# Patient Record
Sex: Female | Born: 1960
Health system: Southern US, Community
[De-identification: ages and names within clinical notes are randomized; demographics above are authoritative.]

## PROBLEM LIST (undated history)

## (undated) DIAGNOSIS — Z95810 Presence of automatic (implantable) cardiac defibrillator: Secondary | ICD-10-CM

## (undated) DIAGNOSIS — I219 Acute myocardial infarction, unspecified: Secondary | ICD-10-CM

## (undated) DIAGNOSIS — E119 Type 2 diabetes mellitus without complications: Secondary | ICD-10-CM

## (undated) DIAGNOSIS — I10 Essential (primary) hypertension: Secondary | ICD-10-CM

## (undated) DIAGNOSIS — M1990 Unspecified osteoarthritis, unspecified site: Secondary | ICD-10-CM

## (undated) DIAGNOSIS — I2510 Atherosclerotic heart disease of native coronary artery without angina pectoris: Secondary | ICD-10-CM

## (undated) DIAGNOSIS — E7800 Pure hypercholesterolemia, unspecified: Secondary | ICD-10-CM

## (undated) DIAGNOSIS — K589 Irritable bowel syndrome without diarrhea: Secondary | ICD-10-CM

## (undated) DIAGNOSIS — G4730 Sleep apnea, unspecified: Secondary | ICD-10-CM

## (undated) DIAGNOSIS — I38 Endocarditis, valve unspecified: Secondary | ICD-10-CM

## (undated) DIAGNOSIS — I999 Unspecified disorder of circulatory system: Secondary | ICD-10-CM

## (undated) DIAGNOSIS — N183 Chronic kidney disease, stage 3 (moderate): Secondary | ICD-10-CM

## (undated) DIAGNOSIS — K219 Gastro-esophageal reflux disease without esophagitis: Secondary | ICD-10-CM

## (undated) DIAGNOSIS — F329 Major depressive disorder, single episode, unspecified: Secondary | ICD-10-CM

## (undated) DIAGNOSIS — I509 Heart failure, unspecified: Secondary | ICD-10-CM

## (undated) DIAGNOSIS — M109 Gout, unspecified: Secondary | ICD-10-CM

## (undated) HISTORY — PX: CARPAL TUNNEL RELEASE: SHX101

## (undated) HISTORY — DX: Unspecified disorder of circulatory system: I99.9

## (undated) HISTORY — PX: SHOULDER SURGERY: SHX246

## (undated) HISTORY — DX: Major depressive disorder, single episode, unspecified: F32.9

## (undated) HISTORY — PX: ABDOMINAL HYSTERECTOMY: SHX81

## (undated) HISTORY — DX: Atherosclerotic heart disease of native coronary artery without angina pectoris: I25.10

## (undated) HISTORY — DX: Endocarditis, valve unspecified: I38

## (undated) HISTORY — PX: HEEL SPUR EXCISION: SHX1733

## (undated) HISTORY — PX: CARDIAC DEFIBRILLATOR PLACEMENT: SHX171

## (undated) HISTORY — DX: Chronic kidney disease, stage 3 (moderate): N18.3

## (undated) HISTORY — DX: Gastro-esophageal reflux disease without esophagitis: K21.9

## (undated) HISTORY — PX: PACEMAKER GENERATOR CHANGE: SHX5998

## (undated) HISTORY — PX: KNEE SURGERY: SHX244

## (undated) HISTORY — DX: Irritable bowel syndrome without diarrhea: K58.9

---

## 2013-02-07 DIAGNOSIS — J309 Allergic rhinitis, unspecified: Secondary | ICD-10-CM | POA: Insufficient documentation

## 2013-02-07 DIAGNOSIS — E739 Lactose intolerance, unspecified: Secondary | ICD-10-CM | POA: Insufficient documentation

## 2013-02-07 DIAGNOSIS — M2550 Pain in unspecified joint: Secondary | ICD-10-CM | POA: Insufficient documentation

## 2013-03-04 DIAGNOSIS — H33109 Unspecified retinoschisis, unspecified eye: Secondary | ICD-10-CM | POA: Insufficient documentation

## 2013-06-03 DIAGNOSIS — I079 Rheumatic tricuspid valve disease, unspecified: Secondary | ICD-10-CM | POA: Insufficient documentation

## 2013-06-03 DIAGNOSIS — M545 Low back pain, unspecified: Secondary | ICD-10-CM | POA: Insufficient documentation

## 2013-06-03 DIAGNOSIS — I38 Endocarditis, valve unspecified: Secondary | ICD-10-CM

## 2013-06-03 DIAGNOSIS — I379 Nonrheumatic pulmonary valve disorder, unspecified: Secondary | ICD-10-CM | POA: Insufficient documentation

## 2013-06-03 DIAGNOSIS — I059 Rheumatic mitral valve disease, unspecified: Secondary | ICD-10-CM | POA: Insufficient documentation

## 2013-06-03 HISTORY — DX: Endocarditis, valve unspecified: I38

## 2013-07-27 DIAGNOSIS — F411 Generalized anxiety disorder: Secondary | ICD-10-CM | POA: Insufficient documentation

## 2013-07-27 DIAGNOSIS — F329 Major depressive disorder, single episode, unspecified: Secondary | ICD-10-CM | POA: Insufficient documentation

## 2013-07-27 DIAGNOSIS — K219 Gastro-esophageal reflux disease without esophagitis: Secondary | ICD-10-CM | POA: Insufficient documentation

## 2013-07-27 DIAGNOSIS — F32A Depression, unspecified: Secondary | ICD-10-CM

## 2013-07-27 HISTORY — DX: Gastro-esophageal reflux disease without esophagitis: K21.9

## 2013-07-27 HISTORY — DX: Depression, unspecified: F32.A

## 2013-09-01 DIAGNOSIS — I2510 Atherosclerotic heart disease of native coronary artery without angina pectoris: Secondary | ICD-10-CM

## 2013-09-01 HISTORY — DX: Atherosclerotic heart disease of native coronary artery without angina pectoris: I25.10

## 2013-10-13 DIAGNOSIS — G4730 Sleep apnea, unspecified: Secondary | ICD-10-CM

## 2013-10-13 HISTORY — DX: Sleep apnea, unspecified: G47.30

## 2013-11-10 DIAGNOSIS — E1169 Type 2 diabetes mellitus with other specified complication: Secondary | ICD-10-CM | POA: Insufficient documentation

## 2013-11-10 DIAGNOSIS — I129 Hypertensive chronic kidney disease with stage 1 through stage 4 chronic kidney disease, or unspecified chronic kidney disease: Secondary | ICD-10-CM | POA: Insufficient documentation

## 2013-11-10 DIAGNOSIS — E7800 Pure hypercholesterolemia, unspecified: Secondary | ICD-10-CM

## 2013-11-10 DIAGNOSIS — E785 Hyperlipidemia, unspecified: Secondary | ICD-10-CM

## 2013-11-10 DIAGNOSIS — N184 Chronic kidney disease, stage 4 (severe): Secondary | ICD-10-CM

## 2013-11-10 DIAGNOSIS — I10 Essential (primary) hypertension: Secondary | ICD-10-CM

## 2013-11-10 HISTORY — DX: Pure hypercholesterolemia, unspecified: E78.00

## 2013-11-10 HISTORY — DX: Essential (primary) hypertension: I10

## 2013-12-09 DIAGNOSIS — N183 Chronic kidney disease, stage 3 unspecified: Secondary | ICD-10-CM

## 2013-12-09 HISTORY — DX: Chronic kidney disease, stage 3 unspecified: N18.30

## 2013-12-26 DIAGNOSIS — L280 Lichen simplex chronicus: Secondary | ICD-10-CM | POA: Insufficient documentation

## 2013-12-29 DIAGNOSIS — M109 Gout, unspecified: Secondary | ICD-10-CM | POA: Insufficient documentation

## 2013-12-29 DIAGNOSIS — D509 Iron deficiency anemia, unspecified: Secondary | ICD-10-CM | POA: Insufficient documentation

## 2013-12-29 HISTORY — DX: Gout, unspecified: M10.9

## 2014-01-12 DIAGNOSIS — Z86718 Personal history of other venous thrombosis and embolism: Secondary | ICD-10-CM | POA: Insufficient documentation

## 2014-01-12 DIAGNOSIS — I739 Peripheral vascular disease, unspecified: Secondary | ICD-10-CM | POA: Insufficient documentation

## 2014-01-12 DIAGNOSIS — I999 Unspecified disorder of circulatory system: Secondary | ICD-10-CM

## 2014-01-12 HISTORY — DX: Unspecified disorder of circulatory system: I99.9

## 2014-09-27 DIAGNOSIS — E119 Type 2 diabetes mellitus without complications: Secondary | ICD-10-CM

## 2014-09-27 DIAGNOSIS — K589 Irritable bowel syndrome without diarrhea: Secondary | ICD-10-CM

## 2014-09-27 HISTORY — DX: Type 2 diabetes mellitus without complications: E11.9

## 2014-09-27 HISTORY — DX: Irritable bowel syndrome, unspecified: K58.9

## 2015-06-07 DIAGNOSIS — I509 Heart failure, unspecified: Secondary | ICD-10-CM

## 2015-06-07 DIAGNOSIS — I429 Cardiomyopathy, unspecified: Secondary | ICD-10-CM | POA: Insufficient documentation

## 2015-06-07 HISTORY — DX: Heart failure, unspecified: I50.9

## 2015-06-08 DIAGNOSIS — B351 Tinea unguium: Secondary | ICD-10-CM | POA: Insufficient documentation

## 2016-01-25 DIAGNOSIS — Z95810 Presence of automatic (implantable) cardiac defibrillator: Secondary | ICD-10-CM | POA: Insufficient documentation

## 2016-01-25 HISTORY — DX: Presence of automatic (implantable) cardiac defibrillator: Z95.810

## 2016-03-07 DIAGNOSIS — F449 Dissociative and conversion disorder, unspecified: Secondary | ICD-10-CM | POA: Insufficient documentation

## 2016-05-31 DIAGNOSIS — I471 Supraventricular tachycardia: Secondary | ICD-10-CM | POA: Insufficient documentation

## 2016-07-03 DIAGNOSIS — L309 Dermatitis, unspecified: Secondary | ICD-10-CM | POA: Insufficient documentation

## 2016-10-17 DIAGNOSIS — F603 Borderline personality disorder: Secondary | ICD-10-CM | POA: Insufficient documentation

## 2016-10-17 DIAGNOSIS — Z96619 Presence of unspecified artificial shoulder joint: Secondary | ICD-10-CM | POA: Insufficient documentation

## 2016-10-17 DIAGNOSIS — G5600 Carpal tunnel syndrome, unspecified upper limb: Secondary | ICD-10-CM | POA: Insufficient documentation

## 2016-11-15 ENCOUNTER — Emergency Department (HOSPITAL_BASED_OUTPATIENT_CLINIC_OR_DEPARTMENT_OTHER): Payer: Medicare Other

## 2016-11-15 ENCOUNTER — Encounter (HOSPITAL_BASED_OUTPATIENT_CLINIC_OR_DEPARTMENT_OTHER): Payer: Self-pay

## 2016-11-15 ENCOUNTER — Emergency Department (HOSPITAL_BASED_OUTPATIENT_CLINIC_OR_DEPARTMENT_OTHER)
Admission: EM | Admit: 2016-11-15 | Discharge: 2016-11-15 | Payer: Medicare Other | Attending: Emergency Medicine | Admitting: Emergency Medicine

## 2016-11-15 DIAGNOSIS — R0602 Shortness of breath: Secondary | ICD-10-CM | POA: Diagnosis present

## 2016-11-15 DIAGNOSIS — I110 Hypertensive heart disease with heart failure: Secondary | ICD-10-CM | POA: Insufficient documentation

## 2016-11-15 DIAGNOSIS — J181 Lobar pneumonia, unspecified organism: Secondary | ICD-10-CM | POA: Insufficient documentation

## 2016-11-15 DIAGNOSIS — J441 Chronic obstructive pulmonary disease with (acute) exacerbation: Secondary | ICD-10-CM | POA: Insufficient documentation

## 2016-11-15 DIAGNOSIS — I214 Non-ST elevation (NSTEMI) myocardial infarction: Secondary | ICD-10-CM | POA: Diagnosis not present

## 2016-11-15 DIAGNOSIS — I5021 Acute systolic (congestive) heart failure: Secondary | ICD-10-CM | POA: Diagnosis not present

## 2016-11-15 DIAGNOSIS — Z87891 Personal history of nicotine dependence: Secondary | ICD-10-CM | POA: Insufficient documentation

## 2016-11-15 DIAGNOSIS — J189 Pneumonia, unspecified organism: Secondary | ICD-10-CM

## 2016-11-15 DIAGNOSIS — E119 Type 2 diabetes mellitus without complications: Secondary | ICD-10-CM | POA: Diagnosis not present

## 2016-11-15 HISTORY — DX: Unspecified osteoarthritis, unspecified site: M19.90

## 2016-11-15 HISTORY — DX: Sleep apnea, unspecified: G47.30

## 2016-11-15 HISTORY — DX: Essential (primary) hypertension: I10

## 2016-11-15 HISTORY — DX: Heart failure, unspecified: I50.9

## 2016-11-15 HISTORY — DX: Pure hypercholesterolemia, unspecified: E78.00

## 2016-11-15 HISTORY — DX: Type 2 diabetes mellitus without complications: E11.9

## 2016-11-15 HISTORY — DX: Acute myocardial infarction, unspecified: I21.9

## 2016-11-15 LAB — CBC WITH DIFFERENTIAL/PLATELET
BASOS ABS: 0 10*3/uL (ref 0.0–0.1)
Basophils Relative: 0 %
EOS ABS: 0.1 10*3/uL (ref 0.0–0.7)
Eosinophils Relative: 2 %
HCT: 35.8 % — ABNORMAL LOW (ref 36.0–46.0)
Hemoglobin: 11.8 g/dL — ABNORMAL LOW (ref 12.0–15.0)
Lymphocytes Relative: 25 %
Lymphs Abs: 1.8 10*3/uL (ref 0.7–4.0)
MCH: 30.6 pg (ref 26.0–34.0)
MCHC: 33 g/dL (ref 30.0–36.0)
MCV: 92.7 fL (ref 78.0–100.0)
Monocytes Absolute: 0.6 10*3/uL (ref 0.1–1.0)
Monocytes Relative: 8 %
Neutro Abs: 4.5 10*3/uL (ref 1.7–7.7)
Neutrophils Relative %: 65 %
PLATELETS: 267 10*3/uL (ref 150–400)
RBC: 3.86 MIL/uL — AB (ref 3.87–5.11)
RDW: 15.2 % (ref 11.5–15.5)
WBC: 7 10*3/uL (ref 4.0–10.5)

## 2016-11-15 LAB — COMPREHENSIVE METABOLIC PANEL
ALT: 15 U/L (ref 14–54)
AST: 22 U/L (ref 15–41)
Albumin: 3.8 g/dL (ref 3.5–5.0)
Alkaline Phosphatase: 85 U/L (ref 38–126)
Anion gap: 10 (ref 5–15)
BILIRUBIN TOTAL: 0.5 mg/dL (ref 0.3–1.2)
BUN: 18 mg/dL (ref 6–20)
CO2: 23 mmol/L (ref 22–32)
CREATININE: 1.4 mg/dL — AB (ref 0.44–1.00)
Calcium: 9.2 mg/dL (ref 8.9–10.3)
Chloride: 105 mmol/L (ref 101–111)
GFR calc Af Amer: 47 mL/min — ABNORMAL LOW (ref >60)
GFR, EST NON AFRICAN AMERICAN: 41 mL/min — AB (ref >60)
Glucose, Bld: 126 mg/dL — ABNORMAL HIGH (ref 65–99)
POTASSIUM: 3.6 mmol/L (ref 3.5–5.1)
Sodium: 138 mmol/L (ref 135–145)
TOTAL PROTEIN: 7.6 g/dL (ref 6.5–8.1)

## 2016-11-15 LAB — TROPONIN I: TROPONIN I: 0.04 ng/mL — AB (ref <0.03)

## 2016-11-15 LAB — BRAIN NATRIURETIC PEPTIDE: B NATRIURETIC PEPTIDE 5: 451 pg/mL — AB (ref 0.0–100.0)

## 2016-11-15 LAB — D-DIMER, QUANTITATIVE: D-Dimer, Quant: 1.42 ug/mL-FEU — ABNORMAL HIGH (ref 0.00–0.50)

## 2016-11-15 MED ORDER — AZITHROMYCIN 500 MG IV SOLR: 500.0000 mg | Freq: Once | INTRAVENOUS | Status: DC

## 2016-11-15 MED ORDER — AZITHROMYCIN 500 MG IV SOLR
INTRAVENOUS | Status: AC
  Administered 2016-11-15: 500 mg
  Filled 2016-11-15: qty 500

## 2016-11-15 MED ORDER — ALBUTEROL SULFATE (2.5 MG/3ML) 0.083% IN NEBU
5.0000 mg | INHALATION_SOLUTION | Freq: Once | RESPIRATORY_TRACT | Status: AC
  Administered 2016-11-15: 5 mg via RESPIRATORY_TRACT
  Filled 2016-11-15: qty 6

## 2016-11-15 MED ORDER — IOPAMIDOL (ISOVUE-370) INJECTION 76%
100.0000 mL | Freq: Once | INTRAVENOUS | Status: AC | PRN
  Administered 2016-11-15: 80 mL via INTRAVENOUS

## 2016-11-15 MED ORDER — CEFTRIAXONE SODIUM 1 G IJ SOLR
1.0000 g | Freq: Once | INTRAMUSCULAR | Status: AC
  Administered 2016-11-15: 1 g via INTRAVENOUS
  Filled 2016-11-15: qty 10

## 2016-11-15 NOTE — ED Notes (Signed)
O2 applied at 2L [place].

## 2016-11-15 NOTE — ED Triage Notes (Addendum)
Pt presents to triage with fast paced speech, anxious-c/o SOB x 2 months-worse x 2 days-RT in triage to assess pt-pt was encouraged multiple times to give only the information to answer direct ?s to save her breath-pt appears in no resp distress at this as evidenced by her ability for nonstop speech

## 2016-11-15 NOTE — ED Notes (Signed)
Dr. Tamera Punt and pt nurse Vincente Liberty notified of troponin 0.04.

## 2016-11-15 NOTE — ED Notes (Signed)
[facility] is calling back with a bed number for transfer, there.

## 2016-11-15 NOTE — ED Notes (Signed)
Called hospitalist direct for [facility] ---call back to [phone]

## 2016-11-15 NOTE — ED Provider Notes (Signed)
[place] DEPT [facility] Provider Note   CSN: [id number] Arrival date & time: 11/15/16  1319     History   Chief Complaint Chief Complaint  Patient presents with  . Shortness of Breath    HPI Armonie Mettler is a 56 y.o. female.  Patient is a 56 year old female with a history of diabetes, CHF, hyperlipidemia and hypertension who presents with shortness of breath. She reports that she's had worsening shortness breath over the last 2 months. She's had increasing fatigue. She gets short of breath walking short distances. She feels like her abdomen is swollen but denies abdominal pain. Over the last 2 days she's had some nasal congestion and a cough which is mildly productive of white sputum. She denies any known fevers. No nausea vomiting or diarrhea. No increase in her leg swelling. She states when she weighed herself today she was about 3 pounds heavier than she had been. Her cardiologist is in [facility]. She states she previously using nebulizer machine at home but hasn't used it in a couple of years. She also has a prior history of DVT but is not currently on Coumadin.      Past Medical History:  Diagnosis Date  . Arthritis   . CHF (congestive heart failure) ([place])   . Diabetes mellitus without complication ([place])   . High cholesterol   . Hypertension   . Myocardial infarct   . Sleep apnea     There are no active problems to display for this patient.   Past Surgical History:  Procedure Laterality Date  . ABDOMINAL HYSTERECTOMY    . CARDIAC DEFIBRILLATOR PLACEMENT    . CARPAL TUNNEL RELEASE    . HEEL SPUR EXCISION    . KNEE SURGERY    . PACEMAKER GENERATOR CHANGE    . SHOULDER SURGERY      OB History    No data available       Home Medications    Prior to Admission medications   Not on File    Family History No family history on file.  Social History Social History  Substance Use Topics  . Smoking status: Former Research scientist (life sciences)  . Smokeless tobacco: Never Used   . Alcohol use No     Allergies   Patient has no allergy information on record.   Review of Systems Review of Systems  Constitutional: Positive for fatigue. Negative for chills, diaphoresis and fever.  HENT: Positive for congestion and rhinorrhea. Negative for sneezing.   Eyes: Negative.   Respiratory: Positive for cough and shortness of breath. Negative for chest tightness.   Cardiovascular: Negative for chest pain and leg swelling.  Gastrointestinal: Negative for abdominal pain, blood in stool, diarrhea, nausea and vomiting.  Genitourinary: Negative for difficulty urinating, flank pain, frequency and hematuria.  Musculoskeletal: Negative for arthralgias and back pain.  Skin: Negative for rash.  Neurological: Negative for dizziness, speech difficulty, weakness, numbness and headaches.     Physical Exam Updated Vital Signs BP (!) 143/88   Pulse 75   Temp 98.3 F (36.8 C) (Oral)   Resp 17   SpO2 96%   Physical Exam  Constitutional: She is oriented to person, place, and time. She appears well-developed and well-nourished.  HENT:  Head: Normocephalic and atraumatic.  Eyes: Pupils are equal, round, and reactive to light.  Neck: Normal range of motion. Neck supple.  Cardiovascular: Regular rhythm and normal heart sounds.  Tachycardia present.   Pulmonary/Chest: Effort normal. Tachypnea noted. No respiratory distress. She has wheezes.  She has rales. She exhibits no tenderness.  Abdominal: Soft. Bowel sounds are normal. There is no tenderness. There is no rebound and no guarding.  Musculoskeletal: Normal range of motion. She exhibits no edema.  Lymphadenopathy:    She has no cervical adenopathy.  Neurological: She is alert and oriented to person, place, and time.  Skin: Skin is warm and dry. No rash noted.  Psychiatric: She has a normal mood and affect.     ED Treatments / Results  Labs (all labs ordered are listed, but only abnormal results are displayed) Labs Reviewed   COMPREHENSIVE METABOLIC PANEL - Abnormal; Notable for the following:       Result Value   Glucose, Bld 126 (*)    Creatinine, Ser 1.40 (*)    GFR calc non Af Amer 41 (*)    GFR calc Af Amer 47 (*)    All other components within normal limits  CBC WITH DIFFERENTIAL/PLATELET - Abnormal; Notable for the following:    RBC 3.86 (*)    Hemoglobin 11.8 (*)    HCT 35.8 (*)    All other components within normal limits  TROPONIN I - Abnormal; Notable for the following:    Troponin I 0.04 (*)    All other components within normal limits  BRAIN NATRIURETIC PEPTIDE - Abnormal; Notable for the following:    B Natriuretic Peptide 451.0 (*)    All other components within normal limits  D-DIMER, QUANTITATIVE (NOT AT [facility]) - Abnormal; Notable for the following:    D-Dimer, Quant 1.42 (*)    All other components within normal limits    EKG  EKG Interpretation  Date/Time:  Thursday November 15 2016 13:42:27 EDT Ventricular Rate:  101 PR Interval:    QRS Duration: 137 QT Interval:  402 QTC Calculation: 522 R Axis:   -34 Text Interpretation:  Atrial-sensed ventricular-paced rhythm No further analysis attempted due to paced rhythm No old tracing to compare Confirmed by Chyanne Kohut  MD, Raynah Gomes ([id number]) on 11/15/2016 1:47:31 PM       Radiology Dg Chest 2 View  Result Date: 11/15/2016 CLINICAL DATA:  Shortness of breath for the past several days. History of CHF. Former smoker. EXAM: CHEST  2 VIEW COMPARISON:  None in PACs FINDINGS: The lungs are adequately inflated. There are patchy alveolar opacities in the right mid and lower lung. The left lung is grossly clear but a portion is obscured by the ICD generator. There is no pleural effusion. The cardiac silhouette is enlarged. The central pulmonary vascularity is prominent and there is mild cephalization of the vascular pattern. The ICD is in reasonable position. The bony thorax exhibits no acute abnormality. There are prosthetic shoulder joints bilaterally.  IMPRESSION: Patchy alveolar opacities in the right mid and lower lung worrisome for pneumonia. Probable low-grade CHF. Followup PA and lateral chest X-ray is recommended in 3-4 weeks following trial of antibiotic therapy to ensure resolution and exclude underlying malignancy. Electronically Signed   By: David  Martinique M.D.   On: 11/15/2016 14:43    Procedures Procedures (including critical care time)  Medications Ordered in ED Medications  cefTRIAXone (ROCEPHIN) 1 g in dextrose 5 % 50 mL IVPB (not administered)  azithromycin (ZITHROMAX) 500 mg in dextrose 5 % 250 mL IVPB (not administered)  azithromycin (ZITHROMAX) 500 MG injection (not administered)  albuterol (PROVENTIL) (2.5 MG/3ML) 0.083% nebulizer solution 5 mg (5 mg Nebulization Given 11/15/16 1409)     Initial Impression / Assessment and Plan / ED Course  I have reviewed the triage vital signs and the nursing notes.  Pertinent labs & imaging results that were available during my care of the patient were reviewed by me and considered in my medical decision making (see chart for details).     PT presents with SOB Over last 2 months but worsening over last few days. She's also had some worsening cough and URI symptoms over the last few days. Chest x-ray shows bilateral patchy infiltrates. She has no recent hospitalizations. She was treated with Rocephin and Zithromax in the ED. She also is given nebulizer treatment and her breathing seems to be much improved after the nebulizer treatment. She has a mild oxygen requirement. She does not report any chest pain. She doesn't have any visible ischemic changes on EKG although she does have a paced rhythm. Her troponin is mildly bumps. Her d-dimer is elevated and she is currently waiting CT scan.  She will need admission for further tx.  PT requesting [place].  Dr. Rex Kras to f/u on CT and likely admit.  Final Clinical Impressions(s) / ED Diagnoses   Final diagnoses:  Community acquired pneumonia of  left lung, unspecified part of lung  Community acquired pneumonia of right lower lobe of lung (HCC)  COPD exacerbation ([place])  Acute systolic congestive heart failure (HCC)  NSTEMI (non-ST elevated myocardial infarction) [facility])    New Prescriptions New Prescriptions   No medications on file     Malvin Johns, MD 11/15/16 1614

## 2016-11-15 NOTE — ED Notes (Signed)
Discussed allergy to PCN w/ EDP; EDP wants to proceed w/ Rocephin d/t no hx of anaphylaxis

## 2016-11-15 NOTE — ED Notes (Signed)
Updated pt on status of transfer; still no truck available. Soup provided to eat. Pt requests to try no O2 to see how she does. O2 removed at this time.

## 2016-11-15 NOTE — ED Notes (Signed)
[facility] can transport sometime after 7PM to [facility]

## 2016-11-15 NOTE — ED Notes (Signed)
[facility] transport not available until after 7p.

## 2016-11-16 DIAGNOSIS — E119 Type 2 diabetes mellitus without complications: Secondary | ICD-10-CM | POA: Insufficient documentation

## 2017-02-08 DIAGNOSIS — M11261 Other chondrocalcinosis, right knee: Secondary | ICD-10-CM | POA: Insufficient documentation

## 2017-02-08 DIAGNOSIS — M170 Bilateral primary osteoarthritis of knee: Secondary | ICD-10-CM | POA: Insufficient documentation

## 2017-02-08 DIAGNOSIS — M1990 Unspecified osteoarthritis, unspecified site: Secondary | ICD-10-CM

## 2017-02-08 DIAGNOSIS — M11262 Other chondrocalcinosis, left knee: Secondary | ICD-10-CM

## 2017-02-08 HISTORY — DX: Unspecified osteoarthritis, unspecified site: M19.90

## 2017-02-09 ENCOUNTER — Observation Stay (HOSPITAL_COMMUNITY): Payer: Medicare Other

## 2017-02-09 ENCOUNTER — Encounter (HOSPITAL_BASED_OUTPATIENT_CLINIC_OR_DEPARTMENT_OTHER): Payer: Self-pay | Admitting: Emergency Medicine

## 2017-02-09 ENCOUNTER — Emergency Department (HOSPITAL_BASED_OUTPATIENT_CLINIC_OR_DEPARTMENT_OTHER): Payer: Medicare Other

## 2017-02-09 ENCOUNTER — Inpatient Hospital Stay (HOSPITAL_BASED_OUTPATIENT_CLINIC_OR_DEPARTMENT_OTHER)
Admission: EM | Admit: 2017-02-09 | Discharge: 2017-02-15 | DRG: 286 | Disposition: A | Discharge status: Home / Self Care | Payer: Medicare Other | Attending: Family Medicine | Admitting: Family Medicine

## 2017-02-09 DIAGNOSIS — M109 Gout, unspecified: Secondary | ICD-10-CM | POA: Diagnosis present

## 2017-02-09 DIAGNOSIS — I130 Hypertensive heart and chronic kidney disease with heart failure and stage 1 through stage 4 chronic kidney disease, or unspecified chronic kidney disease: Secondary | ICD-10-CM | POA: Diagnosis not present

## 2017-02-09 DIAGNOSIS — K219 Gastro-esophageal reflux disease without esophagitis: Secondary | ICD-10-CM | POA: Diagnosis present

## 2017-02-09 DIAGNOSIS — I10 Essential (primary) hypertension: Secondary | ICD-10-CM | POA: Diagnosis not present

## 2017-02-09 DIAGNOSIS — R0989 Other specified symptoms and signs involving the circulatory and respiratory systems: Secondary | ICD-10-CM | POA: Diagnosis not present

## 2017-02-09 DIAGNOSIS — Z95810 Presence of automatic (implantable) cardiac defibrillator: Secondary | ICD-10-CM

## 2017-02-09 DIAGNOSIS — I252 Old myocardial infarction: Secondary | ICD-10-CM

## 2017-02-09 DIAGNOSIS — E785 Hyperlipidemia, unspecified: Secondary | ICD-10-CM | POA: Diagnosis present

## 2017-02-09 DIAGNOSIS — Z79899 Other long term (current) drug therapy: Secondary | ICD-10-CM

## 2017-02-09 DIAGNOSIS — G4730 Sleep apnea, unspecified: Secondary | ICD-10-CM | POA: Diagnosis present

## 2017-02-09 DIAGNOSIS — G894 Chronic pain syndrome: Secondary | ICD-10-CM | POA: Diagnosis present

## 2017-02-09 DIAGNOSIS — D509 Iron deficiency anemia, unspecified: Secondary | ICD-10-CM | POA: Diagnosis present

## 2017-02-09 DIAGNOSIS — E1165 Type 2 diabetes mellitus with hyperglycemia: Secondary | ICD-10-CM | POA: Diagnosis present

## 2017-02-09 DIAGNOSIS — Z87891 Personal history of nicotine dependence: Secondary | ICD-10-CM

## 2017-02-09 DIAGNOSIS — I509 Heart failure, unspecified: Secondary | ICD-10-CM

## 2017-02-09 DIAGNOSIS — I429 Cardiomyopathy, unspecified: Secondary | ICD-10-CM | POA: Diagnosis present

## 2017-02-09 DIAGNOSIS — F39 Unspecified mood [affective] disorder: Secondary | ICD-10-CM | POA: Diagnosis present

## 2017-02-09 DIAGNOSIS — R778 Other specified abnormalities of plasma proteins: Secondary | ICD-10-CM

## 2017-02-09 DIAGNOSIS — N179 Acute kidney failure, unspecified: Secondary | ICD-10-CM | POA: Diagnosis present

## 2017-02-09 DIAGNOSIS — E1122 Type 2 diabetes mellitus with diabetic chronic kidney disease: Secondary | ICD-10-CM | POA: Diagnosis present

## 2017-02-09 DIAGNOSIS — R748 Abnormal levels of other serum enzymes: Secondary | ICD-10-CM | POA: Diagnosis not present

## 2017-02-09 DIAGNOSIS — K5900 Constipation, unspecified: Secondary | ICD-10-CM

## 2017-02-09 DIAGNOSIS — E876 Hypokalemia: Secondary | ICD-10-CM | POA: Diagnosis present

## 2017-02-09 DIAGNOSIS — N183 Chronic kidney disease, stage 3 (moderate): Secondary | ICD-10-CM | POA: Diagnosis present

## 2017-02-09 DIAGNOSIS — R0600 Dyspnea, unspecified: Secondary | ICD-10-CM

## 2017-02-09 DIAGNOSIS — Z880 Allergy status to penicillin: Secondary | ICD-10-CM

## 2017-02-09 DIAGNOSIS — G4733 Obstructive sleep apnea (adult) (pediatric): Secondary | ICD-10-CM | POA: Diagnosis present

## 2017-02-09 DIAGNOSIS — I5023 Acute on chronic systolic (congestive) heart failure: Secondary | ICD-10-CM

## 2017-02-09 DIAGNOSIS — R7989 Other specified abnormal findings of blood chemistry: Secondary | ICD-10-CM

## 2017-02-09 DIAGNOSIS — Z888 Allergy status to other drugs, medicaments and biological substances status: Secondary | ICD-10-CM

## 2017-02-09 DIAGNOSIS — G2581 Restless legs syndrome: Secondary | ICD-10-CM | POA: Diagnosis present

## 2017-02-09 HISTORY — DX: Presence of automatic (implantable) cardiac defibrillator: Z95.810

## 2017-02-09 HISTORY — DX: Gout, unspecified: M10.9

## 2017-02-09 LAB — HEPATIC FUNCTION PANEL
ALK PHOS: 67 U/L (ref 38–126)
ALT: 16 U/L (ref 14–54)
AST: 24 U/L (ref 15–41)
Albumin: 3.7 g/dL (ref 3.5–5.0)
Bilirubin, Direct: <0.1 mg/dL — ABNORMAL LOW (ref 0.1–0.5)
TOTAL PROTEIN: 7.3 g/dL (ref 6.5–8.1)
Total Bilirubin: 0.7 mg/dL (ref 0.3–1.2)

## 2017-02-09 LAB — LIPASE, BLOOD: LIPASE: 18 U/L (ref 11–51)

## 2017-02-09 LAB — BASIC METABOLIC PANEL
ANION GAP: 8 (ref 5–15)
BUN: 16 mg/dL (ref 6–20)
CALCIUM: 9 mg/dL (ref 8.9–10.3)
CO2: 25 mmol/L (ref 22–32)
Chloride: 106 mmol/L (ref 101–111)
Creatinine, Ser: 1.44 mg/dL — ABNORMAL HIGH (ref 0.44–1.00)
GFR, EST AFRICAN AMERICAN: 45 mL/min — AB (ref >60)
GFR, EST NON AFRICAN AMERICAN: 39 mL/min — AB (ref >60)
Glucose, Bld: 136 mg/dL — ABNORMAL HIGH (ref 65–99)
Potassium: 3.9 mmol/L (ref 3.5–5.1)
SODIUM: 139 mmol/L (ref 135–145)

## 2017-02-09 LAB — TROPONIN I
TROPONIN I: 0.04 ng/mL — AB (ref <0.03)
TROPONIN I: 0.04 ng/mL — AB (ref <0.03)
TROPONIN I: 0.04 ng/mL — AB (ref <0.03)

## 2017-02-09 LAB — CBC WITH DIFFERENTIAL/PLATELET
BASOS PCT: 0 %
Basophils Absolute: 0 10*3/uL (ref 0.0–0.1)
EOS PCT: 3 %
Eosinophils Absolute: 0.1 10*3/uL (ref 0.0–0.7)
HCT: 31.8 % — ABNORMAL LOW (ref 36.0–46.0)
Hemoglobin: 10.6 g/dL — ABNORMAL LOW (ref 12.0–15.0)
Lymphocytes Relative: 20 %
Lymphs Abs: 1 10*3/uL (ref 0.7–4.0)
MCH: 31.1 pg (ref 26.0–34.0)
MCHC: 33.3 g/dL (ref 30.0–36.0)
MCV: 93.3 fL (ref 78.0–100.0)
MONO ABS: 0.4 10*3/uL (ref 0.1–1.0)
Monocytes Relative: 7 %
NEUTROS ABS: 3.6 10*3/uL (ref 1.7–7.7)
Neutrophils Relative %: 70 %
PLATELETS: 289 10*3/uL (ref 150–400)
RBC: 3.41 MIL/uL — AB (ref 3.87–5.11)
RDW: 15.7 % — AB (ref 11.5–15.5)
WBC: 5.1 10*3/uL (ref 4.0–10.5)

## 2017-02-09 LAB — CBC
HEMATOCRIT: 33.6 % — AB (ref 36.0–46.0)
HEMOGLOBIN: 10.8 g/dL — AB (ref 12.0–15.0)
MCH: 29.9 pg (ref 26.0–34.0)
MCHC: 32.1 g/dL (ref 30.0–36.0)
MCV: 93.1 fL (ref 78.0–100.0)
Platelets: 315 10*3/uL (ref 150–400)
RBC: 3.61 MIL/uL — AB (ref 3.87–5.11)
RDW: 16.1 % — ABNORMAL HIGH (ref 11.5–15.5)
WBC: 6.6 10*3/uL (ref 4.0–10.5)

## 2017-02-09 LAB — GLUCOSE, CAPILLARY
GLUCOSE-CAPILLARY: 111 mg/dL — AB (ref 65–99)
Glucose-Capillary: 132 mg/dL — ABNORMAL HIGH (ref 65–99)

## 2017-02-09 LAB — CREATININE, SERUM
CREATININE: 1.38 mg/dL — AB (ref 0.44–1.00)
GFR calc Af Amer: 48 mL/min — ABNORMAL LOW (ref >60)
GFR, EST NON AFRICAN AMERICAN: 41 mL/min — AB (ref >60)

## 2017-02-09 LAB — BRAIN NATRIURETIC PEPTIDE: B NATRIURETIC PEPTIDE 5: 634.3 pg/mL — AB (ref 0.0–100.0)

## 2017-02-09 LAB — MAGNESIUM: MAGNESIUM: 1.8 mg/dL (ref 1.7–2.4)

## 2017-02-09 MED ORDER — ROPINIROLE HCL 0.5 MG PO TABS
0.5000 mg | ORAL_TABLET | Freq: Every day | ORAL | Status: DC
  Administered 2017-02-09 – 2017-02-14 (×6): 0.5 mg via ORAL
  Filled 2017-02-09 (×7): qty 1

## 2017-02-09 MED ORDER — SIMVASTATIN 20 MG PO TABS
20.0000 mg | ORAL_TABLET | Freq: Every day | ORAL | Status: DC
  Administered 2017-02-09 – 2017-02-14 (×5): 20 mg via ORAL
  Filled 2017-02-09 (×7): qty 1

## 2017-02-09 MED ORDER — INSULIN ASPART 100 UNIT/ML ~~LOC~~ SOLN
0.0000 [IU] | Freq: Three times a day (TID) | SUBCUTANEOUS | Status: DC
  Administered 2017-02-10: 2 [IU] via SUBCUTANEOUS
  Administered 2017-02-10: 3 [IU] via SUBCUTANEOUS
  Administered 2017-02-11 – 2017-02-12 (×4): 2 [IU] via SUBCUTANEOUS
  Administered 2017-02-12: 3 [IU] via SUBCUTANEOUS
  Administered 2017-02-13: 2 [IU] via SUBCUTANEOUS
  Administered 2017-02-14: 8 [IU] via SUBCUTANEOUS
  Administered 2017-02-15: 5 [IU] via SUBCUTANEOUS

## 2017-02-09 MED ORDER — SERTRALINE HCL 100 MG PO TABS
150.0000 mg | ORAL_TABLET | Freq: Every day | ORAL | Status: DC
  Administered 2017-02-09 – 2017-02-15 (×7): 150 mg via ORAL
  Filled 2017-02-09 (×7): qty 1

## 2017-02-09 MED ORDER — SODIUM CHLORIDE 0.9% FLUSH
3.0000 mL | INTRAVENOUS | Status: DC | PRN
  Administered 2017-02-13: 3 mL via INTRAVENOUS
  Filled 2017-02-09: qty 3

## 2017-02-09 MED ORDER — ASPIRIN 81 MG PO CHEW
324.0000 mg | CHEWABLE_TABLET | Freq: Once | ORAL | Status: AC
  Administered 2017-02-09: 324 mg via ORAL
  Filled 2017-02-09: qty 4

## 2017-02-09 MED ORDER — SODIUM CHLORIDE 0.9% FLUSH
3.0000 mL | Freq: Two times a day (BID) | INTRAVENOUS | Status: DC
  Administered 2017-02-09 – 2017-02-13 (×8): 3 mL via INTRAVENOUS

## 2017-02-09 MED ORDER — SPIRONOLACTONE 25 MG PO TABS
25.0000 mg | ORAL_TABLET | Freq: Every day | ORAL | Status: DC
  Administered 2017-02-09 – 2017-02-14 (×6): 25 mg via ORAL
  Filled 2017-02-09 (×6): qty 1

## 2017-02-09 MED ORDER — HYDRALAZINE HCL 20 MG/ML IJ SOLN
10.0000 mg | Freq: Three times a day (TID) | INTRAMUSCULAR | Status: DC | PRN
  Filled 2017-02-09: qty 1

## 2017-02-09 MED ORDER — CARVEDILOL 25 MG PO TABS
25.0000 mg | ORAL_TABLET | Freq: Two times a day (BID) | ORAL | Status: DC
  Administered 2017-02-10 – 2017-02-14 (×8): 25 mg via ORAL
  Filled 2017-02-09 (×10): qty 1

## 2017-02-09 MED ORDER — SODIUM CHLORIDE 0.9 % IV SOLN: 250.0000 mL | INTRAVENOUS | Status: DC | PRN

## 2017-02-09 MED ORDER — AMIODARONE HCL 200 MG PO TABS
200.0000 mg | ORAL_TABLET | Freq: Every day | ORAL | Status: DC
Start: 2017-02-09 — End: 2017-02-15
  Administered 2017-02-09 – 2017-02-15 (×7): 200 mg via ORAL
  Filled 2017-02-09 (×7): qty 1

## 2017-02-09 MED ORDER — ONDANSETRON HCL 4 MG/2ML IJ SOLN: 4.0000 mg | Freq: Four times a day (QID) | INTRAMUSCULAR | Status: DC | PRN

## 2017-02-09 MED ORDER — FUROSEMIDE 10 MG/ML IJ SOLN
60.0000 mg | Freq: Two times a day (BID) | INTRAMUSCULAR | Status: AC
  Administered 2017-02-09 – 2017-02-10 (×2): 60 mg via INTRAVENOUS
  Filled 2017-02-09 (×2): qty 6

## 2017-02-09 MED ORDER — FUROSEMIDE 10 MG/ML IJ SOLN
40.0000 mg | INTRAMUSCULAR | Status: AC
  Administered 2017-02-09: 40 mg via INTRAVENOUS
  Filled 2017-02-09: qty 4

## 2017-02-09 MED ORDER — PANTOPRAZOLE SODIUM 40 MG PO TBEC
40.0000 mg | DELAYED_RELEASE_TABLET | Freq: Every day | ORAL | Status: DC
  Administered 2017-02-09 – 2017-02-15 (×7): 40 mg via ORAL
  Filled 2017-02-09 (×7): qty 1

## 2017-02-09 MED ORDER — HYDRALAZINE HCL 25 MG PO TABS
12.5000 mg | ORAL_TABLET | Freq: Every day | ORAL | Status: DC
  Administered 2017-02-09 – 2017-02-10 (×2): 12.5 mg via ORAL
  Filled 2017-02-09 (×2): qty 1

## 2017-02-09 MED ORDER — ENOXAPARIN SODIUM 40 MG/0.4ML ~~LOC~~ SOLN
40.0000 mg | SUBCUTANEOUS | Status: DC
  Administered 2017-02-09 – 2017-02-12 (×4): 40 mg via SUBCUTANEOUS
  Filled 2017-02-09 (×7): qty 0.4

## 2017-02-09 MED ORDER — ACETAMINOPHEN 325 MG PO TABS: 650.0000 mg | ORAL_TABLET | ORAL | Status: DC | PRN

## 2017-02-09 MED ORDER — POTASSIUM CHLORIDE CRYS ER 10 MEQ PO TBCR
10.0000 meq | EXTENDED_RELEASE_TABLET | ORAL | Status: DC
  Administered 2017-02-09: 10 meq via ORAL
  Filled 2017-02-09: qty 1

## 2017-02-09 NOTE — ED Provider Notes (Signed)
[place] DEPT [facility] Provider Note   CSN: [id number] Arrival date & time: 02/09/17  1115     History   Chief Complaint Chief Complaint  Patient presents with  . Abdominal Pain  . Shortness of Breath    HPI Michelle Andrade is a 56 y.o. female.  The history is provided by the patient and medical records.    56 year old F with hx of CHF, arthritis, HTN, HLP, gout, sleep apnea, DM, presenting to the ED for SOB And epigastric abdominal pain.  Patient reports she has had a hiatal hernia for several years now. States feels it is getting larger. States she has met with her physician at the [place] who has arranged for her to see a Psychologist, sport and exercise, however states sheis not in physical shape to undergo operative repair at this time. Reports over the past 2 days she has become increasingly short of breath. States since her diagnosis of CHF she sleeps in a recliner nightly. States she is able to recline back somewhat, but has not been able to lay flat since diagnosis. States she does have history of sleep apnea, but has had trouble using her CPAP recently. She has been having some nasal congestion, productive cough with white, frothy sputum. She denies any chest pain. She's not had any fever to her knowledge, but does have hot flashes so states it is hard to distinguish between the 2. States she's been taking her diuretics as prescribed. States she's been checking her weight, was 177 earlier in the week, 178 this morning. States she just recently got over bronchitis.  She's not had any trouble eating or drinking. She is not had any vomiting or diarrhea.  Past Medical History:  Diagnosis Date  . Arthritis   . CHF (congestive heart failure) ([place])   . Diabetes mellitus without complication ([place])   . Gout   . High cholesterol   . Hypertension   . Myocardial infarct ([place])   . Sleep apnea     There are no active problems to display for this patient.   Past Surgical History:  Procedure Laterality Date  .  ABDOMINAL HYSTERECTOMY    . CARDIAC DEFIBRILLATOR PLACEMENT    . CARPAL TUNNEL RELEASE    . HEEL SPUR EXCISION    . KNEE SURGERY    . PACEMAKER GENERATOR CHANGE    . SHOULDER SURGERY      OB History    No data available       Home Medications    Prior to Admission medications   Medication Sig Start Date End Date Taking? Authorizing Provider  amiodarone (PACERONE) 200 MG tablet Take 200 mg by mouth daily.   Yes [provider]  carvedilol (COREG) 25 MG tablet Take 25 mg by mouth 2 (two) times daily with a meal.   Yes [provider]  ferrous sulfate 325 (65 FE) MG tablet Take 325 mg by mouth daily with breakfast.   Yes [provider]  hydrALAZINE (APRESOLINE) 25 MG tablet Take 25 mg by mouth 3 (three) times daily.   Yes [provider]  loratadine (CLARITIN) 10 MG tablet Take 10 mg by mouth daily.   Yes [provider]  metFORMIN (GLUCOPHAGE) 500 MG tablet Take by mouth 2 (two) times daily with a meal.   Yes [provider]  omeprazole (PRILOSEC) 20 MG capsule Take 20 mg by mouth daily.   Yes [provider]  potassium chloride (K-DUR,KLOR-CON) 10 MEQ tablet Take 10 mEq by mouth 2 (  two) times daily.   Yes [provider]  rOPINIRole (REQUIP) 0.5 MG tablet Take 0.5 mg by mouth 3 (three) times daily.   Yes [provider]  sertraline (ZOLOFT) 50 MG tablet Take 50 mg by mouth daily.   Yes [provider]  simvastatin (ZOCOR) 20 MG tablet Take 20 mg by mouth daily.   Yes [provider]  spironolactone (ALDACTONE) 25 MG tablet Take 25 mg by mouth daily.   Yes [provider]  torsemide (DEMADEX) 20 MG tablet Take 20 mg by mouth daily.   Yes [provider]    Family History No family history on file.  Social History Social History  Substance Use Topics  . Smoking status: Former Research scientist (life sciences)  . Smokeless tobacco: Never Used  . Alcohol use No     Allergies   Codeine;  Gabapentin; Haldol [haloperidol lactate]; Lisinopril; Losartan; Morphine and related; and Penicillins   Review of Systems Review of Systems  Respiratory: Positive for cough and shortness of breath.   Gastrointestinal: Positive for abdominal pain.  All other systems reviewed and are negative.    Physical Exam Updated Vital Signs BP (!) 161/113 (BP Location: Left Arm)   Pulse (!) 110   Temp 98.8 F (37.1 C) (Oral)   Resp (!) 28   Ht 5' 3"  (1.6 m)   Wt 80.7 kg (178 lb)   SpO2 96%   BMI 31.53 kg/m   Physical Exam  Constitutional: She is oriented to person, place, and time. She appears well-developed and well-nourished.  HENT:  Head: Normocephalic and atraumatic.  Mouth/Throat: Oropharynx is clear and moist.  Voice does sounds somewhat hoarse but HEENT WNL  Eyes: Conjunctivae and EOM are normal. Pupils are equal, round, and reactive to light.  Neck: Normal range of motion.  Cardiovascular: Normal rate, regular rhythm and normal heart sounds.   Pulmonary/Chest: Effort normal and breath sounds normal. No respiratory distress. She has no wheezes.  No wheezes, rhonchi, or rales noted; patient is speaking in paragraphs without issue  Abdominal: Soft. Bowel sounds are normal. There is no tenderness. There is no rebound.  Musculoskeletal: Normal range of motion.  No significant pitting edema of the lower extremities  Neurological: She is alert and oriented to person, place, and time.  Skin: Skin is warm and dry.  Psychiatric: She has a normal mood and affect.  Nursing note and vitals reviewed.    ED Treatments / Results  Labs (all labs ordered are listed, but only abnormal results are displayed) Labs Reviewed  CBC WITH DIFFERENTIAL/PLATELET - Abnormal; Notable for the following:       Result Value   RBC 3.41 (*)    Hemoglobin 10.6 (*)    HCT 31.8 (*)    RDW 15.7 (*)    All other components within normal limits  BASIC METABOLIC PANEL - Abnormal; Notable for the following:      Glucose, Bld 136 (*)    Creatinine, Ser 1.44 (*)    GFR calc non Af Amer 39 (*)    GFR calc Af Amer 45 (*)    All other components within normal limits  TROPONIN I - Abnormal; Notable for the following:    Troponin I 0.04 (*)    All other components within normal limits  BRAIN NATRIURETIC PEPTIDE - Abnormal; Notable for the following:    B Natriuretic Peptide 634.3 (*)    All other components within normal limits  HEPATIC FUNCTION PANEL - Abnormal; Notable for the  following:    Bilirubin, Direct <0.1 (*)    All other components within normal limits  LIPASE, BLOOD    EKG  EKG Interpretation  Date/Time:  Saturday February 09 2017 11:43:33 EDT Ventricular Rate:  81 PR Interval:    QRS Duration: 106 QT Interval:  425 QTC Calculation: 494 R Axis:   63 Text Interpretation:  Atrial-sensed ventricular-paced rhythm No further analysis attempted due to paced rhythm No significant change since last tracing Confirmed by Dorie Rank [phone]) on 02/09/2017 11:51:57 AM       Radiology Dg Chest 2 View  Result Date: 02/09/2017 CLINICAL DATA:  Shortness of Breath EXAM: CHEST  2 VIEW COMPARISON:  Chest radiograph November 15, 2016 and chest CT November 15, 2016 FINDINGS: The areas of patchy airspace opacity noted previously have resolve. There is currently no airspace consolidation or appreciable edema. There is cardiomegaly with pulmonary venous hypertension. Pacemaker leads are attached to the right atrium, right ventricle, and coronary sinus. There are total shoulder replacements bilaterally. No bone lesions. No adenopathy. IMPRESSION: Pulmonary vascular congestion without frank edema or consolidation. Electronically Signed   By: Lowella Grip III M.D.   On: 02/09/2017 12:06    Procedures Procedures (including critical care time)  Medications Ordered in ED Medications - No data to display   Initial Impression / Assessment and Plan / ED Course  I have reviewed the triage vital signs and the  nursing notes.  Pertinent labs & imaging results that were available during my care of the patient were reviewed by me and considered in my medical decision making (see chart for details).  56 year old female here with worsening shortness of breath and abdominal pain. Has a known hiatal hernia is been bothering her. Shortness of breath has been worsening recently. Reports recent bout of bronchitis. Here she is afebrile and nontoxic. Her lungs are overall clear. She is in no acute distress. Vitals are stable. Lab work with elevated troponin at 0.04 and elevated BNP at 634.4. There is vascular congestion noted on her chest x-ray but no overt edema or infiltrate. Suspect troponin elevation secondary to demand ischemia. She denies any chest pain here. EKG is unchanged from prior. Patient given aspirin and additional Lasix. She will be admitted to hospitalist service.  Discussed with hospitalist-- Dr. Aggie Moats, will admit for tele/obs.    Final Clinical Impressions(s) / ED Diagnoses   Final diagnoses:  Congestive heart failure, unspecified HF chronicity, unspecified heart failure type ([place])  Elevated troponin    New Prescriptions New Prescriptions   No medications on file     Larene Pickett, PA-C 02/09/17 1413    Dorie Rank, MD 02/10/17 [phone]

## 2017-02-09 NOTE — ED Triage Notes (Signed)
Upper abd pain and bloating with SOB. Symptoms have been going on for awhile. Hx of hiatal hernia and CHF. Pt states she was recently hospitalized for same pt symptoms are worsening.

## 2017-02-09 NOTE — Progress Notes (Signed)
B/P 141/79 Hydralazine PRN not given orders parameters not met.

## 2017-02-09 NOTE — ED Notes (Signed)
Pt refused wheelchair. Ambulated unassisted a short distance to BR. Steady gait.

## 2017-02-09 NOTE — H&P (Signed)
[facility] History and Physical  Michelle Andrade SWF:[id number] DOB: 04/23/1961 DOA: 02/09/2017  Referring physician:  PCP: Henderson Baltimore, [name]   Chief Complaint:sob  HPI: Michelle Andrade is a 56 y.o. female  history significant for CHF, diabetes, gout, hypertension, heart attack, sleep apnea who presents with shortness breath. Patient states that she is slowly begun more short of breath over the last week. No medication changes. Has adhered to taking her medications. Is unsure what her dry weight is. Patient states she is very anxious and she thinks is making her shortness of breath worse. Also thinks that her daily bloatedness related to her shortness breath. Denies any chest pain, nausea, vomiting and diaphoresis.  ED Course: trop 0.04, elevated bnp, no CP, given lasix and asa, CXR show vascular congestion Patient has been diuresing but is still very dyspneic on exam per the EDP  Review of Systems:  As per HPI otherwise 10 point review of systems negative.    Past Medical History:  Diagnosis Date  . Arthritis   . CHF (congestive heart failure) ([place])   . Diabetes mellitus without complication ([place])   . Gout   . High cholesterol   . Hypertension   . Myocardial infarct ([place])   . Sleep apnea    Past Surgical History:  Procedure Laterality Date  . ABDOMINAL HYSTERECTOMY    . CARDIAC DEFIBRILLATOR PLACEMENT    . CARPAL TUNNEL RELEASE    . HEEL SPUR EXCISION    . KNEE SURGERY    . PACEMAKER GENERATOR CHANGE    . SHOULDER SURGERY     Social History:  reports that she has quit smoking. She has never used smokeless tobacco. She reports that she does not drink alcohol or use drugs.  Allergies  Allergen Reactions  . Codeine Itching  . Gabapentin Itching  . Haldol [Haloperidol Lactate] Other (See Comments)    Dizziness, hallucinations  . Lisinopril Itching and Swelling  . Losartan Itching  . Morphine And Related Nausea And Vomiting  . Penicillins Nausea And Vomiting      "Vomiting, upset stomach, Headache" Has patient had a PCN reaction causing immediate rash, facial/tongue/throat swelling, SOB or lightheadedness with hypotension: No Has patient had a PCN reaction causing severe rash involving mucus membranes or skin necrosis: No Has patient had a PCN reaction that required hospitalization: No Has patient had a PCN reaction occurring within the last 10 years: No If all of the above answers are "NO", then may proceed with Cephalosporin use.     No family history on file.   Prior to Admission medications   Medication Sig Start Date End Date Taking? Authorizing Provider  amiodarone (PACERONE) 200 MG tablet Take 200 mg by mouth daily.   Yes [provider]  aspirin 81 MG chewable tablet Chew 81 mg by mouth daily.   Yes [provider]  Calcium Carbonate-Vitamin D3 (CALCIUM 600/VITAMIN D) 600-400 MG-UNIT TABS Take 1 tablet by mouth 2 (two) times daily.   Yes [provider]  carvedilol (COREG) 25 MG tablet Take 25 mg by mouth 2 (two) times daily with a meal.   Yes [provider]  clindamycin (CLEOCIN) 150 MG capsule Take 150 mg by mouth daily as needed (dental appointments).   Yes [provider]  cyclobenzaprine (FLEXERIL) 5 MG tablet Take 5 mg by mouth 3 (three) times daily as needed for muscle spasms.   Yes [provider]  ferrous sulfate 325 (65 FE) MG tablet Take 325 mg by  mouth 3 (three) times daily.    Yes [provider]  hydrALAZINE (APRESOLINE) 25 MG tablet Take 12.5 mg by mouth daily.    Yes [provider]  loratadine (CLARITIN) 10 MG tablet Take 10 mg by mouth daily.   Yes [provider]  magnesium oxide (MAG-OX) 400 MG tablet Take 400 mg by mouth 2 (two) times daily.   Yes [provider]  meloxicam (MOBIC) 7.5 MG tablet Take 7.5 mg by mouth daily.   Yes [provider]  metFORMIN (GLUCOPHAGE) 500 MG tablet Take by mouth 2 (two) times daily with  a meal.   Yes [provider]  Multiple Vitamin (MULTIVITAMIN) tablet Take 1 tablet by mouth daily.   Yes [provider]  omeprazole (PRILOSEC) 20 MG capsule Take 20 mg by mouth 2 (two) times daily.    Yes [provider]  omeprazole (PRILOSEC) 20 MG capsule Take 20 mg by mouth 2 (two) times daily.   Yes [provider]  potassium chloride (K-DUR,KLOR-CON) 10 MEQ tablet Take 10 mEq by mouth every other day.    Yes [provider]  rOPINIRole (REQUIP) 0.5 MG tablet Take 0.5 mg by mouth at bedtime.    Yes [provider]  sertraline (ZOLOFT) 50 MG tablet Take 150 mg by mouth daily.    Yes [provider]  simvastatin (ZOCOR) 20 MG tablet Take 20 mg by mouth daily.   Yes [provider]  spironolactone (ALDACTONE) 25 MG tablet Take 25 mg by mouth daily.   Yes [provider]  torsemide (DEMADEX) 20 MG tablet Take 20 mg by mouth daily.   Yes [provider]   Physical Exam: Vitals:   02/09/17 1444 02/09/17 1445 02/09/17 1642 02/09/17 1755  BP: (!) 142/86  (!) 142/103 (!) 141/79  Pulse: 78 80  89  Resp: 12  20   Temp:   98.5 F (36.9 C)   TempSrc:   Oral   SpO2: 94% 96% 95%   Weight:      Height:        Wt Readings from Last 3 Encounters:  02/09/17 80.7 kg (178 lb)    General:  Appears calm and comfortable; A&Ox3 Eyes:  PERRL, EOMI, normal lids, iris ENT:  grossly normal hearing, lips & tongue Neck:  no LAD, masses or thyromegaly Cardiovascular:  RRR, no m/r/g. No LE edema.  Respiratory:  CTA bilaterally, no w/r/r. tachypnea Abdomen:  soft, ntnd Skin:  no rash or induration seen on limited exam Musculoskeletal:  grossly normal tone BUE/BLE Psychiatric:  anxious, speech fluent and appropriate Neurologic:  CN 2-12 grossly intact, moves all extremities in coordinated fashion.          Labs on Admission:  Basic Metabolic Panel:  Recent Labs Lab 02/09/17 1222 02/09/17 1652  NA 139  --    K 3.9  --   CL 106  --   CO2 25  --   GLUCOSE 136*  --   BUN 16  --   CREATININE 1.44* 1.38*  CALCIUM 9.0  --   MG  --  1.8   Liver Function Tests:  Recent Labs Lab 02/09/17 1222  AST 24  ALT 16  ALKPHOS 67  BILITOT 0.7  PROT 7.3  ALBUMIN 3.7    Recent Labs Lab 02/09/17 1222  LIPASE 18   No results for input(s): AMMONIA in the last 168 hours. CBC:  Recent Labs Lab 02/09/17 1222 02/09/17 1652  WBC 5.1 6.6  NEUTROABS 3.6  --   HGB 10.6* 10.8*  HCT 31.8* 33.6*  MCV 93.3 93.1  PLT 289 315   Cardiac Enzymes:  Recent Labs Lab 02/09/17 1222 02/09/17 1652  TROPONINI 0.04* 0.04*    BNP (last 3 results)  Recent Labs  11/15/16 1348 02/09/17 1223  BNP 451.0* 634.3*    ProBNP (last 3 results) No results for input(s): PROBNP in the last 8760 hours.   Serum creatinine: 1.38 mg/dL (H) 02/09/17 1652 Estimated creatinine clearance: 44.7 mL/min (A)  CBG:  Recent Labs Lab 02/09/17 1647  GLUCAP 111*    Radiological Exams on Admission: Dg Chest 2 View  Result Date: 02/09/2017 CLINICAL DATA:  Shortness of Breath EXAM: CHEST  2 VIEW COMPARISON:  Chest radiograph November 15, 2016 and chest CT November 15, 2016 FINDINGS: The areas of patchy airspace opacity noted previously have resolve. There is currently no airspace consolidation or appreciable edema. There is cardiomegaly with pulmonary venous hypertension. Pacemaker leads are attached to the right atrium, right ventricle, and coronary sinus. There are total shoulder replacements bilaterally. No bone lesions. No adenopathy. IMPRESSION: Pulmonary vascular congestion without frank edema or consolidation. Electronically Signed   By: Lowella Grip III M.D.   On: 02/09/2017 12:06    EKG: Independently reviewed. No stemi.  Assessment/Plan Principal Problem:   Pulmonary vascular congestion Active Problems:   CHF (congestive heart failure) (H[facility])   Hypertension   Sleep apnea   Acute heart failure Serial  troponin, 0.04 on admit Diuresis Fluid seen on cxr Echo tomorrow, yes Ins and outs Continuous pulse ox Cont amiodarone, coreg, aldactone CXR in AM  DM SSI ACHS Hold metformin  Pain Hold flexeril and mobic  Hypertension When necessary hydralazine 10 mg IV as needed for severe blood pressure Cont hydrlazine  OSA CPAP  GERD Cont PPI  RLS Cont requip  Mood disorder Con zoloft Pt anxious but no SI HI  Hyperlipidemia Continue statin   Code Status: FULL  DVT Prophylaxis: lovenox Family Communication: family on phone, pt updating Disposition Plan: Pending Improvement  Status: tele obs  Elwin Mocha, MD Family Medicine [facility] [email or web address] Password [email or web address]

## 2017-02-09 NOTE — Plan of Care (Signed)
Past medical history: Sleep apnea, history of myocardial infarction, hypertension, hyperlipidemia, gout, diabetes, CHF  CC: incr sob for 1 wk  ED Course: trop 0.04, elevated bnp, no CP, given lasix and asa, CXR show vascular congestion Patient has been diuresing but is still very dyspneic on exam per the EDP  Status: tele, obs  Elwin Mocha MD

## 2017-02-09 NOTE — Progress Notes (Signed)
CRITICAL VALUE ALERT  Critical Value:  Troponin 0.04  Date & Time Notied:  02/09/17/ 06:00  Provider Notified: Aggie Moats, MD  Orders Received/Actions taken: No new orders noted

## 2017-02-09 NOTE — Progress Notes (Signed)
Patient B/P 142/103. MD notified. Rechecked B/P 141/71. Patient aysmptomatic. Denies any pain. Dyspnea at rest. MD at bedside.

## 2017-02-09 NOTE — Progress Notes (Signed)
Patient with Diabetes Mellitus without complication, no sliding scales. MD notified. Patient need CPAP machine at night, RT notified.

## 2017-02-09 NOTE — Progress Notes (Signed)
Patient arrived in the unit accompanied by three [facility] personnel via stretcher. Orientation to the unit given. Patient verbalizes understanding. Patient SOB 2l  O2 in place.

## 2017-02-09 NOTE — Progress Notes (Signed)
Attempted CPAP but patient was unable to tolerate. Patient on 2L with 02 saturations at 97%. CPAP removed from room. RT will continue to monitor.

## 2017-02-10 ENCOUNTER — Observation Stay (HOSPITAL_COMMUNITY): Payer: Medicare Other

## 2017-02-10 DIAGNOSIS — E785 Hyperlipidemia, unspecified: Secondary | ICD-10-CM | POA: Diagnosis present

## 2017-02-10 DIAGNOSIS — I509 Heart failure, unspecified: Secondary | ICD-10-CM | POA: Diagnosis not present

## 2017-02-10 DIAGNOSIS — E1165 Type 2 diabetes mellitus with hyperglycemia: Secondary | ICD-10-CM | POA: Diagnosis present

## 2017-02-10 DIAGNOSIS — N183 Chronic kidney disease, stage 3 (moderate): Secondary | ICD-10-CM | POA: Diagnosis present

## 2017-02-10 DIAGNOSIS — Z79899 Other long term (current) drug therapy: Secondary | ICD-10-CM | POA: Diagnosis not present

## 2017-02-10 DIAGNOSIS — Z95810 Presence of automatic (implantable) cardiac defibrillator: Secondary | ICD-10-CM | POA: Diagnosis not present

## 2017-02-10 DIAGNOSIS — R748 Abnormal levels of other serum enzymes: Secondary | ICD-10-CM | POA: Diagnosis present

## 2017-02-10 DIAGNOSIS — E1122 Type 2 diabetes mellitus with diabetic chronic kidney disease: Secondary | ICD-10-CM | POA: Diagnosis present

## 2017-02-10 DIAGNOSIS — I130 Hypertensive heart and chronic kidney disease with heart failure and stage 1 through stage 4 chronic kidney disease, or unspecified chronic kidney disease: Secondary | ICD-10-CM | POA: Diagnosis present

## 2017-02-10 DIAGNOSIS — R0989 Other specified symptoms and signs involving the circulatory and respiratory systems: Secondary | ICD-10-CM

## 2017-02-10 DIAGNOSIS — G2581 Restless legs syndrome: Secondary | ICD-10-CM | POA: Diagnosis present

## 2017-02-10 DIAGNOSIS — I5023 Acute on chronic systolic (congestive) heart failure: Secondary | ICD-10-CM

## 2017-02-10 DIAGNOSIS — K5901 Slow transit constipation: Secondary | ICD-10-CM | POA: Diagnosis not present

## 2017-02-10 DIAGNOSIS — Z880 Allergy status to penicillin: Secondary | ICD-10-CM | POA: Diagnosis not present

## 2017-02-10 DIAGNOSIS — D509 Iron deficiency anemia, unspecified: Secondary | ICD-10-CM | POA: Diagnosis present

## 2017-02-10 DIAGNOSIS — Z87891 Personal history of nicotine dependence: Secondary | ICD-10-CM | POA: Diagnosis not present

## 2017-02-10 DIAGNOSIS — G4733 Obstructive sleep apnea (adult) (pediatric): Secondary | ICD-10-CM | POA: Diagnosis present

## 2017-02-10 DIAGNOSIS — E876 Hypokalemia: Secondary | ICD-10-CM | POA: Diagnosis present

## 2017-02-10 DIAGNOSIS — I5021 Acute systolic (congestive) heart failure: Secondary | ICD-10-CM | POA: Diagnosis not present

## 2017-02-10 DIAGNOSIS — I429 Cardiomyopathy, unspecified: Secondary | ICD-10-CM | POA: Diagnosis present

## 2017-02-10 DIAGNOSIS — N179 Acute kidney failure, unspecified: Secondary | ICD-10-CM | POA: Diagnosis present

## 2017-02-10 DIAGNOSIS — G894 Chronic pain syndrome: Secondary | ICD-10-CM | POA: Diagnosis present

## 2017-02-10 DIAGNOSIS — I252 Old myocardial infarction: Secondary | ICD-10-CM | POA: Diagnosis not present

## 2017-02-10 DIAGNOSIS — Z888 Allergy status to other drugs, medicaments and biological substances status: Secondary | ICD-10-CM | POA: Diagnosis not present

## 2017-02-10 DIAGNOSIS — M109 Gout, unspecified: Secondary | ICD-10-CM | POA: Diagnosis present

## 2017-02-10 DIAGNOSIS — K5900 Constipation, unspecified: Secondary | ICD-10-CM | POA: Diagnosis present

## 2017-02-10 DIAGNOSIS — I10 Essential (primary) hypertension: Secondary | ICD-10-CM | POA: Diagnosis not present

## 2017-02-10 DIAGNOSIS — F39 Unspecified mood [affective] disorder: Secondary | ICD-10-CM | POA: Diagnosis present

## 2017-02-10 DIAGNOSIS — K219 Gastro-esophageal reflux disease without esophagitis: Secondary | ICD-10-CM | POA: Diagnosis present

## 2017-02-10 LAB — ECHOCARDIOGRAM COMPLETE
CHL CUP MV DEC (S): 144
CHL CUP STROKE VOLUME: 30 mL
E decel time: 144 msec
EERAT: 19.75
FS: 14 % — AB (ref 28–44)
HEIGHTINCHES: 63 in
IV/PV OW: 0.83
LA vol A4C: 83.2 ml
LA vol: 76.9 mL
LADIAMINDEX: 2.75 cm/m2
LASIZE: 50 mm
LAVOLIN: 42.3 mL/m2
LDCA: 2.84 cm2
LEFT ATRIUM END SYS DIAM: 50 mm
LV E/e' medial: 19.75
LV E/e'average: 19.75
LV PW d: 12 mm — AB (ref 0.6–1.1)
LV dias vol: 124 mL — AB (ref 46–106)
LV e' LATERAL: 7.29 cm/s
LV sys vol index: 52 mL/m2
LV sys vol: 94 mL — AB (ref 14–42)
LVDIAVOLIN: 68 mL/m2
LVOT diameter: 19 mm
Lateral S' vel: 11.2 cm/s
MV VTI: 175 cm
MV pk A vel: 88.6 m/s
MVPG: 8 mmHg
MVPKEVEL: 144 m/s
PISA EROA: 0.11 cm2
RV sys press: 80 mmHg
Reg peak vel: 440 cm/s
Simpson's disk: 24
TAPSE: 18.9 mm
TDI e' lateral: 7.29
TDI e' medial: 4.46
TR max vel: 440 cm/s
Weight: 2772.8 oz

## 2017-02-10 LAB — CBC WITH DIFFERENTIAL/PLATELET
BASOS PCT: 1 %
Basophils Absolute: 0 10*3/uL (ref 0.0–0.1)
EOS ABS: 0.2 10*3/uL (ref 0.0–0.7)
EOS PCT: 3 %
HCT: 32.9 % — ABNORMAL LOW (ref 36.0–46.0)
HEMOGLOBIN: 10.5 g/dL — AB (ref 12.0–15.0)
Lymphocytes Relative: 31 %
Lymphs Abs: 2.1 10*3/uL (ref 0.7–4.0)
MCH: 29.7 pg (ref 26.0–34.0)
MCHC: 31.9 g/dL (ref 30.0–36.0)
MCV: 92.9 fL (ref 78.0–100.0)
Monocytes Absolute: 0.3 10*3/uL (ref 0.1–1.0)
Monocytes Relative: 5 %
Neutro Abs: 4.1 10*3/uL (ref 1.7–7.7)
Neutrophils Relative %: 60 %
PLATELETS: 287 10*3/uL (ref 150–400)
RBC: 3.54 MIL/uL — AB (ref 3.87–5.11)
RDW: 16 % — ABNORMAL HIGH (ref 11.5–15.5)
WBC: 6.8 10*3/uL (ref 4.0–10.5)

## 2017-02-10 LAB — BASIC METABOLIC PANEL
Anion gap: 10 (ref 5–15)
BUN: 16 mg/dL (ref 6–20)
CALCIUM: 9 mg/dL (ref 8.9–10.3)
CHLORIDE: 103 mmol/L (ref 101–111)
CO2: 25 mmol/L (ref 22–32)
CREATININE: 1.4 mg/dL — AB (ref 0.44–1.00)
GFR calc Af Amer: 47 mL/min — ABNORMAL LOW (ref >60)
GFR, EST NON AFRICAN AMERICAN: 41 mL/min — AB (ref >60)
Glucose, Bld: 127 mg/dL — ABNORMAL HIGH (ref 65–99)
Potassium: 3.4 mmol/L — ABNORMAL LOW (ref 3.5–5.1)
SODIUM: 138 mmol/L (ref 135–145)

## 2017-02-10 LAB — GLUCOSE, CAPILLARY
GLUCOSE-CAPILLARY: 148 mg/dL — AB (ref 65–99)
Glucose-Capillary: 151 mg/dL — ABNORMAL HIGH (ref 65–99)
Glucose-Capillary: 123 mg/dL — ABNORMAL HIGH (ref 65–99)
Glucose-Capillary: 96 mg/dL (ref 65–99)
Glucose-Capillary: 202 mg/dL — ABNORMAL HIGH (ref 65–99)

## 2017-02-10 LAB — HIV ANTIBODY (ROUTINE TESTING W REFLEX): HIV SCREEN 4TH GENERATION: NONREACTIVE

## 2017-02-10 LAB — TROPONIN I: TROPONIN I: 0.05 ng/mL — AB (ref <0.03)

## 2017-02-10 MED ORDER — ISOSORBIDE MONONITRATE ER 60 MG PO TB24
60.0000 mg | ORAL_TABLET | Freq: Every day | ORAL | Status: DC
  Administered 2017-02-10 – 2017-02-15 (×6): 60 mg via ORAL
  Filled 2017-02-10 (×6): qty 1

## 2017-02-10 MED ORDER — HYDRALAZINE HCL 25 MG PO TABS
25.0000 mg | ORAL_TABLET | Freq: Three times a day (TID) | ORAL | Status: DC
  Administered 2017-02-10 – 2017-02-15 (×12): 25 mg via ORAL
  Filled 2017-02-10 (×14): qty 1

## 2017-02-10 MED ORDER — POTASSIUM CHLORIDE CRYS ER 20 MEQ PO TBCR
40.0000 meq | EXTENDED_RELEASE_TABLET | Freq: Two times a day (BID) | ORAL | Status: DC
  Administered 2017-02-10 – 2017-02-11 (×4): 40 meq via ORAL
  Filled 2017-02-10 (×4): qty 2

## 2017-02-10 MED ORDER — LORAZEPAM 2 MG/ML IJ SOLN: 0.5000 mg | Freq: Three times a day (TID) | INTRAMUSCULAR | Status: DC | PRN

## 2017-02-10 NOTE — Care Management Obs Status (Signed)
[place] NOTIFICATION   Patient Details  Name: Michelle Andrade MRN: [id number] Date of Birth: May 14, 1961   Medicare Observation Status Notification Given:  Yes    Carles Collet, RN 02/10/2017, 3:26 PM

## 2017-02-10 NOTE — Consult Note (Signed)
Primary Physician: Primary Cardiologist:  New   Previous [facility]  Dr Otho Perl    Asked to see by Dr Allyson Sabal for CHF    HPI: 56 yo who was previously followed at [facility]   Hx of HTN, systolic CHF, CKD Stage III, HL , DM and HTN.  Also OSA  (on CPAP) , GERD, anemia,  She was recently admitted there  (12/26/16) with CHF exacerbation  Last echo 12/27/16 LVEF 30%  Md to severe MR moderate TR PAP 70   Pt upset with care there  Got mixed messages  Looking for care elsewhere   Pt says she os  Recovering from bronchitis  About 6 days ago had SOB and fullness in chest  Wheezy  Stomach distended  LE heavy  Sleeps in chair anyway NOw upright    Yesterday she went to get mail  Legs heavy  Felt bad   Came to ED Has been pushing abdomen down to ease pressure ,  Taking laxatives  She says that with CHF exacerbations she has bloating not lower extremity swelling    Pt had cath in 2014  NOnobstructive CAD  Also has BiV ICD Done in 2015  Pt says that since BiV symptoms improved some  Could walk more   legs    Hx DVT in R leg 1 1/2 year ago  On coumadin for 6 months      Past Medical History:  Diagnosis Date  . Arthritis   . CHF (congestive heart failure) ([place])   . Diabetes mellitus without complication ([place])   . Gout   . High cholesterol   . Hypertension   . Myocardial infarct ([place])   . Sleep apnea     Medications Prior to Admission  Medication Sig Dispense Refill  . amiodarone (PACERONE) 200 MG tablet Take 200 mg by mouth daily.    [name] aspirin 81 MG chewable tablet Chew 81 mg by mouth daily.    . Calcium Carbonate-Vitamin D3 (CALCIUM 600/VITAMIN D) 600-400 MG-UNIT TABS Take 1 tablet by mouth 2 (two) times daily.    . carvedilol (COREG) 25 MG tablet Take 25 mg by mouth 2 (two) times daily with a meal.    . clindamycin (CLEOCIN) 150 MG capsule Take 150 mg by mouth daily as needed (dental appointments).    . cyclobenzaprine (FLEXERIL) 5 MG tablet Take 5 mg by mouth 3 (three) times daily as  needed for muscle spasms.    . ferrous sulfate 325 (65 FE) MG tablet Take 325 mg by mouth 3 (three) times daily.     . hydrALAZINE (APRESOLINE) 25 MG tablet Take 12.5 mg by mouth daily.     [name] loratadine (CLARITIN) 10 MG tablet Take 10 mg by mouth daily.    . magnesium oxide (MAG-OX) 400 MG tablet Take 400 mg by mouth 2 (two) times daily.    . meloxicam (MOBIC) 7.5 MG tablet Take 7.5 mg by mouth daily.    . metFORMIN (GLUCOPHAGE) 500 MG tablet Take by mouth 2 (two) times daily with a meal.    . Multiple Vitamin (MULTIVITAMIN) tablet Take 1 tablet by mouth daily.    [name] omeprazole (PRILOSEC) 20 MG capsule Take 20 mg by mouth 2 (two) times daily.     [name] omeprazole (PRILOSEC) 20 MG capsule Take 20 mg by mouth 2 (two) times daily.    . potassium chloride (K-DUR,KLOR-CON) 10 MEQ tablet Take 10 mEq by mouth every other day.     [name]  rOPINIRole (REQUIP) 0.5 MG tablet Take 0.5 mg by mouth at bedtime.     . sertraline (ZOLOFT) 50 MG tablet Take 150 mg by mouth daily.     . simvastatin (ZOCOR) 20 MG tablet Take 20 mg by mouth daily.    [name] spironolactone (ALDACTONE) 25 MG tablet Take 25 mg by mouth daily.    [name] torsemide (DEMADEX) 20 MG tablet Take 20 mg by mouth daily.       [name] amiodarone  200 mg Oral Daily  . carvedilol  25 mg Oral BID WC  . enoxaparin (LOVENOX) injection  40 mg Subcutaneous Q24H  . hydrALAZINE  12.5 mg Oral Daily  . insulin aspart  0-15 Units Subcutaneous TID WC  . pantoprazole  40 mg Oral Daily  . potassium chloride  40 mEq Oral BID  . rOPINIRole  0.5 mg Oral QHS  . sertraline  150 mg Oral Daily  . simvastatin  20 mg Oral Daily  . sodium chloride flush  3 mL Intravenous Q12H  . spironolactone  25 mg Oral Daily    Infusions: . sodium chloride      Allergies  Allergen Reactions  . Codeine Itching  . Gabapentin Itching  . Haldol [Haloperidol Lactate] Other (See Comments)    Dizziness, hallucinations  . Lisinopril Itching and Swelling  . Losartan Itching  . Morphine And  Related Nausea And Vomiting  . Penicillins Nausea And Vomiting    "Vomiting, upset stomach, Headache" Has patient had a PCN reaction causing immediate rash, facial/tongue/throat swelling, SOB or lightheadedness with hypotension: No Has patient had a PCN reaction causing severe rash involving mucus membranes or skin necrosis: No Has patient had a PCN reaction that required hospitalization: No Has patient had a PCN reaction occurring within the last 10 years: No If all of the above answers are "NO", then may proceed with Cephalosporin use.     Social History   Social History  . Marital status: Single    Spouse name: N/A  . Number of children: N/A  . Years of education: N/A   Occupational History  . Not on file.   Social History Main Topics  . Smoking status: Former Research scientist (life sciences)  . Smokeless tobacco: Never Used  . Alcohol use No  . Drug use: No  . Sexual activity: Not on file   Other Topics Concern  . Not on file   Social History Narrative  . No narrative on file    No family history on file.  REVIEW OF SYSTEMS:  All systems reviewed  Negative to the above problem except as noted above.    PHYSICAL EXAM: Vitals:   02/09/17 1948 02/10/17 0528  BP: 128/73 (!) 153/90  Pulse: 79 78  Resp: 18 18  Temp: 98.4 F (36.9 C) 97.6 F (36.4 C)     Intake/Output Summary (Last 24 hours) at 02/10/17 1049 Last data filed at 02/10/17 1034  Gross per 24 hour  Intake             1083 ml  Output             2901 ml  Net            -1818 ml    General:  Talkative  Some coughting  SOB at times  On 2L [place]    HEENT: normal Neck: supple. JVP increased   Carotids 2+ bilat; no bruits. No lymphadenopathy or thryomegaly appreciated. Cor: PMI nondisplaced. Regular rate & rhythm. No rubs, gallops or  murmurs. Lungs: Upper airway wheeze otherwise rel clear   Abdomen: soft,  Slightly distended.  Tender RUQ  Normal BS  no masses Extremities: no cyanosis, clubbing, rash, edema Neuro: alert &  oriented x 3, cranial nerves grossly intact. moves all 4 extremities w/o difficulty. Affect pleasant.  ECG:  On 6/9 SR 80  BiV pacer    Results for orders placed or performed during the hospital encounter of 02/09/17 (from the past 24 hour(s))  CBC with Differential     Status: Abnormal   Collection Time: 02/09/17 12:22 PM  Result Value Ref Range   WBC 5.1 4.0 - 10.5 K/uL   RBC 3.41 (L) 3.87 - 5.11 MIL/uL   Hemoglobin 10.6 (L) 12.0 - 15.0 g/dL   HCT 31.8 (L) 36.0 - 46.0 %   MCV 93.3 78.0 - 100.0 fL   MCH 31.1 26.0 - 34.0 pg   MCHC 33.3 30.0 - 36.0 g/dL   RDW 15.7 (H) 11.5 - 15.5 %   Platelets 289 150 - 400 K/uL   Neutrophils Relative % 70 %   Neutro Abs 3.6 1.7 - 7.7 K/uL   Lymphocytes Relative 20 %   Lymphs Abs 1.0 0.7 - 4.0 K/uL   Monocytes Relative 7 %   Monocytes Absolute 0.4 0.1 - 1.0 K/uL   Eosinophils Relative 3 %   Eosinophils Absolute 0.1 0.0 - 0.7 K/uL   Basophils Relative 0 %   Basophils Absolute 0.0 0.0 - 0.1 K/uL  Basic metabolic panel     Status: Abnormal   Collection Time: 02/09/17 12:22 PM  Result Value Ref Range   Sodium 139 135 - 145 mmol/L   Potassium 3.9 3.5 - 5.1 mmol/L   Chloride 106 101 - 111 mmol/L   CO2 25 22 - 32 mmol/L   Glucose, Bld 136 (H) 65 - 99 mg/dL   BUN 16 6 - 20 mg/dL   Creatinine, Ser 1.44 (H) 0.44 - 1.00 mg/dL   Calcium 9.0 8.9 - 10.3 mg/dL   GFR calc non Af Amer 39 (L) >60 mL/min   GFR calc Af Amer 45 (L) >60 mL/min   Anion gap 8 5 - 15  Troponin I     Status: Abnormal   Collection Time: 02/09/17 12:22 PM  Result Value Ref Range   Troponin I 0.04 (HH) <0.03 ng/mL  Hepatic function panel     Status: Abnormal   Collection Time: 02/09/17 12:22 PM  Result Value Ref Range   Total Protein 7.3 6.5 - 8.1 g/dL   Albumin 3.7 3.5 - 5.0 g/dL   AST 24 15 - 41 U/L   ALT 16 14 - 54 U/L   Alkaline Phosphatase 67 38 - 126 U/L   Total Bilirubin 0.7 0.3 - 1.2 mg/dL   Bilirubin, Direct <0.1 (L) 0.1 - 0.5 mg/dL   Indirect Bilirubin NOT  CALCULATED 0.3 - 0.9 mg/dL  Lipase, blood     Status: None   Collection Time: 02/09/17 12:22 PM  Result Value Ref Range   Lipase 18 11 - 51 U/L  Brain natriuretic peptide     Status: Abnormal   Collection Time: 02/09/17 12:23 PM  Result Value Ref Range   B Natriuretic Peptide 634.3 (H) 0.0 - 100.0 pg/mL  Glucose, capillary     Status: Abnormal   Collection Time: 02/09/17  4:47 PM  Result Value Ref Range   Glucose-Capillary 111 (H) 65 - 99 mg/dL  Troponin I (q 6hr x 3)     Status:  Abnormal   Collection Time: 02/09/17  4:52 PM  Result Value Ref Range   Troponin I 0.04 (HH) <0.03 ng/mL  Magnesium     Status: None   Collection Time: 02/09/17  4:52 PM  Result Value Ref Range   Magnesium 1.8 1.7 - 2.4 mg/dL  HIV antibody (Routine Testing)     Status: None   Collection Time: 02/09/17  4:52 PM  Result Value Ref Range   HIV Screen 4th Generation wRfx Non Reactive Non Reactive  CBC     Status: Abnormal   Collection Time: 02/09/17  4:52 PM  Result Value Ref Range   WBC 6.6 4.0 - 10.5 K/uL   RBC 3.61 (L) 3.87 - 5.11 MIL/uL   Hemoglobin 10.8 (L) 12.0 - 15.0 g/dL   HCT 33.6 (L) 36.0 - 46.0 %   MCV 93.1 78.0 - 100.0 fL   MCH 29.9 26.0 - 34.0 pg   MCHC 32.1 30.0 - 36.0 g/dL   RDW 16.1 (H) 11.5 - 15.5 %   Platelets 315 150 - 400 K/uL  Creatinine, serum     Status: Abnormal   Collection Time: 02/09/17  4:52 PM  Result Value Ref Range   Creatinine, Ser 1.38 (H) 0.44 - 1.00 mg/dL   GFR calc non Af Amer 41 (L) >60 mL/min   GFR calc Af Amer 48 (L) >60 mL/min  Glucose, capillary     Status: Abnormal   Collection Time: 02/09/17  8:53 PM  Result Value Ref Range   Glucose-Capillary 132 (H) 65 - 99 mg/dL  Troponin I (q 6hr x 3)     Status: Abnormal   Collection Time: 02/09/17  9:29 PM  Result Value Ref Range   Troponin I 0.04 (HH) <0.03 ng/mL  Troponin I (q 6hr x 3)     Status: Abnormal   Collection Time: 02/10/17  5:22 AM  Result Value Ref Range   Troponin I 0.05 (HH) <0.03 ng/mL  Basic  metabolic panel     Status: Abnormal   Collection Time: 02/10/17  5:22 AM  Result Value Ref Range   Sodium 138 135 - 145 mmol/L   Potassium 3.4 (L) 3.5 - 5.1 mmol/L   Chloride 103 101 - 111 mmol/L   CO2 25 22 - 32 mmol/L   Glucose, Bld 127 (H) 65 - 99 mg/dL   BUN 16 6 - 20 mg/dL   Creatinine, Ser 1.40 (H) 0.44 - 1.00 mg/dL   Calcium 9.0 8.9 - 10.3 mg/dL   GFR calc non Af Amer 41 (L) >60 mL/min   GFR calc Af Amer 47 (L) >60 mL/min   Anion gap 10 5 - 15  CBC with Differential/Platelet     Status: Abnormal   Collection Time: 02/10/17  5:22 AM  Result Value Ref Range   WBC 6.8 4.0 - 10.5 K/uL   RBC 3.54 (L) 3.87 - 5.11 MIL/uL   Hemoglobin 10.5 (L) 12.0 - 15.0 g/dL   HCT 32.9 (L) 36.0 - 46.0 %   MCV 92.9 78.0 - 100.0 fL   MCH 29.7 26.0 - 34.0 pg   MCHC 31.9 30.0 - 36.0 g/dL   RDW 16.0 (H) 11.5 - 15.5 %   Platelets 287 150 - 400 K/uL   Neutrophils Relative % 60 %   Neutro Abs 4.1 1.7 - 7.7 K/uL   Lymphocytes Relative 31 %   Lymphs Abs 2.1 0.7 - 4.0 K/uL   Monocytes Relative 5 %   Monocytes Absolute 0.3 0.1 -  1.0 K/uL   Eosinophils Relative 3 %   Eosinophils Absolute 0.2 0.0 - 0.7 K/uL   Basophils Relative 1 %   Basophils Absolute 0.0 0.0 - 0.1 K/uL  Glucose, capillary     Status: Abnormal   Collection Time: 02/10/17  7:32 AM  Result Value Ref Range   Glucose-Capillary 151 (H) 65 - 99 mg/dL   Comment 1 Notify RN    Comment 2 Document in Chart   Glucose, capillary     Status: Abnormal   Collection Time: 02/10/17 10:43 AM  Result Value Ref Range   Glucose-Capillary 148 (H) 65 - 99 mg/dL   Dg Chest 2 View  Result Date: 02/10/2017 CLINICAL DATA:  Shortness of breath. EXAM: CHEST  2 VIEW COMPARISON:  02/09/2017 and prior exams FINDINGS: Cardiomegaly and left-sided pacemaker/ ICD again noted. Pulmonary vascular congestion and minimal interstitial edema again noted. There is no evidence of pleural effusion or pneumothorax. No acute bony abnormalities are present. There has been  little interval change since prior study. IMPRESSION: Unchanged appearance of the chest with cardiomegaly and minimal interstitial pulmonary edema. Electronically Signed   By: Margarette Canada M.D.   On: 02/10/2017 07:19   Dg Chest 2 View  Result Date: 02/09/2017 CLINICAL DATA:  Shortness of Breath EXAM: CHEST  2 VIEW COMPARISON:  Chest radiograph November 15, 2016 and chest CT November 15, 2016 FINDINGS: The areas of patchy airspace opacity noted previously have resolve. There is currently no airspace consolidation or appreciable edema. There is cardiomegaly with pulmonary venous hypertension. Pacemaker leads are attached to the right atrium, right ventricle, and coronary sinus. There are total shoulder replacements bilaterally. No bone lesions. No adenopathy. IMPRESSION: Pulmonary vascular congestion without frank edema or consolidation. Electronically Signed   By: Lowella Grip III M.D.   On: 02/09/2017 12:06   Dg Abd 1 View  Result Date: 02/09/2017 CLINICAL DATA:  Abdominal pain/distension, constipation EXAM: ABDOMEN - 1 VIEW COMPARISON:  None. FINDINGS: Nonobstructive bowel gas pattern. Mild degenerative changes of the lumbar spine. IMPRESSION: Unremarkable abdominal radiograph. Electronically Signed   By: Julian Hy M.D.   On: 02/09/2017 21:24   ECHO at [facility] 4/26 Summary Left ventricle cavity size is normal. Severe global LV hypokinesis. Ejection fraction is visually estimated at 30% Moderate to Severe mitral regurgitation. Normal right ventricle structure and function. Pacer and/or defibrillator wires visualized in right ventricle. Mild to moderate tricuspid regurgitation. RVSP 70 mm Hg. Signature ------------------------------------------------------------------------------  Electronically signed by Milana Huntsman, MD(Interpreting physician) on  12/27/2016 12:48 PM ------------------------------------------------------------------------------ Findings Mitral Valve Structurally  normal mitral valve. Moderate to Severe mitral regurgitation. Aortic Valve The aortic valve appears to be trileaflet. No aortic stenosis. No aortic regurgitation. Tricuspid Valve Tricuspid valve is structurally normal. Mild to moderate tricuspid regurgitation. RVSP 70 mm Hg. Pulmonic Valve The pulmonic valve was not well visualized No Doppler evidence of pulmonic stenosis or insufficiency. Left Atrium Moderate Left Atrial enlargement Left Ventricle Left ventricle cavity size is normal. Severe global LV hypokinesis. Ejection fraction is visually estimated at 30% Right Atrium Normal right atrium. Right Ventricle Normal right ventricle structure and function. Pacer and/or defibrillator wires visualized in right ventricle. Pericardial Effusion No evidence of pericardial effusion. Miscellaneous The aorta is within normal limits. The IVC is normal M-Mode/2D Measurements & Calculations  LV Diastolic Dimension:LV Systolic Dimension: LA Dimension: 4.8 cmAO  5.15 cm5.02 cmRoot Dimension: 1.9 cmLA  LV FS:2.52 % LV Volume Diastolic: 742 Area: 59.5 cm2  LV PW Diastolic: 6.38 cm ml  Septum Diastolic: 2.01 LV Volume Systolic: 007  cm ml LV EDV/LV EDV Index: 137 ml/74 m2LV ESV/LV ESVLA/Aorta: 2.53 Index: 113 ml/61 m2 EF Calculated: 17.52 % LA volume/Index: 92.2 ml  /86m2 LVOT: 1.8 cm Doppler Measurements & Calculations  MV Peak E-Wave: 163 cm/s  MV Peak A-Wave: 124 cm/s  MV E/A Ratio: 1.31  MV Peak Gradient: 10.63 mmHg  TR Velocity:219 cm/s  MV Deceleration Time: 144 msecTR Gradient:19.1844 mmHg  TDI E' Velocity: 6.4 cm/s  MR Velocity: 532 cm/s  MR VTI: 186 cm  E/E' prime 25  MV ROA  Volumetric: 0.13 cm2 [address] [place], [place] [address] [phone]  ASSESSMENT:  Pt is a 56 yo wih hsitory of [employer].  Previously followed in [facility]  Unhappy   Has had several exacerbations  Presented to [facility] with abdominal bloating and SOB She is currently diuresing and feels a little better  Still bloated On exam, some volume increase  Review of records shows pt has signif MR (mod to severe)  LV function reported 30% (normal sz and thickness)    Would recomm Repeating echo to be able to see images, have in database here if transfers care Would continue IV diuresis Follow renal function  Pt with multiple reported allergies (ACEI, ARB)  Need to review WOuld increase hydralazine and add imdur  Pt on amiodarone  Need to review  Was started by EP for ? 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

## 2017-02-10 NOTE — Progress Notes (Signed)
  Echocardiogram 2D Echocardiogram has been performed.  Michelle Andrade 02/10/2017, 4:02 PM

## 2017-02-10 NOTE — Progress Notes (Addendum)
[facility] PROGRESS NOTE  Michelle Andrade BJY:[id number] DOB: 1961/04/11 DOA: 02/09/2017   PCP: Henderson Baltimore, [name]     Assessment/Plan: Principal Problem:   Pulmonary vascular congestion Active Problems:   CHF (congestive heart failure) ([place])   Hypertension   Sleep apnea   56 y.o. female presents for hospital follow up. Was hospitalized at [facility] 12/26/16-12/27/16. for exacerbation of CHF. She was admitted to the hospital with shortness of breath increased fatigue and abdominal distention for month.  elevated proBNP with chest x-ray findings of cardiomegaly with mild central pulmonary vascular congestion. Patient was therefore admitted  for acute on chronic systolic congestive heart failure. Patient was fluid restricted her started on IV Lasix patient improved rapidly. Repeat echocardiogram today showed moderate to severe mitral regurgitation with an EF of 25-30% . Patient is followed by Dr. Otho Perl. Most recent 2-D echo 12/27/16 shows EF of 30%, severe global hypokinesis, moderate to severe mitral regurgitation, moderate tricuspid regurgitation.  RVSP  70   Assessment and plan Acute on chronic systolic heart failure,MR  recent 2-D echo with results as above, no indication for repeat echo Serial troponin, 0.04 >0.05 Continue Diuresis On IV Lasix, Coreg, hydralazine, Aldactone Body weight 178>173 lbs since admission  Patient allergic to ACE inhibitor/ARB We have increased dose of hydralazine/Imdur  CK D stage III-baseline creatinine 1.4-1.6 Creatinine at baseline continue diuresis  History of atrial tachycardia Biventricular ICD (implantable cardioverter-defibrillator) Without any history of atrial fibrillation Currently on amiodarone    Hypokalemia-replete  Dyslipidemia-continue statin  DM SSI ACHS, check hemoglobin A1c Hold metformin   Chronic pain syndrome Hold flexeril and mobic  Hypertension, continue medications as above for heart failure When  necessary hydralazine 10 mg IV as needed for severe blood pressure Cont hydrlazine  OSA CPAP  GERD Cont PPI  RLS Cont requip  Mood disorder Con zoloft Pt anxious , start low-dose Ativan as needed     Iron deficiency anemia Hemoglobin stable around 10.5   DVT prophylaxsis  Lovenox  Code Status:  Full code     Family Communication: Discussed in detail with the patient, all imaging results, lab results explained to the patient   Disposition Plan-cardiology consult, not stable for discharge      Consultants:Cardiology  procedures:  None   Antibiotics: Anti-infectives    None         HPI/Subjective: Extremely short of breath, unable to speak in full sentences   02/09/17 1644 02/09/17 1755 02/09/17 1948 02/10/17 0528  BP:  (!) 141/79 128/73 (!) 153/90  Pulse:  89 79 78  Resp:   18 18  Temp:   98.4 F (36.9 C) 97.6 F (36.4 C)  TempSrc:   Oral Oral  SpO2:   97% 100%  Weight: 79.5 kg (175 lb 4.8 oz)   78.6 kg (173 lb 4.8 oz)  Height:        Intake/Output Summary (Last 24 hours) at 02/10/17 0819 Last data filed at 02/10/17 0600  Gross per 24 hour  Intake              960 ml  Output             2901 ml  Net            -1941 ml    Exam:  Examination:  General exam: Appears calm and comfortable  Respiratory system:  . Respiratory effort labored l. Cardiovascular system: S1 & S2 heard, RRR. No JVD, murmurs, rubs, gallops  or clicks. No pedal edema. Gastrointestinal system: Abdomen is nondistended, soft and nontender. No organomegaly or masses felt. Normal bowel sounds heard. Central nervous system: Alert and oriented. No focal neurological deficits. Extremities: Symmetric 5 x 5 power. Skin: No rashes, lesions or ulcers Psychiatry: Judgement and insight appear normal. Mood & affect appropriate.     Data Reviewed: I have personally reviewed following labs and imaging studies  Micro Results No results found for this or any previous visit  (from the past 240 hour(s)).  Radiology Reports Dg Chest 2 View  Result Date: 02/10/2017 CLINICAL DATA:  Shortness of breath. EXAM: CHEST  2 VIEW COMPARISON:  02/09/2017 and prior exams FINDINGS: Cardiomegaly and left-sided pacemaker/ ICD again noted. Pulmonary vascular congestion and minimal interstitial edema again noted. There is no evidence of pleural effusion or pneumothorax. No acute bony abnormalities are present. There has been little interval change since prior study. IMPRESSION: Unchanged appearance of the chest with cardiomegaly and minimal interstitial pulmonary edema. Electronically Signed   By: Margarette Canada M.D.   On: 02/10/2017 07:19   Dg Chest 2 View  Result Date: 02/09/2017 CLINICAL DATA:  Shortness of Breath EXAM: CHEST  2 VIEW COMPARISON:  Chest radiograph November 15, 2016 and chest CT November 15, 2016 FINDINGS: The areas of patchy airspace opacity noted previously have resolve. There is currently no airspace consolidation or appreciable edema. There is cardiomegaly with pulmonary venous hypertension. Pacemaker leads are attached to the right atrium, right ventricle, and coronary sinus. There are total shoulder replacements bilaterally. No bone lesions. No adenopathy. IMPRESSION: Pulmonary vascular congestion without frank edema or consolidation. Electronically Signed   By: Lowella Grip III M.D.   On: 02/09/2017 12:06   Dg Abd 1 View  Result Date: 02/09/2017 CLINICAL DATA:  Abdominal pain/distension, constipation EXAM: ABDOMEN - 1 VIEW COMPARISON:  None. FINDINGS: Nonobstructive bowel gas pattern. Mild degenerative changes of the lumbar spine. IMPRESSION: Unremarkable abdominal radiograph. Electronically Signed   By: Julian Hy M.D.   On: 02/09/2017 21:24     CBC  Recent Labs Lab 02/09/17 1222 02/09/17 1652 02/10/17 0522  WBC 5.1 6.6 6.8  HGB 10.6* 10.8* 10.5*  HCT 31.8* 33.6* 32.9*  PLT 289 315 287  MCV 93.3 93.1 92.9  MCH 31.1 29.9 29.7  MCHC 33.3 32.1 31.9   RDW 15.7* 16.1* 16.0*  LYMPHSABS 1.0  --  2.1  MONOABS 0.4  --  0.3  EOSABS 0.1  --  0.2  BASOSABS 0.0  --  0.0    Chemistries   Recent Labs Lab 02/09/17 1222 02/09/17 1652 02/10/17 0522  NA 139  --  138  K 3.9  --  3.4*  CL 106  --  103  CO2 25  --  25  GLUCOSE 136*  --  127*  BUN 16  --  16  CREATININE 1.44* 1.38* 1.40*  CALCIUM 9.0  --  9.0  MG  --  1.8  --   AST 24  --   --   ALT 16  --   --   ALKPHOS 67  --   --   BILITOT 0.7  --   --    ------------------------------------------------------------------------------------------------------------------ estimated creatinine clearance is 43.5 mL/min (A) (by C-G formula based on SCr of 1.4 mg/dL (H)). ------------------------------------------------------------------------------------------------------------------ No results for input(s): HGBA1C in the last 72 hours. ------------------------------------------------------------------------------------------------------------------ No results for input(s): CHOL, HDL, LDLCALC, TRIG, CHOLHDL, LDLDIRECT in the last 72 hours. ------------------------------------------------------------------------------------------------------------------ No results for input(s): TSH, T4TOTAL, T3FREE, THYROIDAB  in the last 72 hours.  Invalid input(s): FREET3 ------------------------------------------------------------------------------------------------------------------ No results for input(s): VITAMINB12, FOLATE, FERRITIN, TIBC, IRON, RETICCTPCT in the last 72 hours.  Coagulation profile No results for input(s): INR, PROTIME in the last 168 hours.  No results for input(s): DDIMER in the last 72 hours.  Cardiac Enzymes  Recent Labs Lab 02/09/17 1652 02/09/17 2129 02/10/17 0522  TROPONINI 0.04* 0.04* 0.05*   ------------------------------------------------------------------------------------------------------------------ Invalid input(s): POCBNP   CBG:  Recent Labs Lab  02/09/17 1647 02/09/17 2053 02/10/17 0732  GLUCAP 111* 132* 151*       Studies: Dg Chest 2 View  Result Date: 02/10/2017 CLINICAL DATA:  Shortness of breath. EXAM: CHEST  2 VIEW COMPARISON:  02/09/2017 and prior exams FINDINGS: Cardiomegaly and left-sided pacemaker/ ICD again noted. Pulmonary vascular congestion and minimal interstitial edema again noted. There is no evidence of pleural effusion or pneumothorax. No acute bony abnormalities are present. There has been little interval change since prior study. IMPRESSION: Unchanged appearance of the chest with cardiomegaly and minimal interstitial pulmonary edema. Electronically Signed   By: Margarette Canada M.D.   On: 02/10/2017 07:19   Dg Chest 2 View  Result Date: 02/09/2017 CLINICAL DATA:  Shortness of Breath EXAM: CHEST  2 VIEW COMPARISON:  Chest radiograph November 15, 2016 and chest CT November 15, 2016 FINDINGS: The areas of patchy airspace opacity noted previously have resolve. There is currently no airspace consolidation or appreciable edema. There is cardiomegaly with pulmonary venous hypertension. Pacemaker leads are attached to the right atrium, right ventricle, and coronary sinus. There are total shoulder replacements bilaterally. No bone lesions. No adenopathy. IMPRESSION: Pulmonary vascular congestion without frank edema or consolidation. Electronically Signed   By: Lowella Grip III M.D.   On: 02/09/2017 12:06   Dg Abd 1 View  Result Date: 02/09/2017 CLINICAL DATA:  Abdominal pain/distension, constipation EXAM: ABDOMEN - 1 VIEW COMPARISON:  None. FINDINGS: Nonobstructive bowel gas pattern. Mild degenerative changes of the lumbar spine. IMPRESSION: Unremarkable abdominal radiograph. Electronically Signed   By: Julian Hy M.D.   On: 02/09/2017 21:24      No results found for: HGBA1C Lab Results  Component Value Date   CREATININE 1.40 (H) 02/10/2017       Scheduled Meds: . amiodarone  200 mg Oral Daily  . carvedilol  25  mg Oral BID WC  . enoxaparin (LOVENOX) injection  40 mg Subcutaneous Q24H  . furosemide  60 mg Intravenous BID  . hydrALAZINE  12.5 mg Oral Daily  . insulin aspart  0-15 Units Subcutaneous TID WC  . pantoprazole  40 mg Oral Daily  . potassium chloride  10 mEq Oral QODAY  . rOPINIRole  0.5 mg Oral QHS  . sertraline  150 mg Oral Daily  . simvastatin  20 mg Oral Daily  . sodium chloride flush  3 mL Intravenous Q12H  . spironolactone  25 mg Oral Daily   Continuous Infusions: . sodium chloride       LOS: 0 days    Time spent: >30 MINS    Reyne Dumas  [facility] Pager [phone]. If 7PM-7AM, please contact night-coverage at [email or web address], password [facility] 02/10/2017, 8:19 AM  LOS: 0 days

## 2017-02-11 DIAGNOSIS — G4733 Obstructive sleep apnea (adult) (pediatric): Secondary | ICD-10-CM

## 2017-02-11 DIAGNOSIS — K5901 Slow transit constipation: Secondary | ICD-10-CM

## 2017-02-11 DIAGNOSIS — K5900 Constipation, unspecified: Secondary | ICD-10-CM

## 2017-02-11 DIAGNOSIS — I5023 Acute on chronic systolic (congestive) heart failure: Secondary | ICD-10-CM

## 2017-02-11 LAB — COMPREHENSIVE METABOLIC PANEL
ALT: 16 U/L (ref 14–54)
ANION GAP: 10 (ref 5–15)
AST: 21 U/L (ref 15–41)
Albumin: 3.4 g/dL — ABNORMAL LOW (ref 3.5–5.0)
Alkaline Phosphatase: 68 U/L (ref 38–126)
BUN: 35 mg/dL — ABNORMAL HIGH (ref 6–20)
CHLORIDE: 103 mmol/L (ref 101–111)
CO2: 24 mmol/L (ref 22–32)
Calcium: 9 mg/dL (ref 8.9–10.3)
Creatinine, Ser: 1.99 mg/dL — ABNORMAL HIGH (ref 0.44–1.00)
GFR calc non Af Amer: 26 mL/min — ABNORMAL LOW (ref >60)
GFR, EST AFRICAN AMERICAN: 31 mL/min — AB (ref >60)
Glucose, Bld: 159 mg/dL — ABNORMAL HIGH (ref 65–99)
Potassium: 4.1 mmol/L (ref 3.5–5.1)
SODIUM: 137 mmol/L (ref 135–145)
Total Bilirubin: 0.5 mg/dL (ref 0.3–1.2)
Total Protein: 6.6 g/dL (ref 6.5–8.1)

## 2017-02-11 LAB — GLUCOSE, CAPILLARY
GLUCOSE-CAPILLARY: 126 mg/dL — AB (ref 65–99)
Glucose-Capillary: 137 mg/dL — ABNORMAL HIGH (ref 65–99)
Glucose-Capillary: 124 mg/dL — ABNORMAL HIGH (ref 65–99)
Glucose-Capillary: 131 mg/dL — ABNORMAL HIGH (ref 65–99)

## 2017-02-11 LAB — HEMOGLOBIN A1C
Hgb A1c MFr Bld: 6.2 % — ABNORMAL HIGH (ref 4.8–5.6)
MEAN PLASMA GLUCOSE: 131 mg/dL

## 2017-02-11 MED ORDER — FUROSEMIDE 10 MG/ML IJ SOLN
80.0000 mg | Freq: Two times a day (BID) | INTRAMUSCULAR | Status: DC
  Administered 2017-02-11 – 2017-02-13 (×4): 80 mg via INTRAVENOUS
  Filled 2017-02-11 (×5): qty 8

## 2017-02-11 NOTE — Care Management Note (Signed)
Case Management Note  Patient Details  Name: Michelle Andrade MRN: [id number] Date of Birth: 10-Dec-1960  Subjective/Objective:        Pulmonary Vascular Congestion           Action/Plan: Patient goes to the [facility] in [place]; she was in the [employer] for 18 yrs and is seeing a Psychiatrist;Pharmacy of choice is the [facility]; She lives at home with her dog; she eats out daily but is now willing to make lifestyle changes; Patient is independent of all of her ADL's; continue to drive a car and take short walks with her dog; lots of emotional support given.  Expected Discharge Date:    possibly 02/14/2017              Expected Discharge Plan:  Home/Self Care  Choice offered to:  Patient  Status of Service:  In process, will continue to follow  Sherrilyn Rist [phone] 02/11/2017, 10:53 AM

## 2017-02-11 NOTE — Consult Note (Addendum)
Advanced Heart Failure Team Consult Note   Primary Physician:  Primary Cardiologist:  Dr. Otho Perl [facility]   Reason for Consultation: Acute on chronic systolic CHF  HPI:    Michelle Andrade is seen today for evaluation of acute on chronic systolic CHF at the request of Dr. Harrington Challenger.   Michelle Andrade is a 56 year old female with a past medical history of HTN, chronic systolic CHF s/p BI V ICD ([employer]),atrial tachycardia (2017), NICM (normal cors in 2014), CKD stage III, HLD, DM, OSA w/CPAP, and anemia.  She was admitted 12/26/16-12/27/16 with volume overload at [facility]. Echo that admission with EF 25-30%. She was discharged on Spiro 25mg  daily, Coreg 25mg  BID,and hydralazine 12.5mg  daily. Discharge weight was 177 pounds.   Her baseline creatinine appears to be 1.4-1.6 when reviewing her records from [facility] regional. Her last office visit with her Cardiologist Dr. Otho Perl was in January. He noted angioedema with ACE-I and cough with ARB.   She presented to the ED on 02/09/17 with SOB and abdominal bloating. Pertinent admission labs - creatinine 1.44, BNP 634, chest x ray with cardiomegaly, no frank edema. She was started on IV lasix, weight down ~ 2 pounds.   Michelle Andrade tells me that she has been feeling bad for about a week. On the Friday night prior to admission, she ate an entire bag of popcorn, drinks more than 2L a day. When we talked about her diet, she does not cook and eats out for every meal. She does report adherence to her medication regimen. She just graduated from [employer] with a degree in [employer]. She does not weight herself daily at home, but is aware when she is volume overloaded when she gets bloating in her abdomen. Gets SOB with walking throughout her house, SOB with stairs.     Review of Systems: [y] = yes, [ ]  = no   General: Weight gain [ y]; Weight loss [ ] ; Anorexia [ ] ; Fatigue [ ] ; Fever [ ] ; Chills [ ] ; Weakness [ ]   Cardiac: Chest pain/pressure [ ] ;  Resting SOB Blue.Reese ]; Exertional SOB Blue.Reese ]; Orthopnea Blue.Reese ]; Pedal Edema [ ] ; Palpitations [ ] ; Syncope [ ] ; Presyncope [ ] ; Paroxysmal nocturnal dyspnea[ ]   Pulmonary: Cough [ ] ; Wheezing[ y]; Hemoptysis[ ] ; Sputum [ ] ; Snoring [ ]   GI: Vomiting[ ] ; Dysphagia[ ] ; Melena[ ] ; Hematochezia [ ] ; Heartburn[ ] ; Abdominal pain [ ] ; Constipation [ ] ; Diarrhea [ ] ; BRBPR [ ]   GU: Hematuria[ ] ; Dysuria [ ] ; Nocturia[ ]   Vascular: Pain in legs with walking [ ] ; Pain in feet with lying flat [ ] ; Non-healing sores [ ] ; Stroke [ ] ; TIA [ ] ; Slurred speech [ ] ;  Neuro: Headaches[ ] ; Vertigo[ ] ; Seizures[ ] ; Paresthesias[ ] ;Blurred vision [ ] ; Diplopia [ ] ; Vision changes [ ]   Ortho/Skin: Arthritis Blue.Reese ]; Joint pain Blue.Reese ]; Muscle pain [ ] ; Joint swelling [ ] ; Back Pain [ ] ; Rash [ ]   Psych: Depression[ ] ; Anxiety[ ]   Heme: Bleeding problems [ ] ; Clotting disorders [ ] ; Anemia [ ]   Endocrine: Diabetes [y]; Thyroid dysfunction[ ]   Home Medications Prior to Admission medications   Medication Sig Start Date End Date Taking? Authorizing Provider  amiodarone (PACERONE) 200 MG tablet Take 200 mg by mouth daily.   Yes [provider]  aspirin 81 MG chewable tablet Chew 81 mg by mouth daily.   Yes [provider]  Calcium Carbonate-Vitamin D3 (  CALCIUM 600/VITAMIN D) 600-400 MG-UNIT TABS Take 1 tablet by mouth 2 (two) times daily.   Yes [provider]  carvedilol (COREG) 25 MG tablet Take 25 mg by mouth 2 (two) times daily with a meal.   Yes [provider]  clindamycin (CLEOCIN) 150 MG capsule Take 150 mg by mouth daily as needed (dental appointments).   Yes [provider]  cyclobenzaprine (FLEXERIL) 5 MG tablet Take 5 mg by mouth 3 (three) times daily as needed for muscle spasms.   Yes [provider]  ferrous sulfate 325 (65 FE) MG tablet Take 325 mg by mouth 3 (three) times daily.    Yes [provider]  hydrALAZINE (APRESOLINE) 25 MG tablet Take 12.5 mg by  mouth daily.    Yes [provider]  loratadine (CLARITIN) 10 MG tablet Take 10 mg by mouth daily.   Yes [provider]  magnesium oxide (MAG-OX) 400 MG tablet Take 400 mg by mouth 2 (two) times daily.   Yes [provider]  meloxicam (MOBIC) 7.5 MG tablet Take 7.5 mg by mouth daily.   Yes [provider]  metFORMIN (GLUCOPHAGE) 500 MG tablet Take by mouth 2 (two) times daily with a meal.   Yes [provider]  Multiple Vitamin (MULTIVITAMIN) tablet Take 1 tablet by mouth daily.   Yes [provider]  omeprazole (PRILOSEC) 20 MG capsule Take 20 mg by mouth 2 (two) times daily.    Yes [provider]  omeprazole (PRILOSEC) 20 MG capsule Take 20 mg by mouth 2 (two) times daily.   Yes [provider]  potassium chloride (K-DUR,KLOR-CON) 10 MEQ tablet Take 10 mEq by mouth every other day.    Yes [provider]  rOPINIRole (REQUIP) 0.5 MG tablet Take 0.5 mg by mouth at bedtime.    Yes [provider]  sertraline (ZOLOFT) 50 MG tablet Take 150 mg by mouth daily.    Yes [provider]  simvastatin (ZOCOR) 20 MG tablet Take 20 mg by mouth daily.   Yes [provider]  spironolactone (ALDACTONE) 25 MG tablet Take 25 mg by mouth daily.   Yes [provider]  torsemide (DEMADEX) 20 MG tablet Take 20 mg by mouth daily.   Yes [provider]    Past Medical History: Past Medical History:  Diagnosis Date  . Arthritis   . CHF (congestive heart failure) ([place])   . Diabetes mellitus without complication ([place])   . Gout   . High cholesterol   . Hypertension   . Myocardial infarct ([place])   . Sleep apnea     Past Surgical History: Past Surgical History:  Procedure Laterality Date  . ABDOMINAL HYSTERECTOMY    . CARDIAC DEFIBRILLATOR PLACEMENT    . CARPAL TUNNEL RELEASE    . HEEL SPUR EXCISION    . KNEE SURGERY    . PACEMAKER GENERATOR CHANGE    . SHOULDER SURGERY       Family History:  Denies FHx of familial cardiomyopathy or sudden cardiac death.   Social History: Social History   Social History  . Marital status: Single    Spouse name: N/A  . Number of children: N/A  . Years of education: N/A   Social History Main Topics  . Smoking status: Former Research scientist (life sciences)  . Smokeless tobacco: Never Used  . Alcohol use No  . Drug use: No  . Sexual activity: Not Asked   Other Topics Concern  . None  Social History Narrative  . None    Allergies:  Allergies  Allergen Reactions  . Codeine Itching  . Gabapentin Itching  . Haldol [Haloperidol Lactate] Other (See Comments)    Dizziness, hallucinations  . Lisinopril Itching and Swelling  . Losartan Itching  . Morphine And Related Nausea And Vomiting  . Penicillins Nausea And Vomiting    "Vomiting, upset stomach, Headache" Has patient had a PCN reaction causing immediate rash, facial/tongue/throat swelling, SOB or lightheadedness with hypotension: No Has patient had a PCN reaction causing severe rash involving mucus membranes or skin necrosis: No Has patient had a PCN reaction that required hospitalization: No Has patient had a PCN reaction occurring within the last 10 years: No If all of the above answers are "NO", then may proceed with Cephalosporin use.     Objective:    Vital Signs:   Temp:  [97.8 F (36.6 C)-98.2 F (36.8 C)] 97.8 F (36.6 C) (06/11 0514) Pulse Rate:  [66-75] 72 (06/11 0514) Resp:  [18-20] 18 (06/11 0514) BP: (112-125)/(61-86) 122/86 (06/11 0514) SpO2:  [100 %] 100 % (06/11 0514) Weight:  [176 lb 6.4 oz (80 kg)] 176 lb 6.4 oz (80 kg) (06/11 0514) Last BM Date: 02/09/17  Weight change: Filed Weights   02/09/17 1644 02/10/17 0528 02/11/17 0514  Weight: 175 lb 4.8 oz (79.5 kg) 173 lb 4.8 oz (78.6 kg) 176 lb 6.4 oz (80 kg)    Intake/Output:   Intake/Output Summary (Last 24 hours) at 02/11/17 0858 Last data filed at 02/10/17 2201  Gross per 24 hour  Intake               843 ml  Output              750 ml  Net               93 ml     Physical Exam: General:  Well appearing. No resp difficulty HEENT: normal Neck: supple. JVP 8-9 cm . Carotids 2+ bilat; no bruits. No lymphadenopathy or thyromegaly appreciated. Cor: PMI nondisplaced. Regular rate & rhythm. No rubs, gallops or murmurs. Lungs: Clear in upper lobes bilaterally. Scattered wheezes. Fine crackles in bilateral bases.  Abdomen: soft, nontender, + distended. No hepatosplenomegaly. No bruits or masses. Good bowel sounds. Extremities: no cyanosis, clubbing, rash. No peripheral edema, warm.  Neuro: alert & orientedx3, cranial nerves grossly intact. moves all 4 extremities w/o difficulty. Affect pleasant  Telemetry: V paced.   Labs: Basic Metabolic Panel:  Recent Labs Lab 02/09/17 1222 02/09/17 1652 02/10/17 0522 02/11/17 0236  NA 139  --  138 137  K 3.9  --  3.4* 4.1  CL 106  --  103 103  CO2 25  --  25 24  GLUCOSE 136*  --  127* 159*  BUN 16  --  16 35*  CREATININE 1.44* 1.38* 1.40* 1.99*  CALCIUM 9.0  --  9.0 9.0  MG  --  1.8  --   --     Liver Function Tests:  Recent Labs Lab 02/09/17 1222 02/11/17 0236  AST 24 21  ALT 16 16  ALKPHOS 67 68  BILITOT 0.7 0.5  PROT 7.3 6.6  ALBUMIN 3.7 3.4*    Recent Labs Lab 02/09/17 1222  LIPASE 18   No results for input(s): AMMONIA in the last 168 hours.  CBC:  Recent Labs Lab 02/09/17 1222 02/09/17 1652 02/10/17 0522  WBC 5.1 6.6 6.8  NEUTROABS 3.6  --  4.1  HGB 10.6* 10.8* 10.5*  HCT 31.8* 33.6* 32.9*  MCV 93.3 93.1 92.9  PLT 289 315 287    Cardiac Enzymes:  Recent Labs Lab 02/09/17 1222 02/09/17 1652 02/09/17 2129 02/10/17 0522  TROPONINI 0.04* 0.04* 0.04* 0.05*    BNP: BNP (last 3 results)  Recent Labs  11/15/16 1348 02/09/17 1223  BNP 451.0* 634.3*    ProBNP (last 3 results) No results for input(s): PROBNP in the last 8760 hours.   CBG:  Recent Labs Lab 02/10/17 0732 02/10/17 1043  02/10/17 1245 02/10/17 1602 02/10/17 2120  GLUCAP 151* 148* 123* 96 202*    Coagulation Studies: No results for input(s): LABPROT, INR in the last 72 hours.  Other results: EKG: V paced.   Transthoracic Echocardiography 02/10/17 Study Conclusions  - Left ventricle: The cavity size was moderately dilated. Wall   thickness was normal. The estimated ejection fraction was 20%.   Diffuse hypokinesis. Doppler parameters are consistent with both   elevated ventricular end-diastolic filling pressure and elevated   left atrial filling pressure. - Aortic valve: There was trivial regurgitation. - Mitral valve: There was moderate to severe regurgitation. - Left atrium: The atrium was moderately dilated. - Atrial septum: A patent foramen ovale cannot be excluded. - Tricuspid valve: There was moderate regurgitation. - Pulmonary arteries: PA peak pressure: 80 mm Hg (S). - Pericardium, extracardiac: Small pericardial effusion no   tamponade.    Medications:     Current Medications: . amiodarone  200 mg Oral Daily  . carvedilol  25 mg Oral BID WC  . enoxaparin (LOVENOX) injection  40 mg Subcutaneous Q24H  . hydrALAZINE  25 mg Oral Q8H  . insulin aspart  0-15 Units Subcutaneous TID WC  . isosorbide mononitrate  60 mg Oral Daily  . pantoprazole  40 mg Oral Daily  . potassium chloride  40 mEq Oral BID  . rOPINIRole  0.5 mg Oral QHS  . sertraline  150 mg Oral Daily  . simvastatin  20 mg Oral Daily  . sodium chloride flush  3 mL Intravenous Q12H  . spironolactone  25 mg Oral Daily     Infusions: . sodium chloride        Assessment/Plan   1. Acute on chronic systolic CHF: NICM, normal cors in 2014, likely related to HTN vs. Noncompliance. EF 20%, PA pressure 80.  - NYHA III - Remains volume overloaded on exam, orthopneic.  Give 80mg  IV lasix BID today.  - Will likely need more than 20mg  torsemide at discharge given noncompliance and frequent readmissions. - No Entresto with  history of angioedema. - Continue Spiro 25mg  daily.  - Continue isosorbide 60mg  daily.  - Continue hydralazine 25mg  TID.  - Continue Coreg 25mg  BID.  - I talked to her about paramedicine. Will send referral.  - We talked about dietary restrictions. She needs more education. Will refer to Heart Failure Navigator.  - consider RHC with elevated PA pressures.  2. CKD stage III: Baseline creatinine 1.3-1.6.  - Follow with BMET.  3. Bi V ICD  4. HTN - Well controlled on current regimen. 5. History of atrial tachycardia - On Amiodarone 200 mg daily   Length of Stay: [place], NP  02/11/2017, 8:58 AM  Advanced Heart Failure Team  Pager [phone] (M-F; 7a - 4p)  Please contact [facility] for night-coverage after hours (4p -7a ) and weekends on [email or web address]  Patient seen and examined with Jettie Booze, NP. We discussed all aspects of the  encounter. I agree with the assessment and plan as stated above.   56 y/o woman as above with OSA, HTN and systolic HF due to NICM. EF 20%. Admitted with recurrent volume overload and NYHA III-IIIB baseline symptoms. Agree with IV diuresis. Adjust meds as above. May need RHC prior to d/c to assess pulmonary pressures and outputs.   Needs extensive HF education and Paramedicine.  Glori Bickers, MD  6:24 PM

## 2017-02-11 NOTE — Progress Notes (Signed)
[facility] PROGRESS NOTE  Michelle Andrade VQQ:[id number] DOB: [birth year] DOA: 02/09/2017   PCP: Henderson Baltimore, [name]     Assessment/Plan: Principal Problem:   Pulmonary vascular congestion Active Problems:   CHF (congestive heart failure) ([place])   Hypertension   Sleep apnea   56 y.o. female presents for hospital follow up. Was hospitalized at [facility] 12/26/16-12/27/16. for exacerbation of CHF. She was admitted to the hospital with shortness of breath increased fatigue and abdominal distention for month.  elevated proBNP with chest x-ray findings of cardiomegaly with mild central pulmonary vascular congestion. Patient was therefore admitted  for acute on chronic systolic congestive heart failure. Patient was fluid restricted her started on IV Lasix patient improved rapidly. Repeat echocardiogram today showed moderate to severe mitral regurgitation with an EF of 25-30% . Patient is followed by Dr. Otho Perl. Most recent 2-D echo 12/27/16 shows EF of 30%, severe global hypokinesis, moderate to severe mitral regurgitation, moderate tricuspid regurgitation.  RVSP  70   Assessment and plan Acute on chronic systolic heart failure,MR Repeat 2-D echo shows EF of 20%, moderate to severe mitral regurgitation pulmonary hypertension Serial troponin, 0.04 >0.05 Continue Diuresis per cardiology, Lasix held today because of increasing creatinine Continue Coreg, hydralazine, Aldactone Body weight 178>173 lbs since admission  Patient allergic to ACE inhibitor/ARB HF team has been consulted    CK D stage III-baseline creatinine 1.4-1.6, creatinine has increased to 1.99    History of atrial tachycardia Biventricular ICD (implantable cardioverter-defibrillator) Without any history of atrial fibrillation Currently on amiodarone    Hypokalemia-repleted  Dyslipidemia-continue statin  DM SSI ACHS,   hemoglobin A1c pending Hold metformin   Chronic pain syndrome Hold flexeril and  mobic  Hypertension, continue medications as above for heart failure When necessary hydralazine 10 mg IV as needed for severe blood pressure Cont hydrlazine  OSA CPAP  GERD Cont PPI  RLS Cont requip  Mood disorder Con zoloft Continue low-dose Ativan for anxiety    Iron deficiency anemia Hemoglobin stable around 10.5   DVT prophylaxsis  Lovenox  Code Status:  Full code     Family Communication: Discussed in detail with the patient, all imaging results, lab results explained to the patient   Disposition Plan- plan per cardiology      Consultants:Cardiology  procedures:  None   Antibiotics: Anti-infectives    None         HPI/Subjective: Sob much improved , no cp ,     02/09/17 1644 02/09/17 1755 02/09/17 1948 02/10/17 0528  BP:  (!) 141/79 128/73 (!) 153/90  Pulse:  89 79 78  Resp:   18 18  Temp:   98.4 F (36.9 C) 97.6 F (36.4 C)  TempSrc:   Oral Oral  SpO2:   97% 100%  Weight: 79.5 kg (175 lb 4.8 oz)   78.6 kg (173 lb 4.8 oz)  Height:        Intake/Output Summary (Last 24 hours) at 02/10/17 0819 Last data filed at 02/10/17 0600  Gross per 24 hour  Intake              960 ml  Output             2901 ml  Net            -1941 ml    Exam:  Examination:  General exam: Appears calm and comfortable  Respiratory system:  . Respiratory effort improved, no wheezing  Cardiovascular system: S1 &  S2 heard, RRR. No JVD, murmurs, rubs, gallops or clicks. No pedal edema. Gastrointestinal system: Abdomen is nondistended, soft and nontender. No organomegaly or masses felt. Normal bowel sounds heard. Central nervous system: Alert and oriented. No focal neurological deficits. Extremities: Symmetric 5 x 5 power. Skin: No rashes, lesions or ulcers Psychiatry: Judgement and insight appear normal. Mood & affect appropriate.     Data Reviewed: I have personally reviewed following labs and imaging studies  Micro Results No results found for  this or any previous visit (from the past 240 hour(s)).  Radiology Reports Dg Chest 2 View  Result Date: 02/10/2017 CLINICAL DATA:  Shortness of breath. EXAM: CHEST  2 VIEW COMPARISON:  02/09/2017 and prior exams FINDINGS: Cardiomegaly and left-sided pacemaker/ ICD again noted. Pulmonary vascular congestion and minimal interstitial edema again noted. There is no evidence of pleural effusion or pneumothorax. No acute bony abnormalities are present. There has been little interval change since prior study. IMPRESSION: Unchanged appearance of the chest with cardiomegaly and minimal interstitial pulmonary edema. Electronically Signed   By: Margarette Canada M.D.   On: 02/10/2017 07:19   Dg Chest 2 View  Result Date: 02/09/2017 CLINICAL DATA:  Shortness of Breath EXAM: CHEST  2 VIEW COMPARISON:  Chest radiograph November 15, 2016 and chest CT November 15, 2016 FINDINGS: The areas of patchy airspace opacity noted previously have resolve. There is currently no airspace consolidation or appreciable edema. There is cardiomegaly with pulmonary venous hypertension. Pacemaker leads are attached to the right atrium, right ventricle, and coronary sinus. There are total shoulder replacements bilaterally. No bone lesions. No adenopathy. IMPRESSION: Pulmonary vascular congestion without frank edema or consolidation. Electronically Signed   By: Lowella Grip III M.D.   On: 02/09/2017 12:06   Dg Abd 1 View  Result Date: 02/09/2017 CLINICAL DATA:  Abdominal pain/distension, constipation EXAM: ABDOMEN - 1 VIEW COMPARISON:  None. FINDINGS: Nonobstructive bowel gas pattern. Mild degenerative changes of the lumbar spine. IMPRESSION: Unremarkable abdominal radiograph. Electronically Signed   By: Julian Hy M.D.   On: 02/09/2017 21:24     CBC  Recent Labs Lab 02/09/17 1222 02/09/17 1652 02/10/17 0522  WBC 5.1 6.6 6.8  HGB 10.6* 10.8* 10.5*  HCT 31.8* 33.6* 32.9*  PLT 289 315 287  MCV 93.3 93.1 92.9  MCH 31.1 29.9 29.7   MCHC 33.3 32.1 31.9  RDW 15.7* 16.1* 16.0*  LYMPHSABS 1.0  --  2.1  MONOABS 0.4  --  0.3  EOSABS 0.1  --  0.2  BASOSABS 0.0  --  0.0    Chemistries   Recent Labs Lab 02/09/17 1222 02/09/17 1652 02/10/17 0522  NA 139  --  138  K 3.9  --  3.4*  CL 106  --  103  CO2 25  --  25  GLUCOSE 136*  --  127*  BUN 16  --  16  CREATININE 1.44* 1.38* 1.40*  CALCIUM 9.0  --  9.0  MG  --  1.8  --   AST 24  --   --   ALT 16  --   --   ALKPHOS 67  --   --   BILITOT 0.7  --   --    ------------------------------------------------------------------------------------------------------------------ estimated creatinine clearance is 43.5 mL/min (A) (by C-G formula based on SCr of 1.4 mg/dL (H)). ------------------------------------------------------------------------------------------------------------------ No results for input(s): HGBA1C in the last 72 hours. ------------------------------------------------------------------------------------------------------------------ No results for input(s): CHOL, HDL, LDLCALC, TRIG, CHOLHDL, LDLDIRECT in the last 72 hours. ------------------------------------------------------------------------------------------------------------------  No results for input(s): TSH, T4TOTAL, T3FREE, THYROIDAB in the last 72 hours.  Invalid input(s): FREET3 ------------------------------------------------------------------------------------------------------------------ No results for input(s): VITAMINB12, FOLATE, FERRITIN, TIBC, IRON, RETICCTPCT in the last 72 hours.  Coagulation profile No results for input(s): INR, PROTIME in the last 168 hours.  No results for input(s): DDIMER in the last 72 hours.  Cardiac Enzymes  Recent Labs Lab 02/09/17 1652 02/09/17 2129 02/10/17 0522  TROPONINI 0.04* 0.04* 0.05*   ------------------------------------------------------------------------------------------------------------------ Invalid input(s):  POCBNP   CBG:  Recent Labs Lab 02/09/17 1647 02/09/17 2053 02/10/17 0732  GLUCAP 111* 132* 151*       Studies: Dg Chest 2 View  Result Date: 02/10/2017 CLINICAL DATA:  Shortness of breath. EXAM: CHEST  2 VIEW COMPARISON:  02/09/2017 and prior exams FINDINGS: Cardiomegaly and left-sided pacemaker/ ICD again noted. Pulmonary vascular congestion and minimal interstitial edema again noted. There is no evidence of pleural effusion or pneumothorax. No acute bony abnormalities are present. There has been little interval change since prior study. IMPRESSION: Unchanged appearance of the chest with cardiomegaly and minimal interstitial pulmonary edema. Electronically Signed   By: Margarette Canada M.D.   On: 02/10/2017 07:19   Dg Chest 2 View  Result Date: 02/09/2017 CLINICAL DATA:  Shortness of Breath EXAM: CHEST  2 VIEW COMPARISON:  Chest radiograph November 15, 2016 and chest CT November 15, 2016 FINDINGS: The areas of patchy airspace opacity noted previously have resolve. There is currently no airspace consolidation or appreciable edema. There is cardiomegaly with pulmonary venous hypertension. Pacemaker leads are attached to the right atrium, right ventricle, and coronary sinus. There are total shoulder replacements bilaterally. No bone lesions. No adenopathy. IMPRESSION: Pulmonary vascular congestion without frank edema or consolidation. Electronically Signed   By: Lowella Grip III M.D.   On: 02/09/2017 12:06   Dg Abd 1 View  Result Date: 02/09/2017 CLINICAL DATA:  Abdominal pain/distension, constipation EXAM: ABDOMEN - 1 VIEW COMPARISON:  None. FINDINGS: Nonobstructive bowel gas pattern. Mild degenerative changes of the lumbar spine. IMPRESSION: Unremarkable abdominal radiograph. Electronically Signed   By: Julian Hy M.D.   On: 02/09/2017 21:24      No results found for: HGBA1C Lab Results  Component Value Date   CREATININE 1.40 (H) 02/10/2017       Scheduled Meds: . amiodarone   200 mg Oral Daily  . carvedilol  25 mg Oral BID WC  . enoxaparin (LOVENOX) injection  40 mg Subcutaneous Q24H  . furosemide  60 mg Intravenous BID  . hydrALAZINE  12.5 mg Oral Daily  . insulin aspart  0-15 Units Subcutaneous TID WC  . pantoprazole  40 mg Oral Daily  . potassium chloride  10 mEq Oral QODAY  . rOPINIRole  0.5 mg Oral QHS  . sertraline  150 mg Oral Daily  . simvastatin  20 mg Oral Daily  . sodium chloride flush  3 mL Intravenous Q12H  . spironolactone  25 mg Oral Daily   Continuous Infusions: . sodium chloride       LOS: 0 days    Time spent: >30 MINS    Reyne Dumas  [facility] Pager [phone]. If 7PM-7AM, please contact night-coverage at [email or web address], password [facility] 02/10/2017, 8:19 AM  LOS: 0 days

## 2017-02-12 DIAGNOSIS — N183 Chronic kidney disease, stage 3 (moderate): Secondary | ICD-10-CM

## 2017-02-12 DIAGNOSIS — I5023 Acute on chronic systolic (congestive) heart failure: Secondary | ICD-10-CM

## 2017-02-12 LAB — GLUCOSE, CAPILLARY
GLUCOSE-CAPILLARY: 110 mg/dL — AB (ref 65–99)
GLUCOSE-CAPILLARY: 133 mg/dL — AB (ref 65–99)
Glucose-Capillary: 155 mg/dL — ABNORMAL HIGH (ref 65–99)
Glucose-Capillary: 130 mg/dL — ABNORMAL HIGH (ref 65–99)

## 2017-02-12 LAB — COMPREHENSIVE METABOLIC PANEL
ALBUMIN: 3.6 g/dL (ref 3.5–5.0)
ALT: 17 U/L (ref 14–54)
AST: 24 U/L (ref 15–41)
Alkaline Phosphatase: 63 U/L (ref 38–126)
Anion gap: 11 (ref 5–15)
BILIRUBIN TOTAL: 0.3 mg/dL (ref 0.3–1.2)
BUN: 35 mg/dL — AB (ref 6–20)
CO2: 20 mmol/L — ABNORMAL LOW (ref 22–32)
CREATININE: 1.68 mg/dL — AB (ref 0.44–1.00)
Calcium: 8.9 mg/dL (ref 8.9–10.3)
Chloride: 110 mmol/L (ref 101–111)
GFR calc Af Amer: 38 mL/min — ABNORMAL LOW (ref >60)
GFR, EST NON AFRICAN AMERICAN: 33 mL/min — AB (ref >60)
GLUCOSE: 139 mg/dL — AB (ref 65–99)
Potassium: 4.9 mmol/L (ref 3.5–5.1)
Sodium: 141 mmol/L (ref 135–145)
TOTAL PROTEIN: 6.9 g/dL (ref 6.5–8.1)

## 2017-02-12 LAB — CBC
HEMATOCRIT: 30.6 % — AB (ref 36.0–46.0)
Hemoglobin: 9.6 g/dL — ABNORMAL LOW (ref 12.0–15.0)
MCH: 29.4 pg (ref 26.0–34.0)
MCHC: 31.4 g/dL (ref 30.0–36.0)
MCV: 93.6 fL (ref 78.0–100.0)
PLATELETS: 276 10*3/uL (ref 150–400)
RBC: 3.27 MIL/uL — ABNORMAL LOW (ref 3.87–5.11)
RDW: 16.5 % — AB (ref 11.5–15.5)
WBC: 4.4 10*3/uL (ref 4.0–10.5)

## 2017-02-12 MED ORDER — METOLAZONE 2.5 MG PO TABS
2.5000 mg | ORAL_TABLET | Freq: Once | ORAL | Status: AC
  Administered 2017-02-12: 2.5 mg via ORAL
  Filled 2017-02-12: qty 1

## 2017-02-12 NOTE — Progress Notes (Signed)
ReDs Vest reading completed.  The reading value is 40%.

## 2017-02-12 NOTE — Progress Notes (Signed)
Heart Failure Navigator Consult Note  Presentation: Per Dr. Haroldine Laws Michelle Andrade is a 56 year old female with a past medical history of HTN, chronic systolic CHF s/p BI V ICD ([employer]),atrial tachycardia (2017), NICM (normal cors in 2014), CKD stage III, HLD, DM, OSA w/CPAP, and anemia.  She was admitted 12/26/16-12/27/16 with volume overload at [facility]. Echo that admission with EF 25-30%. She was discharged on Spiro 25mg  daily, Coreg 25mg  BID,and hydralazine 12.5mg  daily. Discharge weight was 177 pounds.   Her baseline creatinine appears to be 1.4-1.6 when reviewing her records from [facility]. Her last office visit with her Cardiologist Dr. Otho Perl was in January. He noted angioedema with ACE-I and cough with ARB.   She presented to the ED on 02/09/17 with SOB and abdominal bloating. Pertinent admission labs - creatinine 1.44, BNP 634, chest x ray with cardiomegaly, no frank edema. She was started on IV lasix, weight down ~ 2 pounds.   Michelle Andrade tells me that she has been feeling bad for about a week. On the Friday night prior to admission, she ate an entire bag of popcorn, drinks more than 2L a day. When we talked about her diet, she does not cook and eats out for every meal. She does report adherence to her medication regimen. She just graduated from [employer] with a degree in [employer]. She does not weigh herself daily at home, but is aware when she is volume overloaded when she gets bloating in her abdomen. Gets SOB with walking throughout her house, SOB with stairs.   Past Medical History:  Diagnosis Date  . Arthritis   . CHF (congestive heart failure) ([place])   . Diabetes mellitus without complication ([place])   . Gout   . High cholesterol   . Hypertension   . Myocardial infarct ([place])   . Sleep apnea     Social History   Social History  . Marital status: Single    Spouse name: N/A  . Number of children: N/A  . Years of education: N/A   Social History  Main Topics  . Smoking status: Former Research scientist (life sciences)  . Smokeless tobacco: Never Used  . Alcohol use No  . Drug use: No  . Sexual activity: Not Asked   Other Topics Concern  . None   Social History Narrative  . None    ECHO:Study Conclusions--02/10/17  - Left ventricle: The cavity size was moderately dilated. Wall   thickness was normal. The estimated ejection fraction was 20%.   Diffuse hypokinesis. Doppler parameters are consistent with both   elevated ventricular end-diastolic filling pressure and elevated   left atrial filling pressure. - Aortic valve: There was trivial regurgitation. - Mitral valve: There was moderate to severe regurgitation. - Left atrium: The atrium was moderately dilated. - Atrial septum: A patent foramen ovale cannot be excluded. - Tricuspid valve: There was moderate regurgitation. - Pulmonary arteries: PA peak pressure: 80 mm Hg (S). - Pericardium, extracardiac: Small pericardial effusion no   tamponade.  ------------------------------------------------------------------- Study data:  No prior study was available for comparison.  Study status:  Routine.  Procedure:  The patient reported no pain pre or Andrade test. Transthoracic echocardiography. Image quality was adequate.  Study completion:  There were no complications. Transthoracic echocardiography.  M-mode, complete 2D, spectral Doppler, and color Doppler.  Birthdate:  Patient birthdate: 1960/09/09.  Age:  Patient is 56 yr old.  Sex:  Gender: female. BMI: 30.7 kg/m^2.  Blood pressure:  125/80  Patient status: Inpatient.  Study date:  Study date: 02/10/2017. Study time: 03:00 PM.  Location:  Echo laboratory.   BNP    Component Value Date/Time   BNP 634.3 (H) 02/09/2017 1223    ProBNP No results found for: PROBNP   Education Assessment and Provision:  Detailed education and instructions provided on heart failure disease management including the following:  Signs and symptoms of Heart  Failure When to call the physician Importance of daily weights Low sodium diet Fluid restriction Medication management Anticipated future follow-up appointments  Patient education given on each of the above topics.  Patient acknowledges understanding and acceptance of all instructions.  I spoke with Michelle Andrade regarding her HF.  She tells me that she has known that she has had a weak heart and "low EF".  She admits that she does not weigh everyday and has been eating poorly (high sodium foods).  I reinforced the need for daily weights and when to contact the physician.  We discussed a low sodium diet and high sodium foods to avoid.  She tells me that she eats out for most meals.  She does say that her sister cooks and she can make changes to avoid high sodium foods.  She denies any issues getting or taking prescribed medications.  She also tells me that she does not use her CPAP and I encouraged compliance with that at home.  She will follow with the [facility] after discharge.  Education Materials:  "Living Better With Heart Failure" Booklet, Daily Weight Tracker Tool   High Risk Criteria for Readmission and/or Poor Patient Outcomes:   EF <30%- yes 20%  2 or more admissions in 6 months-No  Difficult social situation-Denies  Demonstrates medication noncompliance-Denies   Barriers of Care:  Knowledge and compliance  Discharge Planning:  Plans to return to home with sister.  She is appropriate and will benefit from the HF [employer].

## 2017-02-12 NOTE — Progress Notes (Signed)
Advanced Heart Failure Rounding Note    Primary Cardiologist: Dr. Otho Perl  Subjective:    Admitted 02/09/17 with acute on chronic CHF. Diuresed with IV lasix, weight unchanged, diuresis sluggish.   Feels well today. Denies SOB, abdominal bloating improved but still present. Wants more education about heart failure.   Objective:   Weight Range: 178 lb (80.7 kg) Body mass index is 31.53 kg/m.   Vital Signs:   Temp:  [97.5 F (36.4 C)-98.2 F (36.8 C)] 98.2 F (36.8 C) (06/12 0544) Pulse Rate:  [64-77] 77 (06/12 0544) Resp:  [16] 16 (06/11 2125) BP: (105-132)/(54-83) 132/83 (06/12 0544) SpO2:  [99 %-100 %] 99 % (06/12 0544) Weight:  [178 lb (80.7 kg)] 178 lb (80.7 kg) (06/12 0544) Last BM Date: 02/09/17  Weight change: Filed Weights   02/10/17 0528 02/11/17 0514 02/12/17 0544  Weight: 173 lb 4.8 oz (78.6 kg) 176 lb 6.4 oz (80 kg) 178 lb (80.7 kg)    Intake/Output:   Intake/Output Summary (Last 24 hours) at 02/12/17 0645 Last data filed at 02/12/17 0617  Gross per 24 hour  Intake             1215 ml  Output             1400 ml  Net             -185 ml     Physical Exam: General:  Well appearing female. No resp difficulty HEENT: normal Neck: supple. JVP 7-8 cm . Carotids 2+ bilat; no bruits. No lymphadenopathy or thyromegaly appreciated. Cor: PMI nondisplaced. Regular rate & rhythm. No rubs, gallops or murmurs. Lungs: clear in upper lobes, scattered expiratory wheezes.  Abdomen: soft, nontender, + distended. No hepatosplenomegaly. No bruits or masses. Good bowel sounds. Extremities: no cyanosis, clubbing, rash. Warm. No peripheral edema.  Neuro: alert & orientedx3, cranial nerves grossly intact. moves all 4 extremities w/o difficulty. Affect pleasant   Telemetry: V paced.   Labs: CBC  Recent Labs  02/09/17 1222 02/09/17 1652 02/10/17 0522  WBC 5.1 6.6 6.8  NEUTROABS 3.6  --  4.1  HGB 10.6* 10.8* 10.5*  HCT 31.8* 33.6* 32.9*  MCV 93.3 93.1 92.9  PLT  289 315 992   Basic Metabolic Panel  Recent Labs  02/09/17 1652 02/10/17 0522 02/11/17 0236  NA  --  138 137  K  --  3.4* 4.1  CL  --  103 103  CO2  --  25 24  GLUCOSE  --  127* 159*  BUN  --  16 35*  CREATININE 1.38* 1.40* 1.99*  CALCIUM  --  9.0 9.0  MG 1.8  --   --    Liver Function Tests  Recent Labs  02/09/17 1222 02/11/17 0236  AST 24 21  ALT 16 16  ALKPHOS 67 68  BILITOT 0.7 0.5  PROT 7.3 6.6  ALBUMIN 3.7 3.4*    Recent Labs  02/09/17 1222  LIPASE 18   Cardiac Enzymes  Recent Labs  02/09/17 1652 02/09/17 2129 02/10/17 0522  TROPONINI 0.04* 0.04* 0.05*    BNP: BNP (last 3 results)  Recent Labs  11/15/16 1348 02/09/17 1223  BNP 451.0* 634.3*    ProBNP (last 3 results) No results for input(s): PROBNP in the last 8760 hours.   D-Dimer No results for input(s): DDIMER in the last 72 hours. Hemoglobin A1C  Recent Labs  02/10/17 1448  HGBA1C 6.2*   Fasting Lipid Panel No results for input(s): CHOL, HDL,  LDLCALC, TRIG, CHOLHDL, LDLDIRECT in the last 72 hours. Thyroid Function Tests No results for input(s): TSH, T4TOTAL, T3FREE, THYROIDAB in the last 72 hours.  Invalid input(s): FREET3  Transthoracic Echocardiography 02/10/17 Study Conclusions  - Left ventricle: The cavity size was moderately dilated. Wall   thickness was normal. The estimated ejection fraction was 20%.   Diffuse hypokinesis. Doppler parameters are consistent with both   elevated ventricular end-diastolic filling pressure and elevated   left atrial filling pressure. - Aortic valve: There was trivial regurgitation. - Mitral valve: There was moderate to severe regurgitation. - Left atrium: The atrium was moderately dilated. - Atrial septum: A patent foramen ovale cannot be excluded. - Tricuspid valve: There was moderate regurgitation. - Pulmonary arteries: PA peak pressure: 80 mm Hg (S). - Pericardium, extracardiac: Small pericardial effusion no    tamponade.    Medications:     Scheduled Medications: . amiodarone  200 mg Oral Daily  . carvedilol  25 mg Oral BID WC  . enoxaparin (LOVENOX) injection  40 mg Subcutaneous Q24H  . furosemide  80 mg Intravenous BID  . hydrALAZINE  25 mg Oral Q8H  . insulin aspart  0-15 Units Subcutaneous TID WC  . isosorbide mononitrate  60 mg Oral Daily  . pantoprazole  40 mg Oral Daily  . potassium chloride  40 mEq Oral BID  . rOPINIRole  0.5 mg Oral QHS  . sertraline  150 mg Oral Daily  . simvastatin  20 mg Oral Daily  . sodium chloride flush  3 mL Intravenous Q12H  . spironolactone  25 mg Oral Daily     Infusions: . sodium chloride       PRN Medications:  sodium chloride, acetaminophen, hydrALAZINE, LORazepam, ondansetron (ZOFRAN) IV, sodium chloride flush   Assessment/Plan  1. Acute on chronic systolic CHF: NICM, normal cors in 2014, likely related to HTN vs. Noncompliance. EF 20%, PA pressure 80.  - NYHA II-III - Remains volume overloaded, continue IV lasix 80mg  BID today, give 2.5mg  metolazone.   - No Entresto with history of angioedema.  - Continue Spiro 25mg  daily.   - Continue isosorbide 60mg  daily.  - Continue hydralazine 25mg  TID.  - Continue Coreg 25mg  BID.  - Nurse navigator will meet with her today.   - Consider RHC +/- inpatient once volume improves.  - Will stop KCl, K is 4.9 today. 2. CKD stage III: Baseline creatinine 1.3-1.6.  - BMET pending today.  3. Bi V ICD  4. HTN - Well controlled on current regiment - no change to current plan.  5. History of atrial tachycardia - On Amiodarone 200 mg daily - Continue current medical management.   Length of Stay: [place], NP  02/12/2017, 6:45 AM  Advanced Heart Failure Team Pager [phone] (M-F; [place])  Please contact [facility] for night-coverage after hours (4p -7a ) and weekends on [email or web address]  Patient seen and examined with Jettie Booze, NP. We discussed all aspects of the encounter. I agree  with the assessment and plan as stated above.   Feels better today but weight not changed much. Volume status hard to assess on exam. ReDS vest placed and lung water still elevated at 40% (normal 20-35%). Will continue IV diuresis and add metolazone. If not making progress overnight will plan RHC tomorrow. BP stable. Creatinine stable - watch closely for AKI,. K is 4.9 will stop supplemental K.   Extensive education provided by HF team and HF Nurse Navigator.  Glori Bickers, MD  9:44 PM

## 2017-02-12 NOTE — Progress Notes (Signed)
[facility] PROGRESS NOTE  Michelle Andrade OVF:[id number] DOB: Jul 21, 1961 DOA: 02/09/2017   PCP: Henderson Baltimore, [name]     Assessment/Plan: Principal Problem:   Pulmonary vascular congestion Active Problems:   CHF (congestive heart failure) ([place])   Hypertension   Sleep apnea   56 y.o. female presents for hospital follow up. Was hospitalized at [facility] 12/26/16-12/27/16. for exacerbation of CHF. She was admitted to the hospital with shortness of breath increased fatigue and abdominal distention for month.  elevated proBNP with chest x-ray findings of cardiomegaly with mild central pulmonary vascular congestion. Patient was therefore admitted  for acute on chronic systolic congestive heart failure. Patient was fluid restricted her started on IV Lasix patient improved rapidly. Repeat echocardiogram today showed moderate to severe mitral regurgitation with an EF of 25-30% . HF team following   Assessment and plan Acute on chronic systolic heart failure,MR Repeat 2-D echo shows EF of 20%, moderate to severe mitral regurgitation pulmonary hypertension Serial troponin, 0.04 >0.05 Continue Diuresis per cardiology, Lasix held 6/11 due to bump in cr but then restarted    Continue Arlyce Harman 25mg  daily.   - Continue isosorbide 60mg  daily.  - Continue hydralazine 25mg  TID.  - Continue Coreg 25mg  BID.  Patient allergic to ACE inhibitor/ARB HF team  Following  May need RHC     CK D stage III-baseline creatinine 1.4-1.6, creatinine increased to 1.99>1.68 now     History of atrial tachycardia Biventricular ICD (implantable cardioverter-defibrillator) Without any history of atrial fibrillation Currently on amiodarone    Hypokalemia-repleted  Dyslipidemia-continue statin  DM SSI ACHS,   hemoglobin A1c  6.2 Hold metformin   Chronic pain syndrome Hold flexeril and mobic  Hypertension, continue medications as above for heart failure When necessary hydralazine 10 mg IV as needed  for severe blood pressure Cont hydrlazine  OSA CPAP  GERD Cont PPI  RLS Cont requip  Mood disorder Con zoloft Continue low-dose Ativan for anxiety    Iron deficiency anemia Hemoglobin stable around 10.5>9.6   DVT prophylaxsis  Lovenox  Code Status:  Full code     Family Communication: Discussed in detail with the patient, all imaging results, lab results explained to the patient   Disposition Plan- plan per cardiology      Consultants:Cardiology  procedures:  None   Antibiotics: Anti-infectives    None         HPI/Subjective: Still has abdominal bloating,denies nausea     02/09/17 1644 02/09/17 1755 02/09/17 1948 02/10/17 0528  BP:  (!) 141/79 128/73 (!) 153/90  Pulse:  89 79 78  Resp:   18 18  Temp:   98.4 F (36.9 C) 97.6 F (36.4 C)  TempSrc:   Oral Oral  SpO2:   97% 100%  Weight: 79.5 kg (175 lb 4.8 oz)   78.6 kg (173 lb 4.8 oz)  Height:        Intake/Output Summary (Last 24 hours) at 02/10/17 0819 Last data filed at 02/10/17 0600  Gross per 24 hour  Intake              960 ml  Output             2901 ml  Net            -1941 ml    Exam:  Examination:  General exam: Appears calm and comfortable  Respiratory system:  . Respiratory effort improved, some  wheezing  Cardiovascular system: S1 & S2 heard,  RRR. No JVD, murmurs, rubs, gallops or clicks. No pedal edema. Gastrointestinal system: Abdomen is nondistended, soft and nontender. No organomegaly or masses felt. Normal bowel sounds heard. Central nervous system: Alert and oriented. No focal neurological deficits. Extremities: Symmetric 5 x 5 power. Skin: No rashes, lesions or ulcers Psychiatry: Judgement and insight appear normal. Mood & affect appropriate.     Data Reviewed: I have personally reviewed following labs and imaging studies  Micro Results No results found for this or any previous visit (from the past 240 hour(s)).  Radiology Reports Dg Chest 2  View  Result Date: 02/10/2017 CLINICAL DATA:  Shortness of breath. EXAM: CHEST  2 VIEW COMPARISON:  02/09/2017 and prior exams FINDINGS: Cardiomegaly and left-sided pacemaker/ ICD again noted. Pulmonary vascular congestion and minimal interstitial edema again noted. There is no evidence of pleural effusion or pneumothorax. No acute bony abnormalities are present. There has been little interval change since prior study. IMPRESSION: Unchanged appearance of the chest with cardiomegaly and minimal interstitial pulmonary edema. Electronically Signed   By: Margarette Canada M.D.   On: 02/10/2017 07:19   Dg Chest 2 View  Result Date: 02/09/2017 CLINICAL DATA:  Shortness of Breath EXAM: CHEST  2 VIEW COMPARISON:  Chest radiograph November 15, 2016 and chest CT November 15, 2016 FINDINGS: The areas of patchy airspace opacity noted previously have resolve. There is currently no airspace consolidation or appreciable edema. There is cardiomegaly with pulmonary venous hypertension. Pacemaker leads are attached to the right atrium, right ventricle, and coronary sinus. There are total shoulder replacements bilaterally. No bone lesions. No adenopathy. IMPRESSION: Pulmonary vascular congestion without frank edema or consolidation. Electronically Signed   By: Lowella Grip III M.D.   On: 02/09/2017 12:06   Dg Abd 1 View  Result Date: 02/09/2017 CLINICAL DATA:  Abdominal pain/distension, constipation EXAM: ABDOMEN - 1 VIEW COMPARISON:  None. FINDINGS: Nonobstructive bowel gas pattern. Mild degenerative changes of the lumbar spine. IMPRESSION: Unremarkable abdominal radiograph. Electronically Signed   By: Julian Hy M.D.   On: 02/09/2017 21:24     CBC  Recent Labs Lab 02/09/17 1222 02/09/17 1652 02/10/17 0522  WBC 5.1 6.6 6.8  HGB 10.6* 10.8* 10.5*  HCT 31.8* 33.6* 32.9*  PLT 289 315 287  MCV 93.3 93.1 92.9  MCH 31.1 29.9 29.7  MCHC 33.3 32.1 31.9  RDW 15.7* 16.1* 16.0*  LYMPHSABS 1.0  --  2.1  MONOABS 0.4  --   0.3  EOSABS 0.1  --  0.2  BASOSABS 0.0  --  0.0    Chemistries   Recent Labs Lab 02/09/17 1222 02/09/17 1652 02/10/17 0522  NA 139  --  138  K 3.9  --  3.4*  CL 106  --  103  CO2 25  --  25  GLUCOSE 136*  --  127*  BUN 16  --  16  CREATININE 1.44* 1.38* 1.40*  CALCIUM 9.0  --  9.0  MG  --  1.8  --   AST 24  --   --   ALT 16  --   --   ALKPHOS 67  --   --   BILITOT 0.7  --   --    ------------------------------------------------------------------------------------------------------------------ estimated creatinine clearance is 43.5 mL/min (A) (by C-G formula based on SCr of 1.4 mg/dL (H)). ------------------------------------------------------------------------------------------------------------------ No results for input(s): HGBA1C in the last 72 hours. ------------------------------------------------------------------------------------------------------------------ No results for input(s): CHOL, HDL, LDLCALC, TRIG, CHOLHDL, LDLDIRECT in the last 72 hours. ------------------------------------------------------------------------------------------------------------------ No results  for input(s): TSH, T4TOTAL, T3FREE, THYROIDAB in the last 72 hours.  Invalid input(s): FREET3 ------------------------------------------------------------------------------------------------------------------ No results for input(s): VITAMINB12, FOLATE, FERRITIN, TIBC, IRON, RETICCTPCT in the last 72 hours.  Coagulation profile No results for input(s): INR, PROTIME in the last 168 hours.  No results for input(s): DDIMER in the last 72 hours.  Cardiac Enzymes  Recent Labs Lab 02/09/17 1652 02/09/17 2129 02/10/17 0522  TROPONINI 0.04* 0.04* 0.05*   ------------------------------------------------------------------------------------------------------------------ Invalid input(s): POCBNP   CBG:  Recent Labs Lab 02/09/17 1647 02/09/17 2053 02/10/17 0732  GLUCAP 111* 132* 151*        Studies: Dg Chest 2 View  Result Date: 02/10/2017 CLINICAL DATA:  Shortness of breath. EXAM: CHEST  2 VIEW COMPARISON:  02/09/2017 and prior exams FINDINGS: Cardiomegaly and left-sided pacemaker/ ICD again noted. Pulmonary vascular congestion and minimal interstitial edema again noted. There is no evidence of pleural effusion or pneumothorax. No acute bony abnormalities are present. There has been little interval change since prior study. IMPRESSION: Unchanged appearance of the chest with cardiomegaly and minimal interstitial pulmonary edema. Electronically Signed   By: Margarette Canada M.D.   On: 02/10/2017 07:19   Dg Chest 2 View  Result Date: 02/09/2017 CLINICAL DATA:  Shortness of Breath EXAM: CHEST  2 VIEW COMPARISON:  Chest radiograph November 15, 2016 and chest CT November 15, 2016 FINDINGS: The areas of patchy airspace opacity noted previously have resolve. There is currently no airspace consolidation or appreciable edema. There is cardiomegaly with pulmonary venous hypertension. Pacemaker leads are attached to the right atrium, right ventricle, and coronary sinus. There are total shoulder replacements bilaterally. No bone lesions. No adenopathy. IMPRESSION: Pulmonary vascular congestion without frank edema or consolidation. Electronically Signed   By: Lowella Grip III M.D.   On: 02/09/2017 12:06   Dg Abd 1 View  Result Date: 02/09/2017 CLINICAL DATA:  Abdominal pain/distension, constipation EXAM: ABDOMEN - 1 VIEW COMPARISON:  None. FINDINGS: Nonobstructive bowel gas pattern. Mild degenerative changes of the lumbar spine. IMPRESSION: Unremarkable abdominal radiograph. Electronically Signed   By: Julian Hy M.D.   On: 02/09/2017 21:24      No results found for: HGBA1C Lab Results  Component Value Date   CREATININE 1.40 (H) 02/10/2017       Scheduled Meds: . amiodarone  200 mg Oral Daily  . carvedilol  25 mg Oral BID WC  . enoxaparin (LOVENOX) injection  40 mg Subcutaneous  Q24H  . furosemide  60 mg Intravenous BID  . hydrALAZINE  12.5 mg Oral Daily  . insulin aspart  0-15 Units Subcutaneous TID WC  . pantoprazole  40 mg Oral Daily  . potassium chloride  10 mEq Oral QODAY  . rOPINIRole  0.5 mg Oral QHS  . sertraline  150 mg Oral Daily  . simvastatin  20 mg Oral Daily  . sodium chloride flush  3 mL Intravenous Q12H  . spironolactone  25 mg Oral Daily   Continuous Infusions: . sodium chloride       LOS: 0 days    Time spent: >30 MINS    Reyne Dumas  [facility] Pager [phone]. If 7PM-7AM, please contact night-coverage at [email or web address], password [facility] 02/10/2017, 8:19 AM  LOS: 0 days

## 2017-02-12 NOTE — Progress Notes (Signed)
Per Pt bath has been done. Tech gave bath supplies to Pt and Pt completed bath.

## 2017-02-13 ENCOUNTER — Encounter (HOSPITAL_COMMUNITY)
Admission: EM | Disposition: A | Discharge status: Home / Self Care | Payer: Self-pay | Source: Home / Self Care | Attending: Internal Medicine

## 2017-02-13 ENCOUNTER — Inpatient Hospital Stay (HOSPITAL_COMMUNITY): Payer: Medicare Other

## 2017-02-13 DIAGNOSIS — I10 Essential (primary) hypertension: Secondary | ICD-10-CM

## 2017-02-13 DIAGNOSIS — N179 Acute kidney failure, unspecified: Secondary | ICD-10-CM

## 2017-02-13 DIAGNOSIS — E1122 Type 2 diabetes mellitus with diabetic chronic kidney disease: Secondary | ICD-10-CM

## 2017-02-13 DIAGNOSIS — R748 Abnormal levels of other serum enzymes: Secondary | ICD-10-CM

## 2017-02-13 HISTORY — PX: RIGHT HEART CATH: CATH118263

## 2017-02-13 LAB — CBC
HCT: 35.2 % — ABNORMAL LOW (ref 36.0–46.0)
Hemoglobin: 11.1 g/dL — ABNORMAL LOW (ref 12.0–15.0)
MCH: 29.2 pg (ref 26.0–34.0)
MCHC: 31.5 g/dL (ref 30.0–36.0)
MCV: 92.6 fL (ref 78.0–100.0)
PLATELETS: 289 10*3/uL (ref 150–400)
RBC: 3.8 MIL/uL — ABNORMAL LOW (ref 3.87–5.11)
RDW: 16.3 % — AB (ref 11.5–15.5)
WBC: 4.6 10*3/uL (ref 4.0–10.5)

## 2017-02-13 LAB — POCT I-STAT 3, VENOUS BLOOD GAS (G3P V)
ACID-BASE EXCESS: 1 mmol/L (ref 0.0–2.0)
Bicarbonate: 26.1 mmol/L (ref 20.0–28.0)
O2 SAT: 54 %
PH VEN: 7.387 (ref 7.250–7.430)
TCO2: 27 mmol/L (ref 0–100)
pCO2, Ven: 43.4 mmHg — ABNORMAL LOW (ref 44.0–60.0)
pO2, Ven: 29 mmHg — CL (ref 32.0–45.0)

## 2017-02-13 LAB — BASIC METABOLIC PANEL
Anion gap: 12 (ref 5–15)
BUN: 39 mg/dL — AB (ref 6–20)
CHLORIDE: 104 mmol/L (ref 101–111)
CO2: 22 mmol/L (ref 22–32)
CREATININE: 1.72 mg/dL — AB (ref 0.44–1.00)
Calcium: 9.3 mg/dL (ref 8.9–10.3)
GFR calc Af Amer: 37 mL/min — ABNORMAL LOW (ref >60)
GFR calc non Af Amer: 32 mL/min — ABNORMAL LOW (ref >60)
Glucose, Bld: 163 mg/dL — ABNORMAL HIGH (ref 65–99)
Potassium: 4.1 mmol/L (ref 3.5–5.1)
SODIUM: 138 mmol/L (ref 135–145)

## 2017-02-13 LAB — RETICULOCYTES
RBC.: 3.48 MIL/uL — ABNORMAL LOW (ref 3.87–5.11)
RETIC CT PCT: 2.5 % (ref 0.4–3.1)
Retic Count, Absolute: 87 10*3/uL (ref 19.0–186.0)

## 2017-02-13 LAB — IRON AND TIBC
Iron: 68 ug/dL (ref 28–170)
SATURATION RATIOS: 19 % (ref 10.4–31.8)
TIBC: 364 ug/dL (ref 250–450)
UIBC: 296 ug/dL

## 2017-02-13 LAB — POCT I-STAT 3, ART BLOOD GAS (G3+)
Acid-Base Excess: 1 mmol/L (ref 0.0–2.0)
Bicarbonate: 25.1 mmol/L (ref 20.0–28.0)
O2 SAT: 95 %
PH ART: 7.424 (ref 7.350–7.450)
TCO2: 26 mmol/L (ref 0–100)
pCO2 arterial: 38.3 mmHg (ref 32.0–48.0)
pO2, Arterial: 74 mmHg — ABNORMAL LOW (ref 83.0–108.0)

## 2017-02-13 LAB — FERRITIN: FERRITIN: 126 ng/mL (ref 11–307)

## 2017-02-13 LAB — GLUCOSE, CAPILLARY
GLUCOSE-CAPILLARY: 117 mg/dL — AB (ref 65–99)
Glucose-Capillary: 131 mg/dL — ABNORMAL HIGH (ref 65–99)
Glucose-Capillary: 114 mg/dL — ABNORMAL HIGH (ref 65–99)
Glucose-Capillary: 181 mg/dL — ABNORMAL HIGH (ref 65–99)

## 2017-02-13 LAB — PROTIME-INR
INR: 0.93
Prothrombin Time: 12.5 seconds (ref 11.4–15.2)

## 2017-02-13 LAB — VITAMIN B12: Vitamin B-12: 455 pg/mL (ref 180–914)

## 2017-02-13 LAB — FOLATE: Folate: 32.5 ng/mL (ref >5.9)

## 2017-02-13 LAB — CREATININE, SERUM
CREATININE: 1.69 mg/dL — AB (ref 0.44–1.00)
GFR calc Af Amer: 37 mL/min — ABNORMAL LOW (ref >60)
GFR, EST NON AFRICAN AMERICAN: 32 mL/min — AB (ref >60)

## 2017-02-13 SURGERY — RIGHT HEART CATH
Anesthesia: LOCAL

## 2017-02-13 MED ORDER — LIDOCAINE HCL (PF) 1 % IJ SOLN
INTRAMUSCULAR | Status: AC
  Filled 2017-02-13: qty 30

## 2017-02-13 MED ORDER — MIDAZOLAM HCL 2 MG/2ML IJ SOLN
INTRAMUSCULAR | Status: AC
  Filled 2017-02-13: qty 2

## 2017-02-13 MED ORDER — FENTANYL CITRATE (PF) 100 MCG/2ML IJ SOLN
INTRAMUSCULAR | Status: AC
  Filled 2017-02-13: qty 2

## 2017-02-13 MED ORDER — SODIUM CHLORIDE 0.9 % IV SOLN: 250.0000 mL | INTRAVENOUS | Status: DC | PRN

## 2017-02-13 MED ORDER — SODIUM CHLORIDE 0.9 % IV SOLN
INTRAVENOUS | Status: AC | PRN
  Administered 2017-02-13: 10 mL/h via INTRAVENOUS

## 2017-02-13 MED ORDER — MIDAZOLAM HCL 2 MG/2ML IJ SOLN
INTRAMUSCULAR | Status: DC | PRN
  Administered 2017-02-13 (×2): 1 mg via INTRAVENOUS

## 2017-02-13 MED ORDER — SODIUM CHLORIDE 0.9 % IV SOLN: INTRAVENOUS | Status: DC

## 2017-02-13 MED ORDER — ONDANSETRON HCL 4 MG/2ML IJ SOLN: 4.0000 mg | Freq: Four times a day (QID) | INTRAMUSCULAR | Status: DC | PRN

## 2017-02-13 MED ORDER — HEPARIN (PORCINE) IN NACL 2-0.9 UNIT/ML-% IJ SOLN
INTRAMUSCULAR | Status: AC
  Filled 2017-02-13: qty 500

## 2017-02-13 MED ORDER — FUROSEMIDE 10 MG/ML IJ SOLN
80.0000 mg | Freq: Once | INTRAMUSCULAR | Status: AC
  Administered 2017-02-13: 80 mg via INTRAVENOUS

## 2017-02-13 MED ORDER — LIDOCAINE HCL (PF) 1 % IJ SOLN
INTRAMUSCULAR | Status: DC | PRN
  Administered 2017-02-13 (×2): 15 mL

## 2017-02-13 MED ORDER — FUROSEMIDE 10 MG/ML IJ SOLN
INTRAMUSCULAR | Status: AC
Start: 2017-02-13 — End: 2017-02-13
  Filled 2017-02-13: qty 4

## 2017-02-13 MED ORDER — ENOXAPARIN SODIUM 40 MG/0.4ML ~~LOC~~ SOLN
40.0000 mg | SUBCUTANEOUS | Status: DC
  Filled 2017-02-13: qty 0.4

## 2017-02-13 MED ORDER — SODIUM CHLORIDE 0.9% FLUSH
3.0000 mL | Freq: Two times a day (BID) | INTRAVENOUS | Status: DC
  Administered 2017-02-14: 3 mL via INTRAVENOUS

## 2017-02-13 MED ORDER — FENTANYL CITRATE (PF) 100 MCG/2ML IJ SOLN
INTRAMUSCULAR | Status: DC | PRN
  Administered 2017-02-13: 25 ug via INTRAVENOUS

## 2017-02-13 MED ORDER — SODIUM CHLORIDE 0.9% FLUSH: 3.0000 mL | INTRAVENOUS | Status: DC | PRN

## 2017-02-13 MED ORDER — ACETAMINOPHEN 325 MG PO TABS: 650.0000 mg | ORAL_TABLET | ORAL | Status: DC | PRN

## 2017-02-13 MED ORDER — FUROSEMIDE 10 MG/ML IJ SOLN
INTRAMUSCULAR | Status: AC
  Filled 2017-02-13: qty 4

## 2017-02-13 MED ORDER — HEPARIN (PORCINE) IN NACL 2-0.9 UNIT/ML-% IJ SOLN
INTRAMUSCULAR | Status: AC | PRN
  Administered 2017-02-13: 500 mL

## 2017-02-13 SURGICAL SUPPLY — 8 items
CATH SWAN GANZ 7F STRAIGHT (CATHETERS) ×2 IMPLANT
COVER PRB 48X5XTLSCP FOLD TPE (BAG) ×1 IMPLANT
COVER PROBE 5X48 (BAG) ×1
PACK CARDIAC CATHETERIZATION (CUSTOM PROCEDURE TRAY) ×2 IMPLANT
PROTECTION STATION PRESSURIZED (MISCELLANEOUS) ×2
SHEATH PINNACLE 7F 10CM (SHEATH) ×2 IMPLANT
STATION PROTECTION PRESSURIZED (MISCELLANEOUS) ×1 IMPLANT
TRANSDUCER W/STOPCOCK (MISCELLANEOUS) ×2 IMPLANT

## 2017-02-13 NOTE — H&P (View-Only) (Signed)
Advanced Heart Failure Rounding Note    Primary Cardiologist: Dr. Otho Perl  Subjective:    Admitted 02/09/17 with acute on chronic CHF.  Weight down ~3 pounds total. Creatinine 1.38->1.40->1.99->1.68->1.72  Feels well, denies chest pain and SOB. Abdomen feels less tight.   Objective:   Weight Range: 175 lb 3.2 oz (79.5 kg) Body mass index is 31.04 kg/m.   Vital Signs:   Temp:  [97.4 F (36.3 C)-98.6 F (37 C)] 97.4 F (36.3 C) (06/13 0628) Pulse Rate:  [63-65] 63 (06/12 2012) Resp:  [16-20] 16 (06/13 0628) BP: (113-128)/(58-72) 128/72 (06/13 0628) SpO2:  [97 %-100 %] 100 % (06/13 0628) Weight:  [175 lb 3.2 oz (79.5 kg)] 175 lb 3.2 oz (79.5 kg) (06/13 0628) Last BM Date: 02/12/17  Weight change: Filed Weights   02/11/17 0514 02/12/17 0544 02/13/17 0628  Weight: 176 lb 6.4 oz (80 kg) 178 lb (80.7 kg) 175 lb 3.2 oz (79.5 kg)    Intake/Output:   Intake/Output Summary (Last 24 hours) at 02/13/17 0742 Last data filed at 02/13/17 7622  Gross per 24 hour  Intake             1060 ml  Output             3400 ml  Net            -2340 ml     Physical Exam: General: Well appearing female. NAD. Sitting on edge of the bed.  HEENT: normal Neck: supple. JVP 5-6 cm. Carotids 2+ bilat; no bruits. No thyromegaly or nodule noted. Cor: PMI nondisplaced. RRR, No M/G/R noted Lungs: CTAB, normal effort. Abdomen: soft, non-tender, distention improved, no HSM. No bruits or masses. +BS  Extremities: no cyanosis, clubbing, rash, R and LLE no edema.  Neuro: alert & orientedx3, cranial nerves grossly intact. moves all 4 extremities w/o difficulty. Affect pleasant   Telemetry: V paced.   Labs: CBC  Recent Labs  02/12/17 0547  WBC 4.4  HGB 9.6*  HCT 30.6*  MCV 93.6  PLT 633   Basic Metabolic Panel  Recent Labs  02/11/17 0236 02/12/17 0547  NA 137 141  K 4.1 4.9  CL 103 110  CO2 24 20*  GLUCOSE 159* 139*  BUN 35* 35*  CREATININE 1.99* 1.68*  CALCIUM 9.0 8.9    Liver Function Tests  Recent Labs  02/11/17 0236 02/12/17 0547  AST 21 24  ALT 16 17  ALKPHOS 68 63  BILITOT 0.5 0.3  PROT 6.6 6.9  ALBUMIN 3.4* 3.6   No results for input(s): LIPASE, AMYLASE in the last 72 hours. Cardiac Enzymes No results for input(s): CKTOTAL, CKMB, CKMBINDEX, TROPONINI in the last 72 hours.  BNP: BNP (last 3 results)  Recent Labs  11/15/16 1348 02/09/17 1223  BNP 451.0* 634.3*    ProBNP (last 3 results) No results for input(s): PROBNP in the last 8760 hours.   D-Dimer No results for input(s): DDIMER in the last 72 hours. Hemoglobin A1C  Recent Labs  02/10/17 1448  HGBA1C 6.2*   Fasting Lipid Panel No results for input(s): CHOL, HDL, LDLCALC, TRIG, CHOLHDL, LDLDIRECT in the last 72 hours. Thyroid Function Tests No results for input(s): TSH, T4TOTAL, T3FREE, THYROIDAB in the last 72 hours.  Invalid input(s): FREET3  Transthoracic Echocardiography 02/10/17 Study Conclusions  - Left ventricle: The cavity size was moderately dilated. Wall   thickness was normal. The estimated ejection fraction was 20%.   Diffuse hypokinesis. Doppler parameters are consistent with both  elevated ventricular end-diastolic filling pressure and elevated   left atrial filling pressure. - Aortic valve: There was trivial regurgitation. - Mitral valve: There was moderate to severe regurgitation. - Left atrium: The atrium was moderately dilated. - Atrial septum: A patent foramen ovale cannot be excluded. - Tricuspid valve: There was moderate regurgitation. - Pulmonary arteries: PA peak pressure: 80 mm Hg (S). - Pericardium, extracardiac: Small pericardial effusion no   tamponade.    Medications:     Scheduled Medications: . amiodarone  200 mg Oral Daily  . carvedilol  25 mg Oral BID WC  . enoxaparin (LOVENOX) injection  40 mg Subcutaneous Q24H  . furosemide  80 mg Intravenous BID  . hydrALAZINE  25 mg Oral Q8H  . insulin aspart  0-15 Units  Subcutaneous TID WC  . isosorbide mononitrate  60 mg Oral Daily  . pantoprazole  40 mg Oral Daily  . rOPINIRole  0.5 mg Oral QHS  . sertraline  150 mg Oral Daily  . simvastatin  20 mg Oral Daily  . sodium chloride flush  3 mL Intravenous Q12H  . spironolactone  25 mg Oral Daily    Infusions: . sodium chloride      PRN Medications: sodium chloride, acetaminophen, hydrALAZINE, LORazepam, ondansetron (ZOFRAN) IV, sodium chloride flush   Assessment/Plan  1. Acute on chronic systolic CHF: NICM, normal cors in 2014, likely related to HTN vs. Noncompliance. EF 20%, PA pressure 80.  - NYHA II-III - Volume improving, but exam is difficult. Continue IV lasix 80mg  BID + metolazone today.  - No Entresto with history of angioedema.  - Continue Spiro 25mg  daily.  - Continue isosorbide 60mg  daily.  - Continue hydralazine 25mg  TID.  - Continue Coreg 25mg  BID.  - May need RHC to assess hemodynamics further, she is -4L for admission, and weight is really only down 3 pounds.  2. CKD stage III: Baseline creatinine 1.3-1.6.  -creatinine slightly bumped today.  3. Bi V ICD  4. HTN - Well controlled on current regimen.  5. History of atrial tachycardia - Continue Amio 200mg  daily.    Length of Stay: [place], NP  02/13/2017, 7:42 AM  Advanced Heart Failure Team Pager [phone] (M-F; 7a - 4p)  Please contact [facility] for night-coverage after hours (4p -7a ) and weekends on [email or web address]  Patient seen and examined with Jettie Booze, NP. We discussed all aspects of the encounter. I agree with the assessment and plan as stated above.   Volume status remains elevated. Renal function slightly worse. Will need RHC today to assess hemodynamics and output prior to d/c.   Glori Bickers, MD  10:43 AM

## 2017-02-13 NOTE — Progress Notes (Signed)
Pt is alert and oriented  with teach Back success. Plan to have Cath done today or tomorrow.

## 2017-02-13 NOTE — Progress Notes (Signed)
   Acute dyspnea in the post cath. SOB at rest. Coughing.  O2 sats 100 % on 2 liters.   Give 80 mg IV lasix now. Stat CXR.   Amon Costilla NP-C  4:57 PM

## 2017-02-13 NOTE — Progress Notes (Signed)
Advanced Heart Failure Rounding Note    Primary Cardiologist: Dr. Otho Perl  Subjective:    Admitted 02/09/17 with acute on chronic CHF.  Weight down ~3 pounds total. Creatinine 1.38->1.40->1.99->1.68->1.72  Feels well, denies chest pain and SOB. Abdomen feels less tight.   Objective:   Weight Range: 175 lb 3.2 oz (79.5 kg) Body mass index is 31.04 kg/m.   Vital Signs:   Temp:  [97.4 F (36.3 C)-98.6 F (37 C)] 97.4 F (36.3 C) (06/13 0628) Pulse Rate:  [63-65] 63 (06/12 2012) Resp:  [16-20] 16 (06/13 0628) BP: (113-128)/(58-72) 128/72 (06/13 0628) SpO2:  [97 %-100 %] 100 % (06/13 0628) Weight:  [175 lb 3.2 oz (79.5 kg)] 175 lb 3.2 oz (79.5 kg) (06/13 0628) Last BM Date: 02/12/17  Weight change: Filed Weights   02/11/17 0514 02/12/17 0544 02/13/17 0628  Weight: 176 lb 6.4 oz (80 kg) 178 lb (80.7 kg) 175 lb 3.2 oz (79.5 kg)    Intake/Output:   Intake/Output Summary (Last 24 hours) at 02/13/17 0742 Last data filed at 02/13/17 3329  Gross per 24 hour  Intake             1060 ml  Output             3400 ml  Net            -2340 ml     Physical Exam: General: Well appearing female. NAD. Sitting on edge of the bed.  HEENT: normal Neck: supple. JVP 5-6 cm. Carotids 2+ bilat; no bruits. No thyromegaly or nodule noted. Cor: PMI nondisplaced. RRR, No M/G/R noted Lungs: CTAB, normal effort. Abdomen: soft, non-tender, distention improved, no HSM. No bruits or masses. +BS  Extremities: no cyanosis, clubbing, rash, R and LLE no edema.  Neuro: alert & orientedx3, cranial nerves grossly intact. moves all 4 extremities w/o difficulty. Affect pleasant   Telemetry: V paced.   Labs: CBC  Recent Labs  02/12/17 0547  WBC 4.4  HGB 9.6*  HCT 30.6*  MCV 93.6  PLT 518   Basic Metabolic Panel  Recent Labs  02/11/17 0236 02/12/17 0547  NA 137 141  K 4.1 4.9  CL 103 110  CO2 24 20*  GLUCOSE 159* 139*  BUN 35* 35*  CREATININE 1.99* 1.68*  CALCIUM 9.0 8.9    Liver Function Tests  Recent Labs  02/11/17 0236 02/12/17 0547  AST 21 24  ALT 16 17  ALKPHOS 68 63  BILITOT 0.5 0.3  PROT 6.6 6.9  ALBUMIN 3.4* 3.6   No results for input(s): LIPASE, AMYLASE in the last 72 hours. Cardiac Enzymes No results for input(s): CKTOTAL, CKMB, CKMBINDEX, TROPONINI in the last 72 hours.  BNP: BNP (last 3 results)  Recent Labs  11/15/16 1348 02/09/17 1223  BNP 451.0* 634.3*    ProBNP (last 3 results) No results for input(s): PROBNP in the last 8760 hours.   D-Dimer No results for input(s): DDIMER in the last 72 hours. Hemoglobin A1C  Recent Labs  02/10/17 1448  HGBA1C 6.2*   Fasting Lipid Panel No results for input(s): CHOL, HDL, LDLCALC, TRIG, CHOLHDL, LDLDIRECT in the last 72 hours. Thyroid Function Tests No results for input(s): TSH, T4TOTAL, T3FREE, THYROIDAB in the last 72 hours.  Invalid input(s): FREET3  Transthoracic Echocardiography 02/10/17 Study Conclusions  - Left ventricle: The cavity size was moderately dilated. Wall   thickness was normal. The estimated ejection fraction was 20%.   Diffuse hypokinesis. Doppler parameters are consistent with both  elevated ventricular end-diastolic filling pressure and elevated   left atrial filling pressure. - Aortic valve: There was trivial regurgitation. - Mitral valve: There was moderate to severe regurgitation. - Left atrium: The atrium was moderately dilated. - Atrial septum: A patent foramen ovale cannot be excluded. - Tricuspid valve: There was moderate regurgitation. - Pulmonary arteries: PA peak pressure: 80 mm Hg (S). - Pericardium, extracardiac: Small pericardial effusion no   tamponade.    Medications:     Scheduled Medications: . amiodarone  200 mg Oral Daily  . carvedilol  25 mg Oral BID WC  . enoxaparin (LOVENOX) injection  40 mg Subcutaneous Q24H  . furosemide  80 mg Intravenous BID  . hydrALAZINE  25 mg Oral Q8H  . insulin aspart  0-15 Units  Subcutaneous TID WC  . isosorbide mononitrate  60 mg Oral Daily  . pantoprazole  40 mg Oral Daily  . rOPINIRole  0.5 mg Oral QHS  . sertraline  150 mg Oral Daily  . simvastatin  20 mg Oral Daily  . sodium chloride flush  3 mL Intravenous Q12H  . spironolactone  25 mg Oral Daily    Infusions: . sodium chloride      PRN Medications: sodium chloride, acetaminophen, hydrALAZINE, LORazepam, ondansetron (ZOFRAN) IV, sodium chloride flush   Assessment/Plan  1. Acute on chronic systolic CHF: NICM, normal cors in 2014, likely related to HTN vs. Noncompliance. EF 20%, PA pressure 80.  - NYHA II-III - Volume improving, but exam is difficult. Continue IV lasix 80mg  BID + metolazone today.  - No Entresto with history of angioedema.  - Continue Spiro 25mg  daily.  - Continue isosorbide 60mg  daily.  - Continue hydralazine 25mg  TID.  - Continue Coreg 25mg  BID.  - May need RHC to assess hemodynamics further, she is -4L for admission, and weight is really only down 3 pounds.  2. CKD stage III: Baseline creatinine 1.3-1.6.  -creatinine slightly bumped today.  3. Bi V ICD  4. HTN - Well controlled on current regimen.  5. History of atrial tachycardia - Continue Amio 200mg  daily.    Length of Stay: [place], NP  02/13/2017, 7:42 AM  Advanced Heart Failure Team Pager [phone] (M-F; 7a - 4p)  Please contact [facility] for night-coverage after hours (4p -7a ) and weekends on [email or web address]  Patient seen and examined with Jettie Booze, NP. We discussed all aspects of the encounter. I agree with the assessment and plan as stated above.   Volume status remains elevated. Renal function slightly worse. Will need RHC today to assess hemodynamics and output prior to d/c.   Glori Bickers, MD  10:43 AM

## 2017-02-13 NOTE — Progress Notes (Signed)
Pt is alert and oriented with gas and bowel sounds tolerated a clear liquid.

## 2017-02-13 NOTE — Progress Notes (Signed)
PROGRESS NOTE    Michelle Andrade  LFY:[id number] DOB: 09/20/1960 DOA: 02/09/2017 PCP: Henderson Baltimore, [name]   Brief Narrative: Michelle Andrade is a 56 y.o. female with a history of CHF, diabetes, gout, hypertension, MI, sleep apnea. She presented with shortness of breath and was found to have an acute CHF exacerbation. Exacerbation likely secondary to dietary indiscretion. She has been diuresed by heart failure team. Plan is for right heart cath.   Assessment & Plan:   Principal Problem:   Pulmonary vascular congestion Active Problems:   CHF (congestive heart failure) (HCC)   Hypertension   Sleep apnea   Constipated   Acute on chronic systolic (congestive) heart failure (HCC)   CKD (chronic kidney disease), stage III   Acute systolic heart failure EF of 25-30%. Symptoms improved with Lasix. Weight is down 3 pounds from yesterday. -Heart failure team recommendations: Right heart cath, continue IV diuresis -Continue spironolactone 25 mg daily -Continue isosorbide nitrate 60 mg daily -Continue hydralazine 25 mg 3 times a day -Continue Coreg 25 mg twice a day  CKD stage III Baseline creatinine of 1.4-1.6. Has improved diuresis. -Follow BMP while diuresing  History of atrial tachycardia She is status post ICD -Continue amiodarone  Hypokalemia Stable.  Hyperlipidemia Stable. -Continue simvastatin  Diabetes mellitus with hyperglycemia On metformin as an outpatient. Fasting blood sugar slightly above goal. -Continue SSI  Chronic pain syndrome -Continue   Essential hypertension Controlled and stable. -medications as above  Obstructive sleep apnea -CPAP qhs  GERD -Continue PPI  Restless leg syndrome -Continue Requip  Mood disorder -Continue Zoloft and Ativan  Anemia Unknown etiology. Normocytic. No evidence of bleeding. -anemia panel   DVT prophylaxis: Lovenox Code Status: Full code Family Communication: None at bedside Disposition Plan: Discharge  pending clinical improvement per cardiology recommendatinos   Consultants:   Heart failure  Procedures:   None  Antimicrobials:  None    Subjective: Heart failure symptoms appear improved overall. Patient reports no dyspnea at rest. She has baseline dyspnea on exertion. No chest pain. Abdominal swelling improved. No leg swelling. Diuretic has been helping symptoms greatly.  Objective: Vitals:   02/12/17 0544 02/12/17 1204 02/12/17 2012 02/13/17 0628  BP: 132/83 113/71 (!) 116/58 128/72  Pulse: 77 65 63   Resp:  20 19 16   Temp: 98.2 F (36.8 C) 98.4 F (36.9 C) 98.6 F (37 C) 97.4 F (36.3 C)  TempSrc: Oral Oral Oral Oral  SpO2: 99% 100% 97% 100%  Weight: 80.7 kg (178 lb)   79.5 kg (175 lb 3.2 oz)  Height:        Intake/Output Summary (Last 24 hours) at 02/13/17 1237 Last data filed at 02/13/17 0915  Gross per 24 hour  Intake             1560 ml  Output             3400 ml  Net            -1840 ml   Filed Weights   02/11/17 0514 02/12/17 0544 02/13/17 0628  Weight: 80 kg (176 lb 6.4 oz) 80.7 kg (178 lb) 79.5 kg (175 lb 3.2 oz)    Examination:  General exam: Appears calm and comfortable Respiratory system: Clear to auscultation. Respiratory effort normal. Cardiovascular system: S1 & S2 heard, RRR. No murmurs Gastrointestinal system: Abdomen is nondistended, soft and nontender. Normal bowel sounds heard. Central nervous system: Alert and oriented. No focal neurological deficits. Extremities: No edema. No calf tenderness Skin: No  cyanosis. No rashes Psychiatry: Judgement and insight appear normal. Mood & affect appropriate.     Data Reviewed: I have personally reviewed following labs and imaging studies  CBC:  Recent Labs Lab 02/09/17 1222 02/09/17 1652 02/10/17 0522 02/12/17 0547  WBC 5.1 6.6 6.8 4.4  NEUTROABS 3.6  --  4.1  --   HGB 10.6* 10.8* 10.5* 9.6*  HCT 31.8* 33.6* 32.9* 30.6*  MCV 93.3 93.1 92.9 93.6  PLT 289 315 287 299   Basic  Metabolic Panel:  Recent Labs Lab 02/09/17 1222 02/09/17 1652 02/10/17 0522 02/11/17 0236 02/12/17 0547 02/13/17 0801  NA 139  --  138 137 141 138  K 3.9  --  3.4* 4.1 4.9 4.1  CL 106  --  103 103 110 104  CO2 25  --  25 24 20* 22  GLUCOSE 136*  --  127* 159* 139* 163*  BUN 16  --  16 35* 35* 39*  CREATININE 1.44* 1.38* 1.40* 1.99* 1.68* 1.72*  CALCIUM 9.0  --  9.0 9.0 8.9 9.3  MG  --  1.8  --   --   --   --    GFR: Estimated Creatinine Clearance: 35.6 mL/min (A) (by C-G formula based on SCr of 1.72 mg/dL (H)). Liver Function Tests:  Recent Labs Lab 02/09/17 1222 02/11/17 0236 02/12/17 0547  AST 24 21 24   ALT 16 16 17   ALKPHOS 67 68 63  BILITOT 0.7 0.5 0.3  PROT 7.3 6.6 6.9  ALBUMIN 3.7 3.4* 3.6    Recent Labs Lab 02/09/17 1222  LIPASE 18   No results for input(s): AMMONIA in the last 168 hours. Coagulation Profile: No results for input(s): INR, PROTIME in the last 168 hours. Cardiac Enzymes:  Recent Labs Lab 02/09/17 1222 02/09/17 1652 02/09/17 2129 02/10/17 0522  TROPONINI 0.04* 0.04* 0.04* 0.05*   BNP (last 3 results) No results for input(s): PROBNP in the last 8760 hours. HbA1C:  Recent Labs  02/10/17 1448  HGBA1C 6.2*   CBG:  Recent Labs Lab 02/12/17 1202 02/12/17 1558 02/12/17 2227 02/13/17 0748 02/13/17 1121  GLUCAP 110* 133* 130* 131* 114*   Lipid Profile: No results for input(s): CHOL, HDL, LDLCALC, TRIG, CHOLHDL, LDLDIRECT in the last 72 hours. Thyroid Function Tests: No results for input(s): TSH, T4TOTAL, FREET4, T3FREE, THYROIDAB in the last 72 hours. Anemia Panel: No results for input(s): VITAMINB12, FOLATE, FERRITIN, TIBC, IRON, RETICCTPCT in the last 72 hours. Sepsis Labs: No results for input(s): PROCALCITON, LATICACIDVEN in the last 168 hours.  No results found for this or any previous visit (from the past 240 hour(s)).       Radiology Studies: No results found.      Scheduled Meds: . amiodarone  200 mg  Oral Daily  . carvedilol  25 mg Oral BID WC  . enoxaparin (LOVENOX) injection  40 mg Subcutaneous Q24H  . furosemide  80 mg Intravenous BID  . hydrALAZINE  25 mg Oral Q8H  . insulin aspart  0-15 Units Subcutaneous TID WC  . isosorbide mononitrate  60 mg Oral Daily  . pantoprazole  40 mg Oral Daily  . rOPINIRole  0.5 mg Oral QHS  . sertraline  150 mg Oral Daily  . simvastatin  20 mg Oral Daily  . sodium chloride flush  3 mL Intravenous Q12H  . spironolactone  25 mg Oral Daily   Continuous Infusions: . sodium chloride       LOS: 3 days     Michelle Ala  Lonny Prude, MD [facility] 02/13/2017, 12:37 PM Pager: [phone]  If 7PM-7AM, please contact night-coverage [email or web address] Password [facility] 02/13/2017, 12:37 PM

## 2017-02-13 NOTE — Interval H&P Note (Signed)
History and Physical Interval Note:  02/13/2017 3:54 PM  Michelle Andrade  has presented today for surgery, with the diagnosis of hf  The various methods of treatment have been discussed with the patient and family. After consideration of risks, benefits and other options for treatment, the patient has consented to  Procedure(s): Right Heart Cath (N/A) as a surgical intervention .  The patient's history has been reviewed, patient examined, no change in status, stable for surgery.  I have reviewed the patient's chart and labs.  Questions were answered to the patient's satisfaction.     Assad Harbeson, Quillian Quince

## 2017-02-13 NOTE — Progress Notes (Signed)
Site area: Right groin a 7 french venous sheath was removed   Site Prior to Removal:  Level 0  Pressure Applied For 15 MINUTES    Bedrest Beginning at 1730p  Manual:   Yes.    Patient Status During Pull:  stable  Post Pull Groin Site:  Level 0  Post Pull Instructions Given:  Yes.    Post Pull Pulses Present:  Yes.    Dressing Applied:  Yes.    Comments:  VS remain stable during sheath pull

## 2017-02-13 NOTE — Progress Notes (Signed)
Pt off to cath Lab Consent signed no aspirin ordered or given. Called Cath Lab to make aware.

## 2017-02-14 ENCOUNTER — Encounter (HOSPITAL_COMMUNITY): Payer: Self-pay | Admitting: Internal Medicine

## 2017-02-14 DIAGNOSIS — I5021 Acute systolic (congestive) heart failure: Secondary | ICD-10-CM

## 2017-02-14 DIAGNOSIS — I509 Heart failure, unspecified: Secondary | ICD-10-CM

## 2017-02-14 LAB — BASIC METABOLIC PANEL
Anion gap: 11 (ref 5–15)
BUN: 45 mg/dL — ABNORMAL HIGH (ref 6–20)
CALCIUM: 9 mg/dL (ref 8.9–10.3)
CO2: 26 mmol/L (ref 22–32)
CREATININE: 2.35 mg/dL — AB (ref 0.44–1.00)
Chloride: 101 mmol/L (ref 101–111)
GFR, EST AFRICAN AMERICAN: 25 mL/min — AB (ref >60)
GFR, EST NON AFRICAN AMERICAN: 22 mL/min — AB (ref >60)
Glucose, Bld: 152 mg/dL — ABNORMAL HIGH (ref 65–99)
Potassium: 4.4 mmol/L (ref 3.5–5.1)
SODIUM: 138 mmol/L (ref 135–145)

## 2017-02-14 LAB — POCT I-STAT 3, VENOUS BLOOD GAS (G3P V)
ACID-BASE EXCESS: 1 mmol/L (ref 0.0–2.0)
BICARBONATE: 26.5 mmol/L (ref 20.0–28.0)
O2 Saturation: 54 %
PCO2 VEN: 43.4 mmHg — AB (ref 44.0–60.0)
PO2 VEN: 29 mmHg — AB (ref 32.0–45.0)
TCO2: 28 mmol/L (ref 0–100)
pH, Ven: 7.394 (ref 7.250–7.430)

## 2017-02-14 LAB — GLUCOSE, CAPILLARY
GLUCOSE-CAPILLARY: 273 mg/dL — AB (ref 65–99)
GLUCOSE-CAPILLARY: 137 mg/dL — AB (ref 65–99)
Glucose-Capillary: 110 mg/dL — ABNORMAL HIGH (ref 65–99)
Glucose-Capillary: 127 mg/dL — ABNORMAL HIGH (ref 65–99)

## 2017-02-14 MED ORDER — GUAIFENESIN-DM 100-10 MG/5ML PO SYRP: 5.0000 mL | ORAL_SOLUTION | ORAL | Status: DC | PRN

## 2017-02-14 MED ORDER — ENOXAPARIN SODIUM 30 MG/0.3ML ~~LOC~~ SOLN
30.0000 mg | SUBCUTANEOUS | Status: DC
  Filled 2017-02-14: qty 0.3

## 2017-02-14 MED ORDER — CARVEDILOL 12.5 MG PO TABS
12.5000 mg | ORAL_TABLET | Freq: Two times a day (BID) | ORAL | Status: DC
  Administered 2017-02-14 – 2017-02-15 (×3): 12.5 mg via ORAL
  Filled 2017-02-14 (×3): qty 1

## 2017-02-14 NOTE — Research (Signed)
Michelle Andrade is being evaluated for the [facility] Failure Research Study.  Dr. Aundra Dubin is aware of potential enrollment and agrees with the patient being part of the study.  The Study information and informed consent were explained to the participant by [name] Paden, Rollen Sox, RN.  The Informed Consent Form was left at the bedside in order to provide adequate time for the patient to review the form and consider participation in this study.  The patient was given the opportunity to ask questions and all questions were answered to their satisfiaction.  No study related tests or procedures completed at this time.  Will follow up with the patient tomorrow.

## 2017-02-14 NOTE — Progress Notes (Signed)
PROGRESS NOTE    Michelle Andrade  ZOX:[id number] DOB: 04-01-1961 DOA: 02/09/2017 PCP: Henderson Baltimore, [name]   Brief Narrative: Michelle Andrade is a 56 y.o. female with a history of CHF, diabetes, gout, hypertension, MI, sleep apnea. She presented with shortness of breath and was found to have an acute CHF exacerbation. Exacerbation likely secondary to dietary indiscretion. She has been diuresed by heart failure team. Plan is for right heart cath.   Assessment & Plan:   Principal Problem:   Acute on chronic systolic (congestive) heart failure (HCC) Active Problems:   CHF (congestive heart failure) (HCC)   Pulmonary vascular congestion   Hypertension   Sleep apnea   Constipated   CKD (chronic kidney disease), stage III   Type 2 diabetes mellitus with stage 3 chronic kidney disease (HCC)   Acute systolic heart failure EF of 25-30%. Symptoms improved with Lasix. Weight is down 3 pounds from yesterday. RHC good. -Heart failure team recommendations: pending for today but anticipate cutting back on diuresis secondary to worsening kidney function -Continue spironolactone 25 mg daily -Continue isosorbide nitrate 60 mg daily -Continue hydralazine 25 mg 3 times a day -Continue Coreg 25 mg twice a day  CKD stage III Baseline creatinine of 1.4-1.6. Worsening likely secondary to overdiuresis. -diuresis per heart failure team  History of atrial tachycardia She is status post ICD -Continue amiodarone  Hypokalemia Resolved and stable.  Hyperlipidemia Stable. -Continue simvastatin  Diabetes mellitus with hyperglycemia On metformin as an outpatient. Fasting blood sugar above goal. -Continue SSI -may need to use insulin in the setting of CKD, honestly. Will consider glipizide instead.  Chronic pain syndrome -Continue mobic  Essential hypertension Controlled and stable. -medications as above  Obstructive sleep apnea -CPAP qhs  GERD -Continue PPI  Restless leg  syndrome -Continue Requip  Mood disorder -Continue Zoloft and Ativan  Anemia Unknown etiology. Normocytic. No evidence of bleeding. -anemia panel   DVT prophylaxis: Lovenox Code Status: Full code Family Communication: None at bedside Disposition Plan: Discharge pending clinical improvement per cardiology recommendatinos   Consultants:   Heart failure  Procedures:   None  Antimicrobials:  None    Subjective: No chest pain or dyspnea. Patient feels ready to go. RHC went well.  Objective: Vitals:   02/13/17 1730 02/13/17 2139 02/14/17 0138 02/14/17 0528  BP: 124/67 113/63 (!) 106/59 118/68  Pulse: 60 75 70 78  Resp: 10   14  Temp:  98.4 F (36.9 C)    TempSrc:  Oral    SpO2: 99% 98% 93% 95%  Weight:    78.3 kg (172 lb 9.6 oz)  Height:        Intake/Output Summary (Last 24 hours) at 02/14/17 1257 Last data filed at 02/14/17 0900  Gross per 24 hour  Intake              600 ml  Output             2650 ml  Net            -2050 ml   Filed Weights   02/12/17 0544 02/13/17 0628 02/14/17 0528  Weight: 80.7 kg (178 lb) 79.5 kg (175 lb 3.2 oz) 78.3 kg (172 lb 9.6 oz)    Examination:  General exam: Appears calm and comfortable Respiratory system: Clear to auscultation. Respiratory effort normal. Cardiovascular system: S1 & S2 heard, RRR. No murmurs Gastrointestinal system: Abdomen is nondistended, soft and nontender. Normal bowel sounds heard. Central nervous system: Alert and oriented. No  focal neurological deficits. Extremities: No edema. No calf tenderness Skin: No cyanosis. No rashes Psychiatry: Judgement and insight appear normal. Mood & affect appropriate.  Exam unchanged from 02/13/17    Data Reviewed: I have personally reviewed following labs and imaging studies  CBC:  Recent Labs Lab 02/09/17 1222 02/09/17 1652 02/10/17 0522 02/12/17 0547 02/13/17 1939  WBC 5.1 6.6 6.8 4.4 4.6  NEUTROABS 3.6  --  4.1  --   --   HGB 10.6* 10.8* 10.5*  9.6* 11.1*  HCT 31.8* 33.6* 32.9* 30.6* 35.2*  MCV 93.3 93.1 92.9 93.6 92.6  PLT 289 315 287 276 409   Basic Metabolic Panel:  Recent Labs Lab 02/09/17 1652 02/10/17 0522 02/11/17 0236 02/12/17 0547 02/13/17 0801 02/13/17 1939 02/14/17 0155  NA  --  138 137 141 138  --  138  K  --  3.4* 4.1 4.9 4.1  --  4.4  CL  --  103 103 110 104  --  101  CO2  --  25 24 20* 22  --  26  GLUCOSE  --  127* 159* 139* 163*  --  152*  BUN  --  16 35* 35* 39*  --  45*  CREATININE 1.38* 1.40* 1.99* 1.68* 1.72* 1.69* 2.35*  CALCIUM  --  9.0 9.0 8.9 9.3  --  9.0  MG 1.8  --   --   --   --   --   --    GFR: Estimated Creatinine Clearance: 25.9 mL/min (A) (by C-G formula based on SCr of 2.35 mg/dL (H)). Liver Function Tests:  Recent Labs Lab 02/09/17 1222 02/11/17 0236 02/12/17 0547  AST 24 21 24   ALT 16 16 17   ALKPHOS 67 68 63  BILITOT 0.7 0.5 0.3  PROT 7.3 6.6 6.9  ALBUMIN 3.7 3.4* 3.6    Recent Labs Lab 02/09/17 1222  LIPASE 18   No results for input(s): AMMONIA in the last 168 hours. Coagulation Profile:  Recent Labs Lab 02/13/17 1319  INR 0.93   Cardiac Enzymes:  Recent Labs Lab 02/09/17 1222 02/09/17 1652 02/09/17 2129 02/10/17 0522  TROPONINI 0.04* 0.04* 0.04* 0.05*   BNP (last 3 results) No results for input(s): PROBNP in the last 8760 hours. HbA1C: No results for input(s): HGBA1C in the last 72 hours. CBG:  Recent Labs Lab 02/13/17 1121 02/13/17 1720 02/13/17 2135 02/14/17 0810 02/14/17 1200  GLUCAP 114* 117* 181* 273* 110*   Lipid Profile: No results for input(s): CHOL, HDL, LDLCALC, TRIG, CHOLHDL, LDLDIRECT in the last 72 hours. Thyroid Function Tests: No results for input(s): TSH, T4TOTAL, FREET4, T3FREE, THYROIDAB in the last 72 hours. Anemia Panel:  Recent Labs  02/13/17 1319  VITAMINB12 455  FOLATE 32.5  FERRITIN 126  TIBC 364  IRON 68  RETICCTPCT 2.5   Sepsis Labs: No results for input(s): PROCALCITON, LATICACIDVEN in the last  168 hours.  No results found for this or any previous visit (from the past 240 hour(s)).       Radiology Studies: Dg Chest Port 1 View  Result Date: 02/13/2017 CLINICAL DATA:  Acute shortness of breath EXAM: PORTABLE CHEST 1 VIEW COMPARISON:  02/10/2017 and prior radiographs FINDINGS: Cardiomegaly and left AICD/pacemaker again noted. Mild pulmonary vascular congestion is present. There is no evidence of focal airspace disease, pulmonary edema, suspicious pulmonary nodule/mass, pleural effusion, or pneumothorax. No acute bony abnormalities are identified. Bilateral shoulder arthroplasty changes again noted. IMPRESSION: Cardiomegaly and mild pulmonary vascular congestion. Electronically Signed  By: Margarette Canada M.D.   On: 02/13/2017 17:14        Scheduled Meds: . amiodarone  200 mg Oral Daily  . carvedilol  12.5 mg Oral BID WC  . enoxaparin (LOVENOX) injection  40 mg Subcutaneous Q24H  . hydrALAZINE  25 mg Oral Q8H  . insulin aspart  0-15 Units Subcutaneous TID WC  . isosorbide mononitrate  60 mg Oral Daily  . pantoprazole  40 mg Oral Daily  . rOPINIRole  0.5 mg Oral QHS  . sertraline  150 mg Oral Daily  . simvastatin  20 mg Oral Daily  . sodium chloride flush  3 mL Intravenous Q12H  . sodium chloride flush  3 mL Intravenous Q12H  . spironolactone  25 mg Oral Daily   Continuous Infusions: . sodium chloride    . sodium chloride       LOS: 4 days     Cordelia Poche, MD [facility] 02/14/2017, 12:57 PM Pager: [phone]  If 7PM-7AM, please contact night-coverage [email or web address] Password [facility] 02/14/2017, 12:57 PM

## 2017-02-14 NOTE — Progress Notes (Signed)
Advanced Heart Failure Rounding Note    Primary Cardiologist: Dr. Otho Perl  Subjective:    Admitted 02/09/17 with acute on chronic CHF.  Weight down 6 pounds, RHC yesterday with well compensated filling pressures.     Feels great today. Denies chest pain and SOB.   Objective:   Weight Range: 172 lb 9.6 oz (78.3 kg) Body mass index is 30.57 kg/m.   Vital Signs:   Temp:  [97.1 F (36.2 C)-98.4 F (36.9 C)] 98.4 F (36.9 C) (06/13 2139) Pulse Rate:  [0-88] 78 (06/14 0528) Resp:  [0-24] 14 (06/14 0528) BP: (106-138)/(59-84) 118/68 (06/14 0528) SpO2:  [0 %-100 %] 95 % (06/14 0528) Weight:  [172 lb 9.6 oz (78.3 kg)] 172 lb 9.6 oz (78.3 kg) (06/14 0528) Last BM Date: 02/13/17  Weight change: Filed Weights   02/12/17 0544 02/13/17 0628 02/14/17 0528  Weight: 178 lb (80.7 kg) 175 lb 3.2 oz (79.5 kg) 172 lb 9.6 oz (78.3 kg)    Intake/Output:   Intake/Output Summary (Last 24 hours) at 02/14/17 1257 Last data filed at 02/14/17 0900  Gross per 24 hour  Intake              600 ml  Output             2650 ml  Net            -2050 ml     Physical Exam: General: Well appearing. No resp difficulty. HEENT: normal Neck: supple. JVP 5-6. Carotids 2+ bilat; no bruits. No thyromegaly or nodule noted. Cor: PMI nondisplaced. RRR, No M/G/R noted Lungs: CTAB, normal effort. Abdomen: soft, non-tender, less distended, no HSM. No bruits or masses. +BS  Extremities: no cyanosis, clubbing, rash, R and LLE no edema.  Neuro: alert & orientedx3, cranial nerves grossly intact. moves all 4 extremities w/o difficulty. Affect pleasant    Telemetry: NSR 70-80s V paced. Personally reviewed   Labs: CBC  Recent Labs  02/12/17 0547 02/13/17 1939  WBC 4.4 4.6  HGB 9.6* 11.1*  HCT 30.6* 35.2*  MCV 93.6 92.6  PLT 276 086   Basic Metabolic Panel  Recent Labs  02/13/17 0801 02/13/17 1939 02/14/17 0155  NA 138  --  138  K 4.1  --  4.4  CL 104  --  101  CO2 22  --  26  GLUCOSE  163*  --  152*  BUN 39*  --  45*  CREATININE 1.72* 1.69* 2.35*  CALCIUM 9.3  --  9.0   Liver Function Tests  Recent Labs  02/12/17 0547  AST 24  ALT 17  ALKPHOS 63  BILITOT 0.3  PROT 6.9  ALBUMIN 3.6   No results for input(s): LIPASE, AMYLASE in the last 72 hours. Cardiac Enzymes No results for input(s): CKTOTAL, CKMB, CKMBINDEX, TROPONINI in the last 72 hours.  BNP: BNP (last 3 results)  Recent Labs  11/15/16 1348 02/09/17 1223  BNP 451.0* 634.3*    ProBNP (last 3 results) No results for input(s): PROBNP in the last 8760 hours.    Transthoracic Echocardiography 02/10/17 Study Conclusions  - Left ventricle: The cavity size was moderately dilated. Wall   thickness was normal. The estimated ejection fraction was 20%.   Diffuse hypokinesis. Doppler parameters are consistent with both   elevated ventricular end-diastolic filling pressure and elevated   left atrial filling pressure. - Aortic valve: There was trivial regurgitation. - Mitral valve: There was moderate to severe regurgitation. - Left atrium: The atrium  was moderately dilated. - Atrial septum: A patent foramen ovale cannot be excluded. - Tricuspid valve: There was moderate regurgitation. - Pulmonary arteries: PA peak pressure: 80 mm Hg (S). - Pericardium, extracardiac: Small pericardial effusion no   tamponade.   Right Heart Cath 02/13/17 Findings:  RA = 5 RV = 49/4 PA = 55/18 (35) PCW = 18 Fick cardiac output/index = 4.5/2.5 Thermo CO/CI = 4.5/2.5 PVR = 3.8 WU FA sat = 95% PA sat = 54%, 54%  Assessment: 1. Well-compensated filling pressures 2. Mildly reduced cardiac output     Medications:     Scheduled Medications: . amiodarone  200 mg Oral Daily  . carvedilol  12.5 mg Oral BID WC  . enoxaparin (LOVENOX) injection  40 mg Subcutaneous Q24H  . hydrALAZINE  25 mg Oral Q8H  . insulin aspart  0-15 Units Subcutaneous TID WC  . isosorbide mononitrate  60 mg Oral Daily  .  pantoprazole  40 mg Oral Daily  . rOPINIRole  0.5 mg Oral QHS  . sertraline  150 mg Oral Daily  . simvastatin  20 mg Oral Daily  . sodium chloride flush  3 mL Intravenous Q12H  . sodium chloride flush  3 mL Intravenous Q12H  . spironolactone  25 mg Oral Daily    Infusions: . sodium chloride    . sodium chloride      PRN Medications: sodium chloride, sodium chloride, acetaminophen, hydrALAZINE, LORazepam, ondansetron (ZOFRAN) IV, sodium chloride flush, sodium chloride flush   Assessment/Plan  1. Acute on chronic systolic CHF: NICM, normal cors in 2014, likely related to HTN vs. Noncompliance. EF 20%, PA pressure 80.  - NYHA II - Volume stable to dry. Creatinine bumped today. Will hold diuretics and send home tomorrow if creatinine is better.  - No Entresto with history of angioedema.  - Continue Spiro 25mg  daily.  - Continue isosorbide 60mg  daily.  - Continue hydralazine 25mg  TID.  - Cut Coreg 12.5 mg BID.  - Will need torsemide at home, she was previously on 20mg  daily.  2. Acute on CKD stage III: Baseline creatinine 1.3-1.6.  -Creatinine 2.35 today. Hold diuretics 3. Bi V ICD  4. HTN - Well controlled on current regimen.  5. History of atrial tachycardia - Continue Amio 200mg  daily.   - No atrial tach noted on tele  Set up with paramedicine. She will follow up in our clinic next week. We discussed in detail her dietary restrictions. Likely home tomorrow.   Length of Stay: [place], NP  02/14/2017, 12:57 PM  Advanced Heart Failure Team Pager [phone] (M-F; 7a - 4p)  Please contact [facility] for night-coverage after hours (4p -7a ) and weekends on [email or web address]   Patient seen and examined with Jettie Booze, NP. We discussed all aspects of the encounter. I agree with the assessment and plan as stated above.   Cath results reviewed with her. Volume status looks good.  Creatinine up today so likely dry. Will hold diuretics. Cut b-blocker back.   Glori Bickers, MD  6:17 PM

## 2017-02-15 ENCOUNTER — Encounter (HOSPITAL_COMMUNITY): Payer: Self-pay | Admitting: *Deleted

## 2017-02-15 DIAGNOSIS — K5900 Constipation, unspecified: Secondary | ICD-10-CM

## 2017-02-15 LAB — BASIC METABOLIC PANEL
Anion gap: 11 (ref 5–15)
BUN: 52 mg/dL — AB (ref 6–20)
CALCIUM: 8.9 mg/dL (ref 8.9–10.3)
CHLORIDE: 100 mmol/L — AB (ref 101–111)
CO2: 24 mmol/L (ref 22–32)
CREATININE: 2.03 mg/dL — AB (ref 0.44–1.00)
GFR calc non Af Amer: 26 mL/min — ABNORMAL LOW (ref >60)
GFR, EST AFRICAN AMERICAN: 30 mL/min — AB (ref >60)
Glucose, Bld: 136 mg/dL — ABNORMAL HIGH (ref 65–99)
Potassium: 3.4 mmol/L — ABNORMAL LOW (ref 3.5–5.1)
SODIUM: 135 mmol/L (ref 135–145)

## 2017-02-15 LAB — GLUCOSE, CAPILLARY
GLUCOSE-CAPILLARY: 246 mg/dL — AB (ref 65–99)
Glucose-Capillary: 88 mg/dL (ref 65–99)
Glucose-Capillary: 119 mg/dL — ABNORMAL HIGH (ref 65–99)

## 2017-02-15 MED ORDER — HYDRALAZINE HCL 25 MG PO TABS: 25.0000 mg | ORAL_TABLET | Freq: Three times a day (TID) | ORAL | 0 refills | Status: DC

## 2017-02-15 MED ORDER — ISOSORBIDE MONONITRATE ER 60 MG PO TB24: 60.0000 mg | ORAL_TABLET | Freq: Every day | ORAL | 0 refills | Status: DC

## 2017-02-15 MED ORDER — POTASSIUM CHLORIDE CRYS ER 20 MEQ PO TBCR
40.0000 meq | EXTENDED_RELEASE_TABLET | Freq: Once | ORAL | Status: AC
  Administered 2017-02-15: 40 meq via ORAL
  Filled 2017-02-15: qty 2

## 2017-02-15 MED ORDER — TORSEMIDE 20 MG PO TABS: 40.0000 mg | ORAL_TABLET | Freq: Every day | ORAL | 0 refills | Status: DC

## 2017-02-15 MED ORDER — STUDY - GALACTIC STUDY - PLACEBO / OMECAMTIV MECARBIL (PI-MCLEAN)
1.0000 | Freq: Once | Status: AC
  Administered 2017-02-15: 1 via ORAL
  Filled 2017-02-15: qty 1

## 2017-02-15 MED ORDER — GLIPIZIDE 5 MG PO TABS: 5.0000 mg | ORAL_TABLET | Freq: Every day | ORAL | 0 refills | Status: DC

## 2017-02-15 MED ORDER — CARVEDILOL 12.5 MG PO TABS: 12.5000 mg | ORAL_TABLET | Freq: Two times a day (BID) | ORAL | 0 refills | Status: DC

## 2017-02-15 MED ORDER — POTASSIUM CHLORIDE CRYS ER 20 MEQ PO TBCR: 20.0000 meq | EXTENDED_RELEASE_TABLET | Freq: Every day | ORAL | 0 refills | Status: DC

## 2017-02-15 MED ORDER — STUDY - GALACTIC STUDY - PLACEBO / OMECAMTIV MECARBIL (PI-MCLEAN)
1.0000 | Freq: Two times a day (BID) | Status: DC
  Administered 2017-02-15: 1 via ORAL
  Filled 2017-02-15 (×2): qty 1

## 2017-02-15 NOTE — Progress Notes (Signed)
Advanced Heart Failure Rounding Note    Primary Cardiologist: Dr. Otho Perl  Subjective:    Admitted 02/09/17 with acute on chronic CHF.  Weight up 4 pounds today, she feels ok. Denies SOB, chest pain and orthopnea.   Feels well today, in good spirits. Ready to go home.    Objective:   Weight Range: 176 lb 8 oz (80.1 kg) Body mass index is 31.27 kg/m.   Vital Signs:   Temp:  [97.5 F (36.4 C)-98.1 F (36.7 C)] 98.1 F (36.7 C) (06/15 0555) Pulse Rate:  [63-69] 69 (06/15 0555) Resp:  [18-20] 20 (06/15 0555) BP: (99-118)/(47-66) 118/66 (06/15 0555) SpO2:  [96 %-100 %] 97 % (06/15 0555) Weight:  [176 lb 8 oz (80.1 kg)] 176 lb 8 oz (80.1 kg) (06/15 0325) Last BM Date: 02/13/17  Weight change: Filed Weights   02/13/17 0628 02/14/17 0528 02/15/17 0325  Weight: 175 lb 3.2 oz (79.5 kg) 172 lb 9.6 oz (78.3 kg) 176 lb 8 oz (80.1 kg)    Intake/Output:   Intake/Output Summary (Last 24 hours) at 02/15/17 0813 Last data filed at 02/15/17 0600  Gross per 24 hour  Intake             1080 ml  Output              850 ml  Net              230 ml     Physical Exam: General: Well appearing female. No resp difficulty. HEENT: normal Neck: supple. JVP 5-6. Carotids 2+ bilat; no bruits. No thyromegaly or nodule noted. Cor: PMI nondisplaced. RRR, No M/G/R noted Lungs: CTAB, normal effort. Abdomen: soft, non-tender, mildly distended, no HSM. No bruits or masses. +BS  Extremities: no cyanosis, clubbing, rash, R and LLE no edema.  Neuro: alert & orientedx3, cranial nerves grossly intact. moves all 4 extremities w/o difficulty. Affect pleasant     Telemetry: NSR 70-80's V paced.    Labs: CBC  Recent Labs  02/13/17 1939  WBC 4.6  HGB 11.1*  HCT 35.2*  MCV 92.6  PLT 169   Basic Metabolic Panel  Recent Labs  02/14/17 0155 02/15/17 0311  NA 138 135  K 4.4 3.4*  CL 101 100*  CO2 26 24  GLUCOSE 152* 136*  BUN 45* 52*  CREATININE 2.35* 2.03*  CALCIUM 9.0 8.9     BNP: BNP (last 3 results)  Recent Labs  11/15/16 1348 02/09/17 1223  BNP 451.0* 634.3*    ProBNP (last 3 results) No results for input(s): PROBNP in the last 8760 hours.    Transthoracic Echocardiography 02/10/17 Study Conclusions  - Left ventricle: The cavity size was moderately dilated. Wall   thickness was normal. The estimated ejection fraction was 20%.   Diffuse hypokinesis. Doppler parameters are consistent with both   elevated ventricular end-diastolic filling pressure and elevated   left atrial filling pressure. - Aortic valve: There was trivial regurgitation. - Mitral valve: There was moderate to severe regurgitation. - Left atrium: The atrium was moderately dilated. - Atrial septum: A patent foramen ovale cannot be excluded. - Tricuspid valve: There was moderate regurgitation. - Pulmonary arteries: PA peak pressure: 80 mm Hg (S). - Pericardium, extracardiac: Small pericardial effusion no   tamponade.   Right Heart Cath 02/13/17 Findings:  RA = 5 RV = 49/4 PA = 55/18 (35) PCW = 18 Fick cardiac output/index = 4.5/2.5 Thermo CO/CI = 4.5/2.5 PVR = 3.8 WU FA sat =  95% PA sat = 54%, 54%  Assessment: 1. Well-compensated filling pressures 2. Mildly reduced cardiac output     Medications:     Scheduled Medications: . amiodarone  200 mg Oral Daily  . carvedilol  12.5 mg Oral BID WC  . enoxaparin (LOVENOX) injection  30 mg Subcutaneous Q24H  . hydrALAZINE  25 mg Oral Q8H  . insulin aspart  0-15 Units Subcutaneous TID WC  . isosorbide mononitrate  60 mg Oral Daily  . pantoprazole  40 mg Oral Daily  . potassium chloride  40 mEq Oral Once  . rOPINIRole  0.5 mg Oral QHS  . sertraline  150 mg Oral Daily  . simvastatin  20 mg Oral Daily  . sodium chloride flush  3 mL Intravenous Q12H  . sodium chloride flush  3 mL Intravenous Q12H  . spironolactone  25 mg Oral Daily    Infusions: . sodium chloride    . sodium chloride      PRN  Medications: sodium chloride, sodium chloride, acetaminophen, guaiFENesin-dextromethorphan, hydrALAZINE, LORazepam, ondansetron (ZOFRAN) IV, sodium chloride flush, sodium chloride flush   Assessment/Plan  1. Acute on chronic systolic CHF: NICM, normal cors in 2014, likely related to HTN vs. Noncompliance. EF 20%, PA pressure 80.  - NYHA II - Volume status stable.  - No Entresto with history of angioedema.  - Continue Spiro 25mg  daily (will hold today and restart tomorrow as creatinine is 2).  - Continue isosorbide 60mg  daily.  - Continue hydralazine 25mg  TID.  - Continue Coreg 12.5mg  BID.  - Start torsemide at home tomorrow 40mg  daily, with 75mEq of Kcl.  2. Acute on CKD stage III: Baseline creatinine 1.3-1.6.  -Creatinine improving. 2.03 today.  3. Bi V ICD  4. HTN - Well controlled on current regimen.  5. History of atrial tachycardia - Continue Amio 200mg  daily.   - No atrial tach noted on tele - No changes to current plan.   Home today. HF meds for home to start tomorrow: Arlyce Harman 25mg  daily, isosorbide 60mg  daily, hydralazine 25mg  TID, Coreg 12.5 mg BID, torsemide 40mg  daily, Kcl 66mEq daily, Amio 200mg  daily.   She is enrolled in the GALACTIC HF study.   Length of Stay: Sneads, NP  02/15/2017, 8:13 AM  Advanced Heart Failure Team Pager [phone] (M-F; 7a - 4p)  Please contact [facility] for night-coverage after hours (4p -7a ) and weekends on [email or web address]   Patient seen and examined with Jettie Booze, NP. We discussed all aspects of the encounter. I agree with the assessment and plan as stated above.   Volume status look good. Renal function improving. RHC results reviewed with her. Ok to d/c today. Will f/u in HF clinic next week.  Glori Bickers, MD  3:45 PM

## 2017-02-15 NOTE — Progress Notes (Signed)
EKG done and complete.

## 2017-02-15 NOTE — Research (Signed)
RESEARCH ENCOUNTER  Patient ID: Michelle Andrade  DOB: April 08, 1961  [name] enrolled in the the [facility] Heart Failure Research Study.  No signs/symptoms of ACS. IP dispensed.  Vitals, labs and study EKG obtained.  Copy of consent given to patient.    Blood pressure 125/64, pulse 72, temperature 98.1 F (36.7 C), resp. rate 16, height 5\' 3"  (1.6 m), weight 176 lb 8 oz (80.1 kg), SpO2 99 %.  Patient will follow up with Research Clinic in 2 weeks.

## 2017-02-15 NOTE — Research (Signed)
Galactic Heart FailureInformed Consent  Subject Name: Michelle Andrade  Subject met inclusion and exclusion criteria. The informed consent form, study requirements and expectations were reviewed with the subject and questions and concerns were addressed prior to the signing of the consent form. The subject verbalized understanding of the trail requirements. The subject agreed to participate in the [place] and signed the informed consent. The informed consent was obtained prior to performance of any protocol-specific procedures for the subject. A copy of the signed informed consent was given to the subject and a copy was placed in the subject's medical record.  Duncan Dull, RN 02/15/2017

## 2017-02-15 NOTE — Discharge Summary (Signed)
Physician Discharge Summary  Michelle Andrade VCB:[id number] DOB: 01-26-1961 DOA: 02/09/2017  PCP: Henderson Baltimore, [name]  Admit date: 02/09/2017 Discharge date: 02/15/2017  Admitted From: Home Disposition: Home  Recommendations for Outpatient Follow-up:  1. Follow up with PCP in 1 week 2. Follow up with cardiology on 02/21/17 3. Started on glipizide for diabetes mellitus   Discharge Condition: Stable CODE STATUS: Full code Diet recommendation: Heart healthy   Brief/Interim Summary:  Admission HPI written by Elwin Mocha, MD   Chief Complaint:sob  HPI: Michelle Andrade is a 56 y.o. female  history significant for CHF, diabetes, gout, hypertension, heart attack, sleep apnea who presents with shortness breath. Patient states that she is slowly begun more short of breath over the last week. No medication changes. Has adhered to taking her medications. Is unsure what her dry weight is. Patient states she is very anxious and she thinks is making her shortness of breath worse. Also thinks that her daily bloatedness related to her shortness breath. Denies any chest pain, nausea, vomiting and diaphoresis.  ED Course: trop 0.04, elevated bnp, no CP, given lasix and asa, CXR show vascular congestion Patient has been diuresing but is still very dyspneic on exam per the [facility] course:  Acute systolic heart failure New EF of 20% on echo obtained this admission. Heart failure team managed with aggressive IV diuresis. Right heart cath was performed on 6/13 and significant for well compensated filling pressures and mildly reduced cardiac output. Patient with good weight loss but became over-diuresed as evident by worsened creatinine. Diretics adjusted with subsequent improvement of creatinine and patient discharge for close outpatient follow-up. Patient discharged on spironolactone, isosorbide mononitrate, hydralazine and coreg -Heart failure team recommendations: pending for today but  anticipate cutting back on diuresis secondary to worsening kidney function -Continue spironolactone 25 mg daily -Continue isosorbide nitrate 60 mg daily -Continue hydralazine 25 mg 3 times a Andrade -Continue Coreg 25 mg twice a Andrade  CKD stage III Baseline creatinine of 1.4-1.6. Worsening likely secondary to overdiuresis. -diuresis per heart failure team  History of atrial tachycardia She is status post ICD placement prior to admission. Continued amiodarone.  Hypokalemia Resolved and stable.  Hyperlipidemia Stable. -Continue simvastatin  Diabetes mellitus with hyperglycemia On metformin as an outpatient. Fasting blood sugar above goal. Managed with sliding scale insulin. Metformin discontinued on discharge secondary to CKD. Started on glipizde 5mg  daily. Will need continued augmentation as an outpatient.  Chronic pain syndrome Continued mobic  Essential hypertension Controlled and stable.  Obstructive sleep apnea CPAP qhs.  GERD Continued PPI  Restless leg syndrome Continued Requip  Mood disorder Continued Zoloft and Ativan  Anemia Unknown etiology. Normocytic. No evidence of bleeding. Iron panel unremarkable.  Discharge Diagnoses:  Principal Problem:   Acute on chronic systolic (congestive) heart failure (HCC) Active Problems:   CHF (congestive heart failure) (HCC)   Pulmonary vascular congestion   Hypertension   Sleep apnea   Constipated   CKD (chronic kidney disease), stage III   Type 2 diabetes mellitus with stage 3 chronic kidney disease (HCC)   AKI (acute kidney injury) [facility])    Discharge Instructions  Discharge Instructions    Call MD for:  difficulty breathing, headache or visual disturbances    Complete by:  As directed    Call MD for:  persistant dizziness or light-headedness    Complete by:  As directed    Call MD for:  persistant nausea and vomiting    Complete by:  As directed    Diet - low sodium heart healthy    Complete by:   As directed    Increase activity slowly    Complete by:  As directed      Allergies as of 02/15/2017      Reactions   Codeine Itching   Gabapentin Itching   Haldol [haloperidol Lactate] Other (See Comments)   Dizziness, hallucinations   Lisinopril Itching, Swelling   Losartan Itching   Morphine And Related Nausea And Vomiting   Penicillins Nausea And Vomiting   "Vomiting, upset stomach, Headache" Has patient had a PCN reaction causing immediate rash, facial/tongue/throat swelling, SOB or lightheadedness with hypotension: No Has patient had a PCN reaction causing severe rash involving mucus membranes or skin necrosis: No Has patient had a PCN reaction that required hospitalization: No Has patient had a PCN reaction occurring within the last 10 years: No If all of the above answers are "NO", then may proceed with Cephalosporin use.      Medication List    STOP taking these medications   clindamycin 150 MG capsule Commonly known as:  CLEOCIN   metFORMIN 500 MG tablet Commonly known as:  GLUCOPHAGE     TAKE these medications   amiodarone 200 MG tablet Commonly known as:  PACERONE Take 200 mg by mouth daily.   aspirin 81 MG chewable tablet Chew 81 mg by mouth daily.   CALCIUM 600/VITAMIN D 600-400 MG-UNIT Tabs Generic drug:  Calcium Carbonate-Vitamin D3 Take 1 tablet by mouth 2 (two) times daily.   carvedilol 12.5 MG tablet Commonly known as:  COREG Take 1 tablet (12.5 mg total) by mouth 2 (two) times daily with a meal. What changed:  medication strength  how much to take   cyclobenzaprine 5 MG tablet Commonly known as:  FLEXERIL Take 5 mg by mouth 3 (three) times daily as needed for muscle spasms.   ferrous sulfate 325 (65 FE) MG tablet Take 325 mg by mouth 3 (three) times daily.   glipiZIDE 5 MG tablet Commonly known as:  GLUCOTROL Take 1 tablet (5 mg total) by mouth daily before breakfast.   hydrALAZINE 25 MG tablet Commonly known as:  APRESOLINE Take  1 tablet (25 mg total) by mouth every 8 (eight) hours. What changed:  how much to take  when to take this   isosorbide mononitrate 60 MG 24 hr tablet Commonly known as:  IMDUR Take 1 tablet (60 mg total) by mouth daily. Start taking on:  02/16/2017   loratadine 10 MG tablet Commonly known as:  CLARITIN Take 10 mg by mouth daily.   magnesium oxide 400 MG tablet Commonly known as:  MAG-OX Take 400 mg by mouth 2 (two) times daily.   meloxicam 7.5 MG tablet Commonly known as:  MOBIC Take 7.5 mg by mouth daily.   multivitamin tablet Take 1 tablet by mouth daily.   omeprazole 20 MG capsule Commonly known as:  PRILOSEC Take 20 mg by mouth 2 (two) times daily. What changed:  Another medication with the same name was removed. Continue taking this medication, and follow the directions you see here.   potassium chloride SA 20 MEQ tablet Commonly known as:  K-DUR,KLOR-CON Take 1 tablet (20 mEq total) by mouth daily. Start taking on:  02/16/2017 What changed:  medication strength  how much to take  when to take this   rOPINIRole 0.5 MG tablet Commonly known as:  REQUIP Take 0.5 mg by mouth at bedtime.   sertraline 50  MG tablet Commonly known as:  ZOLOFT Take 150 mg by mouth daily.   simvastatin 20 MG tablet Commonly known as:  ZOCOR Take 20 mg by mouth daily.   spironolactone 25 MG tablet Commonly known as:  ALDACTONE Take 25 mg by mouth daily.   torsemide 20 MG tablet Commonly known as:  DEMADEX Take 2 tablets (40 mg total) by mouth daily. What changed:  how much to take      Follow-up Information    [facility] Follow up on 02/21/2017.   Specialty:  Cardiology Why:  at 2:30pm for follow up. Garage code [id number].  Contact information: [address] [id number] [place] [phone]       Henderson Baltimore, [name]. Schedule an appointment as soon as possible for a visit in 1 week(s).    Specialty:  Nurse Practitioner Contact information: [place] [address] [phone]          Allergies  Allergen Reactions  . Codeine Itching  . Gabapentin Itching  . Haldol [Haloperidol Lactate] Other (See Comments)    Dizziness, hallucinations  . Lisinopril Itching and Swelling  . Losartan Itching  . Morphine And Related Nausea And Vomiting  . Penicillins Nausea And Vomiting    "Vomiting, upset stomach, Headache" Has patient had a PCN reaction causing immediate rash, facial/tongue/throat swelling, SOB or lightheadedness with hypotension: No Has patient had a PCN reaction causing severe rash involving mucus membranes or skin necrosis: No Has patient had a PCN reaction that required hospitalization: No Has patient had a PCN reaction occurring within the last 10 years: No If all of the above answers are "NO", then may proceed with Cephalosporin use.     Consultations:  Cardiology - Heart failure   Procedures/Studies: Dg Chest 2 View  Result Date: 02/10/2017 CLINICAL DATA:  Shortness of breath. EXAM: CHEST  2 VIEW COMPARISON:  02/09/2017 and prior exams FINDINGS: Cardiomegaly and left-sided pacemaker/ ICD again noted. Pulmonary vascular congestion and minimal interstitial edema again noted. There is no evidence of pleural effusion or pneumothorax. No acute bony abnormalities are present. There has been little interval change since prior study. IMPRESSION: Unchanged appearance of the chest with cardiomegaly and minimal interstitial pulmonary edema. Electronically Signed   By: Margarette Canada M.D.   On: 02/10/2017 07:19   Dg Chest 2 View  Result Date: 02/09/2017 CLINICAL DATA:  Shortness of Breath EXAM: CHEST  2 VIEW COMPARISON:  Chest radiograph November 15, 2016 and chest CT November 15, 2016 FINDINGS: The areas of patchy airspace opacity noted previously have resolve. There is currently no airspace consolidation or appreciable edema. There  is cardiomegaly with pulmonary venous hypertension. Pacemaker leads are attached to the right atrium, right ventricle, and coronary sinus. There are total shoulder replacements bilaterally. No bone lesions. No adenopathy. IMPRESSION: Pulmonary vascular congestion without frank edema or consolidation. Electronically Signed   By: Lowella Grip III M.D.   On: 02/09/2017 12:06   Dg Abd 1 View  Result Date: 02/09/2017 CLINICAL DATA:  Abdominal pain/distension, constipation EXAM: ABDOMEN - 1 VIEW COMPARISON:  None. FINDINGS: Nonobstructive bowel gas pattern. Mild degenerative changes of the lumbar spine. IMPRESSION: Unremarkable abdominal radiograph. Electronically Signed   By: Julian Hy M.D.   On: 02/09/2017 21:24   Dg Chest Port 1 View  Result Date: 02/13/2017 CLINICAL DATA:  Acute shortness of breath EXAM: PORTABLE CHEST 1 VIEW COMPARISON:  02/10/2017 and prior radiographs FINDINGS: Cardiomegaly and left AICD/pacemaker again noted. Mild pulmonary vascular congestion is present. There is no evidence of focal airspace disease, pulmonary edema, suspicious pulmonary nodule/mass, pleural effusion, or pneumothorax. No acute bony abnormalities are identified. Bilateral shoulder arthroplasty changes again noted. IMPRESSION: Cardiomegaly and mild pulmonary vascular congestion. Electronically Signed   By: Margarette Canada M.D.   On: 02/13/2017 17:14     Echocardiogram (02/10/17)  Study Conclusions  - Left ventricle: The cavity size was moderately dilated. Wall   thickness was normal. The estimated ejection fraction was 20%.   Diffuse hypokinesis. Doppler parameters are consistent with both   elevated ventricular end-diastolic filling pressure and elevated   left atrial filling pressure. - Aortic valve: There was trivial regurgitation. - Mitral valve: There was moderate to severe regurgitation. - Left atrium: The atrium was moderately dilated. - Atrial septum: A patent foramen ovale cannot be  excluded. - Tricuspid valve: There was moderate regurgitation. - Pulmonary arteries: PA peak pressure: 80 mm Hg (S). - Pericardium, extracardiac: Small pericardial effusion no   tamponade.  Right heart catheterization (02/13/17)  Findings:  RA = 5 RV = 49/4 PA = 55/18 (35) PCW = 18 Fick cardiac output/index = 4.5/2.5 Thermo CO/CI = 4.5/2.5 PVR = 3.8 WU FA sat = 95% PA sat = 54%, 54%  Assessment: 1. Well-compensated filling pressures 2. Mildly reduced cardiac output   Subjective: Patient reports no dyspnea or chest pain. No leg swelling.  Discharge Exam: Vitals:   02/15/17 0835 02/15/17 0840  BP: (!) 116/57 125/64  Pulse:  72  Resp:  16  Temp:     Vitals:   02/15/17 0325 02/15/17 0555 02/15/17 0835 02/15/17 0840  BP:  118/66 (!) 116/57 125/64  Pulse:  69  72  Resp:  20  16  Temp:  98.1 F (36.7 C)    TempSrc:  Oral    SpO2:  97%  99%  Weight: 80.1 kg (176 lb 8 oz)     Height:        General: Pt is alert, awake, not in acute distress Cardiovascular: RRR, S1/S2 +, no rubs, no gallops Respiratory: CTA bilaterally, no wheezing, no rhonchi Abdominal: Soft, NT, ND, bowel sounds + Extremities: no edema, no cyanosis    The results of significant diagnostics from this hospitalization (including imaging, microbiology, ancillary and laboratory) are listed below for reference.     Labs: BNP (last 3 results)  Recent Labs  11/15/16 1348 02/09/17 1223  BNP 451.0* 932.3*   Basic Metabolic Panel:  Recent Labs Lab 02/09/17 1652  02/11/17 0236 02/12/17 0547 02/13/17 0801 02/13/17 1939 02/14/17 0155 02/15/17 0311  NA  --   < > 137 141 138  --  138 135  K  --   < > 4.1 4.9 4.1  --  4.4 3.4*  CL  --   < > 103 110 104  --  101 100*  CO2  --   < > 24 20* 22  --  26 24  GLUCOSE  --   < > 159* 139* 163*  --  152* 136*  BUN  --   < > 35* 35* 39*  --  45* 52*  CREATININE 1.38*  < > 1.99* 1.68* 1.72* 1.69* 2.35* 2.03*  CALCIUM  --   < > 9.0 8.9 9.3  --  9.0  8.9  MG 1.8  --   --   --   --   --   --   --   < > =  values in this interval not displayed. Liver Function Tests:  Recent Labs Lab 02/09/17 1222 02/11/17 0236 02/12/17 0547  AST 24 21 24   ALT 16 16 17   ALKPHOS 67 68 63  BILITOT 0.7 0.5 0.3  PROT 7.3 6.6 6.9  ALBUMIN 3.7 3.4* 3.6    Recent Labs Lab 02/09/17 1222  LIPASE 18   CBC:  Recent Labs Lab 02/09/17 1222 02/09/17 1652 02/10/17 0522 02/12/17 0547 02/13/17 1939  WBC 5.1 6.6 6.8 4.4 4.6  NEUTROABS 3.6  --  4.1  --   --   HGB 10.6* 10.8* 10.5* 9.6* 11.1*  HCT 31.8* 33.6* 32.9* 30.6* 35.2*  MCV 93.3 93.1 92.9 93.6 92.6  PLT 289 315 287 276 289   Cardiac Enzymes:  Recent Labs Lab 02/09/17 1222 02/09/17 1652 02/09/17 2129 02/10/17 0522  TROPONINI 0.04* 0.04* 0.04* 0.05*   CBG:  Recent Labs Lab 02/14/17 1614 02/14/17 2111 02/15/17 0750 02/15/17 1206 02/15/17 1610  GLUCAP 127* 137* 246* 88 119*   Anemia work up  Recent Labs  02/13/17 1319  VITAMINB12 455  FOLATE 32.5  FERRITIN 126  TIBC 364  IRON 68  RETICCTPCT 2.5    Time coordinating discharge: Over 30 minutes  SIGNED:   Cordelia Poche, MD [facility] 02/15/2017, 4:11 PM Pager [phone]  If 7PM-7AM, please contact night-coverage [email or web address] Password [email or web address]

## 2017-02-18 ENCOUNTER — Other Ambulatory Visit: Payer: Self-pay | Admitting: *Deleted

## 2017-02-18 MED ORDER — STUDY - INVESTIGATIONAL MEDICATION: 1.0000 | Freq: Two times a day (BID) | 99 refills | Status: DC

## 2017-02-19 ENCOUNTER — Encounter: Payer: Self-pay | Admitting: *Deleted

## 2017-02-21 ENCOUNTER — Ambulatory Visit (HOSPITAL_COMMUNITY)
Admit: 2017-02-21 | Discharge: 2017-02-21 | Disposition: A | Discharge status: Home / Self Care | Payer: Medicare Other | Source: Ambulatory Visit | Attending: Internal Medicine | Admitting: Internal Medicine

## 2017-02-21 ENCOUNTER — Telehealth (HOSPITAL_COMMUNITY): Payer: Self-pay

## 2017-02-21 VITALS — BP 168/88 | HR 84 | Wt 182.4 lb

## 2017-02-21 DIAGNOSIS — Z8679 Personal history of other diseases of the circulatory system: Secondary | ICD-10-CM

## 2017-02-21 DIAGNOSIS — N183 Chronic kidney disease, stage 3 unspecified: Secondary | ICD-10-CM

## 2017-02-21 DIAGNOSIS — E1122 Type 2 diabetes mellitus with diabetic chronic kidney disease: Secondary | ICD-10-CM | POA: Insufficient documentation

## 2017-02-21 DIAGNOSIS — Z7982 Long term (current) use of aspirin: Secondary | ICD-10-CM | POA: Diagnosis not present

## 2017-02-21 DIAGNOSIS — I5023 Acute on chronic systolic (congestive) heart failure: Secondary | ICD-10-CM | POA: Diagnosis not present

## 2017-02-21 DIAGNOSIS — E785 Hyperlipidemia, unspecified: Secondary | ICD-10-CM | POA: Diagnosis not present

## 2017-02-21 DIAGNOSIS — Z79899 Other long term (current) drug therapy: Secondary | ICD-10-CM | POA: Insufficient documentation

## 2017-02-21 DIAGNOSIS — Z8249 Family history of ischemic heart disease and other diseases of the circulatory system: Secondary | ICD-10-CM | POA: Diagnosis not present

## 2017-02-21 DIAGNOSIS — F329 Major depressive disorder, single episode, unspecified: Secondary | ICD-10-CM | POA: Diagnosis not present

## 2017-02-21 DIAGNOSIS — K589 Irritable bowel syndrome without diarrhea: Secondary | ICD-10-CM | POA: Diagnosis not present

## 2017-02-21 DIAGNOSIS — M109 Gout, unspecified: Secondary | ICD-10-CM | POA: Diagnosis not present

## 2017-02-21 DIAGNOSIS — Z888 Allergy status to other drugs, medicaments and biological substances status: Secondary | ICD-10-CM | POA: Diagnosis not present

## 2017-02-21 DIAGNOSIS — G4733 Obstructive sleep apnea (adult) (pediatric): Secondary | ICD-10-CM | POA: Insufficient documentation

## 2017-02-21 DIAGNOSIS — I252 Old myocardial infarction: Secondary | ICD-10-CM | POA: Diagnosis not present

## 2017-02-21 DIAGNOSIS — Z880 Allergy status to penicillin: Secondary | ICD-10-CM | POA: Insufficient documentation

## 2017-02-21 DIAGNOSIS — Z7984 Long term (current) use of oral hypoglycemic drugs: Secondary | ICD-10-CM | POA: Insufficient documentation

## 2017-02-21 DIAGNOSIS — Z95810 Presence of automatic (implantable) cardiac defibrillator: Secondary | ICD-10-CM | POA: Diagnosis not present

## 2017-02-21 DIAGNOSIS — I130 Hypertensive heart and chronic kidney disease with heart failure and stage 1 through stage 4 chronic kidney disease, or unspecified chronic kidney disease: Secondary | ICD-10-CM | POA: Insufficient documentation

## 2017-02-21 DIAGNOSIS — I5022 Chronic systolic (congestive) heart failure: Secondary | ICD-10-CM

## 2017-02-21 DIAGNOSIS — Z885 Allergy status to narcotic agent status: Secondary | ICD-10-CM | POA: Diagnosis not present

## 2017-02-21 DIAGNOSIS — I428 Other cardiomyopathies: Secondary | ICD-10-CM | POA: Diagnosis not present

## 2017-02-21 DIAGNOSIS — M25569 Pain in unspecified knee: Secondary | ICD-10-CM | POA: Diagnosis not present

## 2017-02-21 DIAGNOSIS — I2510 Atherosclerotic heart disease of native coronary artery without angina pectoris: Secondary | ICD-10-CM | POA: Insufficient documentation

## 2017-02-21 DIAGNOSIS — K219 Gastro-esophageal reflux disease without esophagitis: Secondary | ICD-10-CM | POA: Diagnosis not present

## 2017-02-21 DIAGNOSIS — I471 Supraventricular tachycardia: Secondary | ICD-10-CM | POA: Diagnosis not present

## 2017-02-21 DIAGNOSIS — I10 Essential (primary) hypertension: Secondary | ICD-10-CM | POA: Diagnosis not present

## 2017-02-21 LAB — BASIC METABOLIC PANEL
Anion gap: 8 (ref 5–15)
BUN: 22 mg/dL — AB (ref 6–20)
CALCIUM: 9.2 mg/dL (ref 8.9–10.3)
CO2: 26 mmol/L (ref 22–32)
CREATININE: 1.51 mg/dL — AB (ref 0.44–1.00)
Chloride: 107 mmol/L (ref 101–111)
GFR calc Af Amer: 43 mL/min — ABNORMAL LOW (ref >60)
GFR calc non Af Amer: 37 mL/min — ABNORMAL LOW (ref >60)
GLUCOSE: 95 mg/dL (ref 65–99)
Potassium: 4.1 mmol/L (ref 3.5–5.1)
Sodium: 141 mmol/L (ref 135–145)

## 2017-02-21 MED ORDER — TORSEMIDE 20 MG PO TABS: 40.0000 mg | ORAL_TABLET | Freq: Two times a day (BID) | ORAL | 3 refills | Status: DC

## 2017-02-21 MED ORDER — HYDRALAZINE HCL 50 MG PO TABS: 50.0000 mg | ORAL_TABLET | Freq: Three times a day (TID) | ORAL | 3 refills | Status: DC

## 2017-02-21 NOTE — Telephone Encounter (Signed)
CHF Clinic appointment reminder call placed to patient for upcoming post-hospital follow up.  Does understand purpose of this appointment and where CHF Clinic is located? Patient confirms apt date/time.location and is aware of reason for apt.  How is patient feeling? Feeling well, however states she has had a 5 lb weight gain this past week. 6/16  175.5 lb 6/17  178 lb 6/18  177 lb 6/19  177 lb 6/20  179 lb Patient confirms taking 40 mg torsemide once daily, has not taken any extra to try to get weight down.  Does patient have all of their medications since their recent discharge? Yes  Patient also reminded to take all medications as prescribed on the day of his/her appointment and to bring all medications to this appointment.  Advised to call our office for tardiness or cancellations/rescheduling needs.  Leory Plowman, Guinevere Ferrari

## 2017-02-21 NOTE — Progress Notes (Signed)
Advanced Heart Failure Medication Review by a Pharmacist  Does the patient  feel that his/her medications are working for him/her?  yes  Has the patient been experiencing any side effects to the medications prescribed?  no  Does the patient measure his/her own blood pressure or blood glucose at home?  yes   Does the patient have any problems obtaining medications due to transportation or finances?   no  Understanding of regimen: good Understanding of indications: good Potential of compliance: good Patient understands to avoid NSAIDs. Patient understands to avoid decongestants.  Issues to address at subsequent visits: none   Pharmacist comments: Michelle Andrade is a pleasant 56 yo female presenting with medication bottles. Patient was recently taken off metformin. Patient endorses good adherence to medications and has not questions at this time. She is taking meloxicam for knee pain but is aware not to take additional NSAIDs.    Carlean Jews, Pharm.D. PGY1 Pharmacy Resident 6/21/20183:04 PM Pager [phone]  Time with patient: 10 mins Preparation and documentation time: 3 mins Total time: 13 mins

## 2017-02-21 NOTE — Progress Notes (Signed)
Advanced Heart Failure Clinic Note   Referring Physician: Primary Care: Primary Cardiologist:  HPI:  [name] is a 56 year old female with a past medical history of HTN, chronic systolic CHF s/p BI V ICD ([employer]),atrial tachycardia (2017), NICM (normal cors in 2014), CKD stage III, HLD, DM, OSA w/CPAP, and anemia.  She was admitted 12/26/16-12/27/16 with volume overload at [facility]. Echo that admission with EF 25-30%. She was discharged on Spiro 25mg  daily, Coreg 25mg  BID,and hydralazine 12.5mg  daily. Discharge weight was 177 pounds.   Her baseline creatinine appears to be 1.4-1.6 when reviewing her records from [facility]. Her last office visit with her Cardiologist Dr. Otho Perl was in January 2018 . He noted angioedema with ACE-I and cough with ARB.   She presented to the ED on 02/09/17 with SOB and abdominal bloating. Started on IV Lasix. RHC done and showed Fick output/index 4.5/2.5. PVR 3.8 WU. Discharge weight was 176 pounds.  She presents today for post hospital follow up. Feels well, denies chest pain and SOB. She does feel like her abdomen is bloated. Has a good amount of energy, not SOB with walking around the house or with walking around stores. Denies orthopnea and PND. She is taking all medications as prescribed, weighing herself at home. Weights at home 180-181 pounds. She has started eating better, prior to recent admit she was eating fast food for every meal and drinking well over 2L a day. She has started cooking at home and watching her fluid intake.    Past Medical History:  Diagnosis Date  . Arthritis 02/08/2017  . 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 CAD (coronary artery disease) 09/01/2013  . CHF (congestive heart failure) ([place]) 06/07/2015  . CKD (chronic kidney disease), stage III 12/09/2013  . Depression 07/27/2013  . Diabetes mellitus without complication ([place]) 16/60/6301  . GERD  (gastroesophageal reflux disease) 07/27/2013  . Gout 12/29/2013  . Heart valve disorder 06/03/2013  . High cholesterol 11/10/2013  . Hypertension 11/10/2013  . IBS (irritable bowel syndrome) 09/27/2014  . Myocardial infarct ([place])   . Sleep apnea 10/13/2013  . Vascular disorder 01/12/2014    Current Outpatient Prescriptions  Medication Sig Dispense Refill  . amiodarone (PACERONE) 200 MG tablet Take 200 mg by mouth daily.    [name] aspirin 81 MG chewable tablet Chew 81 mg by mouth daily.    . Calcium Carbonate-Vitamin D3 (CALCIUM 600/VITAMIN D) 600-400 MG-UNIT TABS Take 1 tablet by mouth 2 (two) times daily.    . carvedilol (COREG) 12.5 MG tablet Take 1 tablet (12.5 mg total) by mouth 2 (two) times daily with a meal. 60 tablet 0  . cyclobenzaprine (FLEXERIL) 5 MG tablet Take 5 mg by mouth 3 (three) times daily as needed for muscle spasms.    . ferrous sulfate 325 (65 FE) MG tablet Take 325 mg by mouth 3 (three) times daily.     [name] glipiZIDE (GLUCOTROL) 5 MG tablet Take 1 tablet (5 mg total) by mouth daily before breakfast. 30 tablet 0  . hydrALAZINE (APRESOLINE) 25 MG tablet Take 1 tablet (25 mg total) by mouth every 8 (eight) hours. 90 tablet 0  . Investigational - Study Medication Take 1 tablet by mouth 2 (two) times daily. Study name: Galactic Heart Failure Study Additional study details: Omecamtiv Mecarbil or Placebo 1 each PRN  . isosorbide mononitrate (IMDUR) 60 MG 24 hr tablet Take 1 tablet (60 mg total) by mouth daily. 30 tablet 0  .  loratadine (CLARITIN) 10 MG tablet Take 10 mg by mouth daily.    . magnesium oxide (MAG-OX) 400 MG tablet Take 400 mg by mouth 2 (two) times daily.    . meloxicam (MOBIC) 7.5 MG tablet Take 7.5 mg by mouth daily.    . Multiple Vitamin (MULTIVITAMIN) tablet Take 1 tablet by mouth daily.    [name] omeprazole (PRILOSEC) 20 MG capsule Take 20 mg by mouth 2 (two) times daily.     . potassium chloride (K-DUR,KLOR-CON) 20 MEQ tablet Take 1 tablet (20 mEq total) by mouth  daily. 30 tablet 0  . rOPINIRole (REQUIP) 0.5 MG tablet Take 0.5 mg by mouth at bedtime.     . sertraline (ZOLOFT) 50 MG tablet Take 150 mg by mouth daily.     . simvastatin (ZOCOR) 20 MG tablet Take 20 mg by mouth daily.    [name] spironolactone (ALDACTONE) 25 MG tablet Take 25 mg by mouth daily.    [name] torsemide (DEMADEX) 20 MG tablet Take 2 tablets (40 mg total) by mouth daily. 60 tablet 0   No current facility-administered medications for this encounter.     Allergies  Allergen Reactions  . Codeine Itching  . Gabapentin Itching  . Haldol [Haloperidol Lactate] Other (See Comments)    Dizziness, hallucinations  . Lisinopril Itching and Swelling  . Losartan Itching  . Morphine And Related Nausea And Vomiting  . Penicillins Nausea And Vomiting    "Vomiting, upset stomach, Headache" Has patient had a PCN reaction causing immediate rash, facial/tongue/throat swelling, SOB or lightheadedness with hypotension: No Has patient had a PCN reaction causing severe rash involving mucus membranes or skin necrosis: No Has patient had a PCN reaction that required hospitalization: No Has patient had a PCN reaction occurring within the last 10 years: No If all of the above answers are "NO", then may proceed with Cephalosporin use.       Social History   Social History  . Marital status: Single    Spouse name: N/A  . Number of children: N/A  . Years of education: N/A   Occupational History  . Not on file.   Social History Main Topics  . Smoking status: Never Smoker  . Smokeless tobacco: Never Used  . Alcohol use No  . Drug use: No  . Sexual activity: Not on file   Other Topics Concern  . Not on file   Social History Narrative  . No narrative on file      Family History  Problem Relation Age of Onset  . Hypertension Mother     Vitals:   02/21/17 1453  BP: (!) 168/88  Pulse: 84  SpO2: 99%  Weight: 182 lb 6.4 oz (82.7 kg)     PHYSICAL EXAM: General:  Well appearing female.  NAD. Walked into clinic without difficulty.  HEENT: normal Neck: supple. 8-9 cm JVD. Carotids 2+ bilat; no bruits. No lymphadenopathy or thyromegaly appreciated. Cor: PMI nondisplaced. Regular rate & rhythm. No rubs, gallops or murmurs. Lungs: clear bilaterally. Normal effort.  Abdomen: soft, nontender, + distended. No hepatosplenomegaly. No bruits or masses. Good bowel sounds. Extremities: no cyanosis, clubbing, rash, No peripheral edema.  Neuro: alert & oriented x 3, cranial nerves grossly intact. moves all 4 extremities w/o difficulty. Affect pleasant.    ASSESSMENT & PLAN: 1. Acute on chronic systolic CHF: NICM, normal cors in 2014, likely related to HTN vs. Noncompliance vs. Tachy - medicated with history of atrial tach. : Echo 02/2017 EF 20%, moderate MR,  moderate TR. PA pressure 80 mm Hg.   - NYHA II - Volume status elevated today.  - Increase torsemide to 40mg  BID. BMET in 2 weeks.  - No Entresto with history of angioedema. - Continue Spiro 25mg  daily.  - Continue isosorbide 60mg  daily.  - Increase hydralazine to 50mg  TID.  - Continue Coreg 12.5mg  BID - paramedicine will contact her this week.  - Encouraged her to continue daily weights.  2. CKD stage III: Baseline creatinine 1.3-1.6.  - BMET in 2 weeks after increased torsemide.  3. Bi V ICD  4. HTN - Hypertensive today, increase meds as above.  5. History of atrial tachycardia - On Amiodarone 200 mg daily - Will need to check LFT's next visit.   Continue paramedicine. BMET in 2 weeks. Follow up in one month.         Increase hydralazine increse torsesmide EGD ok BMET today.   Arbutus Leas, NP 02/21/17

## 2017-02-21 NOTE — Patient Instructions (Signed)
INCREASE Torsemide to 40 mg (2 Tablets) Twice Daily  INCREASE Hydralazine to 50 mg (1 Tablet) Three Times Daily  Follow up in 1 Month

## 2017-02-22 ENCOUNTER — Other Ambulatory Visit (HOSPITAL_COMMUNITY): Payer: Self-pay

## 2017-02-22 DIAGNOSIS — I5022 Chronic systolic (congestive) heart failure: Secondary | ICD-10-CM

## 2017-03-01 ENCOUNTER — Ambulatory Visit (HOSPITAL_COMMUNITY)
Admission: RE | Admit: 2017-03-01 | Discharge: 2017-03-01 | Disposition: A | Discharge status: Home / Self Care | Payer: Medicare Other | Source: Ambulatory Visit | Attending: Cardiology | Admitting: Cardiology

## 2017-03-01 ENCOUNTER — Other Ambulatory Visit (HOSPITAL_COMMUNITY): Payer: Self-pay

## 2017-03-01 ENCOUNTER — Encounter: Payer: Self-pay | Admitting: *Deleted

## 2017-03-01 DIAGNOSIS — I5022 Chronic systolic (congestive) heart failure: Secondary | ICD-10-CM | POA: Insufficient documentation

## 2017-03-01 DIAGNOSIS — Z006 Encounter for examination for normal comparison and control in clinical research program: Secondary | ICD-10-CM

## 2017-03-01 LAB — BASIC METABOLIC PANEL
Anion gap: 9 (ref 5–15)
BUN: 29 mg/dL — AB (ref 6–20)
CALCIUM: 9 mg/dL (ref 8.9–10.3)
CHLORIDE: 104 mmol/L (ref 101–111)
CO2: 25 mmol/L (ref 22–32)
CREATININE: 2.06 mg/dL — AB (ref 0.44–1.00)
GFR calc Af Amer: 29 mL/min — ABNORMAL LOW (ref >60)
GFR calc non Af Amer: 25 mL/min — ABNORMAL LOW (ref >60)
Glucose, Bld: 161 mg/dL — ABNORMAL HIGH (ref 65–99)
Potassium: 4 mmol/L (ref 3.5–5.1)
SODIUM: 138 mmol/L (ref 135–145)

## 2017-03-01 NOTE — Progress Notes (Signed)
RESEARCH ENCOUNTER  Patient ID: Michelle Andrade  DOB: 1961/07/18  [name] presented to the [facility] for Week 2 visit of the [facility].  No signs/symptoms of ACS since the last visit. Participant compliant with IP.  Patient provided scale from Heart Failure Clinic.    Blood pressure 140/79, pulse 62, resp. rate 14, weight 178 lb 9.6 oz (81 kg), SpO2 100 %.   [facility].

## 2017-03-01 NOTE — Progress Notes (Signed)
Paramedicine Encounter    Patient ID: Michelle Andrade, female    DOB: 05/26/1961, 56 y.o.   MRN: [id number]   Patient Care Team: Henderson Baltimore, [name] as PCP - General (Nurse Practitioner)  Patient Active Problem List   Diagnosis Date Noted  . AKI (acute kidney injury) ([place])   . Type 2 diabetes mellitus with stage 3 chronic kidney disease ([place]) 02/13/2017  . Acute on chronic systolic (congestive) heart failure ([place])   . CKD (chronic kidney disease), stage III   . Constipated   . CHF (congestive heart failure) ([place]) 02/09/2017  . Pulmonary vascular congestion 02/09/2017  . Hypertension   . Sleep apnea   . PAD (peripheral artery disease) ([place]) 01/12/2014    Current Outpatient Prescriptions:  .  amiodarone (PACERONE) 200 MG tablet, Take 200 mg by mouth daily., Disp: , Rfl:  .  aspirin 81 MG chewable tablet, Chew 81 mg by mouth daily., Disp: , Rfl:  .  Calcium Carbonate-Vitamin D3 (CALCIUM 600/VITAMIN D) 600-400 MG-UNIT TABS, Take 1 tablet by mouth 2 (two) times daily., Disp: , Rfl:  .  carvedilol (COREG) 12.5 MG tablet, Take 1 tablet (12.5 mg total) by mouth 2 (two) times daily with a meal., Disp: 60 tablet, Rfl: 0 .  cyclobenzaprine (FLEXERIL) 5 MG tablet, Take 5 mg by mouth 3 (three) times daily as needed for muscle spasms., Disp: , Rfl:  .  ferrous sulfate 325 (65 FE) MG tablet, Take 325 mg by mouth 3 (three) times daily. , Disp: , Rfl:  .  glipiZIDE (GLUCOTROL) 5 MG tablet, Take 1 tablet (5 mg total) by mouth daily before breakfast., Disp: 30 tablet, Rfl: 0 .  hydrALAZINE (APRESOLINE) 50 MG tablet, Take 1 tablet (50 mg total) by mouth 3 (three) times daily., Disp: 90 tablet, Rfl: 3 .  Investigational - Study Medication, Take 1 tablet by mouth 2 (two) times daily. Study name: Galactic Heart Failure Study Additional study details: Omecamtiv Mecarbil or Placebo, Disp: 1 each, Rfl: PRN .  isosorbide mononitrate (IMDUR) 60 MG 24 hr tablet, Take 1 tablet (60 mg total) by mouth daily.,  Disp: 30 tablet, Rfl: 0 .  loratadine (CLARITIN) 10 MG tablet, Take 10 mg by mouth daily., Disp: , Rfl:  .  magnesium oxide (MAG-OX) 400 MG tablet, Take 400 mg by mouth 2 (two) times daily., Disp: , Rfl:  .  meloxicam (MOBIC) 7.5 MG tablet, Take 7.5 mg by mouth daily., Disp: , Rfl:  .  Multiple Vitamin (MULTIVITAMIN) tablet, Take 1 tablet by mouth daily., Disp: , Rfl:  .  omeprazole (PRILOSEC) 20 MG capsule, Take 20 mg by mouth 2 (two) times daily. , Disp: , Rfl:  .  potassium chloride (K-DUR,KLOR-CON) 20 MEQ tablet, Take 1 tablet (20 mEq total) by mouth daily., Disp: 30 tablet, Rfl: 0 .  rOPINIRole (REQUIP) 0.5 MG tablet, Take 0.5 mg by mouth at bedtime. , Disp: , Rfl:  .  sertraline (ZOLOFT) 50 MG tablet, Take 150 mg by mouth daily. , Disp: , Rfl:  .  simvastatin (ZOCOR) 20 MG tablet, Take 20 mg by mouth daily., Disp: , Rfl:  .  spironolactone (ALDACTONE) 25 MG tablet, Take 25 mg by mouth daily., Disp: , Rfl:  .  torsemide (DEMADEX) 20 MG tablet, Take 2 tablets (40 mg total) by mouth 2 (two) times daily., Disp: 120 tablet, Rfl: 3 Allergies  Allergen Reactions  . Codeine Itching  . Gabapentin Itching  . Haldol [Haloperidol Lactate] Other (See Comments)  Dizziness, hallucinations  . Lisinopril Itching and Swelling  . Losartan Itching  . Morphine And Related Nausea And Vomiting  . Penicillins Nausea And Vomiting    "Vomiting, upset stomach, Headache" Has patient had a PCN reaction causing immediate rash, facial/tongue/throat swelling, SOB or lightheadedness with hypotension: No Has patient had a PCN reaction causing severe rash involving mucus membranes or skin necrosis: No Has patient had a PCN reaction that required hospitalization: No Has patient had a PCN reaction occurring within the last 10 years: No If all of the above answers are "NO", then may proceed with Cephalosporin use.      Social History   Social History  . Marital status: Single    Spouse name: N/A  . Number of  children: N/A  . Years of education: N/A   Occupational History  . Not on file.   Social History Main Topics  . Smoking status: Never Smoker  . Smokeless tobacco: Never Used  . Alcohol use No  . Drug use: No  . Sexual activity: Not on file   Other Topics Concern  . Not on file   Social History Narrative  . No narrative on file    Physical Exam  Constitutional: She is oriented to person, place, and time. She appears well-developed.  Neck: Normal range of motion.  Cardiovascular: Normal rate and regular rhythm.   Pulmonary/Chest: Effort normal and breath sounds normal. No respiratory distress. She has no wheezes. She has no rales.  Abdominal: Soft. She exhibits no distension. There is no tenderness.  Musculoskeletal: Normal range of motion. She exhibits no edema.  Neurological: She is alert and oriented to person, place, and time.  Skin: Skin is warm and dry.  Psychiatric: She has a normal mood and affect.        SAFE - 03/01/17 1700      Situation   Admitting diagnosis CHF   Heart failure history Exisiting   Comorbidities CKD/renal insufficiency;DM;HTN;Sleep Apnea   Readmitted within 30 days Yes   Hospital admission within past 12 months Yes   number of hospital admissions 3   number of ED visits 3     Assessment   Lives alone No   Primary support person Sister   Mode of transportation personal car   Other services involved None   Home equipement Cane;Home O2;Scale     Weight   Weighs self daily Yes   Scale provided Yes   Records on weight chart Yes     Resources   Has "Living better w/heart failure" book Yes   Has HF Zone tool Yes   Able to identify yellow zone signs/when to call MD Yes   Records zone daily Yes     Medications   Uses a pill box No   Who stocks the pill box Paramedicine   Pill box checked this visit No   Pill box refilled this visit No   Difficulty obtaining medications No   Mail order medications Yes   New medications at home  today No   Missed one or more doses of medications per week No     Nutrition   Patient receives meals on wheels No   Patient follows low sodium diet Yes   Has foods at home that meet the current recommended diet Yes   Patient follows low sugar/card diet Yes   Nutritional concerns/issues No     Activity Level   ADL's/Mobility Independent   How many feet can patient ambulate >100'  Urine   Difficulty urinating No   Changes in urine None         [employer] Living Environment - 03/01/17 1700      Outside of House   Sidewalk and pathway to house is level and free from any hazards Yes   Driveway is free from debris/snow/ice Yes   Outside stairs are stable and have sturdy handrail Yes   Porch lights are working and provide adequate lighting Yes     Living Room   Furniture is of adequate height and offers arm rests that assist in getting up and down Yes   Floor is free from any clutter that would create tripping hazards Yes   All cords are either behind furniture or secured in a manner that does not cause trip hazards Yes   All rugs are secured to floor with double-sided tape Yes   Lighting is adequate to light room Yes   All lighting has an easily accessible on/off switch Yes   Phone is readily accessible near favorite seating areas Yes   Emergency numbers are printed near all phones in house Yes     Kitchen   Items used most often are within easy reach on low shelves Yes   Step stool is present, is sturdy and has a handrail Yes   Floor mats are non-slip tread and secured to floor No   Oven controls are within easy reach Yes   Kitchen lighting is adequate and easy to reach switches Yes   ABC fire extinguisher is located in kitchen Yes     [place] is properly secured to stairs and/or all wood is properly secured N/A   Handrail is present and sturdy N/A   Stairs are free from any clutter N/A   Stairway is adequately lit N/A     Bathroom   Tub and  shower have a non-slip surface Yes   Tub and/or shower have a grab bar for stability Yes   Toilet has a raised seat No   Grab bar is attached near toilet for assistance No   Pathway from bedroom to bathroom is free from clutter and well lit for ease of movement in the middle of the night Yes     Bedroom   Floor is free from clutter Yes   Light is near bed and is easy to turn on Yes   Phone is next to bed and within easy reach Yes   Flashlight is near bed in case of emergency Yes     General   Smoke detectors in all areas of the house (each floor) and tested Yes   CO detectors on each floor of house and tested Yes   Flashlights are handy throughout the home Yes   Resident has all medical information readily available and in an area emergency providers will easily find Yes   All heaters are away from any type of flammable material Yes     Overall Tips   Homeowner ha good non-skid shoes to move around house Yes   All assisted walking devices are readily accessible and in good condition Yes   There is a phone near the floor for ease of reach in case of a fall YES   All O2 tubing is less than 50 ft. and is not a trip hazard N/A   Resident has had an annual hearing and vision check by a physician Yes   Resident has the proper hearing and visual aids  prescribed and are in good working order Yes   All medications are properly stored and labeled to avoid confusion on dosage, time to take, and avoidance of missed doses Yes       Future Appointments Date Time Provider [place]  03/14/2017 10:00 AM [place] [id number] None  04/03/2017 2:00 PM [facility] PA/NP [facility] None   BP 114/68 (BP Location: Right Arm, Patient Position: Sitting, Cuff Size: Normal)   Pulse 68   Resp 18   Wt 177 lb 8 oz (80.5 kg)   SpO2 98%   BMI 31.44 kg/m  Weight yesterday- 177.5 lbs Last visit weight- N/A  Ms Brickell was seen at home today for her initial paramedicine visit. She  reports feeling generally well and denied SOB unless she talks a lot or is bending over for some reason. She did say she was able to walk her dog around the block without SOB as long she "takes it nice and slow." She said she had episodic dizziness when standing up quickly but said it goes away. She currently has a pillbox but it only has 2 rows and she would like a large box and for paramedicine to fill it. She stated she was not having any changes in or difficulty with urination. [facility] sp[facility]actone and she only has 2 pills until then. She stated she would go to the [place] on Monday and ask about the refill.   [name], EMT 03/01/17  ACTION: Home visit completed Next visit planned for 1 week

## 2017-03-11 ENCOUNTER — Other Ambulatory Visit (HOSPITAL_COMMUNITY): Payer: Self-pay

## 2017-03-11 NOTE — Progress Notes (Signed)
Paramedicine Encounter    Patient ID: Michelle Andrade, female    DOB: 14-Aug-1961, 55 y.o.   MRN: [id number]   Patient Care Team: Henderson Baltimore, [name] as PCP - General (Nurse Practitioner)  Patient Active Problem List   Diagnosis Date Noted  . AKI (acute kidney injury) ([place])   . Type 2 diabetes mellitus with stage 3 chronic kidney disease ([place]) 02/13/2017  . Acute on chronic systolic (congestive) heart failure ([place])   . CKD (chronic kidney disease), stage III   . Constipated   . CHF (congestive heart failure) ([place]) 02/09/2017  . Pulmonary vascular congestion 02/09/2017  . Hypertension   . Sleep apnea   . PAD (peripheral artery disease) ([place]) 01/12/2014    Current Outpatient Prescriptions:  .  amiodarone (PACERONE) 200 MG tablet, Take 200 mg by mouth daily., Disp: , Rfl:  .  aspirin 81 MG chewable tablet, Chew 81 mg by mouth daily., Disp: , Rfl:  .  Calcium Carbonate-Vitamin D3 (CALCIUM 600/VITAMIN D) 600-400 MG-UNIT TABS, Take 1 tablet by mouth 2 (two) times daily., Disp: , Rfl:  .  carvedilol (COREG) 12.5 MG tablet, Take 1 tablet (12.5 mg total) by mouth 2 (two) times daily with a meal., Disp: 60 tablet, Rfl: 0 .  ferrous sulfate 325 (65 FE) MG tablet, Take 325 mg by mouth 3 (three) times daily. , Disp: , Rfl:  .  glipiZIDE (GLUCOTROL) 5 MG tablet, Take 1 tablet (5 mg total) by mouth daily before breakfast., Disp: 30 tablet, Rfl: 0 .  hydrALAZINE (APRESOLINE) 50 MG tablet, Take 1 tablet (50 mg total) by mouth 3 (three) times daily., Disp: 90 tablet, Rfl: 3 .  Investigational - Study Medication, Take 1 tablet by mouth 2 (two) times daily. Study name: Galactic Heart Failure Study Additional study details: Omecamtiv Mecarbil or Placebo, Disp: 1 each, Rfl: PRN .  isosorbide mononitrate (IMDUR) 60 MG 24 hr tablet, Take 1 tablet (60 mg total) by mouth daily., Disp: 30 tablet, Rfl: 0 .  loratadine (CLARITIN) 10 MG tablet, Take 10 mg by mouth daily., Disp: , Rfl:  .  magnesium oxide  (MAG-OX) 400 MG tablet, Take 400 mg by mouth 2 (two) times daily., Disp: , Rfl:  .  Multiple Vitamin (MULTIVITAMIN) tablet, Take 1 tablet by mouth daily., Disp: , Rfl:  .  omeprazole (PRILOSEC) 20 MG capsule, Take 20 mg by mouth 2 (two) times daily. , Disp: , Rfl:  .  potassium chloride (K-DUR,KLOR-CON) 20 MEQ tablet, Take 1 tablet (20 mEq total) by mouth daily., Disp: 30 tablet, Rfl: 0 .  rOPINIRole (REQUIP) 0.5 MG tablet, Take 0.5 mg by mouth at bedtime. , Disp: , Rfl:  .  sertraline (ZOLOFT) 50 MG tablet, Take 150 mg by mouth daily. , Disp: , Rfl:  .  simvastatin (ZOCOR) 20 MG tablet, Take 20 mg by mouth daily., Disp: , Rfl:  .  spironolactone (ALDACTONE) 25 MG tablet, Take 25 mg by mouth daily., Disp: , Rfl:  .  torsemide (DEMADEX) 20 MG tablet, Take 2 tablets (40 mg total) by mouth 2 (two) times daily., Disp: 120 tablet, Rfl: 3 .  cyclobenzaprine (FLEXERIL) 5 MG tablet, Take 5 mg by mouth 3 (three) times daily as needed for muscle spasms., Disp: , Rfl:  .  meloxicam (MOBIC) 7.5 MG tablet, Take 7.5 mg by mouth daily., Disp: , Rfl:  Allergies  Allergen Reactions  . Codeine Itching  . Gabapentin Itching  . Haldol [Haloperidol Lactate] Other (See Comments)  Dizziness, hallucinations  . Lisinopril Itching and Swelling  . Losartan Itching  . Morphine And Related Nausea And Vomiting  . Penicillins Nausea And Vomiting    "Vomiting, upset stomach, Headache" Has patient had a PCN reaction causing immediate rash, facial/tongue/throat swelling, SOB or lightheadedness with hypotension: No Has patient had a PCN reaction causing severe rash involving mucus membranes or skin necrosis: No Has patient had a PCN reaction that required hospitalization: No Has patient had a PCN reaction occurring within the last 10 years: No If all of the above answers are "NO", then may proceed with Cephalosporin use.      Social History   Social History  . Marital status: Single    Spouse name: N/A  . Number  of children: N/A  . Years of education: N/A   Occupational History  . Not on file.   Social History Main Topics  . Smoking status: Never Smoker  . Smokeless tobacco: Never Used  . Alcohol use No  . Drug use: No  . Sexual activity: Not on file   Other Topics Concern  . Not on file   Social History Narrative  . No narrative on file    Physical Exam  Constitutional: She is oriented to person, place, and time. She appears well-developed.  Neck: Normal range of motion.  Cardiovascular: Normal rate and regular rhythm.   Pulmonary/Chest: Effort normal and breath sounds normal. No respiratory distress. She has no wheezes. She has no rales.  Abdominal: Soft. She exhibits no distension. There is no tenderness.  Musculoskeletal: Normal range of motion. She exhibits no edema.  Neurological: She is alert and oriented to person, place, and time.  Skin: Skin is warm and dry.  Psychiatric: She has a normal mood and affect.        SAFE - 03/01/17 1700      Situation   Admitting diagnosis CHF   Heart failure history Exisiting   Comorbidities CKD/renal insufficiency;DM;HTN;Sleep Apnea   Readmitted within 30 days Yes   Hospital admission within past 12 months Yes   number of hospital admissions 3   number of ED visits 3     Assessment   Lives alone No   Primary support person Sister   Mode of transportation personal car   Other services involved None   Home equipement Cane;Home O2;Scale     Weight   Weighs self daily Yes   Scale provided Yes   Records on weight chart Yes     Resources   Has "Living better w/heart failure" book Yes   Has HF Zone tool Yes   Able to identify yellow zone signs/when to call MD Yes   Records zone daily Yes     Medications   Uses a pill box No   Who stocks the pill box Paramedicine   Pill box checked this visit No   Pill box refilled this visit No   Difficulty obtaining medications No   Mail order medications Yes   New medications at home  today No   Missed one or more doses of medications per week No     Nutrition   Patient receives meals on wheels No   Patient follows low sodium diet Yes   Has foods at home that meet the current recommended diet Yes   Patient follows low sugar/card diet Yes   Nutritional concerns/issues No     Activity Level   ADL's/Mobility Independent   How many feet can patient ambulate >100'  Urine   Difficulty urinating No   Changes in urine None        Future Appointments Date Time Provider Gap  03/14/2017 10:00 AM [facility] None  04/03/2017 2:00 PM [facility] PA/NP [facility] None   BP 116/62 (BP Location: Left Arm, Patient Position: Sitting, Cuff Size: Normal)   Pulse 72   Resp 16   Wt 181 lb 12.8 oz (82.5 kg)   SpO2 96%   BMI 32.20 kg/m  Weight yesterday- did not weigh Last visit weight- 177 lbs CBG- 155 mg/dl  Michelle Andrade was seen at home today and reports feeling well. She continues to have SOB when going upstairs or when she is talking a lot. She also reports having hot flashes while at rest but denies them correlating with SOB, chest pain, dizziness or nausea. She reports being compliant with all medications and was found to be on meloxicam, which she advised she was prescribed for bilateral knee pain and swelling. I consulted Erika at the clinic about this and she advised that tylenol would be preferred. I provided Michelle Andrade with a new pillbox today which was big enough to hold all of her medications for a week and filled it up.   [name], EMT 03/11/17  ACTION: Home visit completed Next visit planned for 1 week

## 2017-03-14 ENCOUNTER — Other Ambulatory Visit: Payer: Self-pay | Admitting: *Deleted

## 2017-03-14 MED ORDER — METOLAZONE 2.5 MG PO TABS: 2.5000 mg | ORAL_TABLET | ORAL | 0 refills | Status: DC

## 2017-03-14 MED ORDER — POTASSIUM CHLORIDE ER 20 MEQ PO TBCR: 20.0000 meq | EXTENDED_RELEASE_TABLET | ORAL | 1 refills | Status: DC

## 2017-03-14 NOTE — Research (Signed)
Patient is present for Galactic HF Study visit Week 4. She states she is more active since hospital discharge 4 weeks ago. She states she does feel "a little" tight in her upper abdomen, and this is where she carries fluid. She has had a 5 lb weight gain over last 4 weeks; since hospital discharge. She returned IP and demonstrates excellent IP compliance. She denies any ACS signs or symptoms. Verbalized compliance with low sodium diet. Will return in 2 weeks for week 6 study visit. Very pleasant.

## 2017-03-15 ENCOUNTER — Encounter (HOSPITAL_BASED_OUTPATIENT_CLINIC_OR_DEPARTMENT_OTHER): Payer: Self-pay | Admitting: Emergency Medicine

## 2017-03-15 ENCOUNTER — Emergency Department (HOSPITAL_BASED_OUTPATIENT_CLINIC_OR_DEPARTMENT_OTHER)
Admission: EM | Admit: 2017-03-15 | Discharge: 2017-03-15 | Disposition: A | Discharge status: Home / Self Care | Payer: Medicare Other | Attending: Physician Assistant | Admitting: Physician Assistant

## 2017-03-15 ENCOUNTER — Emergency Department (HOSPITAL_BASED_OUTPATIENT_CLINIC_OR_DEPARTMENT_OTHER): Payer: Medicare Other

## 2017-03-15 DIAGNOSIS — R42 Dizziness and giddiness: Secondary | ICD-10-CM | POA: Insufficient documentation

## 2017-03-15 DIAGNOSIS — N183 Chronic kidney disease, stage 3 (moderate): Secondary | ICD-10-CM | POA: Insufficient documentation

## 2017-03-15 DIAGNOSIS — Z79899 Other long term (current) drug therapy: Secondary | ICD-10-CM | POA: Diagnosis not present

## 2017-03-15 DIAGNOSIS — I130 Hypertensive heart and chronic kidney disease with heart failure and stage 1 through stage 4 chronic kidney disease, or unspecified chronic kidney disease: Secondary | ICD-10-CM | POA: Insufficient documentation

## 2017-03-15 DIAGNOSIS — I252 Old myocardial infarction: Secondary | ICD-10-CM | POA: Diagnosis not present

## 2017-03-15 DIAGNOSIS — E1122 Type 2 diabetes mellitus with diabetic chronic kidney disease: Secondary | ICD-10-CM | POA: Diagnosis not present

## 2017-03-15 DIAGNOSIS — Z7982 Long term (current) use of aspirin: Secondary | ICD-10-CM | POA: Diagnosis not present

## 2017-03-15 DIAGNOSIS — Z95810 Presence of automatic (implantable) cardiac defibrillator: Secondary | ICD-10-CM | POA: Insufficient documentation

## 2017-03-15 DIAGNOSIS — Z7984 Long term (current) use of oral hypoglycemic drugs: Secondary | ICD-10-CM | POA: Insufficient documentation

## 2017-03-15 DIAGNOSIS — I5023 Acute on chronic systolic (congestive) heart failure: Secondary | ICD-10-CM | POA: Insufficient documentation

## 2017-03-15 LAB — BASIC METABOLIC PANEL
Anion gap: 10 (ref 5–15)
BUN: 41 mg/dL — AB (ref 6–20)
CO2: 20 mmol/L — ABNORMAL LOW (ref 22–32)
Calcium: 9 mg/dL (ref 8.9–10.3)
Chloride: 105 mmol/L (ref 101–111)
Creatinine, Ser: 1.66 mg/dL — ABNORMAL HIGH (ref 0.44–1.00)
GFR calc Af Amer: 38 mL/min — ABNORMAL LOW (ref >60)
GFR calc non Af Amer: 33 mL/min — ABNORMAL LOW (ref >60)
Glucose, Bld: 110 mg/dL — ABNORMAL HIGH (ref 65–99)
Potassium: 4.3 mmol/L (ref 3.5–5.1)
SODIUM: 135 mmol/L (ref 135–145)

## 2017-03-15 LAB — CBC
HEMATOCRIT: 34.4 % — AB (ref 36.0–46.0)
Hemoglobin: 11.5 g/dL — ABNORMAL LOW (ref 12.0–15.0)
MCH: 31.1 pg (ref 26.0–34.0)
MCHC: 33.4 g/dL (ref 30.0–36.0)
MCV: 93 fL (ref 78.0–100.0)
Platelets: 238 10*3/uL (ref 150–400)
RBC: 3.7 MIL/uL — ABNORMAL LOW (ref 3.87–5.11)
RDW: 16.3 % — AB (ref 11.5–15.5)
WBC: 4.4 10*3/uL (ref 4.0–10.5)

## 2017-03-15 LAB — URINALYSIS, ROUTINE W REFLEX MICROSCOPIC
Bilirubin Urine: NEGATIVE
Glucose, UA: NEGATIVE mg/dL
Hgb urine dipstick: NEGATIVE
KETONES UR: NEGATIVE mg/dL
Nitrite: NEGATIVE
PH: 5.5 (ref 5.0–8.0)
Protein, ur: NEGATIVE mg/dL
Specific Gravity, Urine: 1.016 (ref 1.005–1.030)

## 2017-03-15 LAB — CBG MONITORING, ED
Glucose-Capillary: 90 mg/dL (ref 65–99)
Glucose-Capillary: 89 mg/dL (ref 65–99)

## 2017-03-15 LAB — BRAIN NATRIURETIC PEPTIDE: B Natriuretic Peptide: 150.3 pg/mL — ABNORMAL HIGH (ref 0.0–100.0)

## 2017-03-15 LAB — URINALYSIS, MICROSCOPIC (REFLEX): RBC / HPF: NONE SEEN RBC/hpf (ref 0–5)

## 2017-03-15 LAB — TROPONIN I: Troponin I: 0.03 ng/mL (ref <0.03)

## 2017-03-15 NOTE — ED Triage Notes (Signed)
patient states that she is having "swimmy" headed since about 4 pm today - reports that she is a CHF clinic patient and called them and they told her to come here. The patient is writhing around and intermittently grabbing her epigastric region - denies any pain, reports that where "the fluid really starters"

## 2017-03-15 NOTE — ED Provider Notes (Signed)
[facility] Provider Note   CSN: [id number] Arrival date & time: 03/15/17  1850     History   Chief Complaint Chief Complaint  Patient presents with  . Dizziness    HPI Michelle Andrade is a 56 y.o. female.  HPI  Patient is a chronically ill 56 year old female with history of CAD CHF ICD, diabetes, GERD presenting today with feeling dizziness. Patient has severe CHF followed by Dr. Ronna Polio. Patient contact his office today became. Yesterday she took her extra torsemide. She reports today she feels orthostatically dizzy. She reports is worse when she stands up. She reports that this happens all the time to her baseline but is worse today. She wanted to come and make sure that her labs all are reassuring. Patient has had no chest pain or shortness of breath just a feeling of swimming headedness. She does report that her glucose was 90. She reports that she recently got switched from metformin to glipizide during last hospitalization. She's noted that the glipizide has made her sugars very low.  Past Medical History:  Diagnosis Date  . Arthritis 02/08/2017  . 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 CAD (coronary artery disease) 09/01/2013  . CHF (congestive heart failure) ([place]) 06/07/2015  . CKD (chronic kidney disease), stage III 12/09/2013  . Depression 07/27/2013  . Diabetes mellitus without complication ([place]) 16/38/4536  . GERD (gastroesophageal reflux disease) 07/27/2013  . Gout 12/29/2013  . Heart valve disorder 06/03/2013  . High cholesterol 11/10/2013  . Hypertension 11/10/2013  . IBS (irritable bowel syndrome) 09/27/2014  . Myocardial infarct ([place])   . Sleep apnea 10/13/2013  . Vascular disorder 01/12/2014    Patient Active Problem List   Diagnosis Date Noted  . AKI (acute kidney injury) ([place])   . Type 2 diabetes mellitus with stage 3 chronic kidney disease ([place]) 02/13/2017  . Acute on chronic systolic  (congestive) heart failure ([place])   . CKD (chronic kidney disease), stage III   . Constipated   . CHF (congestive heart failure) ([place]) 02/09/2017  . Pulmonary vascular congestion 02/09/2017  . Hypertension   . Sleep apnea   . PAD (peripheral artery disease) ([place]) 01/12/2014    Past Surgical History:  Procedure Laterality Date  . ABDOMINAL HYSTERECTOMY    . CARDIAC DEFIBRILLATOR PLACEMENT    . CARPAL TUNNEL RELEASE    . HEEL SPUR EXCISION    . KNEE SURGERY    . PACEMAKER GENERATOR CHANGE    . RIGHT HEART CATH N/A 02/13/2017   Procedure: Right Heart Cath;  Surgeon: Jolaine Artist, MD;  Location: [facility];  Service: Cardiovascular;  Laterality: N/A;  . SHOULDER SURGERY      OB History    No data available       Home Medications    Prior to Admission medications   Medication Sig Start Date End Date Taking? Authorizing Provider  amiodarone (PACERONE) 200 MG tablet Take 200 mg by mouth daily.    [provider]  aspirin 81 MG chewable tablet Chew 81 mg by mouth daily.    [provider]  Calcium Carbonate-Vitamin D3 (CALCIUM 600/VITAMIN D) 600-400 MG-UNIT TABS Take 1 tablet by mouth 2 (two) times daily.    [provider]  carvedilol (COREG) 12.5 MG tablet Take 1 tablet (12.5 mg total) by mouth 2 (two) times daily with a meal. 02/15/17   Mariel Aloe, MD  cyclobenzaprine (FLEXERIL) 5 MG tablet Take 5 mg by mouth  3 (three) times daily as needed for muscle spasms.    [provider]  ferrous sulfate 325 (65 FE) MG tablet Take 325 mg by mouth 3 (three) times daily.     [provider]  glipiZIDE (GLUCOTROL) 5 MG tablet Take 1 tablet (5 mg total) by mouth daily before breakfast. 02/15/17   Mariel Aloe, MD  hydrALAZINE (APRESOLINE) 50 MG tablet Take 1 tablet (50 mg total) by mouth 3 (three) times daily. 02/21/17   Arbutus Leas, NP  Investigational - Study Medication Take 1 tablet by mouth 2 (two) times daily. Study name:  Galactic Heart Failure Study Additional study details: Omecamtiv Mecarbil or Placebo 02/15/17   Larey Dresser, MD  isosorbide mononitrate (IMDUR) 60 MG 24 hr tablet Take 1 tablet (60 mg total) by mouth daily. 02/16/17   Mariel Aloe, MD  loratadine (CLARITIN) 10 MG tablet Take 10 mg by mouth daily.    [provider]  magnesium oxide (MAG-OX) 400 MG tablet Take 400 mg by mouth 2 (two) times daily.    [provider]  meloxicam (MOBIC) 7.5 MG tablet Take 7.5 mg by mouth daily.    [provider]  metolazone (ZAROXOLYN) 2.5 MG tablet Take 1 tablet (2.5 mg total) by mouth once a week. 03/14/17 06/12/17  Bensimhon, Shaune Pascal, MD  Multiple Vitamin (MULTIVITAMIN) tablet Take 1 tablet by mouth daily.    [provider]  omeprazole (PRILOSEC) 20 MG capsule Take 20 mg by mouth 2 (two) times daily.     [provider]  potassium chloride (K-DUR,KLOR-CON) 20 MEQ tablet Take 1 tablet (20 mEq total) by mouth daily. 02/16/17   Mariel Aloe, MD  potassium chloride 20 MEQ TBCR Take 20 mEq by mouth once a week. Take with Metolazone 03/14/17 06/12/17  Bensimhon, Shaune Pascal, MD  rOPINIRole (REQUIP) 0.5 MG tablet Take 0.5 mg by mouth at bedtime.     [provider]  sertraline (ZOLOFT) 50 MG tablet Take 150 mg by mouth daily.     [provider]  simvastatin (ZOCOR) 20 MG tablet Take 20 mg by mouth daily.    [provider]  spironolactone (ALDACTONE) 25 MG tablet Take 25 mg by mouth daily.    [provider]  torsemide (DEMADEX) 20 MG tablet Take 2 tablets (40 mg total) by mouth 2 (two) times daily. 02/21/17   Arbutus Leas, NP    Family History Family History  Problem Relation Age of Onset  . Hypertension Mother     Social History Social History  Substance Use Topics  . Smoking status: Never Smoker  . Smokeless tobacco: Never Used  . Alcohol use No     Allergies   Codeine; Gabapentin; Haldol [haloperidol lactate];  Lisinopril; Losartan; Morphine and related; and Penicillins   Review of Systems Review of Systems  Constitutional: Negative for activity change and fever.  Respiratory: Negative for cough and shortness of breath.   Cardiovascular: Negative for chest pain.  Gastrointestinal: Negative for abdominal pain.  Neurological: Negative for dizziness and headaches.     Physical Exam Updated Vital Signs BP 128/82   Pulse 60   Temp 99.1 F (37.3 C) (Oral)   Resp 20   Ht 5\' 3"  (1.6 m)   Wt 82.9 kg (182 lb 12.8 oz)   SpO2 98%   BMI 32.38 kg/m   Physical Exam  Constitutional: She is oriented to person, place, and time. She appears well-developed and well-nourished.  HENT:  Head: Normocephalic and atraumatic.  No nystagmus  Eyes: Right eye exhibits no discharge.  Cardiovascular: Normal rate, regular rhythm and normal heart sounds.   No murmur heard. Pulmonary/Chest: Effort normal and breath sounds normal. She has no wheezes. She has no rales.  Abdominal: Soft. She exhibits no distension. There is no tenderness.  Neurological: She is oriented to person, place, and time.  Steady ambulation  Skin: Skin is warm and dry. She is not diaphoretic.  Psychiatric: She has a normal mood and affect.  Nursing note and vitals reviewed.    ED Treatments / Results  Labs (all labs ordered are listed, but only abnormal results are displayed) Labs Reviewed  BASIC METABOLIC PANEL - Abnormal; Notable for the following:       Result Value   CO2 20 (*)    Glucose, Bld 110 (*)    BUN 41 (*)    Creatinine, Ser 1.66 (*)    GFR calc non Af Amer 33 (*)    GFR calc Af Amer 38 (*)    All other components within normal limits  CBC - Abnormal; Notable for the following:    RBC 3.70 (*)    Hemoglobin 11.5 (*)    HCT 34.4 (*)    RDW 16.3 (*)    All other components within normal limits  URINALYSIS, ROUTINE W REFLEX MICROSCOPIC - Abnormal; Notable for the following:    Leukocytes, UA MODERATE (*)    All  other components within normal limits  BRAIN NATRIURETIC PEPTIDE - Abnormal; Notable for the following:    B Natriuretic Peptide 150.3 (*)    All other components within normal limits  TROPONIN I - Abnormal; Notable for the following:    Troponin I 0.03 (*)    All other components within normal limits  URINALYSIS, MICROSCOPIC (REFLEX) - Abnormal; Notable for the following:    Bacteria, UA RARE (*)    Squamous Epithelial / LPF 0-5 (*)    All other components within normal limits  CBG MONITORING, ED  CBG MONITORING, ED    EKG  EKG Interpretation  Date/Time:  Friday March 15 2017 19:21:25 EDT Ventricular Rate:  61 PR Interval:  166 QRS Duration: 134 QT Interval:  492 QTC Calculation: 495 R Axis:   18 Text Interpretation:  Atrial-sensed ventricular-paced rhythm Abnormal ECG No significant change since last tracing Confirmed by Parc, [place] [id number]) on 03/15/2017 7:58:20 PM       Radiology Dg Chest 2 View  Result Date: 03/15/2017 CLINICAL DATA:  Shortness of breath EXAM: CHEST  2 VIEW COMPARISON:  02/13/2017 FINDINGS: Left-sided multi lead pacing device as before. Bilateral shoulder replacements. No acute infiltrate or effusion. Mild cardiomegaly. Mild central congestion. No edema or pleural effusion. IMPRESSION: Mild cardiomegaly with slight central congestion. No overt edema or pleural effusion. Electronically Signed   By: Donavan Foil M.D.   On: 03/15/2017 19:23    Procedures Procedures (including critical care time)  Medications Ordered in ED Medications - No data to display   Initial Impression / Assessment and Plan / ED Course  I have reviewed the triage vital signs and the nursing notes.  Pertinent labs & imaging results that were available during my care of the patient were reviewed by me and considered in my medical decision making (see chart for details).     Patient's well-appearing43 year old female with history of CAD CHF ICD, diabetes, GERD presenting  today with feeling dizziness. Patient was feeling lightheaded in the setting of increasing her  furosemide yesterday. No chest pain or other concerning symptoms. In addition patient has been taking glipizide which is new for her. She reports that her blood sugar was only 90 after taking 2 things of penut butter and some sugar. We recommended that she halve the glipizide. Follow-up with her primary care and Bens[facility]one on Monday. Her labs x-ray and physical exam are all reassuring. I think it likely orthostatic in response to the furosemide or was a  low blood sugar in response to her glipizide.   Final Clinical Impressions(s) / ED Diagnoses   Final diagnoses:  Lightheadedness    New Prescriptions New Prescriptions   No medications on file     Macarthur Critchley, MD 03/15/17 2205

## 2017-03-15 NOTE — Discharge Instructions (Signed)
Please continue to monitor symptoms. Please take half of the glipizide pill you're originally taking. And monitor glucose at home. Please make a follow-up appointment with Dr. Zoila Shutter early next week.

## 2017-03-15 NOTE — ED Notes (Signed)
C/o dizziness x 4 hours that wont go away  States has dizziness a lot but doesn't last long at a time

## 2017-03-18 ENCOUNTER — Other Ambulatory Visit (HOSPITAL_COMMUNITY): Payer: Self-pay

## 2017-03-18 ENCOUNTER — Telehealth: Payer: Self-pay | Admitting: *Deleted

## 2017-03-18 NOTE — Telephone Encounter (Signed)
Contacted pt via phone to discuss recent ED visit.  Pt states she has a history of being dizzy.  Pt was being proactive with going to the ED as the episode of dizziness "lasted longer than normal."  Pt unsure if dizzy episodes are related to low blood glucose or other medications as "all the medications have a side effect of dizziness."  Pt stated compliance with halving Glipizide medication, as prescribed by ED MD.  Pt stated she has not had any similar dizzy spells since she has been home.

## 2017-03-18 NOTE — Progress Notes (Signed)
Paramedicine Encounter    Patient ID: Michelle Andrade, female    DOB: [birth year], 56 y.o.   MRN: [id number]   Patient Care Team: Henderson Baltimore, [name] as PCP - General (Nurse Practitioner)  Patient Active Problem List   Diagnosis Date Noted  . AKI (acute kidney injury) ([place])   . Type 2 diabetes mellitus with stage 3 chronic kidney disease ([place]) 02/13/2017  . Acute on chronic systolic (congestive) heart failure ([place])   . CKD (chronic kidney disease), stage III   . Constipated   . CHF (congestive heart failure) ([place]) 02/09/2017  . Pulmonary vascular congestion 02/09/2017  . Hypertension   . Sleep apnea   . PAD (peripheral artery disease) ([place]) 01/12/2014    Current Outpatient Prescriptions:  .  amiodarone (PACERONE) 200 MG tablet, Take 200 mg by mouth daily., Disp: , Rfl:  .  aspirin 81 MG chewable tablet, Chew 81 mg by mouth daily., Disp: , Rfl:  .  Calcium Carbonate-Vitamin D3 (CALCIUM 600/VITAMIN D) 600-400 MG-UNIT TABS, Take 1 tablet by mouth 2 (two) times daily., Disp: , Rfl:  .  carvedilol (COREG) 12.5 MG tablet, Take 1 tablet (12.5 mg total) by mouth 2 (two) times daily with a meal., Disp: 60 tablet, Rfl: 0 .  cyclobenzaprine (FLEXERIL) 5 MG tablet, Take 5 mg by mouth 3 (three) times daily as needed for muscle spasms., Disp: , Rfl:  .  ferrous sulfate 325 (65 FE) MG tablet, Take 325 mg by mouth 3 (three) times daily. , Disp: , Rfl:  .  glipiZIDE (GLUCOTROL) 5 MG tablet, Take 1 tablet (5 mg total) by mouth daily before breakfast., Disp: 30 tablet, Rfl: 0 .  hydrALAZINE (APRESOLINE) 50 MG tablet, Take 1 tablet (50 mg total) by mouth 3 (three) times daily., Disp: 90 tablet, Rfl: 3 .  Investigational - Study Medication, Take 1 tablet by mouth 2 (two) times daily. Study name: Galactic Heart Failure Study Additional study details: Omecamtiv Mecarbil or Placebo, Disp: 1 each, Rfl: PRN .  isosorbide mononitrate (IMDUR) 60 MG 24 hr tablet, Take 1 tablet (60 mg total) by mouth daily.,  Disp: 30 tablet, Rfl: 0 .  loratadine (CLARITIN) 10 MG tablet, Take 10 mg by mouth daily., Disp: , Rfl:  .  magnesium oxide (MAG-OX) 400 MG tablet, Take 400 mg by mouth 2 (two) times daily., Disp: , Rfl:  .  meloxicam (MOBIC) 7.5 MG tablet, Take 7.5 mg by mouth daily., Disp: , Rfl:  .  metolazone (ZAROXOLYN) 2.5 MG tablet, Take 1 tablet (2.5 mg total) by mouth once a week., Disp: 12 tablet, Rfl: 0 .  Multiple Vitamin (MULTIVITAMIN) tablet, Take 1 tablet by mouth daily., Disp: , Rfl:  .  omeprazole (PRILOSEC) 20 MG capsule, Take 20 mg by mouth 2 (two) times daily. , Disp: , Rfl:  .  potassium chloride (K-DUR,KLOR-CON) 20 MEQ tablet, Take 1 tablet (20 mEq total) by mouth daily., Disp: 30 tablet, Rfl: 0 .  potassium chloride 20 MEQ TBCR, Take 20 mEq by mouth once a week. Take with Metolazone, Disp: 90 tablet, Rfl: 1 .  rOPINIRole (REQUIP) 0.5 MG tablet, Take 0.5 mg by mouth at bedtime. , Disp: , Rfl:  .  sertraline (ZOLOFT) 50 MG tablet, Take 150 mg by mouth daily. , Disp: , Rfl:  .  simvastatin (ZOCOR) 20 MG tablet, Take 20 mg by mouth daily., Disp: , Rfl:  .  spironolactone (ALDACTONE) 25 MG tablet, Take 25 mg by mouth daily., Disp: , Rfl:  .  torsemide (DEMADEX) 20 MG tablet, Take 2 tablets (40 mg total) by mouth 2 (two) times daily., Disp: 120 tablet, Rfl: 3 Allergies  Allergen Reactions  . Codeine Itching  . Gabapentin Itching  . Haldol [Haloperidol Lactate] Other (See Comments)    Dizziness, hallucinations  . Lisinopril Itching and Swelling  . Losartan Itching  . Morphine And Related Nausea And Vomiting  . Penicillins Nausea And Vomiting    "Vomiting, upset stomach, Headache" Has patient had a PCN reaction causing immediate rash, facial/tongue/throat swelling, SOB or lightheadedness with hypotension: No Has patient had a PCN reaction causing severe rash involving mucus membranes or skin necrosis: No Has patient had a PCN reaction that required hospitalization: No Has patient had a PCN  reaction occurring within the last 10 years: No If all of the above answers are "NO", then may proceed with Cephalosporin use.      Social History   Social History  . Marital status: Single    Spouse name: N/A  . Number of children: N/A  . Years of education: N/A   Occupational History  . Not on file.   Social History Main Topics  . Smoking status: Never Smoker  . Smokeless tobacco: Never Used  . Alcohol use No  . Drug use: No  . Sexual activity: Not on file   Other Topics Concern  . Not on file   Social History Narrative  . No narrative on file    Physical Exam  Constitutional: She is oriented to person, place, and time. She appears well-developed.  Neck: Normal range of motion.  Cardiovascular: Normal rate and regular rhythm.   Pulmonary/Chest: Effort normal and breath sounds normal. No respiratory distress. She has no wheezes. She has no rales.  Abdominal: Soft. She exhibits no distension.  Musculoskeletal: Normal range of motion.  Neurological: She is alert and oriented to person, place, and time.  Skin: Skin is warm and dry.        SAFE - 03/01/17 1700      Situation   Admitting diagnosis CHF   Heart failure history Exisiting   Comorbidities CKD/renal insufficiency;DM;HTN;Sleep Apnea   Readmitted within 30 days Yes   Hospital admission within past 12 months Yes   number of hospital admissions 3   number of ED visits 3     Assessment   Lives alone No   Primary support person Sister   Mode of transportation personal car   Other services involved None   Home equipement Cane;Home O2;Scale     Weight   Weighs self daily Yes   Scale provided Yes   Records on weight chart Yes     Resources   Has "Living better w/heart failure" book Yes   Has HF Zone tool Yes   Able to identify yellow zone signs/when to call MD Yes   Records zone daily Yes     Medications   Uses a pill box No   Who stocks the pill box Paramedicine   Pill box checked this visit  No   Pill box refilled this visit No   Difficulty obtaining medications No   Mail order medications Yes   New medications at home today No   Missed one or more doses of medications per week No     Nutrition   Patient receives meals on wheels No   Patient follows low sodium diet Yes   Has foods at home that meet the current recommended diet Yes   Patient follows low  sugar/card diet Yes   Nutritional concerns/issues No     Activity Level   ADL's/Mobility Independent   How many feet can patient ambulate >100'     Urine   Difficulty urinating No   Changes in urine None        Future Appointments Date Time Provider [place]  03/27/2017 10:00 AM [place] [facility] None  04/03/2017 2:00 PM [facility] PA/NP [facility] None   BP 128/70 (BP Location: Left Arm, Patient Position: Sitting, Cuff Size: Normal)   Pulse 84   Resp 18   Wt 183 lb 9.6 oz (83.3 kg)   SpO2 96%   BMI 32.52 kg/m  Weight yesterday- 182 lbs Last visit weight- 181 lbs  Ms Trinidad was seen at home today and reports feeling well. Slhe denied and SOB, H/A or dizziness today, though the dizziness has been a problem recently. She was seen in the ED for this on Friday evening and released after being told to reduce her glipizide to a half a pill. She continues to get very SOB and diaphoretic when she talks a lot but says it gets better when she stops. Medications were verified and her pillbox was filled.   [name], EMT 03/18/17  ACTION: Home visit completed Next visit planned for 1 week

## 2017-03-25 ENCOUNTER — Other Ambulatory Visit (HOSPITAL_COMMUNITY): Payer: Self-pay

## 2017-03-25 NOTE — Progress Notes (Signed)
Paramedicine Encounter    Patient ID: Michelle Andrade, female    DOB: 21-Feb-1961, 56 y.o.   MRN: [id number]   Patient Care Team: Henderson Baltimore, [name] as PCP - General (Nurse Practitioner)  Patient Active Problem List   Diagnosis Date Noted  . AKI (acute kidney injury) ([place])   . Type 2 diabetes mellitus with stage 3 chronic kidney disease ([place]) 02/13/2017  . Acute on chronic systolic (congestive) heart failure ([place])   . CKD (chronic kidney disease), stage III   . Constipated   . CHF (congestive heart failure) ([place]) 02/09/2017  . Pulmonary vascular congestion 02/09/2017  . Hypertension   . Sleep apnea   . PAD (peripheral artery disease) ([place]) 01/12/2014    Current Outpatient Prescriptions:  .  amiodarone (PACERONE) 200 MG tablet, Take 200 mg by mouth daily., Disp: , Rfl:  .  aspirin 81 MG chewable tablet, Chew 81 mg by mouth daily., Disp: , Rfl:  .  Calcium Carbonate-Vitamin D3 (CALCIUM 600/VITAMIN D) 600-400 MG-UNIT TABS, Take 1 tablet by mouth 2 (two) times daily., Disp: , Rfl:  .  carvedilol (COREG) 12.5 MG tablet, Take 1 tablet (12.5 mg total) by mouth 2 (two) times daily with a meal., Disp: 60 tablet, Rfl: 0 .  cyclobenzaprine (FLEXERIL) 5 MG tablet, Take 5 mg by mouth 3 (three) times daily as needed for muscle spasms., Disp: , Rfl:  .  ferrous sulfate 325 (65 FE) MG tablet, Take 325 mg by mouth 3 (three) times daily. , Disp: , Rfl:  .  glipiZIDE (GLUCOTROL) 5 MG tablet, Take 1 tablet (5 mg total) by mouth daily before breakfast., Disp: 30 tablet, Rfl: 0 .  hydrALAZINE (APRESOLINE) 50 MG tablet, Take 1 tablet (50 mg total) by mouth 3 (three) times daily., Disp: 90 tablet, Rfl: 3 .  Investigational - Study Medication, Take 1 tablet by mouth 2 (two) times daily. Study name: Galactic Heart Failure Study Additional study details: Omecamtiv Mecarbil or Placebo, Disp: 1 each, Rfl: PRN .  isosorbide mononitrate (IMDUR) 60 MG 24 hr tablet, Take 1 tablet (60 mg total) by mouth daily.,  Disp: 30 tablet, Rfl: 0 .  loratadine (CLARITIN) 10 MG tablet, Take 10 mg by mouth daily., Disp: , Rfl:  .  magnesium oxide (MAG-OX) 400 MG tablet, Take 400 mg by mouth 2 (two) times daily., Disp: , Rfl:  .  meloxicam (MOBIC) 7.5 MG tablet, Take 7.5 mg by mouth daily., Disp: , Rfl:  .  metolazone (ZAROXOLYN) 2.5 MG tablet, Take 1 tablet (2.5 mg total) by mouth once a week., Disp: 12 tablet, Rfl: 0 .  Multiple Vitamin (MULTIVITAMIN) tablet, Take 1 tablet by mouth daily., Disp: , Rfl:  .  omeprazole (PRILOSEC) 20 MG capsule, Take 20 mg by mouth 2 (two) times daily. , Disp: , Rfl:  .  potassium chloride (K-DUR,KLOR-CON) 20 MEQ tablet, Take 1 tablet (20 mEq total) by mouth daily., Disp: 30 tablet, Rfl: 0 .  potassium chloride 20 MEQ TBCR, Take 20 mEq by mouth once a week. Take with Metolazone, Disp: 90 tablet, Rfl: 1 .  rOPINIRole (REQUIP) 0.5 MG tablet, Take 0.5 mg by mouth at bedtime. , Disp: , Rfl:  .  sertraline (ZOLOFT) 50 MG tablet, Take 150 mg by mouth daily. , Disp: , Rfl:  .  simvastatin (ZOCOR) 20 MG tablet, Take 20 mg by mouth daily., Disp: , Rfl:  .  spironolactone (ALDACTONE) 25 MG tablet, Take 25 mg by mouth daily., Disp: , Rfl:  .  torsemide (DEMADEX) 20 MG tablet, Take 2 tablets (40 mg total) by mouth 2 (two) times daily., Disp: 120 tablet, Rfl: 3 Allergies  Allergen Reactions  . Codeine Itching  . Gabapentin Itching  . Haldol [Haloperidol Lactate] Other (See Comments)    Dizziness, hallucinations  . Lisinopril Itching and Swelling  . Losartan Itching  . Morphine And Related Nausea And Vomiting  . Penicillins Nausea And Vomiting    "Vomiting, upset stomach, Headache" Has patient had a PCN reaction causing immediate rash, facial/tongue/throat swelling, SOB or lightheadedness with hypotension: No Has patient had a PCN reaction causing severe rash involving mucus membranes or skin necrosis: No Has patient had a PCN reaction that required hospitalization: No Has patient had a PCN  reaction occurring within the last 10 years: No If all of the above answers are "NO", then may proceed with Cephalosporin use.      Social History   Social History  . Marital status: Single    Spouse name: N/A  . Number of children: N/A  . Years of education: N/A   Occupational History  . Not on file.   Social History Main Topics  . Smoking status: Never Smoker  . Smokeless tobacco: Never Used  . Alcohol use No  . Drug use: No  . Sexual activity: Not on file   Other Topics Concern  . Not on file   Social History Narrative  . No narrative on file    Physical Exam  Constitutional: She is oriented to person, place, and time. She appears well-developed.  Neck: Normal range of motion.  Cardiovascular: Normal rate and regular rhythm.   Pulmonary/Chest: Effort normal and breath sounds normal. No respiratory distress. She has no wheezes. She has no rales.  Abdominal: Soft. She exhibits no distension. There is no tenderness. There is no rebound.  Musculoskeletal: Normal range of motion. She exhibits no edema.  Neurological: She is alert and oriented to person, place, and time.  Skin: Skin is warm and dry.  Psychiatric: She has a normal mood and affect.        SAFE - 03/01/17 1700      Situation   Admitting diagnosis CHF   Heart failure history Exisiting   Comorbidities CKD/renal insufficiency;DM;HTN;Sleep Apnea   Readmitted within 30 days Yes   Hospital admission within past 12 months Yes   number of hospital admissions 3   number of ED visits 3     Assessment   Lives alone No   Primary support person Sister   Mode of transportation personal car   Other services involved None   Home equipement Cane;Home O2;Scale     Weight   Weighs self daily Yes   Scale provided Yes   Records on weight chart Yes     Resources   Has "Living better w/heart failure" book Yes   Has HF Zone tool Yes   Able to identify yellow zone signs/when to call MD Yes   Records zone  daily Yes     Medications   Uses a pill box No   Who stocks the pill box Paramedicine   Pill box checked this visit No   Pill box refilled this visit No   Difficulty obtaining medications No   Mail order medications Yes   New medications at home today No   Missed one or more doses of medications per week No     Nutrition   Patient receives meals on wheels No   Patient follows low  sodium diet Yes   Has foods at home that meet the current recommended diet Yes   Patient follows low sugar/card diet Yes   Nutritional concerns/issues No     Activity Level   ADL's/Mobility Independent   How many feet can patient ambulate >100'     Urine   Difficulty urinating No   Changes in urine None        Future Appointments Date Time Provider [place]  03/26/2017 10:00 AM [place] 2 [facility] None  04/03/2017 2:00 PM [facility] PA/NP [facility] None   BP 114/72 (BP Location: Left Arm, Patient Position: Sitting, Cuff Size: Normal)   Pulse 70   Resp 18   Wt 178 lb (80.7 kg)   SpO2 96%   BMI 31.53 kg/m  Weight yesterday- 178.2 lbs Last visit weight- 183 lbs CBG- 140 mg/dl  Michelle Andrade was seen at home today and reported feeling well. She stated that she still gets SOB when she talks a lot or laughes but is able to walk her dog to the end of the street and back without too much difficulty. She also advised she has dizzy spells but if she sits for a moment, she feels better. She was in good spirits and did not seem like the aforementioned symptoms were bothering her. Her medications were verified and her pillbox was refilled. No changes necessary at this point.  Michelle Andrade, EMT 03/25/17  ACTION: Home visit completed Next visit planned for 1 week

## 2017-03-26 ENCOUNTER — Encounter: Payer: Self-pay | Admitting: *Deleted

## 2017-03-26 DIAGNOSIS — Z006 Encounter for examination for normal comparison and control in clinical research program: Secondary | ICD-10-CM

## 2017-03-26 NOTE — Progress Notes (Signed)
RESEARCH ENCOUNTER  Patient ID: Michelle Andrade  DOB: [birth year]  [name] presented to the [facility] for Week 6 visit of the [facility].  No signs/symptoms of ACS since the last visit. Participant compliant with IP.  Patient will follow up with [facility] in 2 weeks.

## 2017-04-03 ENCOUNTER — Encounter (HOSPITAL_COMMUNITY): Payer: Medicare Other

## 2017-04-05 ENCOUNTER — Ambulatory Visit (HOSPITAL_COMMUNITY)
Admission: RE | Admit: 2017-04-05 | Discharge: 2017-04-05 | Disposition: A | Discharge status: Home / Self Care | Payer: Medicare Other | Source: Ambulatory Visit | Attending: Internal Medicine | Admitting: Internal Medicine

## 2017-04-05 ENCOUNTER — Other Ambulatory Visit (HOSPITAL_COMMUNITY): Payer: Self-pay

## 2017-04-05 ENCOUNTER — Telehealth (HOSPITAL_COMMUNITY): Payer: Self-pay | Admitting: Cardiology

## 2017-04-05 VITALS — BP 118/68 | HR 78 | Wt 182.6 lb

## 2017-04-05 DIAGNOSIS — F329 Major depressive disorder, single episode, unspecified: Secondary | ICD-10-CM | POA: Insufficient documentation

## 2017-04-05 DIAGNOSIS — Z8249 Family history of ischemic heart disease and other diseases of the circulatory system: Secondary | ICD-10-CM | POA: Insufficient documentation

## 2017-04-05 DIAGNOSIS — I471 Supraventricular tachycardia: Secondary | ICD-10-CM | POA: Diagnosis not present

## 2017-04-05 DIAGNOSIS — Z95810 Presence of automatic (implantable) cardiac defibrillator: Secondary | ICD-10-CM | POA: Diagnosis not present

## 2017-04-05 DIAGNOSIS — I5022 Chronic systolic (congestive) heart failure: Secondary | ICD-10-CM | POA: Diagnosis present

## 2017-04-05 DIAGNOSIS — Z79899 Other long term (current) drug therapy: Secondary | ICD-10-CM | POA: Diagnosis not present

## 2017-04-05 DIAGNOSIS — N183 Chronic kidney disease, stage 3 unspecified: Secondary | ICD-10-CM

## 2017-04-05 DIAGNOSIS — I2510 Atherosclerotic heart disease of native coronary artery without angina pectoris: Secondary | ICD-10-CM | POA: Diagnosis not present

## 2017-04-05 DIAGNOSIS — K317 Polyp of stomach and duodenum: Secondary | ICD-10-CM | POA: Insufficient documentation

## 2017-04-05 DIAGNOSIS — Z885 Allergy status to narcotic agent status: Secondary | ICD-10-CM | POA: Insufficient documentation

## 2017-04-05 DIAGNOSIS — E1122 Type 2 diabetes mellitus with diabetic chronic kidney disease: Secondary | ICD-10-CM | POA: Insufficient documentation

## 2017-04-05 DIAGNOSIS — M1990 Unspecified osteoarthritis, unspecified site: Secondary | ICD-10-CM | POA: Insufficient documentation

## 2017-04-05 DIAGNOSIS — I10 Essential (primary) hypertension: Secondary | ICD-10-CM

## 2017-04-05 DIAGNOSIS — Z881 Allergy status to other antibiotic agents status: Secondary | ICD-10-CM | POA: Insufficient documentation

## 2017-04-05 DIAGNOSIS — G4730 Sleep apnea, unspecified: Secondary | ICD-10-CM | POA: Diagnosis not present

## 2017-04-05 DIAGNOSIS — Z7982 Long term (current) use of aspirin: Secondary | ICD-10-CM | POA: Diagnosis not present

## 2017-04-05 DIAGNOSIS — Z888 Allergy status to other drugs, medicaments and biological substances status: Secondary | ICD-10-CM | POA: Insufficient documentation

## 2017-04-05 DIAGNOSIS — E7800 Pure hypercholesterolemia, unspecified: Secondary | ICD-10-CM | POA: Diagnosis not present

## 2017-04-05 DIAGNOSIS — Z7984 Long term (current) use of oral hypoglycemic drugs: Secondary | ICD-10-CM | POA: Insufficient documentation

## 2017-04-05 DIAGNOSIS — I130 Hypertensive heart and chronic kidney disease with heart failure and stage 1 through stage 4 chronic kidney disease, or unspecified chronic kidney disease: Secondary | ICD-10-CM | POA: Diagnosis not present

## 2017-04-05 DIAGNOSIS — K219 Gastro-esophageal reflux disease without esophagitis: Secondary | ICD-10-CM | POA: Insufficient documentation

## 2017-04-05 DIAGNOSIS — I428 Other cardiomyopathies: Secondary | ICD-10-CM | POA: Diagnosis not present

## 2017-04-05 DIAGNOSIS — M109 Gout, unspecified: Secondary | ICD-10-CM | POA: Insufficient documentation

## 2017-04-05 DIAGNOSIS — Z880 Allergy status to penicillin: Secondary | ICD-10-CM | POA: Insufficient documentation

## 2017-04-05 DIAGNOSIS — Z8679 Personal history of other diseases of the circulatory system: Secondary | ICD-10-CM

## 2017-04-05 DIAGNOSIS — I509 Heart failure, unspecified: Secondary | ICD-10-CM

## 2017-04-05 DIAGNOSIS — K589 Irritable bowel syndrome without diarrhea: Secondary | ICD-10-CM | POA: Insufficient documentation

## 2017-04-05 DIAGNOSIS — I252 Old myocardial infarction: Secondary | ICD-10-CM | POA: Insufficient documentation

## 2017-04-05 LAB — BASIC METABOLIC PANEL
ANION GAP: 11 (ref 5–15)
BUN: 49 mg/dL — ABNORMAL HIGH (ref 6–20)
CO2: 31 mmol/L (ref 22–32)
Calcium: 9.7 mg/dL (ref 8.9–10.3)
Chloride: 96 mmol/L — ABNORMAL LOW (ref 101–111)
Creatinine, Ser: 2.52 mg/dL — ABNORMAL HIGH (ref 0.44–1.00)
GFR calc Af Amer: 23 mL/min — ABNORMAL LOW (ref >60)
GFR, EST NON AFRICAN AMERICAN: 20 mL/min — AB (ref >60)
Glucose, Bld: 174 mg/dL — ABNORMAL HIGH (ref 65–99)
POTASSIUM: 3.7 mmol/L (ref 3.5–5.1)
SODIUM: 138 mmol/L (ref 135–145)

## 2017-04-05 LAB — HEPATIC FUNCTION PANEL
ALBUMIN: 4 g/dL (ref 3.5–5.0)
ALT: 71 U/L — ABNORMAL HIGH (ref 14–54)
AST: 62 U/L — AB (ref 15–41)
Alkaline Phosphatase: 89 U/L (ref 38–126)
Bilirubin, Direct: <0.1 mg/dL — ABNORMAL LOW (ref 0.1–0.5)
TOTAL PROTEIN: 7.8 g/dL (ref 6.5–8.1)
Total Bilirubin: 0.5 mg/dL (ref 0.3–1.2)

## 2017-04-05 MED ORDER — HYDRALAZINE HCL 25 MG PO TABS: 25.0000 mg | ORAL_TABLET | Freq: Three times a day (TID) | ORAL | 3 refills | Status: DC

## 2017-04-05 MED ORDER — TORSEMIDE 20 MG PO TABS: 40.0000 mg | ORAL_TABLET | Freq: Two times a day (BID) | ORAL | 3 refills | Status: DC

## 2017-04-05 MED ORDER — METOLAZONE 2.5 MG PO TABS: 2.5000 mg | ORAL_TABLET | ORAL | 0 refills | Status: DC

## 2017-04-05 MED ORDER — SPIRONOLACTONE 25 MG PO TABS: 25.0000 mg | ORAL_TABLET | Freq: Every day | ORAL | 3 refills | Status: DC

## 2017-04-05 NOTE — Patient Instructions (Addendum)
DECREASE Hydralazine to 25 mg, one tab three times per day CHANGE Spironolactone to 25 mg one tab daily at bedtime   Your physician recommends that you schedule a follow-up appointment in: 2 months   Labs today We will only contact you if something comes back abnormal or we need to make some changes. Otherwise no news is good news!   Do the following things EVERYDAY: 1) Weigh yourself in the morning before breakfast. Write it down and keep it in a log. 2) Take your medicines as prescribed 3) Eat low salt foods-Limit salt (sodium) to 2000 mg per day.  4) Stay as active as you can everyday 5) Limit all fluids for the day to less than 2 liters

## 2017-04-05 NOTE — Progress Notes (Signed)
Paramedicine Encounter   Patient ID: Michelle Andrade , female,   DOB: Jan 16, 1961,56 y.o.,  MRN: [id number]   Met patient in clinic today with provider Erin.   Pt c/o dizziness in the afternoons, therefore Erin reduced hydralzine to 87m and I placed spirolactone and simvastatin in the night time slots of her pill box.  Pt stated that the dizziness usually goes away after she steadies herself.  Pt has taken all of her medications without difficulty and she has pre-filled the slots with metalozone and the rx from the study.  She denies sob, headache and chest pain. Pt appears to be in very good spirits.. rx bottles verified and pill box refilled.   Time spent with patient 50 mins ** decrease hydralozine 222mTake spiro in pm  **next refill omeprazole  Dontavius Keim, EMT-Paramedic 04/05/2017   ACTION: Home visit completed

## 2017-04-05 NOTE — Progress Notes (Signed)
Advanced Heart Failure Medication Review by a Pharmacist  Does the patient  feel that his/her medications are working for him/her?  yes  Has the patient been experiencing any side effects to the medications prescribed?  no  Does the patient measure his/her own blood pressure or blood glucose at home?  yes   Does the patient have any problems obtaining medications due to transportation or finances?   no  Understanding of regimen: good Understanding of indications: good Potential of compliance: good Patient understands to avoid NSAIDs. Patient understands to avoid decongestants.  Issues to address at subsequent visits: None   Pharmacist comments: Michelle Andrade is a pleasant 56 yo F presenting with her medication bottles and [name] (paramedicine). She reports great compliance with her regimen and did not have any specific medication-related questions or concerns for me at this time.   [name]. [name], PharmD, BCPS, CPP Clinical Pharmacist Pager: [phone] Phone: [phone] 04/05/2017 10:15 AM      Time with patient: 14 minutes Preparation and documentation time: 2 minutes Total time: 16 minutes

## 2017-04-05 NOTE — Telephone Encounter (Signed)
PATIENT AWARE OF LABS RESULTS RECHECK LABS 8/8

## 2017-04-05 NOTE — Progress Notes (Signed)
Advanced Heart Failure Clinic Note   Referring Physician: Primary Care: Primary Cardiologist:  HPI:  Ms. Hairston is a 56 year old female with a past medical history of HTN, chronic systolic CHF s/p BI V ICD ([employer]),atrial tachycardia (2017), NICM (normal cors in 2014), CKD stage III, HLD, DM, OSA w/CPAP, and anemia.  She was admitted 12/26/16-12/27/16 with volume overload at [facility]. Echo that admission with EF 25-30%. She was discharged on Spiro 25mg  daily, Coreg 25mg  BID,and hydralazine 12.5mg  daily. Discharge weight was 177 pounds.   Her baseline creatinine appears to be 1.4-1.6 when reviewing her records from [facility]. Her last office visit with her Cardiologist Dr. Otho Perl was in January 2018 . He noted angioedema with ACE-I and cough with ARB.   She presented to the ED on 02/09/17 with SOB and abdominal bloating. Started on IV Lasix. RHC done and showed Fick output/index 4.5/2.5. PVR 3.8 WU. Discharge weight was 176 pounds.  Went to the ED on 03/15/17 with dizziness, orthostasis. She had taken an extra Lasix that day.   She presents today for HF follow up. Has been followed by paramedicine. Weight up 4 pounds from last visit, but she presents her weight chart and weights are stable at 178-181 pounds overall.  She denies SOB, can walk the dog without SOB. Gets SOB with stairs at time. She is dizzy at times when going from sitting to standing. Denies chest pain, no orthopnea, no PND. She has really improved her diet, no longer eating fast food and frozen foods like she was. Drinking less than 2L a day most days.     Past Medical History:  Diagnosis Date  . Arthritis 02/08/2017  . 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 CAD (coronary artery disease) 09/01/2013  . CHF (congestive heart failure) ([place]) 06/07/2015  . CKD (chronic kidney disease), stage III 12/09/2013  . Depression 07/27/2013  . Diabetes mellitus  without complication ([place]) 46/80/3212  . GERD (gastroesophageal reflux disease) 07/27/2013  . Gout 12/29/2013  . Heart valve disorder 06/03/2013  . High cholesterol 11/10/2013  . Hypertension 11/10/2013  . IBS (irritable bowel syndrome) 09/27/2014  . Myocardial infarct ([place])   . Sleep apnea 10/13/2013  . Vascular disorder 01/12/2014    Current Outpatient Prescriptions  Medication Sig Dispense Refill  . amiodarone (PACERONE) 200 MG tablet Take 200 mg by mouth daily.    [name] aspirin 81 MG chewable tablet Chew 81 mg by mouth daily.    . Calcium Carbonate-Vitamin D3 (CALCIUM 600/VITAMIN D) 600-400 MG-UNIT TABS Take 1 tablet by mouth 2 (two) times daily.    . carvedilol (COREG) 12.5 MG tablet Take 1 tablet (12.5 mg total) by mouth 2 (two) times daily with a meal. 60 tablet 0  . cyclobenzaprine (FLEXERIL) 5 MG tablet Take 5 mg by mouth 3 (three) times daily as needed for muscle spasms.    . ferrous sulfate 325 (65 FE) MG tablet Take 325 mg by mouth 3 (three) times daily.     [name] glipiZIDE (GLUCOTROL) 5 MG tablet Take 1 tablet (5 mg total) by mouth daily before breakfast. 30 tablet 0  . hydrALAZINE (APRESOLINE) 50 MG tablet Take 1 tablet (50 mg total) by mouth 3 (three) times daily. 90 tablet 3  . Investigational - Study Medication Take 1 tablet by mouth 2 (two) times daily. Study name: Galactic Heart Failure Study Additional study details: Omecamtiv Mecarbil or Placebo 1 each PRN  . isosorbide mononitrate (IMDUR) 60 MG  24 hr tablet Take 1 tablet (60 mg total) by mouth daily. 30 tablet 0  . loratadine (CLARITIN) 10 MG tablet Take 10 mg by mouth daily.    . magnesium oxide (MAG-OX) 400 MG tablet Take 400 mg by mouth 2 (two) times daily.    . meloxicam (MOBIC) 7.5 MG tablet Take 7.5 mg by mouth daily.    . metolazone (ZAROXOLYN) 2.5 MG tablet Take 1 tablet (2.5 mg total) by mouth once a week. 12 tablet 0  . Multiple Vitamin (MULTIVITAMIN) tablet Take 1 tablet by mouth daily.    [name] omeprazole  (PRILOSEC) 20 MG capsule Take 20 mg by mouth 2 (two) times daily.     . potassium chloride (K-DUR,KLOR-CON) 20 MEQ tablet Take 1 tablet (20 mEq total) by mouth daily. 30 tablet 0  . potassium chloride 20 MEQ TBCR Take 20 mEq by mouth once a week. Take with Metolazone 90 tablet 1  . rOPINIRole (REQUIP) 0.5 MG tablet Take 0.5 mg by mouth at bedtime.     . simvastatin (ZOCOR) 20 MG tablet Take 20 mg by mouth daily.    [name] spironolactone (ALDACTONE) 25 MG tablet Take 25 mg by mouth daily.    [name] torsemide (DEMADEX) 20 MG tablet Take 2 tablets (40 mg total) by mouth 2 (two) times daily. 120 tablet 3   No current facility-administered medications for this encounter.     Allergies  Allergen Reactions  . Codeine Itching  . Gabapentin Itching  . Haldol [Haloperidol Lactate] Other (See Comments)    Dizziness, hallucinations  . Levofloxacin     Other reaction(s): Other (See Comments) Projectile vomiting   . Lisinopril Itching and Swelling  . Losartan Itching  . Morphine And Related Nausea And Vomiting  . Penicillins Nausea And Vomiting    "Vomiting, upset stomach, Headache" Has patient had a PCN reaction causing immediate rash, facial/tongue/throat swelling, SOB or lightheadedness with hypotension: No Has patient had a PCN reaction causing severe rash involving mucus membranes or skin necrosis: No Has patient had a PCN reaction that required hospitalization: No Has patient had a PCN reaction occurring within the last 10 years: No If all of the above answers are "NO", then may proceed with Cephalosporin use.       Social History   Social History  . Marital status: Single    Spouse name: N/A  . Number of children: N/A  . Years of education: N/A   Occupational History  . Not on file.   Social History Main Topics  . Smoking status: Never Smoker  . Smokeless tobacco: Never Used  . Alcohol use No  . Drug use: No  . Sexual activity: Not on file   Other Topics Concern  . Not on file    Social History Narrative  . No narrative on file      Family History  Problem Relation Age of Onset  . Hypertension Mother     Vitals:   04/05/17 1003  BP: 118/68  Pulse: 78  SpO2: 96%  Weight: 182 lb 9.6 oz (82.8 kg)   Filed Weights   04/05/17 1003  Weight: 182 lb 9.6 oz (82.8 kg)     PHYSICAL EXAM: General: Well appearing. No resp difficulty. HEENT: Normal Neck: Supple. JVP 5-6. Carotids 2+ bilat; no bruits. No thyromegaly or nodule noted. Cor: PMI nondisplaced. RRR, No M/G/R noted Lungs: CTAB, normal effort. Abdomen: Soft, non-tender, non-distended, no HSM. No bruits or masses. +BS  Extremities: No cyanosis, clubbing, rash, R  and LLE no edema.  Neuro: Alert & orientedx3, cranial nerves grossly intact. moves all 4 extremities w/o difficulty. Affect pleasant    ASSESSMENT & PLAN: 1. Chronic systolic CHF: NICM, normal cors in 2014, likely related to HTN vs. Noncompliance vs. Tachy - medicated with history of atrial tach. : Echo 02/2017 EF 20%, moderate MR, moderate TR. PA pressure 80 mm Hg.   - NYHA II - Volume status stale on exam. Continue torsemide 40 mg BID.  - Continue metolazone 2.5 mg weekly.  - Continue Spiro 25 mg daily, but change to hs dosing as she has been dizzy.  - Continue isosorbide 60 mg daily - Decrease hydralazine to 25 mg TID - Continue Coreg 12.5 mg BID  2. CKD stage III: Baseline creatinine 1.3-1.6.  - BMET today.   3. Bi V ICD   4. HTN - Well controlled. Reducing hydralazine as above.   5. History of atrial tachycardia - On Amiodarone 200 mg daily - Check LFT's today.   6. Gastric polyp  - had EGD on 04/02/17 found to have gastric polyp that was biopsied.  - She will follow with the [facility]. Follow up in 2 months.     Arbutus Leas, NP 04/05/17

## 2017-04-10 ENCOUNTER — Encounter: Payer: Self-pay | Admitting: *Deleted

## 2017-04-10 ENCOUNTER — Ambulatory Visit (HOSPITAL_COMMUNITY)
Admission: RE | Admit: 2017-04-10 | Discharge: 2017-04-10 | Disposition: A | Discharge status: Home / Self Care | Payer: Medicare Other | Source: Ambulatory Visit | Attending: Internal Medicine | Admitting: Internal Medicine

## 2017-04-10 DIAGNOSIS — I509 Heart failure, unspecified: Secondary | ICD-10-CM | POA: Insufficient documentation

## 2017-04-10 DIAGNOSIS — Z006 Encounter for examination for normal comparison and control in clinical research program: Secondary | ICD-10-CM

## 2017-04-10 LAB — BASIC METABOLIC PANEL
ANION GAP: 10 (ref 5–15)
BUN: 48 mg/dL — ABNORMAL HIGH (ref 6–20)
CALCIUM: 9.5 mg/dL (ref 8.9–10.3)
CO2: 28 mmol/L (ref 22–32)
Chloride: 101 mmol/L (ref 101–111)
Creatinine, Ser: 2.35 mg/dL — ABNORMAL HIGH (ref 0.44–1.00)
GFR calc non Af Amer: 22 mL/min — ABNORMAL LOW (ref >60)
GFR, EST AFRICAN AMERICAN: 25 mL/min — AB (ref >60)
GLUCOSE: 146 mg/dL — AB (ref 65–99)
POTASSIUM: 3.7 mmol/L (ref 3.5–5.1)
Sodium: 139 mmol/L (ref 135–145)

## 2017-04-10 LAB — HEPATIC FUNCTION PANEL
ALK PHOS: 84 U/L (ref 38–126)
ALT: 58 U/L — ABNORMAL HIGH (ref 14–54)
AST: 51 U/L — ABNORMAL HIGH (ref 15–41)
Albumin: 3.8 g/dL (ref 3.5–5.0)
BILIRUBIN TOTAL: 0.3 mg/dL (ref 0.3–1.2)
Bilirubin, Direct: <0.1 mg/dL — ABNORMAL LOW (ref 0.1–0.5)
Total Protein: 7.3 g/dL (ref 6.5–8.1)

## 2017-04-10 NOTE — Progress Notes (Signed)
RESEARCH ENCOUNTER  Patient ID: Michelle Andrade  DOB: [birth year]  [name] presented to the [facility] for Week 8 visit of the [facility].  No signs/symptoms of ACS since the last visit. Subject states compliance with IP, IP returned, and additional IP dispensed.  Subject stated that she had and EGD on July 31st and a gastric polyp was biopsied but she does not know the results.  She will continue to follow with the [facility] and will update [facility] if needed.    Blood pressure 122/64, pulse 61, resp. rate 14, weight 183 lb 3.2 oz (83.1 kg), SpO2 100 %.  Patient will follow up with [facility] in 4 weeks.

## 2017-04-11 ENCOUNTER — Telehealth (HOSPITAL_COMMUNITY): Payer: Self-pay | Admitting: Cardiology

## 2017-04-11 MED ORDER — TORSEMIDE 20 MG PO TABS: 20.0000 mg | ORAL_TABLET | Freq: Two times a day (BID) | ORAL | 3 refills | Status: DC

## 2017-04-11 NOTE — Telephone Encounter (Signed)
Patient aware and voiced understanding

## 2017-04-11 NOTE — Telephone Encounter (Signed)
-----   Message from Arbutus Leas, NP sent at 04/11/2017 12:12 PM EDT ----- Please have her reduce her torsemide to 40 mg daily. If her weight increases by > 3 pounds overnight or 5 pounds in one week can take extra torsemide if needed. Will recheck labs at next visit.

## 2017-04-12 ENCOUNTER — Other Ambulatory Visit (HOSPITAL_COMMUNITY): Payer: Self-pay

## 2017-04-12 ENCOUNTER — Telehealth (HOSPITAL_COMMUNITY): Payer: Self-pay

## 2017-04-12 ENCOUNTER — Telehealth (HOSPITAL_COMMUNITY): Payer: Self-pay | Admitting: *Deleted

## 2017-04-12 NOTE — Telephone Encounter (Signed)
Zack came by clinic to let us know pt gained 3.8 lb overnight, advised per chart pt can take extra Torsemide for 3 lb wt gain overnight, she should take that today.  He will advise pt and continue to follow

## 2017-04-12 NOTE — Telephone Encounter (Signed)
Michelle Andrade was notified via phone that she needed to take one additional torsemide tonight only, per clinic staff. She understood and was agreeable.

## 2017-04-12 NOTE — Progress Notes (Signed)
chf Paramedicine Encounter    Patient ID: Michelle Andrade, female    DOB: 10-22-1960, 56 y.o.   MRN: [id number]   Patient Care Team: Henderson Baltimore, [name] as PCP - General (Nurse Practitioner)  Patient Active Problem List   Diagnosis Date Noted  . AKI (acute kidney injury) ([place])   . Type 2 diabetes mellitus with stage 3 chronic kidney disease ([place]) 02/13/2017  . Acute on chronic systolic (congestive) heart failure ([place])   . CKD (chronic kidney disease), stage III   . Constipated   . CHF (congestive heart failure) ([place]) 02/09/2017  . Pulmonary vascular congestion 02/09/2017  . Hypertension   . Sleep apnea   . PAD (peripheral artery disease) ([place]) 01/12/2014    Current Outpatient Prescriptions:  .  amiodarone (PACERONE) 200 MG tablet, Take 200 mg by mouth daily., Disp: , Rfl:  .  aspirin 81 MG chewable tablet, Chew 81 mg by mouth daily., Disp: , Rfl:  .  Calcium Carbonate-Vitamin D3 (CALCIUM 600/VITAMIN D) 600-400 MG-UNIT TABS, Take 1 tablet by mouth 2 (two) times daily., Disp: , Rfl:  .  carvedilol (COREG) 12.5 MG tablet, Take 1 tablet (12.5 mg total) by mouth 2 (two) times daily with a meal., Disp: 60 tablet, Rfl: 0 .  ferrous sulfate 325 (65 FE) MG tablet, Take 325 mg by mouth 3 (three) times daily. , Disp: , Rfl:  .  glipiZIDE (GLUCOTROL) 5 MG tablet, Take 2.5 mg by mouth daily before breakfast., Disp: , Rfl:  .  hydrALAZINE (APRESOLINE) 25 MG tablet, Take 1 tablet (25 mg total) by mouth 3 (three) times daily., Disp: 90 tablet, Rfl: 3 .  Investigational - Study Medication, Take 1 tablet by mouth 2 (two) times daily. Study name: Galactic Heart Failure Study Additional study details: Omecamtiv Mecarbil or Placebo, Disp: 1 each, Rfl: PRN .  isosorbide mononitrate (IMDUR) 60 MG 24 hr tablet, Take 1 tablet (60 mg total) by mouth daily., Disp: 30 tablet, Rfl: 0 .  loratadine (CLARITIN) 10 MG tablet, Take 10 mg by mouth daily., Disp: , Rfl:  .  magnesium oxide (MAG-OX) 400 MG tablet,  Take 400 mg by mouth 2 (two) times daily., Disp: , Rfl:  .  metolazone (ZAROXOLYN) 2.5 MG tablet, Take 1 tablet (2.5 mg total) by mouth once a week. Monday, Disp: 12 tablet, Rfl: 0 .  Multiple Vitamin (MULTIVITAMIN) tablet, Take 1 tablet by mouth daily., Disp: , Rfl:  .  omeprazole (PRILOSEC) 20 MG capsule, Take 20 mg by mouth 2 (two) times daily. , Disp: , Rfl:  .  potassium chloride (K-DUR,KLOR-CON) 20 MEQ tablet, Take 1 tablet (20 mEq total) by mouth daily., Disp: 30 tablet, Rfl: 0 .  potassium chloride 20 MEQ TBCR, Take 20 mEq by mouth once a week. Take with Metolazone, Disp: 90 tablet, Rfl: 1 .  rOPINIRole (REQUIP) 0.5 MG tablet, Take 0.5 mg by mouth at bedtime. , Disp: , Rfl:  .  sertraline (ZOLOFT) 100 MG tablet, Take 150 mg by mouth daily., Disp: , Rfl:  .  simvastatin (ZOCOR) 20 MG tablet, Take 20 mg by mouth daily., Disp: , Rfl:  .  spironolactone (ALDACTONE) 25 MG tablet, Take 1 tablet (25 mg total) by mouth at bedtime., Disp: 30 tablet, Rfl: 3 .  torsemide (DEMADEX) 20 MG tablet, Take 1 tablet (20 mg total) by mouth 2 (two) times daily., Disp: 120 tablet, Rfl: 3 Allergies  Allergen Reactions  . Codeine Itching  . Gabapentin Itching  . Haldol [Haloperidol Lactate]  Other (See Comments)    Dizziness, hallucinations  . Levofloxacin     Other reaction(s): Other (See Comments) Projectile vomiting   . Lisinopril Itching and Swelling  . Losartan Itching  . Morphine And Related Nausea And Vomiting  . Penicillins Nausea And Vomiting    "Vomiting, upset stomach, Headache" Has patient had a PCN reaction causing immediate rash, facial/tongue/throat swelling, SOB or lightheadedness with hypotension: No Has patient had a PCN reaction causing severe rash involving mucus membranes or skin necrosis: No Has patient had a PCN reaction that required hospitalization: No Has patient had a PCN reaction occurring within the last 10 years: No If all of the above answers are "NO", then may proceed with  Cephalosporin use.      Social History   Social History  . Marital status: Single    Spouse name: N/A  . Number of children: N/A  . Years of education: N/A   Occupational History  . Not on file.   Social History Main Topics  . Smoking status: Never Smoker  . Smokeless tobacco: Never Used  . Alcohol use No  . Drug use: No  . Sexual activity: Not on file   Other Topics Concern  . Not on file   Social History Narrative  . No narrative on file    Physical Exam  Constitutional: She is oriented to person, place, and time.  Cardiovascular: Normal rate and regular rhythm.   Pulmonary/Chest: Effort normal and breath sounds normal. No respiratory distress. She has no wheezes. She has no rales.  Abdominal: Soft. She exhibits no distension.  Musculoskeletal: Normal range of motion. She exhibits no edema.  Neurological: She is alert and oriented to person, place, and time.  Skin: Skin is warm and dry.        SAFE - 03/01/17 1700      Situation   Admitting diagnosis CHF   Heart failure history Exisiting   Comorbidities CKD/renal insufficiency;DM;HTN;Sleep Apnea   Readmitted within 30 days Yes   Hospital admission within past 12 months Yes   number of hospital admissions 3   number of ED visits 3     Assessment   Lives alone No   Primary support person Sister   Mode of transportation personal car   Other services involved None   Home equipement Cane;Home O2;Scale     Weight   Weighs self daily Yes   Scale provided Yes   Records on weight chart Yes     Resources   Has "Living better w/heart failure" book Yes   Has HF Zone tool Yes   Able to identify yellow zone signs/when to call MD Yes   Records zone daily Yes     Medications   Uses a pill box No   Who stocks the pill box Paramedicine   Pill box checked this visit No   Pill box refilled this visit No   Difficulty obtaining medications No   Mail order medications Yes   New medications at home today No    Missed one or more doses of medications per week No     Nutrition   Patient receives meals on wheels No   Patient follows low sodium diet Yes   Has foods at home that meet the current recommended diet Yes   Patient follows low sugar/card diet Yes   Nutritional concerns/issues No     Activity Level   ADL's/Mobility Independent   How many feet can patient ambulate >100'  Urine   Difficulty urinating No   Changes in urine None        Future Appointments Date Time Provider [place]  06/05/2017 11:30 AM [facility] PA/NP [facility] None   BP 120/80 (BP Location: Left Arm, Patient Position: Sitting, Cuff Size: Normal)   Pulse 70   Resp 18   Wt 183 lb 12.8 oz (83.4 kg)   SpO2 99%   BMI 32.56 kg/m  Weight yesterday- 180 lbs Last visit weight- 182.8 lbs  Ms Blackham was seen at home today and reports feeling generally well. Se denied SOB but stated dizziness is persistent. This is a constant complaint for her and physicians are aware. She had an increase in weight of 3.8 lbs overnight. The clinic staff was notified and advised she needs take an additional torsemide tonight only. Medications were verified and her pillbox was was refilled.   Time spent with patient: 45 minutes  [name], EMT 04/12/17  ACTION: Home visit completed Next visit planned for 1 week

## 2017-04-15 ENCOUNTER — Telehealth: Payer: Self-pay | Admitting: *Deleted

## 2017-04-16 NOTE — Telephone Encounter (Signed)
Spoke with [name] on the phone confirming [employer] time.  Michelle Andrade wanted to update me on her gastric polyp biopsy stating that the results came back precancerous.  The plan is to have the remaining portion of the polyp removed in mid September.  She wanted me to updated the HF Clinic.  NP Jettie Booze made aware of results.

## 2017-04-19 ENCOUNTER — Other Ambulatory Visit (HOSPITAL_COMMUNITY): Payer: Self-pay

## 2017-04-19 NOTE — Progress Notes (Signed)
Paramedicine Encounter    Patient ID: Michelle Andrade, female    DOB: 1961-05-20, 56 y.o.   MRN: [id number]   Patient Care Team: Henderson Baltimore, [name] as PCP - General (Nurse Practitioner)  Patient Active Problem List   Diagnosis Date Noted  . AKI (acute kidney injury) ([place])   . Type 2 diabetes mellitus with stage 3 chronic kidney disease ([place]) 02/13/2017  . Acute on chronic systolic (congestive) heart failure ([place])   . CKD (chronic kidney disease), stage III   . Constipated   . CHF (congestive heart failure) ([place]) 02/09/2017  . Pulmonary vascular congestion 02/09/2017  . Hypertension   . Sleep apnea   . PAD (peripheral artery disease) ([place]) 01/12/2014    Current Outpatient Prescriptions:  .  amiodarone (PACERONE) 200 MG tablet, Take 200 mg by mouth daily., Disp: , Rfl:  .  aspirin 81 MG chewable tablet, Chew 81 mg by mouth daily., Disp: , Rfl:  .  Calcium Carbonate-Vitamin D3 (CALCIUM 600/VITAMIN D) 600-400 MG-UNIT TABS, Take 1 tablet by mouth 2 (two) times daily., Disp: , Rfl:  .  carvedilol (COREG) 12.5 MG tablet, Take 1 tablet (12.5 mg total) by mouth 2 (two) times daily with a meal., Disp: 60 tablet, Rfl: 0 .  ferrous sulfate 325 (65 FE) MG tablet, Take 325 mg by mouth 3 (three) times daily. , Disp: , Rfl:  .  glipiZIDE (GLUCOTROL) 5 MG tablet, Take 2.5 mg by mouth daily before breakfast., Disp: , Rfl:  .  hydrALAZINE (APRESOLINE) 25 MG tablet, Take 1 tablet (25 mg total) by mouth 3 (three) times daily., Disp: 90 tablet, Rfl: 3 .  Investigational - Study Medication, Take 1 tablet by mouth 2 (two) times daily. Study name: Galactic Heart Failure Study Additional study details: Omecamtiv Mecarbil or Placebo, Disp: 1 each, Rfl: PRN .  isosorbide mononitrate (IMDUR) 60 MG 24 hr tablet, Take 1 tablet (60 mg total) by mouth daily., Disp: 30 tablet, Rfl: 0 .  loratadine (CLARITIN) 10 MG tablet, Take 10 mg by mouth daily., Disp: , Rfl:  .  magnesium oxide (MAG-OX) 400 MG tablet, Take 400  mg by mouth 2 (two) times daily., Disp: , Rfl:  .  metolazone (ZAROXOLYN) 2.5 MG tablet, Take 1 tablet (2.5 mg total) by mouth once a week. Monday, Disp: 12 tablet, Rfl: 0 .  Multiple Vitamin (MULTIVITAMIN) tablet, Take 1 tablet by mouth daily., Disp: , Rfl:  .  omeprazole (PRILOSEC) 20 MG capsule, Take 20 mg by mouth 2 (two) times daily. , Disp: , Rfl:  .  potassium chloride (K-DUR,KLOR-CON) 20 MEQ tablet, Take 1 tablet (20 mEq total) by mouth daily., Disp: 30 tablet, Rfl: 0 .  potassium chloride 20 MEQ TBCR, Take 20 mEq by mouth once a week. Take with Metolazone, Disp: 90 tablet, Rfl: 1 .  rOPINIRole (REQUIP) 0.5 MG tablet, Take 0.5 mg by mouth at bedtime. , Disp: , Rfl:  .  sertraline (ZOLOFT) 100 MG tablet, Take 150 mg by mouth daily., Disp: , Rfl:  .  simvastatin (ZOCOR) 20 MG tablet, Take 20 mg by mouth daily., Disp: , Rfl:  .  spironolactone (ALDACTONE) 25 MG tablet, Take 1 tablet (25 mg total) by mouth at bedtime., Disp: 30 tablet, Rfl: 3 .  torsemide (DEMADEX) 20 MG tablet, Take 1 tablet (20 mg total) by mouth 2 (two) times daily., Disp: 120 tablet, Rfl: 3 Allergies  Allergen Reactions  . Codeine Itching  . Gabapentin Itching  . Haldol [Haloperidol Lactate] Other (  See Comments)    Dizziness, hallucinations  . Levofloxacin     Other reaction(s): Other (See Comments) Projectile vomiting   . Lisinopril Itching and Swelling  . Losartan Itching  . Morphine And Related Nausea And Vomiting  . Penicillins Nausea And Vomiting    "Vomiting, upset stomach, Headache" Has patient had a PCN reaction causing immediate rash, facial/tongue/throat swelling, SOB or lightheadedness with hypotension: No Has patient had a PCN reaction causing severe rash involving mucus membranes or skin necrosis: No Has patient had a PCN reaction that required hospitalization: No Has patient had a PCN reaction occurring within the last 10 years: No If all of the above answers are "NO", then may proceed with  Cephalosporin use.      Social History   Social History  . Marital status: Single    Spouse name: N/A  . Number of children: N/A  . Years of education: N/A   Occupational History  . Not on file.   Social History Main Topics  . Smoking status: Never Smoker  . Smokeless tobacco: Never Used  . Alcohol use No  . Drug use: No  . Sexual activity: Not on file   Other Topics Concern  . Not on file   Social History Narrative  . No narrative on file    Physical Exam  Constitutional: She is oriented to person, place, and time.  Cardiovascular: Normal rate and regular rhythm.   Pulmonary/Chest: Effort normal. No respiratory distress. She has no wheezes. She has no rales.  Abdominal: Soft. She exhibits no distension.  Musculoskeletal: Normal range of motion. She exhibits no edema.  Neurological: She is alert and oriented to person, place, and time.  Skin: Skin is warm and dry.        SAFE - 03/01/17 1700      Situation   Admitting diagnosis CHF   Heart failure history Exisiting   Comorbidities CKD/renal insufficiency;DM;HTN;Sleep Apnea   Readmitted within 30 days Yes   Hospital admission within past 12 months Yes   number of hospital admissions 3   number of ED visits 3     Assessment   Lives alone No   Primary support person Sister   Mode of transportation personal car   Other services involved None   Home equipement Cane;Home O2;Scale     Weight   Weighs self daily Yes   Scale provided Yes   Records on weight chart Yes     Resources   Has "Living better w/heart failure" book Yes   Has HF Zone tool Yes   Able to identify yellow zone signs/when to call MD Yes   Records zone daily Yes     Medications   Uses a pill box No   Who stocks the pill box Paramedicine   Pill box checked this visit No   Pill box refilled this visit No   Difficulty obtaining medications No   Mail order medications Yes   New medications at home today No   Missed one or more doses  of medications per week No     Nutrition   Patient receives meals on wheels No   Patient follows low sodium diet Yes   Has foods at home that meet the current recommended diet Yes   Patient follows low sugar/card diet Yes   Nutritional concerns/issues No     Activity Level   ADL's/Mobility Independent   How many feet can patient ambulate >100'     Urine  Difficulty urinating No   Changes in urine None        Future Appointments Date Time Provider [place]  05/07/2017 10:30 AM [place] [id number] None  06/05/2017 11:30 AM [facility] PA/NP [facility] None   BP 116/72 (BP Location: Left Arm, Patient Position: Sitting, Cuff Size: Normal)   Pulse 66   Resp 18   Wt 183 lb 12.8 oz (83.4 kg)   SpO2 98%   BMI 32.56 kg/m  Weight yesterday- 182 lbs Last visit weight-183 lbs CBG- 153 mg/dl  Ms. Willaims was seen at home today and reports feeling generally well. She denied SOB and headache. She reports being compliant with all medications. She says she is not using any added salt with her food but she does eat deli meats and packaged dinners sometimes. Her medications were verified and her pillbox was refilled.  Time spent with patient: 41 minutes  [name], EMT 04/19/17  ACTION: Home visit completed Next visit planned for 1 week

## 2017-04-22 ENCOUNTER — Telehealth (HOSPITAL_COMMUNITY): Payer: Self-pay | Admitting: *Deleted

## 2017-04-22 NOTE — Telephone Encounter (Signed)
Patient called triage line today stating her stomach felt really bloated after overeating yesterday afternoon and today her weight is up to 187 lbs from 183 lbs yesterday.  Patient is prescribed metolazone every Monday.  I advised her to go ahead and take her metolazone, watch her diet today and fluid intake, and call us back tomorrow with her weight.  Patient understands and agrees with plan with no further questions.

## 2017-04-26 ENCOUNTER — Other Ambulatory Visit (HOSPITAL_COMMUNITY): Payer: Self-pay

## 2017-04-26 NOTE — Progress Notes (Signed)
Paramedicine Encounter    Patient ID: Michelle Andrade, female    DOB: [birth year], 58 y.o.   MRN: [id number]   Patient Care Team: Henderson Baltimore, [name] as PCP - General (Nurse Practitioner)  Patient Active Problem List   Diagnosis Date Noted  . AKI (acute kidney injury) ([place])   . Type 2 diabetes mellitus with stage 3 chronic kidney disease ([place]) 02/13/2017  . Acute on chronic systolic (congestive) heart failure ([place])   . CKD (chronic kidney disease), stage III   . Constipated   . CHF (congestive heart failure) ([place]) 02/09/2017  . Pulmonary vascular congestion 02/09/2017  . Hypertension   . Sleep apnea   . PAD (peripheral artery disease) ([place]) 01/12/2014    Current Outpatient Prescriptions:  .  amiodarone (PACERONE) 200 MG tablet, Take 200 mg by mouth daily., Disp: , Rfl:  .  aspirin 81 MG chewable tablet, Chew 81 mg by mouth daily., Disp: , Rfl:  .  Calcium Carbonate-Vitamin D3 (CALCIUM 600/VITAMIN D) 600-400 MG-UNIT TABS, Take 1 tablet by mouth 2 (two) times daily., Disp: , Rfl:  .  carvedilol (COREG) 12.5 MG tablet, Take 1 tablet (12.5 mg total) by mouth 2 (two) times daily with a meal., Disp: 60 tablet, Rfl: 0 .  ferrous sulfate 325 (65 FE) MG tablet, Take 325 mg by mouth 3 (three) times daily. , Disp: , Rfl:  .  glipiZIDE (GLUCOTROL) 5 MG tablet, Take 2.5 mg by mouth daily before breakfast., Disp: , Rfl:  .  hydrALAZINE (APRESOLINE) 25 MG tablet, Take 1 tablet (25 mg total) by mouth 3 (three) times daily., Disp: 90 tablet, Rfl: 3 .  Investigational - Study Medication, Take 1 tablet by mouth 2 (two) times daily. Study name: Galactic Heart Failure Study Additional study details: Omecamtiv Mecarbil or Placebo, Disp: 1 each, Rfl: PRN .  isosorbide mononitrate (IMDUR) 60 MG 24 hr tablet, Take 1 tablet (60 mg total) by mouth daily., Disp: 30 tablet, Rfl: 0 .  loratadine (CLARITIN) 10 MG tablet, Take 10 mg by mouth daily., Disp: , Rfl:  .  magnesium oxide (MAG-OX) 400 MG tablet, Take 400  mg by mouth 2 (two) times daily., Disp: , Rfl:  .  metolazone (ZAROXOLYN) 2.5 MG tablet, Take 1 tablet (2.5 mg total) by mouth once a week. Monday, Disp: 12 tablet, Rfl: 0 .  Multiple Vitamin (MULTIVITAMIN) tablet, Take 1 tablet by mouth daily., Disp: , Rfl:  .  omeprazole (PRILOSEC) 20 MG capsule, Take 20 mg by mouth 2 (two) times daily. , Disp: , Rfl:  .  potassium chloride (K-DUR,KLOR-CON) 20 MEQ tablet, Take 1 tablet (20 mEq total) by mouth daily., Disp: 30 tablet, Rfl: 0 .  potassium chloride 20 MEQ TBCR, Take 20 mEq by mouth once a week. Take with Metolazone, Disp: 90 tablet, Rfl: 1 .  rOPINIRole (REQUIP) 0.5 MG tablet, Take 0.5 mg by mouth at bedtime. , Disp: , Rfl:  .  sertraline (ZOLOFT) 100 MG tablet, Take 150 mg by mouth daily., Disp: , Rfl:  .  simvastatin (ZOCOR) 20 MG tablet, Take 20 mg by mouth daily., Disp: , Rfl:  .  spironolactone (ALDACTONE) 25 MG tablet, Take 1 tablet (25 mg total) by mouth at bedtime., Disp: 30 tablet, Rfl: 3 .  torsemide (DEMADEX) 20 MG tablet, Take 1 tablet (20 mg total) by mouth 2 (two) times daily., Disp: 120 tablet, Rfl: 3 Allergies  Allergen Reactions  . Codeine Itching  . Gabapentin Itching  . Haldol [Haloperidol Lactate] Other (  See Comments)    Dizziness, hallucinations  . Levofloxacin     Other reaction(s): Other (See Comments) Projectile vomiting   . Lisinopril Itching and Swelling  . Losartan Itching  . Morphine And Related Nausea And Vomiting  . Penicillins Nausea And Vomiting    "Vomiting, upset stomach, Headache" Has patient had a PCN reaction causing immediate rash, facial/tongue/throat swelling, SOB or lightheadedness with hypotension: No Has patient had a PCN reaction causing severe rash involving mucus membranes or skin necrosis: No Has patient had a PCN reaction that required hospitalization: No Has patient had a PCN reaction occurring within the last 10 years: No If all of the above answers are "NO", then may proceed with  Cephalosporin use.      Social History   Social History  . Marital status: Single    Spouse name: N/A  . Number of children: N/A  . Years of education: N/A   Occupational History  . Not on file.   Social History Main Topics  . Smoking status: Never Smoker  . Smokeless tobacco: Never Used  . Alcohol use No  . Drug use: No  . Sexual activity: Not on file   Other Topics Concern  . Not on file   Social History Narrative  . No narrative on file    Physical Exam  Constitutional: She is oriented to person, place, and time.  Cardiovascular: Normal rate and regular rhythm.   Pulmonary/Chest: Effort normal and breath sounds normal. No respiratory distress. She has no wheezes. She has no rales.  Abdominal: Soft. She exhibits no distension.  Musculoskeletal: Normal range of motion. She exhibits no edema.  Neurological: She is alert and oriented to person, place, and time.  Skin: Skin is warm and dry.  Psychiatric: She has a normal mood and affect.        SAFE - 03/01/17 1700      Situation   Admitting diagnosis CHF   Heart failure history Exisiting   Comorbidities CKD/renal insufficiency;DM;HTN;Sleep Apnea   Readmitted within 30 days Yes   Hospital admission within past 12 months Yes   number of hospital admissions 3   number of ED visits 3     Assessment   Lives alone No   Primary support person Sister   Mode of transportation personal car   Other services involved None   Home equipement Cane;Home O2;Scale     Weight   Weighs self daily Yes   Scale provided Yes   Records on weight chart Yes     Resources   Has "Living better w/heart failure" book Yes   Has HF Zone tool Yes   Able to identify yellow zone signs/when to call MD Yes   Records zone daily Yes     Medications   Uses a pill box No   Who stocks the pill box Paramedicine   Pill box checked this visit No   Pill box refilled this visit No   Difficulty obtaining medications No   Mail order  medications Yes   New medications at home today No   Missed one or more doses of medications per week No     Nutrition   Patient receives meals on wheels No   Patient follows low sodium diet Yes   Has foods at home that meet the current recommended diet Yes   Patient follows low sugar/card diet Yes   Nutritional concerns/issues No     Activity Level   ADL's/Mobility Independent   How  many feet can patient ambulate >100'     Urine   Difficulty urinating No   Changes in urine None        Future Appointments Date Time Provider [place]  05/07/2017 10:30 AM [place] [facility] None  06/05/2017 11:30 AM [facility] PA/NP [facility] None   BP 120/76 (BP Location: Left Arm, Patient Position: Sitting, Cuff Size: Normal)   Pulse 68   Wt 184 lb 9.6 oz (83.7 kg)   SpO2 97%   BMI 32.70 kg/m  Weight yesterday- 184.4 lbs Last visit weight- 183.6 lbs CBG- 220 mg/dl  Ms Pollman was seen at home today and reports feeling well. She denied SOB, H/A or dizziness. She stated she had an episode of weight gain earlier this week but stated she realized she has been reading food labels wrong. Instead of looking at nutrition labels at "per serving" she thought the label represented the entire container. She now understands how to read the labels and believes she can do better with her sodium intake. Her medications were verified and her pillbox was refilled.    Time spent with patient: 44 minutes  [name], EMT 04/26/17  ACTION: Home visit completed Next visit planned for 1 week

## 2017-05-02 ENCOUNTER — Other Ambulatory Visit (HOSPITAL_COMMUNITY): Payer: Self-pay

## 2017-05-02 NOTE — Progress Notes (Signed)
Paramedicine Encounter    Patient ID: Michelle Andrade, female    DOB: 10/11/1960, 56 y.o.   MRN: [id number]   Patient Care Team: Henderson Baltimore, [name] as PCP - General (Nurse Practitioner)  Patient Active Problem List   Diagnosis Date Noted  . AKI (acute kidney injury) ([place])   . Type 2 diabetes mellitus with stage 3 chronic kidney disease ([place]) 02/13/2017  . Acute on chronic systolic (congestive) heart failure ([place])   . CKD (chronic kidney disease), stage III   . Constipated   . CHF (congestive heart failure) ([place]) 02/09/2017  . Pulmonary vascular congestion 02/09/2017  . Hypertension   . Sleep apnea   . PAD (peripheral artery disease) ([place]) 01/12/2014    Current Outpatient Prescriptions:  .  amiodarone (PACERONE) 200 MG tablet, Take 200 mg by mouth daily., Disp: , Rfl:  .  aspirin 81 MG chewable tablet, Chew 81 mg by mouth daily., Disp: , Rfl:  .  Calcium Carbonate-Vitamin D3 (CALCIUM 600/VITAMIN D) 600-400 MG-UNIT TABS, Take 1 tablet by mouth 2 (two) times daily., Disp: , Rfl:  .  carvedilol (COREG) 12.5 MG tablet, Take 1 tablet (12.5 mg total) by mouth 2 (two) times daily with a meal., Disp: 60 tablet, Rfl: 0 .  ferrous sulfate 325 (65 FE) MG tablet, Take 325 mg by mouth 3 (three) times daily. , Disp: , Rfl:  .  glipiZIDE (GLUCOTROL) 5 MG tablet, Take 2.5 mg by mouth daily before breakfast., Disp: , Rfl:  .  hydrALAZINE (APRESOLINE) 25 MG tablet, Take 1 tablet (25 mg total) by mouth 3 (three) times daily., Disp: 90 tablet, Rfl: 3 .  Investigational - Study Medication, Take 1 tablet by mouth 2 (two) times daily. Study name: Galactic Heart Failure Study Additional study details: Omecamtiv Mecarbil or Placebo, Disp: 1 each, Rfl: PRN .  isosorbide mononitrate (IMDUR) 60 MG 24 hr tablet, Take 1 tablet (60 mg total) by mouth daily., Disp: 30 tablet, Rfl: 0 .  loratadine (CLARITIN) 10 MG tablet, Take 10 mg by mouth daily., Disp: , Rfl:  .  metolazone (ZAROXOLYN) 2.5 MG tablet, Take 1  tablet (2.5 mg total) by mouth once a week. Monday, Disp: 12 tablet, Rfl: 0 .  Multiple Vitamin (MULTIVITAMIN) tablet, Take 1 tablet by mouth daily., Disp: , Rfl:  .  omeprazole (PRILOSEC) 20 MG capsule, Take 20 mg by mouth 2 (two) times daily. , Disp: , Rfl:  .  potassium chloride (K-DUR,KLOR-CON) 20 MEQ tablet, Take 1 tablet (20 mEq total) by mouth daily., Disp: 30 tablet, Rfl: 0 .  potassium chloride 20 MEQ TBCR, Take 20 mEq by mouth once a week. Take with Metolazone, Disp: 90 tablet, Rfl: 1 .  rOPINIRole (REQUIP) 0.5 MG tablet, Take 0.5 mg by mouth at bedtime. , Disp: , Rfl:  .  sertraline (ZOLOFT) 100 MG tablet, Take 150 mg by mouth daily., Disp: , Rfl:  .  simvastatin (ZOCOR) 20 MG tablet, Take 20 mg by mouth daily., Disp: , Rfl:  .  spironolactone (ALDACTONE) 25 MG tablet, Take 1 tablet (25 mg total) by mouth at bedtime., Disp: 30 tablet, Rfl: 3 .  torsemide (DEMADEX) 20 MG tablet, Take 1 tablet (20 mg total) by mouth 2 (two) times daily., Disp: 120 tablet, Rfl: 3 .  magnesium oxide (MAG-OX) 400 MG tablet, Take 400 mg by mouth 2 (two) times daily., Disp: , Rfl:  Allergies  Allergen Reactions  . Codeine Itching  . Gabapentin Itching  . Haldol [Haloperidol Lactate] Other (  See Comments)    Dizziness, hallucinations  . Levofloxacin     Other reaction(s): Other (See Comments) Projectile vomiting   . Lisinopril Itching and Swelling  . Losartan Itching  . Morphine And Related Nausea And Vomiting  . Penicillins Nausea And Vomiting    "Vomiting, upset stomach, Headache" Has patient had a PCN reaction causing immediate rash, facial/tongue/throat swelling, SOB or lightheadedness with hypotension: No Has patient had a PCN reaction causing severe rash involving mucus membranes or skin necrosis: No Has patient had a PCN reaction that required hospitalization: No Has patient had a PCN reaction occurring within the last 10 years: No If all of the above answers are "NO", then may proceed with  Cephalosporin use.      Social History   Social History  . Marital status: Single    Spouse name: N/A  . Number of children: N/A  . Years of education: N/A   Occupational History  . Not on file.   Social History Main Topics  . Smoking status: Never Smoker  . Smokeless tobacco: Never Used  . Alcohol use No  . Drug use: No  . Sexual activity: Not on file   Other Topics Concern  . Not on file   Social History Narrative  . No narrative on file    Physical Exam  Constitutional: She is oriented to person, place, and time.  Cardiovascular: Normal rate and regular rhythm.   Pulmonary/Chest: Effort normal. No respiratory distress. She has no wheezes. She has no rales.  Abdominal: Soft.  Musculoskeletal: Normal range of motion. She exhibits no edema.  Neurological: She is alert and oriented to person, place, and time.  Skin: Skin is warm.  Psychiatric: She has a normal mood and affect.        Future Appointments Date Time Provider [place]  05/07/2017 10:30 AM [place] 2 [facility] None  06/05/2017 11:30 AM [facility] PA/NP [facility] None   BP 116/78 (BP Location: Left Arm, Patient Position: Sitting, Cuff Size: Normal)   Pulse 72   Resp 16   Wt 184 lb 12.8 oz (83.8 kg)   SpO2 96%   BMI 32.74 kg/m  Weight yesterday- 184.2 lbs Last visit weight- 184.6 lbs  Michelle Andrade was seen at home today and reports feeling well. She denied SOB, dizziness or headache. She continues to sleep in a recliner but that is not new. She has been compliant with her medications which were verified today and her pillbox was refilled. She was placed on the ReDS clip for training purposes and had an acceptable reading.   ReDS clip reading: 32%  Time spent with patient: 44 minutes  Michelle Andrade, EMT 05/02/17  ACTION: Home visit completed Next visit planned for 1 week

## 2017-05-07 ENCOUNTER — Encounter: Payer: Self-pay | Admitting: *Deleted

## 2017-05-07 DIAGNOSIS — Z006 Encounter for examination for normal comparison and control in clinical research program: Secondary | ICD-10-CM

## 2017-05-07 NOTE — Progress Notes (Signed)
RESEARCH ENCOUNTER  Patient ID: Michelle Andrade  DOB: [birth year]  [name] presented to the [facility] for Week 12 visit of the [facility].  No signs/symptoms of ACS since the last visit. Subject states compliance with IP, IP returned, and additional IP dispensed.  Subject states that she is having her gastric polyps removed Friday and she will call the [facility] with an update after the procedure.     Patient will follow up with [facility] in 12 weeks.

## 2017-05-08 ENCOUNTER — Other Ambulatory Visit (HOSPITAL_COMMUNITY): Payer: Self-pay

## 2017-05-08 NOTE — Progress Notes (Signed)
Paramedicine Encounter    Patient ID: Michelle Andrade, female    DOB: Jan 17, 1961, 56 y.o.   MRN: [id number]   Patient Care Team: Henderson Baltimore, [name] as PCP - General (Nurse Practitioner)  Patient Active Problem List   Diagnosis Date Noted  . AKI (acute kidney injury) ([place])   . Type 2 diabetes mellitus with stage 3 chronic kidney disease ([place]) 02/13/2017  . Acute on chronic systolic (congestive) heart failure ([place])   . CKD (chronic kidney disease), stage III   . Constipated   . CHF (congestive heart failure) ([place]) 02/09/2017  . Pulmonary vascular congestion 02/09/2017  . Hypertension   . Sleep apnea   . PAD (peripheral artery disease) ([place]) 01/12/2014    Current Outpatient Prescriptions:  .  amiodarone (PACERONE) 200 MG tablet, Take 200 mg by mouth daily., Disp: , Rfl:  .  aspirin 81 MG chewable tablet, Chew 81 mg by mouth daily., Disp: , Rfl:  .  Calcium Carbonate-Vitamin D3 (CALCIUM 600/VITAMIN D) 600-400 MG-UNIT TABS, Take 1 tablet by mouth 2 (two) times daily., Disp: , Rfl:  .  carvedilol (COREG) 12.5 MG tablet, Take 1 tablet (12.5 mg total) by mouth 2 (two) times daily with a meal., Disp: 60 tablet, Rfl: 0 .  ferrous sulfate 325 (65 FE) MG tablet, Take 325 mg by mouth 3 (three) times daily. , Disp: , Rfl:  .  glipiZIDE (GLUCOTROL) 5 MG tablet, Take 2.5 mg by mouth daily before breakfast., Disp: , Rfl:  .  hydrALAZINE (APRESOLINE) 25 MG tablet, Take 1 tablet (25 mg total) by mouth 3 (three) times daily., Disp: 90 tablet, Rfl: 3 .  Investigational - Study Medication, Take 1 tablet by mouth 2 (two) times daily. Study name: Galactic Heart Failure Study Additional study details: Omecamtiv Mecarbil or Placebo, Disp: 1 each, Rfl: PRN .  isosorbide mononitrate (IMDUR) 60 MG 24 hr tablet, Take 1 tablet (60 mg total) by mouth daily., Disp: 30 tablet, Rfl: 0 .  loratadine (CLARITIN) 10 MG tablet, Take 10 mg by mouth daily., Disp: , Rfl:  .  magnesium oxide (MAG-OX) 400 MG tablet, Take 400  mg by mouth 2 (two) times daily., Disp: , Rfl:  .  metolazone (ZAROXOLYN) 2.5 MG tablet, Take 1 tablet (2.5 mg total) by mouth once a week. Monday, Disp: 12 tablet, Rfl: 0 .  omeprazole (PRILOSEC) 20 MG capsule, Take 20 mg by mouth 2 (two) times daily. , Disp: , Rfl:  .  potassium chloride (K-DUR,KLOR-CON) 20 MEQ tablet, Take 1 tablet (20 mEq total) by mouth daily., Disp: 30 tablet, Rfl: 0 .  potassium chloride 20 MEQ TBCR, Take 20 mEq by mouth once a week. Take with Metolazone, Disp: 90 tablet, Rfl: 1 .  rOPINIRole (REQUIP) 0.5 MG tablet, Take 0.5 mg by mouth at bedtime. , Disp: , Rfl:  .  sertraline (ZOLOFT) 100 MG tablet, Take 150 mg by mouth daily., Disp: , Rfl:  .  simvastatin (ZOCOR) 20 MG tablet, Take 20 mg by mouth daily., Disp: , Rfl:  .  spironolactone (ALDACTONE) 25 MG tablet, Take 1 tablet (25 mg total) by mouth at bedtime., Disp: 30 tablet, Rfl: 3 .  torsemide (DEMADEX) 20 MG tablet, Take 1 tablet (20 mg total) by mouth 2 (two) times daily., Disp: 120 tablet, Rfl: 3 .  Multiple Vitamin (MULTIVITAMIN) tablet, Take 1 tablet by mouth daily., Disp: , Rfl:  Allergies  Allergen Reactions  . Codeine Itching  . Gabapentin Itching  . Haldol [Haloperidol Lactate] Other (  See Comments)    Dizziness, hallucinations  . Levofloxacin     Other reaction(s): Other (See Comments) Projectile vomiting   . Lisinopril Itching and Swelling  . Losartan Itching  . Morphine And Related Nausea And Vomiting  . Penicillins Nausea And Vomiting    "Vomiting, upset stomach, Headache" Has patient had a PCN reaction causing immediate rash, facial/tongue/throat swelling, SOB or lightheadedness with hypotension: No Has patient had a PCN reaction causing severe rash involving mucus membranes or skin necrosis: No Has patient had a PCN reaction that required hospitalization: No Has patient had a PCN reaction occurring within the last 10 years: No If all of the above answers are "NO", then may proceed with  Cephalosporin use.      Social History   Social History  . Marital status: Single    Spouse name: N/A  . Number of children: N/A  . Years of education: N/A   Occupational History  . Not on file.   Social History Main Topics  . Smoking status: Never Smoker  . Smokeless tobacco: Never Used  . Alcohol use No  . Drug use: No  . Sexual activity: Not on file   Other Topics Concern  . Not on file   Social History Narrative  . No narrative on file    Physical Exam  Constitutional: She is oriented to person, place, and time.  Cardiovascular: Normal rate and regular rhythm.   Pulmonary/Chest: Effort normal and breath sounds normal. No respiratory distress.  Abdominal: Soft. She exhibits no distension.  Musculoskeletal: Normal range of motion. She exhibits no edema.  Neurological: She is alert and oriented to person, place, and time.  Skin: Skin is warm and dry.  Psychiatric: She has a normal mood and affect.        Future Appointments Date Time Provider [place]  06/05/2017 11:30 AM [facility] PA/NP [facility] None  08/06/2017 10:30 AM [name] 1 [facility] None   BP 128/72 (BP Location: Left Arm, Patient Position: Sitting, Cuff Size: Normal)   Pulse 70   Resp 18   Wt 183 lb 12.8 oz (83.4 kg)   SpO2 96%   BMI 32.56 kg/m  Weight yesterday- 184 lb Last visit weight- 184 lb CBG- 87 mg/dl  Ms Mihalko was seen at home today and reported feeling well. She denied SOB, headache or dizziness. She has been taking all medications as prescribed. All medications were verified and her pillbox was refilled. 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 The only medication changes required by the physician for the procedure were to withhold any diabetic medications, ergo no glipizide was put in her Friday morning pillbox.   Time spent with patient: 49 minutes  [name],  EMT 05/08/17  ACTION: Home visit completed Next visit planned for 1 week

## 2017-05-16 ENCOUNTER — Other Ambulatory Visit (HOSPITAL_COMMUNITY): Payer: Self-pay

## 2017-05-16 NOTE — Progress Notes (Signed)
Paramedicine Encounter    Patient ID: Michelle Andrade, female    DOB: 1961-04-26, 56 y.o.   MRN: [id number]   Patient Care Team: Henderson Baltimore, [name] as PCP - General (Nurse Practitioner)  Patient Active Problem List   Diagnosis Date Noted  . AKI (acute kidney injury) ([place])   . Type 2 diabetes mellitus with stage 3 chronic kidney disease ([place]) 02/13/2017  . Acute on chronic systolic (congestive) heart failure ([place])   . CKD (chronic kidney disease), stage III   . Constipated   . CHF (congestive heart failure) ([place]) 02/09/2017  . Pulmonary vascular congestion 02/09/2017  . Hypertension   . Sleep apnea   . PAD (peripheral artery disease) ([place]) 01/12/2014    Current Outpatient Prescriptions:  .  amiodarone (PACERONE) 200 MG tablet, Take 200 mg by mouth daily., Disp: , Rfl:  .  aspirin 81 MG chewable tablet, Chew 81 mg by mouth daily., Disp: , Rfl:  .  Calcium Carbonate-Vitamin D3 (CALCIUM 600/VITAMIN D) 600-400 MG-UNIT TABS, Take 1 tablet by mouth 2 (two) times daily., Disp: , Rfl:  .  carvedilol (COREG) 12.5 MG tablet, Take 1 tablet (12.5 mg total) by mouth 2 (two) times daily with a meal., Disp: 60 tablet, Rfl: 0 .  ferrous sulfate 325 (65 FE) MG tablet, Take 325 mg by mouth 3 (three) times daily. , Disp: , Rfl:  .  glipiZIDE (GLUCOTROL) 5 MG tablet, Take 2.5 mg by mouth daily before breakfast., Disp: , Rfl:  .  hydrALAZINE (APRESOLINE) 25 MG tablet, Take 1 tablet (25 mg total) by mouth 3 (three) times daily., Disp: 90 tablet, Rfl: 3 .  Investigational - Study Medication, Take 1 tablet by mouth 2 (two) times daily. Study name: Galactic Heart Failure Study Additional study details: Omecamtiv Mecarbil or Placebo, Disp: 1 each, Rfl: PRN .  isosorbide mononitrate (IMDUR) 60 MG 24 hr tablet, Take 1 tablet (60 mg total) by mouth daily., Disp: 30 tablet, Rfl: 0 .  loratadine (CLARITIN) 10 MG tablet, Take 10 mg by mouth daily., Disp: , Rfl:  .  magnesium oxide (MAG-OX) 400 MG tablet, Take 400  mg by mouth 2 (two) times daily., Disp: , Rfl:  .  metolazone (ZAROXOLYN) 2.5 MG tablet, Take 1 tablet (2.5 mg total) by mouth once a week. Monday, Disp: 12 tablet, Rfl: 0 .  Multiple Vitamin (MULTIVITAMIN) tablet, Take 1 tablet by mouth daily., Disp: , Rfl:  .  omeprazole (PRILOSEC) 20 MG capsule, Take 20 mg by mouth 2 (two) times daily. , Disp: , Rfl:  .  potassium chloride (K-DUR,KLOR-CON) 20 MEQ tablet, Take 1 tablet (20 mEq total) by mouth daily., Disp: 30 tablet, Rfl: 0 .  potassium chloride 20 MEQ TBCR, Take 20 mEq by mouth once a week. Take with Metolazone, Disp: 90 tablet, Rfl: 1 .  rOPINIRole (REQUIP) 0.5 MG tablet, Take 0.5 mg by mouth at bedtime. , Disp: , Rfl:  .  sertraline (ZOLOFT) 100 MG tablet, Take 150 mg by mouth daily., Disp: , Rfl:  .  simvastatin (ZOCOR) 20 MG tablet, Take 20 mg by mouth daily., Disp: , Rfl:  .  spironolactone (ALDACTONE) 25 MG tablet, Take 1 tablet (25 mg total) by mouth at bedtime., Disp: 30 tablet, Rfl: 3 .  torsemide (DEMADEX) 20 MG tablet, Take 1 tablet (20 mg total) by mouth 2 (two) times daily., Disp: 120 tablet, Rfl: 3 Allergies  Allergen Reactions  . Codeine Itching  . Gabapentin Itching  . Haldol [Haloperidol Lactate] Other (  See Comments)    Dizziness, hallucinations  . Levofloxacin     Other reaction(s): Other (See Comments) Projectile vomiting   . Lisinopril Itching and Swelling  . Losartan Itching  . Morphine And Related Nausea And Vomiting  . Penicillins Nausea And Vomiting    "Vomiting, upset stomach, Headache" Has patient had a PCN reaction causing immediate rash, facial/tongue/throat swelling, SOB or lightheadedness with hypotension: No Has patient had a PCN reaction causing severe rash involving mucus membranes or skin necrosis: No Has patient had a PCN reaction that required hospitalization: No Has patient had a PCN reaction occurring within the last 10 years: No If all of the above answers are "NO", then may proceed with  Cephalosporin use.      Social History   Social History  . Marital status: Single    Spouse name: N/A  . Number of children: N/A  . Years of education: N/A   Occupational History  . Not on file.   Social History Main Topics  . Smoking status: Never Smoker  . Smokeless tobacco: Never Used  . Alcohol use No  . Drug use: No  . Sexual activity: Not on file   Other Topics Concern  . Not on file   Social History Narrative  . No narrative on file    Physical Exam  Constitutional: She is oriented to person, place, and time.  Cardiovascular: Normal rate and regular rhythm.   Pulmonary/Chest: Effort normal and breath sounds normal. No respiratory distress. She has no wheezes. She has no rales.  Abdominal: Soft.  Musculoskeletal: Normal range of motion. She exhibits no edema.  Neurological: She is alert and oriented to person, place, and time.  Skin: Skin is warm and dry.  Psychiatric: She has a normal mood and affect.        Future Appointments Date Time Provider [place]  06/05/2017 11:30 AM [facility] PA/NP [facility] None  08/06/2017 10:30 AM [place] 1 [facility] None   BP 110/70 (BP Location: Left Arm, Patient Position: Sitting, Cuff Size: Normal)   Pulse 60   Resp 18   Wt 184 lb (83.5 kg)   SpO2 96%   BMI 32.59 kg/m  Weight yesterday- 185 lbs Last visit weight- 183.6 lbs  Michelle Andrade was seen at home today and reports feeling well. She denied SOB, dizziness or headaches. She has been compliant with her medications with the exception of last Friday when she had surgery. She misunderstood the directions and did not take her cardiac medicine prior to going in for surgery. She has no concerns right now and says she is prepared for the impending storm. Medications were verified and her pillbox was refilled.  Time spent with patient: 39 minutes  [name], EMT 05/16/17  ACTION: Home visit completed

## 2017-05-23 ENCOUNTER — Other Ambulatory Visit (HOSPITAL_COMMUNITY): Payer: Self-pay

## 2017-05-23 ENCOUNTER — Other Ambulatory Visit (HOSPITAL_COMMUNITY): Payer: Self-pay | Admitting: Pharmacist

## 2017-05-23 MED ORDER — METOLAZONE 2.5 MG PO TABS: 2.5000 mg | ORAL_TABLET | ORAL | 0 refills | Status: DC

## 2017-05-23 NOTE — Progress Notes (Signed)
Paramedicine Encounter    Patient ID: Michelle Andrade, female    DOB: 07/18/1961, 56 y.o.   MRN: [id number]   Patient Care Team: Michelle Baltimore, [name] as PCP - General (Nurse Practitioner)  Patient Active Problem List   Diagnosis Date Noted  . AKI (acute kidney injury) ([place])   . Type 2 diabetes mellitus with stage 3 chronic kidney disease ([place]) 02/13/2017  . Acute on chronic systolic (congestive) heart failure ([place])   . CKD (chronic kidney disease), stage III   . Constipated   . CHF (congestive heart failure) ([place]) 02/09/2017  . Pulmonary vascular congestion 02/09/2017  . Hypertension   . Sleep apnea   . PAD (peripheral artery disease) ([place]) 01/12/2014    Current Outpatient Prescriptions:  .  amiodarone (PACERONE) 200 MG tablet, Take 200 mg by mouth daily., Disp: , Rfl:  .  aspirin 81 MG chewable tablet, Chew 81 mg by mouth daily., Disp: , Rfl:  .  Calcium Carbonate-Vitamin D3 (CALCIUM 600/VITAMIN D) 600-400 MG-UNIT TABS, Take 1 tablet by mouth 2 (two) times daily., Disp: , Rfl:  .  carvedilol (COREG) 12.5 MG tablet, Take 1 tablet (12.5 mg total) by mouth 2 (two) times daily with a meal., Disp: 60 tablet, Rfl: 0 .  ferrous sulfate 325 (65 FE) MG tablet, Take 325 mg by mouth 3 (three) times daily. , Disp: , Rfl:  .  glipiZIDE (GLUCOTROL) 5 MG tablet, Take 2.5 mg by mouth daily before breakfast., Disp: , Rfl:  .  hydrALAZINE (APRESOLINE) 25 MG tablet, Take 1 tablet (25 mg total) by mouth 3 (three) times daily., Disp: 90 tablet, Rfl: 3 .  Investigational - Study Medication, Take 1 tablet by mouth 2 (two) times daily. Study name: Galactic Heart Failure Study Additional study details: Omecamtiv Mecarbil or Placebo, Disp: 1 each, Rfl: PRN .  isosorbide mononitrate (IMDUR) 60 MG 24 hr tablet, Take 1 tablet (60 mg total) by mouth daily., Disp: 30 tablet, Rfl: 0 .  loratadine (CLARITIN) 10 MG tablet, Take 10 mg by mouth daily., Disp: , Rfl:  .  magnesium oxide (MAG-OX) 400 MG tablet, Take 400  mg by mouth 2 (two) times daily., Disp: , Rfl:  .  metolazone (ZAROXOLYN) 2.5 MG tablet, Take 1 tablet (2.5 mg total) by mouth once a week. Monday, Disp: 12 tablet, Rfl: 0 .  Multiple Vitamin (MULTIVITAMIN) tablet, Take 1 tablet by mouth daily., Disp: , Rfl:  .  omeprazole (PRILOSEC) 20 MG capsule, Take 20 mg by mouth 2 (two) times daily. , Disp: , Rfl:  .  potassium chloride (K-DUR,KLOR-CON) 20 MEQ tablet, Take 1 tablet (20 mEq total) by mouth daily., Disp: 30 tablet, Rfl: 0 .  potassium chloride 20 MEQ TBCR, Take 20 mEq by mouth once a week. Take with Metolazone, Disp: 90 tablet, Rfl: 1 .  rOPINIRole (REQUIP) 0.5 MG tablet, Take 0.5 mg by mouth at bedtime. , Disp: , Rfl:  .  sertraline (ZOLOFT) 100 MG tablet, Take 150 mg by mouth daily., Disp: , Rfl:  .  simvastatin (ZOCOR) 20 MG tablet, Take 20 mg by mouth daily., Disp: , Rfl:  .  spironolactone (ALDACTONE) 25 MG tablet, Take 1 tablet (25 mg total) by mouth at bedtime., Disp: 30 tablet, Rfl: 3 .  torsemide (DEMADEX) 20 MG tablet, Take 1 tablet (20 mg total) by mouth 2 (two) times daily., Disp: 120 tablet, Rfl: 3 Allergies  Allergen Reactions  . Codeine Itching  . Gabapentin Itching  . Haldol [Haloperidol Lactate] Other (  See Comments)    Dizziness, hallucinations  . Levofloxacin     Other reaction(s): Other (See Comments) Projectile vomiting   . Lisinopril Itching and Swelling  . Losartan Itching  . Morphine And Related Nausea And Vomiting  . Penicillins Nausea And Vomiting    "Vomiting, upset stomach, Headache" Has patient had a PCN reaction causing immediate rash, facial/tongue/throat swelling, SOB or lightheadedness with hypotension: No Has patient had a PCN reaction causing severe rash involving mucus membranes or skin necrosis: No Has patient had a PCN reaction that required hospitalization: No Has patient had a PCN reaction occurring within the last 10 years: No If all of the above answers are "NO", then may proceed with  Cephalosporin use.      Social History   Social History  . Marital status: Single    Spouse name: N/A  . Number of children: N/A  . Years of education: N/A   Occupational History  . Not on file.   Social History Main Topics  . Smoking status: Never Smoker  . Smokeless tobacco: Never Used  . Alcohol use No  . Drug use: No  . Sexual activity: Not on file   Other Topics Concern  . Not on file   Social History Narrative  . No narrative on file    Physical Exam  Constitutional: She is oriented to person, place, and time.  Cardiovascular: Normal rate and regular rhythm.   Pulmonary/Chest: Effort normal and breath sounds normal. No respiratory distress. She has no wheezes. She has no rales.  Abdominal: Soft.  Musculoskeletal: She exhibits no edema.  Neurological: She is alert and oriented to person, place, and time.  Skin: Skin is warm and dry.  Psychiatric: She has a normal mood and affect.        Future Appointments Date Time Provider [place]  06/05/2017 11:30 AM [facility] PA/NP [facility] None  08/06/2017 10:30 AM [place] 1 [facility] None   BP 118/72 (BP Location: Left Arm, Patient Position: Sitting, Cuff Size: Normal)   Pulse 79   Resp 16   Wt 183 lb 3.2 oz (83.1 kg)   SpO2 96%   BMI 32.45 kg/m  Weight yesterday- 184.2 lbs Last visit weight-  184 lbs CBG- 223 mg/dl  [name] was seen at home today and reports feeling well. She denied SOB and headaches but continues to have dizziness and she sleeps in her recliner. She is doing better with losing her breath while talking with me during visits and she has not been diaphoretic like she was when we first began meeting. Today she stated she needed the clinic to fax a prescription for metolazone to the [facility] in [place]. I contacted Michelle Andrade at the clinic who requested I find a working fax number for her to send the Rx. I contacted the [facility] call Andrade who forwarded me to the  [facility] and ultimately got the correct fax number. This information was then passed on to Michelle Andrade who advised she would send the Rx. Her medications were verified and her pillbox was refilled.   Time spent with patient: 63 minutes  [name], EMT 05/23/17  ACTION: Home visit completed Next visit planned for 1 week

## 2017-05-24 ENCOUNTER — Other Ambulatory Visit (HOSPITAL_COMMUNITY): Payer: Self-pay | Admitting: Pharmacist

## 2017-05-24 MED ORDER — METOLAZONE 2.5 MG PO TABS: 2.5000 mg | ORAL_TABLET | ORAL | 0 refills | Status: DC

## 2017-05-27 ENCOUNTER — Other Ambulatory Visit (HOSPITAL_COMMUNITY): Payer: Self-pay | Admitting: Surgery

## 2017-05-27 MED ORDER — METOLAZONE 2.5 MG PO TABS: 2.5000 mg | ORAL_TABLET | ORAL | 0 refills | Status: DC

## 2017-05-27 NOTE — Telephone Encounter (Signed)
Ms. Ghazarian left a message for me regarding her medications and faxing the new medication prescriptions to the [facility] so that she can have them provided from there.  According to notes, this has already been handled by the [facility].  She does request a refill of Metolazone be sent to [facility] in the meantime however- as she is unsure when she will receive these new medications through the [facility].  I have sent the [facility] [place] for Met[facility]azone.

## 2017-05-30 ENCOUNTER — Other Ambulatory Visit (HOSPITAL_COMMUNITY): Payer: Self-pay

## 2017-05-30 NOTE — Progress Notes (Signed)
Paramedicine Encounter    Patient ID: Michelle Andrade, female    DOB: 1960/10/06, 56 y.o.   MRN: [id number]   Patient Care Team: Henderson Baltimore, [name] as PCP - General (Nurse Practitioner)  Patient Active Problem List   Diagnosis Date Noted  . AKI (acute kidney injury) ([place])   . Type 2 diabetes mellitus with stage 3 chronic kidney disease ([place]) 02/13/2017  . Acute on chronic systolic (congestive) heart failure ([place])   . CKD (chronic kidney disease), stage III   . Constipated   . CHF (congestive heart failure) ([place]) 02/09/2017  . Pulmonary vascular congestion 02/09/2017  . Hypertension   . Sleep apnea   . PAD (peripheral artery disease) ([place]) 01/12/2014    Current Outpatient Prescriptions:  .  amiodarone (PACERONE) 200 MG tablet, Take 200 mg by mouth daily., Disp: , Rfl:  .  aspirin 81 MG chewable tablet, Chew 81 mg by mouth daily., Disp: , Rfl:  .  Calcium Carbonate-Vitamin D3 (CALCIUM 600/VITAMIN D) 600-400 MG-UNIT TABS, Take 1 tablet by mouth 2 (two) times daily., Disp: , Rfl:  .  carvedilol (COREG) 12.5 MG tablet, Take 1 tablet (12.5 mg total) by mouth 2 (two) times daily with a meal., Disp: 60 tablet, Rfl: 0 .  ferrous sulfate 325 (65 FE) MG tablet, Take 325 mg by mouth 3 (three) times daily. , Disp: , Rfl:  .  glipiZIDE (GLUCOTROL) 5 MG tablet, Take 2.5 mg by mouth daily before breakfast., Disp: , Rfl:  .  hydrALAZINE (APRESOLINE) 25 MG tablet, Take 1 tablet (25 mg total) by mouth 3 (three) times daily., Disp: 90 tablet, Rfl: 3 .  Investigational - Study Medication, Take 1 tablet by mouth 2 (two) times daily. Study name: Galactic Heart Failure Study Additional study details: Omecamtiv Mecarbil or Placebo, Disp: 1 each, Rfl: PRN .  isosorbide mononitrate (IMDUR) 60 MG 24 hr tablet, Take 1 tablet (60 mg total) by mouth daily., Disp: 30 tablet, Rfl: 0 .  loratadine (CLARITIN) 10 MG tablet, Take 10 mg by mouth daily., Disp: , Rfl:  .  magnesium oxide (MAG-OX) 400 MG tablet, Take 400  mg by mouth 2 (two) times daily., Disp: , Rfl:  .  metolazone (ZAROXOLYN) 2.5 MG tablet, Take 1 tablet (2.5 mg total) by mouth once a week. Monday, Disp: 12 tablet, Rfl: 0 .  Multiple Vitamin (MULTIVITAMIN) tablet, Take 1 tablet by mouth daily., Disp: , Rfl:  .  omeprazole (PRILOSEC) 20 MG capsule, Take 20 mg by mouth 2 (two) times daily. , Disp: , Rfl:  .  potassium chloride (K-DUR,KLOR-CON) 20 MEQ tablet, Take 1 tablet (20 mEq total) by mouth daily., Disp: 30 tablet, Rfl: 0 .  potassium chloride 20 MEQ TBCR, Take 20 mEq by mouth once a week. Take with Metolazone, Disp: 90 tablet, Rfl: 1 .  rOPINIRole (REQUIP) 0.5 MG tablet, Take 0.5 mg by mouth at bedtime. , Disp: , Rfl:  .  sertraline (ZOLOFT) 100 MG tablet, Take 150 mg by mouth daily., Disp: , Rfl:  .  simvastatin (ZOCOR) 20 MG tablet, Take 20 mg by mouth daily., Disp: , Rfl:  .  spironolactone (ALDACTONE) 25 MG tablet, Take 1 tablet (25 mg total) by mouth at bedtime., Disp: 30 tablet, Rfl: 3 .  torsemide (DEMADEX) 20 MG tablet, Take 1 tablet (20 mg total) by mouth 2 (two) times daily., Disp: 120 tablet, Rfl: 3 Allergies  Allergen Reactions  . Codeine Itching  . Gabapentin Itching  . Haldol [Haloperidol Lactate] Other (  See Comments)    Dizziness, hallucinations  . Levofloxacin     Other reaction(s): Other (See Comments) Projectile vomiting   . Lisinopril Itching and Swelling  . Losartan Itching  . Morphine And Related Nausea And Vomiting  . Penicillins Nausea And Vomiting    "Vomiting, upset stomach, Headache" Has patient had a PCN reaction causing immediate rash, facial/tongue/throat swelling, SOB or lightheadedness with hypotension: No Has patient had a PCN reaction causing severe rash involving mucus membranes or skin necrosis: No Has patient had a PCN reaction that required hospitalization: No Has patient had a PCN reaction occurring within the last 10 years: No If all of the above answers are "NO", then may proceed with  Cephalosporin use.      Social History   Social History  . Marital status: Single    Spouse name: N/A  . Number of children: N/A  . Years of education: N/A   Occupational History  . Not on file.   Social History Main Topics  . Smoking status: Never Smoker  . Smokeless tobacco: Never Used  . Alcohol use No  . Drug use: No  . Sexual activity: Not on file   Other Topics Concern  . Not on file   Social History Narrative  . No narrative on file    Physical Exam  Constitutional: She is oriented to person, place, and time.  Cardiovascular: Normal rate and regular rhythm.   Pulmonary/Chest: Effort normal and breath sounds normal. No respiratory distress. She has no wheezes. She has no rales.  Abdominal: Soft. She exhibits no distension.  Musculoskeletal: Normal range of motion. She exhibits no edema.  Neurological: She is alert and oriented to person, place, and time.  Skin: Skin is warm and dry.  Psychiatric: She has a normal mood and affect.        Future Appointments Date Time Provider [place]  06/05/2017 11:30 AM [facility] PA/NP [facility] None  08/06/2017 10:30 AM [place] 1 [facility] None   BP 108/72 (BP Location: Left Arm, Patient Position: Sitting, Cuff Size: Normal)   Pulse 76   Resp 16   Wt 184 lb (83.5 kg)   SpO2 97%   BMI 32.59 kg/m  Weight yesterday- 183.8 lbs Last visit weight- 183 lbs CBG 183 mg/dl  [name] was seen at home today and reports feeling generally well. She denied SOB, dizziness and headache. She reports being compliant with all her medications. He medications were verified and her pillbox was refilled. She ran out of simvastatin but it was scheduled for delivery today. I left written instructions on how she should add it to her pillbox when it arrived. All other medications were placed in the box and directed.   Time spent with patient: 34 minutes   [name], EMT 05/30/17  ACTION: Home  visit completed Next visit planned for 1 week

## 2017-06-05 ENCOUNTER — Telehealth (HOSPITAL_COMMUNITY): Payer: Self-pay

## 2017-06-05 ENCOUNTER — Other Ambulatory Visit (HOSPITAL_COMMUNITY): Payer: Self-pay

## 2017-06-05 ENCOUNTER — Ambulatory Visit (HOSPITAL_COMMUNITY)
Admission: RE | Admit: 2017-06-05 | Discharge: 2017-06-05 | Disposition: A | Discharge status: Home / Self Care | Payer: Medicare Other | Source: Ambulatory Visit | Attending: Internal Medicine | Admitting: Internal Medicine

## 2017-06-05 VITALS — BP 124/76 | HR 84 | Wt 189.0 lb

## 2017-06-05 DIAGNOSIS — G4733 Obstructive sleep apnea (adult) (pediatric): Secondary | ICD-10-CM | POA: Insufficient documentation

## 2017-06-05 DIAGNOSIS — I252 Old myocardial infarction: Secondary | ICD-10-CM | POA: Diagnosis not present

## 2017-06-05 DIAGNOSIS — E7800 Pure hypercholesterolemia, unspecified: Secondary | ICD-10-CM | POA: Diagnosis not present

## 2017-06-05 DIAGNOSIS — E1122 Type 2 diabetes mellitus with diabetic chronic kidney disease: Secondary | ICD-10-CM | POA: Diagnosis not present

## 2017-06-05 DIAGNOSIS — I5022 Chronic systolic (congestive) heart failure: Secondary | ICD-10-CM | POA: Diagnosis present

## 2017-06-05 DIAGNOSIS — Z880 Allergy status to penicillin: Secondary | ICD-10-CM | POA: Insufficient documentation

## 2017-06-05 DIAGNOSIS — M109 Gout, unspecified: Secondary | ICD-10-CM | POA: Diagnosis not present

## 2017-06-05 DIAGNOSIS — Z7982 Long term (current) use of aspirin: Secondary | ICD-10-CM | POA: Insufficient documentation

## 2017-06-05 DIAGNOSIS — I428 Other cardiomyopathies: Secondary | ICD-10-CM | POA: Diagnosis not present

## 2017-06-05 DIAGNOSIS — Z7984 Long term (current) use of oral hypoglycemic drugs: Secondary | ICD-10-CM | POA: Insufficient documentation

## 2017-06-05 DIAGNOSIS — N183 Chronic kidney disease, stage 3 unspecified: Secondary | ICD-10-CM

## 2017-06-05 DIAGNOSIS — Z95810 Presence of automatic (implantable) cardiac defibrillator: Secondary | ICD-10-CM | POA: Diagnosis not present

## 2017-06-05 DIAGNOSIS — I471 Supraventricular tachycardia: Secondary | ICD-10-CM | POA: Insufficient documentation

## 2017-06-05 DIAGNOSIS — K219 Gastro-esophageal reflux disease without esophagitis: Secondary | ICD-10-CM | POA: Diagnosis not present

## 2017-06-05 DIAGNOSIS — Z79899 Other long term (current) drug therapy: Secondary | ICD-10-CM | POA: Insufficient documentation

## 2017-06-05 DIAGNOSIS — K317 Polyp of stomach and duodenum: Secondary | ICD-10-CM | POA: Diagnosis not present

## 2017-06-05 DIAGNOSIS — I2510 Atherosclerotic heart disease of native coronary artery without angina pectoris: Secondary | ICD-10-CM | POA: Diagnosis not present

## 2017-06-05 DIAGNOSIS — F329 Major depressive disorder, single episode, unspecified: Secondary | ICD-10-CM | POA: Insufficient documentation

## 2017-06-05 DIAGNOSIS — I130 Hypertensive heart and chronic kidney disease with heart failure and stage 1 through stage 4 chronic kidney disease, or unspecified chronic kidney disease: Secondary | ICD-10-CM | POA: Insufficient documentation

## 2017-06-05 DIAGNOSIS — I10 Essential (primary) hypertension: Secondary | ICD-10-CM

## 2017-06-05 DIAGNOSIS — Z006 Encounter for examination for normal comparison and control in clinical research program: Secondary | ICD-10-CM

## 2017-06-05 DIAGNOSIS — K589 Irritable bowel syndrome without diarrhea: Secondary | ICD-10-CM | POA: Insufficient documentation

## 2017-06-05 DIAGNOSIS — D649 Anemia, unspecified: Secondary | ICD-10-CM | POA: Diagnosis not present

## 2017-06-05 LAB — BASIC METABOLIC PANEL
Anion gap: 11 (ref 5–15)
BUN: 51 mg/dL — AB (ref 6–20)
CALCIUM: 9.3 mg/dL (ref 8.9–10.3)
CO2: 27 mmol/L (ref 22–32)
CREATININE: 2.45 mg/dL — AB (ref 0.44–1.00)
Chloride: 98 mmol/L — ABNORMAL LOW (ref 101–111)
GFR calc Af Amer: 24 mL/min — ABNORMAL LOW (ref >60)
GFR, EST NON AFRICAN AMERICAN: 21 mL/min — AB (ref >60)
GLUCOSE: 131 mg/dL — AB (ref 65–99)
Potassium: 3.2 mmol/L — ABNORMAL LOW (ref 3.5–5.1)
Sodium: 136 mmol/L (ref 135–145)

## 2017-06-05 NOTE — Progress Notes (Signed)
Advanced Heart Failure Clinic Note   Primary Cardiologist: Dr. Haroldine Laws   HPI:  Michelle Andrade is a 56 year old female with a past medical history of HTN, chronic systolic CHF s/p BI V ICD ([employer]),atrial tachycardia (2017), NICM (normal cors in 2014), CKD stage III, HLD, DM, OSA w/CPAP, and anemia.  She was admitted 12/26/16-12/27/16 with volume overload at [facility]. Echo that admission with EF 25-30%. She was discharged on Spiro 25mg  daily, Coreg 25mg  BID,and hydralazine 12.5mg  daily. Discharge weight was 177 pounds.   Her baseline creatinine appears to be 1.4-1.6 when reviewing her records from [facility]. Her last office visit with her Cardiologist Dr. Otho Perl was in January 2018 . He noted angioedema with ACE-I and cough with ARB.   She presented to the ED on 02/09/17 with SOB and abdominal bloating. Started on IV Lasix. RHC done and showed Fick output/index 4.5/2.5. PVR 3.8 WU. Discharge weight was 176 pounds.  Went to the ED on 03/15/17 with dizziness, orthostasis. She had taken an extra Lasix that day.   She presents today for regular follow up.  Followed by paramedicine. Overall feeling great. Weight is up 6 lbs from last visit. She states she is not watching her diet as well as she has in the past. She continues to have lightheadedness with rapid standing, which is not related to her medication. She denies chest pain or PND. She sleeps sitting up in a recliner chronically. Trying to watch her fluid intake. Taking all medication as directed.   Review of systems complete and found to be negative unless listed in HPI.    Past Medical History:  Diagnosis Date  . Arthritis 02/08/2017  . 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 CAD (coronary artery disease) 09/01/2013  . CHF (congestive heart failure) ([place]) 06/07/2015  . CKD (chronic kidney disease), stage III 12/09/2013  . Depression 07/27/2013  . Diabetes mellitus  without complication ([place]) 41/96/2229  . GERD (gastroesophageal reflux disease) 07/27/2013  . Gout 12/29/2013  . Heart valve disorder 06/03/2013  . High cholesterol 11/10/2013  . Hypertension 11/10/2013  . IBS (irritable bowel syndrome) 09/27/2014  . Myocardial infarct ([place])   . Sleep apnea 10/13/2013  . Vascular disorder 01/12/2014    Current Outpatient Prescriptions  Medication Sig Dispense Refill  . amiodarone (PACERONE) 200 MG tablet Take 200 mg by mouth daily.    [name] aspirin 81 MG chewable tablet Chew 81 mg by mouth daily.    . Calcium Carbonate-Vitamin D3 (CALCIUM 600/VITAMIN D) 600-400 MG-UNIT TABS Take 1 tablet by mouth 2 (two) times daily.    . carvedilol (COREG) 25 MG tablet Take 12.5 mg by mouth 2 (two) times daily with a meal.    . ferrous sulfate 325 (65 FE) MG tablet Take 325 mg by mouth 3 (three) times daily.     [name] glipiZIDE (GLUCOTROL) 5 MG tablet Take 2.5 mg by mouth daily before breakfast.    . hydrALAZINE (APRESOLINE) 50 MG tablet Take 50 mg by mouth 3 (three) times daily.    . Investigational - Study Medication Take 1 tablet by mouth 2 (two) times daily. Study name: Galactic Heart Failure Study Additional study details: Omecamtiv Mecarbil or Placebo 1 each PRN  . isosorbide mononitrate (IMDUR) 60 MG 24 hr tablet Take 1 tablet (60 mg total) by mouth daily. 30 tablet 0  . loratadine (CLARITIN) 10 MG tablet Take 10 mg by mouth daily.    . Magnesium Oxide 420 MG  TABS Take 840 mg by mouth daily.    . metolazone (ZAROXOLYN) 2.5 MG tablet Take 1 tablet (2.5 mg total) by mouth once a week. Monday 12 tablet 0  . Multiple Vitamin (MULTIVITAMIN) tablet Take 1 tablet by mouth daily.    [name] omeprazole (PRILOSEC) 40 MG capsule Take 40 mg by mouth daily.    . potassium chloride (K-DUR,KLOR-CON) 20 MEQ tablet Take 1 tablet (20 mEq total) by mouth daily. 30 tablet 0  . potassium chloride 20 MEQ TBCR Take 20 mEq by mouth once a week. Take with Metolazone 90 tablet 1  . rOPINIRole  (REQUIP) 0.5 MG tablet Take 0.5 mg by mouth at bedtime.     . sertraline (ZOLOFT) 100 MG tablet Take 150 mg by mouth daily.    . simvastatin (ZOCOR) 20 MG tablet Take 20 mg by mouth daily.    [name] spironolactone (ALDACTONE) 25 MG tablet Take 1 tablet (25 mg total) by mouth at bedtime. 30 tablet 3  . torsemide (DEMADEX) 20 MG tablet Take 1 tablet (20 mg total) by mouth 2 (two) times daily. 120 tablet 3   No current facility-administered medications for this encounter.    Allergies  Allergen Reactions  . Codeine Itching  . Gabapentin Itching  . Haldol [Haloperidol Lactate] Other (See Comments)    Dizziness, hallucinations  . Levofloxacin     Other reaction(s): Other (See Comments) Projectile vomiting   . Lisinopril Itching and Swelling  . Losartan Itching  . Morphine And Related Nausea And Vomiting  . Penicillins Nausea And Vomiting    "Vomiting, upset stomach, Headache" Has patient had a PCN reaction causing immediate rash, facial/tongue/throat swelling, SOB or lightheadedness with hypotension: No Has patient had a PCN reaction causing severe rash involving mucus membranes or skin necrosis: No Has patient had a PCN reaction that required hospitalization: No Has patient had a PCN reaction occurring within the last 10 years: No If all of the above answers are "NO", then may proceed with Cephalosporin use.    Social History   Social History  . Marital status: Single    Spouse name: N/A  . Number of children: N/A  . Years of education: N/A   Occupational History  . Not on file.   Social History Main Topics  . Smoking status: Never Smoker  . Smokeless tobacco: Never Used  . Alcohol use No  . Drug use: No  . Sexual activity: Not on file   Other Topics Concern  . Not on file   Social History Narrative  . No narrative on file   Family History  Problem Relation Age of Onset  . Hypertension Mother    Vitals:   06/05/17 1146  BP: 124/76  Pulse: 84  SpO2: 98%  Weight:  189 lb (85.7 kg)   Wt Readings from Last 3 Encounters:  06/05/17 189 lb (85.7 kg)  05/30/17 184 lb (83.5 kg)  05/23/17 183 lb 3.2 oz (83.1 kg)    PHYSICAL EXAM: General: Well appearing. No resp difficulty. HEENT: Normal Neck: Supple. JVP 5-6. Carotids 2+ bilat; no bruits. No thyromegaly or nodule noted. Cor: PMI nondisplaced. RRR, No M/G/R noted Lungs: CTAB, normal effort. Abdomen: Soft, non-tender, non-distended, no HSM. No bruits or masses. +BS  Extremities: No cyanosis, clubbing, rash, R and LLE no edema.  Neuro: Alert & orientedx3, cranial nerves grossly intact. moves all 4 extremities w/o difficulty. Affect pleasant   ASSESSMENT & PLAN: 1. Chronic systolic CHF: NICM, normal cors in 2014, likely related  to HTN vs. Noncompliance vs. Tachy - medicated with history of atrial tach. : Echo 02/2017 EF 20%, moderate MR, moderate TR. PA pressure 80 mm Hg.   - NYHA II symptoms.  - Volume status stable on exam. Continue torsemide 40 mg BID.  - Continue metolazone 2.5 mg weekly.  - Continue Spiro 25 mg qhs. - Continue isosorbide 60 mg daily - Continue hydralazine 25 mg TID - Continue Coreg 12.5 mg BID. No room to up-titrate with dizziness.  - Reinforced fluid restriction to < 2 L daily, sodium restriction to less than 2000 mg daily, and the importance of daily weights.    2. CKD stage III: Baseline creatinine 1.3-1.6.  - BMET today.   3. Bi V ICD   4. HTN - Well controlled.  Meds as above.    5. History of atrial tachycardia - On Amiodarone 200 mg daily - Recent LFTs stable.    6. Gastric polyp  - had EGD on 04/02/17 found to have gastric polyp that was biopsied.  - [facility]   Labs today. No room to up-titrate meds with lightheadedness. Follow up in 2 months with Echo to look for EF recovery. Continue paramedicine.   Shirley Friar, PA-C 06/05/17

## 2017-06-05 NOTE — Progress Notes (Signed)
Paramedicine Encounter   Patient ID: Michelle Andrade , female,   DOB: [birth year],56 y.o.,  MRN: [id number]   Ms Cassel was seen  at the clinic today and reported feeling well. She has persistent dizziness but it has not increased since her last visit. She denied SOB or headache. Two pillboxes were filled for her to cover next week when I will be on vacation.  [name], EMT 06/05/2017   ACTION: Home visit completed Next visit planned for 2 weeks

## 2017-06-05 NOTE — Progress Notes (Signed)
Advanced Heart Failure Medication Review by a Pharmacist  Does the patient  feel that his/her medications are working for him/her?  yes  Has the patient been experiencing any side effects to the medications prescribed?  no  Does the patient measure his/her own blood pressure or blood glucose at home?  yes   Does the patient have any problems obtaining medications due to transportation or finances?   no  Understanding of regimen: good Understanding of indications: good Potential of compliance: good Patient understands to avoid NSAIDs. Patient understands to avoid decongestants.  Issues to address at subsequent visits: None   Pharmacist comments: Michelle Andrade is a pleasant 56 yo F presenting with her medication bottles and Michelle Andrade (paramedicine) who fills her pillbox for her weekly. She reports good compliance with her regimen and did not have any specific medication-related questions or concerns for me at this time.   [name]. [name], PharmD, BCPS, CPP Clinical Pharmacist Pager: [phone] Phone: [phone] 06/05/2017 11:57 AM      Time with patient: 16 minutes Preparation and documentation time: 2 minutes Total time: 18 minutes

## 2017-06-05 NOTE — Telephone Encounter (Signed)
CHF Clinic appointment reminder call placed to patient for upcoming post-hospital follow up.  LVMTCB to confirm apt.  Patient also reminded to take all medications as prescribed on the day of his/her appointment and to bring all medications to this appointment.  Advised to call our office for tardiness or cancellations/rescheduling needs.  .Bradley, Megan Genevea  

## 2017-06-05 NOTE — Patient Instructions (Addendum)
Routine lab work today. Will notify you of abnormal results, otherwise no news is good news!  Follow up 2 months with Dr. Haroldine Laws and echocardiogram.  Take all medication as prescribed the day of your appointment. Bring all medications with you to your appointment.  Do the following things EVERYDAY: 1) Weigh yourself in the morning before breakfast. Write it down and keep it in a log. 2) Take your medicines as prescribed 3) Eat low salt foods-Limit salt (sodium) to 2000 mg per day.  4) Stay as active as you can everyday 5) Limit all fluids for the day to less than 2 liters

## 2017-06-07 DIAGNOSIS — N184 Chronic kidney disease, stage 4 (severe): Secondary | ICD-10-CM | POA: Insufficient documentation

## 2017-06-10 ENCOUNTER — Telehealth (HOSPITAL_COMMUNITY): Payer: Self-pay

## 2017-06-10 MED ORDER — POTASSIUM CHLORIDE CRYS ER 20 MEQ PO TBCR: 30.0000 meq | EXTENDED_RELEASE_TABLET | Freq: Two times a day (BID) | ORAL | 0 refills | Status: DC

## 2017-06-10 NOTE — Telephone Encounter (Signed)
Pt aware of results and agreeable to med changes. Pt will come at 10/18 at 11 am or BMET w/lab. Med changes made in [facility]

## 2017-06-11 ENCOUNTER — Telehealth (HOSPITAL_COMMUNITY): Payer: Self-pay | Admitting: Cardiology

## 2017-06-11 DIAGNOSIS — I5022 Chronic systolic (congestive) heart failure: Secondary | ICD-10-CM

## 2017-06-11 NOTE — Telephone Encounter (Signed)
-----   Message from Shirley Friar, PA-C sent at 06/05/2017  3:52 PM EDT ----- Increase K to 30 meq BID and recheck 10 days.   Thanks!   Legrand Como [address] Tripp, PA-C 06/05/2017 3:52 PM

## 2017-06-18 ENCOUNTER — Other Ambulatory Visit (HOSPITAL_COMMUNITY): Payer: Self-pay | Admitting: Pharmacist

## 2017-06-18 ENCOUNTER — Other Ambulatory Visit (HOSPITAL_COMMUNITY): Payer: Self-pay

## 2017-06-18 MED ORDER — POTASSIUM CHLORIDE ER 20 MEQ PO TBCR: 20.0000 meq | EXTENDED_RELEASE_TABLET | ORAL | 1 refills | Status: DC

## 2017-06-18 NOTE — Progress Notes (Signed)
Paramedicine Encounter    Patient ID: Michelle Andrade, female    DOB: [birth year], 56 y.o.   MRN: [id number]   Patient Care Team: Henderson Baltimore, [name] as PCP - General (Nurse Practitioner)  Patient Active Problem List   Diagnosis Date Noted  . AKI (acute kidney injury) ([place])   . Type 2 diabetes mellitus with stage 3 chronic kidney disease ([place]) 02/13/2017  . Acute on chronic systolic (congestive) heart failure ([place])   . CKD (chronic kidney disease), stage III ([place])   . Constipated   . CHF (congestive heart failure) ([place]) 02/09/2017  . Pulmonary vascular congestion 02/09/2017  . Hypertension   . Sleep apnea   . PAD (peripheral artery disease) ([place]) 01/12/2014    Current Outpatient Prescriptions:  .  amiodarone (PACERONE) 200 MG tablet, Take 200 mg by mouth daily., Disp: , Rfl:  .  aspirin 81 MG chewable tablet, Chew 81 mg by mouth daily., Disp: , Rfl:  .  Calcium Carbonate-Vitamin D3 (CALCIUM 600/VITAMIN D) 600-400 MG-UNIT TABS, Take 1 tablet by mouth 2 (two) times daily., Disp: , Rfl:  .  carvedilol (COREG) 25 MG tablet, Take 12.5 mg by mouth 2 (two) times daily with a meal., Disp: , Rfl:  .  ferrous sulfate 325 (65 FE) MG tablet, Take 325 mg by mouth 3 (three) times daily. , Disp: , Rfl:  .  glipiZIDE (GLUCOTROL) 5 MG tablet, Take 2.5 mg by mouth daily before breakfast., Disp: , Rfl:  .  hydrALAZINE (APRESOLINE) 50 MG tablet, Take 50 mg by mouth 3 (three) times daily., Disp: , Rfl:  .  Investigational - Study Medication, Take 1 tablet by mouth 2 (two) times daily. Study name: Galactic Heart Failure Study Additional study details: Omecamtiv Mecarbil or Placebo, Disp: 1 each, Rfl: PRN .  isosorbide mononitrate (IMDUR) 60 MG 24 hr tablet, Take 1 tablet (60 mg total) by mouth daily., Disp: 30 tablet, Rfl: 0 .  loratadine (CLARITIN) 10 MG tablet, Take 10 mg by mouth daily., Disp: , Rfl:  .  Magnesium Oxide 420 MG TABS, Take 840 mg by mouth daily., Disp: , Rfl:  .  metolazone (ZAROXOLYN)  2.5 MG tablet, Take 1 tablet (2.5 mg total) by mouth once a week. Monday, Disp: 12 tablet, Rfl: 0 .  Multiple Vitamin (MULTIVITAMIN) tablet, Take 1 tablet by mouth daily., Disp: , Rfl:  .  omeprazole (PRILOSEC) 40 MG capsule, Take 40 mg by mouth daily., Disp: , Rfl:  .  potassium chloride SA (K-DUR,KLOR-CON) 20 MEQ tablet, Take 1.5 tablets (30 mEq total) by mouth 2 (two) times daily., Disp: 30 tablet, Rfl: 0 .  rOPINIRole (REQUIP) 0.5 MG tablet, Take 0.5 mg by mouth at bedtime. , Disp: , Rfl:  .  sertraline (ZOLOFT) 100 MG tablet, Take 150 mg by mouth daily., Disp: , Rfl:  .  simvastatin (ZOCOR) 20 MG tablet, Take 20 mg by mouth daily., Disp: , Rfl:  .  spironolactone (ALDACTONE) 25 MG tablet, Take 1 tablet (25 mg total) by mouth at bedtime., Disp: 30 tablet, Rfl: 3 .  torsemide (DEMADEX) 20 MG tablet, Take 1 tablet (20 mg total) by mouth 2 (two) times daily., Disp: 120 tablet, Rfl: 3 .  Potassium Chloride ER 20 MEQ TBCR, Take 20 mEq by mouth once a week. Take with Metolazone, Disp: 4 tablet, Rfl: 1 Allergies  Allergen Reactions  . Codeine Itching  . Gabapentin Itching  . Haldol [Haloperidol Lactate] Other (See Comments)    Dizziness, hallucinations  . Levofloxacin  Other reaction(s): Other (See Comments) Projectile vomiting   . Lisinopril Itching and Swelling  . Losartan Itching  . Morphine And Related Nausea And Vomiting  . Penicillins Nausea And Vomiting    "Vomiting, upset stomach, Headache" Has patient had a PCN reaction causing immediate rash, facial/tongue/throat swelling, SOB or lightheadedness with hypotension: No Has patient had a PCN reaction causing severe rash involving mucus membranes or skin necrosis: No Has patient had a PCN reaction that required hospitalization: No Has patient had a PCN reaction occurring within the last 10 years: No If all of the above answers are "NO", then may proceed with Cephalosporin use.      Social History   Social History  . Marital  status: Single    Spouse name: N/A  . Number of children: N/A  . Years of education: N/A   Occupational History  . Not on file.   Social History Main Topics  . Smoking status: Never Smoker  . Smokeless tobacco: Never Used  . Alcohol use No  . Drug use: No  . Sexual activity: Not on file   Other Topics Concern  . Not on file   Social History Narrative  . No narrative on file    Physical Exam  Constitutional: She is oriented to person, place, and time.  Cardiovascular: Normal rate and regular rhythm.   Pulmonary/Chest: Effort normal and breath sounds normal. No respiratory distress. She has no wheezes. She has no rales.  Abdominal: Soft. She exhibits no distension.  Musculoskeletal:       Right knee: Normal.       Left knee: Normal.       Right ankle: Normal.       Left ankle: She exhibits swelling. Tenderness.       Feet:  Neurological: She is alert and oriented to person, place, and time.  Skin: Skin is warm and dry.  Psychiatric: She has a normal mood and affect.        Future Appointments Date Time Provider [place]  06/20/2017 11:00 AM [facility] LAB [facility] None  08/06/2017 10:30 AM [place] 1 LBRE-CVRES None  08/07/2017 1:00 PM [place] ECHO 1-BUZZ [facility]  08/07/2017 2:20 PM Bensimhon, Shaune Pascal, MD [facility] None   BP 118/78 (BP Location: Left Arm, Patient Position: Sitting, Cuff Size: Normal)   Pulse 66   Resp 18   Wt 186 lb 9.6 oz (84.6 kg)   SpO2 97%   BMI 33.05 kg/m  Weight yesterday- 187 lb Last visit weight- 189 lb  Ms Leveille was seen at home today and reports feeling generally well. She denied SOB and headaches. She has persistent dizziness which has not changed since her last clinic appointment. She had one incident over the last week where she stated she accidentally took 2 bins of night medications. She reported this occurred because she fell asleep, woke up and forgot she had taken the medicine already. She is  complaining of left ankle pain with swelling noted just anterior to the medial malleolus. She denied having any obvious trauma which could have caused the pain and swelling. She was advised to contact her PCP if the pain persists. Medications were verified and her pillbox was refilled.   Time spent with patient: 44 minutes  [name], EMT 06/18/17  ACTION: Home visit completed Next visit planned for 1 week

## 2017-06-20 ENCOUNTER — Ambulatory Visit (HOSPITAL_COMMUNITY)
Admission: RE | Admit: 2017-06-20 | Discharge: 2017-06-20 | Disposition: A | Discharge status: Home / Self Care | Payer: Medicare Other | Source: Ambulatory Visit | Attending: Cardiology | Admitting: Cardiology

## 2017-06-20 DIAGNOSIS — I5022 Chronic systolic (congestive) heart failure: Secondary | ICD-10-CM | POA: Diagnosis not present

## 2017-06-20 LAB — BASIC METABOLIC PANEL
Anion gap: 10 (ref 5–15)
BUN: 39 mg/dL — ABNORMAL HIGH (ref 6–20)
CALCIUM: 9.1 mg/dL (ref 8.9–10.3)
CO2: 24 mmol/L (ref 22–32)
CREATININE: 2.23 mg/dL — AB (ref 0.44–1.00)
Chloride: 101 mmol/L (ref 101–111)
GFR calc Af Amer: 27 mL/min — ABNORMAL LOW (ref >60)
GFR calc non Af Amer: 23 mL/min — ABNORMAL LOW (ref >60)
GLUCOSE: 148 mg/dL — AB (ref 65–99)
Potassium: 4.4 mmol/L (ref 3.5–5.1)
Sodium: 135 mmol/L (ref 135–145)

## 2017-06-20 NOTE — Addendum Note (Signed)
Addended by: JEFFRIES, Sharlot Gowda on: 06/20/2017 09:10 AM   Modules accepted: Orders

## 2017-06-27 ENCOUNTER — Other Ambulatory Visit (HOSPITAL_COMMUNITY): Payer: Self-pay

## 2017-06-27 NOTE — Progress Notes (Signed)
Paramedicine Encounter    Patient ID: Michelle Andrade, female    DOB: 1961/05/10, 56 y.o.   MRN: [id number]   Patient Care Team: Michelle Baltimore, [name] as PCP - General (Nurse Practitioner)  Patient Active Problem List   Diagnosis Date Noted  . AKI (acute kidney injury) ([place])   . Type 2 diabetes mellitus with stage 3 chronic kidney disease ([place]) 02/13/2017  . Acute on chronic systolic (congestive) heart failure ([place])   . CKD (chronic kidney disease), stage III ([place])   . Constipated   . CHF (congestive heart failure) ([place]) 02/09/2017  . Pulmonary vascular congestion 02/09/2017  . Hypertension   . Sleep apnea   . PAD (peripheral artery disease) ([place]) 01/12/2014    Current Outpatient Prescriptions:  .  amiodarone (PACERONE) 200 MG tablet, Take 200 mg by mouth daily., Disp: , Rfl:  .  aspirin 81 MG chewable tablet, Chew 81 mg by mouth daily., Disp: , Rfl:  .  Calcium Carbonate-Vitamin D3 (CALCIUM 600/VITAMIN D) 600-400 MG-UNIT TABS, Take 1 tablet by mouth 2 (two) times daily., Disp: , Rfl:  .  carvedilol (COREG) 25 MG tablet, Take 12.5 mg by mouth 2 (two) times daily with a meal., Disp: , Rfl:  .  ferrous sulfate 325 (65 FE) MG tablet, Take 325 mg by mouth 3 (three) times daily. , Disp: , Rfl:  .  glipiZIDE (GLUCOTROL) 5 MG tablet, Take 2.5 mg by mouth daily before breakfast., Disp: , Rfl:  .  hydrALAZINE (APRESOLINE) 50 MG tablet, Take 50 mg by mouth 3 (three) times daily., Disp: , Rfl:  .  Investigational - Study Medication, Take 1 tablet by mouth 2 (two) times daily. Study name: Galactic Heart Failure Study Additional study details: Omecamtiv Mecarbil or Placebo, Disp: 1 each, Rfl: PRN .  isosorbide mononitrate (IMDUR) 60 MG 24 hr tablet, Take 1 tablet (60 mg total) by mouth daily., Disp: 30 tablet, Rfl: 0 .  loratadine (CLARITIN) 10 MG tablet, Take 10 mg by mouth daily., Disp: , Rfl:  .  Magnesium Oxide 420 MG TABS, Take 840 mg by mouth daily., Disp: , Rfl:  .  metolazone (ZAROXOLYN)  2.5 MG tablet, Take 1 tablet (2.5 mg total) by mouth once a week. Monday, Disp: 12 tablet, Rfl: 0 .  Multiple Vitamin (MULTIVITAMIN) tablet, Take 1 tablet by mouth daily., Disp: , Rfl:  .  omeprazole (PRILOSEC) 40 MG capsule, Take 40 mg by mouth daily., Disp: , Rfl:  .  Potassium Chloride ER 20 MEQ TBCR, Take 20 mEq by mouth once a week. Take with Metolazone, Disp: 4 tablet, Rfl: 1 .  potassium chloride SA (K-DUR,KLOR-CON) 20 MEQ tablet, Take 1.5 tablets (30 mEq total) by mouth 2 (two) times daily., Disp: 30 tablet, Rfl: 0 .  rOPINIRole (REQUIP) 0.5 MG tablet, Take 0.5 mg by mouth at bedtime. , Disp: , Rfl:  .  sertraline (ZOLOFT) 100 MG tablet, Take 150 mg by mouth daily., Disp: , Rfl:  .  simvastatin (ZOCOR) 20 MG tablet, Take 20 mg by mouth daily., Disp: , Rfl:  .  spironolactone (ALDACTONE) 25 MG tablet, Take 1 tablet (25 mg total) by mouth at bedtime., Disp: 30 tablet, Rfl: 3 .  torsemide (DEMADEX) 20 MG tablet, Take 1 tablet (20 mg total) by mouth 2 (two) times daily., Disp: 120 tablet, Rfl: 3 Allergies  Allergen Reactions  . Codeine Itching  . Gabapentin Itching  . Haldol [Haloperidol Lactate] Other (See Comments)    Dizziness, hallucinations  . Levofloxacin  Other reaction(s): Other (See Comments) Projectile vomiting   . Lisinopril Itching and Swelling  . Losartan Itching  . Morphine And Related Nausea And Vomiting  . Penicillins Nausea And Vomiting    "Vomiting, upset stomach, Headache" Has patient had a PCN reaction causing immediate rash, facial/tongue/throat swelling, SOB or lightheadedness with hypotension: No Has patient had a PCN reaction causing severe rash involving mucus membranes or skin necrosis: No Has patient had a PCN reaction that required hospitalization: No Has patient had a PCN reaction occurring within the last 10 years: No If all of the above answers are "NO", then may proceed with Cephalosporin use.      Social History   Social History  . Marital  status: Single    Spouse name: N/A  . Number of children: N/A  . Years of education: N/A   Occupational History  . Not on file.   Social History Main Topics  . Smoking status: Never Smoker  . Smokeless tobacco: Never Used  . Alcohol use No  . Drug use: No  . Sexual activity: Not on file   Other Topics Concern  . Not on file   Social History Narrative  . No narrative on file    Physical Exam  Constitutional: She is oriented to person, place, and time.  Cardiovascular: Normal rate and regular rhythm.   Pulmonary/Chest: Effort normal and breath sounds normal. No respiratory distress. She has no wheezes. She has no rales.  Abdominal: Soft. She exhibits no distension.  Musculoskeletal: Normal range of motion. She exhibits no edema.  Neurological: She is alert and oriented to person, place, and time.  Skin: Skin is warm and dry.  Psychiatric: She has a normal mood and affect.        Future Appointments Date Time Provider [place]  08/06/2017 10:30 AM [place] 1 LBRE-CVRES None  08/07/2017 1:00 PM [facility] ECHO 1-BUZZ [facility]  08/07/2017 2:20 PM Andrade, Michelle Pascal, MD [facility] None   BP 132/78 (BP Location: Left Arm, Patient Position: Sitting, Cuff Size: Normal)   Pulse 68   Resp 16   Wt 187 lb 9.6 oz (85.1 kg)   SpO2 97%   BMI 33.23 kg/m  Weight yesterday- 187.5 lb Last visit weight- 186.6 lb CBG- 172 mg/dl  Ms Michelle Andrade was seen at home today and reports feeling well. She denied SOB and headaches but her dizziness persists. She has been taking all he medications and had no complaints to report. Her medications were verified and her pillbox was refilled.   Time spent with patient: 43 minutes  Michelle Andrade, EMT 06/27/17  ACTION: Home visit completed Next visit planned for 2 weeks

## 2017-07-09 ENCOUNTER — Other Ambulatory Visit (HOSPITAL_COMMUNITY): Payer: Self-pay

## 2017-07-09 ENCOUNTER — Other Ambulatory Visit (HOSPITAL_COMMUNITY): Payer: Self-pay | Admitting: Pharmacist

## 2017-07-09 MED ORDER — POTASSIUM CHLORIDE CRYS ER 20 MEQ PO TBCR: 30.0000 meq | EXTENDED_RELEASE_TABLET | Freq: Two times a day (BID) | ORAL | 11 refills | Status: DC

## 2017-07-09 NOTE — Progress Notes (Signed)
Paramedicine Encounter    Patient ID: Michelle Andrade, female    DOB: December 07, 1960, 56 y.o.   MRN: [id number]   Patient Care Team: Henderson Baltimore, [name] as PCP - General (Nurse Practitioner)  Patient Active Problem List   Diagnosis Date Noted  . AKI (acute kidney injury) ([place])   . Type 2 diabetes mellitus with stage 3 chronic kidney disease ([place]) 02/13/2017  . Acute on chronic systolic (congestive) heart failure ([place])   . CKD (chronic kidney disease), stage III ([place])   . Constipated   . CHF (congestive heart failure) ([place]) 02/09/2017  . Pulmonary vascular congestion 02/09/2017  . Hypertension   . Sleep apnea   . PAD (peripheral artery disease) ([place]) 01/12/2014    Current Outpatient Medications:  .  amiodarone (PACERONE) 200 MG tablet, Take 200 mg by mouth daily., Disp: , Rfl:  .  aspirin 81 MG chewable tablet, Chew 81 mg by mouth daily., Disp: , Rfl:  .  Calcium Carbonate-Vitamin D3 (CALCIUM 600/VITAMIN D) 600-400 MG-UNIT TABS, Take 1 tablet by mouth 2 (two) times daily., Disp: , Rfl:  .  carvedilol (COREG) 25 MG tablet, Take 12.5 mg by mouth 2 (two) times daily with a meal., Disp: , Rfl:  .  ferrous sulfate 325 (65 FE) MG tablet, Take 325 mg by mouth 3 (three) times daily. , Disp: , Rfl:  .  glipiZIDE (GLUCOTROL) 5 MG tablet, Take 2.5 mg by mouth daily before breakfast., Disp: , Rfl:  .  hydrALAZINE (APRESOLINE) 50 MG tablet, Take 50 mg by mouth 3 (three) times daily., Disp: , Rfl:  .  Investigational - Study Medication, Take 1 tablet by mouth 2 (two) times daily. Study name: Galactic Heart Failure Study Additional study details: Omecamtiv Mecarbil or Placebo, Disp: 1 each, Rfl: PRN .  isosorbide mononitrate (IMDUR) 60 MG 24 hr tablet, Take 1 tablet (60 mg total) by mouth daily., Disp: 30 tablet, Rfl: 0 .  loratadine (CLARITIN) 10 MG tablet, Take 10 mg by mouth daily., Disp: , Rfl:  .  Magnesium Oxide 420 MG TABS, Take 840 mg by mouth daily., Disp: , Rfl:  .  metolazone (ZAROXOLYN)  2.5 MG tablet, Take 1 tablet (2.5 mg total) by mouth once a week. Monday, Disp: 12 tablet, Rfl: 0 .  Multiple Vitamin (MULTIVITAMIN) tablet, Take 1 tablet by mouth daily., Disp: , Rfl:  .  omeprazole (PRILOSEC) 40 MG capsule, Take 40 mg by mouth daily., Disp: , Rfl:  .  Potassium Chloride ER 20 MEQ TBCR, Take 20 mEq by mouth once a week. Take with Metolazone, Disp: 4 tablet, Rfl: 1 .  potassium chloride SA (K-DUR,KLOR-CON) 20 MEQ tablet, Take 1.5 tablets (30 mEq total) by mouth 2 (two) times daily., Disp: 30 tablet, Rfl: 0 .  rOPINIRole (REQUIP) 0.5 MG tablet, Take 0.5 mg by mouth at bedtime. , Disp: , Rfl:  .  sertraline (ZOLOFT) 100 MG tablet, Take 150 mg by mouth daily., Disp: , Rfl:  .  simvastatin (ZOCOR) 20 MG tablet, Take 20 mg by mouth daily., Disp: , Rfl:  .  spironolactone (ALDACTONE) 25 MG tablet, Take 1 tablet (25 mg total) by mouth at bedtime., Disp: 30 tablet, Rfl: 3 .  torsemide (DEMADEX) 20 MG tablet, Take 1 tablet (20 mg total) by mouth 2 (two) times daily., Disp: 120 tablet, Rfl: 3 Allergies  Allergen Reactions  . Codeine Itching  . Gabapentin Itching  . Haldol [Haloperidol Lactate] Other (See Comments)    Dizziness, hallucinations  . Levofloxacin  Other reaction(s): Other (See Comments) Projectile vomiting   . Lisinopril Itching and Swelling  . Losartan Itching  . Morphine And Related Nausea And Vomiting  . Penicillins Nausea And Vomiting    "Vomiting, upset stomach, Headache" Has patient had a PCN reaction causing immediate rash, facial/tongue/throat swelling, SOB or lightheadedness with hypotension: No Has patient had a PCN reaction causing severe rash involving mucus membranes or skin necrosis: No Has patient had a PCN reaction that required hospitalization: No Has patient had a PCN reaction occurring within the last 10 years: No If all of the above answers are "NO", then may proceed with Cephalosporin use.       Social History   Socioeconomic History  .  Marital status: Single    Spouse name: Not on file  . Number of children: Not on file  . Years of education: Not on file  . Highest education level: Not on file  Social Needs  . Financial resource strain: Not on file  . Food insecurity - worry: Not on file  . Food insecurity - inability: Not on file  . Transportation needs - medical: Not on file  . Transportation needs - non-medical: Not on file  Occupational History  . Not on file  Tobacco Use  . Smoking status: Never Smoker  . Smokeless tobacco: Never Used  Substance and Sexual Activity  . Alcohol use: No  . Drug use: No  . Sexual activity: Not on file  Other Topics Concern  . Not on file  Social History Narrative  . Not on file    Physical Exam  Constitutional: She is oriented to person, place, and time.  Cardiovascular: Normal rate and regular rhythm.  Pulmonary/Chest: Effort normal and breath sounds normal. No respiratory distress. She has no wheezes. She has no rales.  Abdominal: Soft. She exhibits no distension.  Musculoskeletal: Normal range of motion. She exhibits no edema.  Neurological: She is alert and oriented to person, place, and time.  Skin: Skin is warm and dry.  Psychiatric: She has a normal mood and affect.        Future Appointments  Date Time Provider [place]  08/06/2017 10:30 AM [place] 1 LBRE-CVRES None  08/07/2017  1:00 PM [place] ECHO 1-BUZZ [facility]-ECHOLAB [facility]  08/07/2017  2:20 PM Bensimhon, Shaune Pascal, MD MC-HVSC None    BP 110/68 (BP Location: Left Arm, Patient Position: Sitting, Cuff Size: Normal)   Pulse 82   Resp 18   Wt 187 lb 3.2 oz (84.9 kg)   SpO2 98%   BMI 33.16 kg/m   Weight yesterday- 186 lb Last visit weight- 187.6 lb CBG- 200 mg/dl  Michelle Andrade was seen at home today and reported feeling well. She denied SOB, headache or new dizziness. She spilled one of her full medication boxes and was not able to identify the pills. Instead she put them into a  bag and took her medications out of the bottles. I corrected this issue, verified her medications and refilled her pillboxes. I contacted Doroteo Bradford at the clinic to request a new potassium prescription be sent to the [place].   Time spent with patient: 90 minutes   [name], EMT 07/09/17  ACTION: Home visit completed Next visit planned for 2 weeks

## 2017-07-17 ENCOUNTER — Telehealth (HOSPITAL_COMMUNITY): Payer: Self-pay

## 2017-07-17 NOTE — Telephone Encounter (Signed)
I called Mr Michelle Andrade today at her request via text message. She stated she has been feeling ill since Friday with a cold. She stated the congestion had moved from her sinuses to her chest and she had a productive cough with brown sputum. She had not contacted her PCP today so I encouraged her to do so.

## 2017-07-18 ENCOUNTER — Other Ambulatory Visit (HOSPITAL_COMMUNITY): Payer: Self-pay

## 2017-07-18 ENCOUNTER — Other Ambulatory Visit (HOSPITAL_COMMUNITY): Payer: Self-pay | Admitting: Pharmacist

## 2017-07-18 MED ORDER — POTASSIUM CHLORIDE CRYS ER 20 MEQ PO TBCR: 30.0000 meq | EXTENDED_RELEASE_TABLET | Freq: Two times a day (BID) | ORAL | 11 refills | Status: DC

## 2017-07-22 ENCOUNTER — Telehealth: Payer: Self-pay | Admitting: *Deleted

## 2017-07-22 ENCOUNTER — Other Ambulatory Visit (HOSPITAL_COMMUNITY): Payer: Self-pay

## 2017-07-22 NOTE — Progress Notes (Signed)
Paramedicine Encounter    Patient ID: Michelle Andrade, female    DOB: [birth year], 56 y.o.   MRN: [id number]   Patient Care Team: Henderson Baltimore, [name] as PCP - General (Nurse Practitioner)  Patient Active Problem List   Diagnosis Date Noted  . AKI (acute kidney injury) ([place])   . Type 2 diabetes mellitus with stage 3 chronic kidney disease ([place]) 02/13/2017  . Acute on chronic systolic (congestive) heart failure ([place])   . CKD (chronic kidney disease), stage III ([place])   . Constipated   . CHF (congestive heart failure) ([place]) 02/09/2017  . Pulmonary vascular congestion 02/09/2017  . Hypertension   . Sleep apnea   . PAD (peripheral artery disease) ([place]) 01/12/2014    Current Outpatient Medications:  .  amiodarone (PACERONE) 200 MG tablet, Take 200 mg by mouth daily., Disp: , Rfl:  .  aspirin 81 MG chewable tablet, Chew 81 mg by mouth daily., Disp: , Rfl:  .  Calcium Carbonate-Vitamin D3 (CALCIUM 600/VITAMIN D) 600-400 MG-UNIT TABS, Take 1 tablet by mouth 2 (two) times daily., Disp: , Rfl:  .  carvedilol (COREG) 25 MG tablet, Take 12.5 mg by mouth 2 (two) times daily with a meal., Disp: , Rfl:  .  ferrous sulfate 325 (65 FE) MG tablet, Take 325 mg by mouth 3 (three) times daily. , Disp: , Rfl:  .  glipiZIDE (GLUCOTROL) 5 MG tablet, Take 2.5 mg by mouth daily before breakfast., Disp: , Rfl:  .  hydrALAZINE (APRESOLINE) 50 MG tablet, Take 50 mg by mouth 3 (three) times daily., Disp: , Rfl:  .  Investigational - Study Medication, Take 1 tablet by mouth 2 (two) times daily. Study name: Galactic Heart Failure Study Additional study details: Omecamtiv Mecarbil or Placebo, Disp: 1 each, Rfl: PRN .  isosorbide mononitrate (IMDUR) 60 MG 24 hr tablet, Take 1 tablet (60 mg total) by mouth daily., Disp: 30 tablet, Rfl: 0 .  loratadine (CLARITIN) 10 MG tablet, Take 10 mg by mouth daily., Disp: , Rfl:  .  Magnesium Oxide 420 MG TABS, Take 840 mg by mouth daily., Disp: , Rfl:  .  metolazone (ZAROXOLYN)  2.5 MG tablet, Take 1 tablet (2.5 mg total) by mouth once a week. Monday, Disp: 12 tablet, Rfl: 0 .  Multiple Vitamin (MULTIVITAMIN) tablet, Take 1 tablet by mouth daily., Disp: , Rfl:  .  omeprazole (PRILOSEC) 40 MG capsule, Take 40 mg by mouth daily., Disp: , Rfl:  .  potassium chloride SA (K-DUR,KLOR-CON) 20 MEQ tablet, Take 1.5 tablets (30 mEq total) 2 (two) times daily by mouth. Take an additional 1 tablet (20 meq) weekly with metolazone, Disp: 94 tablet, Rfl: 11 .  rOPINIRole (REQUIP) 0.5 MG tablet, Take 0.5 mg by mouth at bedtime. , Disp: , Rfl:  .  sertraline (ZOLOFT) 100 MG tablet, Take 150 mg by mouth daily., Disp: , Rfl:  .  simvastatin (ZOCOR) 20 MG tablet, Take 20 mg by mouth daily., Disp: , Rfl:  .  spironolactone (ALDACTONE) 25 MG tablet, Take 1 tablet (25 mg total) by mouth at bedtime., Disp: 30 tablet, Rfl: 3 .  torsemide (DEMADEX) 20 MG tablet, Take 1 tablet (20 mg total) by mouth 2 (two) times daily., Disp: 120 tablet, Rfl: 3 Allergies  Allergen Reactions  . Codeine Itching  . Gabapentin Itching  . Haldol [Haloperidol Lactate] Other (See Comments)    Dizziness, hallucinations  . Levofloxacin     Other reaction(s): Other (See Comments) Projectile vomiting   .  Lisinopril Itching and Swelling  . Losartan Itching  . Morphine And Related Nausea And Vomiting  . Penicillins Nausea And Vomiting    "Vomiting, upset stomach, Headache" Has patient had a PCN reaction causing immediate rash, facial/tongue/throat swelling, SOB or lightheadedness with hypotension: No Has patient had a PCN reaction causing severe rash involving mucus membranes or skin necrosis: No Has patient had a PCN reaction that required hospitalization: No Has patient had a PCN reaction occurring within the last 10 years: No If all of the above answers are "NO", then may proceed with Cephalosporin use.       Social History   Socioeconomic History  . Marital status: Single    Spouse name: Not on file  .  Number of children: Not on file  . Years of education: Not on file  . Highest education level: Not on file  Social Needs  . Financial resource strain: Not on file  . Food insecurity - worry: Not on file  . Food insecurity - inability: Not on file  . Transportation needs - medical: Not on file  . Transportation needs - non-medical: Not on file  Occupational History  . Not on file  Tobacco Use  . Smoking status: Never Smoker  . Smokeless tobacco: Never Used  Substance and Sexual Activity  . Alcohol use: No  . Drug use: No  . Sexual activity: Not on file  Other Topics Concern  . Not on file  Social History Narrative  . Not on file    Physical Exam  Constitutional: She is oriented to person, place, and time.  Cardiovascular: Normal rate and regular rhythm.  Pulmonary/Chest: Effort normal and breath sounds normal. No respiratory distress. She has no wheezes. She has no rales.  Abdominal: Soft.  Musculoskeletal: Normal range of motion. She exhibits no edema.  Neurological: She is alert and oriented to person, place, and time.  Skin: Skin is warm and dry.  Psychiatric: She has a normal mood and affect.        Future Appointments  Date Time Provider [place]  08/06/2017 10:30 AM [place] 1 LBRE-CVRES None  08/07/2017  1:00 PM [place] ECHO 1-BUZZ [facility]ECHOLAB [facility]  08/07/2017  2:20 PM Bensimhon, Shaune Pascal, MD MC-HVSC None    BP 130/84 (BP Location: Left Arm, Patient Position: Sitting, Cuff Size: Normal)   Pulse 72   Resp 16   Wt 186 lb (84.4 kg)   SpO2 99%   BMI 32.95 kg/m   Weight yesterday- 186 lb Last visit weight- 187 lb  [name] was seen at home today and reported feeling well. She denied SOB and headache. She stated her dizziness was persistent but not worse than before. She was feeling depressed and expressed to me that she did not feel comfortable going to a Thanksgiving dinner because she "doesn't have anything to add" because she  "doesn't have any children or a boyfriend."  She reports taking her medications as prescribed. She said she has been working on this issue with her psychiatrist but it seems the holiday season has increased these feelings. Her medications were verified and her pillbox was refilled.   Time spent with patient: 43 minutes  [name], EMT 07/23/17  ACTION: Home visit completed Next visit planned for 2 weeks

## 2017-07-22 NOTE — Telephone Encounter (Signed)
RESEARCH TELEPHONE ENCOUNTER  Patient ID: Michelle Andrade  DOB: 1961/01/13  [name] was notified by phone that her enrollment in the GALACTIC-HF study is considered a deviation from the protocol eligibility criteria.  [name] is in the [place] study with [employer] and enrolled on Sept. 15th, 2015 with a five year follow up.  The Study Coordinator was notified on Nov 14th, 2018 that the subject can continue in the GALACTIC-HF Study.  Subject advised to always contact the [facility] if questions/concerns about clinical trials.  Subject verbalized understanding.

## 2017-08-06 ENCOUNTER — Encounter: Payer: Medicare Other | Admitting: *Deleted

## 2017-08-06 VITALS — BP 109/63 | HR 67 | Resp 16 | Wt 187.0 lb

## 2017-08-06 DIAGNOSIS — Z006 Encounter for examination for normal comparison and control in clinical research program: Secondary | ICD-10-CM

## 2017-08-06 NOTE — Progress Notes (Signed)
RESEARCH ENCOUNTER  Patient ID: Michelle Andrade  DOB: [birth year]  Michelle Andrade presented to the [facility] for the Week 24 visit of the [facility].  No signs/symptoms of ACS since the last visit. Subject compliant with IP, IP returned, and additional IP dispensed.  In addition, surveys completed and labs drawn.    Patient will follow up with [facility] in 12 weeks.

## 2017-08-07 ENCOUNTER — Other Ambulatory Visit (HOSPITAL_COMMUNITY): Payer: Self-pay

## 2017-08-07 ENCOUNTER — Ambulatory Visit (HOSPITAL_BASED_OUTPATIENT_CLINIC_OR_DEPARTMENT_OTHER)
Admission: RE | Admit: 2017-08-07 | Discharge: 2017-08-07 | Disposition: A | Discharge status: Home / Self Care | Payer: Medicare Other | Source: Ambulatory Visit | Attending: Internal Medicine | Admitting: Internal Medicine

## 2017-08-07 ENCOUNTER — Ambulatory Visit (HOSPITAL_COMMUNITY)
Admission: RE | Admit: 2017-08-07 | Discharge: 2017-08-07 | Disposition: A | Discharge status: Home / Self Care | Payer: Medicare Other | Source: Ambulatory Visit | Attending: Internal Medicine | Admitting: Internal Medicine

## 2017-08-07 ENCOUNTER — Other Ambulatory Visit: Payer: Self-pay

## 2017-08-07 VITALS — BP 130/80 | HR 70 | Wt 188.4 lb

## 2017-08-07 DIAGNOSIS — Z7982 Long term (current) use of aspirin: Secondary | ICD-10-CM | POA: Insufficient documentation

## 2017-08-07 DIAGNOSIS — G4733 Obstructive sleep apnea (adult) (pediatric): Secondary | ICD-10-CM | POA: Insufficient documentation

## 2017-08-07 DIAGNOSIS — Z79899 Other long term (current) drug therapy: Secondary | ICD-10-CM | POA: Insufficient documentation

## 2017-08-07 DIAGNOSIS — Z8249 Family history of ischemic heart disease and other diseases of the circulatory system: Secondary | ICD-10-CM | POA: Insufficient documentation

## 2017-08-07 DIAGNOSIS — Z880 Allergy status to penicillin: Secondary | ICD-10-CM | POA: Insufficient documentation

## 2017-08-07 DIAGNOSIS — Z7984 Long term (current) use of oral hypoglycemic drugs: Secondary | ICD-10-CM | POA: Diagnosis not present

## 2017-08-07 DIAGNOSIS — Z888 Allergy status to other drugs, medicaments and biological substances status: Secondary | ICD-10-CM | POA: Insufficient documentation

## 2017-08-07 DIAGNOSIS — Z95810 Presence of automatic (implantable) cardiac defibrillator: Secondary | ICD-10-CM | POA: Insufficient documentation

## 2017-08-07 DIAGNOSIS — F329 Major depressive disorder, single episode, unspecified: Secondary | ICD-10-CM | POA: Insufficient documentation

## 2017-08-07 DIAGNOSIS — K589 Irritable bowel syndrome without diarrhea: Secondary | ICD-10-CM | POA: Diagnosis not present

## 2017-08-07 DIAGNOSIS — E1122 Type 2 diabetes mellitus with diabetic chronic kidney disease: Secondary | ICD-10-CM | POA: Diagnosis not present

## 2017-08-07 DIAGNOSIS — I252 Old myocardial infarction: Secondary | ICD-10-CM | POA: Diagnosis not present

## 2017-08-07 DIAGNOSIS — D649 Anemia, unspecified: Secondary | ICD-10-CM | POA: Diagnosis not present

## 2017-08-07 DIAGNOSIS — Z881 Allergy status to other antibiotic agents status: Secondary | ICD-10-CM | POA: Insufficient documentation

## 2017-08-07 DIAGNOSIS — I471 Supraventricular tachycardia: Secondary | ICD-10-CM | POA: Insufficient documentation

## 2017-08-07 DIAGNOSIS — I2510 Atherosclerotic heart disease of native coronary artery without angina pectoris: Secondary | ICD-10-CM | POA: Insufficient documentation

## 2017-08-07 DIAGNOSIS — I428 Other cardiomyopathies: Secondary | ICD-10-CM | POA: Insufficient documentation

## 2017-08-07 DIAGNOSIS — I5022 Chronic systolic (congestive) heart failure: Secondary | ICD-10-CM | POA: Diagnosis present

## 2017-08-07 DIAGNOSIS — Z885 Allergy status to narcotic agent status: Secondary | ICD-10-CM | POA: Diagnosis not present

## 2017-08-07 DIAGNOSIS — K317 Polyp of stomach and duodenum: Secondary | ICD-10-CM | POA: Insufficient documentation

## 2017-08-07 DIAGNOSIS — K219 Gastro-esophageal reflux disease without esophagitis: Secondary | ICD-10-CM | POA: Insufficient documentation

## 2017-08-07 DIAGNOSIS — I130 Hypertensive heart and chronic kidney disease with heart failure and stage 1 through stage 4 chronic kidney disease, or unspecified chronic kidney disease: Secondary | ICD-10-CM | POA: Diagnosis not present

## 2017-08-07 DIAGNOSIS — N183 Chronic kidney disease, stage 3 (moderate): Secondary | ICD-10-CM | POA: Insufficient documentation

## 2017-08-07 DIAGNOSIS — E7800 Pure hypercholesterolemia, unspecified: Secondary | ICD-10-CM | POA: Diagnosis not present

## 2017-08-07 NOTE — Patient Instructions (Signed)
Your physician recommends that you schedule a follow-up appointment in: 3 months.  

## 2017-08-07 NOTE — Progress Notes (Signed)
Paramedicine Encounter   Patient ID: Michelle Andrade , female,   DOB: 21-Jan-1961,56 y.o.,  MRN: [id number]   Michelle Andrade was seen in the clinic today with the providers. Following her echocardiogram no medications changes were found to be necessary. Her medications were verified and her pillboxes were filled for the next two weeks.    Time spent with patient: 32 minutes   Michelle Andrade, EMT 08/07/2017   ACTION: Home visit completed Next visit planned for 2 weeks

## 2017-08-07 NOTE — Progress Notes (Signed)
Advanced Heart Failure Clinic Note   Primary Cardiologist: Dr. Haroldine Laws  Nephrology: Dr Valaria [facility].   HPI:  Michelle Andrade is a 56 year old female with a past medical history of HTN, chronic systolic CHF s/p BI V ICD ([employer]),atrial tachycardia (2017), NICM (normal cors in 2014), CKD stage III, HLD, DM, OSA w/CPAP, and anemia.  She was admitted 12/26/16-12/27/16 with volume overload at [facility]. Echo that admission with EF 25-30%. She was discharged on Spiro 25mg  daily, Coreg 25mg  BID,and hydralazine 12.5mg  daily. Discharge weight was 177 pounds.   Her baseline creatinine appears to be 1.4-1.6 when reviewing her records from [facility]. Her last office visit with her Cardiologist Dr. Otho Perl was in January 2018 . He noted angioedema with ACE-I and cough with ARB.   She presented to the ED on 02/09/17 with SOB and abdominal bloating. Started on IV Lasix. RHC done and showed Fick output/index 4.5/2.5. PVR 3.8 WU. Discharge weight was 176 pounds.  Went to the ED on 03/15/17 with dizziness, orthostasis. She had taken an extra Lasix that day.   Today she returns for HF follow up. Overall feeling fine. Denies SOB/PND/Orthopnea. Dizzy every time she stands up.  Appetite ok. No fever or chills. Weight at home 183-184 pounds. Taking all medications. Followed by Paramedicine.   ECHO today ~25-30%   %  Review of systems complete and found to be negative unless listed in HPI.    Past Medical History:  Diagnosis Date  . Arthritis 02/08/2017  . 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 CAD (coronary artery disease) 09/01/2013  . CHF (congestive heart failure) ([place]) 06/07/2015  . CKD (chronic kidney disease), stage III 12/09/2013  . Depression 07/27/2013  . Diabetes mellitus without complication ([place]) 44/11/4740  . GERD (gastroesophageal reflux disease) 07/27/2013  . Gout 12/29/2013  . Heart valve disorder 06/03/2013  . High  cholesterol 11/10/2013  . Hypertension 11/10/2013  . IBS (irritable bowel syndrome) 09/27/2014  . Myocardial infarct ([place])   . Sleep apnea 10/13/2013  . Vascular disorder 01/12/2014    Current Outpatient Medications  Medication Sig Dispense Refill  . amiodarone (PACERONE) 200 MG tablet Take 200 mg by mouth daily.    [name] aspirin 81 MG chewable tablet Chew 81 mg by mouth daily.    . Calcium Carbonate-Vitamin D3 (CALCIUM 600/VITAMIN D) 600-400 MG-UNIT TABS Take 1 tablet by mouth 2 (two) times daily.    . carvedilol (COREG) 25 MG tablet Take 12.5 mg by mouth 2 (two) times daily with a meal.    . ferrous sulfate 325 (65 FE) MG tablet Take 325 mg by mouth 3 (three) times daily.     [name] glipiZIDE (GLUCOTROL) 5 MG tablet Take 2.5 mg by mouth daily before breakfast.    . hydrALAZINE (APRESOLINE) 50 MG tablet Take 50 mg by mouth 3 (three) times daily.    . Investigational - Study Medication Take 1 tablet by mouth 2 (two) times daily. Study name: Galactic Heart Failure Study Additional study details: Omecamtiv Mecarbil or Placebo 1 each PRN  . isosorbide mononitrate (IMDUR) 60 MG 24 hr tablet Take 1 tablet (60 mg total) by mouth daily. 30 tablet 0  . loratadine (CLARITIN) 10 MG tablet Take 10 mg by mouth daily.    . Magnesium Oxide 420 MG TABS Take 840 mg by mouth daily.    . metolazone (ZAROXOLYN) 2.5 MG tablet Take 1 tablet (2.5 mg total) by mouth once a week. Monday 12  tablet 0  . Multiple Vitamin (MULTIVITAMIN) tablet Take 1 tablet by mouth daily.    [name] omeprazole (PRILOSEC) 40 MG capsule Take 40 mg by mouth daily.    . potassium chloride SA (K-DUR,KLOR-CON) 20 MEQ tablet Take 1.5 tablets (30 mEq total) 2 (two) times daily by mouth. Take an additional 1 tablet (20 meq) weekly with metolazone 94 tablet 11  . rOPINIRole (REQUIP) 0.5 MG tablet Take 0.5 mg by mouth at bedtime.     . sertraline (ZOLOFT) 100 MG tablet Take 150 mg by mouth daily.    . simvastatin (ZOCOR) 20 MG tablet Take 20 mg by mouth  daily.    [name] spironolactone (ALDACTONE) 25 MG tablet Take 1 tablet (25 mg total) by mouth at bedtime. 30 tablet 3  . torsemide (DEMADEX) 20 MG tablet Take 1 tablet (20 mg total) by mouth 2 (two) times daily. 120 tablet 3   No current facility-administered medications for this encounter.    Allergies  Allergen Reactions  . Codeine Itching  . Gabapentin Itching  . Haldol [Haloperidol Lactate] Other (See Comments)    Dizziness, hallucinations  . Levofloxacin     Other reaction(s): Other (See Comments) Projectile vomiting   . Lisinopril Itching and Swelling  . Losartan Itching  . Morphine And Related Nausea And Vomiting  . Penicillins Nausea And Vomiting    "Vomiting, upset stomach, Headache" Has patient had a PCN reaction causing immediate rash, facial/tongue/throat swelling, SOB or lightheadedness with hypotension: No Has patient had a PCN reaction causing severe rash involving mucus membranes or skin necrosis: No Has patient had a PCN reaction that required hospitalization: No Has patient had a PCN reaction occurring within the last 10 years: No If all of the above answers are "NO", then may proceed with Cephalosporin use.    Social History   Socioeconomic History  . Marital status: Single    Spouse name: Not on file  . Number of children: Not on file  . Years of education: Not on file  . Highest education level: Not on file  Social Needs  . Financial resource strain: Not on file  . Food insecurity - worry: Not on file  . Food insecurity - inability: Not on file  . Transportation needs - medical: Not on file  . Transportation needs - non-medical: Not on file  Occupational History  . Not on file  Tobacco Use  . Smoking status: Never Smoker  . Smokeless tobacco: Never Used  Substance and Sexual Activity  . Alcohol use: No  . Drug use: No  . Sexual activity: Not on file  Other Topics Concern  . Not on file  Social History Narrative  . Not on file   Family History    Problem Relation Age of Onset  . Hypertension Mother    Vitals:   08/07/17 1444  BP: 130/80  Pulse: 70  SpO2: 98%  Weight: 188 lb 6.4 oz (85.5 kg)   Wt Readings from Last 3 Encounters:  08/07/17 188 lb 6.4 oz (85.5 kg)  08/06/17 187 lb (84.8 kg)  07/22/17 186 lb (84.4 kg)    PHYSICAL EXAM: General:  Well appearing. No resp difficulty HEENT: normal Neck: supple. no JVD. Carotids 2+ bilat; no bruits. No lymphadenopathy or thryomegaly appreciated. Cor: PMI laterally displaced. Regular rate & rhythm. No rubs, gallops or murmurs. Lungs: clear Abdomen: obese soft, nontender, nondistended. No hepatosplenomegaly. No bruits or masses. Good bowel sounds. Extremities: no cyanosis, clubbing, rash, edema Neuro: alert & orientedx3,  cranial nerves grossly intact. moves all 4 extremities w/o difficulty. Affect pleasant  ASSESSMENT & PLAN: 1. Chronic systolic CHF: NICM, normal cors in 2014, likely related to HTN vs. Noncompliance vs. Tachy - medicated with history of atrial tach.  - Echo 02/2017 EF 20%, moderate MR, moderate TR. PA pressure 80 mm Hg.   -ECHO 08/07/2017 EF 20-25% -NYHA II-III symptoms. No room to up titrate due to dizziness.  - Volume status stable. Continue torsemide 40 mg BID.  - Continue metolazone 2.5 mg weekly.  - Continue Spiro 25 mg qhs. - Continue isosorbide 60 mg daily - Continue hydralazine 25 mg TID - Continue Coreg 12.5 mg BID.     2. CKD stage III: Baseline creatinine 1.3-1.6.  -Check BMET today.   3. BiV ICD  St Jude .   4. HTN - Well controlled.  Meds as above.    5. History of atrial tachycardia - On Amiodarone 200 mg daily - Recent LFTs stable.    6. Gastric polyp  - had EGD on 04/02/17 found to have gastric polyp that was biopsied.  - [facility]   Follow up in 3 months   Michelle Grinder, NP 08/07/17   Patient seen and examined with Michelle Grinder, NP. We discussed all aspects of the encounter. I agree with the assessment and plan as stated  above.   Echo reviewed personally. EF 25-30% (stable).  She feels much better. NYHA II. Following with Paramedicine - who accompanied her to visit today. Volume status looks good. Having episodes of lightheadedness so will not increase meds currently. Reviewed HF teaching with her and Paramedicine team. Can f/u in 3 months.   Michelle Bickers, MD  10:20 PM

## 2017-08-07 NOTE — Progress Notes (Signed)
  Echocardiogram 2D Echocardiogram has been performed.  Jennette Dubin 08/07/2017, 2:34 PM

## 2017-08-16 ENCOUNTER — Telehealth (HOSPITAL_COMMUNITY): Payer: Self-pay | Admitting: Pharmacist

## 2017-08-16 NOTE — Telephone Encounter (Signed)
Received a message from [place] (paramedic) that Ms. Manor's spironolactone was d/c'd by her nephrologist 2/2 SCr increasing from 2.32 to 3.17. Will adjust on medication list.   [name]. [name], PharmD, BCPS, CPP Clinical Pharmacist Pager: [phone] Phone: [phone] 08/16/2017 12:13 PM

## 2017-08-21 ENCOUNTER — Other Ambulatory Visit (HOSPITAL_COMMUNITY): Payer: Self-pay

## 2017-08-21 NOTE — Progress Notes (Signed)
Paramedicine Encounter    Patient ID: Michelle Andrade, female    DOB: 28-Mar-1961, 56 y.o.   MRN: [id number]   Patient Care Team: Henderson Baltimore, [name] as PCP - General (Nurse Practitioner)  Patient Active Problem List   Diagnosis Date Noted  . AKI (acute kidney injury) ([place])   . Type 2 diabetes mellitus with stage 3 chronic kidney disease ([place]) 02/13/2017  . Acute on chronic systolic (congestive) heart failure ([place])   . CKD (chronic kidney disease), stage III ([place])   . Constipated   . CHF (congestive heart failure) ([place]) 02/09/2017  . Pulmonary vascular congestion 02/09/2017  . Hypertension   . Sleep apnea   . PAD (peripheral artery disease) ([place]) 01/12/2014    Current Outpatient Medications:  .  amiodarone (PACERONE) 200 MG tablet, Take 200 mg by mouth daily., Disp: , Rfl:  .  aspirin 81 MG chewable tablet, Chew 81 mg by mouth daily., Disp: , Rfl:  .  Calcium Carbonate-Vitamin D3 (CALCIUM 600/VITAMIN D) 600-400 MG-UNIT TABS, Take 1 tablet by mouth 2 (two) times daily., Disp: , Rfl:  .  carvedilol (COREG) 25 MG tablet, Take 12.5 mg by mouth 2 (two) times daily with a meal., Disp: , Rfl:  .  ferrous sulfate 325 (65 FE) MG tablet, Take 325 mg by mouth 3 (three) times daily. , Disp: , Rfl:  .  glipiZIDE (GLUCOTROL) 5 MG tablet, Take 2.5 mg by mouth daily before breakfast., Disp: , Rfl:  .  hydrALAZINE (APRESOLINE) 50 MG tablet, Take 50 mg by mouth 3 (three) times daily., Disp: , Rfl:  .  Investigational - Study Medication, Take 1 tablet by mouth 2 (two) times daily. Study name: Galactic Heart Failure Study Additional study details: Omecamtiv Mecarbil or Placebo, Disp: 1 each, Rfl: PRN .  isosorbide mononitrate (IMDUR) 60 MG 24 hr tablet, Take 1 tablet (60 mg total) by mouth daily., Disp: 30 tablet, Rfl: 0 .  loratadine (CLARITIN) 10 MG tablet, Take 10 mg by mouth daily., Disp: , Rfl:  .  Magnesium Oxide 420 MG TABS, Take 840 mg by mouth daily., Disp: , Rfl:  .  metolazone (ZAROXOLYN)  2.5 MG tablet, Take 1 tablet (2.5 mg total) by mouth once a week. Monday, Disp: 12 tablet, Rfl: 0 .  Multiple Vitamin (MULTIVITAMIN) tablet, Take 1 tablet by mouth daily., Disp: , Rfl:  .  omeprazole (PRILOSEC) 40 MG capsule, Take 40 mg by mouth daily., Disp: , Rfl:  .  potassium chloride SA (K-DUR,KLOR-CON) 20 MEQ tablet, Take 1.5 tablets (30 mEq total) 2 (two) times daily by mouth. Take an additional 1 tablet (20 meq) weekly with metolazone, Disp: 94 tablet, Rfl: 11 .  rOPINIRole (REQUIP) 0.5 MG tablet, Take 0.5 mg by mouth at bedtime. , Disp: , Rfl:  .  sertraline (ZOLOFT) 100 MG tablet, Take 150 mg by mouth daily., Disp: , Rfl:  .  simvastatin (ZOCOR) 20 MG tablet, Take 20 mg by mouth daily., Disp: , Rfl:  .  torsemide (DEMADEX) 20 MG tablet, Take 1 tablet (20 mg total) by mouth 2 (two) times daily., Disp: 120 tablet, Rfl: 3 Allergies  Allergen Reactions  . Codeine Itching  . Gabapentin Itching  . Haldol [Haloperidol Lactate] Other (See Comments)    Dizziness, hallucinations  . Levofloxacin     Other reaction(s): Other (See Comments) Projectile vomiting   . Lisinopril Itching and Swelling  . Losartan Itching  . Morphine And Related Nausea And Vomiting  . Penicillins Nausea And Vomiting    "  Vomiting, upset stomach, Headache" Has patient had a PCN reaction causing immediate rash, facial/tongue/throat swelling, SOB or lightheadedness with hypotension: No Has patient had a PCN reaction causing severe rash involving mucus membranes or skin necrosis: No Has patient had a PCN reaction that required hospitalization: No Has patient had a PCN reaction occurring within the last 10 years: No If all of the above answers are "NO", then may proceed with Cephalosporin use.       Social History   Socioeconomic History  . Marital status: Single    Spouse name: Not on file  . Number of children: Not on file  . Years of education: Not on file  . Highest education level: Not on file  Social  Needs  . Financial resource strain: Not on file  . Food insecurity - worry: Not on file  . Food insecurity - inability: Not on file  . Transportation needs - medical: Not on file  . Transportation needs - non-medical: Not on file  Occupational History  . Not on file  Tobacco Use  . Smoking status: Never Smoker  . Smokeless tobacco: Never Used  Substance and Sexual Activity  . Alcohol use: No  . Drug use: No  . Sexual activity: Not on file  Other Topics Concern  . Not on file  Social History Narrative  . Not on file    Physical Exam  Constitutional: She is oriented to person, place, and time.  Cardiovascular: Normal rate and regular rhythm.  Pulmonary/Chest: Effort normal and breath sounds normal. No respiratory distress. She has no wheezes. She has no rales.  Abdominal: Soft. She exhibits no distension.  Musculoskeletal: Normal range of motion. She exhibits no edema.  Neurological: She is alert and oriented to person, place, and time.  Skin: Skin is warm and dry.  Psychiatric: She has a normal mood and affect.        Future Appointments  Date Time Provider [place]  10/22/2017 10:00 AM [place] 2 [facility] None  11/05/2017  2:00 PM [facility] PA/NP [facility] None    BP 122/76 (BP Location: Left Arm, Patient Position: Sitting, Cuff Size: Normal)   Pulse 62   Resp 16   Wt 188 lb 12.8 oz (85.6 kg)   SpO2 97%   BMI 33.44 kg/m   Weight yesterday- Did not weigh Last visit weight- 188 lb  [name] was seen at home today and reported feeling well. She was dizzy which is her normal but she denied SOB or headache. She did not weigh yesterday but said she is following a low sodium diet. She Also stated she is eating [employer] packaged dinners. I told her that these frozen meals are loaded with sodium and she should avoid them even though they say "healthy." Her medications were verified and her pillbox was refilled.   Time spent with  patient: 72 minutes  Michelle Andrade, EMT 08/21/17  ACTION: Home visit completed Next visit planned for 2 weeks

## 2017-09-02 ENCOUNTER — Other Ambulatory Visit (HOSPITAL_COMMUNITY): Payer: Self-pay

## 2017-09-02 NOTE — Progress Notes (Signed)
Paramedicine Encounter    Patient ID: Michelle Andrade, female    DOB: Apr 30, 1961, 56 y.o.   MRN: [id number]   Patient Care Team: Henderson Baltimore, [name] as PCP - General (Nurse Practitioner)  Patient Active Problem List   Diagnosis Date Noted  . AKI (acute kidney injury) ([place])   . Type 2 diabetes mellitus with stage 3 chronic kidney disease ([place]) 02/13/2017  . Acute on chronic systolic (congestive) heart failure ([place])   . CKD (chronic kidney disease), stage III ([place])   . Constipated   . CHF (congestive heart failure) ([place]) 02/09/2017  . Pulmonary vascular congestion 02/09/2017  . Hypertension   . Sleep apnea   . PAD (peripheral artery disease) ([place]) 01/12/2014    Current Outpatient Medications:  .  amiodarone (PACERONE) 200 MG tablet, Take 200 mg by mouth daily., Disp: , Rfl:  .  aspirin 81 MG chewable tablet, Chew 81 mg by mouth daily., Disp: , Rfl:  .  Calcium Carbonate-Vitamin D3 (CALCIUM 600/VITAMIN D) 600-400 MG-UNIT TABS, Take 1 tablet by mouth 2 (two) times daily., Disp: , Rfl:  .  carvedilol (COREG) 25 MG tablet, Take 12.5 mg by mouth 2 (two) times daily with a meal., Disp: , Rfl:  .  ferrous sulfate 325 (65 FE) MG tablet, Take 325 mg by mouth 3 (three) times daily. , Disp: , Rfl:  .  glipiZIDE (GLUCOTROL) 5 MG tablet, Take 2.5 mg by mouth daily before breakfast., Disp: , Rfl:  .  hydrALAZINE (APRESOLINE) 50 MG tablet, Take 50 mg by mouth 3 (three) times daily., Disp: , Rfl:  .  Investigational - Study Medication, Take 1 tablet by mouth 2 (two) times daily. Study name: Galactic Heart Failure Study Additional study details: Omecamtiv Mecarbil or Placebo, Disp: 1 each, Rfl: PRN .  isosorbide mononitrate (IMDUR) 60 MG 24 hr tablet, Take 1 tablet (60 mg total) by mouth daily., Disp: 30 tablet, Rfl: 0 .  loratadine (CLARITIN) 10 MG tablet, Take 10 mg by mouth daily., Disp: , Rfl:  .  Magnesium Oxide 420 MG TABS, Take 840 mg by mouth daily., Disp: , Rfl:  .  metolazone (ZAROXOLYN)  2.5 MG tablet, Take 1 tablet (2.5 mg total) by mouth once a week. Monday, Disp: 12 tablet, Rfl: 0 .  Multiple Vitamin (MULTIVITAMIN) tablet, Take 1 tablet by mouth daily., Disp: , Rfl:  .  omeprazole (PRILOSEC) 40 MG capsule, Take 40 mg by mouth daily., Disp: , Rfl:  .  potassium chloride SA (K-DUR,KLOR-CON) 20 MEQ tablet, Take 1.5 tablets (30 mEq total) 2 (two) times daily by mouth. Take an additional 1 tablet (20 meq) weekly with metolazone, Disp: 94 tablet, Rfl: 11 .  rOPINIRole (REQUIP) 0.5 MG tablet, Take 0.5 mg by mouth at bedtime. , Disp: , Rfl:  .  sertraline (ZOLOFT) 100 MG tablet, Take 150 mg by mouth daily., Disp: , Rfl:  .  simvastatin (ZOCOR) 20 MG tablet, Take 20 mg by mouth daily., Disp: , Rfl:  .  torsemide (DEMADEX) 20 MG tablet, Take 1 tablet (20 mg total) by mouth 2 (two) times daily., Disp: 120 tablet, Rfl: 3 Allergies  Allergen Reactions  . Codeine Itching  . Gabapentin Itching  . Haldol [Haloperidol Lactate] Other (See Comments)    Dizziness, hallucinations  . Levofloxacin     Other reaction(s): Other (See Comments) Projectile vomiting   . Lisinopril Itching and Swelling  . Losartan Itching  . Morphine And Related Nausea And Vomiting  . Penicillins Nausea And Vomiting    "  Vomiting, upset stomach, Headache" Has patient had a PCN reaction causing immediate rash, facial/tongue/throat swelling, SOB or lightheadedness with hypotension: No Has patient had a PCN reaction causing severe rash involving mucus membranes or skin necrosis: No Has patient had a PCN reaction that required hospitalization: No Has patient had a PCN reaction occurring within the last 10 years: No If all of the above answers are "NO", then may proceed with Cephalosporin use.       Social History   Socioeconomic History  . Marital status: Single    Spouse name: Not on file  . Number of children: Not on file  . Years of education: Not on file  . Highest education level: Not on file  Social  Needs  . Financial resource strain: Not on file  . Food insecurity - worry: Not on file  . Food insecurity - inability: Not on file  . Transportation needs - medical: Not on file  . Transportation needs - non-medical: Not on file  Occupational History  . Not on file  Tobacco Use  . Smoking status: Never Smoker  . Smokeless tobacco: Never Used  Substance and Sexual Activity  . Alcohol use: No  . Drug use: No  . Sexual activity: Not on file  Other Topics Concern  . Not on file  Social History Narrative  . Not on file    Physical Exam  Constitutional: She is oriented to person, place, and time.  Cardiovascular: Normal rate and regular rhythm.  Pulmonary/Chest: Effort normal and breath sounds normal. No respiratory distress. She has no wheezes. She has no rales.  Abdominal: She exhibits distension.  Musculoskeletal: Normal range of motion. She exhibits no edema.  Neurological: She is alert and oriented to person, place, and time.  Skin: Skin is warm and dry.  Psychiatric: She has a normal mood and affect.        Future Appointments  Date Time Provider [place]  10/22/2017 10:00 AM [place] 2 [facility] None  11/05/2017  2:00 PM [facility] PA/NP [facility] None    BP (!) 160/88 (BP Location: Left Arm, Patient Position: Sitting, Cuff Size: Normal)   Pulse 60   Resp 16   Wt 192 lb (87.1 kg)   SpO2 98%   BMI 34.01 kg/m   Weight yesterday- 190 lb Last visit weight- 188 lb   Ms Castro was seen at home today and reported feeling bloated. She did have some abdominal distention but her lung sounds were clear and equal. We spoke at length about her diet because she is still eating frozen meals and she began to cry and said she did not want to make the effort to eat better because she is "ready to go." She said that she missed her mother who died years ago. She denied SI and said she speaks about this to her psychiatrist regularly so I did not see the  need for immediate intervention. I did ask her to come the the HF support group on Thursday and gave her the relevant information. She said she would be there.   Time spent with patient: 58 minutes  [name], EMT 09/02/17  ACTION: Home visit completed Next visit planned for 1 week

## 2017-09-18 ENCOUNTER — Other Ambulatory Visit (HOSPITAL_COMMUNITY): Payer: Self-pay

## 2017-09-18 NOTE — Progress Notes (Signed)
Paramedicine Encounter    Patient ID: Michelle Andrade, female    DOB: Oct 08, 1960, 57 y.o.   MRN: [id number]   Patient Care Team: Henderson Baltimore, [name] as PCP - General (Nurse Practitioner)  Patient Active Problem List   Diagnosis Date Noted  . AKI (acute kidney injury) ([place])   . Type 2 diabetes mellitus with stage 3 chronic kidney disease ([place]) 02/13/2017  . Acute on chronic systolic (congestive) heart failure ([place])   . CKD (chronic kidney disease), stage III ([place])   . Constipated   . CHF (congestive heart failure) ([place]) 02/09/2017  . Pulmonary vascular congestion 02/09/2017  . Hypertension   . Sleep apnea   . PAD (peripheral artery disease) ([place]) 01/12/2014    Current Outpatient Medications:  .  amiodarone (PACERONE) 200 MG tablet, Take 200 mg by mouth daily., Disp: , Rfl:  .  aspirin 81 MG chewable tablet, Chew 81 mg by mouth daily., Disp: , Rfl:  .  Calcium Carbonate-Vitamin D3 (CALCIUM 600/VITAMIN D) 600-400 MG-UNIT TABS, Take 1 tablet by mouth 2 (two) times daily., Disp: , Rfl:  .  carvedilol (COREG) 25 MG tablet, Take 12.5 mg by mouth 2 (two) times daily with a meal., Disp: , Rfl:  .  ferrous sulfate 325 (65 FE) MG tablet, Take 325 mg by mouth 3 (three) times daily. , Disp: , Rfl:  .  glipiZIDE (GLUCOTROL) 5 MG tablet, Take 2.5 mg by mouth daily before breakfast., Disp: , Rfl:  .  hydrALAZINE (APRESOLINE) 50 MG tablet, Take 50 mg by mouth 3 (three) times daily., Disp: , Rfl:  .  Investigational - Study Medication, Take 1 tablet by mouth 2 (two) times daily. Study name: Galactic Heart Failure Study Additional study details: Omecamtiv Mecarbil or Placebo, Disp: 1 each, Rfl: PRN .  isosorbide mononitrate (IMDUR) 60 MG 24 hr tablet, Take 1 tablet (60 mg total) by mouth daily., Disp: 30 tablet, Rfl: 0 .  loratadine (CLARITIN) 10 MG tablet, Take 10 mg by mouth daily., Disp: , Rfl:  .  Magnesium Oxide 420 MG TABS, Take 840 mg by mouth daily., Disp: , Rfl:  .  metolazone (ZAROXOLYN)  2.5 MG tablet, Take 1 tablet (2.5 mg total) by mouth once a week. Monday, Disp: 12 tablet, Rfl: 0 .  Multiple Vitamin (MULTIVITAMIN) tablet, Take 1 tablet by mouth daily., Disp: , Rfl:  .  omeprazole (PRILOSEC) 40 MG capsule, Take 40 mg by mouth daily., Disp: , Rfl:  .  potassium chloride SA (K-DUR,KLOR-CON) 20 MEQ tablet, Take 1.5 tablets (30 mEq total) 2 (two) times daily by mouth. Take an additional 1 tablet (20 meq) weekly with metolazone, Disp: 94 tablet, Rfl: 11 .  rOPINIRole (REQUIP) 0.5 MG tablet, Take 0.5 mg by mouth at bedtime. , Disp: , Rfl:  .  sertraline (ZOLOFT) 100 MG tablet, Take 150 mg by mouth daily., Disp: , Rfl:  .  simvastatin (ZOCOR) 20 MG tablet, Take 20 mg by mouth daily., Disp: , Rfl:  .  torsemide (DEMADEX) 20 MG tablet, Take 1 tablet (20 mg total) by mouth 2 (two) times daily., Disp: 120 tablet, Rfl: 3 Allergies  Allergen Reactions  . Codeine Itching  . Gabapentin Itching  . Haldol [Haloperidol Lactate] Other (See Comments)    Dizziness, hallucinations  . Levofloxacin     Other reaction(s): Other (See Comments) Projectile vomiting   . Lisinopril Itching and Swelling  . Losartan Itching  . Morphine And Related Nausea And Vomiting  . Penicillins Nausea And Vomiting    "  Vomiting, upset stomach, Headache" Has patient had a PCN reaction causing immediate rash, facial/tongue/throat swelling, SOB or lightheadedness with hypotension: No Has patient had a PCN reaction causing severe rash involving mucus membranes or skin necrosis: No Has patient had a PCN reaction that required hospitalization: No Has patient had a PCN reaction occurring within the last 10 years: No If all of the above answers are "NO", then may proceed with Cephalosporin use.       Social History   Socioeconomic History  . Marital status: Single    Spouse name: Not on file  . Number of children: Not on file  . Years of education: Not on file  . Highest education level: Not on file  Social  Needs  . Financial resource strain: Not on file  . Food insecurity - worry: Not on file  . Food insecurity - inability: Not on file  . Transportation needs - medical: Not on file  . Transportation needs - non-medical: Not on file  Occupational History  . Not on file  Tobacco Use  . Smoking status: Never Smoker  . Smokeless tobacco: Never Used  Substance and Sexual Activity  . Alcohol use: No  . Drug use: No  . Sexual activity: Not on file  Other Topics Concern  . Not on file  Social History Narrative  . Not on file    Physical Exam  Constitutional: She is oriented to person, place, and time.  Cardiovascular: Normal rate and regular rhythm.  Pulmonary/Chest: Effort normal and breath sounds normal. No respiratory distress. She has no wheezes. She has no rales.  Abdominal: Soft.  Musculoskeletal: Normal range of motion. She exhibits no edema.  Neurological: She is alert and oriented to person, place, and time.  Skin: Skin is warm and dry.  Psychiatric: She has a normal mood and affect.        Future Appointments  Date Time Provider [place]  10/22/2017 10:00 AM [place] 2 [facility] None  11/05/2017  2:00 PM [facility] PA/NP [facility] None    BP 114/62 (BP Location: Left Arm, Patient Position: Sitting, Cuff Size: Large)   Pulse 80   Resp 20   Wt 190 lb (86.2 kg)   SpO2 98%   BMI 33.66 kg/m   Weight yesterday- 189 lb Last visit weight- 192 lb  Mr Elgersma was seen at home today and reported feeling generally well. She denied SOB, headache, or increased dizziness. She reported being compliant with her medications though she had accidentally dropped her pillbox earlier in the week and was taking them out of a plastic bag. Her medications were verified and her pillbox was refilled. She has ordered amiodarone and metolazone which should be here by next week but she has enough to last until then.   Time spent with patient: 41 minutes  [name], EMT 09/18/17  ACTION: Home visit completed Next visit planned for 1 week

## 2017-09-24 ENCOUNTER — Telehealth (HOSPITAL_COMMUNITY): Payer: Self-pay

## 2017-09-24 NOTE — Telephone Encounter (Signed)
I called Michelle Andrade to schedule an appointment. She stated she has not received her mail order medications yet but would let me know if they come in today. We agreed to meet tomorrow at 10:00.

## 2017-09-26 ENCOUNTER — Other Ambulatory Visit (HOSPITAL_COMMUNITY): Payer: Self-pay

## 2017-09-26 NOTE — Progress Notes (Signed)
Paramedicine Encounter    Patient ID: Michelle Andrade, female    DOB: [birth year], 57 y.o.   MRN: [id number]   Patient Care Team: Henderson Baltimore, [name] as PCP - General (Nurse Practitioner)  Patient Active Problem List   Diagnosis Date Noted  . AKI (acute kidney injury) ([place])   . Type 2 diabetes mellitus with stage 3 chronic kidney disease ([place]) 02/13/2017  . Acute on chronic systolic (congestive) heart failure ([place])   . CKD (chronic kidney disease), stage III ([place])   . Constipated   . CHF (congestive heart failure) ([place]) 02/09/2017  . Pulmonary vascular congestion 02/09/2017  . Hypertension   . Sleep apnea   . PAD (peripheral artery disease) ([place]) 01/12/2014    Current Outpatient Medications:  .  amiodarone (PACERONE) 200 MG tablet, Take 200 mg by mouth daily., Disp: , Rfl:  .  aspirin 81 MG chewable tablet, Chew 81 mg by mouth daily., Disp: , Rfl:  .  Calcium Carbonate-Vitamin D3 (CALCIUM 600/VITAMIN D) 600-400 MG-UNIT TABS, Take 1 tablet by mouth 2 (two) times daily., Disp: , Rfl:  .  carvedilol (COREG) 25 MG tablet, Take 12.5 mg by mouth 2 (two) times daily with a meal., Disp: , Rfl:  .  ferrous sulfate 325 (65 FE) MG tablet, Take 325 mg by mouth 3 (three) times daily. , Disp: , Rfl:  .  glipiZIDE (GLUCOTROL) 5 MG tablet, Take 2.5 mg by mouth daily before breakfast., Disp: , Rfl:  .  hydrALAZINE (APRESOLINE) 50 MG tablet, Take 50 mg by mouth 3 (three) times daily., Disp: , Rfl:  .  Investigational - Study Medication, Take 1 tablet by mouth 2 (two) times daily. Study name: Galactic Heart Failure Study Additional study details: Omecamtiv Mecarbil or Placebo, Disp: 1 each, Rfl: PRN .  isosorbide mononitrate (IMDUR) 60 MG 24 hr tablet, Take 1 tablet (60 mg total) by mouth daily., Disp: 30 tablet, Rfl: 0 .  loratadine (CLARITIN) 10 MG tablet, Take 10 mg by mouth daily., Disp: , Rfl:  .  Magnesium Oxide 420 MG TABS, Take 840 mg by mouth daily., Disp: , Rfl:  .  metolazone (ZAROXOLYN)  2.5 MG tablet, Take 1 tablet (2.5 mg total) by mouth once a week. Monday, Disp: 12 tablet, Rfl: 0 .  Multiple Vitamin (MULTIVITAMIN) tablet, Take 1 tablet by mouth daily., Disp: , Rfl:  .  omeprazole (PRILOSEC) 40 MG capsule, Take 40 mg by mouth daily., Disp: , Rfl:  .  potassium chloride SA (K-DUR,KLOR-CON) 20 MEQ tablet, Take 1.5 tablets (30 mEq total) 2 (two) times daily by mouth. Take an additional 1 tablet (20 meq) weekly with metolazone, Disp: 94 tablet, Rfl: 11 .  rOPINIRole (REQUIP) 0.5 MG tablet, Take 0.5 mg by mouth at bedtime. , Disp: , Rfl:  .  sertraline (ZOLOFT) 100 MG tablet, Take 150 mg by mouth daily., Disp: , Rfl:  .  simvastatin (ZOCOR) 20 MG tablet, Take 20 mg by mouth daily., Disp: , Rfl:  .  torsemide (DEMADEX) 20 MG tablet, Take 1 tablet (20 mg total) by mouth 2 (two) times daily., Disp: 120 tablet, Rfl: 3 Allergies  Allergen Reactions  . Codeine Itching  . Gabapentin Itching  . Haldol [Haloperidol Lactate] Other (See Comments)    Dizziness, hallucinations  . Levofloxacin     Other reaction(s): Other (See Comments) Projectile vomiting   . Lisinopril Itching and Swelling  . Losartan Itching  . Morphine And Related Nausea And Vomiting  . Penicillins Nausea And Vomiting    "  Vomiting, upset stomach, Headache" Has patient had a PCN reaction causing immediate rash, facial/tongue/throat swelling, SOB or lightheadedness with hypotension: No Has patient had a PCN reaction causing severe rash involving mucus membranes or skin necrosis: No Has patient had a PCN reaction that required hospitalization: No Has patient had a PCN reaction occurring within the last 10 years: No If all of the above answers are "NO", then may proceed with Cephalosporin use.       Social History   Socioeconomic History  . Marital status: Single    Spouse name: Not on file  . Number of children: Not on file  . Years of education: Not on file  . Highest education level: Not on file  Social  Needs  . Financial resource strain: Not on file  . Food insecurity - worry: Not on file  . Food insecurity - inability: Not on file  . Transportation needs - medical: Not on file  . Transportation needs - non-medical: Not on file  Occupational History  . Not on file  Tobacco Use  . Smoking status: Never Smoker  . Smokeless tobacco: Never Used  Substance and Sexual Activity  . Alcohol use: No  . Drug use: No  . Sexual activity: Not on file  Other Topics Concern  . Not on file  Social History Narrative  . Not on file    Physical Exam  Constitutional: She is oriented to person, place, and time.  Neck: No JVD present.  Cardiovascular: Normal rate and regular rhythm.  Pulmonary/Chest: Effort normal and breath sounds normal. No respiratory distress. She has no wheezes. She has no rales.  Abdominal: Soft. She exhibits no distension.  Musculoskeletal: Normal range of motion.  Neurological: She is alert and oriented to person, place, and time.  Skin: Skin is warm and dry.  Psychiatric: She has a normal mood and affect.        Future Appointments  Date Time Provider [place]  10/22/2017 10:00 AM [place] 2 [facility] None  11/05/2017  2:00 PM [facility] PA/NP [facility] None    BP 118/62 (BP Location: Left Arm, Patient Position: Sitting, Cuff Size: Large)   Pulse 70   Resp 18   Wt 192 lb (87.1 kg)   SpO2 98%   BMI 34.01 kg/m   Weight yesterday- 192 lb Last visit weight- 190 lb CBG- 181 mg/dL  Michelle Andrade was seen at home today and reported feeling well. She denied SOB, headache, or worsening dizziness. She reported being compliant with her medications and no missed doses were noted. Her medications were verified and her pillbox was refilled.   Time spent with patient: 49 minutes  Michelle Andrade, EMT 09/26/17  ACTION: Home visit completed Next visit planned for 2 weeks

## 2017-10-01 ENCOUNTER — Telehealth (HOSPITAL_COMMUNITY): Payer: Self-pay | Admitting: Pharmacist

## 2017-10-01 NOTE — Telephone Encounter (Signed)
Updated KCl dosing per nephrologist recommendation.   [name]. [name], PharmD, BCPS, CPP Clinical Pharmacist Phone: [phone] 10/01/2017 4:42 PM

## 2017-10-10 ENCOUNTER — Other Ambulatory Visit (HOSPITAL_COMMUNITY): Payer: Self-pay

## 2017-10-10 NOTE — Progress Notes (Signed)
Paramedicine Encounter    Patient ID: Kateryn Marasigan, female    DOB: October 08, 1960, 57 y.o.   MRN: [id number]   Patient Care Team: Henderson Baltimore, [name] as PCP - General (Nurse Practitioner)  Patient Active Problem List   Diagnosis Date Noted  . AKI (acute kidney injury) ([place])   . Type 2 diabetes mellitus with stage 3 chronic kidney disease ([place]) 02/13/2017  . Acute on chronic systolic (congestive) heart failure ([place])   . CKD (chronic kidney disease), stage III ([place])   . Constipated   . CHF (congestive heart failure) ([place]) 02/09/2017  . Pulmonary vascular congestion 02/09/2017  . Hypertension   . Sleep apnea   . PAD (peripheral artery disease) ([place]) 01/12/2014    Current Outpatient Medications:  .  amiodarone (PACERONE) 200 MG tablet, Take 200 mg by mouth daily., Disp: , Rfl:  .  aspirin 81 MG chewable tablet, Chew 81 mg by mouth daily., Disp: , Rfl:  .  Calcium Carbonate-Vitamin D3 (CALCIUM 600/VITAMIN D) 600-400 MG-UNIT TABS, Take 1 tablet by mouth 2 (two) times daily., Disp: , Rfl:  .  carvedilol (COREG) 25 MG tablet, Take 12.5 mg by mouth 2 (two) times daily with a meal., Disp: , Rfl:  .  ferrous sulfate 325 (65 FE) MG tablet, Take 325 mg by mouth 3 (three) times daily. , Disp: , Rfl:  .  glipiZIDE (GLUCOTROL) 5 MG tablet, Take 2.5 mg by mouth daily before breakfast., Disp: , Rfl:  .  hydrALAZINE (APRESOLINE) 50 MG tablet, Take 50 mg by mouth 3 (three) times daily., Disp: , Rfl:  .  Investigational - Study Medication, Take 1 tablet by mouth 2 (two) times daily. Study name: Galactic Heart Failure Study Additional study details: Omecamtiv Mecarbil or Placebo, Disp: 1 each, Rfl: PRN .  isosorbide mononitrate (IMDUR) 60 MG 24 hr tablet, Take 1 tablet (60 mg total) by mouth daily., Disp: 30 tablet, Rfl: 0 .  loratadine (CLARITIN) 10 MG tablet, Take 10 mg by mouth daily., Disp: , Rfl:  .  Magnesium Oxide 420 MG TABS, Take 840 mg by mouth daily., Disp: , Rfl:  .  metolazone (ZAROXOLYN)  2.5 MG tablet, Take 1 tablet (2.5 mg total) by mouth once a week. Monday, Disp: 12 tablet, Rfl: 0 .  Multiple Vitamin (MULTIVITAMIN) tablet, Take 1 tablet by mouth daily., Disp: , Rfl:  .  omeprazole (PRILOSEC) 40 MG capsule, Take 40 mg by mouth daily., Disp: , Rfl:  .  potassium chloride (K-DUR,KLOR-CON) 10 MEQ tablet, Take 40 mEq by mouth 2 (two) times daily., Disp: , Rfl:  .  rOPINIRole (REQUIP) 0.5 MG tablet, Take 0.5 mg by mouth at bedtime. , Disp: , Rfl:  .  simvastatin (ZOCOR) 20 MG tablet, Take 20 mg by mouth daily., Disp: , Rfl:  .  torsemide (DEMADEX) 20 MG tablet, Take 1 tablet (20 mg total) by mouth 2 (two) times daily., Disp: 120 tablet, Rfl: 3 .  sertraline (ZOLOFT) 100 MG tablet, Take 150 mg by mouth daily., Disp: , Rfl:  Allergies  Allergen Reactions  . Codeine Itching  . Gabapentin Itching  . Haldol [Haloperidol Lactate] Other (See Comments)    Dizziness, hallucinations  . Levofloxacin     Other reaction(s): Other (See Comments) Projectile vomiting   . Lisinopril Itching and Swelling  . Losartan Itching  . Morphine And Related Nausea And Vomiting  . Penicillins Nausea And Vomiting    "Vomiting, upset stomach, Headache" Has patient had a PCN reaction causing immediate  rash, facial/tongue/throat swelling, SOB or lightheadedness with hypotension: No Has patient had a PCN reaction causing severe rash involving mucus membranes or skin necrosis: No Has patient had a PCN reaction that required hospitalization: No Has patient had a PCN reaction occurring within the last 10 years: No If all of the above answers are "NO", then may proceed with Cephalosporin use.       Social History   Socioeconomic History  . Marital status: Single    Spouse name: Not on file  . Number of children: Not on file  . Years of education: Not on file  . Highest education level: Not on file  Social Needs  . Financial resource strain: Not on file  . Food insecurity - worry: Not on file  .  Food insecurity - inability: Not on file  . Transportation needs - medical: Not on file  . Transportation needs - non-medical: Not on file  Occupational History  . Not on file  Tobacco Use  . Smoking status: Never Smoker  . Smokeless tobacco: Never Used  Substance and Sexual Activity  . Alcohol use: No  . Drug use: No  . Sexual activity: Not on file  Other Topics Concern  . Not on file  Social History Narrative  . Not on file    Physical Exam  Constitutional: She is oriented to person, place, and time.  Neck: JVD present.  Cardiovascular: Normal rate and regular rhythm.  Pulmonary/Chest: Effort normal and breath sounds normal. No respiratory distress. She has no wheezes. She has no rales.  Abdominal: Soft.  Musculoskeletal: Normal range of motion. She exhibits no edema.  Neurological: She is alert and oriented to person, place, and time.  Skin: Skin is warm and dry.  Psychiatric: She has a normal mood and affect.        Future Appointments  Date Time Provider [place]  10/22/2017 10:00 AM [place] [facility] None  11/05/2017  2:00 PM [facility] PA/NP [facility] None    BP 112/64 (BP Location: Left Arm, Patient Position: Sitting, Cuff Size: Large)   Pulse 74   Resp 16   SpO2 98%   Weight yesterday- 192 lb Last visit weight- 192 lb **She did not weigh today**  Ms Chiem was seen at home today and reported feeling bloated. She stated she feels like her belly is swollen though it did not appear distended or feel tight. She exhibited mild JVD but no lower extremity edema. She also advised she had an episode of sudden severe chest pain at the site of her defibrillator last week. She stated she did not think to call anyone because it "didn't knock her down." I explained that this may have been her defibrillator firing and if it happens again she should call 911 or the clinic immediately. She agreed and then we discussed if she has ben transmitting  her defibrillator weekly. She said she had never done anything to transmit the information besides set up the box. I showed her how to use the transmit feature and told her that if she has another episode, she should also transmit while she is calling for help. She was agreeable. Her medications were verified and her pillbox was refilled.  Time spent with patient: 52 minutes  [name], EMT 10/10/17  ACTION: Home visit completed Next visit planned for 1 week

## 2017-10-22 ENCOUNTER — Encounter: Payer: Medicare Other | Admitting: *Deleted

## 2017-10-22 VITALS — BP 115/66 | HR 67 | Wt 196.2 lb

## 2017-10-22 DIAGNOSIS — Z006 Encounter for examination for normal comparison and control in clinical research program: Secondary | ICD-10-CM

## 2017-10-22 NOTE — Progress Notes (Signed)
RESEARCH ENCOUNTER  Patient ID: Michelle Andrade  DOB: 07/26/1961  Michelle Andrade presented to the [facility] for the Week 36 visit of the [facility].  No signs/symptoms of ACS since the last visit. Subject compliant with IP, IP returned, and additional IP dispensed.  Week 36 research activities completed without event.  Patient will follow up with [facility] in May.

## 2017-10-25 ENCOUNTER — Telehealth (HOSPITAL_COMMUNITY): Payer: Self-pay | Admitting: Pharmacist

## 2017-10-25 ENCOUNTER — Other Ambulatory Visit (HOSPITAL_COMMUNITY): Payer: Self-pay

## 2017-10-25 NOTE — Progress Notes (Signed)
Paramedicine Encounter    Patient ID: Constanza Mincy, female    DOB: June 19, 1961, 57 y.o.   MRN: [id number]   Patient Care Team: Henderson Baltimore, [name] as PCP - General (Nurse Practitioner)  Patient Active Problem List   Diagnosis Date Noted  . AKI (acute kidney injury) ([place])   . Type 2 diabetes mellitus with stage 3 chronic kidney disease ([place]) 02/13/2017  . Acute on chronic systolic (congestive) heart failure ([place])   . CKD (chronic kidney disease), stage III ([place])   . Constipated   . CHF (congestive heart failure) ([place]) 02/09/2017  . Pulmonary vascular congestion 02/09/2017  . Hypertension   . Sleep apnea   . PAD (peripheral artery disease) ([place]) 01/12/2014    Current Outpatient Medications:  .  amiodarone (PACERONE) 200 MG tablet, Take 200 mg by mouth daily., Disp: , Rfl:  .  aspirin 81 MG chewable tablet, Chew 81 mg by mouth daily., Disp: , Rfl:  .  Calcium Carbonate-Vitamin D3 (CALCIUM 600/VITAMIN D) 600-400 MG-UNIT TABS, Take 1 tablet by mouth 2 (two) times daily., Disp: , Rfl:  .  carvedilol (COREG) 25 MG tablet, Take 12.5 mg by mouth 2 (two) times daily with a meal., Disp: , Rfl:  .  ferrous sulfate 325 (65 FE) MG tablet, Take 325 mg by mouth 3 (three) times daily. , Disp: , Rfl:  .  glipiZIDE (GLUCOTROL) 5 MG tablet, Take 2.5 mg by mouth daily before breakfast., Disp: , Rfl:  .  hydrALAZINE (APRESOLINE) 50 MG tablet, Take 50 mg by mouth 3 (three) times daily., Disp: , Rfl:  .  Investigational - Study Medication, Take 1 tablet by mouth 2 (two) times daily. Study name: Galactic Heart Failure Study Additional study details: Omecamtiv Mecarbil or Placebo, Disp: 1 each, Rfl: PRN .  isosorbide mononitrate (IMDUR) 60 MG 24 hr tablet, Take 1 tablet (60 mg total) by mouth daily., Disp: 30 tablet, Rfl: 0 .  loratadine (CLARITIN) 10 MG tablet, Take 10 mg by mouth daily., Disp: , Rfl:  .  Magnesium Oxide 420 MG TABS, Take 840 mg by mouth daily., Disp: , Rfl:  .  metolazone (ZAROXOLYN)  2.5 MG tablet, Take 1 tablet (2.5 mg total) by mouth once a week. Monday, Disp: 12 tablet, Rfl: 0 .  Multiple Vitamin (MULTIVITAMIN) tablet, Take 1 tablet by mouth daily., Disp: , Rfl:  .  omeprazole (PRILOSEC) 40 MG capsule, Take 40 mg by mouth daily., Disp: , Rfl:  .  potassium chloride (K-DUR,KLOR-CON) 10 MEQ tablet, Take 40 mEq by mouth 2 (two) times daily., Disp: , Rfl:  .  rOPINIRole (REQUIP) 0.5 MG tablet, Take 0.5 mg by mouth at bedtime. , Disp: , Rfl:  .  sertraline (ZOLOFT) 100 MG tablet, Take 150 mg by mouth daily., Disp: , Rfl:  .  simvastatin (ZOCOR) 20 MG tablet, Take 20 mg by mouth daily., Disp: , Rfl:  .  torsemide (DEMADEX) 20 MG tablet, Take 1 tablet (20 mg total) by mouth 2 (two) times daily., Disp: 120 tablet, Rfl: 3 Allergies  Allergen Reactions  . Codeine Itching  . Gabapentin Itching  . Haldol [Haloperidol Lactate] Other (See Comments)    Dizziness, hallucinations  . Levofloxacin     Other reaction(s): Other (See Comments) Projectile vomiting   . Lisinopril Itching and Swelling  . Losartan Itching  . Morphine And Related Nausea And Vomiting  . Penicillins Nausea And Vomiting    "Vomiting, upset stomach, Headache" Has patient had a PCN reaction causing immediate  rash, facial/tongue/throat swelling, SOB or lightheadedness with hypotension: No Has patient had a PCN reaction causing severe rash involving mucus membranes or skin necrosis: No Has patient had a PCN reaction that required hospitalization: No Has patient had a PCN reaction occurring within the last 10 years: No If all of the above answers are "NO", then may proceed with Cephalosporin use.       Social History   Socioeconomic History  . Marital status: Single    Spouse name: Not on file  . Number of children: Not on file  . Years of education: Not on file  . Highest education level: Not on file  Social Needs  . Financial resource strain: Not on file  . Food insecurity - worry: Not on file  .  Food insecurity - inability: Not on file  . Transportation needs - medical: Not on file  . Transportation needs - non-medical: Not on file  Occupational History  . Not on file  Tobacco Use  . Smoking status: Never Smoker  . Smokeless tobacco: Never Used  Substance and Sexual Activity  . Alcohol use: No  . Drug use: No  . Sexual activity: Not on file  Other Topics Concern  . Not on file  Social History Narrative  . Not on file    Physical Exam      Future Appointments  Date Time Provider [place]  11/05/2017  2:00 PM [facility] PA/NP [facility] None  01/20/2018 10:00 AM [place] [facility] None    BP 124/78 (BP Location: Left Arm, Patient Position: Sitting, Cuff Size: Large)   Pulse 78   Resp 16   Wt 195 lb 3.2 oz (88.5 kg)   SpO2 97%   BMI 34.58 kg/m   Weight yesterday- 193 lb Last visit weight- 192 lb CBG- 223 mg/dL  Ms Ciocca was seen at home today and reported feeling generally well. She denied SOB and headaches and said her dizziness and orthopnea were no worse than normal. We discussed her recent medication changes. She told me that her nephrologist, Dr. Biagio Quint at [facility], put her back on 12.5 mg of spironolactone and said she has progressed to stage III renal failure. Her medications were verified and her pillbox was refilled. I have sent Doroteo Bradford a message in Epic to make the necessary changes.   Time spent with patient: 54 minutes  [name], EMT 10/25/17  ACTION: Home visit completed Next visit planned for 2 weeks

## 2017-10-25 NOTE — Telephone Encounter (Signed)
Received message from [place] (paramedicine) that per nephrologist, patient has been restarted on spironolactone 12.5 mg daily with hypokalemia. Will add back to med list.   Doroteo Bradford K. [name], PharmD, BCPS, CPP Clinical Pharmacist Phone: [phone] 10/25/2017 5:09 PM

## 2017-11-05 ENCOUNTER — Encounter (HOSPITAL_COMMUNITY): Payer: Medicare Other

## 2017-11-06 ENCOUNTER — Telehealth (HOSPITAL_COMMUNITY): Payer: Self-pay

## 2017-11-06 NOTE — Telephone Encounter (Signed)
I called Michelle Andrade to schedule an appointment. She stated she would be available at 10:30 on Wednesday.

## 2017-11-07 ENCOUNTER — Other Ambulatory Visit (HOSPITAL_COMMUNITY): Payer: Self-pay

## 2017-11-07 NOTE — Progress Notes (Signed)
Paramedicine Encounter    Patient ID: Michelle Andrade, female    DOB: 10-06-1960, 57 y.o.   MRN: [id number]   Patient Care Team: Henderson Baltimore, [name] as PCP - General (Nurse Practitioner)  Patient Active Problem List   Diagnosis Date Noted  . AKI (acute kidney injury) ([place])   . Type 2 diabetes mellitus with stage 3 chronic kidney disease ([place]) 02/13/2017  . Acute on chronic systolic (congestive) heart failure ([place])   . CKD (chronic kidney disease), stage III ([place])   . Constipated   . CHF (congestive heart failure) ([place]) 02/09/2017  . Pulmonary vascular congestion 02/09/2017  . Hypertension   . Sleep apnea   . PAD (peripheral artery disease) ([place]) 01/12/2014    Current Outpatient Medications:  .  amiodarone (PACERONE) 200 MG tablet, Take 200 mg by mouth daily., Disp: , Rfl:  .  aspirin 81 MG chewable tablet, Chew 81 mg by mouth daily., Disp: , Rfl:  .  Calcium Carbonate-Vitamin D3 (CALCIUM 600/VITAMIN D) 600-400 MG-UNIT TABS, Take 1 tablet by mouth 2 (two) times daily., Disp: , Rfl:  .  carvedilol (COREG) 25 MG tablet, Take 12.5 mg by mouth 2 (two) times daily with a meal., Disp: , Rfl:  .  ferrous sulfate 325 (65 FE) MG tablet, Take 325 mg by mouth 3 (three) times daily. , Disp: , Rfl:  .  glipiZIDE (GLUCOTROL) 5 MG tablet, Take 2.5 mg by mouth daily before breakfast., Disp: , Rfl:  .  hydrALAZINE (APRESOLINE) 50 MG tablet, Take 50 mg by mouth 3 (three) times daily., Disp: , Rfl:  .  isosorbide mononitrate (IMDUR) 60 MG 24 hr tablet, Take 1 tablet (60 mg total) by mouth daily., Disp: 30 tablet, Rfl: 0 .  loratadine (CLARITIN) 10 MG tablet, Take 10 mg by mouth daily., Disp: , Rfl:  .  Magnesium Oxide 420 MG TABS, Take 840 mg by mouth daily., Disp: , Rfl:  .  metolazone (ZAROXOLYN) 2.5 MG tablet, Take 1 tablet (2.5 mg total) by mouth once a week. Monday, Disp: 12 tablet, Rfl: 0 .  Multiple Vitamin (MULTIVITAMIN) tablet, Take 1 tablet by mouth daily., Disp: , Rfl:  .  omeprazole  (PRILOSEC) 40 MG capsule, Take 40 mg by mouth daily., Disp: , Rfl:  .  potassium chloride (K-DUR,KLOR-CON) 10 MEQ tablet, Take 40 mEq by mouth 2 (two) times daily., Disp: , Rfl:  .  rOPINIRole (REQUIP) 0.5 MG tablet, Take 0.5 mg by mouth at bedtime. , Disp: , Rfl:  .  sertraline (ZOLOFT) 100 MG tablet, Take 150 mg by mouth daily., Disp: , Rfl:  .  simvastatin (ZOCOR) 20 MG tablet, Take 20 mg by mouth daily., Disp: , Rfl:  .  spironolactone (ALDACTONE) 25 MG tablet, Take 12.5 mg by mouth daily., Disp: , Rfl:  .  torsemide (DEMADEX) 20 MG tablet, Take 1 tablet (20 mg total) by mouth 2 (two) times daily., Disp: 120 tablet, Rfl: 3 .  Investigational - Study Medication, Take 1 tablet by mouth 2 (two) times daily. Study name: Galactic Heart Failure Study Additional study details: Omecamtiv Mecarbil or Placebo, Disp: 1 each, Rfl: PRN Allergies  Allergen Reactions  . Codeine Itching  . Gabapentin Itching  . Haldol [Haloperidol Lactate] Other (See Comments)    Dizziness, hallucinations  . Levofloxacin     Other reaction(s): Other (See Comments) Projectile vomiting   . Lisinopril Itching and Swelling  . Losartan Itching  . Morphine And Related Nausea And Vomiting  . Penicillins Nausea  And Vomiting    "Vomiting, upset stomach, Headache" Has patient had a PCN reaction causing immediate rash, facial/tongue/throat swelling, SOB or lightheadedness with hypotension: No Has patient had a PCN reaction causing severe rash involving mucus membranes or skin necrosis: No Has patient had a PCN reaction that required hospitalization: No Has patient had a PCN reaction occurring within the last 10 years: No If all of the above answers are "NO", then may proceed with Cephalosporin use.       Social History   Socioeconomic History  . Marital status: Single    Spouse name: Not on file  . Number of children: Not on file  . Years of education: Not on file  . Highest education level: Not on file  Social  Needs  . Financial resource strain: Not on file  . Food insecurity - worry: Not on file  . Food insecurity - inability: Not on file  . Transportation needs - medical: Not on file  . Transportation needs - non-medical: Not on file  Occupational History  . Not on file  Tobacco Use  . Smoking status: Never Smoker  . Smokeless tobacco: Never Used  Substance and Sexual Activity  . Alcohol use: No  . Drug use: No  . Sexual activity: Not on file  Other Topics Concern  . Not on file  Social History Narrative  . Not on file    Physical Exam  Constitutional: She is oriented to person, place, and time.  Cardiovascular: Normal rate and regular rhythm.  Pulmonary/Chest: Effort normal and breath sounds normal. No respiratory distress. She has no wheezes. She has no rales.  Musculoskeletal: Normal range of motion. She exhibits no edema.  Neurological: She is alert and oriented to person, place, and time.  Skin: Skin is warm and dry.  Psychiatric: She has a normal mood and affect.        Future Appointments  Date Time Provider [place]  11/12/2017  3:00 PM [facility] PA/NP [facility] None  01/20/2018 10:00 AM [place] [facility] None    BP 118/68 (BP Location: Left Arm, Patient Position: Sitting, Cuff Size: Large)   Pulse 66   Resp 16   Wt 195 lb 12.8 oz (88.8 kg)   SpO2 98%   BMI 34.68 kg/m   Weight yesterday- 195 lb Last visit weight- 195 lb CBG- 137 mg/dL  Michelle Andrade was seen at the clinic today following the HF support group. She reported feeling well and has been compliant with her medications. She did not have the investigational study drug with her, so I was not able to place that in her pillbox. All other medications were verified and her pillbox was refilled.   Time spent with patient: 35 minutes  [name], EMT 11/07/17  ACTION: Next visit planned for 2 weeks

## 2017-11-12 ENCOUNTER — Ambulatory Visit (HOSPITAL_COMMUNITY)
Admission: RE | Admit: 2017-11-12 | Discharge: 2017-11-12 | Disposition: A | Discharge status: Home / Self Care | Payer: Medicare Other | Source: Ambulatory Visit | Attending: Cardiology | Admitting: Cardiology

## 2017-11-12 ENCOUNTER — Encounter (HOSPITAL_COMMUNITY): Payer: Self-pay

## 2017-11-12 VITALS — BP 132/78 | HR 71 | Wt 198.4 lb

## 2017-11-12 DIAGNOSIS — M109 Gout, unspecified: Secondary | ICD-10-CM | POA: Diagnosis not present

## 2017-11-12 DIAGNOSIS — I471 Supraventricular tachycardia: Secondary | ICD-10-CM | POA: Insufficient documentation

## 2017-11-12 DIAGNOSIS — Z888 Allergy status to other drugs, medicaments and biological substances status: Secondary | ICD-10-CM | POA: Insufficient documentation

## 2017-11-12 DIAGNOSIS — I429 Cardiomyopathy, unspecified: Secondary | ICD-10-CM | POA: Insufficient documentation

## 2017-11-12 DIAGNOSIS — Z880 Allergy status to penicillin: Secondary | ICD-10-CM | POA: Diagnosis not present

## 2017-11-12 DIAGNOSIS — Z7982 Long term (current) use of aspirin: Secondary | ICD-10-CM | POA: Diagnosis not present

## 2017-11-12 DIAGNOSIS — I10 Essential (primary) hypertension: Secondary | ICD-10-CM | POA: Diagnosis not present

## 2017-11-12 DIAGNOSIS — I428 Other cardiomyopathies: Secondary | ICD-10-CM | POA: Diagnosis not present

## 2017-11-12 DIAGNOSIS — I2510 Atherosclerotic heart disease of native coronary artery without angina pectoris: Secondary | ICD-10-CM | POA: Diagnosis not present

## 2017-11-12 DIAGNOSIS — Z885 Allergy status to narcotic agent status: Secondary | ICD-10-CM | POA: Insufficient documentation

## 2017-11-12 DIAGNOSIS — K317 Polyp of stomach and duodenum: Secondary | ICD-10-CM | POA: Diagnosis not present

## 2017-11-12 DIAGNOSIS — N183 Chronic kidney disease, stage 3 unspecified: Secondary | ICD-10-CM

## 2017-11-12 DIAGNOSIS — I5022 Chronic systolic (congestive) heart failure: Secondary | ICD-10-CM | POA: Diagnosis not present

## 2017-11-12 DIAGNOSIS — I130 Hypertensive heart and chronic kidney disease with heart failure and stage 1 through stage 4 chronic kidney disease, or unspecified chronic kidney disease: Secondary | ICD-10-CM | POA: Insufficient documentation

## 2017-11-12 DIAGNOSIS — E1122 Type 2 diabetes mellitus with diabetic chronic kidney disease: Secondary | ICD-10-CM | POA: Diagnosis not present

## 2017-11-12 DIAGNOSIS — I252 Old myocardial infarction: Secondary | ICD-10-CM | POA: Diagnosis not present

## 2017-11-12 DIAGNOSIS — Z95810 Presence of automatic (implantable) cardiac defibrillator: Secondary | ICD-10-CM | POA: Insufficient documentation

## 2017-11-12 DIAGNOSIS — Z7984 Long term (current) use of oral hypoglycemic drugs: Secondary | ICD-10-CM | POA: Diagnosis not present

## 2017-11-12 DIAGNOSIS — E7800 Pure hypercholesterolemia, unspecified: Secondary | ICD-10-CM | POA: Diagnosis not present

## 2017-11-12 DIAGNOSIS — Z79899 Other long term (current) drug therapy: Secondary | ICD-10-CM | POA: Diagnosis not present

## 2017-11-12 NOTE — Progress Notes (Signed)
Advanced Heart Failure Clinic Note   Primary Cardiologist: Dr. Haroldine Laws  Nephrology: Dr Valaria Good [facility].   HPI:  [name] is a 57 year old female with a past medical history of HTN, chronic systolic CHF s/p BI V ICD (St. Jude),atrial tachycardia (2017), NICM (normal cors in 2014), CKD stage III, HLD, DM, OSA w/CPAP, and anemia.  She was admitted 12/26/16-12/27/16 with volume overload at [facility]. Echo that admission with EF 25-30%. She was discharged on Spiro 25mg  daily, Coreg 25mg  BID,and hydralazine 12.5mg  daily. Discharge weight was 177 pounds.   Her baseline creatinine appears to be 1.4-1.6 when reviewing her records from [facility]. Her last office visit with her Cardiologist Dr. Otho Perl was in January 2018 . He noted angioedema with ACE-I and cough with ARB.   She presented to the ED on 02/09/17 with SOB and abdominal bloating. Started on IV Lasix. RHC done and showed Fick output/index 4.5/2.5. PVR 3.8 WU. Discharge weight was 176 pounds.  Went to the ED on 03/15/17 with dizziness, orthostasis. She had taken an extra Lasix that day.   Today she returns for HF follow up. Overall feeling fine. Denies SOB/PND/Orthopnea. No chest pain.  Appetite ok. No fever or chills. Weight at home up a little. Taking all medications.    Review of systems complete and found to be negative unless listed in HPI.    Past Medical History:  Diagnosis Date  . Arthritis 02/08/2017  . 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 CAD (coronary artery disease) 09/01/2013  . CHF (congestive heart failure) ([place]) 06/07/2015  . CKD (chronic kidney disease), stage III ([place]) 12/09/2013  . Depression 07/27/2013  . Diabetes mellitus without complication ([place]) 80/99/8338  . GERD (gastroesophageal reflux disease) 07/27/2013  . Gout 12/29/2013  . Heart valve disorder 06/03/2013  . High cholesterol 11/10/2013  . Hypertension 11/10/2013  . IBS  (irritable bowel syndrome) 09/27/2014  . Myocardial infarct ([place])   . Sleep apnea 10/13/2013  . Vascular disorder 01/12/2014    Current Outpatient Medications  Medication Sig Dispense Refill  . amiodarone (PACERONE) 200 MG tablet Take 200 mg by mouth daily.    [name] aspirin 81 MG chewable tablet Chew 81 mg by mouth daily.    . Calcium Carbonate-Vitamin D3 (CALCIUM 600/VITAMIN D) 600-400 MG-UNIT TABS Take 1 tablet by mouth 2 (two) times daily.    . carvedilol (COREG) 25 MG tablet Take 12.5 mg by mouth 2 (two) times daily with a meal.    . ferrous sulfate 325 (65 FE) MG tablet Take 325 mg by mouth 3 (three) times daily.     [name] glipiZIDE (GLUCOTROL) 5 MG tablet Take 2.5 mg by mouth daily before breakfast.    . hydrALAZINE (APRESOLINE) 50 MG tablet Take 50 mg by mouth 3 (three) times daily.    . Investigational - Study Medication Take 1 tablet by mouth 2 (two) times daily. Study name: Galactic Heart Failure Study Additional study details: Omecamtiv Mecarbil or Placebo 1 each PRN  . isosorbide mononitrate (IMDUR) 60 MG 24 hr tablet Take 1 tablet (60 mg total) by mouth daily. 30 tablet 0  . loratadine (CLARITIN) 10 MG tablet Take 10 mg by mouth daily.    . Magnesium Oxide 420 MG TABS Take 840 mg by mouth daily.    . Multiple Vitamin (MULTIVITAMIN) tablet Take 1 tablet by mouth daily.    [name] omeprazole (PRILOSEC) 40 MG capsule Take 40 mg by mouth daily.    [name]  potassium chloride (K-DUR,KLOR-CON) 10 MEQ tablet Take 40 mEq by mouth 2 (two) times daily.    [name] rOPINIRole (REQUIP) 0.5 MG tablet Take 0.5 mg by mouth at bedtime.     . sertraline (ZOLOFT) 100 MG tablet Take 150 mg by mouth daily.    . simvastatin (ZOCOR) 20 MG tablet Take 20 mg by mouth daily.    [name] spironolactone (ALDACTONE) 25 MG tablet Take 12.5 mg by mouth daily.    [name] torsemide (DEMADEX) 20 MG tablet Take 1 tablet (20 mg total) by mouth 2 (two) times daily. 120 tablet 3  . metolazone (ZAROXOLYN) 2.5 MG tablet Take 1 tablet (2.5 mg total) by  mouth once a week. Monday 12 tablet 0   No current facility-administered medications for this encounter.    Allergies  Allergen Reactions  . Codeine Itching  . Gabapentin Itching  . Haldol [Haloperidol Lactate] Other (See Comments)    Dizziness, hallucinations  . Levofloxacin     Other reaction(s): Other (See Comments) Projectile vomiting   . Lisinopril Itching and Swelling  . Losartan Itching  . Morphine And Related Nausea And Vomiting  . Penicillins Nausea And Vomiting    "Vomiting, upset stomach, Headache" Has patient had a PCN reaction causing immediate rash, facial/tongue/throat swelling, SOB or lightheadedness with hypotension: No Has patient had a PCN reaction causing severe rash involving mucus membranes or skin necrosis: No Has patient had a PCN reaction that required hospitalization: No Has patient had a PCN reaction occurring within the last 10 years: No If all of the above answers are "NO", then may proceed with Cephalosporin use.    Social History   Socioeconomic History  . Marital status: Single    Spouse name: Not on file  . Number of children: Not on file  . Years of education: Not on file  . Highest education level: Not on file  Social Needs  . Financial resource strain: Not on file  . Food insecurity - worry: Not on file  . Food insecurity - inability: Not on file  . Transportation needs - medical: Not on file  . Transportation needs - non-medical: Not on file  Occupational History  . Not on file  Tobacco Use  . Smoking status: Never Smoker  . Smokeless tobacco: Never Used  Substance and Sexual Activity  . Alcohol use: No  . Drug use: No  . Sexual activity: Not on file  Other Topics Concern  . Not on file  Social History Narrative  . Not on file   Family History  Problem Relation Age of Onset  . Hypertension Mother    Vitals:   11/12/17 1514  BP: 132/78  Pulse: 71  SpO2: 99%  Weight: 198 lb 6.4 oz (90 kg)   Wt Readings from Last 3  Encounters:  11/12/17 198 lb 6.4 oz (90 kg)  11/07/17 195 lb 12.8 oz (88.8 kg)  10/25/17 195 lb 3.2 oz (88.5 kg)    PHYSICAL EXAM: General:  Well appearing. No resp difficulty HEENT: normal Neck: supple. no JVD. Carotids 2+ bilat; no bruits. No lymphadenopathy or thryomegaly appreciated. Cor: PMI nondisplaced. Regular rate & rhythm. No rubs, gallops or murmurs. Lungs: clear Abdomen: soft, nontender, nondistended. No hepatosplenomegaly. No bruits or masses. Good bowel sounds. Extremities: no cyanosis, clubbing, rash, edema Neuro: alert & orientedx3, cranial nerves grossly intact. moves all 4 extremities w/o difficulty. Affect pleasant   ASSESSMENT & PLAN: 1. Chronic systolic CHF: NICM, normal cors in 2014, likely related to HTN  vs. Noncompliance vs. Tachy - medicated with history of atrial tach.  - Echo 02/2017 EF 20%, moderate MR, moderate TR. PA pressure 80 mm Hg.   -ECHO 08/07/2017 EF 20-25% NYHA II. Volume status stable. Continue torsemide 40 mg BID.  - Continue metolazone 2.5 mg weekly.  - Continue Spiro 25 mg qhs. - Continue isosorbide 60 mg daily - Continue hydralazine 25 mg TID - Continue Coreg 12.5 mg BID.   - Check BMET next visit.   2. CKD stage III: Baseline creatinine 1.3-1.6.  I reviewed Creatinine from her Nephrologist. Creatinine 1.7.    3. BiV ICD  St Jude .   4. HTN - Stable.    5. History of atrial tachycardia - On Amiodarone 200 mg daily - Recent LFTs stable.    6. Gastric polyp  - had EGD on 04/02/17 found to have gastric polyp that was biopsied.  [facility]   Follow up in 3 months.   Darrick Grinder, NP 11/12/17

## 2017-11-12 NOTE — Patient Instructions (Signed)
Follow up 3 months with Amy Clegg NP-C.  _______________________________________________________ [place] Code: 3142  Take all medication as prescribed the day of your appointment. Bring all medications with you to your appointment.  Do the following things EVERYDAY: 1) Weigh yourself in the morning before breakfast. Write it down and keep it in a log. 2) Take your medicines as prescribed 3) Eat low salt foods-Limit salt (sodium) to 2000 mg per day.  4) Stay as active as you can everyday 5) Limit all fluids for the day to less than 2 liters

## 2017-11-21 ENCOUNTER — Other Ambulatory Visit (HOSPITAL_COMMUNITY): Payer: Self-pay | Admitting: Pharmacist

## 2017-11-21 ENCOUNTER — Other Ambulatory Visit (HOSPITAL_COMMUNITY): Payer: Self-pay

## 2017-11-21 MED ORDER — METOLAZONE 2.5 MG PO TABS: 2.5000 mg | ORAL_TABLET | ORAL | 0 refills | Status: DC

## 2017-11-21 MED ORDER — POTASSIUM CHLORIDE CRYS ER 10 MEQ PO TBCR: 40.0000 meq | EXTENDED_RELEASE_TABLET | Freq: Two times a day (BID) | ORAL | 2 refills | Status: DC

## 2017-11-21 NOTE — Progress Notes (Signed)
Paramedicine Encounter    Patient ID: Michelle Andrade, female    DOB: Nov 29, 1960, 57 y.o.   MRN: [id number]   Patient Care Team: Henderson Baltimore, [name] as PCP - General (Nurse Practitioner)  Patient Active Problem List   Diagnosis Date Noted  . AKI (acute kidney injury) ([place])   . Type 2 diabetes mellitus with stage 3 chronic kidney disease ([place]) 02/13/2017  . Acute on chronic systolic (congestive) heart failure ([place])   . CKD (chronic kidney disease), stage III ([place])   . Constipated   . CHF (congestive heart failure) ([place]) 02/09/2017  . Pulmonary vascular congestion 02/09/2017  . Hypertension   . Sleep apnea   . PAD (peripheral artery disease) ([place]) 01/12/2014    Current Outpatient Medications:  .  amiodarone (PACERONE) 200 MG tablet, Take 200 mg by mouth daily., Disp: , Rfl:  .  aspirin 81 MG chewable tablet, Chew 81 mg by mouth daily., Disp: , Rfl:  .  Calcium Carbonate-Vitamin D3 (CALCIUM 600/VITAMIN D) 600-400 MG-UNIT TABS, Take 1 tablet by mouth 2 (two) times daily., Disp: , Rfl:  .  carvedilol (COREG) 25 MG tablet, Take 12.5 mg by mouth 2 (two) times daily with a meal., Disp: , Rfl:  .  ferrous sulfate 325 (65 FE) MG tablet, Take 325 mg by mouth 3 (three) times daily. , Disp: , Rfl:  .  glipiZIDE (GLUCOTROL) 5 MG tablet, Take 2.5 mg by mouth daily before breakfast., Disp: , Rfl:  .  hydrALAZINE (APRESOLINE) 50 MG tablet, Take 50 mg by mouth 3 (three) times daily., Disp: , Rfl:  .  Investigational - Study Medication, Take 1 tablet by mouth 2 (two) times daily. Study name: Galactic Heart Failure Study Additional study details: Omecamtiv Mecarbil or Placebo, Disp: 1 each, Rfl: PRN .  isosorbide mononitrate (IMDUR) 60 MG 24 hr tablet, Take 1 tablet (60 mg total) by mouth daily., Disp: 30 tablet, Rfl: 0 .  loratadine (CLARITIN) 10 MG tablet, Take 10 mg by mouth daily., Disp: , Rfl:  .  Magnesium Oxide 420 MG TABS, Take 840 mg by mouth daily., Disp: , Rfl:  .  metolazone (ZAROXOLYN)  2.5 MG tablet, Take 1 tablet (2.5 mg total) by mouth once a week. Monday, Disp: 12 tablet, Rfl: 0 .  Multiple Vitamin (MULTIVITAMIN) tablet, Take 1 tablet by mouth daily., Disp: , Rfl:  .  omeprazole (PRILOSEC) 40 MG capsule, Take 40 mg by mouth daily., Disp: , Rfl:  .  potassium chloride (K-DUR,KLOR-CON) 10 MEQ tablet, Take 40 mEq by mouth 2 (two) times daily., Disp: , Rfl:  .  rOPINIRole (REQUIP) 0.5 MG tablet, Take 0.5 mg by mouth at bedtime. , Disp: , Rfl:  .  sertraline (ZOLOFT) 100 MG tablet, Take 150 mg by mouth daily., Disp: , Rfl:  .  simvastatin (ZOCOR) 20 MG tablet, Take 20 mg by mouth daily., Disp: , Rfl:  .  spironolactone (ALDACTONE) 25 MG tablet, Take 12.5 mg by mouth daily., Disp: , Rfl:  .  torsemide (DEMADEX) 20 MG tablet, Take 1 tablet (20 mg total) by mouth 2 (two) times daily., Disp: 120 tablet, Rfl: 3 Allergies  Allergen Reactions  . Codeine Itching  . Gabapentin Itching  . Haldol [Haloperidol Lactate] Other (See Comments)    Dizziness, hallucinations  . Levofloxacin     Other reaction(s): Other (See Comments) Projectile vomiting   . Lisinopril Itching and Swelling  . Losartan Itching  . Morphine And Related Nausea And Vomiting  . Penicillins Nausea  And Vomiting    "Vomiting, upset stomach, Headache" Has patient had a PCN reaction causing immediate rash, facial/tongue/throat swelling, SOB or lightheadedness with hypotension: No Has patient had a PCN reaction causing severe rash involving mucus membranes or skin necrosis: No Has patient had a PCN reaction that required hospitalization: No Has patient had a PCN reaction occurring within the last 10 years: No If all of the above answers are "NO", then may proceed with Cephalosporin use.       Social History   Socioeconomic History  . Marital status: Single    Spouse name: Not on file  . Number of children: Not on file  . Years of education: Not on file  . Highest education level: Not on file  Occupational  History  . Not on file  Social Needs  . Financial resource strain: Not on file  . Food insecurity:    Worry: Not on file    Inability: Not on file  . Transportation needs:    Medical: Not on file    Non-medical: Not on file  Tobacco Use  . Smoking status: Never Smoker  . Smokeless tobacco: Never Used  Substance and Sexual Activity  . Alcohol use: No  . Drug use: No  . Sexual activity: Not on file  Lifestyle  . Physical activity:    Days per week: Not on file    Minutes per session: Not on file  . Stress: Not on file  Relationships  . Social connections:    Talks on phone: Not on file    Gets together: Not on file    Attends religious service: Not on file    Active member of club or organization: Not on file    Attends meetings of clubs or organizations: Not on file    Relationship status: Not on file  . Intimate partner violence:    Fear of current or ex partner: Not on file    Emotionally abused: Not on file    Physically abused: Not on file    Forced sexual activity: Not on file  Other Topics Concern  . Not on file  Social History Narrative  . Not on file    Physical Exam  Constitutional: She is oriented to person, place, and time.  Cardiovascular: Normal rate and regular rhythm.  Pulmonary/Chest: Effort normal and breath sounds normal. No respiratory distress. She has no wheezes. She has no rales.  Abdominal: Soft.  Musculoskeletal: Normal range of motion. She exhibits no edema.  Neurological: She is alert and oriented to person, place, and time.  Skin: Skin is warm and dry.  Psychiatric: She has a normal mood and affect.        Future Appointments  Date Time Provider [place]  01/20/2018 10:00 AM [place] [facility] None  02/18/2018  2:30 PM [facility] PA/NP [facility] None    BP (!) 112/58 (BP Location: Right Arm, Patient Position: Sitting, Cuff Size: Large)   Pulse 70   Resp 16   Wt 198 lb (89.8 kg)   SpO2 95%   BMI  35.07 kg/m   Weight yesterday- did not weigh Last visit weight- 195 lb  Michelle Andrade was seen at home today and reported feeling generally well. She reported being compliant with her medications and and denied SOB, headaches, dizziness or orthopnea. She is having moderate left elbow and bicep pain which has been ongoing for over a week and is tender to palpate and makes it difficult to  straighten her arm. I advised her to seek the counsel of her orthopedist. She did not have any other concerns. Her medications were verified and her pillbox was refilled.   Time spent with patient: 61 minutes  [name], EMT 11/21/17  ACTION: Home visit completed Next visit planned for 2 weeks

## 2017-11-22 ENCOUNTER — Encounter (HOSPITAL_BASED_OUTPATIENT_CLINIC_OR_DEPARTMENT_OTHER): Payer: Self-pay | Admitting: Emergency Medicine

## 2017-11-22 ENCOUNTER — Emergency Department (HOSPITAL_BASED_OUTPATIENT_CLINIC_OR_DEPARTMENT_OTHER)
Admission: EM | Admit: 2017-11-22 | Discharge: 2017-11-22 | Disposition: A | Discharge status: Home / Self Care | Payer: Medicare Other | Attending: Emergency Medicine | Admitting: Emergency Medicine

## 2017-11-22 ENCOUNTER — Other Ambulatory Visit: Payer: Self-pay

## 2017-11-22 DIAGNOSIS — I252 Old myocardial infarction: Secondary | ICD-10-CM | POA: Insufficient documentation

## 2017-11-22 DIAGNOSIS — I509 Heart failure, unspecified: Secondary | ICD-10-CM | POA: Diagnosis not present

## 2017-11-22 DIAGNOSIS — I2510 Atherosclerotic heart disease of native coronary artery without angina pectoris: Secondary | ICD-10-CM | POA: Insufficient documentation

## 2017-11-22 DIAGNOSIS — Z95810 Presence of automatic (implantable) cardiac defibrillator: Secondary | ICD-10-CM | POA: Diagnosis not present

## 2017-11-22 DIAGNOSIS — N183 Chronic kidney disease, stage 3 (moderate): Secondary | ICD-10-CM | POA: Insufficient documentation

## 2017-11-22 DIAGNOSIS — I130 Hypertensive heart and chronic kidney disease with heart failure and stage 1 through stage 4 chronic kidney disease, or unspecified chronic kidney disease: Secondary | ICD-10-CM | POA: Diagnosis not present

## 2017-11-22 DIAGNOSIS — Z7984 Long term (current) use of oral hypoglycemic drugs: Secondary | ICD-10-CM | POA: Insufficient documentation

## 2017-11-22 DIAGNOSIS — M7522 Bicipital tendinitis, left shoulder: Secondary | ICD-10-CM | POA: Diagnosis not present

## 2017-11-22 DIAGNOSIS — Z79899 Other long term (current) drug therapy: Secondary | ICD-10-CM | POA: Insufficient documentation

## 2017-11-22 DIAGNOSIS — E1122 Type 2 diabetes mellitus with diabetic chronic kidney disease: Secondary | ICD-10-CM | POA: Diagnosis not present

## 2017-11-22 DIAGNOSIS — Z7982 Long term (current) use of aspirin: Secondary | ICD-10-CM | POA: Insufficient documentation

## 2017-11-22 DIAGNOSIS — M79602 Pain in left arm: Secondary | ICD-10-CM | POA: Diagnosis present

## 2017-11-22 MED ORDER — DICLOFENAC SODIUM 1 % TD GEL: 2.0000 g | Freq: Four times a day (QID) | TRANSDERMAL | 0 refills | Status: DC

## 2017-11-22 MED FILL — DICLOFENAC SODIUM 1% GEL: 1 | 13 days supply | Qty: 100 | Fill #0

## 2017-11-22 NOTE — ED Provider Notes (Signed)
[name] EMERGENCY DEPARTMENT Provider Note   CSN: [id number] Arrival date & time: 11/22/17  1002     History   Chief Complaint Chief Complaint  Patient presents with  . Arm Pain    HPI Michelle Andrade is a 57 y.o. female presenting for evaluation of left arm pain.  Patient states that for the past several weeks, she has had left arm pain.  This began gradually.  She denies fall, trauma, or injury precipitating the pain.  She states pain is present with movement, specifically flexing her elbow.  Pain is worse at the elbow and radiates up towards her shoulder and down towards her wrist.  She denies numbness or tingling.  She has bilateral shoulder replacements (surgery >10 yrs ago).  She has not taken anything for pain including Tylenol, ibuprofen, or her Flexeril.  She has been using heat and ice without improvement of symptoms.  She denies dropping things.  No pain at rest.  Nothing makes it better or worse. She does not have an orthopedic doctor currently.   HPI  Past Medical History:  Diagnosis Date  . Arthritis 02/08/2017  . 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 CAD (coronary artery disease) 09/01/2013  . CHF (congestive heart failure) ([place]) 06/07/2015  . CKD (chronic kidney disease), stage III ([place]) 12/09/2013  . Depression 07/27/2013  . Diabetes mellitus without complication ([place]) 38/06/1750  . GERD (gastroesophageal reflux disease) 07/27/2013  . Gout 12/29/2013  . Heart valve disorder 06/03/2013  . High cholesterol 11/10/2013  . Hypertension 11/10/2013  . IBS (irritable bowel syndrome) 09/27/2014  . Myocardial infarct ([place])   . Sleep apnea 10/13/2013  . Vascular disorder 01/12/2014    Patient Active Problem List   Diagnosis Date Noted  . AKI (acute kidney injury) ([place])   . Type 2 diabetes mellitus with stage 3 chronic kidney disease ([place]) 02/13/2017  . Acute on chronic systolic (congestive) heart failure  ([place])   . CKD (chronic kidney disease), stage III ([place])   . Constipated   . CHF (congestive heart failure) ([place]) 02/09/2017  . Pulmonary vascular congestion 02/09/2017  . Hypertension   . Sleep apnea   . PAD (peripheral artery disease) ([place]) 01/12/2014    Past Surgical History:  Procedure Laterality Date  . ABDOMINAL HYSTERECTOMY    . CARDIAC DEFIBRILLATOR PLACEMENT    . CARPAL TUNNEL RELEASE    . HEEL SPUR EXCISION    . KNEE SURGERY    . PACEMAKER GENERATOR CHANGE    . RIGHT HEART CATH N/A 02/13/2017   Procedure: Right Heart Cath;  Surgeon: Jolaine Artist, MD;  Location: [facility];  Service: Cardiovascular;  Laterality: N/A;  . SHOULDER SURGERY      OB History   None      Home Medications    Prior to Admission medications   Medication Sig Start Date End Date Taking? Authorizing Provider  amiodarone (PACERONE) 200 MG tablet Take 200 mg by mouth daily.    [provider]  aspirin 81 MG chewable tablet Chew 81 mg by mouth daily.    [provider]  Calcium Carbonate-Vitamin D3 (CALCIUM 600/VITAMIN D) 600-400 MG-UNIT TABS Take 1 tablet by mouth 2 (two) times daily.    [provider]  carvedilol (COREG) 25 MG tablet Take 12.5 mg by mouth 2 (two) times daily with a meal.    [provider]  diclofenac sodium (VOLTAREN) 1 % GEL Apply 2 g topically 4 (four) times  daily. 11/22/17   Marji Kuehnel, PA-C  ferrous sulfate 325 (65 FE) MG tablet Take 325 mg by mouth 3 (three) times daily.     [provider]  glipiZIDE (GLUCOTROL) 5 MG tablet Take 2.5 mg by mouth daily before breakfast.    [provider]  hydrALAZINE (APRESOLINE) 50 MG tablet Take 50 mg by mouth 3 (three) times daily.    [provider]  Investigational - Study Medication Take 1 tablet by mouth 2 (two) times daily. Study name: Galactic Heart Failure Study Additional study details: Omecamtiv Mecarbil or Placebo 02/15/17   Larey Dresser, MD    isosorbide mononitrate (IMDUR) 60 MG 24 hr tablet Take 1 tablet (60 mg total) by mouth daily. 02/16/17   Mariel Aloe, MD  loratadine (CLARITIN) 10 MG tablet Take 10 mg by mouth daily.    [provider]  Magnesium Oxide 420 MG TABS Take 840 mg by mouth daily.    [provider]  metolazone (ZAROXOLYN) 2.5 MG tablet Take 1 tablet (2.5 mg total) by mouth once a week. Monday 11/21/17 02/19/18  Bensimhon, Shaune Pascal, MD  Multiple Vitamin (MULTIVITAMIN) tablet Take 1 tablet by mouth daily.    [provider]  omeprazole (PRILOSEC) 40 MG capsule Take 40 mg by mouth daily.    [provider]  potassium chloride (K-DUR,KLOR-CON) 10 MEQ tablet Take 4 tablets (40 mEq total) by mouth 2 (two) times daily. 11/21/17   Bensimhon, Shaune Pascal, MD  rOPINIRole (REQUIP) 0.5 MG tablet Take 0.5 mg by mouth at bedtime.     [provider]  sertraline (ZOLOFT) 100 MG tablet Take 150 mg by mouth daily.    [provider]  simvastatin (ZOCOR) 20 MG tablet Take 20 mg by mouth daily.    [provider]  spironolactone (ALDACTONE) 25 MG tablet Take 12.5 mg by mouth daily.    [provider]  torsemide (DEMADEX) 20 MG tablet Take 1 tablet (20 mg total) by mouth 2 (two) times daily. 04/11/17   Shirley Friar, PA-C    Family History Family History  Problem Relation Age of Onset  . Hypertension Mother     Social History Social History   Tobacco Use  . Smoking status: Never Smoker  . Smokeless tobacco: Never Used  Substance Use Topics  . Alcohol use: No  . Drug use: No     Allergies   Codeine; Gabapentin; Haldol [haloperidol lactate]; Levofloxacin; Lisinopril; Losartan; Morphine and related; and Penicillins   Review of Systems Review of Systems  Musculoskeletal: Positive for arthralgias and myalgias.  Skin: Negative for wound.  Neurological: Negative for numbness.     Physical Exam Updated Vital Signs BP 120/65 (BP Location:  Right Arm)   Pulse 70   Temp 98.6 F (37 C) (Oral)   Resp 18   Ht 5\' 2"  (1.575 m)   Wt 89.8 kg (198 lb)   SpO2 95%   BMI 36.21 kg/m   Physical Exam  Constitutional: She is oriented to person, place, and time. She appears well-developed and well-nourished. No distress.  HENT:  Head: Normocephalic and atraumatic.  Eyes: EOM are normal.  Neck: Normal range of motion.  Pulmonary/Chest: Effort normal.  Abdominal: She exhibits no distension.  Musculoskeletal: Normal range of motion. She exhibits tenderness.  Tenderness palpation of the biceps muscle in the left arm, worse at the insertion of the biceps tendon at the elbow.  Full active range of motion with pain, decreased pain with  passive range of motion.  Radial pulses intact bilaterally.  Grip strength equal bilaterally.  Sensation intact bilaterally.  No obvious deformity, swelling, or injury.  No warmth or erythema.   Neurological: She is alert and oriented to person, place, and time. No sensory deficit.  Skin: Skin is warm. No rash noted.  Psychiatric: She has a normal mood and affect.  Nursing note and vitals reviewed.    ED Treatments / Results  Labs (all labs ordered are listed, but only abnormal results are displayed) Labs Reviewed - No data to display  EKG  EKG Interpretation None       Radiology No results found.  Procedures Procedures (including critical care time)  Medications Ordered in ED Medications - No data to display   Initial Impression / Assessment and Plan / ED Course  I have reviewed the triage vital signs and the nursing notes.  Pertinent labs & imaging results that were available during my care of the patient were reviewed by me and considered in my medical decision making (see chart for details).     Patient presenting for several week history of left arm pain.  Physical exam reassuring, she is neurovascularly intact.  Tenderness to palpation of the biceps muscle, likely muscular pain.   Doubt septic joint, fracture, or nerve injury.  Will treat with rest, sling, and Voltaren gel.  Patient to take her Flexeril as needed.  Given follow-up for orthopedics and/or primary care as needed.  Case discussed with attending, Dr. Gilford Raid evaluated the patient.  At this time, patient appears safe for discharge.  Return precautions given.  Patient states she understands agrees to plan.   Final Clinical Impressions(s) / ED Diagnoses   Final diagnoses:  Biceps tendinitis of left upper extremity    ED Discharge Orders        Ordered    diclofenac sodium (VOLTAREN) 1 % GEL  4 times daily     11/22/17 1057       [place], Jaylee Lantry, PA-C 11/22/17 1128    Isla Pence, MD 11/22/17 1134

## 2017-11-22 NOTE — Discharge Instructions (Signed)
Use the sling when you are awake as needed for comfort.  Take flexeril at night as needed for continued stiffness and soreness.  Continue to apply heat to the area.  Use voltaren gel as needed for pain.  Follow up with your primary care or orthopedics for further evaluation if your symptoms are not improving in 1 week.  Return to the ER if you develop fevers, numbness, inability to move your arm, or any new or concerning symptoms.

## 2017-11-22 NOTE — ED Triage Notes (Signed)
Pain in left elbow radiating into forearm and shoulder x "at least 2 weeks."  Sts she was doing some heavy lifting and it has been getting worse.  Sts she has had bil shoulder replacements.

## 2017-12-03 ENCOUNTER — Other Ambulatory Visit (HOSPITAL_COMMUNITY): Payer: Self-pay

## 2017-12-03 NOTE — Progress Notes (Signed)
Paramedicine Encounter    Patient ID: Michelle Andrade, female    DOB: 02-13-1961, 57 y.o.   MRN: [id number]   Patient Care Team: Henderson Baltimore, [name] as PCP - General (Nurse Practitioner)  Patient Active Problem List   Diagnosis Date Noted  . AKI (acute kidney injury) ([place])   . Type 2 diabetes mellitus with stage 3 chronic kidney disease ([place]) 02/13/2017  . Acute on chronic systolic (congestive) heart failure ([place])   . CKD (chronic kidney disease), stage III ([place])   . Constipated   . CHF (congestive heart failure) ([place]) 02/09/2017  . Pulmonary vascular congestion 02/09/2017  . Hypertension   . Sleep apnea   . PAD (peripheral artery disease) ([place]) 01/12/2014    Current Outpatient Medications:  .  amiodarone (PACERONE) 200 MG tablet, Take 200 mg by mouth daily., Disp: , Rfl:  .  aspirin 81 MG chewable tablet, Chew 81 mg by mouth daily., Disp: , Rfl:  .  Calcium Carbonate-Vitamin D3 (CALCIUM 600/VITAMIN D) 600-400 MG-UNIT TABS, Take 1 tablet by mouth 2 (two) times daily., Disp: , Rfl:  .  carvedilol (COREG) 25 MG tablet, Take 12.5 mg by mouth 2 (two) times daily with a meal., Disp: , Rfl:  .  diclofenac sodium (VOLTAREN) 1 % GEL, Apply 2 g topically 4 (four) times daily., Disp: 100 g, Rfl: 0 .  ferrous sulfate 325 (65 FE) MG tablet, Take 325 mg by mouth 3 (three) times daily. , Disp: , Rfl:  .  glipiZIDE (GLUCOTROL) 5 MG tablet, Take 2.5 mg by mouth daily before breakfast., Disp: , Rfl:  .  hydrALAZINE (APRESOLINE) 50 MG tablet, Take 50 mg by mouth 3 (three) times daily., Disp: , Rfl:  .  Investigational - Study Medication, Take 1 tablet by mouth 2 (two) times daily. Study name: Galactic Heart Failure Study Additional study details: Omecamtiv Mecarbil or Placebo, Disp: 1 each, Rfl: PRN .  isosorbide mononitrate (IMDUR) 60 MG 24 hr tablet, Take 1 tablet (60 mg total) by mouth daily., Disp: 30 tablet, Rfl: 0 .  loratadine (CLARITIN) 10 MG tablet, Take 10 mg by mouth daily., Disp: , Rfl:   .  Magnesium Oxide 420 MG TABS, Take 840 mg by mouth daily., Disp: , Rfl:  .  metolazone (ZAROXOLYN) 2.5 MG tablet, Take 1 tablet (2.5 mg total) by mouth once a week. Monday, Disp: 12 tablet, Rfl: 0 .  Multiple Vitamin (MULTIVITAMIN) tablet, Take 1 tablet by mouth daily., Disp: , Rfl:  .  omeprazole (PRILOSEC) 40 MG capsule, Take 40 mg by mouth daily., Disp: , Rfl:  .  potassium chloride (K-DUR,KLOR-CON) 10 MEQ tablet, Take 4 tablets (40 mEq total) by mouth 2 (two) times daily., Disp: 240 tablet, Rfl: 2 .  rOPINIRole (REQUIP) 0.5 MG tablet, Take 0.5 mg by mouth at bedtime. , Disp: , Rfl:  .  sertraline (ZOLOFT) 100 MG tablet, Take 150 mg by mouth daily., Disp: , Rfl:  .  simvastatin (ZOCOR) 20 MG tablet, Take 20 mg by mouth daily., Disp: , Rfl:  .  spironolactone (ALDACTONE) 25 MG tablet, Take 12.5 mg by mouth daily., Disp: , Rfl:  .  torsemide (DEMADEX) 20 MG tablet, Take 1 tablet (20 mg total) by mouth 2 (two) times daily., Disp: 120 tablet, Rfl: 3 Allergies  Allergen Reactions  . Codeine Itching  . Gabapentin Itching  . Haldol [Haloperidol Lactate] Other (See Comments)    Dizziness, hallucinations  . Levofloxacin     Other reaction(s): Other (See Comments)  Projectile vomiting   . Lisinopril Itching and Swelling  . Losartan Itching  . Morphine And Related Nausea And Vomiting  . Penicillins Nausea And Vomiting    "Vomiting, upset stomach, Headache" Has patient had a PCN reaction causing immediate rash, facial/tongue/throat swelling, SOB or lightheadedness with hypotension: No Has patient had a PCN reaction causing severe rash involving mucus membranes or skin necrosis: No Has patient had a PCN reaction that required hospitalization: No Has patient had a PCN reaction occurring within the last 10 years: No If all of the above answers are "NO", then may proceed with Cephalosporin use.       Social History   Socioeconomic History  . Marital status: Single    Spouse name: Not on  file  . Number of children: Not on file  . Years of education: Not on file  . Highest education level: Not on file  Occupational History  . Not on file  Social Needs  . Financial resource strain: Not on file  . Food insecurity:    Worry: Not on file    Inability: Not on file  . Transportation needs:    Medical: Not on file    Non-medical: Not on file  Tobacco Use  . Smoking status: Never Smoker  . Smokeless tobacco: Never Used  Substance and Sexual Activity  . Alcohol use: No  . Drug use: No  . Sexual activity: Not on file  Lifestyle  . Physical activity:    Days per week: Not on file    Minutes per session: Not on file  . Stress: Not on file  Relationships  . Social connections:    Talks on phone: Not on file    Gets together: Not on file    Attends religious service: Not on file    Active member of club or organization: Not on file    Attends meetings of clubs or organizations: Not on file    Relationship status: Not on file  . Intimate partner violence:    Fear of current or ex partner: Not on file    Emotionally abused: Not on file    Physically abused: Not on file    Forced sexual activity: Not on file  Other Topics Concern  . Not on file  Social History Narrative  . Not on file    Physical Exam  Constitutional: She is oriented to person, place, and time.  Cardiovascular: Normal rate and regular rhythm.  Pulmonary/Chest: Effort normal and breath sounds normal. No respiratory distress. She has no wheezes. She has no rales.  Abdominal: She exhibits distension.  Musculoskeletal: Normal range of motion. She exhibits no edema.  Neurological: She is alert and oriented to person, place, and time.  Skin: Skin is warm and dry.  Psychiatric: She has a normal mood and affect.        Future Appointments  Date Time Provider [place]  01/20/2018 10:00 AM [place] [facility] None  02/18/2018  2:30 PM [facility] PA/NP [facility] None     BP 112/70 (BP Location: Left Arm, Patient Position: Sitting, Cuff Size: Large)   Pulse 66   Resp 16   Wt 198 lb 12.8 oz (90.2 kg)   SpO2 98%   BMI 36.36 kg/m   Weight yesterday- 198.6 lb Last visit weight- 198 lb  MS [name] was seen at home today and reported feeling bloated and her abdomen was distended. She stated she is being compliant with her medications however her  diet is still poor. She requested I ask the clinic of she can start taking more torsemide because of her bloating but I advised that due to her CKD she would be better served by decreasing her sodium and fluid intake. Her medications were verified and her pillbox was refilled.   Time spent with patient: 64 minutes  Michelle Andrade, EMT 12/03/17  ACTION: Home visit completed Next visit planned for 2 weeks

## 2017-12-18 ENCOUNTER — Other Ambulatory Visit (HOSPITAL_COMMUNITY): Payer: Self-pay

## 2017-12-18 NOTE — Progress Notes (Signed)
Paramedicine Encounter    Patient ID: Michelle Andrade, female    DOB: 1961-01-24, 57 y.o.   MRN: [id number]   Patient Care Team: Henderson Baltimore, [name] as PCP - General (Nurse Practitioner)  Patient Active Problem List   Diagnosis Date Noted  . AKI (acute kidney injury) ([place])   . Type 2 diabetes mellitus with stage 3 chronic kidney disease ([place]) 02/13/2017  . Acute on chronic systolic (congestive) heart failure ([place])   . CKD (chronic kidney disease), stage III ([place])   . Constipated   . CHF (congestive heart failure) ([place]) 02/09/2017  . Pulmonary vascular congestion 02/09/2017  . Hypertension   . Sleep apnea   . PAD (peripheral artery disease) ([place]) 01/12/2014    Current Outpatient Medications:  .  amiodarone (PACERONE) 200 MG tablet, Take 200 mg by mouth daily., Disp: , Rfl:  .  aspirin 81 MG chewable tablet, Chew 81 mg by mouth daily., Disp: , Rfl:  .  Calcium Carbonate-Vitamin D3 (CALCIUM 600/VITAMIN D) 600-400 MG-UNIT TABS, Take 1 tablet by mouth 2 (two) times daily., Disp: , Rfl:  .  carvedilol (COREG) 25 MG tablet, Take 12.5 mg by mouth 2 (two) times daily with a meal., Disp: , Rfl:  .  ferrous sulfate 325 (65 FE) MG tablet, Take 325 mg by mouth 3 (three) times daily. , Disp: , Rfl:  .  glipiZIDE (GLUCOTROL) 5 MG tablet, Take 2.5 mg by mouth daily before breakfast., Disp: , Rfl:  .  hydrALAZINE (APRESOLINE) 50 MG tablet, Take 50 mg by mouth 3 (three) times daily., Disp: , Rfl:  .  Investigational - Study Medication, Take 1 tablet by mouth 2 (two) times daily. Study name: Galactic Heart Failure Study Additional study details: Omecamtiv Mecarbil or Placebo, Disp: 1 each, Rfl: PRN .  isosorbide mononitrate (IMDUR) 60 MG 24 hr tablet, Take 1 tablet (60 mg total) by mouth daily., Disp: 30 tablet, Rfl: 0 .  loratadine (CLARITIN) 10 MG tablet, Take 10 mg by mouth daily., Disp: , Rfl:  .  Magnesium Oxide 420 MG TABS, Take 840 mg by mouth daily., Disp: , Rfl:  .  metolazone (ZAROXOLYN)  2.5 MG tablet, Take 1 tablet (2.5 mg total) by mouth once a week. Monday, Disp: 12 tablet, Rfl: 0 .  Multiple Vitamin (MULTIVITAMIN) tablet, Take 1 tablet by mouth daily., Disp: , Rfl:  .  omeprazole (PRILOSEC) 40 MG capsule, Take 40 mg by mouth daily., Disp: , Rfl:  .  potassium chloride (K-DUR,KLOR-CON) 10 MEQ tablet, Take 4 tablets (40 mEq total) by mouth 2 (two) times daily., Disp: 240 tablet, Rfl: 2 .  rOPINIRole (REQUIP) 0.5 MG tablet, Take 0.5 mg by mouth at bedtime. , Disp: , Rfl:  .  sertraline (ZOLOFT) 100 MG tablet, Take 150 mg by mouth daily., Disp: , Rfl:  .  simvastatin (ZOCOR) 20 MG tablet, Take 20 mg by mouth daily., Disp: , Rfl:  .  spironolactone (ALDACTONE) 25 MG tablet, Take 12.5 mg by mouth daily., Disp: , Rfl:  .  torsemide (DEMADEX) 20 MG tablet, Take 1 tablet (20 mg total) by mouth 2 (two) times daily., Disp: 120 tablet, Rfl: 3 .  diclofenac sodium (VOLTAREN) 1 % GEL, Apply 2 g topically 4 (four) times daily. (Patient not taking: Reported on 12/18/2017), Disp: 100 g, Rfl: 0 Allergies  Allergen Reactions  . Codeine Itching  . Gabapentin Itching  . Haldol [Haloperidol Lactate] Other (See Comments)    Dizziness, hallucinations  . Levofloxacin  Other reaction(s): Other (See Comments) Projectile vomiting   . Lisinopril Itching and Swelling  . Losartan Itching  . Morphine And Related Nausea And Vomiting  . Penicillins Nausea And Vomiting    "Vomiting, upset stomach, Headache" Has patient had a PCN reaction causing immediate rash, facial/tongue/throat swelling, SOB or lightheadedness with hypotension: No Has patient had a PCN reaction causing severe rash involving mucus membranes or skin necrosis: No Has patient had a PCN reaction that required hospitalization: No Has patient had a PCN reaction occurring within the last 10 years: No If all of the above answers are "NO", then may proceed with Cephalosporin use.       Social History   Socioeconomic History  .  Marital status: Single    Spouse name: Not on file  . Number of children: Not on file  . Years of education: Not on file  . Highest education level: Not on file  Occupational History  . Not on file  Social Needs  . Financial resource strain: Not on file  . Food insecurity:    Worry: Not on file    Inability: Not on file  . Transportation needs:    Medical: Not on file    Non-medical: Not on file  Tobacco Use  . Smoking status: Never Smoker  . Smokeless tobacco: Never Used  Substance and Sexual Activity  . Alcohol use: No  . Drug use: No  . Sexual activity: Not on file  Lifestyle  . Physical activity:    Days per week: Not on file    Minutes per session: Not on file  . Stress: Not on file  Relationships  . Social connections:    Talks on phone: Not on file    Gets together: Not on file    Attends religious service: Not on file    Active member of club or organization: Not on file    Attends meetings of clubs or organizations: Not on file    Relationship status: Not on file  . Intimate partner violence:    Fear of current or ex partner: Not on file    Emotionally abused: Not on file    Physically abused: Not on file    Forced sexual activity: Not on file  Other Topics Concern  . Not on file  Social History Narrative  . Not on file    Physical Exam  Constitutional: She is oriented to person, place, and time.  Cardiovascular: Normal rate and regular rhythm.  Pulmonary/Chest: Effort normal and breath sounds normal.  Abdominal: Soft. She exhibits no distension.  Musculoskeletal: Normal range of motion. She exhibits no edema.  Neurological: She is alert and oriented to person, place, and time.  Skin: Skin is warm and dry.  Psychiatric: She has a normal mood and affect.        Future Appointments  Date Time Provider [place]  01/20/2018 10:00 AM [place] 2 [facility] None  02/18/2018  2:30 PM [facility] PA/NP [facility] None    BP  132/90 (BP Location: Left Arm, Patient Position: Sitting, Cuff Size: Large)   Pulse 86   Resp 18   Wt 197 lb (89.4 kg)   SpO2 93%   BMI 36.03 kg/m   Weight yesterday- 196 lb Last visit weight- 198 lb  Michelle Andrade was seen at home and reported feeling well. She stated she has been drinking orange juice lately which has made her blood sugar remain elevated. She denied SOB, headache, dizziness or  orthopnea. She was working in the yard and had a large container full of iced water with her. I told her to be mindful of the amount of fluid she is drinking and she said she would. I also encouraged her to get inside before the hottest part of the day came to prevent her from becoming overheated and she agreed. Her medications were verified and her pillbox was refilled.   Time spent with patient: 49 minutes  [name], EMT 12/18/17  ACTION: Home visit completed Next visit planned for 2 weeks

## 2018-01-01 ENCOUNTER — Other Ambulatory Visit (HOSPITAL_COMMUNITY): Payer: Self-pay

## 2018-01-01 NOTE — Progress Notes (Signed)
Paramedicine Encounter    Patient ID: Michelle Andrade, female    DOB: Jun 05, 1961, 57 y.o.   MRN: [id number]   Patient Care Team: Henderson Baltimore, [name] as PCP - General (Nurse Practitioner)  Patient Active Problem List   Diagnosis Date Noted  . AKI (acute kidney injury) ([place])   . Type 2 diabetes mellitus with stage 3 chronic kidney disease ([place]) 02/13/2017  . Acute on chronic systolic (congestive) heart failure ([place])   . CKD (chronic kidney disease), stage III ([place])   . Constipated   . CHF (congestive heart failure) ([place]) 02/09/2017  . Pulmonary vascular congestion 02/09/2017  . Hypertension   . Sleep apnea   . PAD (peripheral artery disease) ([place]) 01/12/2014    Current Outpatient Medications:  .  amiodarone (PACERONE) 200 MG tablet, Take 200 mg by mouth daily., Disp: , Rfl:  .  aspirin 81 MG chewable tablet, Chew 81 mg by mouth daily., Disp: , Rfl:  .  Calcium Carbonate-Vitamin D3 (CALCIUM 600/VITAMIN D) 600-400 MG-UNIT TABS, Take 1 tablet by mouth 2 (two) times daily., Disp: , Rfl:  .  carvedilol (COREG) 25 MG tablet, Take 12.5 mg by mouth 2 (two) times daily with a meal., Disp: , Rfl:  .  ferrous sulfate 325 (65 FE) MG tablet, Take 325 mg by mouth 3 (three) times daily. , Disp: , Rfl:  .  glipiZIDE (GLUCOTROL) 5 MG tablet, Take 2.5 mg by mouth daily before breakfast., Disp: , Rfl:  .  hydrALAZINE (APRESOLINE) 50 MG tablet, Take 50 mg by mouth 3 (three) times daily., Disp: , Rfl:  .  Investigational - Study Medication, Take 1 tablet by mouth 2 (two) times daily. Study name: Galactic Heart Failure Study Additional study details: Omecamtiv Mecarbil or Placebo, Disp: 1 each, Rfl: PRN .  isosorbide mononitrate (IMDUR) 60 MG 24 hr tablet, Take 1 tablet (60 mg total) by mouth daily., Disp: 30 tablet, Rfl: 0 .  loratadine (CLARITIN) 10 MG tablet, Take 10 mg by mouth daily., Disp: , Rfl:  .  Magnesium Oxide 420 MG TABS, Take 840 mg by mouth daily., Disp: , Rfl:  .  metolazone (ZAROXOLYN)  2.5 MG tablet, Take 1 tablet (2.5 mg total) by mouth once a week. Monday, Disp: 12 tablet, Rfl: 0 .  Multiple Vitamin (MULTIVITAMIN) tablet, Take 1 tablet by mouth daily., Disp: , Rfl:  .  omeprazole (PRILOSEC) 40 MG capsule, Take 40 mg by mouth daily., Disp: , Rfl:  .  potassium chloride (K-DUR,KLOR-CON) 10 MEQ tablet, Take 4 tablets (40 mEq total) by mouth 2 (two) times daily., Disp: 240 tablet, Rfl: 2 .  rOPINIRole (REQUIP) 0.5 MG tablet, Take 0.5 mg by mouth at bedtime. , Disp: , Rfl:  .  sertraline (ZOLOFT) 100 MG tablet, Take 150 mg by mouth daily., Disp: , Rfl:  .  simvastatin (ZOCOR) 20 MG tablet, Take 20 mg by mouth daily., Disp: , Rfl:  .  spironolactone (ALDACTONE) 25 MG tablet, Take 12.5 mg by mouth daily., Disp: , Rfl:  .  torsemide (DEMADEX) 20 MG tablet, Take 1 tablet (20 mg total) by mouth 2 (two) times daily., Disp: 120 tablet, Rfl: 3 .  diclofenac sodium (VOLTAREN) 1 % GEL, Apply 2 g topically 4 (four) times daily. (Patient not taking: Reported on 12/18/2017), Disp: 100 g, Rfl: 0 Allergies  Allergen Reactions  . Codeine Itching  . Gabapentin Itching  . Haldol [Haloperidol Lactate] Other (See Comments)    Dizziness, hallucinations  . Levofloxacin  Other reaction(s): Other (See Comments) Projectile vomiting   . Lisinopril Itching and Swelling  . Losartan Itching  . Morphine And Related Nausea And Vomiting  . Penicillins Nausea And Vomiting    "Vomiting, upset stomach, Headache" Has patient had a PCN reaction causing immediate rash, facial/tongue/throat swelling, SOB or lightheadedness with hypotension: No Has patient had a PCN reaction causing severe rash involving mucus membranes or skin necrosis: No Has patient had a PCN reaction that required hospitalization: No Has patient had a PCN reaction occurring within the last 10 years: No If all of the above answers are "NO", then may proceed with Cephalosporin use.       Social History   Socioeconomic History  .  Marital status: Single    Spouse name: Not on file  . Number of children: Not on file  . Years of education: Not on file  . Highest education level: Not on file  Occupational History  . Not on file  Social Needs  . Financial resource strain: Not on file  . Food insecurity:    Worry: Not on file    Inability: Not on file  . Transportation needs:    Medical: Not on file    Non-medical: Not on file  Tobacco Use  . Smoking status: Never Smoker  . Smokeless tobacco: Never Used  Substance and Sexual Activity  . Alcohol use: No  . Drug use: No  . Sexual activity: Not on file  Lifestyle  . Physical activity:    Days per week: Not on file    Minutes per session: Not on file  . Stress: Not on file  Relationships  . Social connections:    Talks on phone: Not on file    Gets together: Not on file    Attends religious service: Not on file    Active member of club or organization: Not on file    Attends meetings of clubs or organizations: Not on file    Relationship status: Not on file  . Intimate partner violence:    Fear of current or ex partner: Not on file    Emotionally abused: Not on file    Physically abused: Not on file    Forced sexual activity: Not on file  Other Topics Concern  . Not on file  Social History Narrative  . Not on file    Physical Exam  Constitutional: She is oriented to person, place, and time.  Cardiovascular: Normal rate and regular rhythm.  Pulmonary/Chest: Effort normal and breath sounds normal.  Abdominal: Soft.  Musculoskeletal: Normal range of motion. She exhibits no edema.  Neurological: She is alert and oriented to person, place, and time.  Skin: Skin is warm and dry.  Psychiatric: She has a normal mood and affect.        Future Appointments  Date Time Provider [place]  01/20/2018 10:00 AM [place] 2 [facility] None  02/18/2018  2:30 PM [facility] PA/NP [facility] None    BP 136/78 (BP Location: Right  Arm, Patient Position: Sitting, Cuff Size: Large)   Pulse 62   Resp 18   Wt 196 lb (88.9 kg)   SpO2 98%   BMI 35.85 kg/m   Weight yesterday- 194 lb Last visit weight- 197 lb  Ms Roeder was seen at home today and reported feeling generally well. She denied SOB while at rest though she becomes winded when she is active or "doing a lot of talking." She reported intermittent dizziness but  no different from her baseline. She denied orthopnea or headaches and says she has been compliant with her medications. She reported an A1c in the 7's at last check and she reports feeling good about this. She said her nephrologist at the [facility] reports her "kidneys are doing bad" but did not make any changes to her medications. Her medications were verified and her pillbox was refilled.   Time spent with patient: 68 minutes  [name], EMT 01/01/18  ACTION: Home visit completed Next visit planned for 2 weeks

## 2018-01-15 ENCOUNTER — Other Ambulatory Visit (HOSPITAL_COMMUNITY): Payer: Self-pay

## 2018-01-15 NOTE — Progress Notes (Signed)
Paramedicine Encounter    Patient ID: Michelle Andrade, female    DOB: [birth year], 57 y.o.   MRN: [id number]   Patient Care Team: Henderson Baltimore, [name] as PCP - General (Nurse Practitioner)  Patient Active Problem List   Diagnosis Date Noted  . AKI (acute kidney injury) ([place])   . Type 2 diabetes mellitus with stage 3 chronic kidney disease ([place]) 02/13/2017  . Acute on chronic systolic (congestive) heart failure ([place])   . CKD (chronic kidney disease), stage III ([place])   . Constipated   . CHF (congestive heart failure) ([place]) 02/09/2017  . Pulmonary vascular congestion 02/09/2017  . Hypertension   . Sleep apnea   . PAD (peripheral artery disease) ([place]) 01/12/2014    Current Outpatient Medications:  .  amiodarone (PACERONE) 200 MG tablet, Take 200 mg by mouth daily., Disp: , Rfl:  .  aspirin 81 MG chewable tablet, Chew 81 mg by mouth daily., Disp: , Rfl:  .  Calcium Carbonate-Vitamin D3 (CALCIUM 600/VITAMIN D) 600-400 MG-UNIT TABS, Take 1 tablet by mouth 2 (two) times daily., Disp: , Rfl:  .  carvedilol (COREG) 25 MG tablet, Take 12.5 mg by mouth 2 (two) times daily with a meal., Disp: , Rfl:  .  ferrous sulfate 325 (65 FE) MG tablet, Take 325 mg by mouth 3 (three) times daily. , Disp: , Rfl:  .  glipiZIDE (GLUCOTROL) 5 MG tablet, Take 2.5 mg by mouth daily before breakfast., Disp: , Rfl:  .  hydrALAZINE (APRESOLINE) 50 MG tablet, Take 50 mg by mouth 3 (three) times daily., Disp: , Rfl:  .  Investigational - Study Medication, Take 1 tablet by mouth 2 (two) times daily. Study name: Galactic Heart Failure Study Additional study details: Omecamtiv Mecarbil or Placebo, Disp: 1 each, Rfl: PRN .  isosorbide mononitrate (IMDUR) 60 MG 24 hr tablet, Take 1 tablet (60 mg total) by mouth daily., Disp: 30 tablet, Rfl: 0 .  loratadine (CLARITIN) 10 MG tablet, Take 10 mg by mouth daily., Disp: , Rfl:  .  Magnesium Oxide 420 MG TABS, Take 840 mg by mouth daily., Disp: , Rfl:  .  metolazone (ZAROXOLYN)  2.5 MG tablet, Take 1 tablet (2.5 mg total) by mouth once a week. Monday, Disp: 12 tablet, Rfl: 0 .  Multiple Vitamin (MULTIVITAMIN) tablet, Take 1 tablet by mouth daily., Disp: , Rfl:  .  omeprazole (PRILOSEC) 40 MG capsule, Take 40 mg by mouth daily., Disp: , Rfl:  .  potassium chloride (K-DUR,KLOR-CON) 10 MEQ tablet, Take 4 tablets (40 mEq total) by mouth 2 (two) times daily., Disp: 240 tablet, Rfl: 2 .  rOPINIRole (REQUIP) 0.5 MG tablet, Take 0.5 mg by mouth at bedtime. , Disp: , Rfl:  .  sertraline (ZOLOFT) 100 MG tablet, Take 150 mg by mouth daily., Disp: , Rfl:  .  simvastatin (ZOCOR) 20 MG tablet, Take 20 mg by mouth daily., Disp: , Rfl:  .  spironolactone (ALDACTONE) 25 MG tablet, Take 12.5 mg by mouth daily., Disp: , Rfl:  .  torsemide (DEMADEX) 20 MG tablet, Take 1 tablet (20 mg total) by mouth 2 (two) times daily., Disp: 120 tablet, Rfl: 3 .  diclofenac sodium (VOLTAREN) 1 % GEL, Apply 2 g topically 4 (four) times daily. (Patient not taking: Reported on 12/18/2017), Disp: 100 g, Rfl: 0 Allergies  Allergen Reactions  . Codeine Itching  . Gabapentin Itching  . Haldol [Haloperidol Lactate] Other (See Comments)    Dizziness, hallucinations  . Levofloxacin  Other reaction(s): Other (See Comments) Projectile vomiting   . Lisinopril Itching and Swelling  . Losartan Itching  . Morphine And Related Nausea And Vomiting  . Penicillins Nausea And Vomiting    "Vomiting, upset stomach, Headache" Has patient had a PCN reaction causing immediate rash, facial/tongue/throat swelling, SOB or lightheadedness with hypotension: No Has patient had a PCN reaction causing severe rash involving mucus membranes or skin necrosis: No Has patient had a PCN reaction that required hospitalization: No Has patient had a PCN reaction occurring within the last 10 years: No If all of the above answers are "NO", then may proceed with Cephalosporin use.       Social History   Socioeconomic History  .  Marital status: Single    Spouse name: Not on file  . Number of children: Not on file  . Years of education: Not on file  . Highest education level: Not on file  Occupational History  . Not on file  Social Needs  . Financial resource strain: Not on file  . Food insecurity:    Worry: Not on file    Inability: Not on file  . Transportation needs:    Medical: Not on file    Non-medical: Not on file  Tobacco Use  . Smoking status: Never Smoker  . Smokeless tobacco: Never Used  Substance and Sexual Activity  . Alcohol use: No  . Drug use: No  . Sexual activity: Not on file  Lifestyle  . Physical activity:    Days per week: Not on file    Minutes per session: Not on file  . Stress: Not on file  Relationships  . Social connections:    Talks on phone: Not on file    Gets together: Not on file    Attends religious service: Not on file    Active member of club or organization: Not on file    Attends meetings of clubs or organizations: Not on file    Relationship status: Not on file  . Intimate partner violence:    Fear of current or ex partner: Not on file    Emotionally abused: Not on file    Physically abused: Not on file    Forced sexual activity: Not on file  Other Topics Concern  . Not on file  Social History Narrative  . Not on file    Physical Exam      Future Appointments  Date Time Provider [place]  01/20/2018 10:00 AM [place] [facility] None  02/18/2018  2:30 PM [facility] PA/NP [facility] None    BP 130/82 (BP Location: Left Arm, Patient Position: Sitting, Cuff Size: Large)   Pulse 64   Resp 16   Wt 196 lb 12.8 oz (89.3 kg)   SpO2 98%   BMI 36.00 kg/m   Weight yesterday- 197 lb Last visit weight- 196 lb  Michelle Andrade was seen at home today and reported feeling generally well. She denied SOB, headache, dizziness or orthopnea. She reported being compliant with her medications which were verified and her pillbox was  refilled. She requested I ask the clinic why she has never been approached about surgical interventions regarding her heart. I will follow up with the clinic and get back with her in the coming days.   Time spent with patient: 43 minutes  [name], EMT 01/15/18  ACTION: Home visit completed Next visit planned for 2 weeks

## 2018-01-16 ENCOUNTER — Telehealth (HOSPITAL_COMMUNITY): Payer: Self-pay | Admitting: Surgery

## 2018-01-16 NOTE — Telephone Encounter (Signed)
Michelle Andrade voiced concerns to Yevonne Aline --HF Community Paramedic that she has not been offered any sort of "surgery" for her heart conditions.  Thedore Mins explained that not all heart issues are treatable with surgery and advised her to speak to provider at next appt. Regarding her treatment options.

## 2018-01-20 ENCOUNTER — Telehealth: Payer: Self-pay | Admitting: *Deleted

## 2018-01-20 VITALS — BP 112/65 | HR 70 | Wt 219.8 lb

## 2018-01-20 DIAGNOSIS — Z006 Encounter for examination for normal comparison and control in clinical research program: Secondary | ICD-10-CM

## 2018-01-20 NOTE — Telephone Encounter (Addendum)
Spoke with subject on the phone about weight gain from previous appointment.  Subject weight at home with no clothes on is 206lbs around 15:00.  Subject states that she is having her "usual" heart failure symptoms but does not feel that she is having an exacerbation.   Instructed subject to weigh herself first thing in the morning tomorrow.  Heart Failure RN to follow up with subject in the morning and will provide further instruction if needed.  In addition, educated patient about importance of weighing self daily.  Subject verbalized understanding.  Paramedicine to follow up with subject this week.  Subject also let me know that she was unable to find her boxes and pill sleeves for the missing 5 boxes.  Subject to return boxes if she is able to locate.  Reminded subject to start new boxes dispensed today.

## 2018-01-20 NOTE — Progress Notes (Signed)
Patient ID: Michelle Andrade  DOB: 02-15-1961  [name] presented to the [facility] for the Week 48 visit of the [facility].  No signs/symptoms of ACS since the last visit. Subject stated she fell today while gardening, mechanical fall only. Subject failed to return 5 boxes of IP.  Subject states compliance and that boxes are at home.  Will return next appointment. Educated subject to start new IP dispensed today.  Pills removed from subjects pill box and replaced with IP dispensed today.  Week 48 research activities completed without event.  [facility].  Weight noted to be increased from previous recording. Will follow up with patient.

## 2018-01-21 ENCOUNTER — Telehealth: Payer: Self-pay | Admitting: *Deleted

## 2018-01-21 NOTE — Telephone Encounter (Signed)
Patient called to let me know her weight today was 196.2 which is close to baseline.

## 2018-01-23 ENCOUNTER — Other Ambulatory Visit (HOSPITAL_COMMUNITY): Payer: Self-pay

## 2018-01-23 NOTE — Progress Notes (Signed)
Paramedicine Encounter    Patient ID: Michelle Andrade, female    DOB: 04/17/1961, 57 y.o.   MRN: [id number]   Patient Care Team: Henderson Baltimore, [name] as PCP - General (Nurse Practitioner)  Patient Active Problem List   Diagnosis Date Noted  . AKI (acute kidney injury) ([place])   . Type 2 diabetes mellitus with stage 3 chronic kidney disease ([place]) 02/13/2017  . Acute on chronic systolic (congestive) heart failure ([place])   . CKD (chronic kidney disease), stage III ([place])   . Constipated   . CHF (congestive heart failure) ([place]) 02/09/2017  . Pulmonary vascular congestion 02/09/2017  . Hypertension   . Sleep apnea   . PAD (peripheral artery disease) ([place]) 01/12/2014    Current Outpatient Medications:  .  amiodarone (PACERONE) 200 MG tablet, Take 200 mg by mouth daily., Disp: , Rfl:  .  aspirin 81 MG chewable tablet, Chew 81 mg by mouth daily., Disp: , Rfl:  .  Calcium Carbonate-Vitamin D3 (CALCIUM 600/VITAMIN D) 600-400 MG-UNIT TABS, Take 1 tablet by mouth 2 (two) times daily., Disp: , Rfl:  .  carvedilol (COREG) 25 MG tablet, Take 12.5 mg by mouth 2 (two) times daily with a meal., Disp: , Rfl:  .  ferrous sulfate 325 (65 FE) MG tablet, Take 325 mg by mouth 3 (three) times daily. , Disp: , Rfl:  .  glipiZIDE (GLUCOTROL) 5 MG tablet, Take 2.5 mg by mouth daily before breakfast., Disp: , Rfl:  .  hydrALAZINE (APRESOLINE) 50 MG tablet, Take 50 mg by mouth 3 (three) times daily., Disp: , Rfl:  .  Investigational - Study Medication, Take 1 tablet by mouth 2 (two) times daily. Study name: Galactic Heart Failure Study Additional study details: Omecamtiv Mecarbil or Placebo, Disp: 1 each, Rfl: PRN .  isosorbide mononitrate (IMDUR) 60 MG 24 hr tablet, Take 1 tablet (60 mg total) by mouth daily., Disp: 30 tablet, Rfl: 0 .  loratadine (CLARITIN) 10 MG tablet, Take 10 mg by mouth daily., Disp: , Rfl:  .  Magnesium Oxide 420 MG TABS, Take 840 mg by mouth daily., Disp: , Rfl:  .  metolazone (ZAROXOLYN)  2.5 MG tablet, Take 1 tablet (2.5 mg total) by mouth once a week. Monday, Disp: 12 tablet, Rfl: 0 .  Multiple Vitamin (MULTIVITAMIN) tablet, Take 1 tablet by mouth daily., Disp: , Rfl:  .  omeprazole (PRILOSEC) 40 MG capsule, Take 40 mg by mouth daily., Disp: , Rfl:  .  potassium chloride (K-DUR,KLOR-CON) 10 MEQ tablet, Take 4 tablets (40 mEq total) by mouth 2 (two) times daily., Disp: 240 tablet, Rfl: 2 .  rOPINIRole (REQUIP) 0.5 MG tablet, Take 0.5 mg by mouth at bedtime. , Disp: , Rfl:  .  sertraline (ZOLOFT) 100 MG tablet, Take 150 mg by mouth daily., Disp: , Rfl:  .  simvastatin (ZOCOR) 20 MG tablet, Take 20 mg by mouth daily., Disp: , Rfl:  .  spironolactone (ALDACTONE) 25 MG tablet, Take 12.5 mg by mouth daily., Disp: , Rfl:  .  torsemide (DEMADEX) 20 MG tablet, Take 1 tablet (20 mg total) by mouth 2 (two) times daily., Disp: 120 tablet, Rfl: 3 .  diclofenac sodium (VOLTAREN) 1 % GEL, Apply 2 g topically 4 (four) times daily. (Patient not taking: Reported on 12/18/2017), Disp: 100 g, Rfl: 0 Allergies  Allergen Reactions  . Codeine Itching  . Gabapentin Itching  . Haldol [Haloperidol Lactate] Other (See Comments)    Dizziness, hallucinations  . Levofloxacin  Other reaction(s): Other (See Comments) Projectile vomiting   . Lisinopril Itching and Swelling  . Losartan Itching  . Morphine And Related Nausea And Vomiting  . Penicillins Nausea And Vomiting    "Vomiting, upset stomach, Headache" Has patient had a PCN reaction causing immediate rash, facial/tongue/throat swelling, SOB or lightheadedness with hypotension: No Has patient had a PCN reaction causing severe rash involving mucus membranes or skin necrosis: No Has patient had a PCN reaction that required hospitalization: No Has patient had a PCN reaction occurring within the last 10 years: No If all of the above answers are "NO", then may proceed with Cephalosporin use.       Social History   Socioeconomic History  .  Marital status: Single    Spouse name: Not on file  . Number of children: Not on file  . Years of education: Not on file  . Highest education level: Not on file  Occupational History  . Not on file  Social Needs  . Financial resource strain: Not on file  . Food insecurity:    Worry: Not on file    Inability: Not on file  . Transportation needs:    Medical: Not on file    Non-medical: Not on file  Tobacco Use  . Smoking status: Never Smoker  . Smokeless tobacco: Never Used  Substance and Sexual Activity  . Alcohol use: No  . Drug use: No  . Sexual activity: Not on file  Lifestyle  . Physical activity:    Days per week: Not on file    Minutes per session: Not on file  . Stress: Not on file  Relationships  . Social connections:    Talks on phone: Not on file    Gets together: Not on file    Attends religious service: Not on file    Active member of club or organization: Not on file    Attends meetings of clubs or organizations: Not on file    Relationship status: Not on file  . Intimate partner violence:    Fear of current or ex partner: Not on file    Emotionally abused: Not on file    Physically abused: Not on file    Forced sexual activity: Not on file  Other Topics Concern  . Not on file  Social History Narrative  . Not on file    Physical Exam  Constitutional: She is oriented to person, place, and time.  Cardiovascular: Normal rate and regular rhythm.  Pulmonary/Chest: Effort normal and breath sounds normal.  Abdominal: Soft. She exhibits no distension.  Musculoskeletal: Normal range of motion. She exhibits no edema.  Neurological: She is alert and oriented to person, place, and time.  Skin: Skin is warm and dry.  Psychiatric: She has a normal mood and affect.        Future Appointments  Date Time Provider [place]  02/18/2018  2:30 PM [facility] PA/NP [facility] None  05/02/2018 10:00 AM [place] [facility] None    BP  116/68 (BP Location: Left Arm, Patient Position: Sitting, Cuff Size: Large)   Pulse 76   Resp 16   Wt 196 lb 6.4 oz (89.1 kg)   SpO2 94%   BMI 35.92 kg/m   Weight yesterday- 196.4 lb Last visit weight- 196 lb  Ms Garr was seen at home today and reported feeling well. She denied SOB, headache, or increased dizziness. She had been compliant with her medications and stated her weight was down.  Her medications were verified and her pillbox was refilled. I advised her that I would be out of town next week so if she needs anything, she should call the clinic and either [place] or Jo[facility] [place] would come see her. She was understanding and agreeable.  Time spent with patient: 43 minutes  [name], EMT 01/23/18  ACTION: Home visit completed Next visit planned for 2 weeks

## 2018-02-05 ENCOUNTER — Telehealth (HOSPITAL_COMMUNITY): Payer: Self-pay

## 2018-02-05 NOTE — Telephone Encounter (Signed)
I called Michelle Andrade to schedule an appointment. She stated that she was busy all day today but was going to be at the support group tomorrow and asked to meet me there to complete an assessment and fill her pillboxes. We agreed on this time and place.

## 2018-02-07 ENCOUNTER — Other Ambulatory Visit (HOSPITAL_COMMUNITY): Payer: Self-pay

## 2018-02-07 NOTE — Progress Notes (Signed)
Paramedicine Encounter    Patient ID: Michelle Andrade, female    DOB: May 06, 1961, 57 y.o.   MRN: [id number]   Patient Care Team: Henderson Baltimore, [name] as PCP - General (Nurse Practitioner)  Patient Active Problem List   Diagnosis Date Noted  . AKI (acute kidney injury) ([place])   . Type 2 diabetes mellitus with stage 3 chronic kidney disease ([place]) 02/13/2017  . Acute on chronic systolic (congestive) heart failure ([place])   . CKD (chronic kidney disease), stage III ([place])   . Constipated   . CHF (congestive heart failure) ([place]) 02/09/2017  . Pulmonary vascular congestion 02/09/2017  . Hypertension   . Sleep apnea   . PAD (peripheral artery disease) ([place]) 01/12/2014    Current Outpatient Medications:  .  amiodarone (PACERONE) 200 MG tablet, Take 200 mg by mouth daily., Disp: , Rfl:  .  aspirin 81 MG chewable tablet, Chew 81 mg by mouth daily., Disp: , Rfl:  .  Calcium Carbonate-Vitamin D3 (CALCIUM 600/VITAMIN D) 600-400 MG-UNIT TABS, Take 1 tablet by mouth 2 (two) times daily., Disp: , Rfl:  .  carvedilol (COREG) 25 MG tablet, Take 12.5 mg by mouth 2 (two) times daily with a meal., Disp: , Rfl:  .  ferrous sulfate 325 (65 FE) MG tablet, Take 325 mg by mouth 3 (three) times daily. , Disp: , Rfl:  .  glipiZIDE (GLUCOTROL) 5 MG tablet, Take 2.5 mg by mouth daily before breakfast., Disp: , Rfl:  .  hydrALAZINE (APRESOLINE) 50 MG tablet, Take 50 mg by mouth 3 (three) times daily., Disp: , Rfl:  .  Investigational - Study Medication, Take 1 tablet by mouth 2 (two) times daily. Study name: Galactic Heart Failure Study Additional study details: Omecamtiv Mecarbil or Placebo, Disp: 1 each, Rfl: PRN .  isosorbide mononitrate (IMDUR) 60 MG 24 hr tablet, Take 1 tablet (60 mg total) by mouth daily., Disp: 30 tablet, Rfl: 0 .  loratadine (CLARITIN) 10 MG tablet, Take 10 mg by mouth daily., Disp: , Rfl:  .  Magnesium Oxide 420 MG TABS, Take 840 mg by mouth daily., Disp: , Rfl:  .  metolazone (ZAROXOLYN)  2.5 MG tablet, Take 1 tablet (2.5 mg total) by mouth once a week. Monday, Disp: 12 tablet, Rfl: 0 .  Multiple Vitamin (MULTIVITAMIN) tablet, Take 1 tablet by mouth daily., Disp: , Rfl:  .  omeprazole (PRILOSEC) 40 MG capsule, Take 40 mg by mouth daily., Disp: , Rfl:  .  potassium chloride (K-DUR,KLOR-CON) 10 MEQ tablet, Take 4 tablets (40 mEq total) by mouth 2 (two) times daily., Disp: 240 tablet, Rfl: 2 .  rOPINIRole (REQUIP) 0.5 MG tablet, Take 0.5 mg by mouth at bedtime. , Disp: , Rfl:  .  sertraline (ZOLOFT) 100 MG tablet, Take 150 mg by mouth daily., Disp: , Rfl:  .  simvastatin (ZOCOR) 20 MG tablet, Take 20 mg by mouth daily., Disp: , Rfl:  .  spironolactone (ALDACTONE) 25 MG tablet, Take 12.5 mg by mouth daily., Disp: , Rfl:  .  torsemide (DEMADEX) 20 MG tablet, Take 1 tablet (20 mg total) by mouth 2 (two) times daily., Disp: 120 tablet, Rfl: 3 .  diclofenac sodium (VOLTAREN) 1 % GEL, Apply 2 g topically 4 (four) times daily. (Patient not taking: Reported on 12/18/2017), Disp: 100 g, Rfl: 0 Allergies  Allergen Reactions  . Codeine Itching  . Gabapentin Itching  . Haldol [Haloperidol Lactate] Other (See Comments)    Dizziness, hallucinations  . Levofloxacin  Other reaction(s): Other (See Comments) Projectile vomiting   . Lisinopril Itching and Swelling  . Losartan Itching  . Morphine And Related Nausea And Vomiting  . Penicillins Nausea And Vomiting    "Vomiting, upset stomach, Headache" Has patient had a PCN reaction causing immediate rash, facial/tongue/throat swelling, SOB or lightheadedness with hypotension: No Has patient had a PCN reaction causing severe rash involving mucus membranes or skin necrosis: No Has patient had a PCN reaction that required hospitalization: No Has patient had a PCN reaction occurring within the last 10 years: No If all of the above answers are "NO", then may proceed with Cephalosporin use.       Social History   Socioeconomic History  .  Marital status: Single    Spouse name: Not on file  . Number of children: Not on file  . Years of education: Not on file  . Highest education level: Not on file  Occupational History  . Not on file  Social Needs  . Financial resource strain: Not on file  . Food insecurity:    Worry: Not on file    Inability: Not on file  . Transportation needs:    Medical: Not on file    Non-medical: Not on file  Tobacco Use  . Smoking status: Never Smoker  . Smokeless tobacco: Never Used  Substance and Sexual Activity  . Alcohol use: No  . Drug use: No  . Sexual activity: Not on file  Lifestyle  . Physical activity:    Days per week: Not on file    Minutes per session: Not on file  . Stress: Not on file  Relationships  . Social connections:    Talks on phone: Not on file    Gets together: Not on file    Attends religious service: Not on file    Active member of club or organization: Not on file    Attends meetings of clubs or organizations: Not on file    Relationship status: Not on file  . Intimate partner violence:    Fear of current or ex partner: Not on file    Emotionally abused: Not on file    Physically abused: Not on file    Forced sexual activity: Not on file  Other Topics Concern  . Not on file  Social History Narrative  . Not on file    Physical Exam  Constitutional: She is oriented to person, place, and time.  Cardiovascular: Normal rate and regular rhythm.  Pulmonary/Chest: Breath sounds normal. She is in respiratory distress.  Abdominal: She exhibits distension.  Musculoskeletal: Normal range of motion. She exhibits no edema.  Neurological: She is alert and oriented to person, place, and time.  Skin: Skin is warm and dry.  Psychiatric: She has a normal mood and affect.        Future Appointments  Date Time Provider [place]  02/18/2018  2:30 PM [facility] PA/NP [facility] None  05/02/2018 10:00 AM [name] [facility] None     BP 120/68 (BP Location: Left Arm, Patient Position: Sitting, Cuff Size: Large)   Pulse 72   Resp 18   Wt 196 lb 3.2 oz (89 kg)   SpO2 97%   BMI 35.89 kg/m   Weight yesterday- did not weigh Last visit weight- 196.4 lb  Michelle Andrade was seen at home today. She reported feeling like she has a cold and has been taking "diabetic tussin DM." She was also having increased conversational dyspnea which  she reported as a result of having a cold. I explained that she should avoid taking OTC medications that are labeled as decongestants because they could increase her blood pressure and result in stress on her heart muscle. She said she understood and would stop taking it. I will bring her a list of HF approved OTC medications for treating a cold. Her medications were verified and her pillboxes were refilled. I encouraged he to call me or the clinic if she did not begin to have resolution of her dyspnea over the weekend. She was understanding and agreeable.   Time spent with patient: 52 minutes  [name], EMT 02/07/18  ACTION: Home visit completed Next visit planned for 2 weeks

## 2018-02-18 ENCOUNTER — Encounter (HOSPITAL_COMMUNITY): Payer: Medicare Other

## 2018-02-19 ENCOUNTER — Other Ambulatory Visit (HOSPITAL_COMMUNITY): Payer: Self-pay

## 2018-02-19 NOTE — Progress Notes (Signed)
Paramedicine Encounter    Patient ID: Michelle Andrade, female    DOB: 1961-08-11, 57 y.o.   MRN: [id number]   Patient Care Team: Henderson Baltimore, [name] as PCP - General (Nurse Practitioner)  Patient Active Problem List   Diagnosis Date Noted  . AKI (acute kidney injury) ([place])   . Type 2 diabetes mellitus with stage 3 chronic kidney disease ([place]) 02/13/2017  . Acute on chronic systolic (congestive) heart failure ([place])   . CKD (chronic kidney disease), stage III ([place])   . Constipated   . CHF (congestive heart failure) ([place]) 02/09/2017  . Pulmonary vascular congestion 02/09/2017  . Hypertension   . Sleep apnea   . PAD (peripheral artery disease) ([place]) 01/12/2014    Current Outpatient Medications:  .  amiodarone (PACERONE) 200 MG tablet, Take 200 mg by mouth daily., Disp: , Rfl:  .  aspirin 81 MG chewable tablet, Chew 81 mg by mouth daily., Disp: , Rfl:  .  Calcium Carbonate-Vitamin D3 (CALCIUM 600/VITAMIN D) 600-400 MG-UNIT TABS, Take 1 tablet by mouth 2 (two) times daily., Disp: , Rfl:  .  carvedilol (COREG) 25 MG tablet, Take 12.5 mg by mouth 2 (two) times daily with a meal., Disp: , Rfl:  .  ferrous sulfate 325 (65 FE) MG tablet, Take 325 mg by mouth 3 (three) times daily. , Disp: , Rfl:  .  glipiZIDE (GLUCOTROL) 5 MG tablet, Take 2.5 mg by mouth daily before breakfast., Disp: , Rfl:  .  hydrALAZINE (APRESOLINE) 50 MG tablet, Take 50 mg by mouth 3 (three) times daily., Disp: , Rfl:  .  Investigational - Study Medication, Take 1 tablet by mouth 2 (two) times daily. Study name: Galactic Heart Failure Study Additional study details: Omecamtiv Mecarbil or Placebo, Disp: 1 each, Rfl: PRN .  isosorbide mononitrate (IMDUR) 60 MG 24 hr tablet, Take 1 tablet (60 mg total) by mouth daily., Disp: 30 tablet, Rfl: 0 .  loratadine (CLARITIN) 10 MG tablet, Take 10 mg by mouth daily., Disp: , Rfl:  .  Magnesium Oxide 420 MG TABS, Take 840 mg by mouth daily., Disp: , Rfl:  .  metolazone (ZAROXOLYN)  2.5 MG tablet, Take 1 tablet (2.5 mg total) by mouth once a week. Monday, Disp: 12 tablet, Rfl: 0 .  Multiple Vitamin (MULTIVITAMIN) tablet, Take 1 tablet by mouth daily., Disp: , Rfl:  .  omeprazole (PRILOSEC) 40 MG capsule, Take 40 mg by mouth daily., Disp: , Rfl:  .  potassium chloride (K-DUR,KLOR-CON) 10 MEQ tablet, Take 4 tablets (40 mEq total) by mouth 2 (two) times daily., Disp: 240 tablet, Rfl: 2 .  rOPINIRole (REQUIP) 0.5 MG tablet, Take 0.5 mg by mouth at bedtime. , Disp: , Rfl:  .  sertraline (ZOLOFT) 100 MG tablet, Take 150 mg by mouth daily., Disp: , Rfl:  .  simvastatin (ZOCOR) 20 MG tablet, Take 20 mg by mouth daily., Disp: , Rfl:  .  spironolactone (ALDACTONE) 25 MG tablet, Take 12.5 mg by mouth daily., Disp: , Rfl:  .  torsemide (DEMADEX) 20 MG tablet, Take 1 tablet (20 mg total) by mouth 2 (two) times daily., Disp: 120 tablet, Rfl: 3 .  diclofenac sodium (VOLTAREN) 1 % GEL, Apply 2 g topically 4 (four) times daily. (Patient not taking: Reported on 12/18/2017), Disp: 100 g, Rfl: 0 Allergies  Allergen Reactions  . Codeine Itching  . Gabapentin Itching  . Haldol [Haloperidol Lactate] Other (See Comments)    Dizziness, hallucinations  . Levofloxacin  Other reaction(s): Other (See Comments) Projectile vomiting   . Lisinopril Itching and Swelling  . Losartan Itching  . Morphine And Related Nausea And Vomiting  . Penicillins Nausea And Vomiting    "Vomiting, upset stomach, Headache" Has patient had a PCN reaction causing immediate rash, facial/tongue/throat swelling, SOB or lightheadedness with hypotension: No Has patient had a PCN reaction causing severe rash involving mucus membranes or skin necrosis: No Has patient had a PCN reaction that required hospitalization: No Has patient had a PCN reaction occurring within the last 10 years: No If all of the above answers are "NO", then may proceed with Cephalosporin use.       Social History   Socioeconomic History  .  Marital status: Single    Spouse name: Not on file  . Number of children: Not on file  . Years of education: Not on file  . Highest education level: Not on file  Occupational History  . Not on file  Social Needs  . Financial resource strain: Not on file  . Food insecurity:    Worry: Not on file    Inability: Not on file  . Transportation needs:    Medical: Not on file    Non-medical: Not on file  Tobacco Use  . Smoking status: Never Smoker  . Smokeless tobacco: Never Used  Substance and Sexual Activity  . Alcohol use: No  . Drug use: No  . Sexual activity: Not on file  Lifestyle  . Physical activity:    Days per week: Not on file    Minutes per session: Not on file  . Stress: Not on file  Relationships  . Social connections:    Talks on phone: Not on file    Gets together: Not on file    Attends religious service: Not on file    Active member of club or organization: Not on file    Attends meetings of clubs or organizations: Not on file    Relationship status: Not on file  . Intimate partner violence:    Fear of current or ex partner: Not on file    Emotionally abused: Not on file    Physically abused: Not on file    Forced sexual activity: Not on file  Other Topics Concern  . Not on file  Social History Narrative  . Not on file    Physical Exam  Constitutional: She is oriented to person, place, and time.  Cardiovascular: Normal rate and regular rhythm.  Pulmonary/Chest: Effort normal and breath sounds normal.  Abdominal: Soft. Bowel sounds are normal.  Musculoskeletal: Normal range of motion. She exhibits no edema.  Neurological: She is alert and oriented to person, place, and time.  Skin: Skin is warm and dry.  Psychiatric: She has a normal mood and affect.        Future Appointments  Date Time Provider [place]  03/05/2018  2:30 PM [facility] PA/NP [facility] None  05/02/2018 10:00 AM [place] [facility] None    BP  116/72 (BP Location: Left Arm, Patient Position: Sitting, Cuff Size: Large)   Pulse 98   Resp 18   Wt 194 lb (88 kg)   SpO2 96%   BMI 35.48 kg/m   Weight yesterday- 196 lb Last visit weight- 196.2 lb  Ms Scogin was seen at home today and reported feeling well. She denied SOB, headache, dizziness or orthopnea. She reported being compliant with her medications which was verified but having no missed doses  in her pillbox. He medications were verified and her pillbox was refilled.   [name], EMT 02/19/18  ACTION: Home visit completed Next visit planned for 2 weeks

## 2018-03-04 ENCOUNTER — Telehealth (HOSPITAL_COMMUNITY): Payer: Self-pay

## 2018-03-04 NOTE — Progress Notes (Signed)
Advanced Heart Failure Clinic Note   Primary Cardiologist: Dr. Haroldine Laws  Nephrology: Dr Valaria Good [facility].   HPI:  Michelle Andrade is a 57 year old female with a past medical history of HTN, chronic systolic CHF s/p BI V ICD ([employer]),atrial tachycardia (2017), NICM (normal cors in 2014), CKD stage III, HLD, DM, OSA w/CPAP, and anemia.  She was admitted 12/26/16-12/27/16 with volume overload at [facility]. Echo that admission with EF 25-30%. She was discharged on Spiro 25mg  daily, Coreg 25mg  BID,and hydralazine 12.5mg  daily. Discharge weight was 177 pounds.   Her baseline creatinine appears to be 1.4-1.6 when reviewing her records from [facility]. Her last office visit with her Cardiologist Dr. Otho Perl was in January 2018 . He noted angioedema with ACE-I and cough with ARB.   She presented to the ED on 02/09/17 with SOB and abdominal bloating. Started on IV Lasix. RHC done and showed Fick output/index 4.5/2.5. PVR 3.8 WU. Discharge weight was 176 pounds.  Went to the ED on 03/15/17 with dizziness, orthostasis. She had taken an extra Lasix that day.   Today she returns for HF follow up. Overall feeling fine. Says she is going eat what ever she wants. Denies PND/Orthopnea. SOB with exertion. No chest pain.  Appetite ok. Eating pizza. No fever or chills. Weight at home up a little. Taking all medications. Followed by Paramedicine.    Review of systems complete and found to be negative unless listed in HPI.    Past Medical History:  Diagnosis Date  . Arthritis 02/08/2017  . 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 CAD (coronary artery disease) 09/01/2013  . CHF (congestive heart failure) ([place]) 06/07/2015  . CKD (chronic kidney disease), stage III ([place]) 12/09/2013  . Depression 07/27/2013  . Diabetes mellitus without complication ([place]) 58/85/0277  . GERD (gastroesophageal reflux disease) 07/27/2013  . Gout 12/29/2013  . Heart  valve disorder 06/03/2013  . High cholesterol 11/10/2013  . Hypertension 11/10/2013  . IBS (irritable bowel syndrome) 09/27/2014  . Myocardial infarct ([place])   . Sleep apnea 10/13/2013  . Vascular disorder 01/12/2014    Current Outpatient Medications  Medication Sig Dispense Refill  . amiodarone (PACERONE) 200 MG tablet Take 200 mg by mouth daily.    [name] aspirin 81 MG chewable tablet Chew 81 mg by mouth daily.    . Calcium Carbonate-Vitamin D3 (CALCIUM 600/VITAMIN D) 600-400 MG-UNIT TABS Take 1 tablet by mouth 2 (two) times daily.    . carvedilol (COREG) 25 MG tablet Take 12.5 mg by mouth 2 (two) times daily with a meal.    . diclofenac sodium (VOLTAREN) 1 % GEL Apply 2 g topically 4 (four) times daily. (Patient not taking: Reported on 12/18/2017) 100 g 0  . ferrous sulfate 325 (65 FE) MG tablet Take 325 mg by mouth 3 (three) times daily.     [name] glipiZIDE (GLUCOTROL) 5 MG tablet Take 2.5 mg by mouth daily before breakfast.    . hydrALAZINE (APRESOLINE) 50 MG tablet Take 50 mg by mouth 3 (three) times daily.    . Investigational - Study Medication Take 1 tablet by mouth 2 (two) times daily. Study name: Galactic Heart Failure Study Additional study details: Omecamtiv Mecarbil or Placebo 1 each PRN  . isosorbide mononitrate (IMDUR) 60 MG 24 hr tablet Take 1 tablet (60 mg total) by mouth daily. 30 tablet 0  . loratadine (CLARITIN) 10 MG tablet Take 10 mg by mouth daily.    . Magnesium  Oxide 420 MG TABS Take 840 mg by mouth daily.    . metolazone (ZAROXOLYN) 2.5 MG tablet Take 1 tablet (2.5 mg total) by mouth once a week. Monday 12 tablet 0  . Multiple Vitamin (MULTIVITAMIN) tablet Take 1 tablet by mouth daily.    [name] omeprazole (PRILOSEC) 40 MG capsule Take 40 mg by mouth daily.    . potassium chloride (K-DUR,KLOR-CON) 10 MEQ tablet Take 4 tablets (40 mEq total) by mouth 2 (two) times daily. 240 tablet 2  . rOPINIRole (REQUIP) 0.5 MG tablet Take 0.5 mg by mouth at bedtime.     . sertraline (ZOLOFT)  100 MG tablet Take 150 mg by mouth daily.    . simvastatin (ZOCOR) 20 MG tablet Take 20 mg by mouth daily.    [name] spironolactone (ALDACTONE) 25 MG tablet Take 12.5 mg by mouth daily.    [name] torsemide (DEMADEX) 20 MG tablet Take 1 tablet (20 mg total) by mouth 2 (two) times daily. 120 tablet 3   No current facility-administered medications for this encounter.    Allergies  Allergen Reactions  . Codeine Itching  . Gabapentin Itching  . Haldol [Haloperidol Lactate] Other (See Comments)    Dizziness, hallucinations  . Levofloxacin     Other reaction(s): Other (See Comments) Projectile vomiting   . Lisinopril Itching and Swelling  . Losartan Itching  . Morphine And Related Nausea And Vomiting  . Penicillins Nausea And Vomiting    "Vomiting, upset stomach, Headache" Has patient had a PCN reaction causing immediate rash, facial/tongue/throat swelling, SOB or lightheadedness with hypotension: No Has patient had a PCN reaction causing severe rash involving mucus membranes or skin necrosis: No Has patient had a PCN reaction that required hospitalization: No Has patient had a PCN reaction occurring within the last 10 years: No If all of the above answers are "NO", then may proceed with Cephalosporin use.    Social History   Socioeconomic History  . Marital status: Single    Spouse name: Not on file  . Number of children: Not on file  . Years of education: Not on file  . Highest education level: Not on file  Occupational History  . Not on file  Social Needs  . Financial resource strain: Not on file  . Food insecurity:    Worry: Not on file    Inability: Not on file  . Transportation needs:    Medical: Not on file    Non-medical: Not on file  Tobacco Use  . Smoking status: Never Smoker  . Smokeless tobacco: Never Used  Substance and Sexual Activity  . Alcohol use: No  . Drug use: No  . Sexual activity: Not on file  Lifestyle  . Physical activity:    Days per week: Not on file     Minutes per session: Not on file  . Stress: Not on file  Relationships  . Social connections:    Talks on phone: Not on file    Gets together: Not on file    Attends religious service: Not on file    Active member of club or organization: Not on file    Attends meetings of clubs or organizations: Not on file    Relationship status: Not on file  . Intimate partner violence:    Fear of current or ex partner: Not on file    Emotionally abused: Not on file    Physically abused: Not on file    Forced sexual activity: Not on file  Other Topics Concern  . Not on file  Social History Narrative  . Not on file   Family History  Problem Relation Age of Onset  . Hypertension Mother    There were no vitals filed for this visit. Wt Readings from Last 3 Encounters:  02/19/18 194 lb (88 kg)  02/07/18 196 lb 3.2 oz (89 kg)  01/23/18 196 lb 6.4 oz (89.1 kg)    PHYSICAL EXAM: General:  Well appearing. No resp difficulty HEENT: normal Neck: supple. JVP 5-6. Carotids 2+ bilat; no bruits. No lymphadenopathy or thryomegaly appreciated. Cor: PMI nondisplaced. Regular rate & rhythm. No rubs, gallops or murmurs. Lungs: clear Abdomen: obese, soft, nontender, nondistended. No hepatosplenomegaly. No bruits or masses. Good bowel sounds. Extremities: no cyanosis, clubbing, rash, edema Neuro: alert & orientedx3, cranial nerves grossly intact. moves all 4 extremities w/o difficulty. Affect pleasant   ASSESSMENT & PLAN: 1. Chronic systolic CHF: NICM, normal cors in 2014, likely related to HTN vs. Noncompliance vs. Tachy - medicated with history of atrial tach. [employer] Jude  BiV - Echo 02/2017 EF 20%, moderate MR, moderate TR. PA pressure 80 mm Hg.   -ECHO 08/07/2017 EF 20-25%. Repeat ECHO -NYHA IIIb.   Volume status stable.  Continue torsemide 40 mg BID.  - Continue metolazone 2.5 mg weekly.  - Continue Spiro 12.5  mg qhs. - Continue isosorbide 60 mg daily - Continue hydralazine 25 mg TID - Continue Coreg  12.5 mg BID.  -No arb/dig with ckd.     2. CKD stage III: Baseline creatinine 1.3-1.6.  I reviewed BMET from 6/12/12019 Creatine 1.17 Followed by Nephrology/    3. BiV ICD  St Jude .   4. HTN - Stable.    5. History of atrial tachycardia - On Amiodarone 200 mg daily - Recent LFTs stable from PCP    6. Gastric polyp  - had EGD on 04/02/17 found to have gastric polyp that was biopsied.  [facility]    Follow up with Dr Haroldine Laws in 3 months with an ECHO. Reinforced low salt food choices.   She will meet with SW with [facility]ose for [place].     Darrick Grinder, NP 03/05/18

## 2018-03-04 NOTE — Telephone Encounter (Signed)
I called Michelle Andrade to schedule an appointment. She advised that she has an appointment at the clinic on Wednesday and asked me to meet her there. While on the phone she stated she had experienced some weight gain, was feeling like her abdomen was distended and she was mildly SOB. She reported eating poorly over the past several days which she says led to this. She reported taking an extra metolazone this morning to help remove the fluid. I contacted the HF clinic who advised that no extra medications were indicated and to have her track her weight gain or loss until her appointment on Wednesday. Michelle Andrade was understanding and agreeable.

## 2018-03-05 ENCOUNTER — Encounter (HOSPITAL_COMMUNITY): Payer: Self-pay

## 2018-03-05 ENCOUNTER — Other Ambulatory Visit (HOSPITAL_COMMUNITY): Payer: Self-pay

## 2018-03-05 ENCOUNTER — Ambulatory Visit (HOSPITAL_COMMUNITY)
Admission: RE | Admit: 2018-03-05 | Discharge: 2018-03-05 | Disposition: A | Discharge status: Home / Self Care | Payer: Medicare Other | Source: Ambulatory Visit | Attending: Internal Medicine | Admitting: Internal Medicine

## 2018-03-05 VITALS — BP 126/82 | HR 102 | Wt 200.4 lb

## 2018-03-05 DIAGNOSIS — N183 Chronic kidney disease, stage 3 unspecified: Secondary | ICD-10-CM

## 2018-03-05 DIAGNOSIS — Z880 Allergy status to penicillin: Secondary | ICD-10-CM | POA: Diagnosis not present

## 2018-03-05 DIAGNOSIS — I5022 Chronic systolic (congestive) heart failure: Secondary | ICD-10-CM

## 2018-03-05 DIAGNOSIS — K219 Gastro-esophageal reflux disease without esophagitis: Secondary | ICD-10-CM | POA: Insufficient documentation

## 2018-03-05 DIAGNOSIS — I471 Supraventricular tachycardia: Secondary | ICD-10-CM | POA: Diagnosis not present

## 2018-03-05 DIAGNOSIS — I10 Essential (primary) hypertension: Secondary | ICD-10-CM | POA: Diagnosis not present

## 2018-03-05 DIAGNOSIS — Z888 Allergy status to other drugs, medicaments and biological substances status: Secondary | ICD-10-CM | POA: Insufficient documentation

## 2018-03-05 DIAGNOSIS — E1122 Type 2 diabetes mellitus with diabetic chronic kidney disease: Secondary | ICD-10-CM | POA: Insufficient documentation

## 2018-03-05 DIAGNOSIS — Z885 Allergy status to narcotic agent status: Secondary | ICD-10-CM | POA: Insufficient documentation

## 2018-03-05 DIAGNOSIS — F329 Major depressive disorder, single episode, unspecified: Secondary | ICD-10-CM | POA: Diagnosis not present

## 2018-03-05 DIAGNOSIS — Z8249 Family history of ischemic heart disease and other diseases of the circulatory system: Secondary | ICD-10-CM | POA: Insufficient documentation

## 2018-03-05 DIAGNOSIS — I428 Other cardiomyopathies: Secondary | ICD-10-CM | POA: Diagnosis not present

## 2018-03-05 DIAGNOSIS — Z7984 Long term (current) use of oral hypoglycemic drugs: Secondary | ICD-10-CM | POA: Insufficient documentation

## 2018-03-05 DIAGNOSIS — Z7982 Long term (current) use of aspirin: Secondary | ICD-10-CM | POA: Diagnosis not present

## 2018-03-05 DIAGNOSIS — Z95810 Presence of automatic (implantable) cardiac defibrillator: Secondary | ICD-10-CM | POA: Insufficient documentation

## 2018-03-05 DIAGNOSIS — Z79899 Other long term (current) drug therapy: Secondary | ICD-10-CM | POA: Insufficient documentation

## 2018-03-05 DIAGNOSIS — I130 Hypertensive heart and chronic kidney disease with heart failure and stage 1 through stage 4 chronic kidney disease, or unspecified chronic kidney disease: Secondary | ICD-10-CM | POA: Diagnosis not present

## 2018-03-05 DIAGNOSIS — I2510 Atherosclerotic heart disease of native coronary artery without angina pectoris: Secondary | ICD-10-CM | POA: Diagnosis not present

## 2018-03-05 DIAGNOSIS — K317 Polyp of stomach and duodenum: Secondary | ICD-10-CM | POA: Insufficient documentation

## 2018-03-05 LAB — BASIC METABOLIC PANEL
Anion gap: 12 (ref 5–15)
BUN: 30 mg/dL — ABNORMAL HIGH (ref 6–20)
CHLORIDE: 97 mmol/L — AB (ref 98–111)
CO2: 27 mmol/L (ref 22–32)
CREATININE: 2.24 mg/dL — AB (ref 0.44–1.00)
Calcium: 8.8 mg/dL — ABNORMAL LOW (ref 8.9–10.3)
GFR calc non Af Amer: 23 mL/min — ABNORMAL LOW (ref >60)
GFR, EST AFRICAN AMERICAN: 26 mL/min — AB (ref >60)
Glucose, Bld: 354 mg/dL — ABNORMAL HIGH (ref 70–99)
POTASSIUM: 3.6 mmol/L (ref 3.5–5.1)
SODIUM: 136 mmol/L (ref 135–145)

## 2018-03-05 NOTE — Patient Instructions (Signed)
Routine lab work today. Will notify you of abnormal results, otherwise no news is good news!  Follow up 3 months with echocardiogram and appointment with Dr. Haroldine Laws. We will call you closer to this time, or you may call our office to schedule 1 month before you are due to be seen. Take all medication as prescribed the day of your appointment. Bring all medications with you to your appointment.  Do the following things EVERYDAY: 1) Weigh yourself in the morning before breakfast. Write it down and keep it in a log. 2) Take your medicines as prescribed 3) Eat low salt foods-Limit salt (sodium) to 2000 mg per day.  4) Stay as active as you can everyday 5) Limit all fluids for the day to less than 2 liters

## 2018-03-05 NOTE — Progress Notes (Signed)
Paramedicine Encounter   Patient ID: Michelle Andrade , female,   DOB: 1961/08/16,57 y.o.,  MRN: [id number]  Michelle Andrade was seen in the HF clinic today with Darrick Grinder, NP-C. She reported feeling well and being compliant with her medications. Amy did not make any changes so I refilled a pillbox. She did not bring any extra medications with her so I gave her written instructions on where to put isosorbide and her investigational medication.   [name], EMT 03/05/2018   ACTION: Next visit planned for 1 week

## 2018-03-05 NOTE — Progress Notes (Deleted)
Paramedicine Encounter    Patient ID: Michelle Andrade, female    DOB: 05/20/1961, 57 y.o.   MRN: [id number]   Patient Care Team: Henderson Baltimore, [name] as PCP - General (Nurse Practitioner)  Patient Active Problem List   Diagnosis Date Noted  . AKI (acute kidney injury) ([place])   . Type 2 diabetes mellitus with stage 3 chronic kidney disease ([place]) 02/13/2017  . Acute on chronic systolic (congestive) heart failure ([place])   . CKD (chronic kidney disease), stage III ([place])   . Constipated   . CHF (congestive heart failure) ([place]) 02/09/2017  . Pulmonary vascular congestion 02/09/2017  . Hypertension   . Sleep apnea   . PAD (peripheral artery disease) ([place]) 01/12/2014    Current Outpatient Medications:  .  amiodarone (PACERONE) 200 MG tablet, Take 200 mg by mouth daily., Disp: , Rfl:  .  aspirin 81 MG chewable tablet, Chew 81 mg by mouth daily., Disp: , Rfl:  .  Calcium Carbonate-Vitamin D3 (CALCIUM 600/VITAMIN D) 600-400 MG-UNIT TABS, Take 1 tablet by mouth 2 (two) times daily., Disp: , Rfl:  .  carvedilol (COREG) 25 MG tablet, Take 12.5 mg by mouth 2 (two) times daily with a meal., Disp: , Rfl:  .  diclofenac sodium (VOLTAREN) 1 % GEL, Apply 2 g topically 4 (four) times daily., Disp: 100 g, Rfl: 0 .  ferrous sulfate 325 (65 FE) MG tablet, Take 325 mg by mouth 3 (three) times daily. , Disp: , Rfl:  .  glipiZIDE (GLUCOTROL) 5 MG tablet, Take 2.5 mg by mouth daily before breakfast., Disp: , Rfl:  .  hydrALAZINE (APRESOLINE) 50 MG tablet, Take 50 mg by mouth 3 (three) times daily., Disp: , Rfl:  .  Investigational - Study Medication, Take 1 tablet by mouth 2 (two) times daily. Study name: Galactic Heart Failure Study Additional study details: Omecamtiv Mecarbil or Placebo, Disp: 1 each, Rfl: PRN .  isosorbide mononitrate (IMDUR) 60 MG 24 hr tablet, Take 1 tablet (60 mg total) by mouth daily., Disp: 30 tablet, Rfl: 0 .  loratadine (CLARITIN) 10 MG tablet, Take 10 mg by mouth daily., Disp: , Rfl:   .  Magnesium Oxide 420 MG TABS, Take 840 mg by mouth daily., Disp: , Rfl:  .  metolazone (ZAROXOLYN) 2.5 MG tablet, Take 1 tablet (2.5 mg total) by mouth once a week. Monday, Disp: 12 tablet, Rfl: 0 .  Multiple Vitamin (MULTIVITAMIN) tablet, Take 1 tablet by mouth daily., Disp: , Rfl:  .  omeprazole (PRILOSEC) 40 MG capsule, Take 40 mg by mouth daily., Disp: , Rfl:  .  potassium chloride (K-DUR,KLOR-CON) 10 MEQ tablet, Take 4 tablets (40 mEq total) by mouth 2 (two) times daily., Disp: 240 tablet, Rfl: 2 .  rOPINIRole (REQUIP) 0.5 MG tablet, Take 0.5 mg by mouth at bedtime. , Disp: , Rfl:  .  sertraline (ZOLOFT) 100 MG tablet, Take 150 mg by mouth daily., Disp: , Rfl:  .  simvastatin (ZOCOR) 20 MG tablet, Take 20 mg by mouth daily., Disp: , Rfl:  .  spironolactone (ALDACTONE) 25 MG tablet, Take 12.5 mg by mouth daily., Disp: , Rfl:  .  torsemide (DEMADEX) 20 MG tablet, Take 1 tablet (20 mg total) by mouth 2 (two) times daily., Disp: 120 tablet, Rfl: 3 Allergies  Allergen Reactions  . Codeine Itching  . Gabapentin Itching  . Haldol [Haloperidol Lactate] Other (See Comments)    Dizziness, hallucinations  . Levofloxacin     Other reaction(s): Other (See Comments)  Projectile vomiting   . Lisinopril Itching and Swelling  . Losartan Itching  . Morphine And Related Nausea And Vomiting  . Penicillins Nausea And Vomiting    "Vomiting, upset stomach, Headache" Has patient had a PCN reaction causing immediate rash, facial/tongue/throat swelling, SOB or lightheadedness with hypotension: No Has patient had a PCN reaction causing severe rash involving mucus membranes or skin necrosis: No Has patient had a PCN reaction that required hospitalization: No Has patient had a PCN reaction occurring within the last 10 years: No If all of the above answers are "NO", then may proceed with Cephalosporin use.       Social History   Socioeconomic History  . Marital status: Single    Spouse name: Not on  file  . Number of children: Not on file  . Years of education: Not on file  . Highest education level: Not on file  Occupational History  . Not on file  Social Needs  . Financial resource strain: Not on file  . Food insecurity:    Worry: Not on file    Inability: Not on file  . Transportation needs:    Medical: Not on file    Non-medical: Not on file  Tobacco Use  . Smoking status: Never Smoker  . Smokeless tobacco: Never Used  Substance and Sexual Activity  . Alcohol use: No  . Drug use: No  . Sexual activity: Not on file  Lifestyle  . Physical activity:    Days per week: Not on file    Minutes per session: Not on file  . Stress: Not on file  Relationships  . Social connections:    Talks on phone: Not on file    Gets together: Not on file    Attends religious service: Not on file    Active member of club or organization: Not on file    Attends meetings of clubs or organizations: Not on file    Relationship status: Not on file  . Intimate partner violence:    Fear of current or ex partner: Not on file    Emotionally abused: Not on file    Physically abused: Not on file    Forced sexual activity: Not on file  Other Topics Concern  . Not on file  Social History Narrative  . Not on file    Physical Exam      Future Appointments  Date Time Provider [place]  05/02/2018 10:00 AM [place] [facility] None    There were no vitals taken for this visit.  Weight yesterday-*** Last visit weight-***    [name], EMT 03/05/18  ACTION: {Paramed [phone]}

## 2018-03-11 ENCOUNTER — Other Ambulatory Visit (HOSPITAL_COMMUNITY): Payer: Self-pay | Admitting: Cardiology

## 2018-03-12 ENCOUNTER — Other Ambulatory Visit (HOSPITAL_COMMUNITY): Payer: Self-pay

## 2018-03-12 MED ORDER — POTASSIUM CHLORIDE CRYS ER 10 MEQ PO TBCR: 40.0000 meq | EXTENDED_RELEASE_TABLET | Freq: Two times a day (BID) | ORAL | 11 refills | Status: DC

## 2018-03-12 NOTE — Progress Notes (Signed)
Paramedicine Encounter    Patient ID: Michelle Andrade, female    DOB: Nov 07, 1960, 57 y.o.   MRN: [id number]   Patient Care Team: Henderson Baltimore, [name] as PCP - General (Nurse Practitioner)  Patient Active Problem List   Diagnosis Date Noted  . AKI (acute kidney injury) ([place])   . Type 2 diabetes mellitus with stage 3 chronic kidney disease ([place]) 02/13/2017  . Acute on chronic systolic (congestive) heart failure ([place])   . CKD (chronic kidney disease), stage III ([place])   . Constipated   . CHF (congestive heart failure) ([place]) 02/09/2017  . Pulmonary vascular congestion 02/09/2017  . Hypertension   . Sleep apnea   . PAD (peripheral artery disease) ([place]) 01/12/2014    Current Outpatient Medications:  .  amiodarone (PACERONE) 200 MG tablet, Take 200 mg by mouth daily., Disp: , Rfl:  .  aspirin 81 MG chewable tablet, Chew 81 mg by mouth daily., Disp: , Rfl:  .  Calcium Carbonate-Vitamin D3 (CALCIUM 600/VITAMIN D) 600-400 MG-UNIT TABS, Take 1 tablet by mouth 2 (two) times daily., Disp: , Rfl:  .  carvedilol (COREG) 25 MG tablet, Take 12.5 mg by mouth 2 (two) times daily with a meal., Disp: , Rfl:  .  diclofenac sodium (VOLTAREN) 1 % GEL, Apply 2 g topically 4 (four) times daily., Disp: 100 g, Rfl: 0 .  ferrous sulfate 325 (65 FE) MG tablet, Take 325 mg by mouth 3 (three) times daily. , Disp: , Rfl:  .  glipiZIDE (GLUCOTROL) 5 MG tablet, Take 2.5 mg by mouth daily before breakfast., Disp: , Rfl:  .  hydrALAZINE (APRESOLINE) 50 MG tablet, Take 50 mg by mouth 3 (three) times daily., Disp: , Rfl:  .  Investigational - Study Medication, Take 1 tablet by mouth 2 (two) times daily. Study name: Galactic Heart Failure Study Additional study details: Omecamtiv Mecarbil or Placebo, Disp: 1 each, Rfl: PRN .  isosorbide mononitrate (IMDUR) 60 MG 24 hr tablet, Take 1 tablet (60 mg total) by mouth daily., Disp: 30 tablet, Rfl: 0 .  loratadine (CLARITIN) 10 MG tablet, Take 10 mg by mouth daily., Disp: , Rfl:   .  Magnesium Oxide 420 MG TABS, Take 840 mg by mouth daily., Disp: , Rfl:  .  metolazone (ZAROXOLYN) 2.5 MG tablet, Take 1 tablet (2.5 mg total) by mouth once a week. Monday, Disp: 12 tablet, Rfl: 0 .  Multiple Vitamin (MULTIVITAMIN) tablet, Take 1 tablet by mouth daily., Disp: , Rfl:  .  omeprazole (PRILOSEC) 40 MG capsule, Take 40 mg by mouth daily., Disp: , Rfl:  .  potassium chloride (K-DUR,KLOR-CON) 10 MEQ tablet, Take 4 tablets (40 mEq total) by mouth 2 (two) times daily., Disp: 240 tablet, Rfl: 2 .  rOPINIRole (REQUIP) 0.5 MG tablet, Take 0.5 mg by mouth at bedtime. , Disp: , Rfl:  .  sertraline (ZOLOFT) 100 MG tablet, Take 150 mg by mouth daily., Disp: , Rfl:  .  simvastatin (ZOCOR) 20 MG tablet, Take 20 mg by mouth daily., Disp: , Rfl:  .  spironolactone (ALDACTONE) 25 MG tablet, Take 12.5 mg by mouth daily., Disp: , Rfl:  .  torsemide (DEMADEX) 20 MG tablet, Take 1 tablet (20 mg total) by mouth 2 (two) times daily., Disp: 120 tablet, Rfl: 3 Allergies  Allergen Reactions  . Codeine Itching  . Gabapentin Itching  . Haldol [Haloperidol Lactate] Other (See Comments)    Dizziness, hallucinations  . Levofloxacin     Other reaction(s): Other (See Comments)  Projectile vomiting   . Lisinopril Itching and Swelling  . Losartan Itching  . Morphine And Related Nausea And Vomiting  . Penicillins Nausea And Vomiting    "Vomiting, upset stomach, Headache" Has patient had a PCN reaction causing immediate rash, facial/tongue/throat swelling, SOB or lightheadedness with hypotension: No Has patient had a PCN reaction causing severe rash involving mucus membranes or skin necrosis: No Has patient had a PCN reaction that required hospitalization: No Has patient had a PCN reaction occurring within the last 10 years: No If all of the above answers are "NO", then may proceed with Cephalosporin use.       Social History   Socioeconomic History  . Marital status: Single    Spouse name: Not on  file  . Number of children: Not on file  . Years of education: Not on file  . Highest education level: Not on file  Occupational History  . Not on file  Social Needs  . Financial resource strain: Not on file  . Food insecurity:    Worry: Not on file    Inability: Not on file  . Transportation needs:    Medical: Not on file    Non-medical: Not on file  Tobacco Use  . Smoking status: Never Smoker  . Smokeless tobacco: Never Used  Substance and Sexual Activity  . Alcohol use: No  . Drug use: No  . Sexual activity: Not on file  Lifestyle  . Physical activity:    Days per week: Not on file    Minutes per session: Not on file  . Stress: Not on file  Relationships  . Social connections:    Talks on phone: Not on file    Gets together: Not on file    Attends religious service: Not on file    Active member of club or organization: Not on file    Attends meetings of clubs or organizations: Not on file    Relationship status: Not on file  . Intimate partner violence:    Fear of current or ex partner: Not on file    Emotionally abused: Not on file    Physically abused: Not on file    Forced sexual activity: Not on file  Other Topics Concern  . Not on file  Social History Narrative  . Not on file    Physical Exam  Constitutional: She is oriented to person, place, and time.  Neck: No JVD present.  Cardiovascular: Normal rate and regular rhythm.  Pulmonary/Chest: Effort normal and breath sounds normal.  Abdominal: Soft. She exhibits no distension.  Musculoskeletal: Normal range of motion. She exhibits no edema.  Neurological: She is alert and oriented to person, place, and time.  Skin: Skin is warm and dry.  Psychiatric: She has a normal mood and affect.        Future Appointments  Date Time Provider [place]  05/02/2018 10:00 AM [place] 2 [facility] None    BP 118/84 (BP Location: Left Arm, Patient Position: Sitting, Cuff Size:  Large)   Pulse 81   Resp 18   Wt 200 lb (90.7 kg)   SpO2 96%   BMI 36.58 kg/m   Weight yesterday- did not weigh Last visit weight- 200 lb  Michelle Andrade was seen at home today and reported feeling well. She denied increased SOB or dizziness and denied headaches. She reported that she has been compliant with her medications but less so with her diet. She also did not weigh  yesterday, which I have noticed is becoming a pattern. I expressed the importance of daily weights and she said she would get back on track. Her medications were verified and her pillbox was refilled but she was out of potassium and ran out of amiodarone. She ordered the amiodarone through the [place] but the potassium was needed sooner so I contacted the clinic to get a prescription sent locally. Michelle Andrade stated she would be able to go pick it up today and was left with written, detailed instructions on where the medications go after she gets them. No other issues noted.   [name], EMT 03/12/18  ACTION: Home visit completed Next visit planned for 2 week

## 2018-03-20 DIAGNOSIS — N2581 Secondary hyperparathyroidism of renal origin: Secondary | ICD-10-CM | POA: Insufficient documentation

## 2018-03-25 ENCOUNTER — Telehealth (HOSPITAL_COMMUNITY): Payer: Self-pay

## 2018-03-25 NOTE — Telephone Encounter (Signed)
I called Michelle Andrade to schedule and appointment. She stated she would be available tomorrow at 11:30. She also stated she was having foot pain that she believed was from gout. She asked if the clinic would be able to call in any medication for this issue but since it is not HF related I explained that they could not. She has an eye exam at the [facility] tomorrow afternoon so I encouraged her to see her PCP or go to the walk in clinic while she is there.

## 2018-03-26 ENCOUNTER — Other Ambulatory Visit (HOSPITAL_COMMUNITY): Payer: Self-pay

## 2018-03-26 NOTE — Progress Notes (Signed)
Paramedicine Encounter    Patient ID: Maguire Killmer, female    DOB: April 02, 1961, 57 y.o.   MRN: [id number]   Patient Care Team: Henderson Baltimore, [name] as PCP - General (Nurse Practitioner)  Patient Active Problem List   Diagnosis Date Noted  . AKI (acute kidney injury) ([place])   . Type 2 diabetes mellitus with stage 3 chronic kidney disease ([place]) 02/13/2017  . Acute on chronic systolic (congestive) heart failure ([place])   . CKD (chronic kidney disease), stage III ([place])   . Constipated   . CHF (congestive heart failure) ([place]) 02/09/2017  . Pulmonary vascular congestion 02/09/2017  . Hypertension   . Sleep apnea   . PAD (peripheral artery disease) ([place]) 01/12/2014    Current Outpatient Medications:  .  amiodarone (PACERONE) 200 MG tablet, Take 200 mg by mouth daily., Disp: , Rfl:  .  aspirin 81 MG chewable tablet, Chew 81 mg by mouth daily., Disp: , Rfl:  .  Calcium Carbonate-Vitamin D3 (CALCIUM 600/VITAMIN D) 600-400 MG-UNIT TABS, Take 1 tablet by mouth 2 (two) times daily., Disp: , Rfl:  .  carvedilol (COREG) 25 MG tablet, Take 12.5 mg by mouth 2 (two) times daily with a meal., Disp: , Rfl:  .  diclofenac sodium (VOLTAREN) 1 % GEL, Apply 2 g topically 4 (four) times daily., Disp: 100 g, Rfl: 0 .  ferrous sulfate 325 (65 FE) MG tablet, Take 325 mg by mouth 3 (three) times daily. , Disp: , Rfl:  .  glipiZIDE (GLUCOTROL) 5 MG tablet, Take 2.5 mg by mouth daily before breakfast., Disp: , Rfl:  .  hydrALAZINE (APRESOLINE) 50 MG tablet, Take 50 mg by mouth 3 (three) times daily., Disp: , Rfl:  .  Investigational - Study Medication, Take 1 tablet by mouth 2 (two) times daily. Study name: Galactic Heart Failure Study Additional study details: Omecamtiv Mecarbil or Placebo, Disp: 1 each, Rfl: PRN .  isosorbide mononitrate (IMDUR) 60 MG 24 hr tablet, Take 1 tablet (60 mg total) by mouth daily., Disp: 30 tablet, Rfl: 0 .  loratadine (CLARITIN) 10 MG tablet, Take 10 mg by mouth daily., Disp: , Rfl:   .  Magnesium Oxide 420 MG TABS, Take 840 mg by mouth daily., Disp: , Rfl:  .  metolazone (ZAROXOLYN) 2.5 MG tablet, Take 1 tablet (2.5 mg total) by mouth once a week. Monday, Disp: 12 tablet, Rfl: 0 .  Multiple Vitamin (MULTIVITAMIN) tablet, Take 1 tablet by mouth daily., Disp: , Rfl:  .  omeprazole (PRILOSEC) 40 MG capsule, Take 40 mg by mouth daily., Disp: , Rfl:  .  potassium chloride (K-DUR,KLOR-CON) 10 MEQ tablet, Take 4 tablets (40 mEq total) by mouth 2 (two) times daily., Disp: 240 tablet, Rfl: 11 .  rOPINIRole (REQUIP) 0.5 MG tablet, Take 0.5 mg by mouth at bedtime. , Disp: , Rfl:  .  sertraline (ZOLOFT) 100 MG tablet, Take 150 mg by mouth daily., Disp: , Rfl:  .  simvastatin (ZOCOR) 20 MG tablet, Take 20 mg by mouth daily., Disp: , Rfl:  .  spironolactone (ALDACTONE) 25 MG tablet, Take 12.5 mg by mouth daily., Disp: , Rfl:  .  torsemide (DEMADEX) 20 MG tablet, Take 1 tablet (20 mg total) by mouth 2 (two) times daily., Disp: 120 tablet, Rfl: 3 Allergies  Allergen Reactions  . Codeine Itching  . Gabapentin Itching  . Haldol [Haloperidol Lactate] Other (See Comments)    Dizziness, hallucinations  . Levofloxacin     Other reaction(s): Other (See Comments)  Projectile vomiting   . Lisinopril Itching and Swelling  . Losartan Itching  . Morphine And Related Nausea And Vomiting  . Penicillins Nausea And Vomiting    "Vomiting, upset stomach, Headache" Has patient had a PCN reaction causing immediate rash, facial/tongue/throat swelling, SOB or lightheadedness with hypotension: No Has patient had a PCN reaction causing severe rash involving mucus membranes or skin necrosis: No Has patient had a PCN reaction that required hospitalization: No Has patient had a PCN reaction occurring within the last 10 years: No If all of the above answers are "NO", then may proceed with Cephalosporin use.       Social History   Socioeconomic History  . Marital status: Single    Spouse name: Not on  file  . Number of children: Not on file  . Years of education: Not on file  . Highest education level: Not on file  Occupational History  . Not on file  Social Needs  . Financial resource strain: Not on file  . Food insecurity:    Worry: Not on file    Inability: Not on file  . Transportation needs:    Medical: Not on file    Non-medical: Not on file  Tobacco Use  . Smoking status: Never Smoker  . Smokeless tobacco: Never Used  Substance and Sexual Activity  . Alcohol use: No  . Drug use: No  . Sexual activity: Not on file  Lifestyle  . Physical activity:    Days per week: Not on file    Minutes per session: Not on file  . Stress: Not on file  Relationships  . Social connections:    Talks on phone: Not on file    Gets together: Not on file    Attends religious service: Not on file    Active member of club or organization: Not on file    Attends meetings of clubs or organizations: Not on file    Relationship status: Not on file  . Intimate partner violence:    Fear of current or ex partner: Not on file    Emotionally abused: Not on file    Physically abused: Not on file    Forced sexual activity: Not on file  Other Topics Concern  . Not on file  Social History Narrative  . Not on file    Physical Exam  Constitutional: She is oriented to person, place, and time.  Cardiovascular: Normal rate and regular rhythm.  Pulmonary/Chest: Effort normal and breath sounds normal.  Abdominal: Soft.  Musculoskeletal: Normal range of motion. She exhibits no edema.  Neurological: She is alert and oriented to person, place, and time.  Skin: Skin is warm and dry.  Psychiatric: She exhibits a depressed mood.        Future Appointments  Date Time Provider [place]  05/02/2018 10:00 AM [name] 2 [facility] None    BP 133/76 (BP Location: Left Arm, Patient Position: Sitting, Cuff Size: Large)   Pulse 94   Resp 16   Wt 197 lb 3.2 oz (89.4  kg)   SpO2 96%   BMI 36.07 kg/m   Weight yesterday- 198.8 lb Last visit weight- 200 lb  Ms Channing was seen at home today and exhibited a depressed mood. She stated that she did not feel like it was fair that she is alive and others that are "better" and "have more going for them" have been getting cancer and dying. This arose from her finding out that  a friend's husband has cancer and she did not think it was fair. She began saying that she did not think she deserves to live if other people are dying and she was further upset by her PCP telling her that she would not be able to donate her organs posthumously. To my knowledge that conversation with her PCP happened several months ago but it seemed she was overwhelmed with grief today. I tied to encourage her to look at how her life impacts so many people around her but this did not seem to change her outlook. She said that she did not want to kill herself but was ready to die and hoped that her nephrologist told her that dialysis was necessary so she could refuse treatment. She also asked if she would die if she stopped taking her medications. I explained that while it is likely that she would suffer further damage to her heart and other systems as a result, that death was not an absolute certainty if she stopped. She began crying with her face buried in her hands and then abruptly stopped and shook her head side to side. She raised her face out of her hands and began speaking in a clearer tone and I noticed a shift in her body language. She began speaking in the third person, referring to herself and "she," "her," and "Val." Ms Sze had previously told me that she has dissociative identity disorder which resulted in her being medically discharge from the [employer]. She had also mentioned that she has several alternate personalities. The identity speaking at this time introduced herself as "[place]" and said "Val has been having a hard time with a lot of stuff  lately and one of the other ones gets in her head and tells her things like 'You're terrible and a bad person' and stuff like that. So we try to comfort her sometimes." "Jerrell Belfast" went on to speak about other identities and the roles they play in Ms Rump's day-to-day life. I asked "Jerrell Belfast when Val's next psychiatry appointment was and she said "Oh, I don't know but I can go look for you." After talking together for about 20 minutes "Jerrell Belfast" said that she "knows Val has an appointment so I need to got get [place] in the shower," and went to the back of the house. I continued to fill her pillbox and upon finishing I called to the back of the house, "Val, I'm ready to take vital signs." Ms Tortorelli then came in and sat for her vital signs and physical assessment and seems to be in much better spirits. She continued to answer to me calling her "Val" throughout the assessment, though she remained in the same clothes and had not taken a shower. I asked if she was feeling better after taking about how she was feeling and she said yes. I implored her to continue taking her medications and to call me if she continued to feel sad or needed to talk. I also asked her to contact her psychiatrist to talk about her depression and she agreed. I will follow up with Ms Eulas Post tomorrow and also speak with Raquel Sarna, LCSW, regarding this interaction.   [name], EMT 03/26/18  ACTION: Home visit completed Next visit planned for 1 week

## 2018-03-27 ENCOUNTER — Emergency Department (HOSPITAL_BASED_OUTPATIENT_CLINIC_OR_DEPARTMENT_OTHER)
Admission: EM | Admit: 2018-03-27 | Discharge: 2018-03-27 | Disposition: A | Discharge status: Home / Self Care | Payer: Medicare Other | Attending: Emergency Medicine | Admitting: Emergency Medicine

## 2018-03-27 ENCOUNTER — Encounter (HOSPITAL_BASED_OUTPATIENT_CLINIC_OR_DEPARTMENT_OTHER): Payer: Self-pay | Admitting: Emergency Medicine

## 2018-03-27 ENCOUNTER — Other Ambulatory Visit: Payer: Self-pay

## 2018-03-27 DIAGNOSIS — R600 Localized edema: Secondary | ICD-10-CM | POA: Diagnosis not present

## 2018-03-27 DIAGNOSIS — I2510 Atherosclerotic heart disease of native coronary artery without angina pectoris: Secondary | ICD-10-CM | POA: Diagnosis not present

## 2018-03-27 DIAGNOSIS — M25562 Pain in left knee: Secondary | ICD-10-CM

## 2018-03-27 DIAGNOSIS — N183 Chronic kidney disease, stage 3 (moderate): Secondary | ICD-10-CM | POA: Diagnosis not present

## 2018-03-27 DIAGNOSIS — Z7984 Long term (current) use of oral hypoglycemic drugs: Secondary | ICD-10-CM | POA: Diagnosis not present

## 2018-03-27 DIAGNOSIS — I5020 Unspecified systolic (congestive) heart failure: Secondary | ICD-10-CM | POA: Diagnosis not present

## 2018-03-27 DIAGNOSIS — M79674 Pain in right toe(s): Secondary | ICD-10-CM | POA: Insufficient documentation

## 2018-03-27 DIAGNOSIS — E1122 Type 2 diabetes mellitus with diabetic chronic kidney disease: Secondary | ICD-10-CM | POA: Diagnosis not present

## 2018-03-27 DIAGNOSIS — Z7982 Long term (current) use of aspirin: Secondary | ICD-10-CM | POA: Diagnosis not present

## 2018-03-27 DIAGNOSIS — Z95810 Presence of automatic (implantable) cardiac defibrillator: Secondary | ICD-10-CM | POA: Insufficient documentation

## 2018-03-27 DIAGNOSIS — Z79899 Other long term (current) drug therapy: Secondary | ICD-10-CM | POA: Diagnosis not present

## 2018-03-27 DIAGNOSIS — I130 Hypertensive heart and chronic kidney disease with heart failure and stage 1 through stage 4 chronic kidney disease, or unspecified chronic kidney disease: Secondary | ICD-10-CM | POA: Diagnosis not present

## 2018-03-27 MED ORDER — PREDNISONE 10 MG PO TABS: 40.0000 mg | ORAL_TABLET | Freq: Every day | ORAL | 0 refills | Status: AC

## 2018-03-27 MED ORDER — DICLOFENAC SODIUM 1 % TD GEL: 2.0000 g | Freq: Four times a day (QID) | TRANSDERMAL | 0 refills | Status: DC

## 2018-03-27 MED FILL — DICLOFENAC SODIUM 1% GEL: 1 | 13 days supply | Qty: 100 | Fill #0

## 2018-03-27 NOTE — ED Triage Notes (Signed)
L knee pain and swelling, R great toe pain. Hx of gout.

## 2018-03-27 NOTE — ED Provider Notes (Signed)
[facility] EMERGENCY DEPARTMENT Provider Note   CSN: [id number] Arrival date & time: 03/27/18  1613     History   Chief Complaint Chief Complaint  Patient presents with  . Knee Pain  . Toe Pain    HPI Michelle Andrade is a 57 y.o. female setting for evaluation of right toe and left knee pain.  Patient states that last week, she started to develop pain of her right toe.  She felt this was consistent with previous gout episodes.  She has been treating with ice, rest, and elevation.  She states that yesterday, she started to develop pain of her left knee.  She has been walking abnormally due to her right toe pain, putting more weight on her left knee.  She has a history of arthritis in her left knee, and has frequent flareups.  She states this is consistent with an arthritis flareup.  She states her knee is swollen and feels tight.  She denies fall, trauma, or injury.  She states she is unable to take anti-inflammatories due to kidney disease and a new heart medication.  She denies numbness or tingling.  She has fevers, chills, or redness of the joint.  She denies symptoms on the right side.  Pain of the knee is worse with movement, nothing makes it better. Toe pain is improving, almost resolved.   HPI  Past Medical History:  Diagnosis Date  . Arthritis 02/08/2017  . 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 CAD (coronary artery disease) 09/01/2013  . CHF (congestive heart failure) ([place]) 06/07/2015  . CKD (chronic kidney disease), stage III ([place]) 12/09/2013  . Depression 07/27/2013  . Diabetes mellitus without complication ([place]) 67/34[birth year]1937  . GERD (gastroesophageal reflux disease) 07/27/2013  . Gout 12/29/2013  . Heart valve disorder 06/03/2013  . High cholesterol 11/10/2013  . Hypertension 11/10/2013  . IBS (irritable bowel syndrome) 09/27/2014  . Myocardial infarct ([place])   . Sleep apnea 10/13/2013  . Vascular disorder 01/12/2014     Patient Active Problem List   Diagnosis Date Noted  . AKI (acute kidney injury) ([place])   . Type 2 diabetes mellitus with stage 3 chronic kidney disease ([place]) 02/13/2017  . Acute on chronic systolic (congestive) heart failure ([place])   . CKD (chronic kidney disease), stage III ([place])   . Constipated   . CHF (congestive heart failure) ([place]) 02/09/2017  . Pulmonary vascular congestion 02/09/2017  . Hypertension   . Sleep apnea   . PAD (peripheral artery disease) ([place]) 01/12/2014    Past Surgical History:  Procedure Laterality Date  . ABDOMINAL HYSTERECTOMY    . CARDIAC DEFIBRILLATOR PLACEMENT    . CARPAL TUNNEL RELEASE    . HEEL SPUR EXCISION    . KNEE SURGERY    . PACEMAKER GENERATOR CHANGE    . RIGHT HEART CATH N/A 02/13/2017   Procedure: Right Heart Cath;  Surgeon: Jolaine Artist, MD;  Location: [facility];  Service: Cardiovascular;  Laterality: N/A;  . SHOULDER SURGERY       OB History   None      Home Medications    Prior to Admission medications   Medication Sig Start Date End Date Taking? Authorizing Provider  amiodarone (PACERONE) 200 MG tablet Take 200 mg by mouth daily.    [provider]  aspirin 81 MG chewable tablet Chew 81 mg by mouth daily.    [provider]  Calcium Carbonate-Vitamin D3 (CALCIUM 600/VITAMIN D) 600-400 MG-UNIT TABS  Take 1 tablet by mouth 2 (two) times daily.    [provider]  carvedilol (COREG) 25 MG tablet Take 12.5 mg by mouth 2 (two) times daily with a meal.    [provider]  diclofenac sodium (VOLTAREN) 1 % GEL Apply 2 g topically 4 (four) times daily. 03/27/18   Tian Davison, PA-C  ferrous sulfate 325 (65 FE) MG tablet Take 325 mg by mouth 3 (three) times daily.     [provider]  glipiZIDE (GLUCOTROL) 5 MG tablet Take 2.5 mg by mouth daily before breakfast.    [provider]  hydrALAZINE (APRESOLINE) 50 MG tablet Take 50 mg by mouth 3 (three) times daily.     [provider]  Investigational - Study Medication Take 1 tablet by mouth 2 (two) times daily. Study name: Galactic Heart Failure Study Additional study details: Omecamtiv Mecarbil or Placebo 02/15/17   Larey Dresser, MD  isosorbide mononitrate (IMDUR) 60 MG 24 hr tablet Take 1 tablet (60 mg total) by mouth daily. 02/16/17   Mariel Aloe, MD  loratadine (CLARITIN) 10 MG tablet Take 10 mg by mouth daily.    [provider]  Magnesium Oxide 420 MG TABS Take 840 mg by mouth daily.    [provider]  metolazone (ZAROXOLYN) 2.5 MG tablet Take 1 tablet (2.5 mg total) by mouth once a week. Monday 11/21/17 03/26/18  Bensimhon, Shaune Pascal, MD  Multiple Vitamin (MULTIVITAMIN) tablet Take 1 tablet by mouth daily.    [provider]  omeprazole (PRILOSEC) 40 MG capsule Take 40 mg by mouth daily.    [provider]  potassium chloride (K-DUR,KLOR-CON) 10 MEQ tablet Take 4 tablets (40 mEq total) by mouth 2 (two) times daily. 03/12/18   Clegg, Amy D, NP  predniSONE (DELTASONE) 10 MG tablet Take 4 tablets (40 mg total) by mouth daily for 5 days. 03/27/18 04/01/18  Graycie Halley, PA-C  rOPINIRole (REQUIP) 0.5 MG tablet Take 0.5 mg by mouth at bedtime.     [provider]  sertraline (ZOLOFT) 100 MG tablet Take 150 mg by mouth daily.    [provider]  simvastatin (ZOCOR) 20 MG tablet Take 20 mg by mouth daily.    [provider]  spironolactone (ALDACTONE) 25 MG tablet Take 12.5 mg by mouth daily.    [provider]  torsemide (DEMADEX) 20 MG tablet Take 1 tablet (20 mg total) by mouth 2 (two) times daily. 04/11/17   Shirley Friar, PA-C    Family History Family History  Problem Relation Age of Onset  . Hypertension Mother     Social History Social History   Tobacco Use  . Smoking status: Never Smoker  . Smokeless tobacco: Never Used  Substance Use Topics  . Alcohol use: No  . Drug use: No     Allergies     Codeine; Gabapentin; Haldol [haloperidol lactate]; Levofloxacin; Lisinopril; Losartan; Morphine and related; and Penicillins   Review of Systems Review of Systems  Musculoskeletal: Positive for arthralgias and joint swelling.  Neurological: Negative for numbness.     Physical Exam Updated Vital Signs BP (!) 134/98   Pulse 100   Temp 98.2 F (36.8 C) (Oral)   Resp 20   Ht 5\' 3"  (1.6 m)   Wt 89.8 kg (198 lb)   SpO2 100%   BMI 35.07 kg/m   Physical Exam  Constitutional: She is oriented to person, place, and time. She appears well-developed and well-nourished. No distress.  HENT:  Head: Normocephalic and atraumatic.  Eyes: EOM are normal.  Neck: Normal range of motion.  Pulmonary/Chest: Effort normal.  Abdominal: She exhibits no distension.  Musculoskeletal: Normal range of motion. She exhibits edema and tenderness. She exhibits no deformity.  Minimal swelling of the right first MTP with mild erythema.  Sensation intact.  Pedal pulses intact bilaterally.  No significant swelling of the foot. Swelling of the left knee, superiorly and bilaterally.  No erythema or warmth.  Pedal and popliteal pulses intact bilaterally.  Sensation intact bilaterally.  No laceration or obvious deformity.  Full passive range of motion with minimal pain.  No tenderness to palpation of the calf or thigh  Neurological: She is alert and oriented to person, place, and time. No sensory deficit.  Skin: Skin is warm. Capillary refill takes less than 2 seconds. No rash noted.  Psychiatric: She has a normal mood and affect.  Nursing note and vitals reviewed.    ED Treatments / Results  Labs (all labs ordered are listed, but only abnormal results are displayed) Labs Reviewed - No data to display  EKG None  Radiology No results found.  Procedures Procedures (including critical care time)  Medications Ordered in ED Medications - No data to display   Initial Impression / Assessment and Plan / ED  Course  I have reviewed the triage vital signs and the nursing notes.  Pertinent labs & imaging results that were available during my care of the patient were reviewed by me and considered in my medical decision making (see chart for details).     Patient presenting for evaluation of left knee pain/swelling and right toe pain.  Physical exam reassuring, patient is neurovascularly intact.  Right toe pain has been improving with ice, rest, and elevation.  Patient to continue treating this way.  Left knee shows mild swelling without erythema warmth, doubt septic joint.  No difficulty with passive range of motion.  No trauma or injury, I do not believe x-rays would be beneficial at this time.  Patient has known history of arthritis.  Likely arthritis flare.  Patient is unable to take NSAIDs.  She is unsure if she has taken prednisone in the past.  Discussed importance of monitoring her blood sugar while taking prednisone, and cessation if she develops hyperglycemia.  Will treat with a knee sleeve and topical Voltaren gel.  Patient to follow-up with PCP and/or orthopedic doctor for further evaluation.  At this time, patient appears safe for discharge.  Return precautions given.  Patient states she understands and agrees to plan.  Final Clinical Impressions(s) / ED Diagnoses   Final diagnoses:  Acute pain of left knee  Pain of right great toe    ED Discharge Orders        Ordered    diclofenac sodium (VOLTAREN) 1 % GEL  4 times daily     03/27/18 1654    predniSONE (DELTASONE) 10 MG tablet  Daily     03/27/18 1654       Sindy Mccune, PA-C 03/27/18 1707    Tegeler, Gwenyth Allegra, MD 03/28/18 [phone]

## 2018-03-27 NOTE — Discharge Instructions (Signed)
Take prednisone as prescribed.  Make sure you are keeping a close eye on her blood sugars, if they start to be elevated, stop taking the prednisone. Use Voltaren gel as needed for pain and swelling. Continue applying ice to the area to help with swelling. Wear the knee sleeve to help with swelling and support. Follow-up with your primary care doctor and/or the orthopedic doctor as needed for further evaluation of your symptoms. Return to the emergency room if you develop any new, worsening, or concerning symptoms.

## 2018-04-01 ENCOUNTER — Ambulatory Visit (INDEPENDENT_AMBULATORY_CARE_PROVIDER_SITE_OTHER): Payer: Medicare Other | Admitting: Family Medicine

## 2018-04-01 ENCOUNTER — Encounter: Payer: Self-pay | Admitting: Family Medicine

## 2018-04-01 VITALS — BP 127/75 | HR 66 | Ht 63.0 in | Wt 200.0 lb

## 2018-04-01 DIAGNOSIS — M25562 Pain in left knee: Secondary | ICD-10-CM

## 2018-04-01 DIAGNOSIS — M25561 Pain in right knee: Secondary | ICD-10-CM

## 2018-04-01 NOTE — Patient Instructions (Signed)
Your pain is due to arthritis. These are the different medications you can take for this: Tylenol 500mg  1-2 tabs three times a day for pain. Capsaicin, aspercreme, or biofreeze topically up to four times a day may also help with pain. Some supplements that may help for arthritis: Boswellia extract, curcumin, pycnogenol Voltaren gel up to 4 times a day as needed for pain and inflammation Cortisone injections are an option - let me know if the knees bother you enough that you want to do this - as you know it would increase your blood sugars for a little over a week. If cortisone injections do not help, there are different types of shots that may help but they take longer to take effect. It's important that you continue to stay active. Straight leg raises, knee extensions 3 sets of 10 once a day (add ankle weight if these become too easy). Consider physical therapy to strengthen muscles around the joint that hurts to take pressure off of the joint itself. Shoe inserts with good arch support may be helpful. Ice 15 minutes at a time 3-4 times a day as needed to help with pain. Water aerobics and cycling with low resistance are the best two types of exercise for arthritis though any exercise is ok as long as it doesn't worsen the pain. Follow up with me in 6 weeks or as needed if you're doing well.

## 2018-04-03 ENCOUNTER — Encounter: Payer: Self-pay | Admitting: Family Medicine

## 2018-04-03 NOTE — Progress Notes (Signed)
PCP: Henderson Baltimore, [name]  Subjective:   HPI: Patient is a 57 y.o. female here for bilateral knee pain.  Patient reports she's had about 3 weeks of pain in both knees. Pain level is 3/10 in both knees, sharp, worse with walking though she states the left knee is slightly worse than the right. Started with pain in her right great toe - rested and iced and this has improved but not completely. She does have history of gout. She's tried aspirin 81mg  which she takes daily and diclofenac gel. Associated swelling. No skin changes, numbness.  Past Medical History:  Diagnosis Date  . Arthritis 02/08/2017  . 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 CAD (coronary artery disease) 09/01/2013  . CHF (congestive heart failure) ([place]) 06/07/2015  . CKD (chronic kidney disease), stage III ([place]) 12/09/2013  . Depression 07/27/2013  . Diabetes mellitus without complication ([place]) 40/98/1191  . GERD (gastroesophageal reflux disease) 07/27/2013  . Gout 12/29/2013  . Heart valve disorder 06/03/2013  . High cholesterol 11/10/2013  . Hypertension 11/10/2013  . IBS (irritable bowel syndrome) 09/27/2014  . Myocardial infarct ([place])   . Sleep apnea 10/13/2013  . Vascular disorder 01/12/2014    Current Outpatient Medications on File Prior to Visit  Medication Sig Dispense Refill  . Calcium Carbonate-Vitamin D 600-400 MG-UNIT tablet Take by mouth.    [name] amiodarone (PACERONE) 200 MG tablet Take 200 mg by mouth daily.    [name] aspirin 81 MG chewable tablet Chew 81 mg by mouth daily.    . Calcium Carbonate-Vitamin D3 (CALCIUM 600/VITAMIN D) 600-400 MG-UNIT TABS Take 1 tablet by mouth 2 (two) times daily.    . carvedilol (COREG) 25 MG tablet Take 12.5 mg by mouth 2 (two) times daily with a meal.    . diclofenac sodium (VOLTAREN) 1 % GEL Apply 2 g topically 4 (four) times daily. 100 g 0  . ferrous sulfate 325 (65 FE) MG tablet Take 325 mg by mouth 3 (three) times daily.      [name] glipiZIDE (GLUCOTROL) 5 MG tablet Take 2.5 mg by mouth daily before breakfast.    . hydrALAZINE (APRESOLINE) 50 MG tablet Take 50 mg by mouth 3 (three) times daily.    . Investigational - Study Medication Take 1 tablet by mouth 2 (two) times daily. Study name: Galactic Heart Failure Study Additional study details: Omecamtiv Mecarbil or Placebo 1 each PRN  . isosorbide mononitrate (IMDUR) 60 MG 24 hr tablet Take 1 tablet (60 mg total) by mouth daily. 30 tablet 0  . loratadine (CLARITIN) 10 MG tablet Take 10 mg by mouth daily.    . Magnesium Oxide 420 MG TABS Take 840 mg by mouth daily.    . metolazone (ZAROXOLYN) 2.5 MG tablet Take 1 tablet (2.5 mg total) by mouth once a week. Monday 12 tablet 0  . Multiple Vitamin (MULTIVITAMIN) tablet Take 1 tablet by mouth daily.    [name] omeprazole (PRILOSEC) 40 MG capsule Take 40 mg by mouth daily.    . potassium chloride (K-DUR,KLOR-CON) 10 MEQ tablet Take 4 tablets (40 mEq total) by mouth 2 (two) times daily. 240 tablet 11  . rOPINIRole (REQUIP) 0.5 MG tablet Take 0.5 mg by mouth at bedtime.     . sertraline (ZOLOFT) 100 MG tablet Take 150 mg by mouth daily.    . simvastatin (ZOCOR) 20 MG tablet Take 20 mg by mouth daily.    [name] spironolactone (ALDACTONE) 25 MG tablet Take 12.5 mg by mouth daily.    [name]  torsemide (DEMADEX) 20 MG tablet Take 1 tablet (20 mg total) by mouth 2 (two) times daily. 120 tablet 3   No current facility-administered medications on file prior to visit.     Past Surgical History:  Procedure Laterality Date  . ABDOMINAL HYSTERECTOMY    . CARDIAC DEFIBRILLATOR PLACEMENT    . CARPAL TUNNEL RELEASE    . HEEL SPUR EXCISION    . KNEE SURGERY    . PACEMAKER GENERATOR CHANGE    . RIGHT HEART CATH N/A 02/13/2017   Procedure: Right Heart Cath;  Surgeon: Jolaine Artist, MD;  Location: [facility];  Service: Cardiovascular;  Laterality: N/A;  . SHOULDER SURGERY      Allergies  Allergen Reactions  . Codeine Itching  .  Gabapentin Itching  . Haldol [Haloperidol Lactate] Other (See Comments)    Dizziness, hallucinations  . Levofloxacin     Other reaction(s): Other (See Comments) Projectile vomiting   . Lisinopril Itching and Swelling  . Losartan Itching  . Morphine And Related Nausea And Vomiting  . Penicillins Nausea And Vomiting    "Vomiting, upset stomach, Headache" Has patient had a PCN reaction causing immediate rash, facial/tongue/throat swelling, SOB or lightheadedness with hypotension: No Has patient had a PCN reaction causing severe rash involving mucus membranes or skin necrosis: No Has patient had a PCN reaction that required hospitalization: No Has patient had a PCN reaction occurring within the last 10 years: No If all of the above answers are "NO", then may proceed with Cephalosporin use.     Social History   Socioeconomic History  . Marital status: Single    Spouse name: Not on file  . Number of children: Not on file  . Years of education: Not on file  . Highest education level: Not on file  Occupational History  . Not on file  Social Needs  . Financial resource strain: Not on file  . Food insecurity:    Worry: Not on file    Inability: Not on file  . Transportation needs:    Medical: Not on file    Non-medical: Not on file  Tobacco Use  . Smoking status: Never Smoker  . Smokeless tobacco: Never Used  Substance and Sexual Activity  . Alcohol use: No  . Drug use: No  . Sexual activity: Not on file  Lifestyle  . Physical activity:    Days per week: Not on file    Minutes per session: Not on file  . Stress: Not on file  Relationships  . Social connections:    Talks on phone: Not on file    Gets together: Not on file    Attends religious service: Not on file    Active member of club or organization: Not on file    Attends meetings of clubs or organizations: Not on file    Relationship status: Not on file  . Intimate partner violence:    Fear of current or ex  partner: Not on file    Emotionally abused: Not on file    Physically abused: Not on file    Forced sexual activity: Not on file  Other Topics Concern  . Not on file  Social History Narrative  . Not on file    Family History  Problem Relation Age of Onset  . Hypertension Mother     BP 127/75   Pulse 66   Ht 5\' 3"  (1.6 m)   Wt 200 lb (90.7 kg)   BMI  35.43 kg/m   Review of Systems: See HPI above.     Objective:  Physical Exam:  Gen: NAD, comfortable in exam room  Right knee: No gross deformity, ecchymoses.  Mild effusion. Mild TTP medial and lateral joint lines. FROM with 5/5 strength flexion and extension. Negative ant/post drawers. Negative valgus/varus testing. Negative lachmanns. Negative mcmurrays, apleys, patellar apprehension. NV intact distally.  Left knee: No gross deformity, ecchymoses.  Mild effusion. Mild TTP medial and lateral joint lines. FROM with 5/5 strength flexion and extension. Negative ant/post drawers. Negative valgus/varus testing. Negative lachmanns. Negative mcmurrays, apleys, patellar apprehension. NV intact distally.   Assessment & Plan:  1. Bilateral knee pain - consistent with flare of arthritis.  No evidence of gout though she likely had flare of this in her great toe recently.  Discussed tylenol, topical medications, supplements that may help.  Voltaren gel.  Consider cortisone injections - she would like to wait on this with her history of diabetes.  Icing, elevation.  F/u in 6 weeks or as needed if doing well.

## 2018-04-09 ENCOUNTER — Other Ambulatory Visit (HOSPITAL_COMMUNITY): Payer: Self-pay

## 2018-04-09 ENCOUNTER — Telehealth (HOSPITAL_COMMUNITY): Payer: Self-pay

## 2018-04-09 NOTE — Progress Notes (Signed)
Paramedicine Encounter    Patient ID: Michelle Andrade, female    DOB: April 23, 1961, 57 y.o.   MRN: [id number]   Patient Care Team: Henderson Baltimore, [name] as PCP - General (Nurse Practitioner)  Patient Active Problem List   Diagnosis Date Noted  . Secondary hyperparathyroidism of renal origin ([place]) 03/20/2018  . Severe obesity (BMI 35.0-39.9) with comorbidity ([place]) 12/19/2017  . CKD (chronic kidney disease) stage 4, GFR 15-29 ml/min (HCC) 06/07/2017  . Acute on chronic systolic (congestive) heart failure ([place])   . CHF (congestive heart failure) ([place]) 02/09/2017  . Pulmonary vascular congestion 02/09/2017  . Hypertension   . Sleep apnea   . Chondrocalcinosis of both knees 02/08/2017  . Primary osteoarthritis of both knees 02/08/2017  . Type 2 diabetes mellitus ([place]) 11/16/2016  . Borderline personality disorder ([place]) 10/17/2016  . Carpal tunnel syndrome 10/17/2016  . 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 Eczema 07/03/2016  . Atrial tachycardia ([place]) 05/31/2016  . 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 Onychomycosis of toenail 06/08/2015  . Cardiomyopathy ([place]) 06/07/2015  . Irritable bowel syndrome 09/27/2014  . PAD (peripheral artery disease) ([employer]) 01/12/2014  . History of DVT (deep vein thrombosis) 01/12/2014  . Gout 12/29/2013  . Iron deficiency anemia 12/29/2013  . Lichenification and lichen simplex chronicus 12/26/2013  . Hyperlipidemia associated with type 2 diabetes mellitus ([place]) 11/10/2013  . Benign hypertension with chronic kidney disease, stage IV ([place]) 11/10/2013  . Disorder of magnesium metabolism 10/13/2013  . CAD in native artery 09/01/2013  . Anxiety state 07/27/2013  . Depressive disorder 07/27/2013  . Gastro-esophageal reflux disease without esophagitis 07/27/2013  . Low back pain 06/03/2013  . Mitral valve disease 06/03/2013  . Pulmonary valve disorder 06/03/2013   . Tricuspid valve disorder 06/03/2013  . 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 Allergic rhinitis 02/07/2013  . 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 Pain in joint 02/07/2013    Current Outpatient Medications:  .  amiodarone (PACERONE) 200 MG tablet, Take 200 mg by mouth daily., Disp: , Rfl:  .  aspirin 81 MG chewable tablet, Chew 81 mg by mouth daily., Disp: , Rfl:  .  Calcium Carbonate-Vitamin D3 (CALCIUM 600/VITAMIN D) 600-400 MG-UNIT TABS, Take 1 tablet by mouth 2 (two) times daily., Disp: , Rfl:  .  carvedilol (COREG) 25 MG tablet, Take 12.5 mg by mouth 2 (two) times daily with a meal., Disp: , Rfl:  .  diclofenac sodium (VOLTAREN) 1 % GEL, Apply 2 g topically 4 (four) times daily., Disp: 100 g, Rfl: 0 .  ferrous sulfate 325 (65 FE) MG tablet, Take 325 mg by mouth 3 (three) times daily. , Disp: , Rfl:  .  glipiZIDE (GLUCOTROL) 5 MG tablet, Take 2.5 mg by mouth daily before breakfast., Disp: , Rfl:  .  hydrALAZINE (APRESOLINE) 50 MG tablet, Take 50 mg by mouth 3 (three) times daily., Disp: , Rfl:  .  Investigational - Study Medication, Take 1 tablet by mouth 2 (two) times daily. Study name: Galactic Heart Failure Study Additional study details: Omecamtiv Mecarbil or Placebo, Disp: 1 each, Rfl: PRN .  isosorbide mononitrate (IMDUR) 60 MG 24 hr tablet, Take 1 tablet (60 mg total) by mouth daily., Disp: 30 tablet, Rfl: 0 .  loratadine (CLARITIN) 10 MG tablet, Take 10 mg by mouth daily., Disp: , Rfl:  .  Magnesium Oxide 420 MG TABS, Take 840 mg by mouth daily., Disp: , Rfl:  .  metolazone (ZAROXOLYN)  2.5 MG tablet, Take 1 tablet (2.5 mg total) by mouth once a week. Monday, Disp: 12 tablet, Rfl: 0 .  Multiple Vitamin (MULTIVITAMIN) tablet, Take 1 tablet by mouth daily., Disp: , Rfl:  .  omeprazole (PRILOSEC) 40 MG capsule, Take 40 mg by mouth daily., Disp: , Rfl:  .  potassium chloride (K-DUR,KLOR-CON) 10 MEQ tablet, Take 4 tablets (40 mEq total) by mouth 2 (two) times daily., Disp: 240  tablet, Rfl: 11 .  rOPINIRole (REQUIP) 0.5 MG tablet, Take 0.5 mg by mouth at bedtime. , Disp: , Rfl:  .  sertraline (ZOLOFT) 100 MG tablet, Take 150 mg by mouth daily., Disp: , Rfl:  .  simvastatin (ZOCOR) 20 MG tablet, Take 20 mg by mouth daily., Disp: , Rfl:  .  spironolactone (ALDACTONE) 25 MG tablet, Take 12.5 mg by mouth daily., Disp: , Rfl:  .  torsemide (DEMADEX) 20 MG tablet, Take 1 tablet (20 mg total) by mouth 2 (two) times daily., Disp: 120 tablet, Rfl: 3 .  Calcium Carbonate-Vitamin D 600-400 MG-UNIT tablet, Take by mouth., Disp: , Rfl:  Allergies  Allergen Reactions  . Codeine Itching  . Gabapentin Itching  . Haldol [Haloperidol Lactate] Other (See Comments)    Dizziness, hallucinations  . Levofloxacin     Other reaction(s): Other (See Comments) Projectile vomiting   . Lisinopril Itching and Swelling  . Losartan Itching  . Morphine And Related Nausea And Vomiting  . Penicillins Nausea And Vomiting    "Vomiting, upset stomach, Headache" Has patient had a PCN reaction causing immediate rash, facial/tongue/throat swelling, SOB or lightheadedness with hypotension: No Has patient had a PCN reaction causing severe rash involving mucus membranes or skin necrosis: No Has patient had a PCN reaction that required hospitalization: No Has patient had a PCN reaction occurring within the last 10 years: No If all of the above answers are "NO", then may proceed with Cephalosporin use.       Social History   Socioeconomic History  . Marital status: Single    Spouse name: Not on file  . Number of children: Not on file  . Years of education: Not on file  . Highest education level: Not on file  Occupational History  . Not on file  Social Needs  . Financial resource strain: Not on file  . Food insecurity:    Worry: Not on file    Inability: Not on file  . Transportation needs:    Medical: Not on file    Non-medical: Not on file  Tobacco Use  . Smoking status: Never Smoker   . Smokeless tobacco: Never Used  Substance and Sexual Activity  . Alcohol use: No  . Drug use: No  . Sexual activity: Not on file  Lifestyle  . Physical activity:    Days per week: Not on file    Minutes per session: Not on file  . Stress: Not on file  Relationships  . Social connections:    Talks on phone: Not on file    Gets together: Not on file    Attends religious service: Not on file    Active member of club or organization: Not on file    Attends meetings of clubs or organizations: Not on file    Relationship status: Not on file  . Intimate partner violence:    Fear of current or ex partner: Not on file    Emotionally abused: Not on file    Physically abused: Not on file  Forced sexual activity: Not on file  Other Topics Concern  . Not on file  Social History Narrative  . Not on file    Physical Exam  Constitutional: She is oriented to person, place, and time.  Cardiovascular: Normal rate and regular rhythm.  Pulmonary/Chest: Effort normal and breath sounds normal.  Abdominal: Soft.  Musculoskeletal: Normal range of motion. She exhibits no edema.  Neurological: She is alert and oriented to person, place, and time.  Skin: Skin is warm and dry.  Psychiatric: She has a normal mood and affect.        Future Appointments  Date Time Provider [place]  05/02/2018 10:00 AM [place] [facility] None  05/12/2018  2:00 PM Hudnall, Sharyn Lull, MD [facility]    BP 100/60 (BP Location: Left Arm, Patient Position: Sitting, Cuff Size: Large)   Pulse 94   Resp 18   Wt 200 lb (90.7 kg)   SpO2 95%   BMI 35.43 kg/m   Weight yesterday- 199.8 lb Last visit weight- 197.2 lb  Michelle Andrade was seen at home today and reported feeling well. She denied SOB, headache, dizziness or orthopnea. She has been compliant with her medications and appeared to be in a much better mood than the last time I visited her. Her medications were verified and her  pillbox was refilled.   [name], EMT 04/09/18  ACTION: Home visit completed Next visit planned for 2 weeks

## 2018-04-09 NOTE — Telephone Encounter (Signed)
I called Michelle Andrade to schedule an appointment. She stated she was not home at present but would be able to meet me later in the afternoon. We agreed to meet between 15:00 and 15:30

## 2018-04-23 ENCOUNTER — Other Ambulatory Visit (HOSPITAL_COMMUNITY): Payer: Self-pay

## 2018-04-23 NOTE — Progress Notes (Signed)
Paramedicine Encounter    Patient ID: Michelle Andrade, female    DOB: 10-Mar-1961, 57 y.o.   MRN: [id number]   Patient Care Team: Henderson Baltimore, [name] as PCP - General (Nurse Practitioner)  Patient Active Problem List   Diagnosis Date Noted  . Secondary hyperparathyroidism of renal origin ([place]) 03/20/2018  . Severe obesity (BMI 35.0-39.9) with comorbidity ([place]) 12/19/2017  . CKD (chronic kidney disease) stage 4, GFR 15-29 ml/min (HCC) 06/07/2017  . Acute on chronic systolic (congestive) heart failure ([place])   . CHF (congestive heart failure) ([place]) 02/09/2017  . Pulmonary vascular congestion 02/09/2017  . Hypertension   . Sleep apnea   . Chondrocalcinosis of both knees 02/08/2017  . Primary osteoarthritis of both knees 02/08/2017  . Type 2 diabetes mellitus ([place]) 11/16/2016  . Borderline personality disorder ([place]) 10/17/2016  . Carpal tunnel syndrome 10/17/2016  . 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 Eczema 07/03/2016  . Atrial tachycardia ([place]) 05/31/2016  . 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 Onychomycosis of toenail 06/08/2015  . Cardiomyopathy ([place]) 06/07/2015  . Irritable bowel syndrome 09/27/2014  . PAD (peripheral artery disease) ([place]) 01/12/2014  . History of DVT (deep vein thrombosis) 01/12/2014  . Gout 12/29/2013  . Iron deficiency anemia 12/29/2013  . Lichenification and lichen simplex chronicus 12/26/2013  . Hyperlipidemia associated with type 2 diabetes mellitus ([place]) 11/10/2013  . Benign hypertension with chronic kidney disease, stage IV ([place]) 11/10/2013  . Disorder of magnesium metabolism 10/13/2013  . CAD in native artery 09/01/2013  . Anxiety state 07/27/2013  . Depressive disorder 07/27/2013  . Gastro-esophageal reflux disease without esophagitis 07/27/2013  . Low back pain 06/03/2013  . Mitral valve disease 06/03/2013  . Pulmonary valve disorder 06/03/2013   . Tricuspid valve disorder 06/03/2013  . 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 Allergic rhinitis 02/07/2013  . 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 Pain in joint 02/07/2013    Current Outpatient Medications:  .  amiodarone (PACERONE) 200 MG tablet, Take 200 mg by mouth daily., Disp: , Rfl:  .  aspirin 81 MG chewable tablet, Chew 81 mg by mouth daily., Disp: , Rfl:  .  Calcium Carbonate-Vitamin D3 (CALCIUM 600/VITAMIN D) 600-400 MG-UNIT TABS, Take 1 tablet by mouth 2 (two) times daily., Disp: , Rfl:  .  carvedilol (COREG) 25 MG tablet, Take 12.5 mg by mouth 2 (two) times daily with a meal., Disp: , Rfl:  .  diclofenac sodium (VOLTAREN) 1 % GEL, Apply 2 g topically 4 (four) times daily., Disp: 100 g, Rfl: 0 .  ferrous sulfate 325 (65 FE) MG tablet, Take 325 mg by mouth 3 (three) times daily. , Disp: , Rfl:  .  glipiZIDE (GLUCOTROL) 5 MG tablet, Take 2.5 mg by mouth daily before breakfast., Disp: , Rfl:  .  hydrALAZINE (APRESOLINE) 50 MG tablet, Take 50 mg by mouth 3 (three) times daily., Disp: , Rfl:  .  Investigational - Study Medication, Take 1 tablet by mouth 2 (two) times daily. Study name: Galactic Heart Failure Study Additional study details: Omecamtiv Mecarbil or Placebo, Disp: 1 each, Rfl: PRN .  isosorbide mononitrate (IMDUR) 60 MG 24 hr tablet, Take 1 tablet (60 mg total) by mouth daily., Disp: 30 tablet, Rfl: 0 .  loratadine (CLARITIN) 10 MG tablet, Take 10 mg by mouth daily., Disp: , Rfl:  .  Magnesium Oxide 420 MG TABS, Take 840 mg by mouth daily., Disp: , Rfl:  .  metolazone (ZAROXOLYN)  2.5 MG tablet, Take 1 tablet (2.5 mg total) by mouth once a week. Monday, Disp: 12 tablet, Rfl: 0 .  Multiple Vitamin (MULTIVITAMIN) tablet, Take 1 tablet by mouth daily., Disp: , Rfl:  .  omeprazole (PRILOSEC) 40 MG capsule, Take 40 mg by mouth daily., Disp: , Rfl:  .  potassium chloride (K-DUR,KLOR-CON) 10 MEQ tablet, Take 4 tablets (40 mEq total) by mouth 2 (two) times daily., Disp: 240  tablet, Rfl: 11 .  rOPINIRole (REQUIP) 0.5 MG tablet, Take 0.5 mg by mouth at bedtime. , Disp: , Rfl:  .  sertraline (ZOLOFT) 100 MG tablet, Take 150 mg by mouth daily., Disp: , Rfl:  .  simvastatin (ZOCOR) 20 MG tablet, Take 20 mg by mouth daily., Disp: , Rfl:  .  spironolactone (ALDACTONE) 25 MG tablet, Take 12.5 mg by mouth daily., Disp: , Rfl:  .  torsemide (DEMADEX) 20 MG tablet, Take 1 tablet (20 mg total) by mouth 2 (two) times daily., Disp: 120 tablet, Rfl: 3 .  Calcium Carbonate-Vitamin D 600-400 MG-UNIT tablet, Take by mouth., Disp: , Rfl:  Allergies  Allergen Reactions  . Codeine Itching  . Gabapentin Itching  . Haldol [Haloperidol Lactate] Other (See Comments)    Dizziness, hallucinations  . Levofloxacin     Other reaction(s): Other (See Comments) Projectile vomiting   . Lisinopril Itching and Swelling  . Losartan Itching  . Morphine And Related Nausea And Vomiting  . Penicillins Nausea And Vomiting    "Vomiting, upset stomach, Headache" Has patient had a PCN reaction causing immediate rash, facial/tongue/throat swelling, SOB or lightheadedness with hypotension: No Has patient had a PCN reaction causing severe rash involving mucus membranes or skin necrosis: No Has patient had a PCN reaction that required hospitalization: No Has patient had a PCN reaction occurring within the last 10 years: No If all of the above answers are "NO", then may proceed with Cephalosporin use.       Social History   Socioeconomic History  . Marital status: Single    Spouse name: Not on file  . Number of children: Not on file  . Years of education: Not on file  . Highest education level: Not on file  Occupational History  . Not on file  Social Needs  . Financial resource strain: Not on file  . Food insecurity:    Worry: Not on file    Inability: Not on file  . Transportation needs:    Medical: Not on file    Non-medical: Not on file  Tobacco Use  . Smoking status: Never Smoker   . Smokeless tobacco: Never Used  Substance and Sexual Activity  . Alcohol use: No  . Drug use: No  . Sexual activity: Not on file  Lifestyle  . Physical activity:    Days per week: Not on file    Minutes per session: Not on file  . Stress: Not on file  Relationships  . Social connections:    Talks on phone: Not on file    Gets together: Not on file    Attends religious service: Not on file    Active member of club or organization: Not on file    Attends meetings of clubs or organizations: Not on file    Relationship status: Not on file  . Intimate partner violence:    Fear of current or ex partner: Not on file    Emotionally abused: Not on file    Physically abused: Not on file  Forced sexual activity: Not on file  Other Topics Concern  . Not on file  Social History Narrative  . Not on file    Physical Exam  Constitutional: She is oriented to person, place, and time.  Cardiovascular: Normal rate and regular rhythm.  Pulmonary/Chest: Effort normal and breath sounds normal.  Abdominal: Soft. Bowel sounds are normal.  Musculoskeletal: Normal range of motion. She exhibits no edema.  Neurological: She is alert and oriented to person, place, and time.  Skin: Skin is warm and dry.  Psychiatric: She has a normal mood and affect.        Future Appointments  Date Time Provider [place]  05/02/2018 10:00 AM [place] [facility] None  05/12/2018  2:00 PM Hudnall, Sharyn Lull, MD [facility]    BP 108/70 (BP Location: Left Arm, Patient Position: Sitting, Cuff Size: Large)   Pulse 88   Resp 18   Wt 199 lb (90.3 kg)   SpO2 96%   BMI 35.25 kg/m   Weight yesterday- Did not weigh Last visit weight- 200 lb  Ms Rosselli was seen at home today and reported feeling well with the exception of a gout flare in her great toe on the right foot. She stated she has been compliant with her medications but continues to eat poorly. She called her internal  medicine proider while I was present to ask it hey could send her an Rx for medication to combat her gout as she is not currently on anything. Her medications were verified and her pillbox was refilled.   [name], EMT 04/23/18  ACTION: Home visit completed Next visit planned for 1 week

## 2018-05-02 ENCOUNTER — Telehealth (HOSPITAL_COMMUNITY): Payer: Self-pay

## 2018-05-02 NOTE — Telephone Encounter (Signed)
I called Ms Brodowski to check in with her after there was a police stand off which kept her out of her house today. She stated she was back home and doing well. While I had her on the phone I schedule a visit for next Wednesday at 09:00.

## 2018-05-06 ENCOUNTER — Telehealth (HOSPITAL_COMMUNITY): Payer: Self-pay

## 2018-05-06 NOTE — Telephone Encounter (Signed)
Michelle Andrade contacted me via text to let me know she was going to be in [place] on Thursday and asked if I would be able to see her at the support group meeting. I advised that the support group was not going to be happening this month but I could see her tomorrow if that still worked out. She advised that any time after 09:00 tomorrow would work.

## 2018-05-07 ENCOUNTER — Other Ambulatory Visit (HOSPITAL_COMMUNITY): Payer: Self-pay

## 2018-05-07 ENCOUNTER — Telehealth (HOSPITAL_COMMUNITY): Payer: Self-pay

## 2018-05-07 NOTE — Telephone Encounter (Signed)
I called Michelle Andrade to schedule an appointment time for today. She stated she was not planning on going anywhere today so we agreed on 15:00 this afternoon.

## 2018-05-07 NOTE — Progress Notes (Signed)
Paramedicine Encounter    Patient ID: Michelle Andrade, female    DOB: 1961-04-30, 57 y.o.   MRN: [id number]   Patient Care Team: Henderson Baltimore, [name] as PCP - General (Nurse Practitioner)  Patient Active Problem List   Diagnosis Date Noted  . Secondary hyperparathyroidism of renal origin ([place]) 03/20/2018  . Severe obesity (BMI 35.0-39.9) with comorbidity ([place]) 12/19/2017  . CKD (chronic kidney disease) stage 4, GFR 15-29 ml/min (HCC) 06/07/2017  . Acute on chronic systolic (congestive) heart failure ([place])   . CHF (congestive heart failure) ([place]) 02/09/2017  . Pulmonary vascular congestion 02/09/2017  . Hypertension   . Sleep apnea   . Chondrocalcinosis of both knees 02/08/2017  . Primary osteoarthritis of both knees 02/08/2017  . Type 2 diabetes mellitus ([place]) 11/16/2016  . Borderline personality disorder ([place]) 10/17/2016  . Carpal tunnel syndrome 10/17/2016  . 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 Eczema 07/03/2016  . Atrial tachycardia ([place]) 05/31/2016  . 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 Onychomycosis of toenail 06/08/2015  . Cardiomyopathy ([place]) 06/07/2015  . Irritable bowel syndrome 09/27/2014  . PAD (peripheral artery disease) ([place]) 01/12/2014  . History of DVT (deep vein thrombosis) 01/12/2014  . Gout 12/29/2013  . Iron deficiency anemia 12/29/2013  . Lichenification and lichen simplex chronicus 12/26/2013  . Hyperlipidemia associated with type 2 diabetes mellitus ([place]) 11/10/2013  . Benign hypertension with chronic kidney disease, stage IV ([place]) 11/10/2013  . Disorder of magnesium metabolism 10/13/2013  . CAD in native artery 09/01/2013  . Anxiety state 07/27/2013  . Depressive disorder 07/27/2013  . Gastro-esophageal reflux disease without esophagitis 07/27/2013  . Low back pain 06/03/2013  . Mitral valve disease 06/03/2013  . Pulmonary valve disorder 06/03/2013   . Tricuspid valve disorder 06/03/2013  . 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 Allergic rhinitis 02/07/2013  . 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 Pain in joint 02/07/2013    Current Outpatient Medications:  .  amiodarone (PACERONE) 200 MG tablet, Take 200 mg by mouth daily., Disp: , Rfl:  .  aspirin 81 MG chewable tablet, Chew 81 mg by mouth daily., Disp: , Rfl:  .  Calcium Carbonate-Vitamin D3 (CALCIUM 600/VITAMIN D) 600-400 MG-UNIT TABS, Take 1 tablet by mouth 2 (two) times daily., Disp: , Rfl:  .  carvedilol (COREG) 25 MG tablet, Take 12.5 mg by mouth 2 (two) times daily with a meal., Disp: , Rfl:  .  diclofenac sodium (VOLTAREN) 1 % GEL, Apply 2 g topically 4 (four) times daily., Disp: 100 g, Rfl: 0 .  ferrous sulfate 325 (65 FE) MG tablet, Take 325 mg by mouth 3 (three) times daily. , Disp: , Rfl:  .  glipiZIDE (GLUCOTROL) 5 MG tablet, Take 2.5 mg by mouth daily before breakfast., Disp: , Rfl:  .  hydrALAZINE (APRESOLINE) 50 MG tablet, Take 50 mg by mouth 3 (three) times daily., Disp: , Rfl:  .  Investigational - Study Medication, Take 1 tablet by mouth 2 (two) times daily. Study name: Galactic Heart Failure Study Additional study details: Omecamtiv Mecarbil or Placebo, Disp: 1 each, Rfl: PRN .  isosorbide mononitrate (IMDUR) 60 MG 24 hr tablet, Take 1 tablet (60 mg total) by mouth daily., Disp: 30 tablet, Rfl: 0 .  loratadine (CLARITIN) 10 MG tablet, Take 10 mg by mouth daily., Disp: , Rfl:  .  Magnesium Oxide 420 MG TABS, Take 840 mg by mouth daily., Disp: , Rfl:  .  metolazone (ZAROXOLYN)  2.5 MG tablet, Take 1 tablet (2.5 mg total) by mouth once a week. Monday, Disp: 12 tablet, Rfl: 0 .  Multiple Vitamin (MULTIVITAMIN) tablet, Take 1 tablet by mouth daily., Disp: , Rfl:  .  omeprazole (PRILOSEC) 40 MG capsule, Take 40 mg by mouth daily., Disp: , Rfl:  .  potassium chloride (K-DUR,KLOR-CON) 10 MEQ tablet, Take 4 tablets (40 mEq total) by mouth 2 (two) times daily., Disp: 240  tablet, Rfl: 11 .  rOPINIRole (REQUIP) 0.5 MG tablet, Take 0.5 mg by mouth at bedtime. , Disp: , Rfl:  .  sertraline (ZOLOFT) 100 MG tablet, Take 150 mg by mouth daily., Disp: , Rfl:  .  simvastatin (ZOCOR) 20 MG tablet, Take 20 mg by mouth daily., Disp: , Rfl:  .  spironolactone (ALDACTONE) 25 MG tablet, Take 12.5 mg by mouth daily., Disp: , Rfl:  .  torsemide (DEMADEX) 20 MG tablet, Take 1 tablet (20 mg total) by mouth 2 (two) times daily., Disp: 120 tablet, Rfl: 3 .  Calcium Carbonate-Vitamin D 600-400 MG-UNIT tablet, Take by mouth., Disp: , Rfl:  Allergies  Allergen Reactions  . Codeine Itching  . Gabapentin Itching  . Haldol [Haloperidol Lactate] Other (See Comments)    Dizziness, hallucinations  . Levofloxacin     Other reaction(s): Other (See Comments) Projectile vomiting   . Lisinopril Itching and Swelling  . Losartan Itching  . Morphine And Related Nausea And Vomiting  . Penicillins Nausea And Vomiting    "Vomiting, upset stomach, Headache" Has patient had a PCN reaction causing immediate rash, facial/tongue/throat swelling, SOB or lightheadedness with hypotension: No Has patient had a PCN reaction causing severe rash involving mucus membranes or skin necrosis: No Has patient had a PCN reaction that required hospitalization: No Has patient had a PCN reaction occurring within the last 10 years: No If all of the above answers are "NO", then may proceed with Cephalosporin use.       Social History   Socioeconomic History  . Marital status: Single    Spouse name: Not on file  . Number of children: Not on file  . Years of education: Not on file  . Highest education level: Not on file  Occupational History  . Not on file  Social Needs  . Financial resource strain: Not on file  . Food insecurity:    Worry: Not on file    Inability: Not on file  . Transportation needs:    Medical: Not on file    Non-medical: Not on file  Tobacco Use  . Smoking status: Never Smoker   . Smokeless tobacco: Never Used  Substance and Sexual Activity  . Alcohol use: No  . Drug use: No  . Sexual activity: Not on file  Lifestyle  . Physical activity:    Days per week: Not on file    Minutes per session: Not on file  . Stress: Not on file  Relationships  . Social connections:    Talks on phone: Not on file    Gets together: Not on file    Attends religious service: Not on file    Active member of club or organization: Not on file    Attends meetings of clubs or organizations: Not on file    Relationship status: Not on file  . Intimate partner violence:    Fear of current or ex partner: Not on file    Emotionally abused: Not on file    Physically abused: Not on file  Forced sexual activity: Not on file  Other Topics Concern  . Not on file  Social History Narrative  . Not on file    Physical Exam  Constitutional: She is oriented to person, place, and time.  Cardiovascular: Normal rate and regular rhythm.  Pulmonary/Chest: Effort normal and breath sounds normal.  Abdominal: Soft.  Musculoskeletal: Normal range of motion. She exhibits no edema.  Neurological: She is alert and oriented to person, place, and time.  Skin: Skin is warm and dry.        Future Appointments  Date Time Provider [place]  05/08/2018 10:00 AM [place] [facility] None  05/12/2018  2:00 PM [place], Sharyn Lull, MD [facility]    BP 126/70 (BP Location: Left Arm, Patient Position: Sitting, Cuff Size: Large)   Pulse 84   Resp 16   Wt 199 lb (90.3 kg)   SpO2 96%   BMI 35.25 kg/m   Weight yesterday- 197 lb Last visit weight- 199 lb  Ms Crichlow was seen at home today and reported feeling well. She denied SOB, headache, dizziness or orthopnea. She has bee compliant with her medications since I last saw her and her weight has been stable. Her medications were verified and her pillbox was refilled. Nothing further was necessary during the visit today.    [name], EMT 05/07/18  ACTION: Home visit completed Next visit planned for 2 week

## 2018-05-07 NOTE — Telephone Encounter (Signed)
I called Michelle Andrade to let her know I would not be able to make it by 15:00 and asked if she would be able to see me later in the afternoon. She advised that any time was good with her so I let her know it would be closer to 16:00. She was agreeable.

## 2018-05-08 ENCOUNTER — Encounter: Payer: Medicare Other | Admitting: *Deleted

## 2018-05-12 ENCOUNTER — Ambulatory Visit (INDEPENDENT_AMBULATORY_CARE_PROVIDER_SITE_OTHER): Payer: Medicare Other | Admitting: Family Medicine

## 2018-05-12 ENCOUNTER — Encounter: Payer: Self-pay | Admitting: Family Medicine

## 2018-05-12 VITALS — BP 126/77 | HR 78 | Ht 63.0 in | Wt 201.0 lb

## 2018-05-12 DIAGNOSIS — M10079 Idiopathic gout, unspecified ankle and foot: Secondary | ICD-10-CM | POA: Diagnosis present

## 2018-05-12 MED ORDER — PREDNISONE 10 MG PO TABS: ORAL_TABLET | ORAL | 0 refills | Status: DC

## 2018-05-12 NOTE — Patient Instructions (Signed)
Start prednisone dose pack x 6 days - take until this is gone. We will check your uric acid level today - we'll call you with the results but also send to Dr. Marjo Bicker. Hopefully once this flare is over you can take the allopurinol and prevent these issues. Follow up with me in 1 month for reevaluation.

## 2018-05-13 LAB — URIC ACID: URIC ACID: 8.6 mg/dL — AB (ref 2.5–7.1)

## 2018-05-16 ENCOUNTER — Encounter: Payer: Self-pay | Admitting: Family Medicine

## 2018-05-16 NOTE — Progress Notes (Signed)
PCP: Henderson Baltimore, [name]  Subjective:   HPI: Patient is a 57 y.o. female here for bilateral knee pain.  7/30: Patient reports she's had about 3 weeks of pain in both knees. Pain level is 3/10 in both knees, sharp, worse with walking though she states the left knee is slightly worse than the right. Started with pain in her right great toe - rested and iced and this has improved but not completely. She does have history of gout. She's tried aspirin 81mg  which she takes daily and diclofenac gel. Associated swelling. No skin changes, numbness.  9/9: Patient reports overall she is doing well. States recently she has developed pain in her bilateral feet while her knees have improved. Pain is about an 8 out of 10 and sharp in the left foot into the base of the great toe. Pain is only a 2 out of 10 on the right side. Denies any pain in either knee. She has been taking colchicine and Voltaren gel. No numbness. Reports having had some redness in both feet with warmth.  Past Medical History:  Diagnosis Date  . Arthritis 02/08/2017  . 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 CAD (coronary artery disease) 09/01/2013  . CHF (congestive heart failure) ([place]) 06/07/2015  . CKD (chronic kidney disease), stage III ([place]) 12/09/2013  . Depression 07/27/2013  . Diabetes mellitus without complication ([place]) 01/75/1025  . GERD (gastroesophageal reflux disease) 07/27/2013  . Gout 12/29/2013  . Heart valve disorder 06/03/2013  . High cholesterol 11/10/2013  . Hypertension 11/10/2013  . IBS (irritable bowel syndrome) 09/27/2014  . Myocardial infarct ([place])   . Sleep apnea 10/13/2013  . Vascular disorder 01/12/2014    Current Outpatient Medications on File Prior to Visit  Medication Sig Dispense Refill  . amiodarone (PACERONE) 200 MG tablet Take 200 mg by mouth daily.    [name] aspirin 81 MG chewable tablet Chew 81 mg by mouth daily.    . Calcium  Carbonate-Vitamin D 600-400 MG-UNIT tablet Take by mouth.    . Calcium Carbonate-Vitamin D3 (CALCIUM 600/VITAMIN D) 600-400 MG-UNIT TABS Take 1 tablet by mouth 2 (two) times daily.    . carvedilol (COREG) 25 MG tablet Take 12.5 mg by mouth 2 (two) times daily with a meal.    . diclofenac sodium (VOLTAREN) 1 % GEL Apply 2 g topically 4 (four) times daily. 100 g 0  . ferrous sulfate 325 (65 FE) MG tablet Take 325 mg by mouth 3 (three) times daily.     [name] glipiZIDE (GLUCOTROL) 5 MG tablet Take 2.5 mg by mouth daily before breakfast.    . hydrALAZINE (APRESOLINE) 50 MG tablet Take 50 mg by mouth 3 (three) times daily.    . Investigational - Study Medication Take 1 tablet by mouth 2 (two) times daily. Study name: Galactic Heart Failure Study Additional study details: Omecamtiv Mecarbil or Placebo 1 each PRN  . isosorbide mononitrate (IMDUR) 60 MG 24 hr tablet Take 1 tablet (60 mg total) by mouth daily. 30 tablet 0  . loratadine (CLARITIN) 10 MG tablet Take 10 mg by mouth daily.    . Magnesium Oxide 420 MG TABS Take 840 mg by mouth daily.    . metolazone (ZAROXOLYN) 2.5 MG tablet Take 1 tablet (2.5 mg total) by mouth once a week. Monday 12 tablet 0  . Multiple Vitamin (MULTIVITAMIN) tablet Take 1 tablet by mouth daily.    [name] omeprazole (PRILOSEC) 40 MG capsule Take 40 mg by mouth daily.    [name]  potassium chloride (K-DUR,KLOR-CON) 10 MEQ tablet Take 4 tablets (40 mEq total) by mouth 2 (two) times daily. 240 tablet 11  . rOPINIRole (REQUIP) 0.5 MG tablet Take 0.5 mg by mouth at bedtime.     . sertraline (ZOLOFT) 100 MG tablet Take 150 mg by mouth daily.    . simvastatin (ZOCOR) 20 MG tablet Take 20 mg by mouth daily.    [name] spironolactone (ALDACTONE) 25 MG tablet Take 12.5 mg by mouth daily.    [name] torsemide (DEMADEX) 20 MG tablet Take 1 tablet (20 mg total) by mouth 2 (two) times daily. 120 tablet 3   No current facility-administered medications on file prior to visit.     Past Surgical History:  Procedure  Laterality Date  . ABDOMINAL HYSTERECTOMY    . CARDIAC DEFIBRILLATOR PLACEMENT    . CARPAL TUNNEL RELEASE    . HEEL SPUR EXCISION    . KNEE SURGERY    . PACEMAKER GENERATOR CHANGE    . RIGHT HEART CATH N/A 02/13/2017   Procedure: Right Heart Cath;  Surgeon: Jolaine Artist, MD;  Location: [place];  Service: Cardiovascular;  Laterality: N/A;  . SHOULDER SURGERY      Allergies  Allergen Reactions  . Codeine Itching  . Gabapentin Itching  . Haldol [Haloperidol Lactate] Other (See Comments)    Dizziness, hallucinations  . Levofloxacin     Other reaction(s): Other (See Comments) Projectile vomiting   . Lisinopril Itching and Swelling  . Losartan Itching  . Morphine And Related Nausea And Vomiting  . Penicillins Nausea And Vomiting    "Vomiting, upset stomach, Headache" Has patient had a PCN reaction causing immediate rash, facial/tongue/throat swelling, SOB or lightheadedness with hypotension: No Has patient had a PCN reaction causing severe rash involving mucus membranes or skin necrosis: No Has patient had a PCN reaction that required hospitalization: No Has patient had a PCN reaction occurring within the last 10 years: No If all of the above answers are "NO", then may proceed with Cephalosporin use.     Social History   Socioeconomic History  . Marital status: Single    Spouse name: Not on file  . Number of children: Not on file  . Years of education: Not on file  . Highest education level: Not on file  Occupational History  . Not on file  Social Needs  . Financial resource strain: Not on file  . Food insecurity:    Worry: Not on file    Inability: Not on file  . Transportation needs:    Medical: Not on file    Non-medical: Not on file  Tobacco Use  . Smoking status: Never Smoker  . Smokeless tobacco: Never Used  Substance and Sexual Activity  . Alcohol use: No  . Drug use: No  . Sexual activity: Not on file  Lifestyle  . Physical activity:     Days per week: Not on file    Minutes per session: Not on file  . Stress: Not on file  Relationships  . Social connections:    Talks on phone: Not on file    Gets together: Not on file    Attends religious service: Not on file    Active member of club or organization: Not on file    Attends meetings of clubs or organizations: Not on file    Relationship status: Not on file  . Intimate partner violence:    Fear of current or ex partner: Not on file  Emotionally abused: Not on file    Physically abused: Not on file    Forced sexual activity: Not on file  Other Topics Concern  . Not on file  Social History Narrative  . Not on file    Family History  Problem Relation Age of Onset  . Hypertension Mother     BP 126/77   Pulse 78   Ht 5\' 3"  (1.6 m)   Wt 201 lb (91.2 kg)   BMI 35.61 kg/m   Review of Systems: See HPI above.     Objective:  Physical Exam:  Gen: NAD, comfortable in exam room  Right foot/ankle: No gross deformity, swelling, ecchymoses.  Minimal warmth about 1st MTP.  No redness. FROM ankle with 5/5 strength.  Pain on passive motion of 1st MTP. TTP Negative ant drawer and talar tilt.   Negative syndesmotic compression. Thompsons test negative. NV intact distally.  Left foot/ankle: No gross deformity, swelling, ecchymoses.  Minimal warmth about 1st MTP.  No redness. FROM ankle with 5/5 strength.  Pain on passive motion of 1st MTP. TTP Negative ant drawer and talar tilt.   Negative syndesmotic compression. Thompsons test negative. NV intact distally.  Assessment & Plan:  1. Bilateral foot pain - history consistent with gout flare, pain is severe on left foot though exam is mostly reassuring with only mild warmth, pain with passive motion of 1st MTPs.  Will start prednisone dose pack.  Checking uric acid level - she would like [place] to send this to her PCP for long-term management so we will do so.  F/u in 1 month.

## 2018-05-21 ENCOUNTER — Emergency Department (HOSPITAL_BASED_OUTPATIENT_CLINIC_OR_DEPARTMENT_OTHER)
Admission: EM | Admit: 2018-05-21 | Discharge: 2018-05-21 | Disposition: A | Discharge status: Home / Self Care | Payer: Medicare Other | Attending: Emergency Medicine | Admitting: Emergency Medicine

## 2018-05-21 ENCOUNTER — Encounter (HOSPITAL_BASED_OUTPATIENT_CLINIC_OR_DEPARTMENT_OTHER): Payer: Self-pay

## 2018-05-21 ENCOUNTER — Emergency Department (HOSPITAL_BASED_OUTPATIENT_CLINIC_OR_DEPARTMENT_OTHER): Payer: Medicare Other

## 2018-05-21 ENCOUNTER — Telehealth (HOSPITAL_COMMUNITY): Payer: Self-pay

## 2018-05-21 DIAGNOSIS — I5023 Acute on chronic systolic (congestive) heart failure: Secondary | ICD-10-CM | POA: Insufficient documentation

## 2018-05-21 DIAGNOSIS — R519 Headache, unspecified: Secondary | ICD-10-CM

## 2018-05-21 DIAGNOSIS — Z95810 Presence of automatic (implantable) cardiac defibrillator: Secondary | ICD-10-CM | POA: Insufficient documentation

## 2018-05-21 DIAGNOSIS — R51 Headache: Secondary | ICD-10-CM | POA: Insufficient documentation

## 2018-05-21 DIAGNOSIS — Z7984 Long term (current) use of oral hypoglycemic drugs: Secondary | ICD-10-CM | POA: Insufficient documentation

## 2018-05-21 DIAGNOSIS — N183 Chronic kidney disease, stage 3 (moderate): Secondary | ICD-10-CM | POA: Diagnosis not present

## 2018-05-21 DIAGNOSIS — I2510 Atherosclerotic heart disease of native coronary artery without angina pectoris: Secondary | ICD-10-CM | POA: Diagnosis not present

## 2018-05-21 DIAGNOSIS — I252 Old myocardial infarction: Secondary | ICD-10-CM | POA: Diagnosis not present

## 2018-05-21 DIAGNOSIS — Z79899 Other long term (current) drug therapy: Secondary | ICD-10-CM | POA: Diagnosis not present

## 2018-05-21 DIAGNOSIS — Z7982 Long term (current) use of aspirin: Secondary | ICD-10-CM | POA: Insufficient documentation

## 2018-05-21 DIAGNOSIS — B029 Zoster without complications: Secondary | ICD-10-CM | POA: Insufficient documentation

## 2018-05-21 DIAGNOSIS — E1122 Type 2 diabetes mellitus with diabetic chronic kidney disease: Secondary | ICD-10-CM | POA: Diagnosis not present

## 2018-05-21 DIAGNOSIS — I130 Hypertensive heart and chronic kidney disease with heart failure and stage 1 through stage 4 chronic kidney disease, or unspecified chronic kidney disease: Secondary | ICD-10-CM | POA: Insufficient documentation

## 2018-05-21 MED ORDER — SODIUM CHLORIDE 0.9 % IV BOLUS
500.0000 mL | Freq: Once | INTRAVENOUS | Status: AC
  Administered 2018-05-21: 500 mL via INTRAVENOUS

## 2018-05-21 MED ORDER — KETOROLAC TROMETHAMINE 15 MG/ML IJ SOLN
30.0000 mg | Freq: Once | INTRAMUSCULAR | Status: AC
  Administered 2018-05-21: 15 mg via INTRAVENOUS
  Filled 2018-05-21: qty 2

## 2018-05-21 MED ORDER — ACYCLOVIR 800 MG PO TABS: 800.0000 mg | ORAL_TABLET | Freq: Every day | ORAL | 0 refills | Status: AC

## 2018-05-21 MED ORDER — PROCHLORPERAZINE EDISYLATE 10 MG/2ML IJ SOLN
10.0000 mg | Freq: Once | INTRAMUSCULAR | Status: AC
  Administered 2018-05-21: 10 mg via INTRAVENOUS
  Filled 2018-05-21: qty 2

## 2018-05-21 MED ORDER — DIPHENHYDRAMINE HCL 50 MG/ML IJ SOLN
25.0000 mg | Freq: Once | INTRAMUSCULAR | Status: AC
  Administered 2018-05-21: 25 mg via INTRAVENOUS
  Filled 2018-05-21: qty 1

## 2018-05-21 MED FILL — ACYCLOVIR 800 MG TABLET: 800 | 7 days supply | Qty: 35 | Fill #0

## 2018-05-21 NOTE — ED Notes (Signed)
ED Provider at bedside. 

## 2018-05-21 NOTE — ED Notes (Signed)
Family at bedside. 

## 2018-05-21 NOTE — ED Provider Notes (Signed)
[facility] EMERGENCY DEPARTMENT Provider Note   CSN: [id number] Arrival date & time: 05/21/18  0913     History   Chief Complaint Chief Complaint  Patient presents with  . Headache    HPI Michelle Andrade is a 57 y.o. female.  HPI   Patient is a 57 year old female with a history of CAD, CHF, CKD, T2DM, GERD, gout, hypertension, who presents emergency department today for evaluation of a headache.  Patient states that for the last 3 days she has had a headache.  States headache began suddenly while walking down a hill.  Denies worst headache of life and states that it is mild right now and seems to have improved since onset.  She took aspirin today which mildly improved her symptoms.  Headache is worse when standing or when trying to have a bowel movement.  She also reports right-sided shoulder and arm pain has been ongoing for the same time.  States she has chronic positional lightheadedness/dizziness at baseline due to her CHF medications. Reports blurred vision to bilateral eyes.  No numbness or weakness to the arms or legs.  No difficulty with word finding.  No confusion or slurred speech.  Also reports a burning, itchy rash to her right arm and right upper back. States she was at a church function that was outdoors prior to the onset of the rash.   Past Medical History:  Diagnosis Date  . Arthritis 02/08/2017  . 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 CAD (coronary artery disease) 09/01/2013  . CHF (congestive heart failure) ([place]) 06/07/2015  . CKD (chronic kidney disease), stage III ([place]) 12/09/2013  . Depression 07/27/2013  . Diabetes mellitus without complication ([place]) 14/43/1540  . GERD (gastroesophageal reflux disease) 07/27/2013  . Gout 12/29/2013  . Heart valve disorder 06/03/2013  . High cholesterol 11/10/2013  . Hypertension 11/10/2013  . IBS (irritable bowel syndrome) 09/27/2014  . Myocardial infarct ([place])   .  Sleep apnea 10/13/2013  . Vascular disorder 01/12/2014    Patient Active Problem List   Diagnosis Date Noted  . Secondary hyperparathyroidism of renal origin ([place]) 03/20/2018  . Severe obesity (BMI 35.0-39.9) with comorbidity ([place]) 12/19/2017  . CKD (chronic kidney disease) stage 4, GFR 15-29 ml/min (HCC) 06/07/2017  . Acute on chronic systolic (congestive) heart failure ([place])   . CHF (congestive heart failure) ([place]) 02/09/2017  . Pulmonary vascular congestion 02/09/2017  . Hypertension   . Sleep apnea   . Chondrocalcinosis of both knees 02/08/2017  . Primary osteoarthritis of both knees 02/08/2017  . Type 2 diabetes mellitus ([place]) 11/16/2016  . Borderline personality disorder ([place]) 10/17/2016  . Carpal tunnel syndrome 10/17/2016  . 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 Eczema 07/03/2016  . Atrial tachycardia ([place]) 05/31/2016  . 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 Onychomycosis of toenail 06/08/2015  . Cardiomyopathy ([place]) 06/07/2015  . Irritable bowel syndrome 09/27/2014  . PAD (peripheral artery disease) ([place]) 01/12/2014  . History of DVT (deep vein thrombosis) 01/12/2014  . Gout 12/29/2013  . Iron deficiency anemia 12/29/2013  . Lichenification and lichen simplex chronicus 12/26/2013  . Hyperlipidemia associated with type 2 diabetes mellitus ([place]) 11/10/2013  . Benign hypertension with chronic kidney disease, stage IV ([place]) 11/10/2013  . Disorder of magnesium metabolism 10/13/2013  . CAD in native artery 09/01/2013  . Anxiety state 07/27/2013  . Depressive disorder 07/27/2013  . Gastro-esophageal reflux disease without esophagitis 07/27/2013  .  Low back pain 06/03/2013  . Mitral valve disease 06/03/2013  . Pulmonary valve disorder 06/03/2013  . Tricuspid valve disorder 06/03/2013  . 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 Allergic rhinitis 02/07/2013  . 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 Pain in joint 02/07/2013    Past Surgical History:  Procedure Laterality Date  . ABDOMINAL HYSTERECTOMY    . CARDIAC DEFIBRILLATOR PLACEMENT    . CARPAL TUNNEL RELEASE    . HEEL SPUR EXCISION    . KNEE SURGERY    . PACEMAKER GENERATOR CHANGE    . RIGHT HEART CATH N/A 02/13/2017   Procedure: Right Heart Cath;  Surgeon: Jolaine Artist, MD;  Location: [facility];  Service: Cardiovascular;  Laterality: N/A;  . SHOULDER SURGERY       OB History   None      Home Medications    Prior to Admission medications   Medication Sig Start Date End Date Taking? Authorizing Provider  acyclovir (ZOVIRAX) 800 MG tablet Take 1 tablet (800 mg total) by mouth 5 (five) times daily for 7 days. 05/21/18 05/28/18  Haylee Mcanany S, PA-C  amiodarone (PACERONE) 200 MG tablet Take 200 mg by mouth daily.    [provider]  aspirin 81 MG chewable tablet Chew 81 mg by mouth daily.    [provider]  Calcium Carbonate-Vitamin D 600-400 MG-UNIT tablet Take by mouth. 07/14/12   [provider]  Calcium Carbonate-Vitamin D3 (CALCIUM 600/VITAMIN D) 600-400 MG-UNIT TABS Take 1 tablet by mouth 2 (two) times daily.    [provider]  carvedilol (COREG) 25 MG tablet Take 12.5 mg by mouth 2 (two) times daily with a meal.    [provider]  diclofenac sodium (VOLTAREN) 1 % GEL Apply 2 g topically 4 (four) times daily. 03/27/18   Caccavale, Sophia, PA-C  ferrous sulfate 325 (65 FE) MG tablet Take 325 mg by mouth 3 (three) times daily.     [provider]  glipiZIDE (GLUCOTROL) 5 MG tablet Take 2.5 mg by mouth daily before breakfast.    [provider]  hydrALAZINE (APRESOLINE) 50 MG tablet Take 50 mg by mouth 3 (three) times daily.    [provider]  Investigational - Study Medication Take 1 tablet by mouth 2 (two) times daily. Study name: Galactic Heart Failure Study Additional study details: Omecamtiv Mecarbil  or Placebo 02/15/17   Larey Dresser, MD  isosorbide mononitrate (IMDUR) 60 MG 24 hr tablet Take 1 tablet (60 mg total) by mouth daily. 02/16/17   Mariel Aloe, MD  loratadine (CLARITIN) 10 MG tablet Take 10 mg by mouth daily.    [provider]  Magnesium Oxide 420 MG TABS Take 840 mg by mouth daily.    [provider]  metolazone (ZAROXOLYN) 2.5 MG tablet Take 1 tablet (2.5 mg total) by mouth once a week. Monday 11/21/17 05/07/18  Bensimhon, Shaune Pascal, MD  Multiple Vitamin (MULTIVITAMIN) tablet Take 1 tablet by mouth daily.    [provider]  omeprazole (PRILOSEC) 40 MG capsule Take 40 mg by mouth daily.    [provider]  potassium chloride (K-DUR,KLOR-CON) 10 MEQ tablet Take 4 tablets (40 mEq total) by mouth 2 (two) times daily. 03/12/18   Clegg, Amy D, NP  predniSONE (DELTASONE) 10 MG tablet 6 tabs po day 1, 5 tabs po day 2, 4 tabs po day 3, 3 tabs po day 4, 2 tabs po day 5, 1 tab po day 6 05/12/18  Dene Gentry, MD  rOPINIRole (REQUIP) 0.5 MG tablet Take 0.5 mg by mouth at bedtime.     [provider]  sertraline (ZOLOFT) 100 MG tablet Take 150 mg by mouth daily.    [provider]  simvastatin (ZOCOR) 20 MG tablet Take 20 mg by mouth daily.    [provider]  spironolactone (ALDACTONE) 25 MG tablet Take 12.5 mg by mouth daily.    [provider]  torsemide (DEMADEX) 20 MG tablet Take 1 tablet (20 mg total) by mouth 2 (two) times daily. 04/11/17   Shirley Friar, PA-C    Family History Family History  Problem Relation Age of Onset  . Hypertension Mother     Social History Social History   Tobacco Use  . Smoking status: Never Smoker  . Smokeless tobacco: Never Used  Substance Use Topics  . Alcohol use: No  . Drug use: No     Allergies   Codeine; Gabapentin; Haldol [haloperidol lactate]; Levofloxacin; Lisinopril; Losartan; Morphine and related; and Penicillins   Review of Systems Review of  Systems  Constitutional: Negative for chills and fever.  HENT: Negative for sore throat.   Eyes: Positive for visual disturbance. Negative for pain.  Respiratory: Negative for cough and shortness of breath.   Cardiovascular: Negative for chest pain and leg swelling.  Gastrointestinal: Positive for nausea. Negative for abdominal pain, constipation, diarrhea and vomiting.  Genitourinary: Negative for flank pain.  Musculoskeletal:       Right shoulder and right arm pain  Skin: Positive for rash.  Neurological: Positive for light-headedness (chronic) and headaches. Negative for weakness and numbness.     Physical Exam Updated Vital Signs BP 127/75 (BP Location: Left Arm)   Pulse 62   Temp 98.1 F (36.7 C) (Oral)   Resp 18   SpO2 94%   Physical Exam  Constitutional: She is oriented to person, place, and time. She appears well-developed and well-nourished. She does not appear ill. No distress.  HENT:  Head: Normocephalic and atraumatic.  Mouth/Throat: Oropharynx is clear and moist.  Eyes: Pupils are equal, round, and reactive to light. Conjunctivae and EOM are normal. Right eye exhibits no nystagmus. Left eye exhibits no nystagmus.  Neck: Neck supple.  Cardiovascular: Normal rate, regular rhythm and normal heart sounds.  No murmur heard. Pulmonary/Chest: Effort normal and breath sounds normal. No stridor. No respiratory distress. She has no wheezes.  Abdominal: Soft.  Musculoskeletal: She exhibits no edema.  TTP and muscle spasm to the right cervical paraspinous muscles and right trapezius muscles  Neurological: She is alert and oriented to person, place, and time.  Mental Status:  Alert, thought content appropriate, able to give a coherent history. Speech fluent without evidence of aphasia. Able to follow 2 step commands without difficulty.  Cranial Nerves:  II:  Peripheral visual fields grossly normal, pupils equal, round, reactive to light III,IV, VI: ptosis not present,  extra-ocular motions intact bilaterally  V,VII: smile symmetric, facial light touch sensation equal VIII: hearing grossly normal to voice  X: uvula elevates symmetrically  XI: bilateral shoulder shrug symmetric and strong XII: midline tongue extension without fassiculations Motor:  Normal tone. 5/5 strength of BUE and BLE major muscle groups including strong and equal grip strength and dorsiflexion/plantar flexion Sensory: light touch normal in all extremities. Cerebellar: normal finger-to-nose with bilateral upper extremities Gait: gait is grossly steady though becomes dizzy when turning + romberg  Skin: Skin is warm and dry. Capillary refill takes less than 2  seconds.  Erythematous raised papular rash to forearm of RUE. Area of rash is about 3cm x 3cm. Consistent with contact dermatitis.  Psychiatric: She has a normal mood and affect.  Nursing note and vitals reviewed.   ED Treatments / Results  Labs (all labs ordered are listed, but only abnormal results are displayed) Labs Reviewed - No data to display  EKG None  Radiology Ct Head Wo Contrast  Result Date: 05/21/2018 CLINICAL DATA:  Headache, blurred vision EXAM: CT HEAD WITHOUT CONTRAST TECHNIQUE: Contiguous axial images were obtained from the base of the skull through the vertex without intravenous contrast. COMPARISON:  None. FINDINGS: Brain: No acute intracranial abnormality. Specifically, no hemorrhage, hydrocephalus, mass lesion, acute infarction, or significant intracranial injury. Vascular: No hyperdense vessel or unexpected calcification. Skull: No acute calvarial abnormality. Sinuses/Orbits: Visualized paranasal sinuses and mastoids clear. Orbital soft tissues unremarkable. Other: None IMPRESSION: Normal head CT. Electronically Signed   By: Rolm Baptise M.D.   On: 05/21/2018 10:54    Procedures Procedures (including critical care time)  Medications Ordered in ED Medications  ketorolac (TORADOL) 15 MG/ML injection 30  mg (15 mg Intravenous Given 05/21/18 1100)  prochlorperazine (COMPAZINE) injection 10 mg (10 mg Intravenous Given 05/21/18 1103)  diphenhydrAMINE (BENADRYL) injection 25 mg (25 mg Intravenous Given 05/21/18 1103)  sodium chloride 0.9 % bolus 500 mL (0 mLs Intravenous Stopped 05/21/18 1140)     Initial Impression / Assessment and Plan / ED Course  I have reviewed the triage vital signs and the nursing notes.  Pertinent labs & imaging results that were available during my care of the patient were reviewed by me and considered in my medical decision making (see chart for details).    Discussed pt presentation and exam findings with Dr. Tyrone Nine, who evaluated the pt and recommends consulting neurology.  1:26 PM CONSULT with Dr. Lorraine Lax who did not feel that further imaging is currently necessary given patients improvement of symptoms after migraine cocktail.   Final Clinical Impressions(s) / ED Diagnoses   Final diagnoses:  Acute nonintractable headache, unspecified headache type  Herpes zoster without complication   Presented to the ED today complaining of a headache that began 3 days ago.  Is associated with blurred vision.  She also has dizziness/positional lightheadedness.  Also with rash to RUE. Vitals stable.  Afebrile.  Patient initially had positive Romberg and some ataxia while turning when she walked.  She was given a migraine cocktail in the ED which improved her headache.    Repeat neuro exam completed after migraine cocktail.  At this time patient had negative Romberg sign and had steady gait. Also states that her blurred vision improved and her right shoulder pain improved.   With regard to rash, rash consistent with shingles. No ophthalmologic involvement and no involvement of the TM.  Consulted with neurology as above who did not recommend further imaging at this time.   Pt appears reasonably screened and does not show evidence of any emergent pathology at this time that would  require further imaging, workup or admission to the hospital.  Will tx patient with antivirals for her shingles and have her f/u with neurology as an outpatient. Have advised her to return to the ER for any new or worsening sxs.  Pt understands plan and reason to return. All questions answered.  ED Discharge Orders         Ordered    acyclovir (ZOVIRAX) 800 MG tablet  5 times daily     05/21/18  1332    Ambulatory referral to Neurology    Comments:  An appointment is requested in approximately: 1 week   05/21/18 1334           Grisela Mesch S, PA-C 05/21/18 [place], DO 05/21/18 1431

## 2018-05-21 NOTE — ED Notes (Signed)
Patient transported to CT 

## 2018-05-21 NOTE — Telephone Encounter (Signed)
I called Michelle Andrade to schedule an appointment but she did not answer. I left a voicemail requesting she call me back when she is able.

## 2018-05-21 NOTE — Discharge Instructions (Signed)
You were given a referral to neurology office.  They will contact you in regards to making an appointment for follow-up.  Please take the medication to treat the rash on your arm.  Please also make an appointment with your primary doctor in 1 week to follow-up.  Please return to the ER for any new or worsening symptoms including fevers, chills, if the rash burns to your eye, or if the rash spreads to your face, persistent headaches, numbness, weakness or worsening vision changes.

## 2018-05-21 NOTE — ED Triage Notes (Signed)
Pt c/o headache since Sunday, worse worse when standing up and trying to have a BM

## 2018-05-22 ENCOUNTER — Other Ambulatory Visit (HOSPITAL_COMMUNITY): Payer: Self-pay

## 2018-05-22 NOTE — Progress Notes (Signed)
Paramedicine Encounter    Patient ID: Michelle Andrade, female    DOB: Dec 02, 1960, 57 y.o.   MRN: [id number]   Patient Care Team: Henderson Baltimore, [name] as PCP - General (Nurse Practitioner)  Patient Active Problem List   Diagnosis Date Noted  . Secondary hyperparathyroidism of renal origin ([place]) 03/20/2018  . Severe obesity (BMI 35.0-39.9) with comorbidity ([place]) 12/19/2017  . CKD (chronic kidney disease) stage 4, GFR 15-29 ml/min (HCC) 06/07/2017  . Acute on chronic systolic (congestive) heart failure ([place])   . CHF (congestive heart failure) ([place]) 02/09/2017  . Pulmonary vascular congestion 02/09/2017  . Hypertension   . Sleep apnea   . Chondrocalcinosis of both knees 02/08/2017  . Primary osteoarthritis of both knees 02/08/2017  . Type 2 diabetes mellitus ([place]) 11/16/2016  . Borderline personality disorder ([place]) 10/17/2016  . Carpal tunnel syndrome 10/17/2016  . 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 Eczema 07/03/2016  . Atrial tachycardia ([place]) 05/31/2016  . 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 Onychomycosis of toenail 06/08/2015  . Cardiomyopathy ([place]) 06/07/2015  . Irritable bowel syndrome 09/27/2014  . PAD (peripheral artery disease) ([place]) 01/12/2014  . History of DVT (deep vein thrombosis) 01/12/2014  . Gout 12/29/2013  . Iron deficiency anemia 12/29/2013  . Lichenification and lichen simplex chronicus 12/26/2013  . Hyperlipidemia associated with type 2 diabetes mellitus ([place]) 11/10/2013  . Benign hypertension with chronic kidney disease, stage IV ([place]) 11/10/2013  . Disorder of magnesium metabolism 10/13/2013  . CAD in native artery 09/01/2013  . Anxiety state 07/27/2013  . Depressive disorder 07/27/2013  . Gastro-esophageal reflux disease without esophagitis 07/27/2013  . Low back pain 06/03/2013  . Mitral valve disease 06/03/2013  . Pulmonary valve disorder 06/03/2013   . Tricuspid valve disorder 06/03/2013  . 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 Allergic rhinitis 02/07/2013  . 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 Pain in joint 02/07/2013    Current Outpatient Medications:  .  acyclovir (ZOVIRAX) 800 MG tablet, Take 1 tablet (800 mg total) by mouth 5 (five) times daily for 7 days., Disp: 35 tablet, Rfl: 0 .  amiodarone (PACERONE) 200 MG tablet, Take 200 mg by mouth daily., Disp: , Rfl:  .  aspirin 81 MG chewable tablet, Chew 81 mg by mouth daily., Disp: , Rfl:  .  Calcium Carbonate-Vitamin D3 (CALCIUM 600/VITAMIN D) 600-400 MG-UNIT TABS, Take 1 tablet by mouth 2 (two) times daily., Disp: , Rfl:  .  carvedilol (COREG) 25 MG tablet, Take 12.5 mg by mouth 2 (two) times daily with a meal., Disp: , Rfl:  .  diclofenac sodium (VOLTAREN) 1 % GEL, Apply 2 g topically 4 (four) times daily., Disp: 100 g, Rfl: 0 .  ferrous sulfate 325 (65 FE) MG tablet, Take 325 mg by mouth 3 (three) times daily. , Disp: , Rfl:  .  glipiZIDE (GLUCOTROL) 5 MG tablet, Take 2.5 mg by mouth daily before breakfast., Disp: , Rfl:  .  hydrALAZINE (APRESOLINE) 50 MG tablet, Take 50 mg by mouth 3 (three) times daily., Disp: , Rfl:  .  Investigational - Study Medication, Take 1 tablet by mouth 2 (two) times daily. Study name: Galactic Heart Failure Study Additional study details: Omecamtiv Mecarbil or Placebo, Disp: 1 each, Rfl: PRN .  isosorbide mononitrate (IMDUR) 60 MG 24 hr tablet, Take 1 tablet (60 mg total) by mouth daily., Disp: 30 tablet, Rfl: 0 .  loratadine (CLARITIN) 10 MG tablet, Take 10 mg by  mouth daily., Disp: , Rfl:  .  Magnesium Oxide 420 MG TABS, Take 840 mg by mouth daily., Disp: , Rfl:  .  metolazone (ZAROXOLYN) 2.5 MG tablet, Take 1 tablet (2.5 mg total) by mouth once a week. Monday, Disp: 12 tablet, Rfl: 0 .  Multiple Vitamin (MULTIVITAMIN) tablet, Take 1 tablet by mouth daily., Disp: , Rfl:  .  omeprazole (PRILOSEC) 40 MG capsule, Take 40 mg by mouth daily., Disp: ,  Rfl:  .  potassium chloride (K-DUR,KLOR-CON) 10 MEQ tablet, Take 4 tablets (40 mEq total) by mouth 2 (two) times daily., Disp: 240 tablet, Rfl: 11 .  rOPINIRole (REQUIP) 0.5 MG tablet, Take 0.5 mg by mouth at bedtime. , Disp: , Rfl:  .  sertraline (ZOLOFT) 100 MG tablet, Take 150 mg by mouth daily., Disp: , Rfl:  .  simvastatin (ZOCOR) 20 MG tablet, Take 20 mg by mouth daily., Disp: , Rfl:  .  spironolactone (ALDACTONE) 25 MG tablet, Take 12.5 mg by mouth daily., Disp: , Rfl:  .  torsemide (DEMADEX) 20 MG tablet, Take 1 tablet (20 mg total) by mouth 2 (two) times daily., Disp: 120 tablet, Rfl: 3 .  Calcium Carbonate-Vitamin D 600-400 MG-UNIT tablet, Take by mouth., Disp: , Rfl:  .  predniSONE (DELTASONE) 10 MG tablet, 6 tabs po day 1, 5 tabs po day 2, 4 tabs po day 3, 3 tabs po day 4, 2 tabs po day 5, 1 tab po day 6 (Patient not taking: Reported on 05/22/2018), Disp: 21 tablet, Rfl: 0 Allergies  Allergen Reactions  . Codeine Itching  . Gabapentin Itching  . Haldol [Haloperidol Lactate] Other (See Comments)    Dizziness, hallucinations  . Levofloxacin     Other reaction(s): Other (See Comments) Projectile vomiting   . Lisinopril Itching and Swelling  . Losartan Itching  . Morphine And Related Nausea And Vomiting  . Penicillins Nausea And Vomiting    "Vomiting, upset stomach, Headache" Has patient had a PCN reaction causing immediate rash, facial/tongue/throat swelling, SOB or lightheadedness with hypotension: No Has patient had a PCN reaction causing severe rash involving mucus membranes or skin necrosis: No Has patient had a PCN reaction that required hospitalization: No Has patient had a PCN reaction occurring within the last 10 years: No If all of the above answers are "NO", then may proceed with Cephalosporin use.       Social History   Socioeconomic History  . Marital status: Single    Spouse name: Not on file  . Number of children: Not on file  . Years of education: Not on  file  . Highest education level: Not on file  Occupational History  . Not on file  Social Needs  . Financial resource strain: Not on file  . Food insecurity:    Worry: Not on file    Inability: Not on file  . Transportation needs:    Medical: Not on file    Non-medical: Not on file  Tobacco Use  . Smoking status: Never Smoker  . Smokeless tobacco: Never Used  Substance and Sexual Activity  . Alcohol use: No  . Drug use: No  . Sexual activity: Not on file  Lifestyle  . Physical activity:    Days per week: Not on file    Minutes per session: Not on file  . Stress: Not on file  Relationships  . Social connections:    Talks on phone: Not on file    Gets together: Not on file  Attends religious service: Not on file    Active member of club or organization: Not on file    Attends meetings of clubs or organizations: Not on file    Relationship status: Not on file  . Intimate partner violence:    Fear of current or ex partner: Not on file    Emotionally abused: Not on file    Physically abused: Not on file    Forced sexual activity: Not on file  Other Topics Concern  . Not on file  Social History Narrative  . Not on file    Physical Exam  Constitutional: She is oriented to person, place, and time.  Cardiovascular: Normal rate and regular rhythm.  Pulmonary/Chest: Effort normal and breath sounds normal.  Abdominal: Soft.  Musculoskeletal: Normal range of motion. She exhibits no edema.  Neurological: She is alert and oriented to person, place, and time.  Skin: Skin is warm and dry.  Psychiatric: She has a normal mood and affect.        Future Appointments  Date Time Provider [facility]  06/12/2018  2:00 PM Dene Gentry, MD [facility]  09/01/2018 10:00 AM [place] [facility] None    BP 132/78 (BP Location: Left Arm, Patient Position: Sitting, Cuff Size: Large)   Pulse 88   Resp 16   Wt 196 lb 12.8 oz (89.3 kg)   SpO2  98%   BMI 34.86 kg/m   Weight yesterday- Did not weigh (she was in the ED) Last visit weight- 199 lb  Michelle Andrade was seen at home today and reported feeling well. She is dealing with a recent diagnosis of shingles but otherwise has no complaints. She has been compliant with her medications since our last visit and has an appointment to see her PCP in the next few days. I asked that she call me if anything changes regarding her medications following that appointment so I could come make adjustments to her pillbox. She was also instructed to order Calcium from the [place], as she will run out in the middle of next week. Her medications were verified and her pillbox was refilled.   [name], EMT 05/22/18  ACTION: Home visit completed Next visit planned for 2 weeks

## 2018-05-26 ENCOUNTER — Ambulatory Visit: Payer: Medicare Other | Admitting: Neurology

## 2018-06-04 ENCOUNTER — Telehealth (HOSPITAL_COMMUNITY): Payer: Self-pay

## 2018-06-04 NOTE — Telephone Encounter (Signed)
I called Michelle Andrade to schedule an appointment. She stated she would be available all day so we agreed to meet at 14:00.

## 2018-06-04 NOTE — Telephone Encounter (Signed)
I received a text message from Michelle Andrade stating that she would not be home at 14:00 because she was picking her brother up in [place]. She advised she would let me know when she is home and ready for me to come by.

## 2018-06-05 ENCOUNTER — Other Ambulatory Visit (HOSPITAL_COMMUNITY): Payer: Self-pay

## 2018-06-05 NOTE — Progress Notes (Signed)
Paramedicine Encounter    Patient ID: Michelle Andrade, female    DOB: Jul 11, 1961, 57 y.o.   MRN: [id number]   Patient Care Team: Henderson Baltimore, [name] as PCP - General (Nurse Practitioner)  Patient Active Problem List   Diagnosis Date Noted  . Secondary hyperparathyroidism of renal origin ([place]) 03/20/2018  . Severe obesity (BMI 35.0-39.9) with comorbidity ([place]) 12/19/2017  . CKD (chronic kidney disease) stage 4, GFR 15-29 ml/min (HCC) 06/07/2017  . Acute on chronic systolic (congestive) heart failure ([place])   . CHF (congestive heart failure) ([place]) 02/09/2017  . Pulmonary vascular congestion 02/09/2017  . Hypertension   . Sleep apnea   . Chondrocalcinosis of both knees 02/08/2017  . Primary osteoarthritis of both knees 02/08/2017  . Type 2 diabetes mellitus ([place]) 11/16/2016  . Borderline personality disorder ([place]) 10/17/2016  . Carpal tunnel syndrome 10/17/2016  . 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 Eczema 07/03/2016  . Atrial tachycardia ([place]) 05/31/2016  . 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 Onychomycosis of toenail 06/08/2015  . Cardiomyopathy ([place]) 06/07/2015  . Irritable bowel syndrome 09/27/2014  . PAD (peripheral artery disease) ([place]) 01/12/2014  . History of DVT (deep vein thrombosis) 01/12/2014  . Gout 12/29/2013  . Iron deficiency anemia 12/29/2013  . Lichenification and lichen simplex chronicus 12/26/2013  . Hyperlipidemia associated with type 2 diabetes mellitus ([place]) 11/10/2013  . Benign hypertension with chronic kidney disease, stage IV ([place]) 11/10/2013  . Disorder of magnesium metabolism 10/13/2013  . CAD in native artery 09/01/2013  . Anxiety state 07/27/2013  . Depressive disorder 07/27/2013  . Gastro-esophageal reflux disease without esophagitis 07/27/2013  . Low back pain 06/03/2013  . Mitral valve disease 06/03/2013  . Pulmonary valve disorder 06/03/2013   . Tricuspid valve disorder 06/03/2013  . 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 Allergic rhinitis 02/07/2013  . 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 Pain in joint 02/07/2013    Current Outpatient Medications:  .  amiodarone (PACERONE) 200 MG tablet, Take 200 mg by mouth daily., Disp: , Rfl:  .  aspirin 81 MG chewable tablet, Chew 81 mg by mouth daily., Disp: , Rfl:  .  Calcium Carbonate-Vitamin D3 (CALCIUM 600/VITAMIN D) 600-400 MG-UNIT TABS, Take 1 tablet by mouth 2 (two) times daily., Disp: , Rfl:  .  carvedilol (COREG) 25 MG tablet, Take 12.5 mg by mouth 2 (two) times daily with a meal., Disp: , Rfl:  .  diclofenac sodium (VOLTAREN) 1 % GEL, Apply 2 g topically 4 (four) times daily., Disp: 100 g, Rfl: 0 .  ferrous sulfate 325 (65 FE) MG tablet, Take 325 mg by mouth 3 (three) times daily. , Disp: , Rfl:  .  glipiZIDE (GLUCOTROL) 5 MG tablet, Take 2.5 mg by mouth daily before breakfast., Disp: , Rfl:  .  hydrALAZINE (APRESOLINE) 50 MG tablet, Take 50 mg by mouth 3 (three) times daily., Disp: , Rfl:  .  Investigational - Study Medication, Take 1 tablet by mouth 2 (two) times daily. Study name: Galactic Heart Failure Study Additional study details: Omecamtiv Mecarbil or Placebo, Disp: 1 each, Rfl: PRN .  isosorbide mononitrate (IMDUR) 60 MG 24 hr tablet, Take 1 tablet (60 mg total) by mouth daily., Disp: 30 tablet, Rfl: 0 .  loratadine (CLARITIN) 10 MG tablet, Take 10 mg by mouth daily., Disp: , Rfl:  .  Magnesium Oxide 420 MG TABS, Take 840 mg by mouth daily., Disp: , Rfl:  .  metolazone (ZAROXOLYN)  2.5 MG tablet, Take 1 tablet (2.5 mg total) by mouth once a week. Monday, Disp: 12 tablet, Rfl: 0 .  Multiple Vitamin (MULTIVITAMIN) tablet, Take 1 tablet by mouth daily., Disp: , Rfl:  .  omeprazole (PRILOSEC) 40 MG capsule, Take 40 mg by mouth daily., Disp: , Rfl:  .  potassium chloride (K-DUR,KLOR-CON) 10 MEQ tablet, Take 4 tablets (40 mEq total) by mouth 2 (two) times daily., Disp: 240  tablet, Rfl: 11 .  rOPINIRole (REQUIP) 0.5 MG tablet, Take 0.5 mg by mouth at bedtime. , Disp: , Rfl:  .  sertraline (ZOLOFT) 100 MG tablet, Take 150 mg by mouth daily., Disp: , Rfl:  .  simvastatin (ZOCOR) 20 MG tablet, Take 20 mg by mouth daily., Disp: , Rfl:  .  spironolactone (ALDACTONE) 25 MG tablet, Take 12.5 mg by mouth daily., Disp: , Rfl:  .  torsemide (DEMADEX) 20 MG tablet, Take 1 tablet (20 mg total) by mouth 2 (two) times daily., Disp: 120 tablet, Rfl: 3 .  Calcium Carbonate-Vitamin D 600-400 MG-UNIT tablet, Take by mouth., Disp: , Rfl:  .  predniSONE (DELTASONE) 10 MG tablet, 6 tabs po day 1, 5 tabs po day 2, 4 tabs po day 3, 3 tabs po day 4, 2 tabs po day 5, 1 tab po day 6 (Patient not taking: Reported on 05/22/2018), Disp: 21 tablet, Rfl: 0 Allergies  Allergen Reactions  . Codeine Itching  . Gabapentin Itching  . Haldol [Haloperidol Lactate] Other (See Comments)    Dizziness, hallucinations  . Levofloxacin     Other reaction(s): Other (See Comments) Projectile vomiting   . Lisinopril Itching and Swelling  . Losartan Itching  . Morphine And Related Nausea And Vomiting  . Penicillins Nausea And Vomiting    "Vomiting, upset stomach, Headache" Has patient had a PCN reaction causing immediate rash, facial/tongue/throat swelling, SOB or lightheadedness with hypotension: No Has patient had a PCN reaction causing severe rash involving mucus membranes or skin necrosis: No Has patient had a PCN reaction that required hospitalization: No Has patient had a PCN reaction occurring within the last 10 years: No If all of the above answers are "NO", then may proceed with Cephalosporin use.       Social History   Socioeconomic History  . Marital status: Single    Spouse name: Not on file  . Number of children: Not on file  . Years of education: Not on file  . Highest education level: Not on file  Occupational History  . Not on file  Social Needs  . Financial resource strain:  Not on file  . Food insecurity:    Worry: Not on file    Inability: Not on file  . Transportation needs:    Medical: Not on file    Non-medical: Not on file  Tobacco Use  . Smoking status: Never Smoker  . Smokeless tobacco: Never Used  Substance and Sexual Activity  . Alcohol use: No  . Drug use: No  . Sexual activity: Not on file  Lifestyle  . Physical activity:    Days per week: Not on file    Minutes per session: Not on file  . Stress: Not on file  Relationships  . Social connections:    Talks on phone: Not on file    Gets together: Not on file    Attends religious service: Not on file    Active member of club or organization: Not on file    Attends meetings of clubs  or organizations: Not on file    Relationship status: Not on file  . Intimate partner violence:    Fear of current or ex partner: Not on file    Emotionally abused: Not on file    Physically abused: Not on file    Forced sexual activity: Not on file  Other Topics Concern  . Not on file  Social History Narrative  . Not on file    Physical Exam  Constitutional: She is oriented to person, place, and time.  Cardiovascular: Normal rate and regular rhythm.  Pulmonary/Chest: Effort normal and breath sounds normal.  Abdominal: Soft. She exhibits no distension.  Musculoskeletal: Normal range of motion. She exhibits no edema.  Neurological: She is alert and oriented to person, place, and time.  Skin: Skin is warm and dry.  Psychiatric: She has a normal mood and affect.        Future Appointments  Date Time Provider [place]  06/12/2018  2:00 PM Dene Gentry, MD [facility]  09/01/2018 10:00 AM [place] [facility] None    BP 130/78 (BP Location: Left Arm, Patient Position: Sitting, Cuff Size: Large)   Pulse 88   Resp 18   Wt 193 lb (87.5 kg)   SpO2 97%   BMI 34.19 kg/m   Weight yesterday- 194 lb Last visit weight- 196 lb  Ms Jaggers was seen at home  today and reported feeling well. She denied SOB, headache, dizziness or orthopnea. She has been compliant with her medications and her weight has been stable over the past two weeks. Her medications were verified and her pillbox was refilled. She ran out of Potassium while I was filling her boxes so I placed a band-aid over the bins which needed it and wrote "2 Potassium pills" on it so she would be able to add them when the are delivered.   [name], EMT 06/05/18  ACTION: Home visit completed Next visit planned for 1 week

## 2018-06-12 ENCOUNTER — Encounter: Payer: Self-pay | Admitting: Family Medicine

## 2018-06-12 ENCOUNTER — Ambulatory Visit (INDEPENDENT_AMBULATORY_CARE_PROVIDER_SITE_OTHER): Payer: Medicare Other | Admitting: Family Medicine

## 2018-06-12 VITALS — BP 121/77 | HR 77 | Ht 62.0 in | Wt 192.0 lb

## 2018-06-12 DIAGNOSIS — B029 Zoster without complications: Secondary | ICD-10-CM | POA: Diagnosis not present

## 2018-06-12 DIAGNOSIS — M10079 Idiopathic gout, unspecified ankle and foot: Secondary | ICD-10-CM | POA: Diagnosis present

## 2018-06-12 NOTE — Progress Notes (Signed)
PCP: Henderson Baltimore, [name]  Subjective:   HPI: Patient is a 57 y.o. female here for bilateral knee pain.  7/30: Patient reports she's had about 3 weeks of pain in both knees. Pain level is 3/10 in both knees, sharp, worse with walking though she states the left knee is slightly worse than the right. Started with pain in her right great toe - rested and iced and this has improved but not completely. She does have history of gout. She's tried aspirin 81mg  which she takes daily and diclofenac gel. Associated swelling. No skin changes, numbness.  9/9: Patient reports overall she is doing well. States recently she has developed pain in her bilateral feet while her knees have improved. Pain is about an 8 out of 10 and sharp in the left foot into the base of the great toe. Pain is only a 2 out of 10 on the right side. Denies any pain in either knee. She has been taking colchicine and Voltaren gel. No numbness. Reports having had some redness in both feet with warmth.  10/10: Pt here for followup for gout in the feet b/l. Foot pain has now improved. No pain at any time. No further swelling or erythema of the 1st MTP. She reports 3 weeks of shingles on her arm. She was initially treated with acyclovir. Pain now resolved and only having mild area of numbness on the ulnar aspect of her forearm. No new vesicles in about a week.  Past Medical History:  Diagnosis Date  . Arthritis 02/08/2017  . 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 CAD (coronary artery disease) 09/01/2013  . CHF (congestive heart failure) ([place]) 06/07/2015  . CKD (chronic kidney disease), stage III ([place]) 12/09/2013  . Depression 07/27/2013  . Diabetes mellitus without complication ([place]) 40/98/1191  . GERD (gastroesophageal reflux disease) 07/27/2013  . Gout 12/29/2013  . Heart valve disorder 06/03/2013  . High cholesterol 11/10/2013  . Hypertension 11/10/2013  . IBS  (irritable bowel syndrome) 09/27/2014  . Myocardial infarct ([place])   . Sleep apnea 10/13/2013  . Vascular disorder 01/12/2014    Current Outpatient Medications on File Prior to Visit  Medication Sig Dispense Refill  . amiodarone (PACERONE) 200 MG tablet Take 200 mg by mouth daily.    [name] aspirin 81 MG chewable tablet Chew 81 mg by mouth daily.    . Calcium Carbonate-Vitamin D 600-400 MG-UNIT tablet Take by mouth.    . Calcium Carbonate-Vitamin D3 (CALCIUM 600/VITAMIN D) 600-400 MG-UNIT TABS Take 1 tablet by mouth 2 (two) times daily.    . carvedilol (COREG) 25 MG tablet Take 12.5 mg by mouth 2 (two) times daily with a meal.    . diclofenac sodium (VOLTAREN) 1 % GEL Apply 2 g topically 4 (four) times daily. 100 g 0  . ferrous sulfate 325 (65 FE) MG tablet Take 325 mg by mouth 3 (three) times daily.     [name] glipiZIDE (GLUCOTROL) 5 MG tablet Take 2.5 mg by mouth daily before breakfast.    . hydrALAZINE (APRESOLINE) 50 MG tablet Take 50 mg by mouth 3 (three) times daily.    . Investigational - Study Medication Take 1 tablet by mouth 2 (two) times daily. Study name: Galactic Heart Failure Study Additional study details: Omecamtiv Mecarbil or Placebo 1 each PRN  . isosorbide mononitrate (IMDUR) 60 MG 24 hr tablet Take 1 tablet (60 mg total) by mouth daily. 30 tablet 0  . loratadine (CLARITIN) 10 MG tablet Take 10 mg  by mouth daily.    . Magnesium Oxide 420 MG TABS Take 840 mg by mouth daily.    . metolazone (ZAROXOLYN) 2.5 MG tablet Take 1 tablet (2.5 mg total) by mouth once a week. Monday 12 tablet 0  . Multiple Vitamin (MULTIVITAMIN) tablet Take 1 tablet by mouth daily.    [name] omeprazole (PRILOSEC) 40 MG capsule Take 40 mg by mouth daily.    . potassium chloride (K-DUR,KLOR-CON) 10 MEQ tablet Take 4 tablets (40 mEq total) by mouth 2 (two) times daily. 240 tablet 11  . predniSONE (DELTASONE) 10 MG tablet 6 tabs po day 1, 5 tabs po day 2, 4 tabs po day 3, 3 tabs po day 4, 2 tabs po day 5, 1 tab po day 6  (Patient not taking: Reported on 05/22/2018) 21 tablet 0  . rOPINIRole (REQUIP) 0.5 MG tablet Take 0.5 mg by mouth at bedtime.     . sertraline (ZOLOFT) 100 MG tablet Take 150 mg by mouth daily.    . simvastatin (ZOCOR) 20 MG tablet Take 20 mg by mouth daily.    [name] spironolactone (ALDACTONE) 25 MG tablet Take 12.5 mg by mouth daily.    [name] torsemide (DEMADEX) 20 MG tablet Take 1 tablet (20 mg total) by mouth 2 (two) times daily. 120 tablet 3   No current facility-administered medications on file prior to visit.     Past Surgical History:  Procedure Laterality Date  . ABDOMINAL HYSTERECTOMY    . CARDIAC DEFIBRILLATOR PLACEMENT    . CARPAL TUNNEL RELEASE    . HEEL SPUR EXCISION    . KNEE SURGERY    . PACEMAKER GENERATOR CHANGE    . RIGHT HEART CATH N/A 02/13/2017   Procedure: Right Heart Cath;  Surgeon: Jolaine Artist, MD;  Location: [facility];  Service: Cardiovascular;  Laterality: N/A;  . SHOULDER SURGERY      Allergies  Allergen Reactions  . Codeine Itching  . Gabapentin Itching  . Haldol [Haloperidol Lactate] Other (See Comments)    Dizziness, hallucinations  . Levofloxacin     Other reaction(s): Other (See Comments) Projectile vomiting   . Lisinopril Itching and Swelling  . Losartan Itching  . Morphine And Related Nausea And Vomiting  . Penicillins Nausea And Vomiting    "Vomiting, upset stomach, Headache" Has patient had a PCN reaction causing immediate rash, facial/tongue/throat swelling, SOB or lightheadedness with hypotension: No Has patient had a PCN reaction causing severe rash involving mucus membranes or skin necrosis: No Has patient had a PCN reaction that required hospitalization: No Has patient had a PCN reaction occurring within the last 10 years: No If all of the above answers are "NO", then may proceed with Cephalosporin use.     Social History   Socioeconomic History  . Marital status: Single    Spouse name: Not on file  . Number of  children: Not on file  . Years of education: Not on file  . Highest education level: Not on file  Occupational History  . Not on file  Social Needs  . Financial resource strain: Not on file  . Food insecurity:    Worry: Not on file    Inability: Not on file  . Transportation needs:    Medical: Not on file    Non-medical: Not on file  Tobacco Use  . Smoking status: Never Smoker  . Smokeless tobacco: Never Used  Substance and Sexual Activity  . Alcohol use: No  . Drug use:  No  . Sexual activity: Not on file  Lifestyle  . Physical activity:    Days per week: Not on file    Minutes per session: Not on file  . Stress: Not on file  Relationships  . Social connections:    Talks on phone: Not on file    Gets together: Not on file    Attends religious service: Not on file    Active member of club or organization: Not on file    Attends meetings of clubs or organizations: Not on file    Relationship status: Not on file  . Intimate partner violence:    Fear of current or ex partner: Not on file    Emotionally abused: Not on file    Physically abused: Not on file    Forced sexual activity: Not on file  Other Topics Concern  . Not on file  Social History Narrative  . Not on file    Family History  Problem Relation Age of Onset  . Hypertension Mother     BP 121/77   Pulse 77   Ht 5\' 2"  (1.575 m)   Wt 192 lb (87.1 kg)   BMI 35.12 kg/m   Review of Systems: See HPI above.     Objective:  Physical Exam:  GEN: NAD, pt comfortable appearing  Bilateral feet:  no deformity. No erythema or swelling No TTP Full ROM of the toes/feet b/l 5/5 strength in ankle/foot NV intact  Right arm:  dried/scabbed lesions in the C8 distribution of the right arm. No vesicles or weeping observed. Full ROM Slight area of decreased sensation over the ulnar aspect of the forearm, otherwise NV intact  Assessment & Plan:  1. Bilateral foot pain secondary to gout - resolved. Pt will  follow up with her PCP for chronic gout management  2. Shingles - right C8 nerve root distribution involved. Pt has completed antiviral treatment. No active lesions. No new lesions for about the past week. 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 Pt provided with note stating she may safely return.

## 2018-06-12 NOTE — Patient Instructions (Signed)
You're doing great! Follow up with me as needed.

## 2018-06-17 ENCOUNTER — Telehealth (HOSPITAL_COMMUNITY): Payer: Self-pay

## 2018-06-17 NOTE — Telephone Encounter (Signed)
I received a text message from [name] stating that she would be helping her sister and would not be available after Wednesday for the rest of the week. I advised that I would come by tomorrow at 13:00 for a visit. She was agreeable.

## 2018-06-18 ENCOUNTER — Other Ambulatory Visit (HOSPITAL_COMMUNITY): Payer: Self-pay

## 2018-06-18 NOTE — Progress Notes (Signed)
Paramedicine Encounter    Patient ID: Michelle Andrade, female    DOB: May 15, 1961, 57 y.o.   MRN: [id number]   Patient Care Team: Henderson [place], [name] as PCP - General (Nurse Practitioner)  Patient Active Problem List   Diagnosis Date Noted  . Secondary hyperparathyroidism of renal origin ([place]) 03/20/2018  . Severe obesity (BMI 35.0-39.9) with comorbidity ([place]) 12/19/2017  . CKD (chronic kidney disease) stage 4, GFR 15-29 ml/min (HCC) 06/07/2017  . Acute on chronic systolic (congestive) heart failure ([place])   . CHF (congestive heart failure) ([place]) 02/09/2017  . Pulmonary vascular congestion 02/09/2017  . Hypertension   . Sleep apnea   . Chondrocalcinosis of both knees 02/08/2017  . Primary osteoarthritis of both knees 02/08/2017  . Type 2 diabetes mellitus ([place]) 11/16/2016  . Borderline personality disorder ([place]) 10/17/2016  . Carpal tunnel syndrome 10/17/2016  . 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 Eczema 07/03/2016  . Atrial tachycardia ([place]) 05/31/2016  . 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 Onychomycosis of toenail 06/08/2015  . Cardiomyopathy ([place]) 06/07/2015  . Irritable bowel syndrome 09/27/2014  . PAD (peripheral artery disease) ([place]) 01/12/2014  . History of DVT (deep vein thrombosis) 01/12/2014  . Gout 12/29/2013  . Iron deficiency anemia 12/29/2013  . Lichenification and lichen simplex chronicus 12/26/2013  . Hyperlipidemia associated with type 2 diabetes mellitus ([place]) 11/10/2013  . Benign hypertension with chronic kidney disease, stage IV ([place]) 11/10/2013  . Disorder of magnesium metabolism 10/13/2013  . CAD in native artery 09/01/2013  . Anxiety state 07/27/2013  . Depressive disorder 07/27/2013  . Gastro-esophageal reflux disease without esophagitis 07/27/2013  . Low back pain 06/03/2013  . Mitral valve disease 06/03/2013  . Pulmonary valve disorder 06/03/2013   . Tricuspid valve disorder 06/03/2013  . 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 Allergic rhinitis 02/07/2013  . 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 Pain in joint 02/07/2013    Current Outpatient Medications:  .  amiodarone (PACERONE) 200 MG tablet, Take 200 mg by mouth daily., Disp: , Rfl:  .  aspirin 81 MG chewable tablet, Chew 81 mg by mouth daily., Disp: , Rfl:  .  Calcium Carbonate-Vitamin D3 (CALCIUM 600/VITAMIN D) 600-400 MG-UNIT TABS, Take 1 tablet by mouth 2 (two) times daily., Disp: , Rfl:  .  carvedilol (COREG) 25 MG tablet, Take 12.5 mg by mouth 2 (two) times daily with a meal., Disp: , Rfl:  .  diclofenac sodium (VOLTAREN) 1 % GEL, Apply 2 g topically 4 (four) times daily., Disp: 100 g, Rfl: 0 .  ferrous sulfate 325 (65 FE) MG tablet, Take 325 mg by mouth 3 (three) times daily. , Disp: , Rfl:  .  glipiZIDE (GLUCOTROL) 5 MG tablet, Take 2.5 mg by mouth daily before breakfast., Disp: , Rfl:  .  hydrALAZINE (APRESOLINE) 50 MG tablet, Take 50 mg by mouth 3 (three) times daily., Disp: , Rfl:  .  Investigational - Study Medication, Take 1 tablet by mouth 2 (two) times daily. Study name: Galactic Heart Failure Study Additional study details: Omecamtiv Mecarbil or Placebo, Disp: 1 each, Rfl: PRN .  isosorbide mononitrate (IMDUR) 60 MG 24 hr tablet, Take 1 tablet (60 mg total) by mouth daily., Disp: 30 tablet, Rfl: 0 .  loratadine (CLARITIN) 10 MG tablet, Take 10 mg by mouth daily., Disp: , Rfl:  .  Magnesium Oxide 420 MG TABS, Take 840 mg by mouth daily., Disp: , Rfl:  .  metolazone (ZAROXOLYN)  2.5 MG tablet, Take 1 tablet (2.5 mg total) by mouth once a week. Monday, Disp: 12 tablet, Rfl: 0 .  Multiple Vitamin (MULTIVITAMIN) tablet, Take 1 tablet by mouth daily., Disp: , Rfl:  .  omeprazole (PRILOSEC) 40 MG capsule, Take 40 mg by mouth daily., Disp: , Rfl:  .  potassium chloride (K-DUR,KLOR-CON) 10 MEQ tablet, Take 4 tablets (40 mEq total) by mouth 2 (two) times daily., Disp: 240  tablet, Rfl: 11 .  rOPINIRole (REQUIP) 0.5 MG tablet, Take 0.5 mg by mouth at bedtime. , Disp: , Rfl:  .  sertraline (ZOLOFT) 100 MG tablet, Take 150 mg by mouth daily., Disp: , Rfl:  .  simvastatin (ZOCOR) 20 MG tablet, Take 20 mg by mouth daily., Disp: , Rfl:  .  spironolactone (ALDACTONE) 25 MG tablet, Take 12.5 mg by mouth daily., Disp: , Rfl:  .  torsemide (DEMADEX) 20 MG tablet, Take 1 tablet (20 mg total) by mouth 2 (two) times daily., Disp: 120 tablet, Rfl: 3 .  Calcium Carbonate-Vitamin D 600-400 MG-UNIT tablet, Take by mouth., Disp: , Rfl:  .  predniSONE (DELTASONE) 10 MG tablet, 6 tabs po day 1, 5 tabs po day 2, 4 tabs po day 3, 3 tabs po day 4, 2 tabs po day 5, 1 tab po day 6 (Patient not taking: Reported on 05/22/2018), Disp: 21 tablet, Rfl: 0 Allergies  Allergen Reactions  . Codeine Itching  . Gabapentin Itching  . Haldol [Haloperidol Lactate] Other (See Comments)    Dizziness, hallucinations  . Levofloxacin     Other reaction(s): Other (See Comments) Projectile vomiting   . Lisinopril Itching and Swelling  . Losartan Itching  . Morphine And Related Nausea And Vomiting  . Penicillins Nausea And Vomiting    "Vomiting, upset stomach, Headache" Has patient had a PCN reaction causing immediate rash, facial/tongue/throat swelling, SOB or lightheadedness with hypotension: No Has patient had a PCN reaction causing severe rash involving mucus membranes or skin necrosis: No Has patient had a PCN reaction that required hospitalization: No Has patient had a PCN reaction occurring within the last 10 years: No If all of the above answers are "NO", then may proceed with Cephalosporin use.       Social History   Socioeconomic History  . Marital status: Single    Spouse name: Not on file  . Number of children: Not on file  . Years of education: Not on file  . Highest education level: Not on file  Occupational History  . Not on file  Social Needs  . Financial resource strain:  Not on file  . Food insecurity:    Worry: Not on file    Inability: Not on file  . Transportation needs:    Medical: Not on file    Non-medical: Not on file  Tobacco Use  . Smoking status: Never Smoker  . Smokeless tobacco: Never Used  Substance and Sexual Activity  . Alcohol use: No  . Drug use: No  . Sexual activity: Not on file  Lifestyle  . Physical activity:    Days per week: Not on file    Minutes per session: Not on file  . Stress: Not on file  Relationships  . Social connections:    Talks on phone: Not on file    Gets together: Not on file    Attends religious service: Not on file    Active member of club or organization: Not on file    Attends meetings of clubs  or organizations: Not on file    Relationship status: Not on file  . Intimate partner violence:    Fear of current or ex partner: Not on file    Emotionally abused: Not on file    Physically abused: Not on file    Forced sexual activity: Not on file  Other Topics Concern  . Not on file  Social History Narrative  . Not on file    Physical Exam  Constitutional: She is oriented to person, place, and time.  Cardiovascular: Normal rate and regular rhythm.  Pulmonary/Chest: Effort normal and breath sounds normal.  Abdominal: Soft. Bowel sounds are normal.  Musculoskeletal: Normal range of motion. She exhibits no edema.  Neurological: She is alert and oriented to person, place, and time.  Skin: Skin is warm and dry.  Psychiatric: She has a normal mood and affect.        Future Appointments  Date Time Provider [place]  09/01/2018 10:00 AM [facility] 1 [facility] None    BP 98/60 (BP Location: Right Arm, Patient Position: Sitting, Cuff Size: Large)   Pulse 88   Resp 18   Wt 194 lb 3.2 oz (88.1 kg)   SpO2 97%   BMI 35.52 kg/m   Weight yesterday- Did not weigh Last visit weight- 193 lb  Arrived to find female patient alert and oriented ambulating without  difficulty or shortness of breath. Patient complained of left knee pain today with some swelling noted. Patient no extremity edema noted. Lung sounds were clear. Vitals obtained and are as noted. Patient's medications reviewed and pill box filled. Visit scheduled for 2 weeks out. Visit completed.     [name], EMT 06/18/18  ACTION: Home visit completed Next visit planned for 2 weeks

## 2018-06-23 ENCOUNTER — Emergency Department (HOSPITAL_BASED_OUTPATIENT_CLINIC_OR_DEPARTMENT_OTHER)
Admission: EM | Admit: 2018-06-23 | Discharge: 2018-06-23 | Disposition: A | Discharge status: Home / Self Care | Payer: Medicare Other | Attending: Emergency Medicine | Admitting: Emergency Medicine

## 2018-06-23 ENCOUNTER — Other Ambulatory Visit: Payer: Self-pay

## 2018-06-23 ENCOUNTER — Encounter (HOSPITAL_BASED_OUTPATIENT_CLINIC_OR_DEPARTMENT_OTHER): Payer: Self-pay | Admitting: *Deleted

## 2018-06-23 DIAGNOSIS — R21 Rash and other nonspecific skin eruption: Secondary | ICD-10-CM | POA: Insufficient documentation

## 2018-06-23 DIAGNOSIS — I509 Heart failure, unspecified: Secondary | ICD-10-CM | POA: Insufficient documentation

## 2018-06-23 DIAGNOSIS — Z79899 Other long term (current) drug therapy: Secondary | ICD-10-CM | POA: Insufficient documentation

## 2018-06-23 DIAGNOSIS — N183 Chronic kidney disease, stage 3 (moderate): Secondary | ICD-10-CM | POA: Insufficient documentation

## 2018-06-23 DIAGNOSIS — I130 Hypertensive heart and chronic kidney disease with heart failure and stage 1 through stage 4 chronic kidney disease, or unspecified chronic kidney disease: Secondary | ICD-10-CM | POA: Insufficient documentation

## 2018-06-23 DIAGNOSIS — Z95810 Presence of automatic (implantable) cardiac defibrillator: Secondary | ICD-10-CM | POA: Diagnosis not present

## 2018-06-23 DIAGNOSIS — Z7982 Long term (current) use of aspirin: Secondary | ICD-10-CM | POA: Insufficient documentation

## 2018-06-23 DIAGNOSIS — Z7984 Long term (current) use of oral hypoglycemic drugs: Secondary | ICD-10-CM | POA: Diagnosis not present

## 2018-06-23 DIAGNOSIS — E1122 Type 2 diabetes mellitus with diabetic chronic kidney disease: Secondary | ICD-10-CM | POA: Insufficient documentation

## 2018-06-23 NOTE — ED Notes (Signed)
Pt. Has poss. Shingles on the lower R leg that she popped the blister on the lower R leg and now has open wound.  Pt. Was diagnosed with shingles earlier at some point per pt.

## 2018-06-23 NOTE — ED Provider Notes (Signed)
[facility] EMERGENCY DEPARTMENT Provider Note   CSN: [id number] Arrival date & time: 06/23/18  1252     History   Chief Complaint Chief Complaint  Patient presents with  . Rash    HPI Michelle Andrade is a 57 y.o. female.  HPI  Patient is a 57 year old female with a history of CAD, biventricular ICD, CHF, CKD stage III, diabetes mellitus presenting for lesions to the right lower extremity.  Patient reports that yesterday, she noticed to blister lesions that were confluent in the right lateral shin.  She reports that she popped them, and clear fluid flowed out.  Patient reports she felt some pruritic "bumps" distal to these lesions, but denies any burning pain, or feeling of heat to the area.  Patient denies any surrounding erythema.  Patient denies any fevers, chills, nausea, vomiting, or other systemic symptoms.  Patient does report that couple weeks ago, she was treated for shingles in her right upper extremity.  Patient reports that she had preceding burning pain before the lesions popped up, and there were several of them.  Patient was treated with a seven-day course of acyclovir.  No recent new medications with the exception of the acyclovir for her previous shingles.  Tdap is up-to-date.  Past Medical History:  Diagnosis Date  . Arthritis 02/08/2017  . 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 CAD (coronary artery disease) 09/01/2013  . CHF (congestive heart failure) ([place]) 06/07/2015  . CKD (chronic kidney disease), stage III ([place]) 12/09/2013  . Depression 07/27/2013  . Diabetes mellitus without complication ([place]) 79/39/0300  . GERD (gastroesophageal reflux disease) 07/27/2013  . Gout 12/29/2013  . Heart valve disorder 06/03/2013  . High cholesterol 11/10/2013  . Hypertension 11/10/2013  . IBS (irritable bowel syndrome) 09/27/2014  . Myocardial infarct ([place])   . Sleep apnea 10/13/2013  . Vascular disorder 01/12/2014     Patient Active Problem List   Diagnosis Date Noted  . Secondary hyperparathyroidism of renal origin ([place]) 03/20/2018  . Severe obesity (BMI 35.0-39.9) with comorbidity ([place]) 12/19/2017  . CKD (chronic kidney disease) stage 4, GFR 15-29 ml/min (HCC) 06/07/2017  . Acute on chronic systolic (congestive) heart failure ([place])   . CHF (congestive heart failure) ([place]) 02/09/2017  . Pulmonary vascular congestion 02/09/2017  . Hypertension   . Sleep apnea   . Chondrocalcinosis of both knees 02/08/2017  . Primary osteoarthritis of both knees 02/08/2017  . Type 2 diabetes mellitus ([place]) 11/16/2016  . Borderline personality disorder ([place]) 10/17/2016  . Carpal tunnel syndrome 10/17/2016  . 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 Eczema 07/03/2016  . Atrial tachycardia ([place]) 05/31/2016  . 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 Onychomycosis of toenail 06/08/2015  . Cardiomyopathy ([place]) 06/07/2015  . Irritable bowel syndrome 09/27/2014  . PAD (peripheral artery disease) ([place]) 01/12/2014  . History of DVT (deep vein thrombosis) 01/12/2014  . Gout 12/29/2013  . Iron deficiency anemia 12/29/2013  . Lichenification and lichen simplex chronicus 12/26/2013  . Hyperlipidemia associated with type 2 diabetes mellitus ([place]) 11/10/2013  . Benign hypertension with chronic kidney disease, stage IV ([place]) 11/10/2013  . Disorder of magnesium metabolism 10/13/2013  . CAD in native artery 09/01/2013  . Anxiety state 07/27/2013  . Depressive disorder 07/27/2013  . Gastro-esophageal reflux disease without esophagitis 07/27/2013  . Low back pain 06/03/2013  . Mitral valve disease 06/03/2013  . Pulmonary valve disorder 06/03/2013  . Tricuspid valve disorder 06/03/2013  .  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 Allergic rhinitis 02/07/2013  . 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 Pain in joint 02/07/2013    Past  Surgical History:  Procedure Laterality Date  . ABDOMINAL HYSTERECTOMY    . CARDIAC DEFIBRILLATOR PLACEMENT    . CARPAL TUNNEL RELEASE    . HEEL SPUR EXCISION    . KNEE SURGERY    . PACEMAKER GENERATOR CHANGE    . RIGHT HEART CATH N/A 02/13/2017   Procedure: Right Heart Cath;  Surgeon: Jolaine Artist, MD;  Location: [facility];  Service: Cardiovascular;  Laterality: N/A;  . SHOULDER SURGERY       OB History   None      Home Medications    Prior to Admission medications   Medication Sig Start Date End Date Taking? Authorizing Provider  amiodarone (PACERONE) 200 MG tablet Take 200 mg by mouth daily.    [provider]  aspirin 81 MG chewable tablet Chew 81 mg by mouth daily.    [provider]  Calcium Carbonate-Vitamin D 600-400 MG-UNIT tablet Take by mouth. 07/14/12   [provider]  Calcium Carbonate-Vitamin D3 (CALCIUM 600/VITAMIN D) 600-400 MG-UNIT TABS Take 1 tablet by mouth 2 (two) times daily.    [provider]  carvedilol (COREG) 25 MG tablet Take 12.5 mg by mouth 2 (two) times daily with a meal.    [provider]  diclofenac sodium (VOLTAREN) 1 % GEL Apply 2 g topically 4 (four) times daily. 03/27/18   Caccavale, Sophia, PA-C  ferrous sulfate 325 (65 FE) MG tablet Take 325 mg by mouth 3 (three) times daily.     [provider]  glipiZIDE (GLUCOTROL) 5 MG tablet Take 2.5 mg by mouth daily before breakfast.    [provider]  hydrALAZINE (APRESOLINE) 50 MG tablet Take 50 mg by mouth 3 (three) times daily.    [provider]  Investigational - Study Medication Take 1 tablet by mouth 2 (two) times daily. Study name: Galactic Heart Failure Study Additional study details: Omecamtiv Mecarbil or Placebo 02/15/17   Larey Dresser, MD  isosorbide mononitrate (IMDUR) 60 MG 24 hr tablet Take 1 tablet (60 mg total) by mouth daily. 02/16/17   Mariel Aloe, MD  loratadine (CLARITIN) 10 MG tablet Take  10 mg by mouth daily.    [provider]  Magnesium Oxide 420 MG TABS Take 840 mg by mouth daily.    [provider]  metolazone (ZAROXOLYN) 2.5 MG tablet Take 1 tablet (2.5 mg total) by mouth once a week. Monday 11/21/17 06/18/18  Bensimhon, Shaune Pascal, MD  Multiple Vitamin (MULTIVITAMIN) tablet Take 1 tablet by mouth daily.    [provider]  omeprazole (PRILOSEC) 40 MG capsule Take 40 mg by mouth daily.    [provider]  potassium chloride (K-DUR,KLOR-CON) 10 MEQ tablet Take 4 tablets (40 mEq total) by mouth 2 (two) times daily. 03/12/18   Clegg, Amy D, NP  predniSONE (DELTASONE) 10 MG tablet 6 tabs po day 1, 5 tabs po day 2, 4 tabs po day 3, 3 tabs po day 4, 2 tabs po day 5, 1 tab po day 6 Patient not taking: Reported on 05/22/2018 05/12/18   Hudnall, Sharyn Lull, MD  rOPINIRole (REQUIP) 0.5 MG tablet Take 0.5 mg by mouth at bedtime.     [provider]  sertraline (ZOLOFT) 100 MG tablet Take 150 mg by mouth daily.    [provider]  simvastatin (ZOCOR)  20 MG tablet Take 20 mg by mouth daily.    [provider]  spironolactone (ALDACTONE) 25 MG tablet Take 12.5 mg by mouth daily.    [provider]  torsemide (DEMADEX) 20 MG tablet Take 1 tablet (20 mg total) by mouth 2 (two) times daily. 04/11/17   Shirley Friar, PA-C    Family History Family History  Problem Relation Age of Onset  . Hypertension Mother     Social History Social History   Tobacco Use  . Smoking status: Never Smoker  . Smokeless tobacco: Never Used  Substance Use Topics  . Alcohol use: No  . Drug use: No     Allergies   Codeine; Gabapentin; Haldol [haloperidol lactate]; Levofloxacin; Lisinopril; Losartan; Morphine and related; and Penicillins   Review of Systems Review of Systems  Constitutional: Negative for chills and fever.  Skin: Positive for color change and rash.  Neurological: Negative for weakness and numbness.      Physical Exam Updated Vital Signs BP 117/68   Pulse 67   Temp 97.6 F (36.4 C) (Oral)   Resp 18   Ht 5\' 2"  (1.575 m)   Wt 88 kg   SpO2 95%   BMI 35.48 kg/m   Physical Exam  Constitutional: She appears well-developed and well-nourished. No distress.  Sitting comfortably in bed.  HENT:  Head: Normocephalic and atraumatic.  Eyes: Conjunctivae are normal. Right eye exhibits no discharge. Left eye exhibits no discharge.  EOMs normal to gross examination.  Neck: Normal range of motion.  Cardiovascular: Normal rate and regular rhythm.  Intact, 2+ right DP pulse.  Pulmonary/Chest:  Normal respiratory effort. Patient converses comfortably. No audible wheeze or stridor.  Abdominal: She exhibits no distension.  Musculoskeletal: Normal range of motion.  Neurological: She is alert.  Cranial nerves intact to gross observation. Patient moves extremities without difficulty.  Skin: Skin is warm and dry. Rash noted. She is not diaphoretic.  Patient has a well-healed over bullae to the right lower extremity.  No surrounding erythema.  No further blisters noted.  No lesions on the sole of the foot.  Psychiatric: She has a normal mood and affect. Her behavior is normal. Judgment and thought content normal.  Nursing note and vitals reviewed.      ED Treatments / Results  Labs (all labs ordered are listed, but only abnormal results are displayed) Labs Reviewed  HSV CULTURE AND TYPING    EKG None  Radiology No results found.  Procedures Procedures (including critical care time)  Medications Ordered in ED Medications - No data to display   Initial Impression / Assessment and Plan / ED Course  I have reviewed the triage vital signs and the nursing notes.  Pertinent labs & imaging results that were available during my care of the patient were reviewed by me and considered in my medical decision making (see chart for details).     Patient is nontoxic-appearing, afebrile,  and in no acute distress.  Patient with well-healed over bullae to the right lower extreme knee.  Does not appear to look like bullous pemphigoid.  Question recurrence of herpes zoster, but patient did have recent treatment for herpes zoster of the right upper extremity.  The lesions would fit within the L4 dermatome, but they are atypical of shingles, and patient has not had burning neuropathic pain.  Patient is a diabetic, but reports good sensation in her lower extremity.  The lesion was cultured for HSV, however given the atypical nature,  and patient's lack of distress from this, I do feel that she is stable for follow-up with primary care without prescribing antivirals at this time.  Discussed risks and benefits with patient.  Patient instructed that she must follow-up with her primary care provider in order to find the results of the HSV culture.  Return precautions were given for any spreading erythema or swelling of the lower cavity.  Patient is in understanding and agrees with plan of care.  This is a supervised visit with Dr. Gareth Morgan. Evaluation, management, and discharge planning discussed with this attending physician.  Final Clinical Impressions(s) / ED Diagnoses   Final diagnoses:  Rash and nonspecific skin eruption    ED Discharge Orders    None       Tamala Julian 06/23/18 1613    Gareth Morgan, MD 06/24/18 1453

## 2018-06-23 NOTE — ED Triage Notes (Signed)
Rash on her right lower leg since yesterday. States it started as 2 blisters that she opened.

## 2018-06-23 NOTE — Discharge Instructions (Signed)
Please see the information and instructions below regarding your visit.  Your diagnoses today include:  1. Rash and nonspecific skin eruption     Tests performed today include: See side panel of your discharge paperwork for testing performed today. Vital signs are listed at the bottom of these instructions.   We swabbed the lesion for herpes simplex.  Your primary care provider will need to follow-up on this.  The results should be available in 2 to 3 days.  Medications prescribed:    Take any prescribed medications only as prescribed, and any over the counter medications only as directed on the packaging.  Home care instructions:  Please follow any educational materials contained in this packet.   Follow-up instructions: Please follow-up with your primary care provider in 2-3 days for further evaluation of your symptoms.   Return instructions:  Please return to the Emergency Department if you experience worsening symptoms.  Please return to the emergency department if you develop any spreading redness around the site, swelling, drainage that is green, yellow, or white, or fever or chills. Please return if you have any other emergent concerns.  Additional Information:   Your vital signs today were: BP 117/68    Pulse 67    Temp 97.6 F (36.4 C) (Oral)    Resp 18    Ht 5\' 2"  (1.575 m)    Wt 88 kg    SpO2 95%    BMI 35.48 kg/m  If your blood pressure (BP) was elevated on multiple readings during this visit above 130 for the top number or above 80 for the bottom number, please have this repeated by your primary care provider within one month. --------------  Thank you for allowing [place] to participate in your care today.

## 2018-06-25 LAB — HSV CULTURE AND TYPING

## 2018-07-02 ENCOUNTER — Other Ambulatory Visit (HOSPITAL_COMMUNITY): Payer: Self-pay

## 2018-07-02 ENCOUNTER — Telehealth (HOSPITAL_COMMUNITY): Payer: Self-pay

## 2018-07-02 NOTE — Telephone Encounter (Signed)
I called Michelle Andrade to see if she was available for a visit today. She advised she would be home all afternon so I could stop by. We agreed to meet at 13:00

## 2018-07-02 NOTE — Progress Notes (Signed)
Paramedicine Encounter    Patient ID: Michelle Andrade, female    DOB: Apr 09, 1961, 57 y.o.   MRN: [id number]   Patient Care Team: Henderson Baltimore, [name] as PCP - General (Nurse Practitioner)  Patient Active Problem List   Diagnosis Date Noted  . Secondary hyperparathyroidism of renal origin ([place]) 03/20/2018  . Severe obesity (BMI 35.0-39.9) with comorbidity ([place]) 12/19/2017  . CKD (chronic kidney disease) stage 4, GFR 15-29 ml/min (HCC) 06/07/2017  . Acute on chronic systolic (congestive) heart failure ([place])   . CHF (congestive heart failure) ([place]) 02/09/2017  . Pulmonary vascular congestion 02/09/2017  . Hypertension   . Sleep apnea   . Chondrocalcinosis of both knees 02/08/2017  . Primary osteoarthritis of both knees 02/08/2017  . Type 2 diabetes mellitus ([place]) 11/16/2016  . Borderline personality disorder ([place]) 10/17/2016  . Carpal tunnel syndrome 10/17/2016  . 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 Eczema 07/03/2016  . Atrial tachycardia ([place]) 05/31/2016  . 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 Onychomycosis of toenail 06/08/2015  . Cardiomyopathy ([place]) 06/07/2015  . Irritable bowel syndrome 09/27/2014  . PAD (peripheral artery disease) ([place]) 01/12/2014  . History of DVT (deep vein thrombosis) 01/12/2014  . Gout 12/29/2013  . Iron deficiency anemia 12/29/2013  . Lichenification and lichen simplex chronicus 12/26/2013  . Hyperlipidemia associated with type 2 diabetes mellitus ([place]) 11/10/2013  . Benign hypertension with chronic kidney disease, stage IV ([place]) 11/10/2013  . Disorder of magnesium metabolism 10/13/2013  . CAD in native artery 09/01/2013  . Anxiety state 07/27/2013  . Depressive disorder 07/27/2013  . Gastro-esophageal reflux disease without esophagitis 07/27/2013  . Low back pain 06/03/2013  . Mitral valve disease 06/03/2013  . Pulmonary valve disorder 06/03/2013   . Tricuspid valve disorder 06/03/2013  . 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 Allergic rhinitis 02/07/2013  . 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 Pain in joint 02/07/2013    Current Outpatient Medications:  .  amiodarone (PACERONE) 200 MG tablet, Take 200 mg by mouth daily., Disp: , Rfl:  .  aspirin 81 MG chewable tablet, Chew 81 mg by mouth daily., Disp: , Rfl:  .  Calcium Carbonate-Vitamin D3 (CALCIUM 600/VITAMIN D) 600-400 MG-UNIT TABS, Take 1 tablet by mouth 2 (two) times daily., Disp: , Rfl:  .  carvedilol (COREG) 25 MG tablet, Take 12.5 mg by mouth 2 (two) times daily with a meal., Disp: , Rfl:  .  diclofenac sodium (VOLTAREN) 1 % GEL, Apply 2 g topically 4 (four) times daily., Disp: 100 g, Rfl: 0 .  ferrous sulfate 325 (65 FE) MG tablet, Take 325 mg by mouth 3 (three) times daily. , Disp: , Rfl:  .  glipiZIDE (GLUCOTROL) 5 MG tablet, Take 2.5 mg by mouth daily before breakfast., Disp: , Rfl:  .  hydrALAZINE (APRESOLINE) 50 MG tablet, Take 50 mg by mouth 3 (three) times daily., Disp: , Rfl:  .  Investigational - Study Medication, Take 1 tablet by mouth 2 (two) times daily. Study name: Galactic Heart Failure Study Additional study details: Omecamtiv Mecarbil or Placebo, Disp: 1 each, Rfl: PRN .  isosorbide mononitrate (IMDUR) 60 MG 24 hr tablet, Take 1 tablet (60 mg total) by mouth daily., Disp: 30 tablet, Rfl: 0 .  loratadine (CLARITIN) 10 MG tablet, Take 10 mg by mouth daily., Disp: , Rfl:  .  Magnesium Oxide 420 MG TABS, Take 840 mg by mouth daily., Disp: , Rfl:  .  metolazone (ZAROXOLYN)  2.5 MG tablet, Take 1 tablet (2.5 mg total) by mouth once a week. Monday, Disp: 12 tablet, Rfl: 0 .  Multiple Vitamin (MULTIVITAMIN) tablet, Take 1 tablet by mouth daily., Disp: , Rfl:  .  omeprazole (PRILOSEC) 40 MG capsule, Take 40 mg by mouth daily., Disp: , Rfl:  .  potassium chloride (K-DUR,KLOR-CON) 10 MEQ tablet, Take 4 tablets (40 mEq total) by mouth 2 (two) times daily., Disp: 240  tablet, Rfl: 11 .  rOPINIRole (REQUIP) 0.5 MG tablet, Take 0.5 mg by mouth at bedtime. , Disp: , Rfl:  .  sertraline (ZOLOFT) 100 MG tablet, Take 150 mg by mouth daily., Disp: , Rfl:  .  simvastatin (ZOCOR) 20 MG tablet, Take 20 mg by mouth daily., Disp: , Rfl:  .  spironolactone (ALDACTONE) 25 MG tablet, Take 12.5 mg by mouth daily., Disp: , Rfl:  .  torsemide (DEMADEX) 20 MG tablet, Take 1 tablet (20 mg total) by mouth 2 (two) times daily., Disp: 120 tablet, Rfl: 3 .  Calcium Carbonate-Vitamin D 600-400 MG-UNIT tablet, Take by mouth., Disp: , Rfl:  .  predniSONE (DELTASONE) 10 MG tablet, 6 tabs po day 1, 5 tabs po day 2, 4 tabs po day 3, 3 tabs po day 4, 2 tabs po day 5, 1 tab po day 6 (Patient not taking: Reported on 05/22/2018), Disp: 21 tablet, Rfl: 0 Allergies  Allergen Reactions  . Codeine Itching  . Gabapentin Itching  . Haldol [Haloperidol Lactate] Other (See Comments)    Dizziness, hallucinations  . Levofloxacin     Other reaction(s): Other (See Comments) Projectile vomiting   . Lisinopril Itching and Swelling  . Losartan Itching  . Morphine And Related Nausea And Vomiting  . Penicillins Nausea And Vomiting    "Vomiting, upset stomach, Headache" Has patient had a PCN reaction causing immediate rash, facial/tongue/throat swelling, SOB or lightheadedness with hypotension: No Has patient had a PCN reaction causing severe rash involving mucus membranes or skin necrosis: No Has patient had a PCN reaction that required hospitalization: No Has patient had a PCN reaction occurring within the last 10 years: No If all of the above answers are "NO", then may proceed with Cephalosporin use.       Social History   Socioeconomic History  . Marital status: Single    Spouse name: Not on file  . Number of children: Not on file  . Years of education: Not on file  . Highest education level: Not on file  Occupational History  . Not on file  Social Needs  . Financial resource strain:  Not on file  . Food insecurity:    Worry: Not on file    Inability: Not on file  . Transportation needs:    Medical: Not on file    Non-medical: Not on file  Tobacco Use  . Smoking status: Never Smoker  . Smokeless tobacco: Never Used  Substance and Sexual Activity  . Alcohol use: No  . Drug use: No  . Sexual activity: Not on file  Lifestyle  . Physical activity:    Days per week: Not on file    Minutes per session: Not on file  . Stress: Not on file  Relationships  . Social connections:    Talks on phone: Not on file    Gets together: Not on file    Attends religious service: Not on file    Active member of club or organization: Not on file    Attends meetings of clubs  or organizations: Not on file    Relationship status: Not on file  . Intimate partner violence:    Fear of current or ex partner: Not on file    Emotionally abused: Not on file    Physically abused: Not on file    Forced sexual activity: Not on file  Other Topics Concern  . Not on file  Social History Narrative  . Not on file    Physical Exam  Constitutional: She is oriented to person, place, and time.  Cardiovascular: Normal rate and regular rhythm.  Pulmonary/Chest: Effort normal and breath sounds normal.  Abdominal: Soft.  Musculoskeletal: Normal range of motion. She exhibits no edema.  Neurological: She is alert and oriented to person, place, and time.  Skin: Skin is warm and dry.  Psychiatric: She has a normal mood and affect.        Future Appointments  Date Time Provider [place]  09/01/2018 10:00 AM [place] [facility] None    BP 126/72 (BP Location: Left Arm, Patient Position: Sitting, Cuff Size: Large)   Pulse 90   Resp 18   Wt 193 lb 3.2 oz (87.6 kg)   SpO2 96%   BMI 35.34 kg/m   Weight yesterday- 197 lb Last visit weight- 194.2 lb  Michelle Andrade was seen at home today and reported feeling well. She denied SOB, headache, increased  dizziness or orthopnea. She reported being compliant with her medications and her weights have been stable. She advised that he nephrologist told her that her renal function is improving which had her in good spirits. Her diabetes is still not well controled as a result of poor dietary choices. She has an open sore on the medial aspect of her right lower leg. It was cultured for HSV at the ED last week and came back negative. The area around the sore has become red but Michelle Andrade stated it is uncomfortable when she is walking. I advised she be seen at her PCP for the sore as soon as possible. She agreed and was going to call when I left to schedule and appointment. Her medications were verified and her pillbox was refilled.   [name], EMT 07/02/18  ACTION: Home visit completed Next visit planned for 1 week

## 2018-07-16 ENCOUNTER — Telehealth (HOSPITAL_COMMUNITY): Payer: Self-pay

## 2018-07-17 NOTE — Telephone Encounter (Signed)
I called Ms Heft to schedule an appointment. She stated she was out of town to stay with her brother while he has surgery but would be home next week and asked that I come then. I agreed and will reach out then.

## 2018-07-23 ENCOUNTER — Telehealth (HOSPITAL_COMMUNITY): Payer: Self-pay

## 2018-07-24 ENCOUNTER — Other Ambulatory Visit (HOSPITAL_COMMUNITY): Payer: Self-pay

## 2018-07-24 NOTE — Telephone Encounter (Signed)
I called Michelle Andrade to let her know that I would be by tomorrow afternoon. She stated that would work out well for her and we could meet at any time. I advised that I would be by to see her at 15:30 tomorrow and she was agreeable.

## 2018-07-24 NOTE — Telephone Encounter (Signed)
I called Michelle Andrade to schedule an appointment. She stated she would be home all day today and tomorrow so I could "come on whenever." I stated I would call her later this afternoon when I had a better idea of my arrival time. She was understanding and agreeable.

## 2018-07-24 NOTE — Progress Notes (Signed)
Paramedicine Encounter    Patient ID: Michelle Andrade, female    DOB: [birth year], 59 y.o.   MRN: [id number]   Patient Care Team: Henderson Baltimore, [name] as PCP - General (Nurse Practitioner)  Patient Active Problem List   Diagnosis Date Noted  . Secondary hyperparathyroidism of renal origin ([place]) 03/20/2018  . Severe obesity (BMI 35.0-39.9) with comorbidity ([place]) 12/19/2017  . CKD (chronic kidney disease) stage 4, GFR 15-29 ml/min (HCC) 06/07/2017  . Acute on chronic systolic (congestive) heart failure ([place])   . CHF (congestive heart failure) ([name]) 02/09/2017  . Pulmonary vascular congestion 02/09/2017  . Hypertension   . Sleep apnea   . Chondrocalcinosis of both knees 02/08/2017  . Primary osteoarthritis of both knees 02/08/2017  . Type 2 diabetes mellitus ([place]) 11/16/2016  . Borderline personality disorder ([place]) 10/17/2016  . Carpal tunnel syndrome 10/17/2016  . 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 Eczema 07/03/2016  . Atrial tachycardia ([place]) 05/31/2016  . 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 Onychomycosis of toenail 06/08/2015  . Cardiomyopathy ([place]) 06/07/2015  . Irritable bowel syndrome 09/27/2014  . PAD (peripheral artery disease) ([place]) 01/12/2014  . History of DVT (deep vein thrombosis) 01/12/2014  . Gout 12/29/2013  . Iron deficiency anemia 12/29/2013  . Lichenification and lichen simplex chronicus 12/26/2013  . Hyperlipidemia associated with type 2 diabetes mellitus ([place]) 11/10/2013  . Benign hypertension with chronic kidney disease, stage IV ([place]) 11/10/2013  . Disorder of magnesium metabolism 10/13/2013  . CAD in native artery 09/01/2013  . Anxiety state 07/27/2013  . Depressive disorder 07/27/2013  . Gastro-esophageal reflux disease without esophagitis 07/27/2013  . Low back pain 06/03/2013  . Mitral valve disease 06/03/2013  . Pulmonary valve disorder 06/03/2013   . Tricuspid valve disorder 06/03/2013  . 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 Allergic rhinitis 02/07/2013  . 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 Pain in joint 02/07/2013    Current Outpatient Medications:  .  amiodarone (PACERONE) 200 MG tablet, Take 200 mg by mouth daily., Disp: , Rfl:  .  aspirin 81 MG chewable tablet, Chew 81 mg by mouth daily., Disp: , Rfl:  .  Calcium Carbonate-Vitamin D3 (CALCIUM 600/VITAMIN D) 600-400 MG-UNIT TABS, Take 1 tablet by mouth 2 (two) times daily., Disp: , Rfl:  .  carvedilol (COREG) 25 MG tablet, Take 12.5 mg by mouth 2 (two) times daily with a meal., Disp: , Rfl:  .  diclofenac sodium (VOLTAREN) 1 % GEL, Apply 2 g topically 4 (four) times daily., Disp: 100 g, Rfl: 0 .  ferrous sulfate 325 (65 FE) MG tablet, Take 325 mg by mouth 3 (three) times daily. , Disp: , Rfl:  .  glipiZIDE (GLUCOTROL) 5 MG tablet, Take 2.5 mg by mouth daily before breakfast., Disp: , Rfl:  .  hydrALAZINE (APRESOLINE) 50 MG tablet, Take 50 mg by mouth 3 (three) times daily., Disp: , Rfl:  .  Investigational - Study Medication, Take 1 tablet by mouth 2 (two) times daily. Study name: Galactic Heart Failure Study Additional study details: Omecamtiv Mecarbil or Placebo, Disp: 1 each, Rfl: PRN .  isosorbide mononitrate (IMDUR) 60 MG 24 hr tablet, Take 1 tablet (60 mg total) by mouth daily., Disp: 30 tablet, Rfl: 0 .  loratadine (CLARITIN) 10 MG tablet, Take 10 mg by mouth daily., Disp: , Rfl:  .  Magnesium Oxide 420 MG TABS, Take 840 mg by mouth daily., Disp: , Rfl:  .  metolazone (ZAROXOLYN)  2.5 MG tablet, Take 1 tablet (2.5 mg total) by mouth once a week. Monday, Disp: 12 tablet, Rfl: 0 .  Multiple Vitamin (MULTIVITAMIN) tablet, Take 1 tablet by mouth daily., Disp: , Rfl:  .  omeprazole (PRILOSEC) 40 MG capsule, Take 40 mg by mouth daily., Disp: , Rfl:  .  potassium chloride (K-DUR,KLOR-CON) 10 MEQ tablet, Take 4 tablets (40 mEq total) by mouth 2 (two) times daily., Disp: 240  tablet, Rfl: 11 .  rOPINIRole (REQUIP) 0.5 MG tablet, Take 0.5 mg by mouth at bedtime. , Disp: , Rfl:  .  sertraline (ZOLOFT) 100 MG tablet, Take 150 mg by mouth daily., Disp: , Rfl:  .  simvastatin (ZOCOR) 20 MG tablet, Take 20 mg by mouth daily., Disp: , Rfl:  .  spironolactone (ALDACTONE) 25 MG tablet, Take 12.5 mg by mouth daily., Disp: , Rfl:  .  torsemide (DEMADEX) 20 MG tablet, Take 1 tablet (20 mg total) by mouth 2 (two) times daily., Disp: 120 tablet, Rfl: 3 .  Calcium Carbonate-Vitamin D 600-400 MG-UNIT tablet, Take by mouth., Disp: , Rfl:  .  predniSONE (DELTASONE) 10 MG tablet, 6 tabs po day 1, 5 tabs po day 2, 4 tabs po day 3, 3 tabs po day 4, 2 tabs po day 5, 1 tab po day 6 (Patient not taking: Reported on 05/22/2018), Disp: 21 tablet, Rfl: 0 Allergies  Allergen Reactions  . Codeine Itching  . Gabapentin Itching  . Haldol [Haloperidol Lactate] Other (See Comments)    Dizziness, hallucinations  . Levofloxacin     Other reaction(s): Other (See Comments) Projectile vomiting   . Lisinopril Itching and Swelling  . Losartan Itching  . Morphine And Related Nausea And Vomiting  . Penicillins Nausea And Vomiting    "Vomiting, upset stomach, Headache" Has patient had a PCN reaction causing immediate rash, facial/tongue/throat swelling, SOB or lightheadedness with hypotension: No Has patient had a PCN reaction causing severe rash involving mucus membranes or skin necrosis: No Has patient had a PCN reaction that required hospitalization: No Has patient had a PCN reaction occurring within the last 10 years: No If all of the above answers are "NO", then may proceed with Cephalosporin use.       Social History   Socioeconomic History  . Marital status: Single    Spouse name: Not on file  . Number of children: Not on file  . Years of education: Not on file  . Highest education level: Not on file  Occupational History  . Not on file  Social Needs  . Financial resource strain:  Not on file  . Food insecurity:    Worry: Not on file    Inability: Not on file  . Transportation needs:    Medical: Not on file    Non-medical: Not on file  Tobacco Use  . Smoking status: Never Smoker  . Smokeless tobacco: Never Used  Substance and Sexual Activity  . Alcohol use: No  . Drug use: No  . Sexual activity: Not on file  Lifestyle  . Physical activity:    Days per week: Not on file    Minutes per session: Not on file  . Stress: Not on file  Relationships  . Social connections:    Talks on phone: Not on file    Gets together: Not on file    Attends religious service: Not on file    Active member of club or organization: Not on file    Attends meetings of clubs  or organizations: Not on file    Relationship status: Not on file  . Intimate partner violence:    Fear of current or ex partner: Not on file    Emotionally abused: Not on file    Physically abused: Not on file    Forced sexual activity: Not on file  Other Topics Concern  . Not on file  Social History Narrative  . Not on file    Physical Exam  Cardiovascular: Normal rate and regular rhythm.  Pulmonary/Chest: Effort normal and breath sounds normal.  Abdominal: Soft. She exhibits no distension.  Musculoskeletal: Normal range of motion. She exhibits no edema.  Neurological: She is alert.  Skin: Skin is warm and dry.  Psychiatric: She has a normal mood and affect.        Future Appointments  Date Time Provider [place]  09/01/2018 10:00 AM [place] [facility] None    BP 108/72 (BP Location: Left Arm, Patient Position: Sitting, Cuff Size: Large)   Pulse 84   Resp 16   Wt 192 lb (87.1 kg)   SpO2 96%   BMI 35.12 kg/m   Weight yesterday-192 lb Last visit weight- 193.2 lb  Ms Steidle was seen at home today and reported feeling well. She denied chest pain, SOB, headache, dizziness or orthopnea. She reported being compliant with her medications and her weight  has been stable over the past several weeks. Her medications were verified and her pillbox was refilled.   [name], EMT 07/24/18  ACTION: Home visit completed Next visit planned for 2 weeks

## 2018-08-08 ENCOUNTER — Other Ambulatory Visit (HOSPITAL_COMMUNITY): Payer: Self-pay

## 2018-08-08 ENCOUNTER — Other Ambulatory Visit (HOSPITAL_COMMUNITY): Payer: Self-pay | Admitting: Pharmacist

## 2018-08-08 MED ORDER — HYDRALAZINE HCL 50 MG PO TABS: 50.0000 mg | ORAL_TABLET | Freq: Three times a day (TID) | ORAL | 1 refills | Status: DC

## 2018-08-08 NOTE — Progress Notes (Signed)
Paramedicine Encounter    Patient ID: Michelle Andrade, female    DOB: 07-25-1961, 57 y.o.   MRN: [id number]   Patient Care Team: Henderson Baltimore, [name] as PCP - General (Nurse Practitioner)  Patient Active Problem List   Diagnosis Date Noted  . Secondary hyperparathyroidism of renal origin ([place]) 03/20/2018  . Severe obesity (BMI 35.0-39.9) with comorbidity ([place]) 12/19/2017  . CKD (chronic kidney disease) stage 4, GFR 15-29 ml/min (HCC) 06/07/2017  . Acute on chronic systolic (congestive) heart failure ([place])   . CHF (congestive heart failure) ([place]) 02/09/2017  . Pulmonary vascular congestion 02/09/2017  . Hypertension   . Sleep apnea   . Chondrocalcinosis of both knees 02/08/2017  . Primary osteoarthritis of both knees 02/08/2017  . Type 2 diabetes mellitus ([employer]) 11/16/2016  . Borderline personality disorder ([place]) 10/17/2016  . Carpal tunnel syndrome 10/17/2016  . 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 Eczema 07/03/2016  . Atrial tachycardia ([place]) 05/31/2016  . 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 Onychomycosis of toenail 06/08/2015  . Cardiomyopathy ([place]) 06/07/2015  . Irritable bowel syndrome 09/27/2014  . PAD (peripheral artery disease) ([place]) 01/12/2014  . History of DVT (deep vein thrombosis) 01/12/2014  . Gout 12/29/2013  . Iron deficiency anemia 12/29/2013  . Lichenification and lichen simplex chronicus 12/26/2013  . Hyperlipidemia associated with type 2 diabetes mellitus ([place]) 11/10/2013  . Benign hypertension with chronic kidney disease, stage IV ([place]) 11/10/2013  . Disorder of magnesium metabolism 10/13/2013  . CAD in native artery 09/01/2013  . Anxiety state 07/27/2013  . Depressive disorder 07/27/2013  . Gastro-esophageal reflux disease without esophagitis 07/27/2013  . Low back pain 06/03/2013  . Mitral valve disease 06/03/2013  . Pulmonary valve disorder 06/03/2013   . Tricuspid valve disorder 06/03/2013  . 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 Allergic rhinitis 02/07/2013  . 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 Pain in joint 02/07/2013    Current Outpatient Medications:  .  amiodarone (PACERONE) 200 MG tablet, Take 200 mg by mouth daily., Disp: , Rfl:  .  aspirin 81 MG chewable tablet, Chew 81 mg by mouth daily., Disp: , Rfl:  .  Calcium Carbonate-Vitamin D3 (CALCIUM 600/VITAMIN D) 600-400 MG-UNIT TABS, Take 1 tablet by mouth 2 (two) times daily., Disp: , Rfl:  .  carvedilol (COREG) 25 MG tablet, Take 12.5 mg by mouth 2 (two) times daily with a meal., Disp: , Rfl:  .  diclofenac sodium (VOLTAREN) 1 % GEL, Apply 2 g topically 4 (four) times daily., Disp: 100 g, Rfl: 0 .  ferrous sulfate 325 (65 FE) MG tablet, Take 325 mg by mouth 3 (three) times daily. , Disp: , Rfl:  .  glipiZIDE (GLUCOTROL) 5 MG tablet, Take 2.5 mg by mouth daily before breakfast., Disp: , Rfl:  .  Investigational - Study Medication, Take 1 tablet by mouth 2 (two) times daily. Study name: Galactic Heart Failure Study Additional study details: Omecamtiv Mecarbil or Placebo, Disp: 1 each, Rfl: PRN .  isosorbide mononitrate (IMDUR) 60 MG 24 hr tablet, Take 1 tablet (60 mg total) by mouth daily., Disp: 30 tablet, Rfl: 0 .  loratadine (CLARITIN) 10 MG tablet, Take 10 mg by mouth daily., Disp: , Rfl:  .  Magnesium Oxide 420 MG TABS, Take 840 mg by mouth daily., Disp: , Rfl:  .  metolazone (ZAROXOLYN) 2.5 MG tablet, Take 1 tablet (2.5 mg total) by mouth once a week. Monday, Disp: 12 tablet, Rfl: 0 .  Multiple Vitamin (MULTIVITAMIN) tablet, Take 1 tablet by mouth daily., Disp: , Rfl:  .  omeprazole (PRILOSEC) 40 MG capsule, Take 40 mg by mouth daily., Disp: , Rfl:  .  potassium chloride (K-DUR,KLOR-CON) 10 MEQ tablet, Take 4 tablets (40 mEq total) by mouth 2 (two) times daily., Disp: 240 tablet, Rfl: 11 .  rOPINIRole (REQUIP) 0.5 MG tablet, Take 0.5 mg by mouth at bedtime. , Disp: , Rfl:   .  sertraline (ZOLOFT) 100 MG tablet, Take 150 mg by mouth daily., Disp: , Rfl:  .  simvastatin (ZOCOR) 20 MG tablet, Take 20 mg by mouth daily., Disp: , Rfl:  .  spironolactone (ALDACTONE) 25 MG tablet, Take 12.5 mg by mouth daily., Disp: , Rfl:  .  torsemide (DEMADEX) 20 MG tablet, Take 1 tablet (20 mg total) by mouth 2 (two) times daily., Disp: 120 tablet, Rfl: 3 .  Calcium Carbonate-Vitamin D 600-400 MG-UNIT tablet, Take by mouth., Disp: , Rfl:  .  hydrALAZINE (APRESOLINE) 50 MG tablet, Take 50 mg by mouth 3 (three) times daily., Disp: , Rfl:  .  predniSONE (DELTASONE) 10 MG tablet, 6 tabs po day 1, 5 tabs po day 2, 4 tabs po day 3, 3 tabs po day 4, 2 tabs po day 5, 1 tab po day 6 (Patient not taking: Reported on 05/22/2018), Disp: 21 tablet, Rfl: 0 Allergies  Allergen Reactions  . Codeine Itching  . Gabapentin Itching  . Haldol [Haloperidol Lactate] Other (See Comments)    Dizziness, hallucinations  . Levofloxacin     Other reaction(s): Other (See Comments) Projectile vomiting   . Lisinopril Itching and Swelling  . Losartan Itching  . Morphine And Related Nausea And Vomiting  . Penicillins Nausea And Vomiting    "Vomiting, upset stomach, Headache" Has patient had a PCN reaction causing immediate rash, facial/tongue/throat swelling, SOB or lightheadedness with hypotension: No Has patient had a PCN reaction causing severe rash involving mucus membranes or skin necrosis: No Has patient had a PCN reaction that required hospitalization: No Has patient had a PCN reaction occurring within the last 10 years: No If all of the above answers are "NO", then may proceed with Cephalosporin use.       Social History   Socioeconomic History  . Marital status: Single    Spouse name: Not on file  . Number of children: Not on file  . Years of education: Not on file  . Highest education level: Not on file  Occupational History  . Not on file  Social Needs  . Financial resource strain: Not  on file  . Food insecurity:    Worry: Not on file    Inability: Not on file  . Transportation needs:    Medical: Not on file    Non-medical: Not on file  Tobacco Use  . Smoking status: Never Smoker  . Smokeless tobacco: Never Used  Substance and Sexual Activity  . Alcohol use: No  . Drug use: No  . Sexual activity: Not on file  Lifestyle  . Physical activity:    Days per week: Not on file    Minutes per session: Not on file  . Stress: Not on file  Relationships  . Social connections:    Talks on phone: Not on file    Gets together: Not on file    Attends religious service: Not on file    Active member of club or organization: Not on file    Attends meetings of clubs or organizations:  Not on file    Relationship status: Not on file  . Intimate partner violence:    Fear of current or ex partner: Not on file    Emotionally abused: Not on file    Physically abused: Not on file    Forced sexual activity: Not on file  Other Topics Concern  . Not on file  Social History Narrative  . Not on file    Physical Exam  Constitutional: She is oriented to person, place, and time.  Cardiovascular: Normal rate and regular rhythm.  Pulmonary/Chest: Effort normal and breath sounds normal. No respiratory distress.  Musculoskeletal: Normal range of motion. She exhibits no edema.  Neurological: She is alert and oriented to person, place, and time.  Skin: Skin is warm and dry.  Psychiatric: She has a normal mood and affect.        Future Appointments  Date Time Provider [place]  09/01/2018 10:00 AM [place] 1 [facility] None    BP 140/84 (BP Location: Left Arm, Patient Position: Sitting, Cuff Size: Large)   Pulse 86   Resp 16   Wt 196 lb 3.2 oz (89 kg)   SpO2 96%   BMI 35.89 kg/m   Weight yesterday- 194 lb Last visit weight- 192 lb  Michelle Andrade was seen at home today and reported feeling well. She denied chest pain, SOB, headache,  dizziness or orthopnea. She stated she has been compliant with her medications and her weight has been stable. Her medications were verified and her pillbox was refilled. She was out of hydralazine so I contacted Michelle Andrade, PharmD, to have an Rx sent to the [facility] until the [place] can have it shipped to her. She advised she would be able to get the medicine today and could put it in her box.   Michelle Andrade, EMT 08/08/18  ACTION: Home visit completed Next visit planned for 2 weeks

## 2018-08-20 ENCOUNTER — Other Ambulatory Visit (HOSPITAL_COMMUNITY): Payer: Self-pay

## 2018-08-20 ENCOUNTER — Telehealth (HOSPITAL_COMMUNITY): Payer: Self-pay

## 2018-08-20 NOTE — Progress Notes (Signed)
Paramedicine Encounter    Patient ID: Michelle Andrade, female    DOB: Apr 23, 1961, 57 y.o.   MRN: [id number]   Patient Care Team: Henderson Baltimore, [name] as PCP - General (Nurse Practitioner) Jorge Ny, LCSW as Social Worker (Licensed Clinical Social Worker)  Patient Active Problem List   Diagnosis Date Noted  . Secondary hyperparathyroidism of renal origin ([place]) 03/20/2018  . Severe obesity (BMI 35.0-39.9) with comorbidity ([place]) 12/19/2017  . CKD (chronic kidney disease) stage 4, GFR 15-29 ml/min (HCC) 06/07/2017  . Acute on chronic systolic (congestive) heart failure ([place])   . CHF (congestive heart failure) ([place]) 02/09/2017  . Pulmonary vascular congestion 02/09/2017  . Hypertension   . Sleep apnea   . Chondrocalcinosis of both knees 02/08/2017  . Primary osteoarthritis of both knees 02/08/2017  . Type 2 diabetes mellitus ([place]) 11/16/2016  . Borderline personality disorder ([place]) 10/17/2016  . Carpal tunnel syndrome 10/17/2016  . 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 Eczema 07/03/2016  . Atrial tachycardia ([place]) 05/31/2016  . 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 Onychomycosis of toenail 06/08/2015  . Cardiomyopathy ([place]) 06/07/2015  . Irritable bowel syndrome 09/27/2014  . PAD (peripheral artery disease) ([place]) 01/12/2014  . History of DVT (deep vein thrombosis) 01/12/2014  . Gout 12/29/2013  . Iron deficiency anemia 12/29/2013  . Lichenification and lichen simplex chronicus 12/26/2013  . Hyperlipidemia associated with type 2 diabetes mellitus ([place]) 11/10/2013  . Benign hypertension with chronic kidney disease, stage IV ([place]) 11/10/2013  . Disorder of magnesium metabolism 10/13/2013  . CAD in native artery 09/01/2013  . Anxiety state 07/27/2013  . Depressive disorder 07/27/2013  . Gastro-esophageal reflux disease without esophagitis 07/27/2013  . Low back pain 06/03/2013  .  Mitral valve disease 06/03/2013  . Pulmonary valve disorder 06/03/2013  . Tricuspid valve disorder 06/03/2013  . 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 Allergic rhinitis 02/07/2013  . 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 Pain in joint 02/07/2013    Current Outpatient Medications:  .  amiodarone (PACERONE) 200 MG tablet, Take 200 mg by mouth daily., Disp: , Rfl:  .  aspirin 81 MG chewable tablet, Chew 81 mg by mouth daily., Disp: , Rfl:  .  Calcium Carbonate-Vitamin D3 (CALCIUM 600/VITAMIN D) 600-400 MG-UNIT TABS, Take 1 tablet by mouth 2 (two) times daily., Disp: , Rfl:  .  carvedilol (COREG) 25 MG tablet, Take 12.5 mg by mouth 2 (two) times daily with a meal., Disp: , Rfl:  .  ferrous sulfate 325 (65 FE) MG tablet, Take 325 mg by mouth 3 (three) times daily. , Disp: , Rfl:  .  glipiZIDE (GLUCOTROL) 5 MG tablet, Take 2.5 mg by mouth daily before breakfast., Disp: , Rfl:  .  hydrALAZINE (APRESOLINE) 50 MG tablet, Take 1 tablet (50 mg total) by mouth 3 (three) times daily., Disp: 90 tablet, Rfl: 1 .  Investigational - Study Medication, Take 1 tablet by mouth 2 (two) times daily. Study name: Galactic Heart Failure Study Additional study details: Omecamtiv Mecarbil or Placebo, Disp: 1 each, Rfl: PRN .  isosorbide mononitrate (IMDUR) 60 MG 24 hr tablet, Take 1 tablet (60 mg total) by mouth daily., Disp: 30 tablet, Rfl: 0 .  loratadine (CLARITIN) 10 MG tablet, Take 10 mg by mouth daily., Disp: , Rfl:  .  Magnesium Oxide 420 MG TABS, Take 840 mg by mouth daily., Disp: , Rfl:  .  metolazone (ZAROXOLYN) 2.5 MG tablet, Take 1 tablet (  2.5 mg total) by mouth once a week. Monday, Disp: 12 tablet, Rfl: 0 .  Multiple Vitamin (MULTIVITAMIN) tablet, Take 1 tablet by mouth daily., Disp: , Rfl:  .  omeprazole (PRILOSEC) 40 MG capsule, Take 40 mg by mouth daily., Disp: , Rfl:  .  potassium chloride (K-DUR,KLOR-CON) 10 MEQ tablet, Take 4 tablets (40 mEq total) by mouth 2 (two) times daily., Disp: 240 tablet,  Rfl: 11 .  rOPINIRole (REQUIP) 0.5 MG tablet, Take 0.5 mg by mouth at bedtime. , Disp: , Rfl:  .  sertraline (ZOLOFT) 100 MG tablet, Take 150 mg by mouth daily., Disp: , Rfl:  .  simvastatin (ZOCOR) 20 MG tablet, Take 20 mg by mouth daily., Disp: , Rfl:  .  spironolactone (ALDACTONE) 25 MG tablet, Take 12.5 mg by mouth daily., Disp: , Rfl:  .  torsemide (DEMADEX) 20 MG tablet, Take 1 tablet (20 mg total) by mouth 2 (two) times daily., Disp: 120 tablet, Rfl: 3 .  Calcium Carbonate-Vitamin D 600-400 MG-UNIT tablet, Take by mouth., Disp: , Rfl:  .  diclofenac sodium (VOLTAREN) 1 % GEL, Apply 2 g topically 4 (four) times daily., Disp: 100 g, Rfl: 0 .  predniSONE (DELTASONE) 10 MG tablet, 6 tabs po day 1, 5 tabs po day 2, 4 tabs po day 3, 3 tabs po day 4, 2 tabs po day 5, 1 tab po day 6 (Patient not taking: Reported on 05/22/2018), Disp: 21 tablet, Rfl: 0 Allergies  Allergen Reactions  . Codeine Itching  . Gabapentin Itching  . Haldol [Haloperidol Lactate] Other (See Comments)    Dizziness, hallucinations  . Levofloxacin     Other reaction(s): Other (See Comments) Projectile vomiting   . Lisinopril Itching and Swelling  . Losartan Itching  . Morphine And Related Nausea And Vomiting  . Penicillins Nausea And Vomiting    "Vomiting, upset stomach, Headache" Has patient had a PCN reaction causing immediate rash, facial/tongue/throat swelling, SOB or lightheadedness with hypotension: No Has patient had a PCN reaction causing severe rash involving mucus membranes or skin necrosis: No Has patient had a PCN reaction that required hospitalization: No Has patient had a PCN reaction occurring within the last 10 years: No If all of the above answers are "NO", then may proceed with Cephalosporin use.       Social History   Socioeconomic History  . Marital status: Single    Spouse name: Not on file  . Number of children: Not on file  . Years of education: Not on file  . Highest education level:  Not on file  Occupational History  . Not on file  Social Needs  . Financial resource strain: Not on file  . Food insecurity:    Worry: Not on file    Inability: Not on file  . Transportation needs:    Medical: Not on file    Non-medical: Not on file  Tobacco Use  . Smoking status: Never Smoker  . Smokeless tobacco: Never Used  Substance and Sexual Activity  . Alcohol use: No  . Drug use: No  . Sexual activity: Not on file  Lifestyle  . Physical activity:    Days per week: Not on file    Minutes per session: Not on file  . Stress: Not on file  Relationships  . Social connections:    Talks on phone: Not on file    Gets together: Not on file    Attends religious service: Not on file    Active  member of club or organization: Not on file    Attends meetings of clubs or organizations: Not on file    Relationship status: Not on file  . Intimate partner violence:    Fear of current or ex partner: Not on file    Emotionally abused: Not on file    Physically abused: Not on file    Forced sexual activity: Not on file  Other Topics Concern  . Not on file  Social History Narrative  . Not on file    Physical Exam Neck:     Musculoskeletal: Normal range of motion.  Cardiovascular:     Rate and Rhythm: Normal rate and regular rhythm.  Pulmonary:     Effort: Pulmonary effort is normal.     Breath sounds: Normal breath sounds.  Abdominal:     General: There is no distension.  Musculoskeletal: Normal range of motion.     Right lower leg: No edema.     Left lower leg: No edema.  Skin:    General: Skin is warm and dry.  Neurological:     Mental Status: She is alert.  Psychiatric:        Mood and Affect: Mood normal.         Future Appointments  Date Time Provider [place]  09/01/2018 10:00 AM [place] 1 [facility] None    BP 118/70 (BP Location: Left Arm, Patient Position: Sitting, Cuff Size: Normal)   Pulse 96   Resp 18   Wt  191 lb 3.2 oz (86.7 kg)   SpO2 95%   BMI 34.97 kg/m   Weight yesterday- Did not weigh Last visit weight- 196.2 lb  Michelle Andrade was seen at home today and reported feeling generally well. She denied chest pain, SOB, headache or orthopnea. She reported being compliant with her medications over the past two weeks and her weight has been trending down. Her medications were verified and her pillbox was refilled. She ran out of calcium carbonate but has enough to last her for the next week. I advised he of the missing medication in her second pillbox and she stated she would add it when it arrives.    Michelle Andrade, EMT 08/20/18  ACTION: Home visit completed Next visit planned for 2 weeks

## 2018-08-20 NOTE — Telephone Encounter (Signed)
I called Michelle Andrade to schedule a visit for today. She stated she would be home all day and would be able to meet at any time. I advised that I would be there after lunch and she was agreeable.

## 2018-09-01 VITALS — BP 123/67 | HR 71 | Resp 18 | Wt 195.4 lb

## 2018-09-01 DIAGNOSIS — Z006 Encounter for examination for normal comparison and control in clinical research program: Secondary | ICD-10-CM

## 2018-09-01 NOTE — Research (Signed)
Michelle Andrade to research clinic for visit Week 80 in the [place] study.  No complaints or serious adverse events to report. AE's reported to sponsor.  Subject 94% compliant with medications and new medication dispensed.  Next appointment scheduled.

## 2018-09-02 ENCOUNTER — Other Ambulatory Visit (HOSPITAL_COMMUNITY): Payer: Self-pay

## 2018-09-02 NOTE — Progress Notes (Signed)
Paramedicine Encounter    Patient ID: Michelle Andrade, female    DOB: 10/16/1960, 57 y.o.   MRN: [id number]   Patient Care Team: Henderson Baltimore, [name] as PCP - General (Nurse Practitioner) Jorge Ny, LCSW as Social Worker (Licensed Clinical Social Worker)  Patient Active Problem List   Diagnosis Date Noted  . Secondary hyperparathyroidism of renal origin ([place]) 03/20/2018  . Severe obesity (BMI 35.0-39.9) with comorbidity ([place]) 12/19/2017  . CKD (chronic kidney disease) stage 4, GFR 15-29 ml/min (HCC) 06/07/2017  . Acute on chronic systolic (congestive) heart failure ([place])   . CHF (congestive heart failure) ([place]) 02/09/2017  . Pulmonary vascular congestion 02/09/2017  . Hypertension   . Sleep apnea   . Chondrocalcinosis of both knees 02/08/2017  . Primary osteoarthritis of both knees 02/08/2017  . Type 2 diabetes mellitus ([place]) 11/16/2016  . Borderline personality disorder ([place]) 10/17/2016  . Carpal tunnel syndrome 10/17/2016  . 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 Eczema 07/03/2016  . Atrial tachycardia ([place]) 05/31/2016  . 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 Onychomycosis of toenail 06/08/2015  . Cardiomyopathy ([place]) 06/07/2015  . Irritable bowel syndrome 09/27/2014  . PAD (peripheral artery disease) ([place]) 01/12/2014  . History of DVT (deep vein thrombosis) 01/12/2014  . Gout 12/29/2013  . Iron deficiency anemia 12/29/2013  . Lichenification and lichen simplex chronicus 12/26/2013  . Hyperlipidemia associated with type 2 diabetes mellitus ([place]) 11/10/2013  . Benign hypertension with chronic kidney disease, stage IV ([place]) 11/10/2013  . Disorder of magnesium metabolism 10/13/2013  . CAD in native artery 09/01/2013  . Anxiety state 07/27/2013  . Depressive disorder 07/27/2013  . Gastro-esophageal reflux disease without esophagitis 07/27/2013  . Low back pain 06/03/2013  .  Mitral valve disease 06/03/2013  . Pulmonary valve disorder 06/03/2013  . Tricuspid valve disorder 06/03/2013  . 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 Allergic rhinitis 02/07/2013  . 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 Pain in joint 02/07/2013    Current Outpatient Medications:  .  amiodarone (PACERONE) 200 MG tablet, Take 200 mg by mouth daily., Disp: , Rfl:  .  aspirin 81 MG chewable tablet, Chew 81 mg by mouth daily., Disp: , Rfl:  .  Calcium Carbonate-Vitamin D3 (CALCIUM 600/VITAMIN D) 600-400 MG-UNIT TABS, Take 1 tablet by mouth 2 (two) times daily., Disp: , Rfl:  .  carvedilol (COREG) 25 MG tablet, Take 12.5 mg by mouth 2 (two) times daily with a meal., Disp: , Rfl:  .  diclofenac sodium (VOLTAREN) 1 % GEL, Apply 2 g topically 4 (four) times daily., Disp: 100 g, Rfl: 0 .  ferrous sulfate 325 (65 FE) MG tablet, Take 325 mg by mouth 3 (three) times daily. , Disp: , Rfl:  .  glipiZIDE (GLUCOTROL) 5 MG tablet, Take 2.5 mg by mouth daily before breakfast., Disp: , Rfl:  .  hydrALAZINE (APRESOLINE) 50 MG tablet, Take 1 tablet (50 mg total) by mouth 3 (three) times daily., Disp: 90 tablet, Rfl: 1 .  Investigational - Study Medication, Take 1 tablet by mouth 2 (two) times daily. Study name: Galactic Heart Failure Study Additional study details: Omecamtiv Mecarbil or Placebo, Disp: 1 each, Rfl: PRN .  isosorbide mononitrate (IMDUR) 60 MG 24 hr tablet, Take 1 tablet (60 mg total) by mouth daily., Disp: 30 tablet, Rfl: 0 .  loratadine (CLARITIN) 10 MG tablet, Take 10 mg by mouth daily., Disp: , Rfl:  .  Magnesium Oxide 420 MG  TABS, Take 840 mg by mouth daily., Disp: , Rfl:  .  metolazone (ZAROXOLYN) 2.5 MG tablet, Take 1 tablet (2.5 mg total) by mouth once a week. Monday, Disp: 12 tablet, Rfl: 0 .  Multiple Vitamin (MULTIVITAMIN) tablet, Take 1 tablet by mouth daily., Disp: , Rfl:  .  omeprazole (PRILOSEC) 40 MG capsule, Take 40 mg by mouth daily., Disp: , Rfl:  .  potassium chloride  (K-DUR,KLOR-CON) 10 MEQ tablet, Take 4 tablets (40 mEq total) by mouth 2 (two) times daily., Disp: 240 tablet, Rfl: 11 .  rOPINIRole (REQUIP) 0.5 MG tablet, Take 0.5 mg by mouth at bedtime. , Disp: , Rfl:  .  sertraline (ZOLOFT) 100 MG tablet, Take 150 mg by mouth daily., Disp: , Rfl:  .  simvastatin (ZOCOR) 20 MG tablet, Take 20 mg by mouth daily., Disp: , Rfl:  .  spironolactone (ALDACTONE) 25 MG tablet, Take 12.5 mg by mouth daily., Disp: , Rfl:  .  torsemide (DEMADEX) 20 MG tablet, Take 1 tablet (20 mg total) by mouth 2 (two) times daily., Disp: 120 tablet, Rfl: 3 .  Calcium Carbonate-Vitamin D 600-400 MG-UNIT tablet, Take by mouth., Disp: , Rfl:  .  predniSONE (DELTASONE) 10 MG tablet, 6 tabs po day 1, 5 tabs po day 2, 4 tabs po day 3, 3 tabs po day 4, 2 tabs po day 5, 1 tab po day 6 (Patient not taking: Reported on 05/22/2018), Disp: 21 tablet, Rfl: 0 Allergies  Allergen Reactions  . Codeine Itching  . Gabapentin Itching  . Haldol [Haloperidol Lactate] Other (See Comments)    Dizziness, hallucinations  . Levofloxacin     Other reaction(s): Other (See Comments) Projectile vomiting   . Lisinopril Itching and Swelling  . Losartan Itching  . Morphine And Related Nausea And Vomiting  . Penicillins Nausea And Vomiting    "Vomiting, upset stomach, Headache" Has patient had a PCN reaction causing immediate rash, facial/tongue/throat swelling, SOB or lightheadedness with hypotension: No Has patient had a PCN reaction causing severe rash involving mucus membranes or skin necrosis: No Has patient had a PCN reaction that required hospitalization: No Has patient had a PCN reaction occurring within the last 10 years: No If all of the above answers are "NO", then may proceed with Cephalosporin use.       Social History   Socioeconomic History  . Marital status: Single    Spouse name: Not on file  . Number of children: Not on file  . Years of education: Not on file  . Highest education  level: Not on file  Occupational History  . Not on file  Social Needs  . Financial resource strain: Not on file  . Food insecurity:    Worry: Not on file    Inability: Not on file  . Transportation needs:    Medical: Not on file    Non-medical: Not on file  Tobacco Use  . Smoking status: Never Smoker  . Smokeless tobacco: Never Used  Substance and Sexual Activity  . Alcohol use: No  . Drug use: No  . Sexual activity: Not on file  Lifestyle  . Physical activity:    Days per week: Not on file    Minutes per session: Not on file  . Stress: Not on file  Relationships  . Social connections:    Talks on phone: Not on file    Gets together: Not on file    Attends religious service: Not on file    Active  member of club or organization: Not on file    Attends meetings of clubs or organizations: Not on file    Relationship status: Not on file  . Intimate partner violence:    Fear of current or ex partner: Not on file    Emotionally abused: Not on file    Physically abused: Not on file    Forced sexual activity: Not on file  Other Topics Concern  . Not on file  Social History Narrative  . Not on file    Physical Exam Cardiovascular:     Rate and Rhythm: Normal rate and regular rhythm.  Pulmonary:     Effort: Pulmonary effort is normal.     Breath sounds: Normal breath sounds.  Abdominal:     General: There is no distension.  Musculoskeletal: Normal range of motion.     Right lower leg: No edema.     Left lower leg: No edema.  Skin:    General: Skin is warm and dry.  Neurological:     Mental Status: She is alert.  Psychiatric:        Mood and Affect: Mood normal.         Future Appointments  Date Time Provider [place]  11/05/2018 10:00 AM MC ECHO 1-BUZZ MC-ECHOL[facility]  11/05/2018 11:00 AM Bensimhon, Shaune Pascal, MD MC-HVSC None  12/19/2018 10:00 AM [place] 2 LBRE-CVRES None    BP 130/80 (BP Location: Left Arm, Patient Position:  Sitting, Cuff Size: Normal)   Pulse 94   Resp 16   Wt 191 lb (86.6 kg)   SpO2 98%   BMI 34.93 kg/m   Weight yesterday- 194 lb Last visit weight- 191 lb  Ms Midgley was seen at home today and reported feeling generally well. She denied episodes of SOB, headache, increased dizziness or orthopnea but did say she had an episode of sharp chest pain a week ago that only lasted a brief moment. She stated she was seated when it happened and did not have any loss of consciousness or dizziness associated with the episode. She stated she did not call the clinic of myself when it happened because it didn't "knock me out." I advised that if this happens again then should should call 911 immediately, or at the very least call me or the HF clinic. She was understanding and agreeable. Her medications were verified and her pillboxes were refilled. I will follow up in two weeks.  [name], EMT 09/02/18  ACTION: Home visit completed Next visit planned for 2 weeks

## 2018-09-16 ENCOUNTER — Telehealth (HOSPITAL_COMMUNITY): Payer: Self-pay | Admitting: *Deleted

## 2018-09-16 ENCOUNTER — Other Ambulatory Visit (HOSPITAL_COMMUNITY): Payer: Self-pay

## 2018-09-16 NOTE — Progress Notes (Signed)
Paramedicine Encounter    Patient ID: Michelle Andrade, female    DOB: [birth year], 58 y.o.   MRN: [id number]   Patient Care Team: Henderson Baltimore, [name] as PCP - General (Nurse Practitioner) Jorge Ny, LCSW as Social Worker (Licensed Clinical Social Worker)  Patient Active Problem List   Diagnosis Date Noted  . Secondary hyperparathyroidism of renal origin ([place]) 03/20/2018  . Severe obesity (BMI 35.0-39.9) with comorbidity ([place]) 12/19/2017  . CKD (chronic kidney disease) stage 4, GFR 15-29 ml/min (HCC) 06/07/2017  . Acute on chronic systolic (congestive) heart failure ([place])   . CHF (congestive heart failure) ([place]) 02/09/2017  . Pulmonary vascular congestion 02/09/2017  . Hypertension   . Sleep apnea   . Chondrocalcinosis of both knees 02/08/2017  . Primary osteoarthritis of both knees 02/08/2017  . Type 2 diabetes mellitus ([place]) 11/16/2016  . Borderline personality disorder ([place]) 10/17/2016  . Carpal tunnel syndrome 10/17/2016  . 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 Eczema 07/03/2016  . Atrial tachycardia ([place]) 05/31/2016  . 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 Onychomycosis of toenail 06/08/2015  . Cardiomyopathy ([place]) 06/07/2015  . Irritable bowel syndrome 09/27/2014  . PAD (peripheral artery disease) ([place]) 01/12/2014  . History of DVT (deep vein thrombosis) 01/12/2014  . Gout 12/29/2013  . Iron deficiency anemia 12/29/2013  . Lichenification and lichen simplex chronicus 12/26/2013  . Hyperlipidemia associated with type 2 diabetes mellitus ([place]) 11/10/2013  . Benign hypertension with chronic kidney disease, stage IV ([place]) 11/10/2013  . Disorder of magnesium metabolism 10/13/2013  . CAD in native artery 09/01/2013  . Anxiety state 07/27/2013  . Depressive disorder 07/27/2013  . Gastro-esophageal reflux disease without esophagitis 07/27/2013  . Low back pain 06/03/2013  .  Mitral valve disease 06/03/2013  . Pulmonary valve disorder 06/03/2013  . Tricuspid valve disorder 06/03/2013  . 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 Allergic rhinitis 02/07/2013  . 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 Pain in joint 02/07/2013    Current Outpatient Medications:  .  amiodarone (PACERONE) 200 MG tablet, Take 200 mg by mouth daily., Disp: , Rfl:  .  aspirin 81 MG chewable tablet, Chew 81 mg by mouth daily., Disp: , Rfl:  .  Calcium Carbonate-Vitamin D3 (CALCIUM 600/VITAMIN D) 600-400 MG-UNIT TABS, Take 1 tablet by mouth 2 (two) times daily., Disp: , Rfl:  .  carvedilol (COREG) 25 MG tablet, Take 12.5 mg by mouth 2 (two) times daily with a meal., Disp: , Rfl:  .  ferrous sulfate 325 (65 FE) MG tablet, Take 325 mg by mouth 3 (three) times daily. , Disp: , Rfl:  .  glipiZIDE (GLUCOTROL) 5 MG tablet, Take 2.5 mg by mouth daily before breakfast., Disp: , Rfl:  .  hydrALAZINE (APRESOLINE) 50 MG tablet, Take 1 tablet (50 mg total) by mouth 3 (three) times daily., Disp: 90 tablet, Rfl: 1 .  Investigational - Study Medication, Take 1 tablet by mouth 2 (two) times daily. Study name: Galactic Heart Failure Study Additional study details: Omecamtiv Mecarbil or Placebo, Disp: 1 each, Rfl: PRN .  isosorbide mononitrate (IMDUR) 60 MG 24 hr tablet, Take 1 tablet (60 mg total) by mouth daily., Disp: 30 tablet, Rfl: 0 .  loratadine (CLARITIN) 10 MG tablet, Take 10 mg by mouth daily., Disp: , Rfl:  .  Magnesium Oxide 420 MG TABS, Take 840 mg by mouth daily., Disp: , Rfl:  .  metolazone (ZAROXOLYN) 2.5 MG tablet, Take 1 tablet (  2.5 mg total) by mouth once a week. Monday, Disp: 12 tablet, Rfl: 0 .  Multiple Vitamin (MULTIVITAMIN) tablet, Take 1 tablet by mouth daily., Disp: , Rfl:  .  omeprazole (PRILOSEC) 40 MG capsule, Take 40 mg by mouth daily., Disp: , Rfl:  .  potassium chloride (K-DUR,KLOR-CON) 10 MEQ tablet, Take 4 tablets (40 mEq total) by mouth 2 (two) times daily., Disp: 240 tablet,  Rfl: 11 .  rOPINIRole (REQUIP) 0.5 MG tablet, Take 0.5 mg by mouth at bedtime. , Disp: , Rfl:  .  sertraline (ZOLOFT) 100 MG tablet, Take 150 mg by mouth daily., Disp: , Rfl:  .  simvastatin (ZOCOR) 20 MG tablet, Take 20 mg by mouth daily., Disp: , Rfl:  .  spironolactone (ALDACTONE) 25 MG tablet, Take 12.5 mg by mouth daily., Disp: , Rfl:  .  torsemide (DEMADEX) 20 MG tablet, Take 1 tablet (20 mg total) by mouth 2 (two) times daily., Disp: 120 tablet, Rfl: 3 .  Calcium Carbonate-Vitamin D 600-400 MG-UNIT tablet, Take by mouth., Disp: , Rfl:  .  diclofenac sodium (VOLTAREN) 1 % GEL, Apply 2 g topically 4 (four) times daily., Disp: 100 g, Rfl: 0 .  predniSONE (DELTASONE) 10 MG tablet, 6 tabs po day 1, 5 tabs po day 2, 4 tabs po day 3, 3 tabs po day 4, 2 tabs po day 5, 1 tab po day 6 (Patient not taking: Reported on 05/22/2018), Disp: 21 tablet, Rfl: 0 Allergies  Allergen Reactions  . Codeine Itching  . Gabapentin Itching  . Haldol [Haloperidol Lactate] Other (See Comments)    Dizziness, hallucinations  . Levofloxacin     Other reaction(s): Other (See Comments) Projectile vomiting   . Lisinopril Itching and Swelling  . Losartan Itching  . Morphine And Related Nausea And Vomiting  . Penicillins Nausea And Vomiting    "Vomiting, upset stomach, Headache" Has patient had a PCN reaction causing immediate rash, facial/tongue/throat swelling, SOB or lightheadedness with hypotension: No Has patient had a PCN reaction causing severe rash involving mucus membranes or skin necrosis: No Has patient had a PCN reaction that required hospitalization: No Has patient had a PCN reaction occurring within the last 10 years: No If all of the above answers are "NO", then may proceed with Cephalosporin use.       Social History   Socioeconomic History  . Marital status: Single    Spouse name: Not on file  . Number of children: Not on file  . Years of education: Not on file  . Highest education level:  Not on file  Occupational History  . Not on file  Social Needs  . Financial resource strain: Not on file  . Food insecurity:    Worry: Not on file    Inability: Not on file  . Transportation needs:    Medical: Not on file    Non-medical: Not on file  Tobacco Use  . Smoking status: Never Smoker  . Smokeless tobacco: Never Used  Substance and Sexual Activity  . Alcohol use: No  . Drug use: No  . Sexual activity: Not on file  Lifestyle  . Physical activity:    Days per week: Not on file    Minutes per session: Not on file  . Stress: Not on file  Relationships  . Social connections:    Talks on phone: Not on file    Gets together: Not on file    Attends religious service: Not on file    Active  member of club or organization: Not on file    Attends meetings of clubs or organizations: Not on file    Relationship status: Not on file  . Intimate partner violence:    Fear of current or ex partner: Not on file    Emotionally abused: Not on file    Physically abused: Not on file    Forced sexual activity: Not on file  Other Topics Concern  . Not on file  Social History Narrative  . Not on file    Physical Exam Cardiovascular:     Rate and Rhythm: Regular rhythm. Tachycardia present.  Pulmonary:     Effort: Tachypnea present.     Breath sounds: Normal breath sounds.  Musculoskeletal: Normal range of motion.     Right lower leg: No edema.     Left lower leg: No edema.  Skin:    General: Skin is cool and moist.     Capillary Refill: Capillary refill takes less than 2 seconds.  Neurological:     Mental Status: She is alert.  Psychiatric:        Mood and Affect: Mood normal.         Future Appointments  Date Time Provider [place]  11/05/2018 10:00 AM MC ECHO 1-BUZZ MC-ECHOLAB [facility]  11/05/2018 11:00 AM Bensimhon, Shaune Pascal, MD MC-HVSC None  12/19/2018 10:00 AM [place] 2 LBRE-CVRES None    BP 116/80 (BP Location: Left Arm, Patient  Position: Sitting, Cuff Size: Normal)   Pulse (!) 120   Resp (!) 26   Wt 187 lb (84.8 kg)   SpO2 97%   BMI 34.20 kg/m   Weight yesterday- 187.6 lb Last visit weight- 191 lb  Michelle Andrade was seen at home today and reported feeling unwell from bronchitis. She has been under treatment from her PCP this weeks and reports thinking she is on the uphill side. She stated she has been compliant with her medications and her weight is down since our last visit. While I was present she took a shower which lasted about 10 minutes then got dressed. When she came back into the living room, she was very SOB, and diaphoretic. I noted her Hr was elevated to 120 bpm and she was breathing over 20 breaths per minute. I asked if she was OK and she stated that this is how she gets anytime she "moves around to fast." I expressed concern over this and she said she is fine going to the store or walking in church because she "takes it nice and slow." I asked if she has been doing any exercise and she has not. I reached out to Landis Martins at the HF clinic who stated she would look into the cardiac rehabilitation program and get back to me. Michelle Andrade was understanding and stated she would be willing to go. Her medications were verified and her pillbox was refilled.   Michelle Andrade, EMT 09/16/18  ACTION: Home visit completed Next visit planned for 2 weeks

## 2018-09-16 NOTE — Telephone Encounter (Signed)
Paramedicine identified patient's need for exercise conditioning with observation of significant inactivity at home. Patient stated she has participated in cardiac rehab in the past and expressed interest in re-enrolling CR program. Discussed PREP program with paramedic, and patient does not appear a good candidate for the requirements of that program. Patient has long-term goals to continue exercise in the maintenance program upon supervised exercise program. Will discuss with care-team about possibly ordering cardiac rehab prior to next clinic appointment 11/05/2018.   Landis Martins, MS, ACSM-RCEP Clinical Exercise Physiologist

## 2018-09-18 ENCOUNTER — Other Ambulatory Visit (HOSPITAL_COMMUNITY): Payer: Self-pay

## 2018-09-25 ENCOUNTER — Telehealth (HOSPITAL_COMMUNITY): Payer: Self-pay

## 2018-09-25 NOTE — Telephone Encounter (Signed)
Called and spoke with pt in regards to CR, pt stated she would like to attend the [facility] location for CR. Fax referral to [facility].  Closed referral

## 2018-09-30 ENCOUNTER — Other Ambulatory Visit (HOSPITAL_COMMUNITY): Payer: Self-pay

## 2018-09-30 NOTE — Progress Notes (Signed)
Paramedicine Encounter    Patient ID: Michelle Andrade, female    DOB: 1961-09-02, 58 y.o.   MRN: [id number]   Patient Care Team: Henderson Baltimore, [name] as PCP - General (Nurse Practitioner) Jorge Ny, LCSW as Social Worker (Licensed Clinical Social Worker)  Patient Active Problem List   Diagnosis Date Noted  . Secondary hyperparathyroidism of renal origin ([place]) 03/20/2018  . Severe obesity (BMI 35.0-39.9) with comorbidity ([place]) 12/19/2017  . CKD (chronic kidney disease) stage 4, GFR 15-29 ml/min (HCC) 06/07/2017  . Acute on chronic systolic (congestive) heart failure ([place])   . CHF (congestive heart failure) ([place]) 02/09/2017  . Pulmonary vascular congestion 02/09/2017  . Hypertension   . Sleep apnea   . Chondrocalcinosis of both knees 02/08/2017  . Primary osteoarthritis of both knees 02/08/2017  . Type 2 diabetes mellitus ([place]) 11/16/2016  . Borderline personality disorder ([place]) 10/17/2016  . Carpal tunnel syndrome 10/17/2016  . 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 Eczema 07/03/2016  . Atrial tachycardia ([place]) 05/31/2016  . 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 Onychomycosis of toenail 06/08/2015  . Cardiomyopathy ([place]) 06/07/2015  . Irritable bowel syndrome 09/27/2014  . PAD (peripheral artery disease) ([place]) 01/12/2014  . History of DVT (deep vein thrombosis) 01/12/2014  . Gout 12/29/2013  . Iron deficiency anemia 12/29/2013  . Lichenification and lichen simplex chronicus 12/26/2013  . Hyperlipidemia associated with type 2 diabetes mellitus ([place]) 11/10/2013  . Benign hypertension with chronic kidney disease, stage IV ([place]) 11/10/2013  . Disorder of magnesium metabolism 10/13/2013  . CAD in native artery 09/01/2013  . Anxiety state 07/27/2013  . Depressive disorder 07/27/2013  . Gastro-esophageal reflux disease without esophagitis 07/27/2013  . Low back pain 06/03/2013  .  Mitral valve disease 06/03/2013  . Pulmonary valve disorder 06/03/2013  . Tricuspid valve disorder 06/03/2013  . 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 Allergic rhinitis 02/07/2013  . 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 Pain in joint 02/07/2013    Current Outpatient Medications:  .  amiodarone (PACERONE) 200 MG tablet, Take 200 mg by mouth daily., Disp: , Rfl:  .  aspirin 81 MG chewable tablet, Chew 81 mg by mouth daily., Disp: , Rfl:  .  Calcium Carbonate-Vitamin D3 (CALCIUM 600/VITAMIN D) 600-400 MG-UNIT TABS, Take 1 tablet by mouth 2 (two) times daily., Disp: , Rfl:  .  carvedilol (COREG) 25 MG tablet, Take 12.5 mg by mouth 2 (two) times daily with a meal., Disp: , Rfl:  .  diclofenac sodium (VOLTAREN) 1 % GEL, Apply 2 g topically 4 (four) times daily., Disp: 100 g, Rfl: 0 .  ferrous sulfate 325 (65 FE) MG tablet, Take 325 mg by mouth 3 (three) times daily. , Disp: , Rfl:  .  glipiZIDE (GLUCOTROL) 5 MG tablet, Take 2.5 mg by mouth daily before breakfast., Disp: , Rfl:  .  hydrALAZINE (APRESOLINE) 50 MG tablet, Take 1 tablet (50 mg total) by mouth 3 (three) times daily., Disp: 90 tablet, Rfl: 1 .  Investigational - Study Medication, Take 1 tablet by mouth 2 (two) times daily. Study name: Galactic Heart Failure Study Additional study details: Omecamtiv Mecarbil or Placebo, Disp: 1 each, Rfl: PRN .  isosorbide mononitrate (IMDUR) 60 MG 24 hr tablet, Take 1 tablet (60 mg total) by mouth daily., Disp: 30 tablet, Rfl: 0 .  loratadine (CLARITIN) 10 MG tablet, Take 10 mg by mouth daily., Disp: , Rfl:  .  Magnesium Oxide 420 MG  TABS, Take 840 mg by mouth daily., Disp: , Rfl:  .  metolazone (ZAROXOLYN) 2.5 MG tablet, Take 1 tablet (2.5 mg total) by mouth once a week. Monday, Disp: 12 tablet, Rfl: 0 .  Multiple Vitamin (MULTIVITAMIN) tablet, Take 1 tablet by mouth daily., Disp: , Rfl:  .  omeprazole (PRILOSEC) 40 MG capsule, Take 40 mg by mouth daily., Disp: , Rfl:  .  potassium chloride  (K-DUR,KLOR-CON) 10 MEQ tablet, Take 4 tablets (40 mEq total) by mouth 2 (two) times daily., Disp: 240 tablet, Rfl: 11 .  rOPINIRole (REQUIP) 0.5 MG tablet, Take 0.5 mg by mouth at bedtime. , Disp: , Rfl:  .  sertraline (ZOLOFT) 100 MG tablet, Take 150 mg by mouth daily., Disp: , Rfl:  .  simvastatin (ZOCOR) 20 MG tablet, Take 20 mg by mouth daily., Disp: , Rfl:  .  spironolactone (ALDACTONE) 25 MG tablet, Take 12.5 mg by mouth daily., Disp: , Rfl:  .  torsemide (DEMADEX) 20 MG tablet, Take 1 tablet (20 mg total) by mouth 2 (two) times daily., Disp: 120 tablet, Rfl: 3 .  Calcium Carbonate-Vitamin D 600-400 MG-UNIT tablet, Take by mouth., Disp: , Rfl:  .  predniSONE (DELTASONE) 10 MG tablet, 6 tabs po day 1, 5 tabs po day 2, 4 tabs po day 3, 3 tabs po day 4, 2 tabs po day 5, 1 tab po day 6 (Patient not taking: Reported on 05/22/2018), Disp: 21 tablet, Rfl: 0 Allergies  Allergen Reactions  . Codeine Itching  . Gabapentin Itching  . Haldol [Haloperidol Lactate] Other (See Comments)    Dizziness, hallucinations  . Levofloxacin     Other reaction(s): Other (See Comments) Projectile vomiting   . Lisinopril Itching and Swelling  . Losartan Itching  . Morphine And Related Nausea And Vomiting  . Penicillins Nausea And Vomiting    "Vomiting, upset stomach, Headache" Has patient had a PCN reaction causing immediate rash, facial/tongue/throat swelling, SOB or lightheadedness with hypotension: No Has patient had a PCN reaction causing severe rash involving mucus membranes or skin necrosis: No Has patient had a PCN reaction that required hospitalization: No Has patient had a PCN reaction occurring within the last 10 years: No If all of the above answers are "NO", then may proceed with Cephalosporin use.       Social History   Socioeconomic History  . Marital status: Single    Spouse name: Not on file  . Number of children: Not on file  . Years of education: Not on file  . Highest education  level: Not on file  Occupational History  . Not on file  Social Needs  . Financial resource strain: Not on file  . Food insecurity:    Worry: Not on file    Inability: Not on file  . Transportation needs:    Medical: Not on file    Non-medical: Not on file  Tobacco Use  . Smoking status: Never Smoker  . Smokeless tobacco: Never Used  Substance and Sexual Activity  . Alcohol use: No  . Drug use: No  . Sexual activity: Not on file  Lifestyle  . Physical activity:    Days per week: Not on file    Minutes per session: Not on file  . Stress: Not on file  Relationships  . Social connections:    Talks on phone: Not on file    Gets together: Not on file    Attends religious service: Not on file    Active  member of club or organization: Not on file    Attends meetings of clubs or organizations: Not on file    Relationship status: Not on file  . Intimate partner violence:    Fear of current or ex partner: Not on file    Emotionally abused: Not on file    Physically abused: Not on file    Forced sexual activity: Not on file  Other Topics Concern  . Not on file  Social History Narrative  . Not on file    Physical Exam Cardiovascular:     Rate and Rhythm: Normal rate and regular rhythm.     Pulses: Normal pulses.  Pulmonary:     Effort: Pulmonary effort is normal.     Breath sounds: Normal breath sounds.  Abdominal:     General: There is no distension.     Palpations: Abdomen is soft.  Musculoskeletal: Normal range of motion.     Right lower leg: No edema.     Left lower leg: No edema.  Skin:    General: Skin is warm and dry.     Capillary Refill: Capillary refill takes less than 2 seconds.  Neurological:     Mental Status: She is alert and oriented to person, place, and time.  Psychiatric:        Mood and Affect: Mood normal.         Future Appointments  Date Time Provider [place]  11/05/2018 10:00 AM [facility] ECHO 1-BUZZ [facility]  11/05/2018 11:00 AM  Bensimhon, Shaune Pascal, MD MC-[facility]SC None  12/19/2018 10:00 AM [place] 2 LB[facility]-CVRES None    BP 114/66 (BP Location: Right Arm, Patient Position: Sitting, Cuff Size: Large)   Resp 20   Wt 190 lb 12.8 oz (86.5 kg)   SpO2 95%   BMI 34.90 kg/m   Weight yesterday- 191 lb Last visit weight- 187 lb  Michelle Andrade was seen at home today and reported feeling unwell as a result of an ongoing respiratory infection. Her PCP has been treating her for this over the past month and is referring her to a pulmonologist due to its persistance. She stated she has been compliant with her medications and denied episodes of chest pain, headache or increased orthopnea. She continues to have exertional dyspnea but will be starting cardiac rehab next Tuesday. Her medications were verified and her pillbox was refilled.    [name], EMT 09/30/18  ACTION: Home visit completed Next visit planned for 2 weeks

## 2018-10-14 ENCOUNTER — Telehealth (HOSPITAL_COMMUNITY): Payer: Self-pay

## 2018-10-14 NOTE — Telephone Encounter (Signed)
I received a text message from Ms Derocher this morning asking when I was planning to come this week. I advised I was planning to come tomorrow and she was agreeable.

## 2018-10-15 ENCOUNTER — Other Ambulatory Visit (HOSPITAL_COMMUNITY): Payer: Self-pay

## 2018-10-15 NOTE — Progress Notes (Signed)
Paramedicine Encounter    Patient ID: Michelle Andrade, female    DOB: 1961-08-12, 58 y.o.   MRN: [id number]   Patient Care Team: Henderson Baltimore, [name] as PCP - General (Nurse Practitioner) Jorge Ny, LCSW as Social Worker (Licensed Clinical Social Worker)  Patient Active Problem List   Diagnosis Date Noted  . Secondary hyperparathyroidism of renal origin ([place]) 03/20/2018  . Severe obesity (BMI 35.0-39.9) with comorbidity ([place]) 12/19/2017  . CKD (chronic kidney disease) stage 4, GFR 15-29 ml/min (HCC) 06/07/2017  . Acute on chronic systolic (congestive) heart failure ([place])   . CHF (congestive heart failure) ([place]) 02/09/2017  . Pulmonary vascular congestion 02/09/2017  . Hypertension   . Sleep apnea   . Chondrocalcinosis of both knees 02/08/2017  . Primary osteoarthritis of both knees 02/08/2017  . Type 2 diabetes mellitus ([place]) 11/16/2016  . Borderline personality disorder ([place]) 10/17/2016  . Carpal tunnel syndrome 10/17/2016  . 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 Eczema 07/03/2016  . Atrial tachycardia ([place]) 05/31/2016  . 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 Onychomycosis of toenail 06/08/2015  . Cardiomyopathy ([place]) 06/07/2015  . Irritable bowel syndrome 09/27/2014  . PAD (peripheral artery disease) ([place]) 01/12/2014  . History of DVT (deep vein thrombosis) 01/12/2014  . Gout 12/29/2013  . Iron deficiency anemia 12/29/2013  . Lichenification and lichen simplex chronicus 12/26/2013  . Hyperlipidemia associated with type 2 diabetes mellitus ([place]) 11/10/2013  . Benign hypertension with chronic kidney disease, stage IV ([place]) 11/10/2013  . Disorder of magnesium metabolism 10/13/2013  . CAD in native artery 09/01/2013  . Anxiety state 07/27/2013  . Depressive disorder 07/27/2013  . Gastro-esophageal reflux disease without esophagitis 07/27/2013  . Low back pain 06/03/2013  .  Mitral valve disease 06/03/2013  . Pulmonary valve disorder 06/03/2013  . Tricuspid valve disorder 06/03/2013  . 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 Allergic rhinitis 02/07/2013  . 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 Pain in joint 02/07/2013    Current Outpatient Medications:  .  amiodarone (PACERONE) 200 MG tablet, Take 200 mg by mouth daily., Disp: , Rfl:  .  aspirin 81 MG chewable tablet, Chew 81 mg by mouth daily., Disp: , Rfl:  .  Calcium Carbonate-Vitamin D3 (CALCIUM 600/VITAMIN D) 600-400 MG-UNIT TABS, Take 1 tablet by mouth 2 (two) times daily., Disp: , Rfl:  .  carvedilol (COREG) 25 MG tablet, Take 12.5 mg by mouth 2 (two) times daily with a meal., Disp: , Rfl:  .  ferrous sulfate 325 (65 FE) MG tablet, Take 325 mg by mouth 3 (three) times daily. , Disp: , Rfl:  .  glipiZIDE (GLUCOTROL) 5 MG tablet, Take 2.5 mg by mouth daily before breakfast., Disp: , Rfl:  .  hydrALAZINE (APRESOLINE) 50 MG tablet, Take 1 tablet (50 mg total) by mouth 3 (three) times daily., Disp: 90 tablet, Rfl: 1 .  Investigational - Study Medication, Take 1 tablet by mouth 2 (two) times daily. Study name: Galactic Heart Failure Study Additional study details: Omecamtiv Mecarbil or Placebo, Disp: 1 each, Rfl: PRN .  isosorbide mononitrate (IMDUR) 60 MG 24 hr tablet, Take 1 tablet (60 mg total) by mouth daily., Disp: 30 tablet, Rfl: 0 .  loratadine (CLARITIN) 10 MG tablet, Take 10 mg by mouth daily., Disp: , Rfl:  .  Magnesium Oxide 420 MG TABS, Take 840 mg by mouth daily., Disp: , Rfl:  .  metolazone (ZAROXOLYN) 2.5 MG tablet, Take 1 tablet (  2.5 mg total) by mouth once a week. Monday, Disp: 12 tablet, Rfl: 0 .  Multiple Vitamin (MULTIVITAMIN) tablet, Take 1 tablet by mouth daily., Disp: , Rfl:  .  omeprazole (PRILOSEC) 40 MG capsule, Take 40 mg by mouth daily., Disp: , Rfl:  .  potassium chloride (K-DUR,KLOR-CON) 10 MEQ tablet, Take 4 tablets (40 mEq total) by mouth 2 (two) times daily., Disp: 240 tablet,  Rfl: 11 .  rOPINIRole (REQUIP) 0.5 MG tablet, Take 0.5 mg by mouth at bedtime. , Disp: , Rfl:  .  sertraline (ZOLOFT) 100 MG tablet, Take 150 mg by mouth daily., Disp: , Rfl:  .  simvastatin (ZOCOR) 20 MG tablet, Take 20 mg by mouth daily., Disp: , Rfl:  .  spironolactone (ALDACTONE) 25 MG tablet, Take 12.5 mg by mouth daily., Disp: , Rfl:  .  torsemide (DEMADEX) 20 MG tablet, Take 1 tablet (20 mg total) by mouth 2 (two) times daily., Disp: 120 tablet, Rfl: 3 .  Calcium Carbonate-Vitamin D 600-400 MG-UNIT tablet, Take by mouth., Disp: , Rfl:  .  diclofenac sodium (VOLTAREN) 1 % GEL, Apply 2 g topically 4 (four) times daily., Disp: 100 g, Rfl: 0 .  predniSONE (DELTASONE) 10 MG tablet, 6 tabs po day 1, 5 tabs po day 2, 4 tabs po day 3, 3 tabs po day 4, 2 tabs po day 5, 1 tab po day 6 (Patient not taking: Reported on 05/22/2018), Disp: 21 tablet, Rfl: 0 Allergies  Allergen Reactions  . Codeine Itching  . Gabapentin Itching  . Haldol [Haloperidol Lactate] Other (See Comments)    Dizziness, hallucinations  . Levofloxacin     Other reaction(s): Other (See Comments) Projectile vomiting   . Lisinopril Itching and Swelling  . Losartan Itching  . Morphine And Related Nausea And Vomiting  . Penicillins Nausea And Vomiting    "Vomiting, upset stomach, Headache" Has patient had a PCN reaction causing immediate rash, facial/tongue/throat swelling, SOB or lightheadedness with hypotension: No Has patient had a PCN reaction causing severe rash involving mucus membranes or skin necrosis: No Has patient had a PCN reaction that required hospitalization: No Has patient had a PCN reaction occurring within the last 10 years: No If all of the above answers are "NO", then may proceed with Cephalosporin use.       Social History   Socioeconomic History  . Marital status: Single    Spouse name: Not on file  . Number of children: Not on file  . Years of education: Not on file  . Highest education level:  Not on file  Occupational History  . Not on file  Social Needs  . Financial resource strain: Not on file  . Food insecurity:    Worry: Not on file    Inability: Not on file  . Transportation needs:    Medical: Not on file    Non-medical: Not on file  Tobacco Use  . Smoking status: Never Smoker  . Smokeless tobacco: Never Used  Substance and Sexual Activity  . Alcohol use: No  . Drug use: No  . Sexual activity: Not on file  Lifestyle  . Physical activity:    Days per week: Not on file    Minutes per session: Not on file  . Stress: Not on file  Relationships  . Social connections:    Talks on phone: Not on file    Gets together: Not on file    Attends religious service: Not on file    Active  member of club or organization: Not on file    Attends meetings of clubs or organizations: Not on file    Relationship status: Not on file  . Intimate partner violence:    Fear of current or ex partner: Not on file    Emotionally abused: Not on file    Physically abused: Not on file    Forced sexual activity: Not on file  Other Topics Concern  . Not on file  Social History Narrative  . Not on file    Physical Exam Cardiovascular:     Rate and Rhythm: Normal rate and regular rhythm.     Pulses: Normal pulses.  Pulmonary:     Effort: Pulmonary effort is normal.     Breath sounds: Normal breath sounds.  Musculoskeletal:     Right lower leg: No edema.     Left lower leg: No edema.  Skin:    General: Skin is warm and dry.     Capillary Refill: Capillary refill takes less than 2 seconds.  Neurological:     Mental Status: She is alert and oriented to person, place, and time.  Psychiatric:        Mood and Affect: Mood normal.         Future Appointments  Date Time Provider [place]  11/05/2018 10:00 AM MC ECHO 1-BUZZ [facility]  11/05/2018 11:00 AM Bensimhon, Shaune Pascal, MD MC-[facility]SC None  12/19/2018 10:00 AM [place] 2 LBRE-CVRES None     BP 114/66 (BP Location: Left Arm, Patient Position: Sitting, Cuff Size: Large)   Pulse 80   Resp 16   Wt 189 lb (85.7 kg)   SpO2 95%   BMI 34.57 kg/m   Weight yesterday- Did not weigh Last visit weight- 190 lb  Ms Guiffre was seen at home today and reported feeling well. She has recovered from her respiratory infection since our last visit. She reported being compliant with her medications and he weight has been stable. Her medications were verified and her pillbox was refilled. She ran out of amiodarone but her pillbox was marked so she could add if when she gets it from the pharmacy.   [name], EMT 10/15/18  ACTION: Home visit completed Next visit planned for 1 week

## 2018-10-29 ENCOUNTER — Other Ambulatory Visit (HOSPITAL_COMMUNITY): Payer: Self-pay

## 2018-10-29 NOTE — Progress Notes (Signed)
Paramedicine Encounter    Patient ID: Michelle Andrade, female    DOB: 03-16-1961, 58 y.o.   MRN: [id number]   Patient Care Team: Henderson Baltimore, [name] as PCP - General (Nurse Practitioner) Jorge Ny, LCSW as Social Worker (Licensed Clinical Social Worker)  Patient Active Problem List   Diagnosis Date Noted  . Secondary hyperparathyroidism of renal origin ([place]) 03/20/2018  . Severe obesity (BMI 35.0-39.9) with comorbidity ([place]) 12/19/2017  . CKD (chronic kidney disease) stage 4, GFR 15-29 ml/min (HCC) 06/07/2017  . Acute on chronic systolic (congestive) heart failure ([place])   . CHF (congestive heart failure) ([place]) 02/09/2017  . Pulmonary vascular congestion 02/09/2017  . Hypertension   . Sleep apnea   . Chondrocalcinosis of both knees 02/08/2017  . Primary osteoarthritis of both knees 02/08/2017  . Type 2 diabetes mellitus ([place]) 11/16/2016  . Borderline personality disorder ([place]) 10/17/2016  . Carpal tunnel syndrome 10/17/2016  . 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 Eczema 07/03/2016  . Atrial tachycardia ([place]) 05/31/2016  . 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 Onychomycosis of toenail 06/08/2015  . Cardiomyopathy ([place]) 06/07/2015  . Irritable bowel syndrome 09/27/2014  . PAD (peripheral artery disease) ([place]) 01/12/2014  . History of DVT (deep vein thrombosis) 01/12/2014  . Gout 12/29/2013  . Iron deficiency anemia 12/29/2013  . Lichenification and lichen simplex chronicus 12/26/2013  . Hyperlipidemia associated with type 2 diabetes mellitus ([place]) 11/10/2013  . Benign hypertension with chronic kidney disease, stage IV ([place]) 11/10/2013  . Disorder of magnesium metabolism 10/13/2013  . CAD in native artery 09/01/2013  . Anxiety state 07/27/2013  . Depressive disorder 07/27/2013  . Gastro-esophageal reflux disease without esophagitis 07/27/2013  . Low back pain 06/03/2013  .  Mitral valve disease 06/03/2013  . Pulmonary valve disorder 06/03/2013  . Tricuspid valve disorder 06/03/2013  . 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 Allergic rhinitis 02/07/2013  . 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 Pain in joint 02/07/2013    Current Outpatient Medications:  .  amiodarone (PACERONE) 200 MG tablet, Take 200 mg by mouth daily., Disp: , Rfl:  .  aspirin 81 MG chewable tablet, Chew 81 mg by mouth daily., Disp: , Rfl:  .  Calcium Carbonate-Vitamin D3 (CALCIUM 600/VITAMIN D) 600-400 MG-UNIT TABS, Take 1 tablet by mouth 2 (two) times daily. , Disp: , Rfl:  .  carvedilol (COREG) 25 MG tablet, Take 12.5 mg by mouth 2 (two) times daily with a meal., Disp: , Rfl:  .  diclofenac sodium (VOLTAREN) 1 % GEL, Apply 2 g topically 4 (four) times daily., Disp: 100 g, Rfl: 0 .  ferrous sulfate 325 (65 FE) MG tablet, Take 325 mg by mouth 3 (three) times daily. , Disp: , Rfl:  .  glipiZIDE (GLUCOTROL) 5 MG tablet, Take 2.5 mg by mouth daily before breakfast., Disp: , Rfl:  .  hydrALAZINE (APRESOLINE) 50 MG tablet, Take 1 tablet (50 mg total) by mouth 3 (three) times daily., Disp: 90 tablet, Rfl: 1 .  Investigational - Study Medication, Take 1 tablet by mouth 2 (two) times daily. Study name: Galactic Heart Failure Study Additional study details: Omecamtiv Mecarbil or Placebo, Disp: 1 each, Rfl: PRN .  isosorbide mononitrate (IMDUR) 60 MG 24 hr tablet, Take 1 tablet (60 mg total) by mouth daily., Disp: 30 tablet, Rfl: 0 .  loratadine (CLARITIN) 10 MG tablet, Take 10 mg by mouth daily., Disp: , Rfl:  .  Magnesium Oxide 420  MG TABS, Take 840 mg by mouth daily., Disp: , Rfl:  .  metolazone (ZAROXOLYN) 2.5 MG tablet, Take 1 tablet (2.5 mg total) by mouth once a week. Monday, Disp: 12 tablet, Rfl: 0 .  Multiple Vitamin (MULTIVITAMIN) tablet, Take 1 tablet by mouth daily., Disp: , Rfl:  .  omeprazole (PRILOSEC) 40 MG capsule, Take 40 mg by mouth daily., Disp: , Rfl:  .  potassium chloride  (K-DUR,KLOR-CON) 10 MEQ tablet, Take 4 tablets (40 mEq total) by mouth 2 (two) times daily., Disp: 240 tablet, Rfl: 11 .  rOPINIRole (REQUIP) 0.5 MG tablet, Take 0.5 mg by mouth at bedtime. , Disp: , Rfl:  .  sertraline (ZOLOFT) 100 MG tablet, Take 150 mg by mouth daily., Disp: , Rfl:  .  simvastatin (ZOCOR) 20 MG tablet, Take 20 mg by mouth daily., Disp: , Rfl:  .  spironolactone (ALDACTONE) 25 MG tablet, Take 12.5 mg by mouth daily., Disp: , Rfl:  .  torsemide (DEMADEX) 20 MG tablet, Take 1 tablet (20 mg total) by mouth 2 (two) times daily., Disp: 120 tablet, Rfl: 3 .  Calcium Carbonate-Vitamin D 600-400 MG-UNIT tablet, Take by mouth., Disp: , Rfl:  .  predniSONE (DELTASONE) 10 MG tablet, 6 tabs po day 1, 5 tabs po day 2, 4 tabs po day 3, 3 tabs po day 4, 2 tabs po day 5, 1 tab po day 6 (Patient not taking: Reported on 05/22/2018), Disp: 21 tablet, Rfl: 0 Allergies  Allergen Reactions  . Codeine Itching  . Gabapentin Itching  . Haldol [Haloperidol Lactate] Other (See Comments)    Dizziness, hallucinations  . Levofloxacin     Other reaction(s): Other (See Comments) Projectile vomiting   . Lisinopril Itching and Swelling  . Losartan Itching  . Morphine And Related Nausea And Vomiting  . Penicillins Nausea And Vomiting    "Vomiting, upset stomach, Headache" Has patient had a PCN reaction causing immediate rash, facial/tongue/throat swelling, SOB or lightheadedness with hypotension: No Has patient had a PCN reaction causing severe rash involving mucus membranes or skin necrosis: No Has patient had a PCN reaction that required hospitalization: No Has patient had a PCN reaction occurring within the last 10 years: No If all of the above answers are "NO", then may proceed with Cephalosporin use.       Social History   Socioeconomic History  . Marital status: Single    Spouse name: Not on file  . Number of children: Not on file  . Years of education: Not on file  . Highest education  level: Not on file  Occupational History  . Not on file  Social Needs  . Financial resource strain: Not on file  . Food insecurity:    Worry: Not on file    Inability: Not on file  . Transportation needs:    Medical: Not on file    Non-medical: Not on file  Tobacco Use  . Smoking status: Never Smoker  . Smokeless tobacco: Never Used  Substance and Sexual Activity  . Alcohol use: No  . Drug use: No  . Sexual activity: Not on file  Lifestyle  . Physical activity:    Days per week: Not on file    Minutes per session: Not on file  . Stress: Not on file  Relationships  . Social connections:    Talks on phone: Not on file    Gets together: Not on file    Attends religious service: Not on file  Active member of club or organization: Not on file    Attends meetings of clubs or organizations: Not on file    Relationship status: Not on file  . Intimate partner violence:    Fear of current or ex partner: Not on file    Emotionally abused: Not on file    Physically abused: Not on file    Forced sexual activity: Not on file  Other Topics Concern  . Not on file  Social History Narrative  . Not on file    Physical Exam Cardiovascular:     Rate and Rhythm: Normal rate and regular rhythm.     Pulses: Normal pulses.  Pulmonary:     Effort: Pulmonary effort is normal.     Breath sounds: Normal breath sounds.  Abdominal:     General: There is no distension.  Musculoskeletal: Normal range of motion.     Right lower leg: No edema.     Left lower leg: No edema.  Skin:    General: Skin is warm and dry.     Capillary Refill: Capillary refill takes less than 2 seconds.  Neurological:     Mental Status: She is alert and oriented to person, place, and time.  Psychiatric:        Mood and Affect: Mood normal.         Future Appointments  Date Time Provider [employer]  11/05/2018 10:00 AM [facility] ECHO 1-BUZZ [facility]ECHOL[facility]  11/05/2018 11:00 AM Bensimhon, Shaune Pascal, MD MC-HVSC  None  11/10/2018  2:45 PM Dene Gentry, MD [facility]  12/19/2018 10:00 AM [place] 2 LBRE-CVRES None    BP 120/68 (BP Location: Left Arm, Patient Position: Sitting, Cuff Size: Normal)   Pulse 86   Resp 16   Wt 190 lb (86.2 kg)   SpO2 97%   BMI 34.75 kg/m   Weight yesterday- 189 lb Last visit weight- 189 lb  Ms Kudo was seen at home today and reported feeling generally well. She denied chest pain, SOB, headache, dizziness or orthopnea. She did say she gets tired with less physical activity however said she has been trying to exercise when the weather allows. She has been compliant with her medications over the past two weeks and her weight has been stable. Her medications were verified and her pillbox was refilled.   [name], EMT 10/29/18  ACTION: Home visit completed Next visit planned for 2 weeks

## 2018-11-04 ENCOUNTER — Telehealth (HOSPITAL_COMMUNITY): Payer: Self-pay

## 2018-11-04 NOTE — Telephone Encounter (Signed)
I called Michelle Andrade to remind her of an appointment tomorrow at the HF clinic and an echo. She did not answer so I left a message requesting she bring her medications and pillbox with her so we could address any changes while she is in the clinic.

## 2018-11-05 ENCOUNTER — Ambulatory Visit (HOSPITAL_COMMUNITY)
Admission: RE | Admit: 2018-11-05 | Discharge: 2018-11-05 | Disposition: A | Discharge status: Home / Self Care | Payer: Medicare Other | Source: Ambulatory Visit | Attending: Adult Health | Admitting: Adult Health

## 2018-11-05 ENCOUNTER — Other Ambulatory Visit (HOSPITAL_COMMUNITY): Payer: Self-pay

## 2018-11-05 ENCOUNTER — Ambulatory Visit (HOSPITAL_BASED_OUTPATIENT_CLINIC_OR_DEPARTMENT_OTHER)
Admission: RE | Admit: 2018-11-05 | Discharge: 2018-11-05 | Disposition: A | Discharge status: Home / Self Care | Payer: Medicare Other | Source: Ambulatory Visit | Attending: Internal Medicine | Admitting: Internal Medicine

## 2018-11-05 VITALS — BP 128/80 | HR 64 | Wt 192.8 lb

## 2018-11-05 DIAGNOSIS — Z7982 Long term (current) use of aspirin: Secondary | ICD-10-CM | POA: Diagnosis not present

## 2018-11-05 DIAGNOSIS — I471 Supraventricular tachycardia: Secondary | ICD-10-CM | POA: Insufficient documentation

## 2018-11-05 DIAGNOSIS — M1990 Unspecified osteoarthritis, unspecified site: Secondary | ICD-10-CM | POA: Insufficient documentation

## 2018-11-05 DIAGNOSIS — Z888 Allergy status to other drugs, medicaments and biological substances status: Secondary | ICD-10-CM | POA: Insufficient documentation

## 2018-11-05 DIAGNOSIS — Z8249 Family history of ischemic heart disease and other diseases of the circulatory system: Secondary | ICD-10-CM | POA: Diagnosis not present

## 2018-11-05 DIAGNOSIS — I428 Other cardiomyopathies: Secondary | ICD-10-CM | POA: Insufficient documentation

## 2018-11-05 DIAGNOSIS — I130 Hypertensive heart and chronic kidney disease with heart failure and stage 1 through stage 4 chronic kidney disease, or unspecified chronic kidney disease: Secondary | ICD-10-CM | POA: Diagnosis present

## 2018-11-05 DIAGNOSIS — N183 Chronic kidney disease, stage 3 unspecified: Secondary | ICD-10-CM

## 2018-11-05 DIAGNOSIS — Z791 Long term (current) use of non-steroidal anti-inflammatories (NSAID): Secondary | ICD-10-CM | POA: Insufficient documentation

## 2018-11-05 DIAGNOSIS — Z881 Allergy status to other antibiotic agents status: Secondary | ICD-10-CM | POA: Diagnosis not present

## 2018-11-05 DIAGNOSIS — K219 Gastro-esophageal reflux disease without esophagitis: Secondary | ICD-10-CM | POA: Diagnosis not present

## 2018-11-05 DIAGNOSIS — I350 Nonrheumatic aortic (valve) stenosis: Secondary | ICD-10-CM | POA: Insufficient documentation

## 2018-11-05 DIAGNOSIS — I5022 Chronic systolic (congestive) heart failure: Secondary | ICD-10-CM | POA: Insufficient documentation

## 2018-11-05 DIAGNOSIS — Z885 Allergy status to narcotic agent status: Secondary | ICD-10-CM | POA: Diagnosis not present

## 2018-11-05 DIAGNOSIS — Z95810 Presence of automatic (implantable) cardiac defibrillator: Secondary | ICD-10-CM | POA: Insufficient documentation

## 2018-11-05 DIAGNOSIS — G4733 Obstructive sleep apnea (adult) (pediatric): Secondary | ICD-10-CM | POA: Diagnosis not present

## 2018-11-05 DIAGNOSIS — E1122 Type 2 diabetes mellitus with diabetic chronic kidney disease: Secondary | ICD-10-CM | POA: Insufficient documentation

## 2018-11-05 DIAGNOSIS — Z880 Allergy status to penicillin: Secondary | ICD-10-CM | POA: Diagnosis not present

## 2018-11-05 DIAGNOSIS — I10 Essential (primary) hypertension: Secondary | ICD-10-CM | POA: Diagnosis not present

## 2018-11-05 DIAGNOSIS — I252 Old myocardial infarction: Secondary | ICD-10-CM | POA: Insufficient documentation

## 2018-11-05 DIAGNOSIS — F329 Major depressive disorder, single episode, unspecified: Secondary | ICD-10-CM | POA: Insufficient documentation

## 2018-11-05 DIAGNOSIS — Z79899 Other long term (current) drug therapy: Secondary | ICD-10-CM | POA: Insufficient documentation

## 2018-11-05 DIAGNOSIS — E7800 Pure hypercholesterolemia, unspecified: Secondary | ICD-10-CM | POA: Insufficient documentation

## 2018-11-05 DIAGNOSIS — D649 Anemia, unspecified: Secondary | ICD-10-CM | POA: Diagnosis not present

## 2018-11-05 DIAGNOSIS — Z7984 Long term (current) use of oral hypoglycemic drugs: Secondary | ICD-10-CM | POA: Diagnosis not present

## 2018-11-05 DIAGNOSIS — I2510 Atherosclerotic heart disease of native coronary artery without angina pectoris: Secondary | ICD-10-CM | POA: Diagnosis not present

## 2018-11-05 MED ORDER — HYDRALAZINE HCL 50 MG PO TABS: 75.0000 mg | ORAL_TABLET | Freq: Three times a day (TID) | ORAL | 3 refills | Status: DC

## 2018-11-05 MED ORDER — HYDRALAZINE HCL 50 MG PO TABS: 75.0000 mg | ORAL_TABLET | Freq: Three times a day (TID) | ORAL | 6 refills | Status: DC

## 2018-11-05 MED ORDER — SPIRONOLACTONE 25 MG PO TABS: 25.0000 mg | ORAL_TABLET | Freq: Every day | ORAL | 3 refills | Status: DC

## 2018-11-05 MED ORDER — SPIRONOLACTONE 25 MG PO TABS: 25.0000 mg | ORAL_TABLET | Freq: Every day | ORAL | 6 refills | Status: DC

## 2018-11-05 NOTE — Addendum Note (Signed)
Encounter addended by: Valeda Malm, RN on: 11/05/2018 11:33 AM  Actions taken: Visit diagnoses modified, Order list changed, Diagnosis association updated, Clinical Note Signed

## 2018-11-05 NOTE — Progress Notes (Signed)
Echocardiogram 2D Echocardiogram has been performed.  Michelle Andrade 11/05/2018, 10:50 AM

## 2018-11-05 NOTE — Progress Notes (Signed)
Advanced Heart Failure Clinic Note   Primary Cardiologist: Dr. Haroldine Laws  Nephrology: Dr Valaria Good [facility].   HPI:  Michelle Andrade is a 58 year old female with a past medical history of HTN, chronic systolic CHF s/p BI V ICD ([employer]),atrial tachycardia (2017), NICM (normal cors in 2014), CKD stage III, HLD, DM, OSA w/CPAP, and anemia.  She was admitted 12/26/16-12/27/16 with volume overload at [facility]. Echo that admission with EF 25-30%. She was discharged on Spiro 25mg  daily, Coreg 25mg  BID,and hydralazine 12.5mg  daily. Discharge weight was 177 pounds.   Her baseline creatinine appears to be 1.4-1.6 when reviewing her records from [facility]. Her last office visit with her Cardiologist Dr. Otho Perl was in January 2018 . He noted angioedema with ACE-I and cough with ARB.   She presented to the ED on 02/09/17 with SOB and abdominal bloating. Started on IV Lasix. RHC done and showed Fick output/index 4.5/2.5. PVR 3.8 WU. Discharge weight was 176 pounds.  Returns for HF follow-up. Here with Zack from paramedicine.Says she is taking all her medicines. Not following her diet at all. Eating out regularly with banana splits and chinese food. HgbA1c > 10. Weight stable. Starts Heart Strides next week. No edema, orthopnea or PND. Has to walk slow or gets SOB. + bendopnea  Echo today EF 25-30% Personally reviewed    Review of systems complete and found to be negative unless listed in HPI.    Past Medical History:  Diagnosis Date  . Arthritis 02/08/2017  . 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 CAD (coronary artery disease) 09/01/2013  . CHF (congestive heart failure) ([place]) 06/07/2015  . CKD (chronic kidney disease), stage III ([place]) 12/09/2013  . Depression 07/27/2013  . Diabetes mellitus without complication ([place]) 21/22/4825  . GERD (gastroesophageal reflux disease) 07/27/2013  . Gout 12/29/2013  . Heart valve disorder  06/03/2013  . High cholesterol 11/10/2013  . Hypertension 11/10/2013  . IBS (irritable bowel syndrome) 09/27/2014  . Myocardial infarct ([place])   . Sleep apnea 10/13/2013  . Vascular disorder 01/12/2014    Current Outpatient Medications  Medication Sig Dispense Refill  . amiodarone (PACERONE) 200 MG tablet Take 200 mg by mouth daily.    [name] aspirin 81 MG chewable tablet Chew 81 mg by mouth daily.    . Calcium Carbonate-Vitamin D 600-400 MG-UNIT tablet Take by mouth.    . Calcium Carbonate-Vitamin D3 (CALCIUM 600/VITAMIN D) 600-400 MG-UNIT TABS Take 1 tablet by mouth 2 (two) times daily.     . carvedilol (COREG) 25 MG tablet Take 12.5 mg by mouth 2 (two) times daily with a meal.    . diclofenac sodium (VOLTAREN) 1 % GEL Apply 2 g topically 4 (four) times daily. 100 g 0  . ferrous sulfate 325 (65 FE) MG tablet Take 325 mg by mouth 3 (three) times daily.     [name] glipiZIDE (GLUCOTROL) 5 MG tablet Take 2.5 mg by mouth daily before breakfast.    . hydrALAZINE (APRESOLINE) 50 MG tablet Take 1 tablet (50 mg total) by mouth 3 (three) times daily. 90 tablet 1  . Investigational - Study Medication Take 1 tablet by mouth 2 (two) times daily. Study name: Galactic Heart Failure Study Additional study details: Omecamtiv Mecarbil or Placebo 1 each PRN  . isosorbide mononitrate (IMDUR) 60 MG 24 hr tablet Take 1 tablet (60 mg total) by mouth daily. 30 tablet 0  . loratadine (CLARITIN) 10 MG tablet Take 10 mg by mouth  daily.    . Magnesium Oxide 420 MG TABS Take 840 mg by mouth daily.    . metolazone (ZAROXOLYN) 2.5 MG tablet Take 1 tablet (2.5 mg total) by mouth once a week. Monday 12 tablet 0  . Multiple Vitamin (MULTIVITAMIN) tablet Take 1 tablet by mouth daily.    [name] omeprazole (PRILOSEC) 40 MG capsule Take 40 mg by mouth daily.    . potassium chloride (K-DUR,KLOR-CON) 10 MEQ tablet Take 4 tablets (40 mEq total) by mouth 2 (two) times daily. 240 tablet 11  . rOPINIRole (REQUIP) 0.5 MG tablet Take 0.5 mg by  mouth at bedtime.     . sertraline (ZOLOFT) 100 MG tablet Take 150 mg by mouth daily.    . simvastatin (ZOCOR) 20 MG tablet Take 20 mg by mouth daily.    [name] spironolactone (ALDACTONE) 25 MG tablet Take 12.5 mg by mouth daily.    [name] torsemide (DEMADEX) 20 MG tablet Take 1 tablet (20 mg total) by mouth 2 (two) times daily. 120 tablet 3   No current facility-administered medications for this encounter.    Allergies  Allergen Reactions  . Codeine Itching  . Gabapentin Itching  . Haldol [Haloperidol Lactate] Other (See Comments)    Dizziness, hallucinations  . Levofloxacin     Other reaction(s): Other (See Comments) Projectile vomiting   . Lisinopril Itching and Swelling  . Losartan Itching  . Morphine And Related Nausea And Vomiting  . Penicillins Nausea And Vomiting    "Vomiting, upset stomach, Headache" Has patient had a PCN reaction causing immediate rash, facial/tongue/throat swelling, SOB or lightheadedness with hypotension: No Has patient had a PCN reaction causing severe rash involving mucus membranes or skin necrosis: No Has patient had a PCN reaction that required hospitalization: No Has patient had a PCN reaction occurring within the last 10 years: No If all of the above answers are "NO", then may proceed with Cephalosporin use.    Social History   Socioeconomic History  . Marital status: Single    Spouse name: Not on file  . Number of children: Not on file  . Years of education: Not on file  . Highest education level: Not on file  Occupational History  . Not on file  Social Needs  . Financial resource strain: Not on file  . Food insecurity:    Worry: Not on file    Inability: Not on file  . Transportation needs:    Medical: Not on file    Non-medical: Not on file  Tobacco Use  . Smoking status: Never Smoker  . Smokeless tobacco: Never Used  Substance and Sexual Activity  . Alcohol use: No  . Drug use: No  . Sexual activity: Not on file  Lifestyle  .  Physical activity:    Days per week: Not on file    Minutes per session: Not on file  . Stress: Not on file  Relationships  . Social connections:    Talks on phone: Not on file    Gets together: Not on file    Attends religious service: Not on file    Active member of club or organization: Not on file    Attends meetings of clubs or organizations: Not on file    Relationship status: Not on file  . Intimate partner violence:    Fear of current or ex partner: Not on file    Emotionally abused: Not on file    Physically abused: Not on file    Forced  sexual activity: Not on file  Other Topics Concern  . Not on file  Social History Narrative  . Not on file   Family History  Problem Relation Age of Onset  . Hypertension Mother    Vitals:   11/05/18 1054  BP: 128/80  Pulse: 64  SpO2: 97%  Weight: 87.5 kg (192 lb 12.8 oz)   Wt Readings from Last 3 Encounters:  11/05/18 87.5 kg (192 lb 12.8 oz)  10/29/18 86.2 kg (190 lb)  10/15/18 85.7 kg (189 lb)    PHYSICAL EXAM: General:  Well appearing. No resp difficulty HEENT: normal Neck: supple. no JVD. Carotids 2+ bilat; no bruits. No lymphadenopathy or thryomegaly appreciated. Cor: PMI nondisplaced. Regular rate & rhythm. No rubs, gallops or murmurs. Lungs: clear Abdomen: obese soft, nontender, nondistended. No hepatosplenomegaly. No bruits or masses. Good bowel sounds. Extremities: no cyanosis, clubbing, rash, edema Neuro: alert & orientedx3, cranial nerves grossly intact. moves all 4 extremities w/o difficulty. Affect pleasant   ASSESSMENT & PLAN: 1. Chronic systolic CHF: NICM, normal cors in 2014, likely related to HTN vs. Noncompliance vs. Tachy - medicated with history of atrial tach. [employer]  BiV - Echo 02/2017 EF 20%, moderate MR, moderate TR. PA pressure 80 mm Hg.   - ECHO 08/07/2017 EF 20-25%.  - Echo today EF 25-30% Personally reviewed - Chronic NYHA III-IIIB - Volume status stable Continue torsemide 40 mg BID.  -  Continue metolazone 2.5 mg weekly.(Monday)  - Increase Spiro 25  mg qhs. - Continue isosorbide 60 mg daily - Increase hydralazine 37.5 mg TID - Continue Coreg 12.5 mg BID.  - No arb/dig with ckd III-IV. If creatinine remains stable - may be able to try Entresto.  - Discussed need for improved dietary compliance   2. CKD stage III: Baseline creatinine 1.3-1.6.  - Recent creatinine 1.66 with PCP  3. HTN - Stable.    4. History of atrial tachycardia - Check LFTs, TFTS  Glori Bickers, MD 11/05/18

## 2018-11-05 NOTE — Progress Notes (Signed)
Paramedicine Encounter   Patient ID: Michelle Andrade , female,   DOB: 02-24-1961,57 y.o.,  MRN: [id number]  Michelle Andrade was seen at the HF clinic today with Dr Haroldine Laws. This visit was a follow up to her echocardiogram. Per Dr Haroldine Laws her EF has increased slightly to 25-30%. He has ordered her to increased hydralazine to 75 mg TID and spironolactone to 25 mg daily. Her pillboxes were adjusted to reflect the changes and she was made aware of the medications she need to order from the [place].    Michelle Andrade, EMT 11/05/2018   ACTION: Next visit planned for 2 weeks

## 2018-11-05 NOTE — Addendum Note (Signed)
Encounter addended by: Valeda Malm, RN on: 11/05/2018 11:39 AM  Actions taken: Order list changed

## 2018-11-05 NOTE — Patient Instructions (Signed)
INCREASE Spironolactone to 25mg  (1 tab) daily  INCREASE Hydralazine to 75mg  (1.5 tabs) three times a day  Dr. Haroldine Laws requests that you have labs in 1 week We will only contact you if something comes back abnormal or we need to make some changes. Otherwise no news is good news!  Your physician recommends that you schedule a follow-up appointment in: 6 months. You will get a call to schedule this appointment.

## 2018-11-10 ENCOUNTER — Ambulatory Visit (INDEPENDENT_AMBULATORY_CARE_PROVIDER_SITE_OTHER): Payer: Medicare Other | Admitting: Family Medicine

## 2018-11-10 ENCOUNTER — Encounter: Payer: Self-pay | Admitting: Family Medicine

## 2018-11-10 VITALS — BP 103/64 | HR 72 | Ht 63.0 in | Wt 192.0 lb

## 2018-11-10 DIAGNOSIS — M7021 Olecranon bursitis, right elbow: Secondary | ICD-10-CM | POA: Diagnosis present

## 2018-11-10 MED ORDER — METHYLPREDNISOLONE ACETATE 40 MG/ML IJ SUSP
40.0000 mg | Freq: Once | INTRAMUSCULAR | Status: AC
  Administered 2018-11-10: 40 mg via INTRA_ARTICULAR

## 2018-11-10 NOTE — Progress Notes (Signed)
PCP: Henderson Baltimore, [name]  Subjective:   HPI: Patient is a 58 y.o. female here for right elbow swelling.  Patient reports with about 3 weeks of swelling over the right elbow.  She denies any specific injury.  She noticed it incidentally.  She does report pain over the elbow if she leans on a hard surface.  No history of similar swelling of the elbow.  No erythema or increased warmth.  No drainage.  No radiating pain.  No numbness or tingling distally.  She is not taking any medications.  Other than direct pressure nothing exacerbates the pain.  No overlying skin changes.  Past Medical History:  Diagnosis Date  . Arthritis 02/08/2017  . 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 CAD (coronary artery disease) 09/01/2013  . CHF (congestive heart failure) ([place]) 06/07/2015  . CKD (chronic kidney disease), stage III ([place]) 12/09/2013  . Depression 07/27/2013  . Diabetes mellitus without complication ([place]) 01/77/9390  . GERD (gastroesophageal reflux disease) 07/27/2013  . Gout 12/29/2013  . Heart valve disorder 06/03/2013  . High cholesterol 11/10/2013  . Hypertension 11/10/2013  . IBS (irritable bowel syndrome) 09/27/2014  . Myocardial infarct ([place])   . Sleep apnea 10/13/2013  . Vascular disorder 01/12/2014    Current Outpatient Medications on File Prior to Visit  Medication Sig Dispense Refill  . amiodarone (PACERONE) 200 MG tablet Take 200 mg by mouth daily.    [name] aspirin 81 MG chewable tablet Chew 81 mg by mouth daily.    . Calcium Carbonate-Vitamin D 600-400 MG-UNIT tablet Take by mouth.    . Calcium Carbonate-Vitamin D3 (CALCIUM 600/VITAMIN D) 600-400 MG-UNIT TABS Take 1 tablet by mouth 2 (two) times daily.     . carvedilol (COREG) 25 MG tablet Take 12.5 mg by mouth 2 (two) times daily with a meal.    . diclofenac sodium (VOLTAREN) 1 % GEL Apply 2 g topically 4 (four) times daily. 100 g 0  . ferrous sulfate 325 (65 FE) MG tablet Take 325 mg  by mouth 3 (three) times daily.     [name] glipiZIDE (GLUCOTROL) 5 MG tablet Take 2.5 mg by mouth daily before breakfast.    . hydrALAZINE (APRESOLINE) 50 MG tablet Take 1.5 tablets (75 mg total) by mouth 3 (three) times daily. 405 tablet 3  . Investigational - Study Medication Take 1 tablet by mouth 2 (two) times daily. Study name: Galactic Heart Failure Study Additional study details: Omecamtiv Mecarbil or Placebo 1 each PRN  . isosorbide mononitrate (IMDUR) 60 MG 24 hr tablet Take 1 tablet (60 mg total) by mouth daily. 30 tablet 0  . loratadine (CLARITIN) 10 MG tablet Take 10 mg by mouth daily.    . Magnesium Oxide 420 MG TABS Take 840 mg by mouth daily.    . metolazone (ZAROXOLYN) 2.5 MG tablet Take 1 tablet (2.5 mg total) by mouth once a week. Monday 12 tablet 0  . Multiple Vitamin (MULTIVITAMIN) tablet Take 1 tablet by mouth daily.    [name] omeprazole (PRILOSEC) 40 MG capsule Take 40 mg by mouth daily.    . potassium chloride (K-DUR,KLOR-CON) 10 MEQ tablet Take 4 tablets (40 mEq total) by mouth 2 (two) times daily. 240 tablet 11  . rOPINIRole (REQUIP) 0.5 MG tablet Take 0.5 mg by mouth at bedtime.     . sertraline (ZOLOFT) 100 MG tablet Take 150 mg by mouth daily.    . simvastatin (ZOCOR) 20 MG tablet Take 20 mg by mouth daily.    [name]  spironolactone (ALDACTONE) 25 MG tablet Take 1 tablet (25 mg total) by mouth daily. 90 tablet 3  . torsemide (DEMADEX) 20 MG tablet Take 1 tablet (20 mg total) by mouth 2 (two) times daily. 120 tablet 3   No current facility-administered medications on file prior to visit.     Past Surgical History:  Procedure Laterality Date  . ABDOMINAL HYSTERECTOMY    . CARDIAC DEFIBRILLATOR PLACEMENT    . CARPAL TUNNEL RELEASE    . HEEL SPUR EXCISION    . KNEE SURGERY    . PACEMAKER GENERATOR CHANGE    . RIGHT HEART CATH N/A 02/13/2017   Procedure: Right Heart Cath;  Surgeon: Jolaine Artist, MD;  Location: [facility];  Service: Cardiovascular;  Laterality: N/A;   . SHOULDER SURGERY      Allergies  Allergen Reactions  . Codeine Itching  . Gabapentin Itching  . Haldol [Haloperidol Lactate] Other (See Comments)    Dizziness, hallucinations  . Levofloxacin     Other reaction(s): Other (See Comments) Projectile vomiting   . Lisinopril Itching and Swelling  . Losartan Itching  . Morphine And Related Nausea And Vomiting  . Penicillins Nausea And Vomiting    "Vomiting, upset stomach, Headache" Has patient had a PCN reaction causing immediate rash, facial/tongue/throat swelling, SOB or lightheadedness with hypotension: No Has patient had a PCN reaction causing severe rash involving mucus membranes or skin necrosis: No Has patient had a PCN reaction that required hospitalization: No Has patient had a PCN reaction occurring within the last 10 years: No If all of the above answers are "NO", then may proceed with Cephalosporin use.     Social History   Socioeconomic History  . Marital status: Single    Spouse name: Not on file  . Number of children: Not on file  . Years of education: Not on file  . Highest education level: Not on file  Occupational History  . Not on file  Social Needs  . Financial resource strain: Not on file  . Food insecurity:    Worry: Not on file    Inability: Not on file  . Transportation needs:    Medical: Not on file    Non-medical: Not on file  Tobacco Use  . Smoking status: Never Smoker  . Smokeless tobacco: Never Used  Substance and Sexual Activity  . Alcohol use: No  . Drug use: No  . Sexual activity: Not on file  Lifestyle  . Physical activity:    Days per week: Not on file    Minutes per session: Not on file  . Stress: Not on file  Relationships  . Social connections:    Talks on phone: Not on file    Gets together: Not on file    Attends religious service: Not on file    Active member of club or organization: Not on file    Attends meetings of clubs or organizations: Not on file    Relationship  status: Not on file  . Intimate partner violence:    Fear of current or ex partner: Not on file    Emotionally abused: Not on file    Physically abused: Not on file    Forced sexual activity: Not on file  Other Topics Concern  . Not on file  Social History Narrative  . Not on file    Family History  Problem Relation Age of Onset  . Hypertension Mother     BP 103/64  Pulse 72   Ht 5\' 3"  (1.6 m)   Wt 192 lb (87.1 kg)   BMI 34.01 kg/m   Review of Systems: See HPI above.     Objective:  Physical Exam:  Gen: awake, alert, NAD, comfortable in exam room Pulm: breathing unlabored  Right elbow: No obvious bony deformities.  There is swelling over the olecranon which appears well contained.  There is no overlying erythema.  No skin changes.  No rash. Tenderness over the area of swelling/olecranon.  No increased warmth. Full range of motion of the elbow. 5/5 strength with elbow flexion and extension. N/V intact distally.  MS[facility] [place]: Limited ultrasound of the posterior right elbow shows a fluid filled bursa  Left elbow: No obvious deformities.  No swelling. No tenderness noted full range of motion 5/5 strength N/V intact distally   Assessment & Plan:  1. Right elbow olecranon burisitis- no concern for infection - Aspiration/injection performed today as below - F/u as needed  Procedure performed: Olecranon bursa aspiration/corticosteroid injection; palpation guided  Consent obtained and verified. Time-out conducted. Noted no overlying erythema, induration, or other signs of local infection. The RIGHT olecranon bursa was marked. The overlying skin was prepped in a sterile fashion.  25ga needled used to inject 1cc sensorcaine for local anesthetic Topical analgesic spray: Ethyl chloride. Joint: RIGHT Olecranon bursa Needle: 18GA, 1.5" Completed without difficulty. Meds: depomedrol 40mg   Advised to call if fevers/chills, erythema, induration, drainage, or persistent  bleeding.

## 2018-11-10 NOTE — Patient Instructions (Addendum)
You have olecranon bursitis. Ice the area 3-4 times a day for 15 minutes at a time Compression typically for 1-2 weeks to help keep swelling down. We aspirated and injected this today. Consider elbow pad for protection to prevent irritation and additional swelling. Follow up with me as needed for the elbow.

## 2018-11-11 ENCOUNTER — Encounter: Payer: Self-pay | Admitting: Family Medicine

## 2018-11-12 ENCOUNTER — Other Ambulatory Visit (HOSPITAL_COMMUNITY): Payer: Medicare Other

## 2018-11-13 ENCOUNTER — Ambulatory Visit (HOSPITAL_COMMUNITY)
Admission: RE | Admit: 2018-11-13 | Discharge: 2018-11-13 | Disposition: A | Discharge status: Home / Self Care | Payer: Medicare Other | Source: Ambulatory Visit | Attending: Internal Medicine | Admitting: Internal Medicine

## 2018-11-13 ENCOUNTER — Other Ambulatory Visit: Payer: Self-pay

## 2018-11-13 DIAGNOSIS — I5022 Chronic systolic (congestive) heart failure: Secondary | ICD-10-CM

## 2018-11-13 LAB — COMPREHENSIVE METABOLIC PANEL
ALK PHOS: 100 U/L (ref 38–126)
ALT: 18 U/L (ref 0–44)
AST: 22 U/L (ref 15–41)
Albumin: 3.4 g/dL — ABNORMAL LOW (ref 3.5–5.0)
Anion gap: 10 (ref 5–15)
BUN: 36 mg/dL — ABNORMAL HIGH (ref 6–20)
CALCIUM: 9.1 mg/dL (ref 8.9–10.3)
CO2: 23 mmol/L (ref 22–32)
CREATININE: 1.98 mg/dL — AB (ref 0.44–1.00)
Chloride: 100 mmol/L (ref 98–111)
GFR, EST AFRICAN AMERICAN: 31 mL/min — AB (ref >60)
GFR, EST NON AFRICAN AMERICAN: 27 mL/min — AB (ref >60)
Glucose, Bld: 224 mg/dL — ABNORMAL HIGH (ref 70–99)
Potassium: 3.9 mmol/L (ref 3.5–5.1)
Sodium: 133 mmol/L — ABNORMAL LOW (ref 135–145)
TOTAL PROTEIN: 7.8 g/dL (ref 6.5–8.1)
Total Bilirubin: 0.4 mg/dL (ref 0.3–1.2)

## 2018-11-13 LAB — T4, FREE: Free T4: 0.96 ng/dL (ref 0.82–1.77)

## 2018-11-13 LAB — TSH: TSH: 2.081 u[IU]/mL (ref 0.350–4.500)

## 2018-11-14 LAB — T3, FREE: T3, Free: 1.9 pg/mL — ABNORMAL LOW (ref 2.0–4.4)

## 2018-11-18 ENCOUNTER — Other Ambulatory Visit (HOSPITAL_COMMUNITY): Payer: Self-pay

## 2018-11-18 NOTE — Progress Notes (Signed)
Paramedicine Encounter    Patient ID: Michelle Andrade, female    DOB: 01-Dec-1960, 58 y.o.   MRN: [id number]   Patient Care Team: Henderson Baltimore, [name] as PCP - General (Nurse Practitioner) Jorge Ny, LCSW as Social Worker (Licensed Clinical Social Worker)  Patient Active Problem List   Diagnosis Date Noted  . Secondary hyperparathyroidism of renal origin ([place]) 03/20/2018  . Severe obesity (BMI 35.0-39.9) with comorbidity ([place]) 12/19/2017  . CKD (chronic kidney disease) stage 4, GFR 15-29 ml/min ([facility]) 06/07/2017  . Acute on chronic systolic (congestive) heart failure ([place])   . CHF (congestive heart failure) ([place]) 02/09/2017  . Pulmonary vascular congestion 02/09/2017  . Hypertension   . Sleep apnea   . Chondrocalcinosis of both knees 02/08/2017  . Primary osteoarthritis of both knees 02/08/2017  . Type 2 diabetes mellitus ([place]) 11/16/2016  . Borderline personality disorder ([place]) 10/17/2016  . Carpal tunnel syndrome 10/17/2016  . 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 Eczema 07/03/2016  . Atrial tachycardia ([place]) 05/31/2016  . 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 Onychomycosis of toenail 06/08/2015  . Cardiomyopathy ([place]) 06/07/2015  . Irritable bowel syndrome 09/27/2014  . PAD (peripheral artery disease) ([place]) 01/12/2014  . History of DVT (deep vein thrombosis) 01/12/2014  . Gout 12/29/2013  . Iron deficiency anemia 12/29/2013  . Lichenification and lichen simplex chronicus 12/26/2013  . Hyperlipidemia associated with type 2 diabetes mellitus ([place]) 11/10/2013  . Benign hypertension with chronic kidney disease, stage IV ([place]) 11/10/2013  . Disorder of magnesium metabolism 10/13/2013  . CAD in native artery 09/01/2013  . Anxiety state 07/27/2013  . Depressive disorder 07/27/2013  . Gastro-esophageal reflux disease without esophagitis 07/27/2013  . Low back pain 06/03/2013  .  Mitral valve disease 06/03/2013  . Pulmonary valve disorder 06/03/2013  . Tricuspid valve disorder 06/03/2013  . 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 Allergic rhinitis 02/07/2013  . 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 Pain in joint 02/07/2013    Current Outpatient Medications:  .  amiodarone (PACERONE) 200 MG tablet, Take 200 mg by mouth daily., Disp: , Rfl:  .  aspirin 81 MG chewable tablet, Chew 81 mg by mouth daily., Disp: , Rfl:  .  Calcium Carbonate-Vitamin D3 (CALCIUM 600/VITAMIN D) 600-400 MG-UNIT TABS, Take 1 tablet by mouth 2 (two) times daily. , Disp: , Rfl:  .  carvedilol (COREG) 25 MG tablet, Take 12.5 mg by mouth 2 (two) times daily with a meal., Disp: , Rfl:  .  diclofenac sodium (VOLTAREN) 1 % GEL, Apply 2 g topically 4 (four) times daily., Disp: 100 g, Rfl: 0 .  ferrous sulfate 325 (65 FE) MG tablet, Take 325 mg by mouth 3 (three) times daily. , Disp: , Rfl:  .  glipiZIDE (GLUCOTROL) 5 MG tablet, Take 2.5 mg by mouth daily before breakfast., Disp: , Rfl:  .  hydrALAZINE (APRESOLINE) 50 MG tablet, Take 1.5 tablets (75 mg total) by mouth 3 (three) times daily., Disp: 405 tablet, Rfl: 3 .  Investigational - Study Medication, Take 1 tablet by mouth 2 (two) times daily. Study name: Galactic Heart Failure Study Additional study details: Omecamtiv Mecarbil or Placebo, Disp: 1 each, Rfl: PRN .  isosorbide mononitrate (IMDUR) 60 MG 24 hr tablet, Take 1 tablet (60 mg total) by mouth daily., Disp: 30 tablet, Rfl: 0 .  loratadine (CLARITIN) 10 MG tablet, Take 10 mg by mouth daily., Disp: , Rfl:  .  Magnesium Oxide 420  MG TABS, Take 840 mg by mouth daily., Disp: , Rfl:  .  metolazone (ZAROXOLYN) 2.5 MG tablet, Take 1 tablet (2.5 mg total) by mouth once a week. Monday, Disp: 12 tablet, Rfl: 0 .  Multiple Vitamin (MULTIVITAMIN) tablet, Take 1 tablet by mouth daily., Disp: , Rfl:  .  omeprazole (PRILOSEC) 40 MG capsule, Take 40 mg by mouth daily., Disp: , Rfl:  .  potassium chloride  (K-DUR,KLOR-CON) 10 MEQ tablet, Take 4 tablets (40 mEq total) by mouth 2 (two) times daily., Disp: 240 tablet, Rfl: 11 .  rOPINIRole (REQUIP) 0.5 MG tablet, Take 0.5 mg by mouth at bedtime. , Disp: , Rfl:  .  sertraline (ZOLOFT) 100 MG tablet, Take 150 mg by mouth daily., Disp: , Rfl:  .  simvastatin (ZOCOR) 20 MG tablet, Take 20 mg by mouth daily., Disp: , Rfl:  .  spironolactone (ALDACTONE) 25 MG tablet, Take 1 tablet (25 mg total) by mouth daily., Disp: 90 tablet, Rfl: 3 .  torsemide (DEMADEX) 20 MG tablet, Take 1 tablet (20 mg total) by mouth 2 (two) times daily., Disp: 120 tablet, Rfl: 3 .  Calcium Carbonate-Vitamin D 600-400 MG-UNIT tablet, Take by mouth., Disp: , Rfl:  Allergies  Allergen Reactions  . Codeine Itching  . Gabapentin Itching  . Haldol [Haloperidol Lactate] Other (See Comments)    Dizziness, hallucinations  . Levofloxacin     Other reaction(s): Other (See Comments) Projectile vomiting   . Lisinopril Itching and Swelling  . Losartan Itching  . Morphine And Related Nausea And Vomiting  . Penicillins Nausea And Vomiting    "Vomiting, upset stomach, Headache" Has patient had a PCN reaction causing immediate rash, facial/tongue/throat swelling, SOB or lightheadedness with hypotension: No Has patient had a PCN reaction causing severe rash involving mucus membranes or skin necrosis: No Has patient had a PCN reaction that required hospitalization: No Has patient had a PCN reaction occurring within the last 10 years: No If all of the above answers are "NO", then may proceed with Cephalosporin use.       Social History   Socioeconomic History  . Marital status: Single    Spouse name: Not on file  . Number of children: Not on file  . Years of education: Not on file  . Highest education level: Not on file  Occupational History  . Not on file  Social Needs  . Financial resource strain: Not on file  . Food insecurity:    Worry: Not on file    Inability: Not on file   . Transportation needs:    Medical: Not on file    Non-medical: Not on file  Tobacco Use  . Smoking status: Never Smoker  . Smokeless tobacco: Never Used  Substance and Sexual Activity  . Alcohol use: No  . Drug use: No  . Sexual activity: Not on file  Lifestyle  . Physical activity:    Days per week: Not on file    Minutes per session: Not on file  . Stress: Not on file  Relationships  . Social connections:    Talks on phone: Not on file    Gets together: Not on file    Attends religious service: Not on file    Active member of club or organization: Not on file    Attends meetings of clubs or organizations: Not on file    Relationship status: Not on file  . Intimate partner violence:    Fear of current or ex partner: Not  on file    Emotionally abused: Not on file    Physically abused: Not on file    Forced sexual activity: Not on file  Other Topics Concern  . Not on file  Social History Narrative  . Not on file    Physical Exam Cardiovascular:     Rate and Rhythm: Normal rate and regular rhythm.     Pulses: Normal pulses.  Pulmonary:     Effort: Pulmonary effort is normal.     Breath sounds: Normal breath sounds.  Abdominal:     General: There is no distension.     Palpations: Abdomen is soft.  Musculoskeletal: Normal range of motion.     Right lower leg: No edema.     Left lower leg: No edema.  Skin:    General: Skin is warm and dry.     Capillary Refill: Capillary refill takes less than 2 seconds.  Neurological:     Mental Status: She is alert and oriented to person, place, and time.  Psychiatric:        Mood and Affect: Mood normal.         Future Appointments  Date Time Provider [place]  12/19/2018 10:00 AM [place] [facility] None    BP 114/68 (BP Location: Left Arm, Patient Position: Sitting, Cuff Size: Large)   Pulse 76   Resp 18   Wt 189 lb 3.2 oz (85.8 kg)   SpO2 98%   BMI 33.52 kg/m   Weight  yesterday- Did not weigh Last visit weight-192 lb  Ms Shamoon was seen at home and reported feeling generally well. She denied chest pain, SOB, headache, dizziness or increased orthopnea. She reported she has been compliant with her medications over the past week and her weight has been stable. Her medications were verified and her pillbox was refilled.   [name], EMT 11/18/18  ACTION: Home visit completed Next visit planned for 1 week

## 2018-11-20 ENCOUNTER — Telehealth (HOSPITAL_COMMUNITY): Payer: Self-pay | Admitting: Licensed Clinical Social Worker

## 2018-11-20 NOTE — Telephone Encounter (Signed)
CSW reached out to pt to check in regarding food and medication status at this time. Pt reports she just had her medications refilled and that she and her sister have gotten plenty of groceries.  CSW encouraged pt to reach out with any concerns and will continue to follow and assist as needed  Jorge Ny, [place] Worker [facility] [phone]

## 2018-12-03 ENCOUNTER — Other Ambulatory Visit (HOSPITAL_COMMUNITY): Payer: Self-pay

## 2018-12-03 NOTE — Progress Notes (Signed)
Paramedicine Encounter    Patient ID: Michelle Andrade, female    DOB: 02/28/1961, 58 y.o.   MRN: [id number]   Patient Care Team: Henderson Baltimore, [name] as PCP - General (Nurse Practitioner) Jorge Ny, LCSW as Social Worker (Licensed Clinical Social Worker)  Patient Active Problem List   Diagnosis Date Noted  . Secondary hyperparathyroidism of renal origin ([place]) 03/20/2018  . Severe obesity (BMI 35.0-39.9) with comorbidity ([place]) 12/19/2017  . CKD (chronic kidney disease) stage 4, GFR 15-29 ml/min (H[facility]) 06/07/2017  . Acute on chronic systolic (congestive) heart failure ([place])   . CHF (congestive heart failure) ([place]) 02/09/2017  . Pulmonary vascular congestion 02/09/2017  . Hypertension   . Sleep apnea   . Chondrocalcinosis of both knees 02/08/2017  . Primary osteoarthritis of both knees 02/08/2017  . Type 2 diabetes mellitus ([place]) 11/16/2016  . Borderline personality disorder ([place]) 10/17/2016  . Carpal tunnel syndrome 10/17/2016  . 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 Eczema 07/03/2016  . Atrial tachycardia ([place]) 05/31/2016  . 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 Onychomycosis of toenail 06/08/2015  . Cardiomyopathy ([place]) 06/07/2015  . Irritable bowel syndrome 09/27/2014  . PAD (peripheral artery disease) ([place]) 01/12/2014  . History of DVT (deep vein thrombosis) 01/12/2014  . Gout 12/29/2013  . Iron deficiency anemia 12/29/2013  . Lichenification and lichen simplex chronicus 12/26/2013  . Hyperlipidemia associated with type 2 diabetes mellitus ([place]) 11/10/2013  . Benign hypertension with chronic kidney disease, stage IV ([place]) 11/10/2013  . Disorder of magnesium metabolism 10/13/2013  . CAD in native artery 09/01/2013  . Anxiety state 07/27/2013  . Depressive disorder 07/27/2013  . Gastro-esophageal reflux disease without esophagitis 07/27/2013  . Low back pain 06/03/2013  .  Mitral valve disease 06/03/2013  . Pulmonary valve disorder 06/03/2013  . Tricuspid valve disorder 06/03/2013  . 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 Allergic rhinitis 02/07/2013  . 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 Pain in joint 02/07/2013    Current Outpatient Medications:  .  amiodarone (PACERONE) 200 MG tablet, Take 200 mg by mouth daily., Disp: , Rfl:  .  aspirin 81 MG chewable tablet, Chew 81 mg by mouth daily., Disp: , Rfl:  .  Calcium Carbonate-Vitamin D3 (CALCIUM 600/VITAMIN D) 600-400 MG-UNIT TABS, Take 1 tablet by mouth 2 (two) times daily. , Disp: , Rfl:  .  carvedilol (COREG) 25 MG tablet, Take 12.5 mg by mouth 2 (two) times daily with a meal., Disp: , Rfl:  .  diclofenac sodium (VOLTAREN) 1 % GEL, Apply 2 g topically 4 (four) times daily., Disp: 100 g, Rfl: 0 .  ferrous sulfate 325 (65 FE) MG tablet, Take 325 mg by mouth 3 (three) times daily. , Disp: , Rfl:  .  glipiZIDE (GLUCOTROL) 5 MG tablet, Take 2.5 mg by mouth daily before breakfast., Disp: , Rfl:  .  hydrALAZINE (APRESOLINE) 50 MG tablet, Take 1.5 tablets (75 mg total) by mouth 3 (three) times daily., Disp: 405 tablet, Rfl: 3 .  Investigational - Study Medication, Take 1 tablet by mouth 2 (two) times daily. Study name: [facility] Heart Failure Study Additional study details: Omecamtiv Mecarbil or Placebo, Disp: 1 each, Rfl: PRN .  isosorbide mononitrate (IMDUR) 60 MG 24 hr tablet, Take 1 tablet (60 mg total) by mouth daily., Disp: 30 tablet, Rfl: 0 .  loratadine (CLARITIN) 10 MG tablet, Take 10 mg by mouth daily., Disp: , Rfl:  .  Magnesium Oxide 420  MG TABS, Take 840 mg by mouth daily., Disp: , Rfl:  .  metolazone (ZAROXOLYN) 2.5 MG tablet, Take 1 tablet (2.5 mg total) by mouth once a week. Monday, Disp: 12 tablet, Rfl: 0 .  Multiple Vitamin (MULTIVITAMIN) tablet, Take 1 tablet by mouth daily., Disp: , Rfl:  .  omeprazole (PRILOSEC) 40 MG capsule, Take 40 mg by mouth daily., Disp: , Rfl:  .  potassium chloride  (K-DUR,KLOR-CON) 10 MEQ tablet, Take 4 tablets (40 mEq total) by mouth 2 (two) times daily., Disp: 240 tablet, Rfl: 11 .  rOPINIRole (REQUIP) 0.5 MG tablet, Take 0.5 mg by mouth at bedtime. , Disp: , Rfl:  .  sertraline (ZOLOFT) 100 MG tablet, Take 150 mg by mouth daily., Disp: , Rfl:  .  simvastatin (ZOCOR) 20 MG tablet, Take 20 mg by mouth daily., Disp: , Rfl:  .  spironolactone (ALDACTONE) 25 MG tablet, Take 1 tablet (25 mg total) by mouth daily., Disp: 90 tablet, Rfl: 3 .  torsemide (DEMADEX) 20 MG tablet, Take 1 tablet (20 mg total) by mouth 2 (two) times daily., Disp: 120 tablet, Rfl: 3 .  Calcium Carbonate-Vitamin D 600-400 MG-UNIT tablet, Take by mouth., Disp: , Rfl:  Allergies  Allergen Reactions  . Codeine Itching  . Gabapentin Itching  . Haldol [Haloperidol Lactate] Other (See Comments)    Dizziness, hallucinations  . Levofloxacin     Other reaction(s): Other (See Comments) Projectile vomiting   . Lisinopril Itching and Swelling  . Losartan Itching  . Morphine And Related Nausea And Vomiting  . Penicillins Nausea And Vomiting    "Vomiting, upset stomach, Headache" Has patient had a PCN reaction causing immediate rash, facial/tongue/throat swelling, SOB or lightheadedness with hypotension: No Has patient had a PCN reaction causing severe rash involving mucus membranes or skin necrosis: No Has patient had a PCN reaction that required hospitalization: No Has patient had a PCN reaction occurring within the last 10 years: No If all of the above answers are "NO", then may proceed with Cephalosporin use.       Social History   Socioeconomic History  . Marital status: Single    Spouse name: Not on file  . Number of children: Not on file  . Years of education: Not on file  . Highest education level: Not on file  Occupational History  . Not on file  Social Needs  . Financial resource strain: Not on file  . Food insecurity:    Worry: Not on file    Inability: Not on file   . Transportation needs:    Medical: Not on file    Non-medical: Not on file  Tobacco Use  . Smoking status: Never Smoker  . Smokeless tobacco: Never Used  Substance and Sexual Activity  . Alcohol use: No  . Drug use: No  . Sexual activity: Not on file  Lifestyle  . Physical activity:    Days per week: Not on file    Minutes per session: Not on file  . Stress: Not on file  Relationships  . Social connections:    Talks on phone: Not on file    Gets together: Not on file    Attends religious service: Not on file    Active member of club or organization: Not on file    Attends meetings of clubs or organizations: Not on file    Relationship status: Not on file  . Intimate partner violence:    Fear of current or ex partner: Not  on file    Emotionally abused: Not on file    Physically abused: Not on file    Forced sexual activity: Not on file  Other Topics Concern  . Not on file  Social History Narrative  . Not on file    Physical Exam Cardiovascular:     Rate and Rhythm: Normal rate and regular rhythm.     Pulses: Normal pulses.  Pulmonary:     Effort: Pulmonary effort is normal.     Breath sounds: Normal breath sounds.  Musculoskeletal: Normal range of motion.     Right lower leg: No edema.     Left lower leg: No edema.  Skin:    General: Skin is warm and dry.     Capillary Refill: Capillary refill takes less than 2 seconds.  Neurological:     Mental Status: She is alert and oriented to person, place, and time.  Psychiatric:        Mood and Affect: Mood normal.         Future Appointments  Date Time Provider [place]  12/19/2018 10:00 AM [place] [facility] None    BP 117/66 (BP Location: Left Arm, Patient Position: Sitting, Cuff Size: Normal)   Pulse 71   Resp 16   Wt 191 lb 9.6 oz (86.9 kg)   SpO2 98%   BMI 33.94 kg/m   Weight yesterday- Did not weigh Last visit weight- 189.2 lb  Ms Farrey was seen at home  today and reported feeling well. She denied chest pain, SOB, headache, or orthopnea. She did say she had some dizziness when standing which takes longer to resolve than usual and this has been going on for the past 2 weeks. Her vital signs were stable and the dizziness did not start until two weeks after her medication change. I advised that she try standing slower from a seated position to se if this helps with her dizzy spells. She was understanding and agreeable. Her medications were verified and her pillbox was refilled.   [name], EMT 12/03/18  ACTION: Home visit completed Next visit planned for 2 weeks

## 2018-12-04 ENCOUNTER — Telehealth (HOSPITAL_COMMUNITY): Payer: Self-pay | Admitting: Licensed Clinical Social Worker

## 2018-12-04 NOTE — Telephone Encounter (Signed)
Patient identified as a candidate to receive 14 heart healthy meals per week for 3 months through [facility] partnership with [employer] program.   Completed referral sent in for review.  Anticipate patient will receive first shipment of food in 1-3 business days.  Dearis Danis H. Daphnie Venturini, LCSW Clinical Social Worker [facility] Desk#: [phone] Cell#: [phone]   

## 2018-12-09 ENCOUNTER — Encounter: Payer: Self-pay | Admitting: *Deleted

## 2018-12-09 ENCOUNTER — Telehealth: Payer: Self-pay | Admitting: *Deleted

## 2018-12-09 NOTE — Telephone Encounter (Signed)
Called subject to cancel appointment 12/19/18.  No cos, subject is not driving now and will need to be contacted next week for medication delivery to her front porch.

## 2018-12-10 ENCOUNTER — Ambulatory Visit (INDEPENDENT_AMBULATORY_CARE_PROVIDER_SITE_OTHER): Payer: Medicare Other | Admitting: Family Medicine

## 2018-12-10 ENCOUNTER — Other Ambulatory Visit: Payer: Self-pay

## 2018-12-10 ENCOUNTER — Encounter: Payer: Self-pay | Admitting: Family Medicine

## 2018-12-10 VITALS — BP 113/76 | HR 84 | Temp 98.1°F | Ht 63.0 in | Wt 192.0 lb

## 2018-12-10 DIAGNOSIS — M109 Gout, unspecified: Secondary | ICD-10-CM

## 2018-12-10 MED ORDER — METHYLPREDNISOLONE ACETATE 40 MG/ML IJ SUSP
40.0000 mg | Freq: Once | INTRAMUSCULAR | Status: AC
  Administered 2018-12-10: 09:00:00 40 mg via INTRA_ARTICULAR

## 2018-12-10 NOTE — Progress Notes (Signed)
  Michelle Andrade - 58 y.o. female MRN [id number]  Date of birth: 1961/02/06   Chief complaint: Right knee swelling  SUBJECTIVE:    History of present illness: 58yo female who presents with 3d hx of R knee swelling. Denies any injury or trauma to the area. She has a hx of gout in her R great toe in the past. Denies any changes to her diet. Since her knee became swollen, she states it is painful to ambulate. Pain is rated 10/10 worse with weight bearing activities, sharp. It improves with rest, elevation, ice, and heat. Denies any mechanical sx of popping, locking, or giving way. No associated numbness or tingling. No hip pain or ankle pain. She saw her primary care physician who prescribed colchicine for her. She has taken two doses of this thus far without any improvement in sx. Denies fevers, chills, or night sweats.   Review of systems:  Per HPI; in addition no fever, no rash, no additional weakness, no additional numbness, no additional paresthesias, and no additional fall/injury   Interval past medical history, surgical history, family history, and social history obtained and are unchanged. Of note, she has a hx of gout in her R great toe. She also has: DM II non insulin dependent, CKD III, CHF, HTN. Hx of L knee arthroscopy. She is a non smoker.  Medications reviewed. Of note, she is on colchicine as a recent addition. Allergies reviewed. Of note, allergies to: morphine, PCN, losartan, lisinopril, Levaquin, Haldol, Gabapentin, codeine.  OBJECTIVE:  Physical exam: Vital signs are reviewed. BP 113/76, HR 84, T98.1, Height 63", weight 192lbs.  Gen.: Alert, oriented, appears stated age, in no apparent distress, in a wheelchair Integumentary: No rashes or erythema. Palpable warmth and swelling over the R knee Neurologic: nonfocal Gait: nonweightbearing 2/2 to pain on RLE Psych: Normal affect, mood is described as good Musculoskeletal: Inspection of the right knee demonstrates obvious  swelling with palpable warmth. Large effusion palpated. TTP diffusely about the knee. Limited flexion of 90 degrees of right knee 2/2 to pain and swelling. Full extension although painful. Strength testing limited 2/2 to guarding although F and E intact. Neg Lachman. Neg anterior/posterior drawer. Neg McMurray. Stable to Varus/Valgus stress. N/v intact.  Inspection of the left knee demonstrates no obvious swelling or deformity. Prior surgical scars healed. No TTP. Full ROM. Strength 5/5 of LLE. Neg ant/post drawer. Neg Lachman. Neg McMurray. Stable to Varus/Valgus. N/v intact.    ASSESSMENT & PLAN: Acute gout of right knee INJECTION: Patient was given informed consent, signed copy in the chart. Appropriate time out was taken. Area prepped and draped in usual sterile fashion. 3cc of 1% sensoricaine without epinephrine was used to anesthetize a tract. Using a superolateral approach, 18 gauge needle was inserted into the right knee. 60cc of cloudy fluid was aspirated from the knee. Ultrasound confirmed proper positioning and fluid aspiration.  1 cc of methylprednisolone 40 mg/ml plus  3 cc of 1% sensoricaine without epinephrine was injected.The patient tolerated the procedure well. There were no complications. Post procedure instructions were given.   Education on gout prevention and low purine diet given. She may take colchicine for the next day until the steroid has starting working. Rx for walker given to be used for assisted ambulation. Counseled on signs and sx of infection to look out for. Follow up in 4-6 weeks if sx reoccur.   Clydene Laming, DO Sports Medicine Fellow [facility]

## 2018-12-10 NOTE — Assessment & Plan Note (Signed)
INJECTION: Patient was given informed consent, signed copy in the chart. Appropriate time out was taken. Area prepped and draped in usual sterile fashion. 3cc of 1% sensoricaine without epinephrine was used to anesthetize a tract. Using a superolateral approach, 18 gauge needle was inserted into the right knee. 60cc of cloudy fluid was aspirated from the knee. Ultrasound confirmed proper positioning and fluid aspiration.  1 cc of methylprednisolone 40 mg/ml plus  3 cc of 1% sensoricaine without epinephrine was injected.The patient tolerated the procedure well. There were no complications. Post procedure instructions were given.   Education on gout prevention and low purine diet given. She may take colchicine for the next day until the steroid has starting working. Rx for walker given to be used for assisted ambulation. Counseled on signs and sx of infection to look out for. Follow up in 4-6 weeks if sx reoccur.

## 2018-12-10 NOTE — Patient Instructions (Addendum)
Today we drained fluid out of your knee. The fluid appears cloudy implying gout. We also injected with cortisone to treat the gout exacerbation. If you have repeat symptoms in 7-14d, please call for further instruction. Below is a list of Low purine foods to help decrease the chances for gout.   Low-Purine Eating Plan A low-purine eating plan involves making food choices to limit your intake of purine. Purine is a kind of uric acid. Too much uric acid in your blood can cause certain conditions, such as gout and kidney stones. Eating a low-purine diet can help control these conditions. What are tips for following this plan? Reading food labels   Avoid foods with saturated or Trans fat.  Check the ingredient list of grains-based foods, such as bread and cereal, to make sure that they contain whole grains.  Check the ingredient list of sauces or soups to make sure they do not contain meat or fish.  When choosing soft drinks, check the ingredient list to make sure they do not contain high-fructose corn syrup. Shopping  Buy plenty of fresh fruits and vegetables.  Avoid buying canned or fresh fish.  Buy dairy products labeled as low-fat or nonfat.  Avoid buying premade or processed foods. These foods are often high in fat, salt (sodium), and added sugar. Cooking  Use olive oil instead of butter when cooking. Oils like olive oil, canola oil, and sunflower oil contain healthy fats. Meal planning  Learn which foods do or do not affect you. If you find out that a food tends to cause your gout symptoms to flare up, avoid eating that food. You can enjoy foods that do not cause problems. If you have any questions about a food item, talk with your dietitian or health care provider.  Limit foods high in fat, especially saturated fat. Fat makes it harder for your body to get rid of uric acid.  Choose foods that are lower in fat and are lean sources of protein. General guidelines  Limit alcohol  intake to no more than 1 drink a day for nonpregnant women and 2 drinks a day for men. One drink equals 12 oz of beer, 5 oz of wine, or 1 oz of hard liquor. Alcohol can affect the way your body gets rid of uric acid.  Drink plenty of water to keep your urine clear or pale yellow. Fluids can help remove uric acid from your body.  If directed by your health care provider, take a vitamin C supplement.  Work with your health care provider and dietitian to develop a plan to achieve or maintain a healthy weight. Losing weight can help reduce uric acid in your blood. What foods are recommended? The items listed may not be a complete list. Talk with your dietitian about what dietary choices are best for you. Foods low in purines Foods low in purines do not need to be limited. These include:  All fruits.  All low-purine vegetables, pickles, and olives.  Breads, pasta, rice, cornbread, and popcorn. Cake and other baked goods.  All dairy foods.  Eggs, nuts, and nut butters.  Spices and condiments, such as salt, herbs, and vinegar.  Plant oils, butter, and margarine.  Water, sugar-free soft drinks, tea, coffee, and cocoa.  Vegetable-based soups, broths, sauces, and gravies. Foods moderate in purines Foods moderate in purines should be limited to the amounts listed.   cup of asparagus, cauliflower, spinach, mushrooms, or green peas, each day.  2/3 cup uncooked oatmeal, each day.  cup dry wheat bran or wheat germ, each day.  2-3 ounces of meat or poultry, each day.  4-6 ounces of shellfish, such as crab, lobster, oysters, or shrimp, each day.  1 cup cooked beans, peas, or lentils, each day.  Soup, broths, or bouillon made from meat or fish. Limit these foods as much as possible. What foods are not recommended? The items listed may not be a complete list. Talk with your dietitian about what dietary choices are best for you. Limit your intake of foods high in purines,  including:  Beer and other alcohol.  Meat-based gravy or sauce.  Canned or fresh fish, such as: ? Anchovies, sardines, herring, and tuna. ? Mussels and scallops. ? Codfish, trout, and haddock.  Berniece Salines.  Organ meats, such as: ? Liver or kidney. ? Tripe. ? Sweetbreads (thymus gland or pancreas).  Wild Clinical biochemist.  Yeast or yeast extract supplements.  Drinks sweetened with high-fructose corn syrup. Summary  Eating a low-purine diet can help control conditions caused by too much uric acid in the body, such as gout or kidney stones.  Choose low-purine foods, limit alcohol, and limit foods high in fat.  You will learn over time which foods do or do not affect you. If you find out that a food tends to cause your gout symptoms to flare up, avoid eating that food. This information is not intended to replace advice given to you by your health care provider. Make sure you discuss any questions you have with your health care provider. Document Released: 12/15/2010 Document Revised: 10/03/2016 Document Reviewed: 10/03/2016 Elsevier Interactive Patient Education  2019 [employer].

## 2018-12-16 ENCOUNTER — Telehealth (HOSPITAL_COMMUNITY): Payer: Self-pay

## 2018-12-16 NOTE — Telephone Encounter (Signed)
I called Michelle Andrade to schedule an appointment for this week. She stated she was available tomorrow all day so we agreed to meet at 09:30.

## 2018-12-17 ENCOUNTER — Other Ambulatory Visit (HOSPITAL_COMMUNITY): Payer: Self-pay

## 2018-12-17 NOTE — Progress Notes (Signed)
Paramedicine Encounter    Patient ID: Michelle Andrade, female    DOB: May 22, 1961, 58 y.o.   MRN: [id number]   Patient Care Team: Henderson Baltimore, [name] as PCP - General (Nurse Practitioner) Jorge Ny, LCSW as Social Worker (Licensed Clinical Social Worker)  Patient Active Problem List   Diagnosis Date Noted  . Secondary hyperparathyroidism of renal origin ([place]) 03/20/2018  . Severe obesity (BMI 35.0-39.9) with comorbidity ([place]) 12/19/2017  . CKD (chronic kidney disease) stage 4, GFR 15-29 ml/min (HCC) 06/07/2017  . Acute on chronic systolic (congestive) heart failure ([place])   . CHF (congestive heart failure) ([place]) 02/09/2017  . Pulmonary vascular congestion 02/09/2017  . Hypertension   . Sleep apnea   . Chondrocalcinosis of both knees 02/08/2017  . Primary osteoarthritis of both knees 02/08/2017  . Type 2 diabetes mellitus ([place]) 11/16/2016  . Borderline personality disorder ([place]) 10/17/2016  . Carpal tunnel syndrome 10/17/2016  . 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 Eczema 07/03/2016  . Atrial tachycardia ([place]) 05/31/2016  . 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 Onychomycosis of toenail 06/08/2015  . Cardiomyopathy ([place]) 06/07/2015  . Irritable bowel syndrome 09/27/2014  . PAD (peripheral artery disease) ([place]) 01/12/2014  . History of DVT (deep vein thrombosis) 01/12/2014  . Acute gout of right knee 12/29/2013  . Iron deficiency anemia 12/29/2013  . Lichenification and lichen simplex chronicus 12/26/2013  . Hyperlipidemia associated with type 2 diabetes mellitus ([place]) 11/10/2013  . Benign hypertension with chronic kidney disease, stage IV ([place]) 11/10/2013  . Disorder of magnesium metabolism 10/13/2013  . CAD in native artery 09/01/2013  . Anxiety state 07/27/2013  . Depressive disorder 07/27/2013  . Gastro-esophageal reflux disease without esophagitis 07/27/2013  . Low back  pain 06/03/2013  . Mitral valve disease 06/03/2013  . Pulmonary valve disorder 06/03/2013  . Tricuspid valve disorder 06/03/2013  . 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 Allergic rhinitis 02/07/2013  . 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 Pain in joint 02/07/2013    Current Outpatient Medications:  .  amiodarone (PACERONE) 200 MG tablet, Take 200 mg by mouth daily., Disp: , Rfl:  .  aspirin 81 MG chewable tablet, Chew 81 mg by mouth daily., Disp: , Rfl:  .  Calcium Carbonate-Vitamin D3 (CALCIUM 600/VITAMIN D) 600-400 MG-UNIT TABS, Take 1 tablet by mouth 2 (two) times daily. , Disp: , Rfl:  .  carvedilol (COREG) 25 MG tablet, Take 12.5 mg by mouth 2 (two) times daily with a meal., Disp: , Rfl:  .  colchicine 0.6 MG tablet, Take 2 tablets now and 1 an hour later, Disp: , Rfl:  .  diclofenac sodium (VOLTAREN) 1 % GEL, Apply 2 g topically 4 (four) times daily., Disp: 100 g, Rfl: 0 .  ferrous sulfate 325 (65 FE) MG tablet, Take 325 mg by mouth 3 (three) times daily. , Disp: , Rfl:  .  glipiZIDE (GLUCOTROL) 5 MG tablet, Take 2.5 mg by mouth daily before breakfast., Disp: , Rfl:  .  hydrALAZINE (APRESOLINE) 50 MG tablet, Take 1.5 tablets (75 mg total) by mouth 3 (three) times daily., Disp: 405 tablet, Rfl: 3 .  Investigational - Study Medication, Take 1 tablet by mouth 2 (two) times daily. Study name: Galactic Heart Failure Study Additional study details: Omecamtiv Mecarbil or Placebo, Disp: 1 each, Rfl: PRN .  isosorbide mononitrate (IMDUR) 60 MG 24 hr tablet, Take 1 tablet (60 mg total) by mouth daily., Disp: 30 tablet, Rfl:  0 .  loratadine (CLARITIN) 10 MG tablet, Take 10 mg by mouth daily., Disp: , Rfl:  .  Magnesium Oxide 420 MG TABS, Take 840 mg by mouth daily., Disp: , Rfl:  .  metolazone (ZAROXOLYN) 2.5 MG tablet, Take 1 tablet (2.5 mg total) by mouth once a week. Monday, Disp: 12 tablet, Rfl: 0 .  Multiple Vitamin (MULTIVITAMIN) tablet, Take 1 tablet by mouth daily., Disp: , Rfl:  .   omeprazole (PRILOSEC) 40 MG capsule, Take 40 mg by mouth daily., Disp: , Rfl:  .  potassium chloride (K-DUR,KLOR-CON) 10 MEQ tablet, Take 4 tablets (40 mEq total) by mouth 2 (two) times daily., Disp: 240 tablet, Rfl: 11 .  rOPINIRole (REQUIP) 0.5 MG tablet, Take 0.5 mg by mouth at bedtime. , Disp: , Rfl:  .  sertraline (ZOLOFT) 100 MG tablet, Take 150 mg by mouth daily., Disp: , Rfl:  .  simvastatin (ZOCOR) 20 MG tablet, Take 20 mg by mouth daily., Disp: , Rfl:  .  spironolactone (ALDACTONE) 25 MG tablet, Take 1 tablet (25 mg total) by mouth daily., Disp: 90 tablet, Rfl: 3 .  torsemide (DEMADEX) 20 MG tablet, Take 1 tablet (20 mg total) by mouth 2 (two) times daily., Disp: 120 tablet, Rfl: 3 .  Calcium Carbonate-Vitamin D 600-400 MG-UNIT tablet, Take by mouth., Disp: , Rfl:  Allergies  Allergen Reactions  . Codeine Itching  . Gabapentin Itching  . Haldol [Haloperidol Lactate] Other (See Comments)    Dizziness, hallucinations  . Levofloxacin     Other reaction(s): Other (See Comments) Projectile vomiting   . Lisinopril Itching and Swelling  . Losartan Itching  . Morphine And Related Nausea And Vomiting  . Penicillins Nausea And Vomiting    "Vomiting, upset stomach, Headache" Has patient had a PCN reaction causing immediate rash, facial/tongue/throat swelling, SOB or lightheadedness with hypotension: No Has patient had a PCN reaction causing severe rash involving mucus membranes or skin necrosis: No Has patient had a PCN reaction that required hospitalization: No Has patient had a PCN reaction occurring within the last 10 years: No If all of the above answers are "NO", then may proceed with Cephalosporin use.       Social History   Socioeconomic History  . Marital status: Single    Spouse name: Not on file  . Number of children: Not on file  . Years of education: Not on file  . Highest education level: Not on file  Occupational History  . Not on file  Social Needs  .  Financial resource strain: Not on file  . Food insecurity:    Worry: Not on file    Inability: Not on file  . Transportation needs:    Medical: Not on file    Non-medical: Not on file  Tobacco Use  . Smoking status: Never Smoker  . Smokeless tobacco: Never Used  Substance and Sexual Activity  . Alcohol use: No  . Drug use: No  . Sexual activity: Not on file  Lifestyle  . Physical activity:    Days per week: Not on file    Minutes per session: Not on file  . Stress: Not on file  Relationships  . Social connections:    Talks on phone: Not on file    Gets together: Not on file    Attends religious service: Not on file    Active member of club or organization: Not on file    Attends meetings of clubs or organizations: Not on file  Relationship status: Not on file  . Intimate partner violence:    Fear of current or ex partner: Not on file    Emotionally abused: Not on file    Physically abused: Not on file    Forced sexual activity: Not on file  Other Topics Concern  . Not on file  Social History Narrative  . Not on file    Physical Exam Cardiovascular:     Rate and Rhythm: Normal rate and regular rhythm.     Pulses: Normal pulses.  Pulmonary:     Effort: Pulmonary effort is normal.     Breath sounds: Normal breath sounds.  Musculoskeletal: Normal range of motion.     Right lower leg: No edema.     Left lower leg: No edema.  Skin:    General: Skin is warm and dry.     Capillary Refill: Capillary refill takes less than 2 seconds.  Neurological:     Mental Status: She is alert and oriented to person, place, and time.  Psychiatric:        Mood and Affect: Mood normal.         No future appointments.  BP 102/65 (BP Location: Left Arm, Patient Position: Sitting, Cuff Size: Normal)   Pulse 82   Resp 16   Wt 188 lb (85.3 kg)   SpO2 97%   BMI 33.30 kg/m   Weight yesterday- 188 lb Last visit weight- 191.6 lb  Michelle Andrade was seen at home today and reported  feeling well. She denied chest pain, SOB, headache, dizziness, orthopnea, cough or fever since our last visit. She reported being compliant with her medications since our last visit and her weight has been stable. Her medications were verified and her pillbox was refilled.   Michelle Andrade, EMT 12/17/18  ACTION: Home visit completed Next visit planned for 2 weeks

## 2018-12-18 ENCOUNTER — Telehealth (HOSPITAL_COMMUNITY): Payer: Self-pay | Admitting: Licensed Clinical Social Worker

## 2018-12-18 NOTE — Telephone Encounter (Signed)
CSW reached out to pt to check in regarding food and medication status at this time. Pt reports that she has everything that she needs at this time.  Pt has been getting food from the [employer] program and has been really enjoying it.  States it has made it so much easier not to worry about getting food during this time or having to prepare food- has made quarantining a lot more pleasant for her.  CSW encouraged pt to reach out with any concerns and will continue to follow and assist as needed  Jorge Ny, [place] Worker [facility] [phone]

## 2018-12-18 NOTE — Telephone Encounter (Signed)
CSW reached out to pt to check in regarding food and medication status at this time. Pt reports that she has everything at this time.  Pt getting food through [employer] and has been enjoying the meals and all the fresh produce that comes with it- states it has made things so much easier with the quarantine not to have to worry about groceries or preparing food.  CSW encouraged pt to reach out with any concerns and will continue to follow and assist as needed  Jorge Ny, [place] Worker [facility] [phone]

## 2018-12-23 ENCOUNTER — Other Ambulatory Visit (HOSPITAL_COMMUNITY): Payer: Self-pay

## 2018-12-23 NOTE — Progress Notes (Signed)
Michelle Andrade was seen at home today and reported feeling well. The purpose of this visit was to restock her pillbox which she had dropped over the weekend. Her medications were organized and her pillboxes were refilled.

## 2018-12-24 ENCOUNTER — Telehealth (HOSPITAL_COMMUNITY): Payer: Self-pay | Admitting: Licensed Clinical Social Worker

## 2018-12-24 NOTE — Telephone Encounter (Signed)
CSW reached out to pt to check in after hearing about the loss of a close family friend.  Pt reports she is doing ok at this time and just trying to stay strong for her best friend whose mom had passed away from cancer.  CSW provided active listening and support to patient- CSW encouraged pt to reach out if she is struggling or has any other concerns.  CSW will continue to follow and assist as needed  Jorge Ny, LCSW Clinical Social Worker [facility] [phone]

## 2019-01-01 ENCOUNTER — Telehealth: Payer: Self-pay | Admitting: *Deleted

## 2019-01-01 NOTE — Telephone Encounter (Signed)
Subject contacted for W96 in the [facility] HF trial.  Doing really well and upbeat.  No cos, aes or saes to report to sponsor. Surveys done via phone and verbal consent obtained to deliver medication on [id number].  Instructed not to take any IP until receives phone call from [place].  Verbalized understanding.

## 2019-01-02 ENCOUNTER — Telehealth: Payer: Self-pay | Admitting: *Deleted

## 2019-01-02 NOTE — Telephone Encounter (Signed)
Follow up phone call with patient to verify that meds were delivered in an acceptable condition for use.  Subject stated everything was good and for [place] to stay safe. Told subject to start using delivered drug, save all boxes and unused for return at next clinic visit.

## 2019-01-07 ENCOUNTER — Other Ambulatory Visit (HOSPITAL_COMMUNITY): Payer: Self-pay

## 2019-01-07 ENCOUNTER — Telehealth (HOSPITAL_COMMUNITY): Payer: Self-pay | Admitting: Licensed Clinical Social Worker

## 2019-01-07 ENCOUNTER — Telehealth (HOSPITAL_COMMUNITY): Payer: Self-pay

## 2019-01-07 NOTE — Telephone Encounter (Signed)
CSW reached out to pt to check in regarding food and medication status at this time. Pt reports she is doing well at this time.  Was able to see her best friend last week after her friend lost her mother to cancer and is happy she could be there for her despite the COVID-19 restrictions.  She also reports some issues with a new gout medication but states she has an app[facility] on Monday with [facility] to discuss changing med due to unpleasant side effects.  Overall pt reports doing well and living about as she was prior to quarantine restrictions.  CSW encouraged pt to reach out with any concerns and will continue to follow and assist as needed  Jorge Ny, [place] Worker [facility] [phone]

## 2019-01-07 NOTE — Progress Notes (Signed)
Paramedicine Encounter    Patient ID: Michelle Andrade, female    DOB: 1961-01-31, 58 y.o.   MRN: [id number]   Patient Care Team: Henderson [place], [name] as PCP - General (Nurse Practitioner) Jorge Ny, LCSW as Social Worker (Licensed Clinical Social Worker)  Patient Active Problem List   Diagnosis Date Noted  . Secondary hyperparathyroidism of renal origin ([place]) 03/20/2018  . Severe obesity (BMI 35.0-39.9) with comorbidity ([place]) 12/19/2017  . CKD (chronic kidney disease) stage 4, GFR 15-29 ml/min (HCC) 06/07/2017  . Acute on chronic systolic (congestive) heart failure ([place])   . CHF (congestive heart failure) ([place]) 02/09/2017  . Pulmonary vascular congestion 02/09/2017  . Hypertension   . Sleep apnea   . Chondrocalcinosis of both knees 02/08/2017  . Primary osteoarthritis of both knees 02/08/2017  . Type 2 diabetes mellitus ([employer]) 11/16/2016  . Borderline personality disorder ([place]) 10/17/2016  . Carpal tunnel syndrome 10/17/2016  . 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 Eczema 07/03/2016  . Atrial tachycardia ([place]) 05/31/2016  . 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 Onychomycosis of toenail 06/08/2015  . Cardiomyopathy ([place]) 06/07/2015  . Irritable bowel syndrome 09/27/2014  . PAD (peripheral artery disease) ([place]) 01/12/2014  . History of DVT (deep vein thrombosis) 01/12/2014  . Acute gout of right knee 12/29/2013  . Iron deficiency anemia 12/29/2013  . Lichenification and lichen simplex chronicus 12/26/2013  . Hyperlipidemia associated with type 2 diabetes mellitus ([place]) 11/10/2013  . Benign hypertension with chronic kidney disease, stage IV ([place]) 11/10/2013  . Disorder of magnesium metabolism 10/13/2013  . CAD in native artery 09/01/2013  . Anxiety state 07/27/2013  . Depressive disorder 07/27/2013  . Gastro-esophageal reflux disease without esophagitis 07/27/2013  . Low back  pain 06/03/2013  . Mitral valve disease 06/03/2013  . Pulmonary valve disorder 06/03/2013  . Tricuspid valve disorder 06/03/2013  . 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 Allergic rhinitis 02/07/2013  . 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 Pain in joint 02/07/2013    Current Outpatient Medications:  .  amiodarone (PACERONE) 200 MG tablet, Take 200 mg by mouth daily., Disp: , Rfl:  .  aspirin 81 MG chewable tablet, Chew 81 mg by mouth daily., Disp: , Rfl:  .  Calcium Carbonate-Vitamin D 600-400 MG-UNIT tablet, Take by mouth., Disp: , Rfl:  .  Calcium Carbonate-Vitamin D3 (CALCIUM 600/VITAMIN D) 600-400 MG-UNIT TABS, Take 1 tablet by mouth 2 (two) times daily. , Disp: , Rfl:  .  carvedilol (COREG) 25 MG tablet, Take 12.5 mg by mouth 2 (two) times daily with a meal., Disp: , Rfl:  .  colchicine 0.6 MG tablet, Take 2 tablets now and 1 an hour later, Disp: , Rfl:  .  diclofenac sodium (VOLTAREN) 1 % GEL, Apply 2 g topically 4 (four) times daily., Disp: 100 g, Rfl: 0 .  ferrous sulfate 325 (65 FE) MG tablet, Take 325 mg by mouth 3 (three) times daily. , Disp: , Rfl:  .  glipiZIDE (GLUCOTROL) 5 MG tablet, Take 2.5 mg by mouth daily before breakfast., Disp: , Rfl:  .  hydrALAZINE (APRESOLINE) 50 MG tablet, Take 1.5 tablets (75 mg total) by mouth 3 (three) times daily., Disp: 405 tablet, Rfl: 3 .  Investigational - Study Medication, Take 1 tablet by mouth 2 (two) times daily. Study name: Galactic Heart Failure Study Additional study details: Omecamtiv Mecarbil or Placebo, Disp: 1 each, Rfl: PRN .  isosorbide mononitrate (IMDUR) 60 MG 24  hr tablet, Take 1 tablet (60 mg total) by mouth daily., Disp: 30 tablet, Rfl: 0 .  loratadine (CLARITIN) 10 MG tablet, Take 10 mg by mouth daily., Disp: , Rfl:  .  Magnesium Oxide 420 MG TABS, Take 840 mg by mouth daily., Disp: , Rfl:  .  metolazone (ZAROXOLYN) 2.5 MG tablet, Take 1 tablet (2.5 mg total) by mouth once a week. Monday, Disp: 12 tablet, Rfl: 0 .   Multiple Vitamin (MULTIVITAMIN) tablet, Take 1 tablet by mouth daily., Disp: , Rfl:  .  omeprazole (PRILOSEC) 40 MG capsule, Take 40 mg by mouth daily., Disp: , Rfl:  .  potassium chloride (K-DUR,KLOR-CON) 10 MEQ tablet, Take 4 tablets (40 mEq total) by mouth 2 (two) times daily., Disp: 240 tablet, Rfl: 11 .  rOPINIRole (REQUIP) 0.5 MG tablet, Take 0.5 mg by mouth at bedtime. , Disp: , Rfl:  .  sertraline (ZOLOFT) 100 MG tablet, Take 150 mg by mouth daily., Disp: , Rfl:  .  simvastatin (ZOCOR) 20 MG tablet, Take 20 mg by mouth daily., Disp: , Rfl:  .  spironolactone (ALDACTONE) 25 MG tablet, Take 1 tablet (25 mg total) by mouth daily., Disp: 90 tablet, Rfl: 3 .  torsemide (DEMADEX) 20 MG tablet, Take 1 tablet (20 mg total) by mouth 2 (two) times daily., Disp: 120 tablet, Rfl: 3 Allergies  Allergen Reactions  . Codeine Itching  . Gabapentin Itching  . Haldol [Haloperidol Lactate] Other (See Comments)    Dizziness, hallucinations  . Levofloxacin     Other reaction(s): Other (See Comments) Projectile vomiting   . Lisinopril Itching and Swelling  . Losartan Itching  . Morphine And Related Nausea And Vomiting  . Penicillins Nausea And Vomiting    "Vomiting, upset stomach, Headache" Has patient had a PCN reaction causing immediate rash, facial/tongue/throat swelling, SOB or lightheadedness with hypotension: No Has patient had a PCN reaction causing severe rash involving mucus membranes or skin necrosis: No Has patient had a PCN reaction that required hospitalization: No Has patient had a PCN reaction occurring within the last 10 years: No If all of the above answers are "NO", then may proceed with Cephalosporin use.       Social History   Socioeconomic History  . Marital status: Single    Spouse name: Not on file  . Number of children: Not on file  . Years of education: Not on file  . Highest education level: Not on file  Occupational History  . Not on file  Social Needs  .  Financial resource strain: Not on file  . Food insecurity:    Worry: Not on file    Inability: Not on file  . Transportation needs:    Medical: Not on file    Non-medical: Not on file  Tobacco Use  . Smoking status: Never Smoker  . Smokeless tobacco: Never Used  Substance and Sexual Activity  . Alcohol use: No  . Drug use: No  . Sexual activity: Not on file  Lifestyle  . Physical activity:    Days per week: Not on file    Minutes per session: Not on file  . Stress: Not on file  Relationships  . Social connections:    Talks on phone: Not on file    Gets together: Not on file    Attends religious service: Not on file    Active member of club or organization: Not on file    Attends meetings of clubs or organizations: Not on file  Relationship status: Not on file  . Intimate partner violence:    Fear of current or ex partner: Not on file    Emotionally abused: Not on file    Physically abused: Not on file    Forced sexual activity: Not on file  Other Topics Concern  . Not on file  Social History Narrative  . Not on file    Physical Exam Cardiovascular:     Rate and Rhythm: Normal rate and regular rhythm.     Pulses: Normal pulses.  Pulmonary:     Effort: Pulmonary effort is normal.     Breath sounds: Normal breath sounds.  Musculoskeletal: Normal range of motion.     Right lower leg: No edema.     Left lower leg: No edema.  Skin:    General: Skin is warm and dry.     Capillary Refill: Capillary refill takes less than 2 seconds.  Neurological:     Mental Status: She is alert and oriented to person, place, and time.  Psychiatric:        Mood and Affect: Mood normal.         No future appointments.  BP 115/69 (BP Location: Right Arm, Patient Position: Sitting, Cuff Size: Normal)   Pulse 82   Resp 16   Wt 188 lb 3.2 oz (85.4 kg)   SpO2 97%   BMI 33.34 kg/m   Weight yesterday- did not weigh Last visit weight- 188 lb  Ms Myszka was seen at home today  and reported feeling well. She denied chest pain, SOB, headache, increased dizziness, orthopnea, cough or fever since our last visit. She stated she had been compliant with her medications however she had dropped her pillbox again and there were several pills remaining in what should have been an empty box. She said she has been leaving the boxes on a shelf near where she sits and they have been getting knocked over when she gets up but she said she would change where she keeps them to avoid this moving forward. Her medications were verified and her pillbox was refilled. I will follow up in two weeks.   [name], EMT 01/07/19  ACTION: Home visit completed Next visit planned for 2 weeks

## 2019-01-07 NOTE — Telephone Encounter (Signed)
I called Michelle Andrade to schedule an appointment. She stated she would be available all day today and could meet at any time. I suggested we meet at 09:30 and she was agreeable.

## 2019-01-16 ENCOUNTER — Telehealth (HOSPITAL_COMMUNITY): Payer: Self-pay

## 2019-01-16 NOTE — Telephone Encounter (Signed)
Ms Michelle Andrade called me to request that her meal deliveries be stopped. She stated she was unhappy eating "the same food every week" and did not want the deliveries anymore. I will pass this along to Tammy Sours, LCSW.

## 2019-01-19 ENCOUNTER — Telehealth (HOSPITAL_COMMUNITY): Payer: Self-pay | Admitting: Licensed Clinical Social Worker

## 2019-01-19 NOTE — Telephone Encounter (Signed)
CSW informed by community paramedic that pt no longer wishes to receive [employer] deliveries.  CSW called pt to discuss.  Pt reports she has been enjoying the meals but that it became to repetitive after awhile and she is no longer eating the meals.  CSW discussed ability to call and have menu changed each week and pt acknowledged that she is aware of this but that there are still a very limited number of low sodium options so the meals were still too repetitive.  CSW called [employer] and suspended pt account.  CSW will continue to follow and assist as needed  Jorge Ny, [facility] Desk#: [phone] Cell#: [phone]

## 2019-01-20 ENCOUNTER — Telehealth (HOSPITAL_COMMUNITY): Payer: Self-pay

## 2019-01-20 NOTE — Telephone Encounter (Signed)
I called Ms Michelle Andrade to schedule an appointment. She stated she would be available all day so we agreed to meet in the morning between 11:00 and 11:30.

## 2019-01-21 ENCOUNTER — Other Ambulatory Visit (HOSPITAL_COMMUNITY): Payer: Self-pay

## 2019-01-21 NOTE — Progress Notes (Signed)
Paramedicine Encounter    Patient ID: Michelle Andrade, female    DOB: [birth year], 58 y.o.   MRN: [id number]   Patient Care Team: Henderson Baltimore, [name] as PCP - General (Nurse Practitioner) Jorge Ny, LCSW as Social Worker (Licensed Clinical Social Worker)  Patient Active Problem List   Diagnosis Date Noted  . Secondary hyperparathyroidism of renal origin ([place]) 03/20/2018  . Severe obesity (BMI 35.0-39.9) with comorbidity ([place]) 12/19/2017  . CKD (chronic kidney disease) stage 4, GFR 15-29 ml/min (HCC) 06/07/2017  . Acute on chronic systolic (congestive) heart failure ([place])   . CHF (congestive heart failure) ([place]) 02/09/2017  . Pulmonary vascular congestion 02/09/2017  . Hypertension   . Sleep apnea   . Chondrocalcinosis of both knees 02/08/2017  . Primary osteoarthritis of both knees 02/08/2017  . Type 2 diabetes mellitus ([place]) 11/16/2016  . Borderline personality disorder ([place]) 10/17/2016  . Carpal tunnel syndrome 10/17/2016  . 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 Eczema 07/03/2016  . Atrial tachycardia ([place]) 05/31/2016  . 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 Onychomycosis of toenail 06/08/2015  . Cardiomyopathy ([place]) 06/07/2015  . Irritable bowel syndrome 09/27/2014  . PAD (peripheral artery disease) ([place]) 01/12/2014  . History of DVT (deep vein thrombosis) 01/12/2014  . Acute gout of right knee 12/29/2013  . Iron deficiency anemia 12/29/2013  . Lichenification and lichen simplex chronicus 12/26/2013  . Hyperlipidemia associated with type 2 diabetes mellitus ([place]) 11/10/2013  . Benign hypertension with chronic kidney disease, stage IV ([place]) 11/10/2013  . Disorder of magnesium metabolism 10/13/2013  . CAD in native artery 09/01/2013  . Anxiety state 07/27/2013  . Depressive disorder 07/27/2013  . Gastro-esophageal reflux disease without esophagitis 07/27/2013  . Low back  pain 06/03/2013  . Mitral valve disease 06/03/2013  . Pulmonary valve disorder 06/03/2013  . Tricuspid valve disorder 06/03/2013  . 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 Allergic rhinitis 02/07/2013  . 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 Pain in joint 02/07/2013    Current Outpatient Medications:  .  amiodarone (PACERONE) 200 MG tablet, Take 200 mg by mouth daily., Disp: , Rfl:  .  aspirin 81 MG chewable tablet, Chew 81 mg by mouth daily., Disp: , Rfl:  .  Calcium Carbonate-Vitamin D3 (CALCIUM 600/VITAMIN D) 600-400 MG-UNIT TABS, Take 1 tablet by mouth 2 (two) times daily. , Disp: , Rfl:  .  carvedilol (COREG) 25 MG tablet, Take 12.5 mg by mouth 2 (two) times daily with a meal., Disp: , Rfl:  .  colchicine 0.6 MG tablet, Take 2 tablets now and 1 an hour later, Disp: , Rfl:  .  diclofenac sodium (VOLTAREN) 1 % GEL, Apply 2 g topically 4 (four) times daily., Disp: 100 g, Rfl: 0 .  ferrous sulfate 325 (65 FE) MG tablet, Take 325 mg by mouth 3 (three) times daily. , Disp: , Rfl:  .  glipiZIDE (GLUCOTROL) 5 MG tablet, Take 2.5 mg by mouth daily before breakfast., Disp: , Rfl:  .  hydrALAZINE (APRESOLINE) 50 MG tablet, Take 1.5 tablets (75 mg total) by mouth 3 (three) times daily., Disp: 405 tablet, Rfl: 3 .  Investigational - Study Medication, Take 1 tablet by mouth 2 (two) times daily. Study name: Galactic Heart Failure Study Additional study details: Omecamtiv Mecarbil or Placebo, Disp: 1 each, Rfl: PRN .  isosorbide mononitrate (IMDUR) 60 MG 24 hr tablet, Take 1 tablet (60 mg total) by mouth daily., Disp: 30 tablet, Rfl:  0 .  loratadine (CLARITIN) 10 MG tablet, Take 10 mg by mouth daily., Disp: , Rfl:  .  Magnesium Oxide 420 MG TABS, Take 840 mg by mouth daily., Disp: , Rfl:  .  metolazone (ZAROXOLYN) 2.5 MG tablet, Take 1 tablet (2.5 mg total) by mouth once a week. Monday, Disp: 12 tablet, Rfl: 0 .  Multiple Vitamin (MULTIVITAMIN) tablet, Take 1 tablet by mouth daily., Disp: , Rfl:  .   omeprazole (PRILOSEC) 40 MG capsule, Take 40 mg by mouth daily., Disp: , Rfl:  .  potassium chloride (K-DUR,KLOR-CON) 10 MEQ tablet, Take 4 tablets (40 mEq total) by mouth 2 (two) times daily., Disp: 240 tablet, Rfl: 11 .  rOPINIRole (REQUIP) 0.5 MG tablet, Take 0.5 mg by mouth at bedtime. , Disp: , Rfl:  .  sertraline (ZOLOFT) 100 MG tablet, Take 150 mg by mouth daily., Disp: , Rfl:  .  simvastatin (ZOCOR) 20 MG tablet, Take 20 mg by mouth daily., Disp: , Rfl:  .  spironolactone (ALDACTONE) 25 MG tablet, Take 1 tablet (25 mg total) by mouth daily., Disp: 90 tablet, Rfl: 3 .  torsemide (DEMADEX) 20 MG tablet, Take 1 tablet (20 mg total) by mouth 2 (two) times daily., Disp: 120 tablet, Rfl: 3 .  Calcium Carbonate-Vitamin D 600-400 MG-UNIT tablet, Take by mouth., Disp: , Rfl:  Allergies  Allergen Reactions  . Codeine Itching  . Gabapentin Itching  . Haldol [Haloperidol Lactate] Other (See Comments)    Dizziness, hallucinations  . Levofloxacin     Other reaction(s): Other (See Comments) Projectile vomiting   . Lisinopril Itching and Swelling  . Losartan Itching  . Morphine And Related Nausea And Vomiting  . Penicillins Nausea And Vomiting    "Vomiting, upset stomach, Headache" Has patient had a PCN reaction causing immediate rash, facial/tongue/throat swelling, SOB or lightheadedness with hypotension: No Has patient had a PCN reaction causing severe rash involving mucus membranes or skin necrosis: No Has patient had a PCN reaction that required hospitalization: No Has patient had a PCN reaction occurring within the last 10 years: No If all of the above answers are "NO", then may proceed with Cephalosporin use.       Social History   Socioeconomic History  . Marital status: Single    Spouse name: Not on file  . Number of children: Not on file  . Years of education: Not on file  . Highest education level: Not on file  Occupational History  . Not on file  Social Needs  .  Financial resource strain: Not on file  . Food insecurity:    Worry: Not on file    Inability: Not on file  . Transportation needs:    Medical: Not on file    Non-medical: Not on file  Tobacco Use  . Smoking status: Never Smoker  . Smokeless tobacco: Never Used  Substance and Sexual Activity  . Alcohol use: No  . Drug use: No  . Sexual activity: Not on file  Lifestyle  . Physical activity:    Days per week: Not on file    Minutes per session: Not on file  . Stress: Not on file  Relationships  . Social connections:    Talks on phone: Not on file    Gets together: Not on file    Attends religious service: Not on file    Active member of club or organization: Not on file    Attends meetings of clubs or organizations: Not on file  Relationship status: Not on file  . Intimate partner violence:    Fear of current or ex partner: Not on file    Emotionally abused: Not on file    Physically abused: Not on file    Forced sexual activity: Not on file  Other Topics Concern  . Not on file  Social History Narrative  . Not on file    Physical Exam Cardiovascular:     Rate and Rhythm: Normal rate and regular rhythm.     Pulses: Normal pulses.  Pulmonary:     Effort: Pulmonary effort is normal.     Breath sounds: Normal breath sounds.  Abdominal:     General: There is no distension.  Musculoskeletal: Normal range of motion.     Right lower leg: No edema.     Left lower leg: No edema.  Skin:    General: Skin is warm and dry.     Capillary Refill: Capillary refill takes less than 2 seconds.  Neurological:     Mental Status: Michelle Andrade is alert and oriented to person, place, and time.  Psychiatric:        Mood and Affect: Mood normal.         No future appointments.  BP (!) 99/56 (BP Location: Left Arm, Patient Position: Sitting, Cuff Size: Normal)   Pulse 86   Resp 18   Wt 191 lb 3.2 oz (86.7 kg)   SpO2 98%   BMI 33.87 kg/m   Weight yesterday- Did not weigh Last  visit weight- 188.2 lb  Michelle Andrade was seen at home today and reported feeling well. Michelle Andrade denied chest pain, SOB, headache, dizziness, orthopnea, cough or fever since our last visit. Michelle Andrade reported being compliant with her medications over the past two weeks and her weight has been relatively stable. Her medications were verified and her pillbox was refilled.   Michelle Andrade, EMT 01/21/19  ACTION: Home visit completed Next visit planned for 2 weeks

## 2019-01-27 ENCOUNTER — Telehealth (HOSPITAL_COMMUNITY): Payer: Self-pay | Admitting: Licensed Clinical Social Worker

## 2019-01-27 NOTE — Telephone Encounter (Signed)
CSW reached out to pt to check in regarding food and medication status at this time. Pt reports having everything she needs at this time despite stopping the [employer] program.  Pt is doing well- today is her birthday and she has been able to get out and get her nails done and speak with friends so she is having a great day.  CSW encouraged pt to reach out with any concerns and will continue to follow and assist as needed  Jorge Ny, [place] Worker [facility] [phone]

## 2019-02-03 ENCOUNTER — Telehealth (HOSPITAL_COMMUNITY): Payer: Self-pay

## 2019-02-03 ENCOUNTER — Other Ambulatory Visit (HOSPITAL_COMMUNITY): Payer: Self-pay

## 2019-02-03 NOTE — Telephone Encounter (Signed)
I called Michelle Andrade to schedule an appointment. She stated she would be available all day tomorrow and agreed to meet with me at 09:00.

## 2019-02-03 NOTE — Progress Notes (Signed)
Opened in error

## 2019-02-04 ENCOUNTER — Other Ambulatory Visit (HOSPITAL_COMMUNITY): Payer: Self-pay

## 2019-02-04 NOTE — Progress Notes (Signed)
Paramedicine Encounter    Patient ID: Michelle Andrade, female    DOB: Jun 30, 1961, 58 y.o.   MRN: [id number]   Patient Care Team: Henderson Baltimore, [name] as PCP - General (Nurse Practitioner) Jorge Ny, LCSW as Social Worker (Licensed Clinical Social Worker)  Patient Active Problem List   Diagnosis Date Noted  . Secondary hyperparathyroidism of renal origin ([place]) 03/20/2018  . Severe obesity (BMI 35.0-39.9) with comorbidity ([place]) 12/19/2017  . CKD (chronic kidney disease) stage 4, GFR 15-29 ml/min (HCC) 06/07/2017  . Acute on chronic systolic (congestive) heart failure ([place])   . CHF (congestive heart failure) ([place]) 02/09/2017  . Pulmonary vascular congestion 02/09/2017  . Hypertension   . Sleep apnea   . Chondrocalcinosis of both knees 02/08/2017  . Primary osteoarthritis of both knees 02/08/2017  . Type 2 diabetes mellitus ([place]) 11/16/2016  . Borderline personality disorder ([place]) 10/17/2016  . Carpal tunnel syndrome 10/17/2016  . 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 Eczema 07/03/2016  . Atrial tachycardia ([place]) 05/31/2016  . 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 Onychomycosis of toenail 06/08/2015  . Cardiomyopathy ([place]) 06/07/2015  . Irritable bowel syndrome 09/27/2014  . PAD (peripheral artery disease) ([place]) 01/12/2014  . History of DVT (deep vein thrombosis) 01/12/2014  . Acute gout of right knee 12/29/2013  . Iron deficiency anemia 12/29/2013  . Lichenification and lichen simplex chronicus 12/26/2013  . Hyperlipidemia associated with type 2 diabetes mellitus ([place]) 11/10/2013  . Benign hypertension with chronic kidney disease, stage IV ([place]) 11/10/2013  . Disorder of magnesium metabolism 10/13/2013  . CAD in native artery 09/01/2013  . Anxiety state 07/27/2013  . Depressive disorder 07/27/2013  . Gastro-esophageal reflux disease without esophagitis 07/27/2013  . Low back  pain 06/03/2013  . Mitral valve disease 06/03/2013  . Pulmonary valve disorder 06/03/2013  . Tricuspid valve disorder 06/03/2013  . 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 Allergic rhinitis 02/07/2013  . 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 Pain in joint 02/07/2013    Current Outpatient Medications:  .  amiodarone (PACERONE) 200 MG tablet, Take 200 mg by mouth daily., Disp: , Rfl:  .  aspirin 81 MG chewable tablet, Chew 81 mg by mouth daily., Disp: , Rfl:  .  Calcium Carbonate-Vitamin D3 (CALCIUM 600/VITAMIN D) 600-400 MG-UNIT TABS, Take 1 tablet by mouth 2 (two) times daily. , Disp: , Rfl:  .  carvedilol (COREG) 25 MG tablet, Take 12.5 mg by mouth 2 (two) times daily with a meal., Disp: , Rfl:  .  colchicine 0.6 MG tablet, Take 2 tablets now and 1 an hour later, Disp: , Rfl:  .  diclofenac sodium (VOLTAREN) 1 % GEL, Apply 2 g topically 4 (four) times daily., Disp: 100 g, Rfl: 0 .  ferrous sulfate 325 (65 FE) MG tablet, Take 325 mg by mouth 3 (three) times daily. , Disp: , Rfl:  .  glipiZIDE (GLUCOTROL) 5 MG tablet, Take 2.5 mg by mouth daily before breakfast., Disp: , Rfl:  .  hydrALAZINE (APRESOLINE) 50 MG tablet, Take 1.5 tablets (75 mg total) by mouth 3 (three) times daily., Disp: 405 tablet, Rfl: 3 .  Investigational - Study Medication, Take 1 tablet by mouth 2 (two) times daily. Study name: Galactic Heart Failure Study Additional study details: Omecamtiv Mecarbil or Placebo, Disp: 1 each, Rfl: PRN .  isosorbide mononitrate (IMDUR) 60 MG 24 hr tablet, Take 1 tablet (60 mg total) by mouth daily., Disp: 30 tablet, Rfl:  0 .  loratadine (CLARITIN) 10 MG tablet, Take 10 mg by mouth daily., Disp: , Rfl:  .  Magnesium Oxide 420 MG TABS, Take 840 mg by mouth daily., Disp: , Rfl:  .  metolazone (ZAROXOLYN) 2.5 MG tablet, Take 1 tablet (2.5 mg total) by mouth once a week. Monday, Disp: 12 tablet, Rfl: 0 .  Multiple Vitamin (MULTIVITAMIN) tablet, Take 1 tablet by mouth daily., Disp: , Rfl:  .   omeprazole (PRILOSEC) 40 MG capsule, Take 40 mg by mouth daily., Disp: , Rfl:  .  potassium chloride (K-DUR,KLOR-CON) 10 MEQ tablet, Take 4 tablets (40 mEq total) by mouth 2 (two) times daily., Disp: 240 tablet, Rfl: 11 .  rOPINIRole (REQUIP) 0.5 MG tablet, Take 0.5 mg by mouth at bedtime. , Disp: , Rfl:  .  sertraline (ZOLOFT) 100 MG tablet, Take 150 mg by mouth daily., Disp: , Rfl:  .  simvastatin (ZOCOR) 20 MG tablet, Take 20 mg by mouth daily., Disp: , Rfl:  .  spironolactone (ALDACTONE) 25 MG tablet, Take 1 tablet (25 mg total) by mouth daily., Disp: 90 tablet, Rfl: 3 .  torsemide (DEMADEX) 20 MG tablet, Take 1 tablet (20 mg total) by mouth 2 (two) times daily., Disp: 120 tablet, Rfl: 3 .  Calcium Carbonate-Vitamin D 600-400 MG-UNIT tablet, Take by mouth., Disp: , Rfl:  Allergies  Allergen Reactions  . Codeine Itching  . Gabapentin Itching  . Haldol [Haloperidol Lactate] Other (See Comments)    Dizziness, hallucinations  . Levofloxacin     Other reaction(s): Other (See Comments) Projectile vomiting   . Lisinopril Itching and Swelling  . Losartan Itching  . Morphine And Related Nausea And Vomiting  . Penicillins Nausea And Vomiting    "Vomiting, upset stomach, Headache" Has patient had a PCN reaction causing immediate rash, facial/tongue/throat swelling, SOB or lightheadedness with hypotension: No Has patient had a PCN reaction causing severe rash involving mucus membranes or skin necrosis: No Has patient had a PCN reaction that required hospitalization: No Has patient had a PCN reaction occurring within the last 10 years: No If all of the above answers are "NO", then may proceed with Cephalosporin use.       Social History   Socioeconomic History  . Marital status: Single    Spouse name: Not on file  . Number of children: Not on file  . Years of education: Not on file  . Highest education level: Not on file  Occupational History  . Not on file  Social Needs  .  Financial resource strain: Not on file  . Food insecurity:    Worry: Not on file    Inability: Not on file  . Transportation needs:    Medical: Not on file    Non-medical: Not on file  Tobacco Use  . Smoking status: Never Smoker  . Smokeless tobacco: Never Used  Substance and Sexual Activity  . Alcohol use: No  . Drug use: No  . Sexual activity: Not on file  Lifestyle  . Physical activity:    Days per week: Not on file    Minutes per session: Not on file  . Stress: Not on file  Relationships  . Social connections:    Talks on phone: Not on file    Gets together: Not on file    Attends religious service: Not on file    Active member of club or organization: Not on file    Attends meetings of clubs or organizations: Not on file  Relationship status: Not on file  . Intimate partner violence:    Fear of current or ex partner: Not on file    Emotionally abused: Not on file    Physically abused: Not on file    Forced sexual activity: Not on file  Other Topics Concern  . Not on file  Social History Narrative  . Not on file    Physical Exam Cardiovascular:     Rate and Rhythm: Normal rate and regular rhythm.     Pulses: Normal pulses.  Pulmonary:     Effort: Pulmonary effort is normal.     Breath sounds: Normal breath sounds.  Musculoskeletal: Normal range of motion.     Right lower leg: No edema.     Left lower leg: No edema.  Skin:    General: Skin is warm and dry.     Capillary Refill: Capillary refill takes less than 2 seconds.  Neurological:     Mental Status: She is alert and oriented to person, place, and time.  Psychiatric:        Mood and Affect: Mood normal.         No future appointments.  BP 106/65 (BP Location: Left Arm, Patient Position: Sitting, Cuff Size: Normal)   Pulse 80   Resp 18   Wt 190 lb (86.2 kg)   SpO2 97%   BMI 33.66 kg/m   Weight yesterday- 193 lb Last visit weight- 191.2 lb  Ms Cederberg was seen at home today and reported  feeling well. She denied chest pain, SOB, headache, increased dizziness/orthopnea, cough or fever since our last visit. She stated she has been compliant with her medications over the past two weeks and her weight has been stable. She was seen by her PCP recently and advised that her labs were abnormal. Upon looking at her online records from the PCP, I noted her BUN to be 57, Creatinine to be 2.20 and her GFR was 27. As a result of this, she has an upcoming appointment with her nephrologist. I will advise the HF clinic providers about these recent labs and see if they want to make any changes and follow up with Ms [place]alla when I have more information.    [name], EMT 02/04/19  ACTION: Home visit completed Next visit planned for 2 weeks

## 2019-02-16 IMAGING — CR DG CHEST 2V
2 series · 2 of 2 positions shown · non-contrast
Comparison: 02/09/2017 and prior exams

CLINICAL DATA: Shortness of breath.

EXAM:
CHEST  2 VIEW

[chest pa]
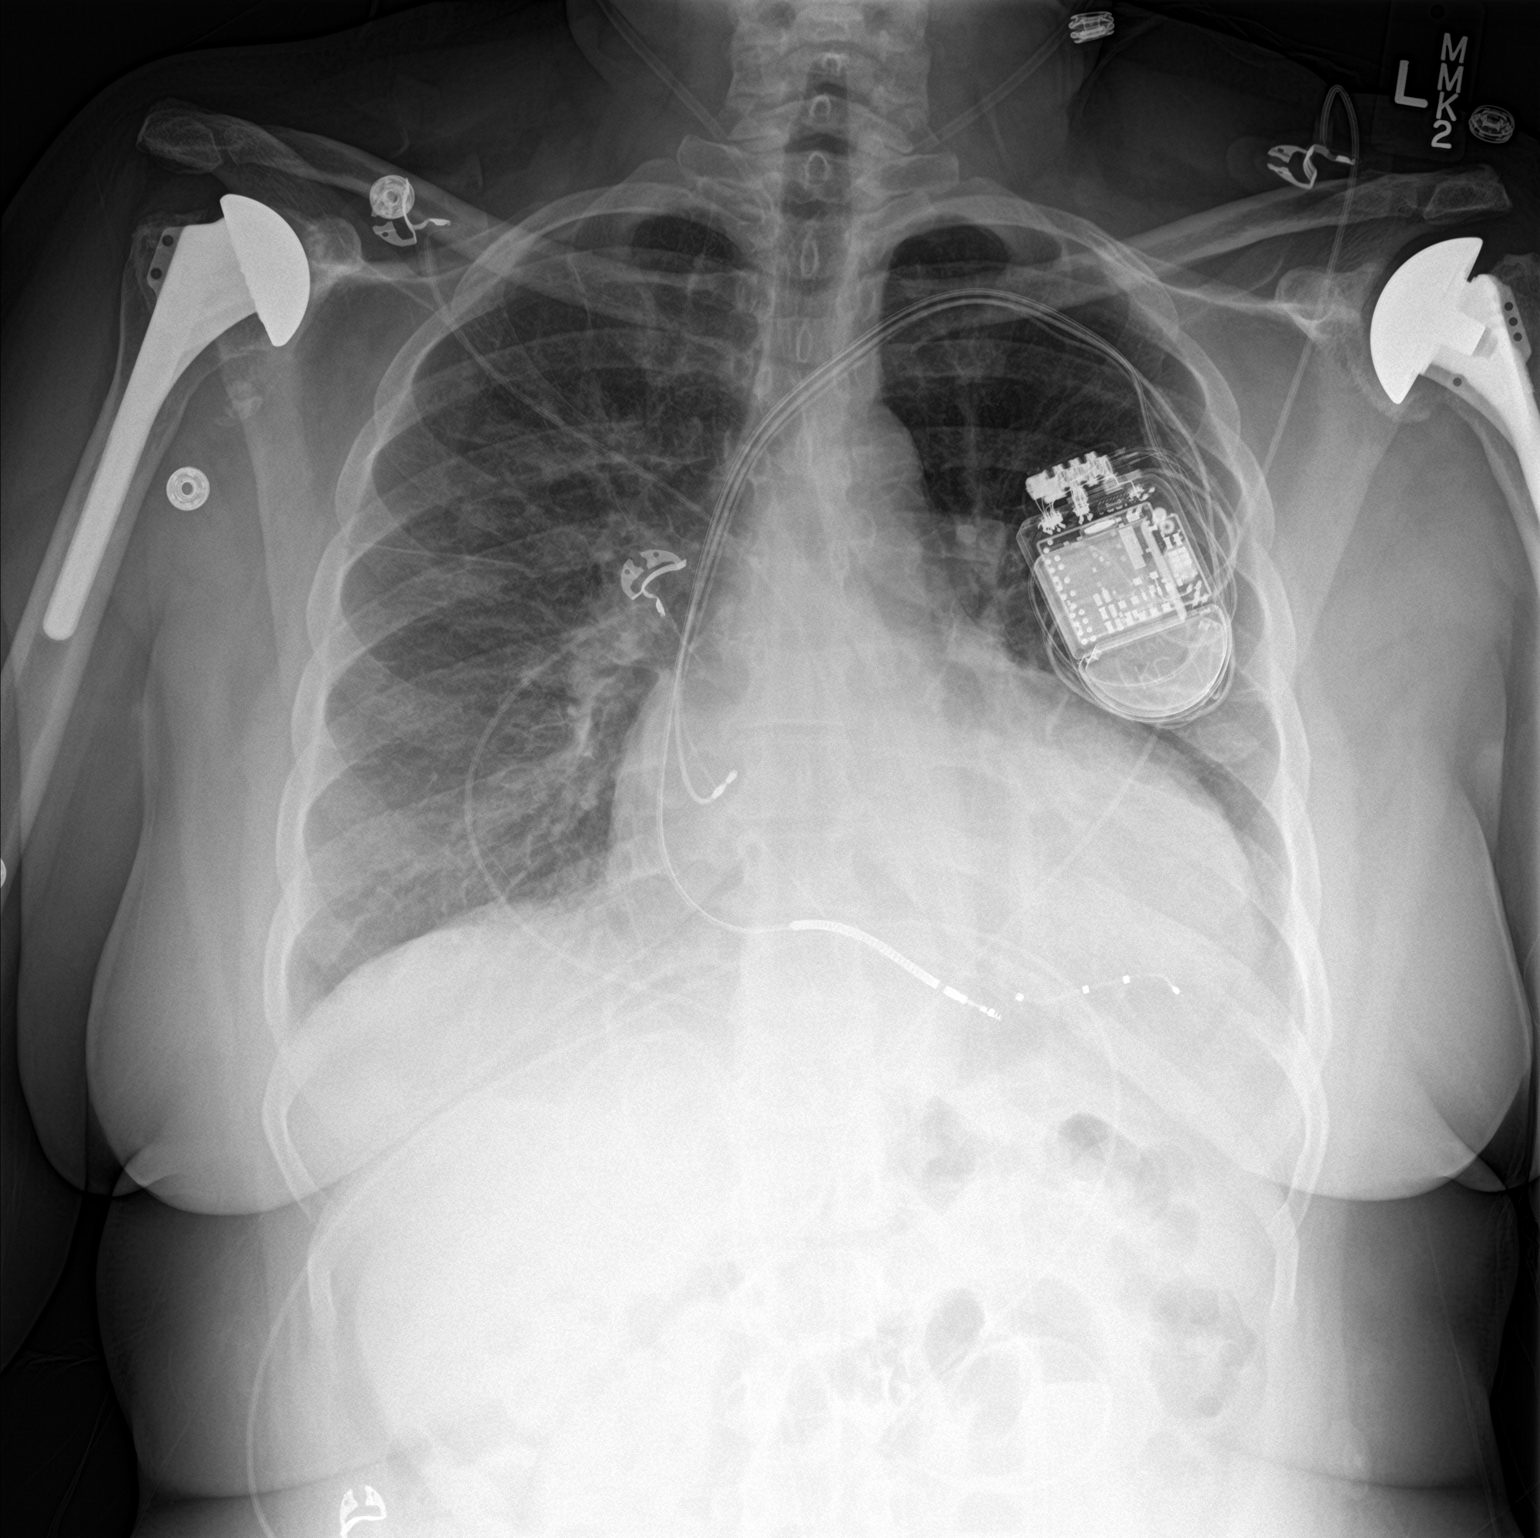

[chest lat]
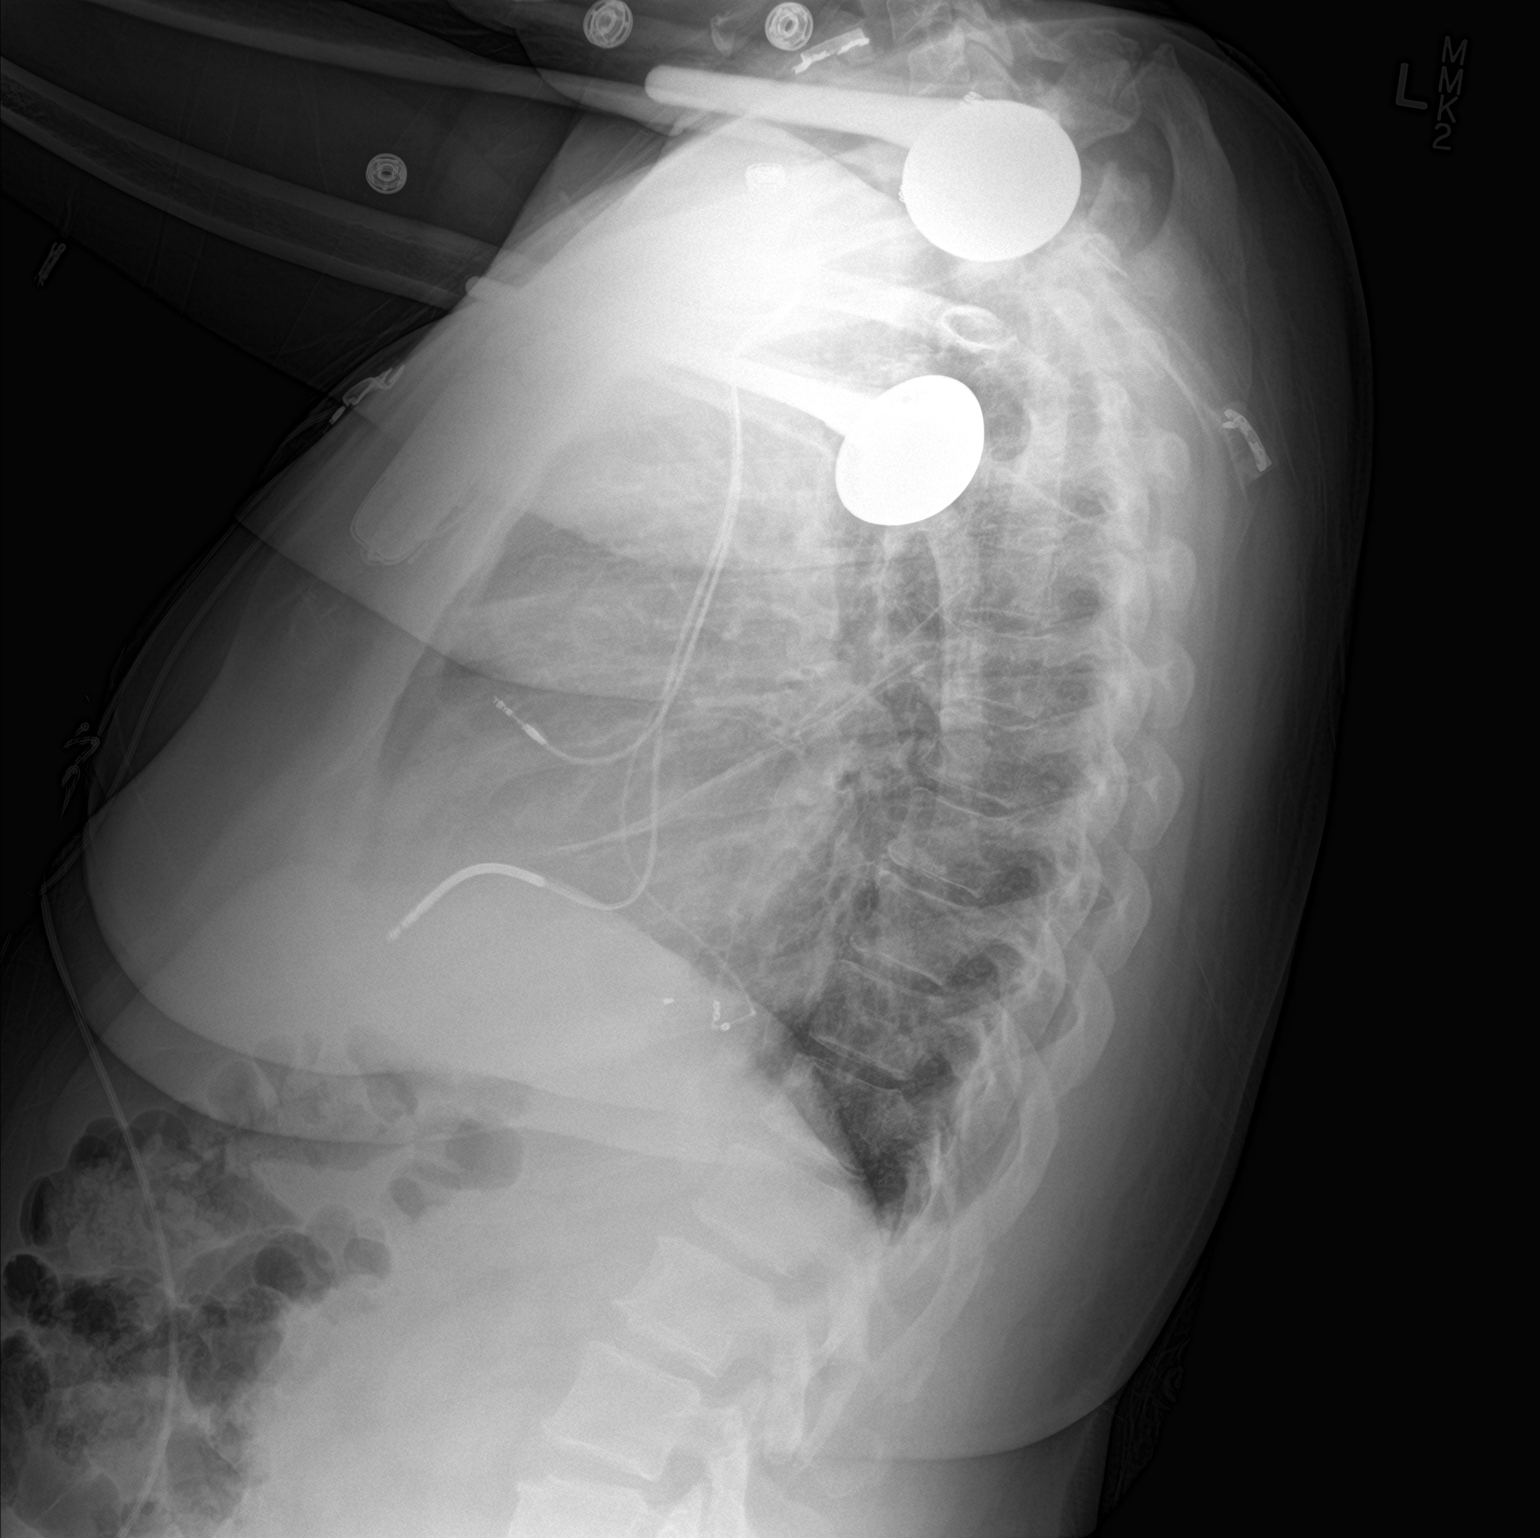

[2 of 2 positions shown; findings below may reference images not displayed]

FINDINGS: Cardiomegaly and left-sided pacemaker/ ICD again noted.

Pulmonary vascular congestion and minimal interstitial edema again
noted.

There is no evidence of pleural effusion or pneumothorax.

No acute bony abnormalities are present.

There has been little interval change since prior study.
IMPRESSION: Unchanged appearance of the chest with cardiomegaly and minimal
interstitial pulmonary edema.

## 2019-02-18 ENCOUNTER — Other Ambulatory Visit (HOSPITAL_COMMUNITY): Payer: Self-pay

## 2019-02-18 NOTE — Progress Notes (Signed)
Paramedicine Encounter    Patient ID: Michelle Andrade, female    DOB: 1961/03/07, 58 y.o.   MRN: [id number]    Patient Care Team: Henderson [place], [name] as PCP - General (Nurse Practitioner) Jorge Ny, LCSW as Social Worker (Licensed Clinical Social Worker)  Patient Active Problem List   Diagnosis Date Noted  . Secondary hyperparathyroidism of renal origin ([place]) 03/20/2018  . Severe obesity (BMI 35.0-39.9) with comorbidity ([place]) 12/19/2017  . CKD (chronic kidney disease) stage 4, GFR 15-29 ml/min (HCC) 06/07/2017  . Acute on chronic systolic (congestive) heart failure ([place])   . CHF (congestive heart failure) ([place]) 02/09/2017  . Pulmonary vascular congestion 02/09/2017  . Hypertension   . Sleep apnea   . Chondrocalcinosis of both knees 02/08/2017  . Primary osteoarthritis of both knees 02/08/2017  . Type 2 diabetes mellitus ([place]) 11/16/2016  . Borderline personality disorder ([place]) 10/17/2016  . Carpal tunnel syndrome 10/17/2016  . 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 Eczema 07/03/2016  . Atrial tachycardia ([place]) 05/31/2016  . 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 Onychomycosis of toenail 06/08/2015  . Cardiomyopathy ([place]) 06/07/2015  . Irritable bowel syndrome 09/27/2014  . PAD (peripheral artery disease) ([place]) 01/12/2014  . History of DVT (deep vein thrombosis) 01/12/2014  . Acute gout of right knee 12/29/2013  . Iron deficiency anemia 12/29/2013  . Lichenification and lichen simplex chronicus 12/26/2013  . Hyperlipidemia associated with type 2 diabetes mellitus ([place]) 11/10/2013  . Benign hypertension with chronic kidney disease, stage IV ([place]) 11/10/2013  . Disorder of magnesium metabolism 10/13/2013  . CAD in native artery 09/01/2013  . Anxiety state 07/27/2013  . Depressive disorder 07/27/2013  . Gastro-esophageal reflux disease without esophagitis 07/27/2013  . Low back  pain 06/03/2013  . Mitral valve disease 06/03/2013  . Pulmonary valve disorder 06/03/2013  . Tricuspid valve disorder 06/03/2013  . 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 Allergic rhinitis 02/07/2013  . 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 Pain in joint 02/07/2013    Current Outpatient Medications:  .  amiodarone (PACERONE) 200 MG tablet, Take 200 mg by mouth daily., Disp: , Rfl:  .  aspirin 81 MG chewable tablet, Chew 81 mg by mouth daily., Disp: , Rfl:  .  Calcium Carbonate-Vitamin D 600-400 MG-UNIT tablet, Take by mouth., Disp: , Rfl:  .  Calcium Carbonate-Vitamin D3 (CALCIUM 600/VITAMIN D) 600-400 MG-UNIT TABS, Take 1 tablet by mouth 2 (two) times daily. , Disp: , Rfl:  .  carvedilol (COREG) 25 MG tablet, Take 12.5 mg by mouth 2 (two) times daily with a meal., Disp: , Rfl:  .  colchicine 0.6 MG tablet, Take 2 tablets now and 1 an hour later, Disp: , Rfl:  .  ferrous sulfate 325 (65 FE) MG tablet, Take 325 mg by mouth 3 (three) times daily. , Disp: , Rfl:  .  glipiZIDE (GLUCOTROL) 5 MG tablet, Take 2.5 mg by mouth daily before breakfast., Disp: , Rfl:  .  hydrALAZINE (APRESOLINE) 50 MG tablet, Take 1.5 tablets (75 mg total) by mouth 3 (three) times daily., Disp: 405 tablet, Rfl: 3 .  isosorbide mononitrate (IMDUR) 60 MG 24 hr tablet, Take 1 tablet (60 mg total) by mouth daily., Disp: 30 tablet, Rfl: 0 .  loratadine (CLARITIN) 10 MG tablet, Take 10 mg by mouth daily., Disp: , Rfl:  .  Magnesium Oxide 420 MG TABS, Take 840 mg by mouth daily., Disp: , Rfl:  .  Multiple  Vitamin (MULTIVITAMIN) tablet, Take 1 tablet by mouth daily., Disp: , Rfl:  .  omeprazole (PRILOSEC) 40 MG capsule, Take 40 mg by mouth daily., Disp: , Rfl:  .  potassium chloride (K-DUR,KLOR-CON) 10 MEQ tablet, Take 4 tablets (40 mEq total) by mouth 2 (two) times daily., Disp: 240 tablet, Rfl: 11 .  rOPINIRole (REQUIP) 0.5 MG tablet, Take 0.5 mg by mouth at bedtime. , Disp: , Rfl:  .  sertraline (ZOLOFT) 100 MG tablet,  Take 150 mg by mouth daily., Disp: , Rfl:  .  simvastatin (ZOCOR) 20 MG tablet, Take 20 mg by mouth daily., Disp: , Rfl:  .  spironolactone (ALDACTONE) 25 MG tablet, Take 1 tablet (25 mg total) by mouth daily., Disp: 90 tablet, Rfl: 3 .  torsemide (DEMADEX) 20 MG tablet, Take 1 tablet (20 mg total) by mouth 2 (two) times daily., Disp: 120 tablet, Rfl: 3 .  diclofenac sodium (VOLTAREN) 1 % GEL, Apply 2 g topically 4 (four) times daily., Disp: 100 g, Rfl: 0 .  Investigational - Study Medication, Take 1 tablet by mouth 2 (two) times daily. Study name: Galactic Heart Failure Study Additional study details: Omecamtiv Mecarbil or Placebo, Disp: 1 each, Rfl: PRN .  metolazone (ZAROXOLYN) 2.5 MG tablet, Take 1 tablet (2.5 mg total) by mouth once a week. Monday, Disp: 12 tablet, Rfl: 0 Allergies  Allergen Reactions  . Codeine Itching  . Gabapentin Itching  . Haldol [Haloperidol Lactate] Other (See Comments)    Dizziness, hallucinations  . Levofloxacin     Other reaction(s): Other (See Comments) Projectile vomiting   . Lisinopril Itching and Swelling  . Losartan Itching  . Morphine And Related Nausea And Vomiting  . Penicillins Nausea And Vomiting    "Vomiting, upset stomach, Headache" Has patient had a PCN reaction causing immediate rash, facial/tongue/throat swelling, SOB or lightheadedness with hypotension: No Has patient had a PCN reaction causing severe rash involving mucus membranes or skin necrosis: No Has patient had a PCN reaction that required hospitalization: No Has patient had a PCN reaction occurring within the last 10 years: No If all of the above answers are "NO", then may proceed with Cephalosporin use.      Social History   Socioeconomic History  . Marital status: Single    Spouse name: Not on file  . Number of children: Not on file  . Years of education: Not on file  . Highest education level: Not on file  Occupational History  . Not on file  Social Needs  . Financial  resource strain: Not on file  . Food insecurity    Worry: Not on file    Inability: Not on file  . Transportation needs    Medical: Not on file    Non-medical: Not on file  Tobacco Use  . Smoking status: Never Smoker  . Smokeless tobacco: Never Used  Substance and Sexual Activity  . Alcohol use: No  . Drug use: No  . Sexual activity: Not on file  Lifestyle  . Physical activity    Days per week: Not on file    Minutes per session: Not on file  . Stress: Not on file  Relationships  . Social Herbalist on phone: Not on file    Gets together: Not on file    Attends religious service: Not on file    Active member of club or organization: Not on file    Attends meetings of clubs or organizations: Not on file  Relationship status: Not on file  . Intimate partner violence    Fear of current or ex partner: Not on file    Emotionally abused: Not on file    Physically abused: Not on file    Forced sexual activity: Not on file  Other Topics Concern  . Not on file  Social History Narrative  . Not on file    Physical Exam Pulmonary:     Effort: No respiratory distress.     Breath sounds: No wheezing.  Abdominal:     Palpations: Abdomen is soft.  Musculoskeletal:     Right lower leg: No edema.     Left lower leg: No edema.  Skin:    General: Skin is warm and dry.         No future appointments.   BP 118/80 (BP Location: Right Arm, Patient Position: Sitting)   Pulse 84   Temp (!) 97.5 F (36.4 C)   Resp 20   Wt 192 lb 6.4 oz (87.3 kg)   SpO2 98%   BMI 34.08 kg/m  CBG 243  Weight yesterday-can't remember Last visit weight-190  ATF pt CAO x4 in her living room with no complaints, she stated that she's been in the house all day." She said that she "just finish eating half of a calzone from last night".  She stated that she tries to eat right but usually can't.  She seemed to be sob upon my arrival, she stated that she was walking around a lot due to  excitement for my arrival.  After several mins her RR returned to normal. Pt denies sob, chest pain and dizziness. She appears to be in good spirits this afternoon.  Glipizide was increased to 5 mg by her physician. She has taken all of her medications for the week. rx bottles verified and pill box refilled.    Med change---Glipizide increased to 5mg   Medication ordered: None  Zahid Carneiro, EMT Paramedic [phone] 02/20/2019    ACTION: Home visit completed

## 2019-02-19 IMAGING — DX DG CHEST 1V PORT
1 series · 1 of 1 positions shown · non-contrast
Comparison: 02/10/2017 and prior radiographs

CLINICAL DATA: Acute shortness of breath

EXAM:
PORTABLE CHEST 1 VIEW

[chest ap]
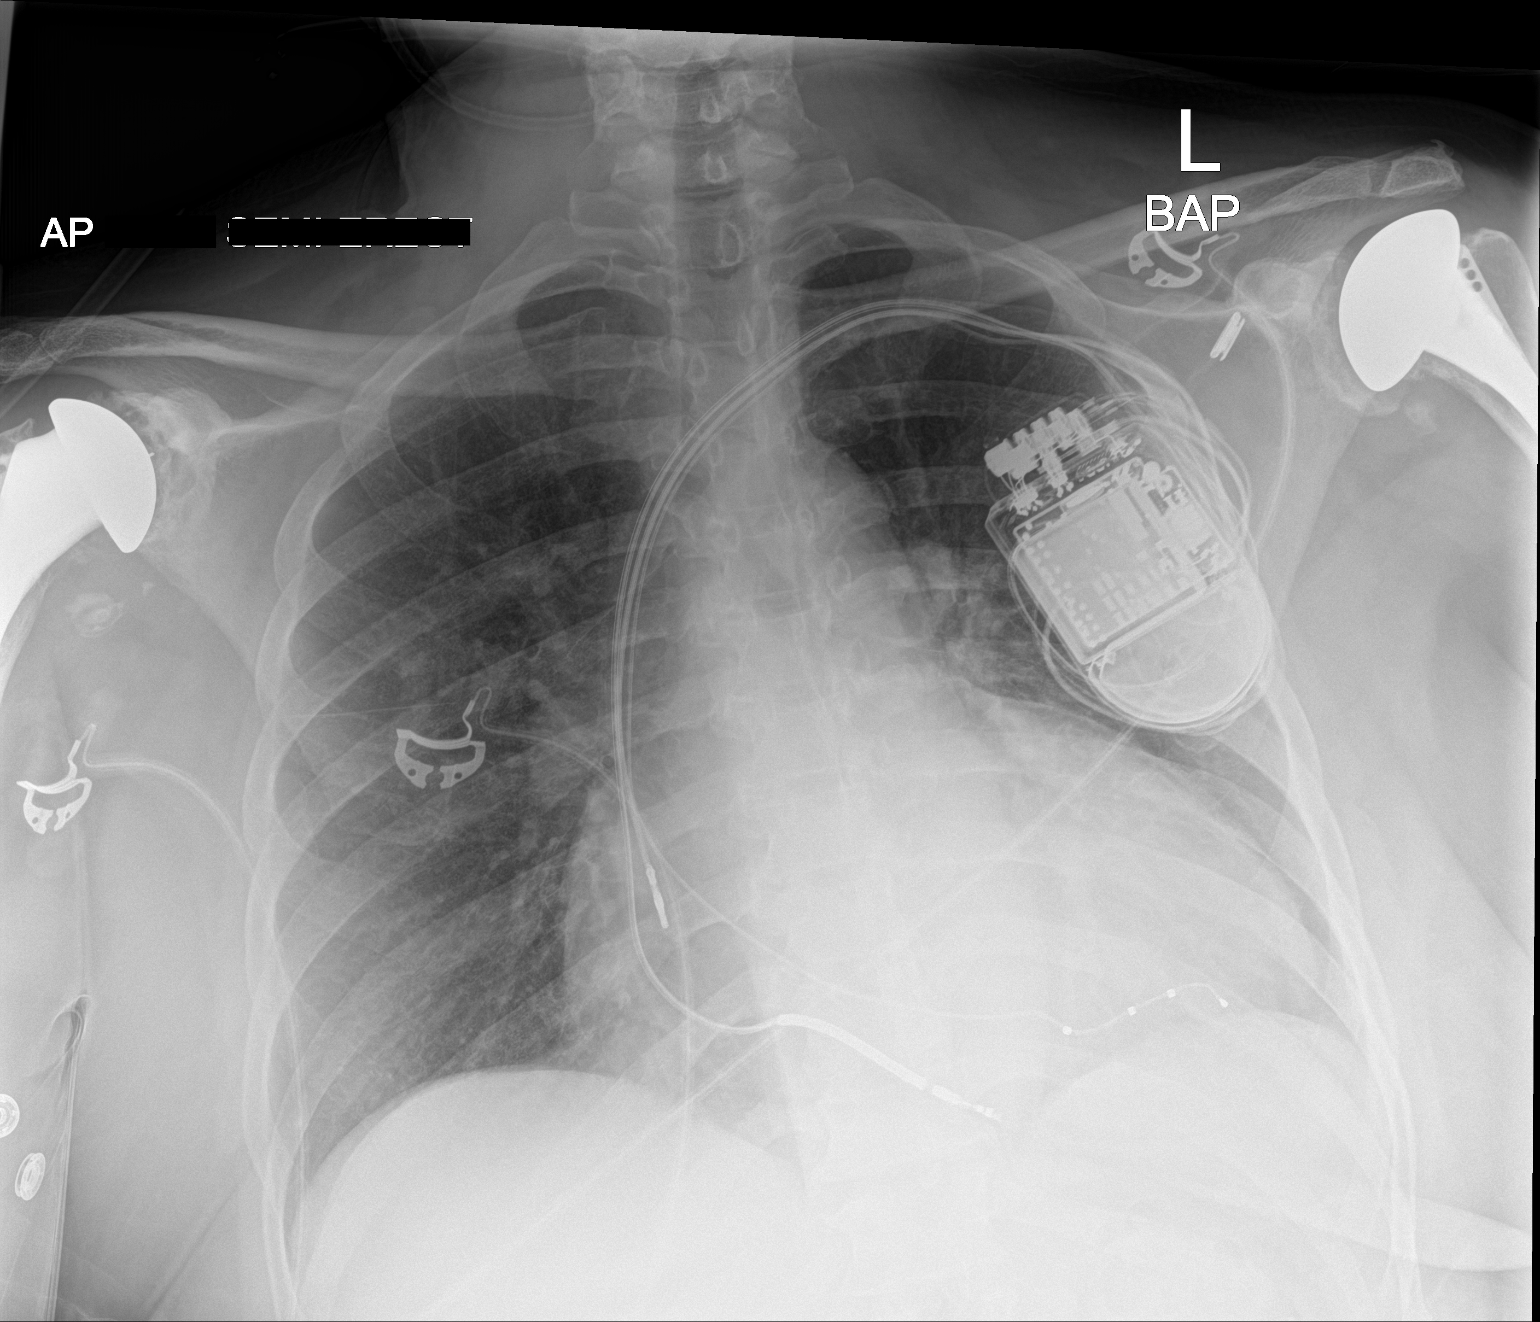

[1 of 1 positions shown; findings below may reference images not displayed]

FINDINGS: Cardiomegaly and left AICD/pacemaker again noted.

Mild pulmonary vascular congestion is present.

There is no evidence of focal airspace disease, pulmonary edema,
suspicious pulmonary nodule/mass, pleural effusion, or pneumothorax.

No acute bony abnormalities are identified. Bilateral shoulder
arthroplasty changes again noted.
IMPRESSION: Cardiomegaly and mild pulmonary vascular congestion.

## 2019-03-04 ENCOUNTER — Telehealth (HOSPITAL_COMMUNITY): Payer: Self-pay

## 2019-03-04 NOTE — Telephone Encounter (Signed)
I called Michelle Andrade on her cell phone to see if she was available for an appointment. She did not answer so I left a message requesting she call me back.

## 2019-03-04 NOTE — Telephone Encounter (Signed)
I called Michelle Andrade to schedule an appointment. She did not answer so I left a message requesting she call me back.

## 2019-03-04 NOTE — Telephone Encounter (Signed)
I contacted Michelle Andrade to see if she was OK and schedule an appointment. She stated she was not home today and would be able to meet anytime tomorrow. We agreed to meet at 16:00 on Thursday.

## 2019-03-05 ENCOUNTER — Other Ambulatory Visit (HOSPITAL_COMMUNITY): Payer: Self-pay

## 2019-03-05 ENCOUNTER — Telehealth (HOSPITAL_COMMUNITY): Payer: Self-pay | Admitting: Licensed Clinical Social Worker

## 2019-03-05 NOTE — Progress Notes (Signed)
Paramedicine Encounter    Patient ID: Michelle Andrade, female    DOB: [birth year], 58 y.o.   MRN: [id number]   Patient Care Team: Henderson [place], [name] as PCP - General (Nurse Practitioner) Jorge Ny, LCSW as Social Worker (Licensed Clinical Social Worker)  Patient Active Problem List   Diagnosis Date Noted  . Secondary hyperparathyroidism of renal origin ([place]) 03/20/2018  . Severe obesity (BMI 35.0-39.9) with comorbidity ([place]) 12/19/2017  . CKD (chronic kidney disease) stage 4, GFR 15-29 ml/min (HCC) 06/07/2017  . Acute on chronic systolic (congestive) heart failure ([place])   . CHF (congestive heart failure) ([place]) 02/09/2017  . Pulmonary vascular congestion 02/09/2017  . Hypertension   . Sleep apnea   . Chondrocalcinosis of both knees 02/08/2017  . Primary osteoarthritis of both knees 02/08/2017  . Type 2 diabetes mellitus ([place]) 11/16/2016  . Borderline personality disorder ([place]) 10/17/2016  . Carpal tunnel syndrome 10/17/2016  . 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 Eczema 07/03/2016  . Atrial tachycardia ([place]) 05/31/2016  . 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 Onychomycosis of toenail 06/08/2015  . Cardiomyopathy ([place]) 06/07/2015  . Irritable bowel syndrome 09/27/2014  . PAD (peripheral artery disease) ([place]) 01/12/2014  . History of DVT (deep vein thrombosis) 01/12/2014  . Acute gout of right knee 12/29/2013  . Iron deficiency anemia 12/29/2013  . Lichenification and lichen simplex chronicus 12/26/2013  . Hyperlipidemia associated with type 2 diabetes mellitus ([place]) 11/10/2013  . Benign hypertension with chronic kidney disease, stage IV ([place]) 11/10/2013  . Disorder of magnesium metabolism 10/13/2013  . CAD in native artery 09/01/2013  . Anxiety state 07/27/2013  . Depressive disorder 07/27/2013  . Gastro-esophageal reflux disease without esophagitis 07/27/2013  . Low back  pain 06/03/2013  . Mitral valve disease 06/03/2013  . Pulmonary valve disorder 06/03/2013  . Tricuspid valve disorder 06/03/2013  . 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 Allergic rhinitis 02/07/2013  . 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 Pain in joint 02/07/2013    Current Outpatient Medications:  .  amiodarone (PACERONE) 200 MG tablet, Take 200 mg by mouth daily., Disp: , Rfl:  .  aspirin 81 MG chewable tablet, Chew 81 mg by mouth daily., Disp: , Rfl:  .  Calcium Carbonate-Vitamin D 600-400 MG-UNIT tablet, Take by mouth., Disp: , Rfl:  .  carvedilol (COREG) 25 MG tablet, Take 12.5 mg by mouth 2 (two) times daily with a meal., Disp: , Rfl:  .  colchicine 0.6 MG tablet, Take 2 tablets now and 1 an hour later, Disp: , Rfl:  .  diclofenac sodium (VOLTAREN) 1 % GEL, Apply 2 g topically 4 (four) times daily., Disp: 100 g, Rfl: 0 .  ferrous sulfate 325 (65 FE) MG tablet, Take 325 mg by mouth 3 (three) times daily. , Disp: , Rfl:  .  glipiZIDE (GLUCOTROL) 5 MG tablet, Take 2.5 mg by mouth daily before breakfast., Disp: , Rfl:  .  hydrALAZINE (APRESOLINE) 50 MG tablet, Take 1.5 tablets (75 mg total) by mouth 3 (three) times daily., Disp: 405 tablet, Rfl: 3 .  Investigational - Study Medication, Take 1 tablet by mouth 2 (two) times daily. Study name: Galactic Heart Failure Study Additional study details: Omecamtiv Mecarbil or Placebo, Disp: 1 each, Rfl: PRN .  isosorbide mononitrate (IMDUR) 60 MG 24 hr tablet, Take 1 tablet (60 mg total) by mouth daily., Disp: 30 tablet, Rfl: 0 .  loratadine (CLARITIN) 10 MG tablet, Take 10  mg by mouth daily., Disp: , Rfl:  .  Magnesium Oxide 420 MG TABS, Take 840 mg by mouth daily., Disp: , Rfl:  .  metolazone (ZAROXOLYN) 2.5 MG tablet, Take 1 tablet (2.5 mg total) by mouth once a week. Monday, Disp: 12 tablet, Rfl: 0 .  Multiple Vitamin (MULTIVITAMIN) tablet, Take 1 tablet by mouth daily., Disp: , Rfl:  .  omeprazole (PRILOSEC) 40 MG capsule, Take 40 mg by  mouth daily., Disp: , Rfl:  .  potassium chloride (K-DUR,KLOR-CON) 10 MEQ tablet, Take 4 tablets (40 mEq total) by mouth 2 (two) times daily., Disp: 240 tablet, Rfl: 11 .  rOPINIRole (REQUIP) 0.5 MG tablet, Take 0.5 mg by mouth at bedtime. , Disp: , Rfl:  .  sertraline (ZOLOFT) 100 MG tablet, Take 150 mg by mouth daily., Disp: , Rfl:  .  simvastatin (ZOCOR) 20 MG tablet, Take 20 mg by mouth daily., Disp: , Rfl:  .  spironolactone (ALDACTONE) 25 MG tablet, Take 1 tablet (25 mg total) by mouth daily., Disp: 90 tablet, Rfl: 3 .  torsemide (DEMADEX) 20 MG tablet, Take 1 tablet (20 mg total) by mouth 2 (two) times daily., Disp: 120 tablet, Rfl: 3 .  Calcium Carbonate-Vitamin D3 (CALCIUM 600/VITAMIN D) 600-400 MG-UNIT TABS, Take 1 tablet by mouth 2 (two) times daily. , Disp: , Rfl:  Allergies  Allergen Reactions  . Codeine Itching  . Gabapentin Itching  . Haldol [Haloperidol Lactate] Other (See Comments)    Dizziness, hallucinations  . Levofloxacin     Other reaction(s): Other (See Comments) Projectile vomiting   . Lisinopril Itching and Swelling  . Losartan Itching  . Morphine And Related Nausea And Vomiting  . Penicillins Nausea And Vomiting    "Vomiting, upset stomach, Headache" Has patient had a PCN reaction causing immediate rash, facial/tongue/throat swelling, SOB or lightheadedness with hypotension: No Has patient had a PCN reaction causing severe rash involving mucus membranes or skin necrosis: No Has patient had a PCN reaction that required hospitalization: No Has patient had a PCN reaction occurring within the last 10 years: No If all of the above answers are "NO", then may proceed with Cephalosporin use.       Social History   Socioeconomic History  . Marital status: Single    Spouse name: Not on file  . Number of children: Not on file  . Years of education: Not on file  . Highest education level: Not on file  Occupational History  . Not on file  Social Needs  .  Financial resource strain: Not on file  . Food insecurity    Worry: Not on file    Inability: Not on file  . Transportation needs    Medical: Not on file    Non-medical: Not on file  Tobacco Use  . Smoking status: Never Smoker  . Smokeless tobacco: Never Used  Substance and Sexual Activity  . Alcohol use: No  . Drug use: No  . Sexual activity: Not on file  Lifestyle  . Physical activity    Days per week: Not on file    Minutes per session: Not on file  . Stress: Not on file  Relationships  . Social Herbalist on phone: Not on file    Gets together: Not on file    Attends religious service: Not on file    Active member of club or organization: Not on file    Attends meetings of clubs or organizations: Not on file  Relationship status: Not on file  . Intimate partner violence    Fear of current or ex partner: Not on file    Emotionally abused: Not on file    Physically abused: Not on file    Forced sexual activity: Not on file  Other Topics Concern  . Not on file  Social History Narrative  . Not on file    Physical Exam Cardiovascular:     Rate and Rhythm: Normal rate and regular rhythm.     Pulses: Normal pulses.  Pulmonary:     Effort: Pulmonary effort is normal.     Breath sounds: Normal breath sounds.  Musculoskeletal: Normal range of motion.     Right lower leg: No edema.     Left lower leg: No edema.  Skin:    General: Skin is warm and dry.     Capillary Refill: Capillary refill takes less than 2 seconds.  Neurological:     Mental Status: She is alert and oriented to person, place, and time.  Psychiatric:        Mood and Affect: Mood normal.         Future Appointments  Date Time Provider [place]  03/16/2019 12:30 PM [place] [id number] None    BP (!) 100/56 (BP Location: Left Arm, Patient Position: Sitting, Cuff Size: Normal)   Pulse 78   Resp 18   Wt 192 lb 9.6 oz (87.4 kg)   SpO2 96%   BMI  34.12 kg/m   Weight yesterday- Did not weigh Last visit weight- 192 lb  Ms Whitcomb was seen at home today and reported feeling well. She denied chest pain, SOB, headache, dizziness, orthopnea, cough or fever over the past week. She stated she has been compliant with her medications over the past week and her weight has been stable. Her medications were verified and her pillbox was refilled. I will follow up in two weeks.   [name], EMT 03/05/19  ACTION: Home visit completed Next visit planned for 1 week

## 2019-03-05 NOTE — Telephone Encounter (Signed)
Pt enrolled in [employer] Me[employer] 3 month study through [facility]- patient's 3 month period has now ended so CSW spoke with pt to complete an exit interview regarding patient's experience:  1. Since being on [employer]oms Meals have you noticed any changes in your health or the way you feel physically? no Weight loss? Lost a few pounds- maybe 3. Improvement in BP? unsure  2. What do you like about the program? Enjoyed the food and thought it was very convenient as she doesn't normally cook. What would you like to change/add? Wishes there was more variety cause she got tired of the options.  3. How did you get food prior to [place]? a. Did you get food stamps? no b. Did someone help financially? no c. Did you access food banks? no d. Do you anticipate any issues transitioning back to getting your own food after this program ends? no  4. Would you be interested in future food pilots? yes  5. Would you like a dietary consult? yes  6. Do you have internet access? Tablet/Smart Phone? Tablet, smart phone, computer   Jorge Ny, LCSW Clinical Social Worker [facility] Desk#: [phone] Cell#: [phone]

## 2019-03-16 ENCOUNTER — Encounter: Payer: Self-pay | Admitting: *Deleted

## 2019-03-16 DIAGNOSIS — Z006 Encounter for examination for normal comparison and control in clinical research program: Secondary | ICD-10-CM

## 2019-03-16 NOTE — Research (Signed)
Spoke with patient via phone for [place] [facility] visit. No AE/SAE/PEP to report. All medication changes made reported to sponsor. I thanked her for her participation in the study.

## 2019-03-18 ENCOUNTER — Other Ambulatory Visit (HOSPITAL_COMMUNITY): Payer: Self-pay

## 2019-03-18 NOTE — Progress Notes (Signed)
Paramedicine Encounter    Patient ID: Latika Kronick, female    DOB: Dec 15, 1960, 58 y.o.   MRN: [id number]   Patient Care Team: Henderson Baltimore, [name] as PCP - General (Nurse Practitioner) Jorge Ny, LCSW as Social Worker (Licensed Clinical Social Worker)  Patient Active Problem List   Diagnosis Date Noted  . Secondary hyperparathyroidism of renal origin ([place]) 03/20/2018  . Severe obesity (BMI 35.0-39.9) with comorbidity ([employer]) 12/19/2017  . CKD (chronic kidney disease) stage 4, GFR 15-29 ml/min (HCC) 06/07/2017  . Acute on chronic systolic (congestive) heart failure ([place])   . CHF (congestive heart failure) ([place]) 02/09/2017  . Pulmonary vascular congestion 02/09/2017  . Hypertension   . Sleep apnea   . Chondrocalcinosis of both knees 02/08/2017  . Primary osteoarthritis of both knees 02/08/2017  . Type 2 diabetes mellitus ([place]) 11/16/2016  . Borderline personality disorder ([place]) 10/17/2016  . Carpal tunnel syndrome 10/17/2016  . 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 Eczema 07/03/2016  . Atrial tachycardia ([place]) 05/31/2016  . 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 Onychomycosis of toenail 06/08/2015  . Cardiomyopathy ([place]) 06/07/2015  . Irritable bowel syndrome 09/27/2014  . PAD (peripheral artery disease) ([place]) 01/12/2014  . History of DVT (deep vein thrombosis) 01/12/2014  . Acute gout of right knee 12/29/2013  . Iron deficiency anemia 12/29/2013  . Lichenification and lichen simplex chronicus 12/26/2013  . Hyperlipidemia associated with type 2 diabetes mellitus ([place]) 11/10/2013  . Benign hypertension with chronic kidney disease, stage IV ([place]) 11/10/2013  . Disorder of magnesium metabolism 10/13/2013  . CAD in native artery 09/01/2013  . Anxiety state 07/27/2013  . Depressive disorder 07/27/2013  . Gastro-esophageal reflux disease without esophagitis 07/27/2013  . Low back  pain 06/03/2013  . Mitral valve disease 06/03/2013  . Pulmonary valve disorder 06/03/2013  . Tricuspid valve disorder 06/03/2013  . 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 Allergic rhinitis 02/07/2013  . 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 Pain in joint 02/07/2013    Current Outpatient Medications:  .  amiodarone (PACERONE) 200 MG tablet, Take 200 mg by mouth daily., Disp: , Rfl:  .  aspirin 81 MG chewable tablet, Chew 81 mg by mouth daily., Disp: , Rfl:  .  Calcium Carbonate-Vitamin D3 (CALCIUM 600/VITAMIN D) 600-400 MG-UNIT TABS, Take 1 tablet by mouth 2 (two) times daily. , Disp: , Rfl:  .  carvedilol (COREG) 25 MG tablet, Take 12.5 mg by mouth 2 (two) times daily with a meal., Disp: , Rfl:  .  colchicine 0.6 MG tablet, Take 2 tablets now and 1 an hour later, Disp: , Rfl:  .  diclofenac sodium (VOLTAREN) 1 % GEL, Apply 2 g topically 4 (four) times daily., Disp: 100 g, Rfl: 0 .  ferrous sulfate 325 (65 FE) MG tablet, Take 325 mg by mouth 3 (three) times daily. , Disp: , Rfl:  .  glipiZIDE (GLUCOTROL) 5 MG tablet, Take 2.5 mg by mouth daily before breakfast., Disp: , Rfl:  .  hydrALAZINE (APRESOLINE) 50 MG tablet, Take 1.5 tablets (75 mg total) by mouth 3 (three) times daily., Disp: 405 tablet, Rfl: 3 .  isosorbide mononitrate (IMDUR) 60 MG 24 hr tablet, Take 1 tablet (60 mg total) by mouth daily., Disp: 30 tablet, Rfl: 0 .  loratadine (CLARITIN) 10 MG tablet, Take 10 mg by mouth daily., Disp: , Rfl:  .  Magnesium Oxide 420 MG TABS, Take 840 mg by mouth daily., Disp: ,  Rfl:  .  metolazone (ZAROXOLYN) 2.5 MG tablet, Take 1 tablet (2.5 mg total) by mouth once a week. Monday, Disp: 12 tablet, Rfl: 0 .  Multiple Vitamin (MULTIVITAMIN) tablet, Take 1 tablet by mouth daily., Disp: , Rfl:  .  omeprazole (PRILOSEC) 40 MG capsule, Take 40 mg by mouth daily., Disp: , Rfl:  .  potassium chloride (K-DUR,KLOR-CON) 10 MEQ tablet, Take 4 tablets (40 mEq total) by mouth 2 (two) times daily., Disp: 240  tablet, Rfl: 11 .  rOPINIRole (REQUIP) 0.5 MG tablet, Take 0.5 mg by mouth at bedtime. , Disp: , Rfl:  .  sertraline (ZOLOFT) 100 MG tablet, Take 150 mg by mouth daily., Disp: , Rfl:  .  simvastatin (ZOCOR) 20 MG tablet, Take 20 mg by mouth daily., Disp: , Rfl:  .  spironolactone (ALDACTONE) 25 MG tablet, Take 1 tablet (25 mg total) by mouth daily., Disp: 90 tablet, Rfl: 3 .  torsemide (DEMADEX) 20 MG tablet, Take 1 tablet (20 mg total) by mouth 2 (two) times daily., Disp: 120 tablet, Rfl: 3 .  Calcium Carbonate-Vitamin D 600-400 MG-UNIT tablet, Take by mouth., Disp: , Rfl:  .  Investigational - Study Medication, Take 1 tablet by mouth 2 (two) times daily. Study name: Galactic Heart Failure Study Additional study details: Omecamtiv Mecarbil or Placebo (Patient not taking: Reported on 03/18/2019), Disp: 1 each, Rfl: PRN Allergies  Allergen Reactions  . Codeine Itching  . Gabapentin Itching  . Haldol [Haloperidol Lactate] Other (See Comments)    Dizziness, hallucinations  . Levofloxacin     Other reaction(s): Other (See Comments) Projectile vomiting   . Lisinopril Itching and Swelling  . Losartan Itching  . Morphine And Related Nausea And Vomiting  . Penicillins Nausea And Vomiting    "Vomiting, upset stomach, Headache" Has patient had a PCN reaction causing immediate rash, facial/tongue/throat swelling, SOB or lightheadedness with hypotension: No Has patient had a PCN reaction causing severe rash involving mucus membranes or skin necrosis: No Has patient had a PCN reaction that required hospitalization: No Has patient had a PCN reaction occurring within the last 10 years: No If all of the above answers are "NO", then may proceed with Cephalosporin use.       Social History   Socioeconomic History  . Marital status: Single    Spouse name: Not on file  . Number of children: Not on file  . Years of education: Not on file  . Highest education level: Not on file  Occupational History   . Not on file  Social Needs  . Financial resource strain: Not on file  . Food insecurity    Worry: Not on file    Inability: Not on file  . Transportation needs    Medical: Not on file    Non-medical: Not on file  Tobacco Use  . Smoking status: Never Smoker  . Smokeless tobacco: Never Used  Substance and Sexual Activity  . Alcohol use: No  . Drug use: No  . Sexual activity: Not on file  Lifestyle  . Physical activity    Days per week: Not on file    Minutes per session: Not on file  . Stress: Not on file  Relationships  . Social Herbalist on phone: Not on file    Gets together: Not on file    Attends religious service: Not on file    Active member of club or organization: Not on file    Attends meetings of  clubs or organizations: Not on file    Relationship status: Not on file  . Intimate partner violence    Fear of current or ex partner: Not on file    Emotionally abused: Not on file    Physically abused: Not on file    Forced sexual activity: Not on file  Other Topics Concern  . Not on file  Social History Narrative  . Not on file    Physical Exam Cardiovascular:     Pulses: Normal pulses.  Pulmonary:     Effort: Pulmonary effort is normal.  Musculoskeletal: Normal range of motion.     Right lower leg: No edema.     Left lower leg: No edema.  Skin:    General: Skin is warm and dry.     Capillary Refill: Capillary refill takes less than 2 seconds.  Neurological:     Mental Status: She is alert and oriented to person, place, and time.  Psychiatric:        Mood and Affect: Mood normal.         No future appointments.  BP 120/70 (BP Location: Left Arm, Patient Position: Sitting, Cuff Size: Normal)   Pulse 90   Resp 18   Wt 190 lb 9.6 oz (86.5 kg)   SpO2 100%   BMI 33.76 kg/m   Weight yesterday- 192.6 lb Last visit weight- 192 lb  Ms Hattabaugh was seen at home today and reported feeling well with the exception of a pulled muscle in her  left thigh. She denied chest pain, SOB, headache, dizziness, increased orthopnea, cough or fever since our last visit. She reported being compliant with her medications and her weight has been stable. Her medications were verified and her pillbox was refilled. She had two notable changes in her medications this week, one being the discontinuation of the investigational medication she was on and the other being and increase of glipizide to 5 mg daily. I will contact the pharmacy and have the make the necessary changes to her medication list.   [name], EMT 03/18/19  ACTION: Home visit completed Next visit planned for 2 weeks

## 2019-03-23 ENCOUNTER — Other Ambulatory Visit: Payer: Self-pay

## 2019-03-31 ENCOUNTER — Telehealth (HOSPITAL_COMMUNITY): Payer: Self-pay

## 2019-03-31 NOTE — Telephone Encounter (Signed)
I called Michelle Andrade to schedule an appointment. She stated she would be available all day tomorrow so we agreed to meet at 11:00.

## 2019-04-01 ENCOUNTER — Other Ambulatory Visit (HOSPITAL_COMMUNITY): Payer: Self-pay

## 2019-04-01 NOTE — Progress Notes (Signed)
Paramedicine Encounter    Patient ID: Michelle Andrade, female    DOB: 06/12/1961, 58 y.o.   MRN: [id number]   Patient Care Team: Henderson Baltimore, [name] as PCP - General (Nurse Practitioner) Jorge Ny, LCSW as Social Worker (Licensed Clinical Social Worker)  Patient Active Problem List   Diagnosis Date Noted  . Secondary hyperparathyroidism of renal origin ([place]) 03/20/2018  . Severe obesity (BMI 35.0-39.9) with comorbidity ([place]) 12/19/2017  . CKD (chronic kidney disease) stage 4, GFR 15-29 ml/min (HCC) 06/07/2017  . Acute on chronic systolic (congestive) heart failure ([place])   . CHF (congestive heart failure) ([place]) 02/09/2017  . Pulmonary vascular congestion 02/09/2017  . Hypertension   . Sleep apnea   . Chondrocalcinosis of both knees 02/08/2017  . Primary osteoarthritis of both knees 02/08/2017  . Type 2 diabetes mellitus ([place]) 11/16/2016  . Borderline personality disorder ([place]) 10/17/2016  . Carpal tunnel syndrome 10/17/2016  . 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 Eczema 07/03/2016  . Atrial tachycardia ([place]) 05/31/2016  . 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 Onychomycosis of toenail 06/08/2015  . Cardiomyopathy ([place]) 06/07/2015  . Irritable bowel syndrome 09/27/2014  . PAD (peripheral artery disease) ([place]) 01/12/2014  . History of DVT (deep vein thrombosis) 01/12/2014  . Acute gout of right knee 12/29/2013  . Iron deficiency anemia 12/29/2013  . Lichenification and lichen simplex chronicus 12/26/2013  . Hyperlipidemia associated with type 2 diabetes mellitus ([place]) 11/10/2013  . Benign hypertension with chronic kidney disease, stage IV ([place]) 11/10/2013  . Disorder of magnesium metabolism 10/13/2013  . CAD in native artery 09/01/2013  . Anxiety state 07/27/2013  . Depressive disorder 07/27/2013  . Gastro-esophageal reflux disease without esophagitis 07/27/2013  . Low back  pain 06/03/2013  . Mitral valve disease 06/03/2013  . Pulmonary valve disorder 06/03/2013  . Tricuspid valve disorder 06/03/2013  . 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 Allergic rhinitis 02/07/2013  . 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 Pain in joint 02/07/2013    Current Outpatient Medications:  .  amiodarone (PACERONE) 200 MG tablet, Take 200 mg by mouth daily., Disp: , Rfl:  .  aspirin 81 MG chewable tablet, Chew 81 mg by mouth daily., Disp: , Rfl:  .  Calcium Carbonate-Vitamin D3 (CALCIUM 600/VITAMIN D) 600-400 MG-UNIT TABS, Take 1 tablet by mouth 2 (two) times daily. , Disp: , Rfl:  .  carvedilol (COREG) 25 MG tablet, Take 12.5 mg by mouth 2 (two) times daily with a meal., Disp: , Rfl:  .  colchicine 0.6 MG tablet, Take 2 tablets now and 1 an hour later, Disp: , Rfl:  .  diclofenac sodium (VOLTAREN) 1 % GEL, Apply 2 g topically 4 (four) times daily., Disp: 100 g, Rfl: 0 .  ferrous sulfate 325 (65 FE) MG tablet, Take 325 mg by mouth 3 (three) times daily. , Disp: , Rfl:  .  glipiZIDE (GLUCOTROL) 5 MG tablet, Take 2.5 mg by mouth daily before breakfast., Disp: , Rfl:  .  hydrALAZINE (APRESOLINE) 50 MG tablet, Take 1.5 tablets (75 mg total) by mouth 3 (three) times daily., Disp: 405 tablet, Rfl: 3 .  isosorbide mononitrate (IMDUR) 60 MG 24 hr tablet, Take 1 tablet (60 mg total) by mouth daily., Disp: 30 tablet, Rfl: 0 .  loratadine (CLARITIN) 10 MG tablet, Take 10 mg by mouth daily., Disp: , Rfl:  .  Magnesium Oxide 420 MG TABS, Take 840 mg by mouth daily., Disp: ,  Rfl:  .  metolazone (ZAROXOLYN) 2.5 MG tablet, Take 1 tablet (2.5 mg total) by mouth once a week. Monday, Disp: 12 tablet, Rfl: 0 .  Multiple Vitamin (MULTIVITAMIN) tablet, Take 1 tablet by mouth daily., Disp: , Rfl:  .  omeprazole (PRILOSEC) 40 MG capsule, Take 40 mg by mouth daily., Disp: , Rfl:  .  potassium chloride (K-DUR,KLOR-CON) 10 MEQ tablet, Take 4 tablets (40 mEq total) by mouth 2 (two) times daily., Disp: 240  tablet, Rfl: 11 .  rOPINIRole (REQUIP) 0.5 MG tablet, Take 0.5 mg by mouth at bedtime. , Disp: , Rfl:  .  sertraline (ZOLOFT) 100 MG tablet, Take 150 mg by mouth daily., Disp: , Rfl:  .  simvastatin (ZOCOR) 20 MG tablet, Take 20 mg by mouth daily., Disp: , Rfl:  .  spironolactone (ALDACTONE) 25 MG tablet, Take 1 tablet (25 mg total) by mouth daily., Disp: 90 tablet, Rfl: 3 .  torsemide (DEMADEX) 20 MG tablet, Take 1 tablet (20 mg total) by mouth 2 (two) times daily., Disp: 120 tablet, Rfl: 3 .  Calcium Carbonate-Vitamin D 600-400 MG-UNIT tablet, Take by mouth., Disp: , Rfl:  .  Investigational - Study Medication, Take 1 tablet by mouth 2 (two) times daily. Study name: [facility] Heart Failure Study Additional study details: Omecamtiv Mecarbil or Placebo (Patient not taking: Reported on 03/18/2019), Disp: 1 each, Rfl: PRN Allergies  Allergen Reactions  . Codeine Itching  . Gabapentin Itching  . Haldol [Haloperidol Lactate] Other (See Comments)    Dizziness, hallucinations  . Levofloxacin     Other reaction(s): Other (See Comments) Projectile vomiting   . Lisinopril Itching and Swelling  . Losartan Itching  . Morphine And Related Nausea And Vomiting  . Penicillins Nausea And Vomiting    "Vomiting, upset stomach, Headache" Has patient had a PCN reaction causing immediate rash, facial/tongue/throat swelling, SOB or lightheadedness with hypotension: No Has patient had a PCN reaction causing severe rash involving mucus membranes or skin necrosis: No Has patient had a PCN reaction that required hospitalization: No Has patient had a PCN reaction occurring within the last 10 years: No If all of the above answers are "NO", then may proceed with Cephalosporin use.       Social History   Socioeconomic History  . Marital status: Single    Spouse name: Not on file  . Number of children: Not on file  . Years of education: Not on file  . Highest education level: Not on file  Occupational History   . Not on file  Social Needs  . Financial resource strain: Not on file  . Food insecurity    Worry: Not on file    Inability: Not on file  . Transportation needs    Medical: Not on file    Non-medical: Not on file  Tobacco Use  . Smoking status: Never Smoker  . Smokeless tobacco: Never Used  Substance and Sexual Activity  . Alcohol use: No  . Drug use: No  . Sexual activity: Not on file  Lifestyle  . Physical activity    Days per week: Not on file    Minutes per session: Not on file  . Stress: Not on file  Relationships  . Social Herbalist on phone: Not on file    Gets together: Not on file    Attends religious service: Not on file    Active member of club or organization: Not on file    Attends meetings of  clubs or organizations: Not on file    Relationship status: Not on file  . Intimate partner violence    Fear of current or ex partner: Not on file    Emotionally abused: Not on file    Physically abused: Not on file    Forced sexual activity: Not on file  Other Topics Concern  . Not on file  Social History Narrative  . Not on file    Physical Exam Cardiovascular:     Rate and Rhythm: Normal rate and regular rhythm.     Pulses: Normal pulses.  Pulmonary:     Effort: Pulmonary effort is normal.     Breath sounds: Normal breath sounds.  Abdominal:     General: There is no distension.  Musculoskeletal: Normal range of motion.     Right lower leg: No edema.     Left lower leg: No edema.  Skin:    General: Skin is warm and dry.     Capillary Refill: Capillary refill takes less than 2 seconds.  Neurological:     Mental Status: She is alert and oriented to person, place, and time.  Psychiatric:        Mood and Affect: Mood normal.         No future appointments.  BP 109/62 (BP Location: Left Arm, Patient Position: Sitting, Cuff Size: Normal)   Pulse 72   Resp 16   Wt 189 lb 6.4 oz (85.9 kg)   SpO2 98%   BMI 33.55 kg/m   Weight  yesterday- 189.6 lb Last visit weight- 190.6 lb  Michelle Andrade was seen at home today and reported feeling well. She denied chest pain, SOB, headache, dizziness, orthopnea, cough or fever since our last visit. She reported being compliant with her medications and her weight has been stable. Her medications were verified and her pillboxes were refilled. I will follow up in two weeks.   Michelle Andrade, EMT 04/01/19  ACTION: Home visit completed Next visit planned for 2 weeks

## 2019-04-10 ENCOUNTER — Telehealth (HOSPITAL_COMMUNITY): Payer: Self-pay | Admitting: Licensed Clinical Social Worker

## 2019-04-10 NOTE — Telephone Encounter (Signed)
CSW reached out to pt to check in regarding food and medication status at this time.  Patient doing well and is able to get what she needs.  Patient appreciates the check in and is hopeful that things start loosening up soon so she can return to church where she enjoys socializing.  CSW encouraged pt to reach out with any concerns and will continue to follow and assist as needed  Jorge Ny, [place] Worker [facility] [phone]

## 2019-04-14 ENCOUNTER — Telehealth (HOSPITAL_COMMUNITY): Payer: Self-pay

## 2019-04-14 NOTE — Telephone Encounter (Signed)
I called Ms Michelle Andrade to schedule an appointment. She stated she was available tomorrow at any time so we agreed to meet at 08:30.

## 2019-04-15 ENCOUNTER — Other Ambulatory Visit (HOSPITAL_COMMUNITY): Payer: Self-pay | Admitting: *Deleted

## 2019-04-15 ENCOUNTER — Other Ambulatory Visit (HOSPITAL_COMMUNITY): Payer: Self-pay

## 2019-04-15 MED ORDER — AMIODARONE HCL 200 MG PO TABS: 200.0000 mg | ORAL_TABLET | Freq: Every day | ORAL | 3 refills | Status: DC

## 2019-04-15 NOTE — Progress Notes (Signed)
Paramedicine Encounter    Patient ID: Michelle Andrade, female    DOB: [birth year], 58 y.o.   MRN: [id number]   Patient Care Team: Henderson Baltimore, [name] as PCP - General (Nurse Practitioner) Jorge Ny, LCSW as Social Worker (Licensed Clinical Social Worker)  Patient Active Problem List   Diagnosis Date Noted  . Secondary hyperparathyroidism of renal origin ([place]) 03/20/2018  . Severe obesity (BMI 35.0-39.9) with comorbidity ([place]) 12/19/2017  . CKD (chronic kidney disease) stage 4, GFR 15-29 ml/min (HCC) 06/07/2017  . Acute on chronic systolic (congestive) heart failure ([place])   . CHF (congestive heart failure) ([place]) 02/09/2017  . Pulmonary vascular congestion 02/09/2017  . Hypertension   . Sleep apnea   . Chondrocalcinosis of both knees 02/08/2017  . Primary osteoarthritis of both knees 02/08/2017  . Type 2 diabetes mellitus ([place]) 11/16/2016  . Borderline personality disorder ([place]) 10/17/2016  . Carpal tunnel syndrome 10/17/2016  . 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 Eczema 07/03/2016  . Atrial tachycardia ([place]) 05/31/2016  . 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 Onychomycosis of toenail 06/08/2015  . Cardiomyopathy ([place]) 06/07/2015  . Irritable bowel syndrome 09/27/2014  . PAD (peripheral artery disease) ([place]) 01/12/2014  . History of DVT (deep vein thrombosis) 01/12/2014  . Acute gout of right knee 12/29/2013  . Iron deficiency anemia 12/29/2013  . Lichenification and lichen simplex chronicus 12/26/2013  . Hyperlipidemia associated with type 2 diabetes mellitus ([place]) 11/10/2013  . Benign hypertension with chronic kidney disease, stage IV ([place]) 11/10/2013  . Disorder of magnesium metabolism 10/13/2013  . CAD in native artery 09/01/2013  . Anxiety state 07/27/2013  . Depressive disorder 07/27/2013  . Gastro-esophageal reflux disease without esophagitis 07/27/2013  . Low back  pain 06/03/2013  . Mitral valve disease 06/03/2013  . Pulmonary valve disorder 06/03/2013  . Tricuspid valve disorder 06/03/2013  . 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 Allergic rhinitis 02/07/2013  . 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 Pain in joint 02/07/2013    Current Outpatient Medications:  .  aspirin 81 MG chewable tablet, Chew 81 mg by mouth daily., Disp: , Rfl:  .  Calcium Carbonate-Vitamin D3 (CALCIUM 600/VITAMIN D) 600-400 MG-UNIT TABS, Take 1 tablet by mouth 2 (two) times daily. , Disp: , Rfl:  .  carvedilol (COREG) 25 MG tablet, Take 12.5 mg by mouth 2 (two) times daily with a meal., Disp: , Rfl:  .  colchicine 0.6 MG tablet, Take 2 tablets now and 1 an hour later, Disp: , Rfl:  .  diclofenac sodium (VOLTAREN) 1 % GEL, Apply 2 g topically 4 (four) times daily., Disp: 100 g, Rfl: 0 .  ferrous sulfate 325 (65 FE) MG tablet, Take 325 mg by mouth 3 (three) times daily. , Disp: , Rfl:  .  glipiZIDE (GLUCOTROL) 5 MG tablet, Take 2.5 mg by mouth daily before breakfast., Disp: , Rfl:  .  hydrALAZINE (APRESOLINE) 50 MG tablet, Take 1.5 tablets (75 mg total) by mouth 3 (three) times daily., Disp: 405 tablet, Rfl: 3 .  loratadine (CLARITIN) 10 MG tablet, Take 10 mg by mouth daily., Disp: , Rfl:  .  Magnesium Oxide 420 MG TABS, Take 840 mg by mouth daily., Disp: , Rfl:  .  metolazone (ZAROXOLYN) 2.5 MG tablet, Take 1 tablet (2.5 mg total) by mouth once a week. Monday, Disp: 12 tablet, Rfl: 0 .  Multiple Vitamin (MULTIVITAMIN) tablet, Take 1 tablet by mouth daily., Disp: , Rfl:  .  omeprazole (PRILOSEC) 40 MG capsule, Take 40 mg by mouth daily., Disp: , Rfl:  .  potassium chloride (K-DUR,KLOR-CON) 10 MEQ tablet, Take 4 tablets (40 mEq total) by mouth 2 (two) times daily., Disp: 240 tablet, Rfl: 11 .  rOPINIRole (REQUIP) 0.5 MG tablet, Take 0.5 mg by mouth at bedtime. , Disp: , Rfl:  .  sertraline (ZOLOFT) 100 MG tablet, Take 150 mg by mouth daily., Disp: , Rfl:  .  simvastatin  (ZOCOR) 20 MG tablet, Take 20 mg by mouth daily., Disp: , Rfl:  .  spironolactone (ALDACTONE) 25 MG tablet, Take 1 tablet (25 mg total) by mouth daily., Disp: 90 tablet, Rfl: 3 .  torsemide (DEMADEX) 20 MG tablet, Take 1 tablet (20 mg total) by mouth 2 (two) times daily., Disp: 120 tablet, Rfl: 3 .  amiodarone (PACERONE) 200 MG tablet, Take 1 tablet (200 mg total) by mouth daily., Disp: 30 tablet, Rfl: 3 .  Calcium Carbonate-Vitamin D 600-400 MG-UNIT tablet, Take by mouth., Disp: , Rfl:  .  Investigational - Study Medication, Take 1 tablet by mouth 2 (two) times daily. Study name: [facility] Heart Failure Study Additional study details: Omecamtiv Mecarbil or Placebo (Patient not taking: Reported on 03/18/2019), Disp: 1 each, Rfl: PRN .  isosorbide mononitrate (IMDUR) 60 MG 24 hr tablet, Take 1 tablet (60 mg total) by mouth daily., Disp: 30 tablet, Rfl: 0 Allergies  Allergen Reactions  . Codeine Itching  . Gabapentin Itching  . Haldol [Haloperidol Lactate] Other (See Comments)    Dizziness, hallucinations  . Levofloxacin     Other reaction(s): Other (See Comments) Projectile vomiting   . Lisinopril Itching and Swelling  . Losartan Itching  . Morphine And Related Nausea And Vomiting  . Penicillins Nausea And Vomiting    "Vomiting, upset stomach, Headache" Has patient had a PCN reaction causing immediate rash, facial/tongue/throat swelling, SOB or lightheadedness with hypotension: No Has patient had a PCN reaction causing severe rash involving mucus membranes or skin necrosis: No Has patient had a PCN reaction that required hospitalization: No Has patient had a PCN reaction occurring within the last 10 years: No If all of the above answers are "NO", then may proceed with Cephalosporin use.       Social History   Socioeconomic History  . Marital status: Single    Spouse name: Not on file  . Number of children: Not on file  . Years of education: Not on file  . Highest education level:  Not on file  Occupational History  . Not on file  Social Needs  . Financial resource strain: Not on file  . Food insecurity    Worry: Not on file    Inability: Not on file  . Transportation needs    Medical: Not on file    Non-medical: Not on file  Tobacco Use  . Smoking status: Never Smoker  . Smokeless tobacco: Never Used  Substance and Sexual Activity  . Alcohol use: No  . Drug use: No  . Sexual activity: Not on file  Lifestyle  . Physical activity    Days per week: Not on file    Minutes per session: Not on file  . Stress: Not on file  Relationships  . Social Herbalist on phone: Not on file    Gets together: Not on file    Attends religious service: Not on file    Active member of club or organization: Not on file    Attends meetings  of clubs or organizations: Not on file    Relationship status: Not on file  . Intimate partner violence    Fear of current or ex partner: Not on file    Emotionally abused: Not on file    Physically abused: Not on file    Forced sexual activity: Not on file  Other Topics Concern  . Not on file  Social History Narrative  . Not on file    Physical Exam Cardiovascular:     Rate and Rhythm: Normal rate and regular rhythm.     Pulses: Normal pulses.  Pulmonary:     Effort: Pulmonary effort is normal.  Abdominal:     General: There is no distension.  Musculoskeletal: Normal range of motion.     Right lower leg: No edema.     Left lower leg: No edema.  Skin:    General: Skin is warm and dry.     Capillary Refill: Capillary refill takes less than 2 seconds.  Neurological:     Mental Status: She is alert and oriented to person, place, and time.  Psychiatric:        Mood and Affect: Mood normal.         No future appointments.  BP 102/60 (BP Location: Left Arm, Patient Position: Sitting, Cuff Size: Normal)   Pulse 84   Resp 18   Wt 185 lb 6.4 oz (84.1 kg)   SpO2 98%   BMI 32.84 kg/m   Weight yesterday- Did  not weigh Last visit weight- 189.4 lb  Michelle Andrade was seen at home today and reported feeling well. She denied chest pain, SOB, headache, dizziness, orthopnea, cough or fever since our last visit. She stated she has been compliant with her medications over the past week and her weight has been stable. Her medications were verified and her pillbox was refilled. I will follow up in two weeks.   [name], EMT 04/15/19  ACTION: Home visit completed Next visit planned for 2 weeks

## 2019-04-29 ENCOUNTER — Other Ambulatory Visit (HOSPITAL_COMMUNITY): Payer: Self-pay

## 2019-04-29 NOTE — Progress Notes (Signed)
Paramedicine Encounter    Patient ID: Michelle Andrade, female    DOB: Jan 20, 1961, 58 y.o.   MRN: [id number]   Patient Care Team: Henderson Baltimore, [name] as PCP - General (Nurse Practitioner) Jorge Ny, LCSW as Social Worker (Licensed Clinical Social Worker)  Patient Active Problem List   Diagnosis Date Noted  . Secondary hyperparathyroidism of renal origin ([place]) 03/20/2018  . Severe obesity (BMI 35.0-39.9) with comorbidity ([place]) 12/19/2017  . CKD (chronic kidney disease) stage 4, GFR 15-29 ml/min (HCC) 06/07/2017  . Acute on chronic systolic (congestive) heart failure ([place])   . CHF (congestive heart failure) ([place]) 02/09/2017  . Pulmonary vascular congestion 02/09/2017  . Hypertension   . Sleep apnea   . Chondrocalcinosis of both knees 02/08/2017  . Primary osteoarthritis of both knees 02/08/2017  . Type 2 diabetes mellitus ([place]) 11/16/2016  . Borderline personality disorder ([place]) 10/17/2016  . Carpal tunnel syndrome 10/17/2016  . 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 Eczema 07/03/2016  . Atrial tachycardia ([place]) 05/31/2016  . 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 Onychomycosis of toenail 06/08/2015  . Cardiomyopathy ([place]) 06/07/2015  . Irritable bowel syndrome 09/27/2014  . PAD (peripheral artery disease) ([place]) 01/12/2014  . History of DVT (deep vein thrombosis) 01/12/2014  . Acute gout of right knee 12/29/2013  . Iron deficiency anemia 12/29/2013  . Lichenification and lichen simplex chronicus 12/26/2013  . Hyperlipidemia associated with type 2 diabetes mellitus ([place]) 11/10/2013  . Benign hypertension with chronic kidney disease, stage IV ([place]) 11/10/2013  . Disorder of magnesium metabolism 10/13/2013  . CAD in native artery 09/01/2013  . Anxiety state 07/27/2013  . Depressive disorder 07/27/2013  . Gastro-esophageal reflux disease without esophagitis 07/27/2013  . Low back  pain 06/03/2013  . Mitral valve disease 06/03/2013  . Pulmonary valve disorder 06/03/2013  . Tricuspid valve disorder 06/03/2013  . 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 Allergic rhinitis 02/07/2013  . 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 Pain in joint 02/07/2013    Current Outpatient Medications:  .  amiodarone (PACERONE) 200 MG tablet, Take 1 tablet (200 mg total) by mouth daily., Disp: 30 tablet, Rfl: 3 .  aspirin 81 MG chewable tablet, Chew 81 mg by mouth daily., Disp: , Rfl:  .  Calcium Carbonate-Vitamin D3 (CALCIUM 600/VITAMIN D) 600-400 MG-UNIT TABS, Take 1 tablet by mouth 2 (two) times daily. , Disp: , Rfl:  .  carvedilol (COREG) 25 MG tablet, Take 12.5 mg by mouth 2 (two) times daily with a meal., Disp: , Rfl:  .  diclofenac sodium (VOLTAREN) 1 % GEL, Apply 2 g topically 4 (four) times daily., Disp: 100 g, Rfl: 0 .  ferrous sulfate 325 (65 FE) MG tablet, Take 325 mg by mouth 3 (three) times daily. , Disp: , Rfl:  .  glipiZIDE (GLUCOTROL) 5 MG tablet, Take 2.5 mg by mouth daily before breakfast., Disp: , Rfl:  .  hydrALAZINE (APRESOLINE) 50 MG tablet, Take 1.5 tablets (75 mg total) by mouth 3 (three) times daily., Disp: 405 tablet, Rfl: 3 .  isosorbide mononitrate (IMDUR) 60 MG 24 hr tablet, Take 1 tablet (60 mg total) by mouth daily., Disp: 30 tablet, Rfl: 0 .  loratadine (CLARITIN) 10 MG tablet, Take 10 mg by mouth daily., Disp: , Rfl:  .  Magnesium Oxide 420 MG TABS, Take 840 mg by mouth daily., Disp: , Rfl:  .  metolazone (ZAROXOLYN) 2.5 MG tablet, Take 1 tablet (2.5 mg total)  by mouth once a week. Monday, Disp: 12 tablet, Rfl: 0 .  Multiple Vitamin (MULTIVITAMIN) tablet, Take 1 tablet by mouth daily., Disp: , Rfl:  .  omeprazole (PRILOSEC) 40 MG capsule, Take 40 mg by mouth daily., Disp: , Rfl:  .  potassium chloride (K-DUR,KLOR-CON) 10 MEQ tablet, Take 4 tablets (40 mEq total) by mouth 2 (two) times daily., Disp: 240 tablet, Rfl: 11 .  rOPINIRole (REQUIP) 0.5 MG tablet,  Take 0.5 mg by mouth at bedtime. , Disp: , Rfl:  .  sertraline (ZOLOFT) 100 MG tablet, Take 150 mg by mouth daily., Disp: , Rfl:  .  simvastatin (ZOCOR) 20 MG tablet, Take 20 mg by mouth daily., Disp: , Rfl:  .  spironolactone (ALDACTONE) 25 MG tablet, Take 1 tablet (25 mg total) by mouth daily., Disp: 90 tablet, Rfl: 3 .  torsemide (DEMADEX) 20 MG tablet, Take 1 tablet (20 mg total) by mouth 2 (two) times daily., Disp: 120 tablet, Rfl: 3 .  Calcium Carbonate-Vitamin D 600-400 MG-UNIT tablet, Take by mouth., Disp: , Rfl:  .  colchicine 0.6 MG tablet, Take 2 tablets now and 1 an hour later, Disp: , Rfl:  .  Investigational - Study Medication, Take 1 tablet by mouth 2 (two) times daily. Study name: [facility] Heart Failure Study Additional study details: Omecamtiv Mecarbil or Placebo (Patient not taking: Reported on 03/18/2019), Disp: 1 each, Rfl: PRN Allergies  Allergen Reactions  . Codeine Itching  . Gabapentin Itching  . Haldol [Haloperidol Lactate] Other (See Comments)    Dizziness, hallucinations  . Levofloxacin     Other reaction(s): Other (See Comments) Projectile vomiting   . Lisinopril Itching and Swelling  . Losartan Itching  . Morphine And Related Nausea And Vomiting  . Penicillins Nausea And Vomiting    "Vomiting, upset stomach, Headache" Has patient had a PCN reaction causing immediate rash, facial/tongue/throat swelling, SOB or lightheadedness with hypotension: No Has patient had a PCN reaction causing severe rash involving mucus membranes or skin necrosis: No Has patient had a PCN reaction that required hospitalization: No Has patient had a PCN reaction occurring within the last 10 years: No If all of the above answers are "NO", then may proceed with Cephalosporin use.       Social History   Socioeconomic History  . Marital status: Single    Spouse name: Not on file  . Number of children: Not on file  . Years of education: Not on file  . Highest education level: Not  on file  Occupational History  . Not on file  Social Needs  . Financial resource strain: Not on file  . Food insecurity    Worry: Not on file    Inability: Not on file  . Transportation needs    Medical: Not on file    Non-medical: Not on file  Tobacco Use  . Smoking status: Never Smoker  . Smokeless tobacco: Never Used  Substance and Sexual Activity  . Alcohol use: No  . Drug use: No  . Sexual activity: Not on file  Lifestyle  . Physical activity    Days per week: Not on file    Minutes per session: Not on file  . Stress: Not on file  Relationships  . Social Herbalist on phone: Not on file    Gets together: Not on file    Attends religious service: Not on file    Active member of club or organization: Not on file  Attends meetings of clubs or organizations: Not on file    Relationship status: Not on file  . Intimate partner violence    Fear of current or ex partner: Not on file    Emotionally abused: Not on file    Physically abused: Not on file    Forced sexual activity: Not on file  Other Topics Concern  . Not on file  Social History Narrative  . Not on file    Physical Exam Cardiovascular:     Rate and Rhythm: Normal rate and regular rhythm.     Pulses: Normal pulses.  Pulmonary:     Effort: Pulmonary effort is normal.     Breath sounds: Normal breath sounds.  Abdominal:     General: Abdomen is flat. There is no distension.  Musculoskeletal: Normal range of motion.     Right lower leg: No edema.     Left lower leg: No edema.  Skin:    General: Skin is warm and dry.     Capillary Refill: Capillary refill takes less than 2 seconds.  Neurological:     Mental Status: She is alert and oriented to person, place, and time.  Psychiatric:        Mood and Affect: Mood normal.         No future appointments.  BP (!) 95/54 (BP Location: Left Arm, Patient Position: Sitting, Cuff Size: Normal)   Pulse 80   Resp 16   Wt 189 lb 3.2 oz (85.8  kg)   SpO2 98%   BMI 33.52 kg/m   Weight yesterday- 192.2 lb Last visit weight- 185.4 lb  Ms Delbert was seen at home today and she reported feeling well. She denied chest pain, SOB, headache, dizziness, orthopnea, cough or fever over the past two weeks. He medications were verified and her pillbox was refilled.  Will follow up in two weeks.   [name], EMT 04/29/19  ACTION: Home visit completed Next visit planned for 1 week

## 2019-05-12 ENCOUNTER — Telehealth (HOSPITAL_COMMUNITY): Payer: Self-pay

## 2019-05-12 NOTE — Telephone Encounter (Signed)
I called Michelle Andrade to schedule an appointment. She did not answer so I left a message requesting she call me back when she is available.

## 2019-05-13 ENCOUNTER — Other Ambulatory Visit (HOSPITAL_COMMUNITY): Payer: Self-pay

## 2019-05-13 NOTE — Progress Notes (Signed)
Paramedicine Encounter    Patient ID: Michelle Andrade, female    DOB: 12-31-1960, 58 y.o.   MRN: [id number]   Patient Care Team: Henderson Baltimore, [name] as PCP - General (Nurse Practitioner) Jorge Ny, LCSW as Social Worker (Licensed Clinical Social Worker)  Patient Active Problem List   Diagnosis Date Noted  . Secondary hyperparathyroidism of renal origin ([place]) 03/20/2018  . Severe obesity (BMI 35.0-39.9) with comorbidity ([place]) 12/19/2017  . CKD (chronic kidney disease) stage 4, GFR 15-29 ml/min (HCC) 06/07/2017  . Acute on chronic systolic (congestive) Michelle failure ([place])   . CHF (congestive Michelle failure) ([place]) 02/09/2017  . Pulmonary vascular congestion 02/09/2017  . Hypertension   . Sleep apnea   . Chondrocalcinosis of both knees 02/08/2017  . Primary osteoarthritis of both knees 02/08/2017  . Type 2 diabetes mellitus ([place]) 11/16/2016  . Borderline personality disorder ([place]) 10/17/2016  . Carpal tunnel syndrome 10/17/2016  . 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 Eczema 07/03/2016  . Atrial tachycardia ([place]) 05/31/2016  . 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 Onychomycosis of toenail 06/08/2015  . Cardiomyopathy ([place]) 06/07/2015  . Irritable bowel syndrome 09/27/2014  . PAD (peripheral artery disease) ([place]) 01/12/2014  . History of DVT (deep vein thrombosis) 01/12/2014  . Acute gout of right knee 12/29/2013  . Iron deficiency anemia 12/29/2013  . Lichenification and lichen simplex chronicus 12/26/2013  . Hyperlipidemia associated with type 2 diabetes mellitus ([place]) 11/10/2013  . Benign hypertension with chronic kidney disease, stage IV ([place]) 11/10/2013  . Disorder of magnesium metabolism 10/13/2013  . CAD in native artery 09/01/2013  . Anxiety state 07/27/2013  . Depressive disorder 07/27/2013  . Gastro-esophageal reflux disease without esophagitis 07/27/2013  . Low back  pain 06/03/2013  . Mitral valve disease 06/03/2013  . Pulmonary valve disorder 06/03/2013  . Tricuspid valve disorder 06/03/2013  . 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 Allergic rhinitis 02/07/2013  . 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 Pain in joint 02/07/2013    Current Outpatient Medications:  .  amiodarone (PACERONE) 200 MG tablet, Take 1 tablet (200 mg total) by mouth daily., Disp: 30 tablet, Rfl: 3 .  aspirin 81 MG chewable tablet, Chew 81 mg by mouth daily., Disp: , Rfl:  .  Calcium Carbonate-Vitamin D3 (CALCIUM 600/VITAMIN D) 600-400 MG-UNIT TABS, Take 1 tablet by mouth 2 (two) times daily. , Disp: , Rfl:  .  carvedilol (COREG) 25 MG tablet, Take 12.5 mg by mouth 2 (two) times daily with a meal., Disp: , Rfl:  .  colchicine 0.6 MG tablet, Take 2 tablets now and 1 an hour later, Disp: , Rfl:  .  diclofenac sodium (VOLTAREN) 1 % GEL, Apply 2 g topically 4 (four) times daily., Disp: 100 g, Rfl: 0 .  ferrous sulfate 325 (65 FE) MG tablet, Take 325 mg by mouth 3 (three) times daily. , Disp: , Rfl:  .  glipiZIDE (GLUCOTROL) 5 MG tablet, Take 2.5 mg by mouth daily before breakfast., Disp: , Rfl:  .  hydrALAZINE (APRESOLINE) 50 MG tablet, Take 1.5 tablets (75 mg total) by mouth 3 (three) times daily., Disp: 405 tablet, Rfl: 3 .  isosorbide mononitrate (IMDUR) 60 MG 24 hr tablet, Take 1 tablet (60 mg total) by mouth daily., Disp: 30 tablet, Rfl: 0 .  loratadine (CLARITIN) 10 MG tablet, Take 10 mg by mouth daily., Disp: , Rfl:  .  Magnesium Oxide 420 MG TABS, Take 840 mg by  mouth daily., Disp: , Rfl:  .  metolazone (ZAROXOLYN) 2.5 MG tablet, Take 1 tablet (2.5 mg total) by mouth once a week. Monday, Disp: 12 tablet, Rfl: 0 .  Multiple Vitamin (MULTIVITAMIN) tablet, Take 1 tablet by mouth daily., Disp: , Rfl:  .  omeprazole (PRILOSEC) 40 MG capsule, Take 40 mg by mouth daily., Disp: , Rfl:  .  potassium chloride (K-DUR,KLOR-CON) 10 MEQ tablet, Take 4 tablets (40 mEq total) by mouth 2  (two) times daily., Disp: 240 tablet, Rfl: 11 .  rOPINIRole (REQUIP) 0.5 MG tablet, Take 0.5 mg by mouth at bedtime. , Disp: , Rfl:  .  sertraline (ZOLOFT) 100 MG tablet, Take 150 mg by mouth daily., Disp: , Rfl:  .  simvastatin (ZOCOR) 20 MG tablet, Take 20 mg by mouth daily., Disp: , Rfl:  .  spironolactone (ALDACTONE) 25 MG tablet, Take 1 tablet (25 mg total) by mouth daily., Disp: 90 tablet, Rfl: 3 .  torsemide (DEMADEX) 20 MG tablet, Take 1 tablet (20 mg total) by mouth 2 (two) times daily., Disp: 120 tablet, Rfl: 3 .  Calcium Carbonate-Vitamin D 600-400 MG-UNIT tablet, Take by mouth., Disp: , Rfl:  .  Investigational - Study Medication, Take 1 tablet by mouth 2 (two) times daily. Study name: Galactic Michelle Failure Study Additional study details: Omecamtiv Mecarbil or Placebo (Patient not taking: Reported on 03/18/2019), Disp: 1 each, Rfl: PRN Allergies  Allergen Reactions  . Codeine Itching  . Gabapentin Itching  . Haldol [Haloperidol Lactate] Other (See Comments)    Dizziness, hallucinations  . Levofloxacin     Other reaction(s): Other (See Comments) Projectile vomiting   . Lisinopril Itching and Swelling  . Losartan Itching  . Morphine And Related Nausea And Vomiting  . Penicillins Nausea And Vomiting    "Vomiting, upset stomach, Headache" Has patient had a PCN reaction causing immediate rash, facial/tongue/throat swelling, SOB or lightheadedness with hypotension: No Has patient had a PCN reaction causing severe rash involving mucus membranes or skin necrosis: No Has patient had a PCN reaction that required hospitalization: No Has patient had a PCN reaction occurring within the last 10 years: No If all of the above answers are "NO", then may proceed with Cephalosporin use.       Social History   Socioeconomic History  . Marital status: Single    Spouse name: Not on file  . Number of children: Not on file  . Years of education: Not on file  . Highest education level: Not  on file  Occupational History  . Not on file  Social Needs  . Financial resource strain: Not on file  . Food insecurity    Worry: Not on file    Inability: Not on file  . Transportation needs    Medical: Not on file    Non-medical: Not on file  Tobacco Use  . Smoking status: Never Smoker  . Smokeless tobacco: Never Used  Substance and Sexual Activity  . Alcohol use: No  . Drug use: No  . Sexual activity: Not on file  Lifestyle  . Physical activity    Days per week: Not on file    Minutes per session: Not on file  . Stress: Not on file  Relationships  . Social Herbalist on phone: Not on file    Gets together: Not on file    Attends religious service: Not on file    Active member of club or organization: Not on file  Attends meetings of clubs or organizations: Not on file    Relationship status: Not on file  . Intimate partner violence    Fear of current or ex partner: Not on file    Emotionally abused: Not on file    Physically abused: Not on file    Forced sexual activity: Not on file  Other Topics Concern  . Not on file  Social History Narrative  . Not on file    Physical Exam Cardiovascular:     Rate and Rhythm: Normal rate and regular rhythm.     Pulses: Normal pulses.  Pulmonary:     Effort: Pulmonary effort is normal.     Breath sounds: Normal breath sounds.  Abdominal:     General: There is no distension.  Musculoskeletal: Normal range of motion.     Right lower leg: No edema.     Left lower leg: No edema.  Skin:    General: Skin is warm and dry.     Capillary Refill: Capillary refill takes less than 2 seconds.  Neurological:     Mental Status: She is alert and oriented to person, place, and time.  Psychiatric:        Mood and Affect: Mood normal.         No future appointments.  BP (!) 101/59 (BP Location: Right Arm, Patient Position: Sitting, Cuff Size: Normal)   Pulse 72   Resp 16   Wt 189 lb 9.6 oz (86 kg)   SpO2 98%    BMI 33.59 kg/m   Weight yesterday- did not weigh Last visit weight- 189.2 lb  Michelle Andrade was seen at home today and reported feeling well. She denied chest pain, increased SOB, headache, dizziness, orthopnea, fever or cough since our last visit. She stated she has been compliant with her medications over the past two weeks and her weight has been stable. Her medications were verified and her pillbox was refilled. I will follow up in two weeks.   [name], EMT 05/13/19  ACTION: Home visit completed Next visit planned for 2 weeks

## 2019-05-26 ENCOUNTER — Telehealth (HOSPITAL_COMMUNITY): Payer: Self-pay

## 2019-05-26 NOTE — Telephone Encounter (Signed)
I called [name] to an schedule an appointment. She stated she was available tomorrow morning so we agreed to meet at 10:00.

## 2019-05-27 ENCOUNTER — Other Ambulatory Visit (HOSPITAL_COMMUNITY): Payer: Self-pay

## 2019-05-27 NOTE — Progress Notes (Signed)
Paramedicine Encounter    Patient ID: Michelle Andrade, female    DOB: Jun 20, 1961, 58 y.o.   MRN: [id number]   Patient Care Team: Henderson [place], [name] as PCP - General (Nurse Practitioner) Jorge Ny, LCSW as Social Worker (Licensed Clinical Social Worker)  Patient Active Problem List   Diagnosis Date Noted  . Secondary hyperparathyroidism of renal origin ([place]) 03/20/2018  . Severe obesity (BMI 35.0-39.9) with comorbidity ([place]) 12/19/2017  . CKD (chronic kidney disease) stage 4, GFR 15-29 ml/min (HCC) 06/07/2017  . Acute on chronic systolic (congestive) heart failure ([place])   . CHF (congestive heart failure) ([place]) 02/09/2017  . Pulmonary vascular congestion 02/09/2017  . Hypertension   . Sleep apnea   . Chondrocalcinosis of both knees 02/08/2017  . Primary osteoarthritis of both knees 02/08/2017  . Type 2 diabetes mellitus ([place]) 11/16/2016  . Borderline personality disorder ([place]) 10/17/2016  . Carpal tunnel syndrome 10/17/2016  . 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 Eczema 07/03/2016  . Atrial tachycardia ([place]) 05/31/2016  . 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 Onychomycosis of toenail 06/08/2015  . Cardiomyopathy ([place]) 06/07/2015  . Irritable bowel syndrome 09/27/2014  . PAD (peripheral artery disease) ([place]) 01/12/2014  . History of DVT (deep vein thrombosis) 01/12/2014  . Acute gout of right knee 12/29/2013  . Iron deficiency anemia 12/29/2013  . Lichenification and lichen simplex chronicus 12/26/2013  . Hyperlipidemia associated with type 2 diabetes mellitus ([place]) 11/10/2013  . Benign hypertension with chronic kidney disease, stage IV ([place]) 11/10/2013  . Disorder of magnesium metabolism 10/13/2013  . CAD in native artery 09/01/2013  . Anxiety state 07/27/2013  . Depressive disorder 07/27/2013  . Gastro-esophageal reflux disease without esophagitis 07/27/2013  . Low back  pain 06/03/2013  . Mitral valve disease 06/03/2013  . Pulmonary valve disorder 06/03/2013  . Tricuspid valve disorder 06/03/2013  . 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 Allergic rhinitis 02/07/2013  . 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 Pain in joint 02/07/2013    Current Outpatient Medications:  .  amiodarone (PACERONE) 200 MG tablet, Take 1 tablet (200 mg total) by mouth daily., Disp: 30 tablet, Rfl: 3 .  aspirin 81 MG chewable tablet, Chew 81 mg by mouth daily., Disp: , Rfl:  .  carvedilol (COREG) 25 MG tablet, Take 12.5 mg by mouth 2 (two) times daily with a meal., Disp: , Rfl:  .  colchicine 0.6 MG tablet, Take 2 tablets now and 1 an hour later, Disp: , Rfl:  .  diclofenac sodium (VOLTAREN) 1 % GEL, Apply 2 g topically 4 (four) times daily., Disp: 100 g, Rfl: 0 .  ferrous sulfate 325 (65 FE) MG tablet, Take 325 mg by mouth 3 (three) times daily. , Disp: , Rfl:  .  glipiZIDE (GLUCOTROL) 5 MG tablet, Take 2.5 mg by mouth daily before breakfast., Disp: , Rfl:  .  hydrALAZINE (APRESOLINE) 50 MG tablet, Take 1.5 tablets (75 mg total) by mouth 3 (three) times daily., Disp: 405 tablet, Rfl: 3 .  isosorbide mononitrate (IMDUR) 60 MG 24 hr tablet, Take 1 tablet (60 mg total) by mouth daily., Disp: 30 tablet, Rfl: 0 .  loratadine (CLARITIN) 10 MG tablet, Take 10 mg by mouth daily., Disp: , Rfl:  .  Magnesium Oxide 420 MG TABS, Take 840 mg by mouth daily., Disp: , Rfl:  .  metolazone (ZAROXOLYN) 2.5 MG tablet, Take 1 tablet (2.5 mg total) by mouth once a week. Monday,  Disp: 12 tablet, Rfl: 0 .  Multiple Vitamin (MULTIVITAMIN) tablet, Take 1 tablet by mouth daily., Disp: , Rfl:  .  omeprazole (PRILOSEC) 40 MG capsule, Take 40 mg by mouth daily., Disp: , Rfl:  .  potassium chloride (K-DUR,KLOR-CON) 10 MEQ tablet, Take 4 tablets (40 mEq total) by mouth 2 (two) times daily., Disp: 240 tablet, Rfl: 11 .  rOPINIRole (REQUIP) 0.5 MG tablet, Take 0.5 mg by mouth at bedtime. , Disp: , Rfl:  .   sertraline (ZOLOFT) 100 MG tablet, Take 150 mg by mouth daily., Disp: , Rfl:  .  simvastatin (ZOCOR) 20 MG tablet, Take 20 mg by mouth daily., Disp: , Rfl:  .  spironolactone (ALDACTONE) 25 MG tablet, Take 1 tablet (25 mg total) by mouth daily., Disp: 90 tablet, Rfl: 3 .  torsemide (DEMADEX) 20 MG tablet, Take 1 tablet (20 mg total) by mouth 2 (two) times daily., Disp: 120 tablet, Rfl: 3 .  Calcium Carbonate-Vitamin D 600-400 MG-UNIT tablet, Take by mouth., Disp: , Rfl:  .  Calcium Carbonate-Vitamin D3 (CALCIUM 600/VITAMIN D) 600-400 MG-UNIT TABS, Take 1 tablet by mouth 2 (two) times daily. , Disp: , Rfl:  .  Investigational - Study Medication, Take 1 tablet by mouth 2 (two) times daily. Study name: Galactic Heart Failure Study Additional study details: Omecamtiv Mecarbil or Placebo (Patient not taking: Reported on 03/18/2019), Disp: 1 each, Rfl: PRN Allergies  Allergen Reactions  . Codeine Itching  . Gabapentin Itching  . Haldol [Haloperidol Lactate] Other (See Comments)    Dizziness, hallucinations  . Levofloxacin     Other reaction(s): Other (See Comments) Projectile vomiting   . Lisinopril Itching and Swelling  . Losartan Itching  . Morphine And Related Nausea And Vomiting  . Penicillins Nausea And Vomiting    "Vomiting, upset stomach, Headache" Has patient had a PCN reaction causing immediate rash, facial/tongue/throat swelling, SOB or lightheadedness with hypotension: No Has patient had a PCN reaction causing severe rash involving mucus membranes or skin necrosis: No Has patient had a PCN reaction that required hospitalization: No Has patient had a PCN reaction occurring within the last 10 years: No If all of the above answers are "NO", then may proceed with Cephalosporin use.       Social History   Socioeconomic History  . Marital status: Single    Spouse name: Not on file  . Number of children: Not on file  . Years of education: Not on file  . Highest education level: Not  on file  Occupational History  . Not on file  Social Needs  . Financial resource strain: Not on file  . Food insecurity    Worry: Not on file    Inability: Not on file  . Transportation needs    Medical: Not on file    Non-medical: Not on file  Tobacco Use  . Smoking status: Never Smoker  . Smokeless tobacco: Never Used  Substance and Sexual Activity  . Alcohol use: No  . Drug use: No  . Sexual activity: Not on file  Lifestyle  . Physical activity    Days per week: Not on file    Minutes per session: Not on file  . Stress: Not on file  Relationships  . Social Herbalist on phone: Not on file    Gets together: Not on file    Attends religious service: Not on file    Active member of club or organization: Not on file  Attends meetings of clubs or organizations: Not on file    Relationship status: Not on file  . Intimate partner violence    Fear of current or ex partner: Not on file    Emotionally abused: Not on file    Physically abused: Not on file    Forced sexual activity: Not on file  Other Topics Concern  . Not on file  Social History Narrative  . Not on file    Physical Exam Cardiovascular:     Rate and Rhythm: Normal rate and regular rhythm.     Pulses: Normal pulses.  Pulmonary:     Effort: Pulmonary effort is normal.     Breath sounds: Normal breath sounds.  Abdominal:     General: There is no distension.  Musculoskeletal: Normal range of motion.     Right lower leg: No edema.     Left lower leg: No edema.  Skin:    General: Skin is warm and dry.     Capillary Refill: Capillary refill takes less than 2 seconds.  Neurological:     Mental Status: She is alert and oriented to person, place, and time.  Psychiatric:        Mood and Affect: Mood normal.         No future appointments.  BP 110/68 (BP Location: Left Arm, Patient Position: Sitting, Cuff Size: Normal)   Pulse 80   Resp 18   Wt 188 lb (85.3 kg)   SpO2 98%   BMI 33.30  kg/m   Weight yesterday- 191 lb Last visit weight- 189 lb  Ms Bujak was seen at home today and reported feeling well. She denied chest pain, SOB, headache, increased dizziness, orthopnea, fever or cough since our last visit. She reported being compliant with her medications over the past two weeks and her weight has been stable. Her medications were verified and her pillbox was refilled I will follow up next week to finish her second box after she reeceives her shipment from the [place].    Needed in "Old" box: simvastatin, loratidine, calcium  [name], EMT 05/27/19  ACTION: Home visit completed Next visit planned for next week after medications arrive.

## 2019-06-02 ENCOUNTER — Telehealth (HOSPITAL_COMMUNITY): Payer: Self-pay

## 2019-06-02 NOTE — Telephone Encounter (Signed)
I called Michelle Andrade to schedule an appointment. She stated she would be available tomorrow morning so we agreed to meet at 10:00.

## 2019-06-03 ENCOUNTER — Other Ambulatory Visit (HOSPITAL_COMMUNITY): Payer: Self-pay

## 2019-06-03 NOTE — Progress Notes (Signed)
Paramedicine Encounter    Patient ID: Michelle Andrade, female    DOB: June 12, 1961, 58 y.o.   MRN: [id number]   Patient Care Team: Henderson Baltimore, [name] as PCP - General (Nurse Practitioner) Jorge Ny, LCSW as Social Worker (Licensed Clinical Social Worker)  Patient Active Problem List   Diagnosis Date Noted  . Secondary hyperparathyroidism of renal origin ([place]) 03/20/2018  . Severe obesity (BMI 35.0-39.9) with comorbidity ([place]) 12/19/2017  . CKD (chronic kidney disease) stage 4, GFR 15-29 ml/min (HCC) 06/07/2017  . Acute on chronic systolic (congestive) heart failure ([place])   . CHF (congestive heart failure) ([place]) 02/09/2017  . Pulmonary vascular congestion 02/09/2017  . Hypertension   . Sleep apnea   . Chondrocalcinosis of both knees 02/08/2017  . Primary osteoarthritis of both knees 02/08/2017  . Type 2 diabetes mellitus ([place]) 11/16/2016  . Borderline personality disorder ([place]) 10/17/2016  . Carpal tunnel syndrome 10/17/2016  . 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 Eczema 07/03/2016  . Atrial tachycardia ([place]) 05/31/2016  . 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 Onychomycosis of toenail 06/08/2015  . Cardiomyopathy ([place]) 06/07/2015  . Irritable bowel syndrome 09/27/2014  . PAD (peripheral artery disease) ([place]) 01/12/2014  . History of DVT (deep vein thrombosis) 01/12/2014  . Acute gout of right knee 12/29/2013  . Iron deficiency anemia 12/29/2013  . Lichenification and lichen simplex chronicus 12/26/2013  . Hyperlipidemia associated with type 2 diabetes mellitus ([place]) 11/10/2013  . Benign hypertension with chronic kidney disease, stage IV ([place]) 11/10/2013  . Disorder of magnesium metabolism 10/13/2013  . CAD in native artery 09/01/2013  . Anxiety state 07/27/2013  . Depressive disorder 07/27/2013  . Gastro-esophageal reflux disease without esophagitis 07/27/2013  . Low back  pain 06/03/2013  . Mitral valve disease 06/03/2013  . Pulmonary valve disorder 06/03/2013  . Tricuspid valve disorder 06/03/2013  . 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 Allergic rhinitis 02/07/2013  . 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 Pain in joint 02/07/2013    Current Outpatient Medications:  .  amiodarone (PACERONE) 200 MG tablet, Take 1 tablet (200 mg total) by mouth daily., Disp: 30 tablet, Rfl: 3 .  aspirin 81 MG chewable tablet, Chew 81 mg by mouth daily., Disp: , Rfl:  .  Calcium Carbonate-Vitamin D3 (CALCIUM 600/VITAMIN D) 600-400 MG-UNIT TABS, Take 1 tablet by mouth 2 (two) times daily. , Disp: , Rfl:  .  carvedilol (COREG) 25 MG tablet, Take 12.5 mg by mouth 2 (two) times daily with a meal., Disp: , Rfl:  .  diclofenac sodium (VOLTAREN) 1 % GEL, Apply 2 g topically 4 (four) times daily., Disp: 100 g, Rfl: 0 .  ferrous sulfate 325 (65 FE) MG tablet, Take 325 mg by mouth 3 (three) times daily. , Disp: , Rfl:  .  glipiZIDE (GLUCOTROL) 5 MG tablet, Take 2.5 mg by mouth daily before breakfast., Disp: , Rfl:  .  hydrALAZINE (APRESOLINE) 50 MG tablet, Take 1.5 tablets (75 mg total) by mouth 3 (three) times daily., Disp: 405 tablet, Rfl: 3 .  isosorbide mononitrate (IMDUR) 60 MG 24 hr tablet, Take 1 tablet (60 mg total) by mouth daily., Disp: 30 tablet, Rfl: 0 .  Magnesium Oxide 420 MG TABS, Take 840 mg by mouth daily., Disp: , Rfl:  .  metolazone (ZAROXOLYN) 2.5 MG tablet, Take 1 tablet (2.5 mg total) by mouth once a week. Monday, Disp: 12 tablet, Rfl: 0 .  Multiple Vitamin (MULTIVITAMIN) tablet,  Take 1 tablet by mouth daily., Disp: , Rfl:  .  omeprazole (PRILOSEC) 40 MG capsule, Take 40 mg by mouth daily., Disp: , Rfl:  .  potassium chloride (K-DUR,KLOR-CON) 10 MEQ tablet, Take 4 tablets (40 mEq total) by mouth 2 (two) times daily., Disp: 240 tablet, Rfl: 11 .  rOPINIRole (REQUIP) 0.5 MG tablet, Take 0.5 mg by mouth at bedtime. , Disp: , Rfl:  .  sertraline (ZOLOFT) 100 MG  tablet, Take 150 mg by mouth daily., Disp: , Rfl:  .  simvastatin (ZOCOR) 20 MG tablet, Take 20 mg by mouth daily., Disp: , Rfl:  .  spironolactone (ALDACTONE) 25 MG tablet, Take 1 tablet (25 mg total) by mouth daily., Disp: 90 tablet, Rfl: 3 .  torsemide (DEMADEX) 20 MG tablet, Take 1 tablet (20 mg total) by mouth 2 (two) times daily., Disp: 120 tablet, Rfl: 3 .  Calcium Carbonate-Vitamin D 600-400 MG-UNIT tablet, Take by mouth., Disp: , Rfl:  .  colchicine 0.6 MG tablet, Take 2 tablets now and 1 an hour later, Disp: , Rfl:  .  Investigational - Study Medication, Take 1 tablet by mouth 2 (two) times daily. Study name: [facility] Heart Failure Study Additional study details: Omecamtiv Mecarbil or Placebo (Patient not taking: Reported on 03/18/2019), Disp: 1 each, Rfl: PRN .  loratadine (CLARITIN) 10 MG tablet, Take 10 mg by mouth daily., Disp: , Rfl:  Allergies  Allergen Reactions  . Codeine Itching  . Gabapentin Itching  . Haldol [Haloperidol Lactate] Other (See Comments)    Dizziness, hallucinations  . Levofloxacin     Other reaction(s): Other (See Comments) Projectile vomiting   . Lisinopril Itching and Swelling  . Losartan Itching  . Morphine And Related Nausea And Vomiting  . Penicillins Nausea And Vomiting    "Vomiting, upset stomach, Headache" Has patient had a PCN reaction causing immediate rash, facial/tongue/throat swelling, SOB or lightheadedness with hypotension: No Has patient had a PCN reaction causing severe rash involving mucus membranes or skin necrosis: No Has patient had a PCN reaction that required hospitalization: No Has patient had a PCN reaction occurring within the last 10 years: No If all of the above answers are "NO", then may proceed with Cephalosporin use.       Social History   Socioeconomic History  . Marital status: Single    Spouse name: Not on file  . Number of children: Not on file  . Years of education: Not on file  . Highest education level: Not  on file  Occupational History  . Not on file  Social Needs  . Financial resource strain: Not on file  . Food insecurity    Worry: Not on file    Inability: Not on file  . Transportation needs    Medical: Not on file    Non-medical: Not on file  Tobacco Use  . Smoking status: Never Smoker  . Smokeless tobacco: Never Used  Substance and Sexual Activity  . Alcohol use: No  . Drug use: No  . Sexual activity: Not on file  Lifestyle  . Physical activity    Days per week: Not on file    Minutes per session: Not on file  . Stress: Not on file  Relationships  . Social Herbalist on phone: Not on file    Gets together: Not on file    Attends religious service: Not on file    Active member of club or organization: Not on file  Attends meetings of clubs or organizations: Not on file    Relationship status: Not on file  . Intimate partner violence    Fear of current or ex partner: Not on file    Emotionally abused: Not on file    Physically abused: Not on file    Forced sexual activity: Not on file  Other Topics Concern  . Not on file  Social History Narrative  . Not on file    Physical Exam Cardiovascular:     Rate and Rhythm: Normal rate and regular rhythm.     Pulses: Normal pulses.  Pulmonary:     Effort: Pulmonary effort is normal.     Breath sounds: Normal breath sounds.  Musculoskeletal: Normal range of motion.     Right lower leg: No edema.     Left lower leg: No edema.  Skin:    General: Skin is warm and dry.     Capillary Refill: Capillary refill takes less than 2 seconds.  Neurological:     Mental Status: She is alert and oriented to person, place, and time.  Psychiatric:        Mood and Affect: Mood normal.         No future appointments.  BP 110/70 (BP Location: Left Arm, Patient Position: Sitting, Cuff Size: Normal)   Pulse 78   Resp 16   Wt 188 lb 3.2 oz (85.4 kg)   SpO2 96%   BMI 33.34 kg/m   Weight yesterday- Did not  weigh Last visit weight- 188 lb  Michelle Andrade was seen at home today and reported feeling well. She denied chest pain, SOB, headache, orthopnea, fever or cough since our last visit. She stated she has been compliant with her medications over the past week and her weight has been stable. Her medications were verified and her pillbox was refilled. I marked her pillbox where the medications she is lacking should be placed (ferrous sulfate and simvastatin). I will follow up in two weeks.   [name], EMT 06/03/19  ACTION: Home visit completed Next visit planned for 2 weeks

## 2019-06-17 ENCOUNTER — Other Ambulatory Visit (HOSPITAL_COMMUNITY): Payer: Self-pay

## 2019-06-17 NOTE — Progress Notes (Signed)
Paramedicine Encounter    Patient ID: Michelle Andrade, female    DOB: 1961/06/24, 58 y.o.   MRN: [id number]   Patient Care Team: Henderson Baltimore, [name] as PCP - General (Nurse Practitioner) Jorge Ny, LCSW as Social Worker (Licensed Clinical Social Worker)  Patient Active Problem List   Diagnosis Date Noted  . Secondary hyperparathyroidism of renal origin ([place]) 03/20/2018  . Severe obesity (BMI 35.0-39.9) with comorbidity ([place]) 12/19/2017  . CKD (chronic kidney disease) stage 4, GFR 15-29 ml/min (HCC) 06/07/2017  . Acute on chronic systolic (congestive) heart failure ([place])   . CHF (congestive heart failure) ([place]) 02/09/2017  . Pulmonary vascular congestion 02/09/2017  . Hypertension   . Sleep apnea   . Chondrocalcinosis of both knees 02/08/2017  . Primary osteoarthritis of both knees 02/08/2017  . Type 2 diabetes mellitus ([place]) 11/16/2016  . Borderline personality disorder ([place]) 10/17/2016  . Carpal tunnel syndrome 10/17/2016  . 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 Eczema 07/03/2016  . Atrial tachycardia ([place]) 05/31/2016  . 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 Onychomycosis of toenail 06/08/2015  . Cardiomyopathy ([place]) 06/07/2015  . Irritable bowel syndrome 09/27/2014  . PAD (peripheral artery disease) ([place]) 01/12/2014  . History of DVT (deep vein thrombosis) 01/12/2014  . Acute gout of right knee 12/29/2013  . Iron deficiency anemia 12/29/2013  . Lichenification and lichen simplex chronicus 12/26/2013  . Hyperlipidemia associated with type 2 diabetes mellitus ([place]) 11/10/2013  . Benign hypertension with chronic kidney disease, stage IV ([place]) 11/10/2013  . Disorder of magnesium metabolism 10/13/2013  . CAD in native artery 09/01/2013  . Anxiety state 07/27/2013  . Depressive disorder 07/27/2013  . Gastro-esophageal reflux disease without esophagitis 07/27/2013  . Low back  pain 06/03/2013  . Mitral valve disease 06/03/2013  . Pulmonary valve disorder 06/03/2013  . Tricuspid valve disorder 06/03/2013  . 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 Allergic rhinitis 02/07/2013  . 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 Pain in joint 02/07/2013    Current Outpatient Medications:  .  amiodarone (PACERONE) 200 MG tablet, Take 1 tablet (200 mg total) by mouth daily., Disp: 30 tablet, Rfl: 3 .  aspirin 81 MG chewable tablet, Chew 81 mg by mouth daily., Disp: , Rfl:  .  Calcium Carbonate-Vitamin D3 (CALCIUM 600/VITAMIN D) 600-400 MG-UNIT TABS, Take 1 tablet by mouth 2 (two) times daily. , Disp: , Rfl:  .  carvedilol (COREG) 25 MG tablet, Take 12.5 mg by mouth 2 (two) times daily with a meal., Disp: , Rfl:  .  colchicine 0.6 MG tablet, Take 2 tablets now and 1 an hour later, Disp: , Rfl:  .  diclofenac sodium (VOLTAREN) 1 % GEL, Apply 2 g topically 4 (four) times daily., Disp: 100 g, Rfl: 0 .  ferrous sulfate 325 (65 FE) MG tablet, Take 325 mg by mouth 3 (three) times daily. , Disp: , Rfl:  .  glipiZIDE (GLUCOTROL) 5 MG tablet, Take 2.5 mg by mouth daily before breakfast., Disp: , Rfl:  .  hydrALAZINE (APRESOLINE) 50 MG tablet, Take 1.5 tablets (75 mg total) by mouth 3 (three) times daily., Disp: 405 tablet, Rfl: 3 .  isosorbide mononitrate (IMDUR) 60 MG 24 hr tablet, Take 1 tablet (60 mg total) by mouth daily., Disp: 30 tablet, Rfl: 0 .  Magnesium Oxide 420 MG TABS, Take 840 mg by mouth daily., Disp: , Rfl:  .  metolazone (ZAROXOLYN) 2.5 MG tablet, Take 1 tablet (2.5  mg total) by mouth once a week. Monday, Disp: 12 tablet, Rfl: 0 .  Multiple Vitamin (MULTIVITAMIN) tablet, Take 1 tablet by mouth daily., Disp: , Rfl:  .  omeprazole (PRILOSEC) 40 MG capsule, Take 40 mg by mouth daily., Disp: , Rfl:  .  potassium chloride (K-DUR,KLOR-CON) 10 MEQ tablet, Take 4 tablets (40 mEq total) by mouth 2 (two) times daily., Disp: 240 tablet, Rfl: 11 .  rOPINIRole (REQUIP) 0.5 MG tablet,  Take 0.5 mg by mouth at bedtime. , Disp: , Rfl:  .  sertraline (ZOLOFT) 100 MG tablet, Take 150 mg by mouth daily., Disp: , Rfl:  .  simvastatin (ZOCOR) 20 MG tablet, Take 20 mg by mouth daily., Disp: , Rfl:  .  spironolactone (ALDACTONE) 25 MG tablet, Take 1 tablet (25 mg total) by mouth daily., Disp: 90 tablet, Rfl: 3 .  torsemide (DEMADEX) 20 MG tablet, Take 1 tablet (20 mg total) by mouth 2 (two) times daily., Disp: 120 tablet, Rfl: 3 .  Calcium Carbonate-Vitamin D 600-400 MG-UNIT tablet, Take by mouth., Disp: , Rfl:  .  Investigational - Study Medication, Take 1 tablet by mouth 2 (two) times daily. Study name: [facility] Heart Failure Study Additional study details: Omecamtiv Mecarbil or Placebo (Patient not taking: Reported on 03/18/2019), Disp: 1 each, Rfl: PRN .  loratadine (CLARITIN) 10 MG tablet, Take 10 mg by mouth daily., Disp: , Rfl:  Allergies  Allergen Reactions  . Codeine Itching  . Gabapentin Itching  . Haldol [Haloperidol Lactate] Other (See Comments)    Dizziness, hallucinations  . Levofloxacin     Other reaction(s): Other (See Comments) Projectile vomiting   . Lisinopril Itching and Swelling  . Losartan Itching  . Morphine And Related Nausea And Vomiting  . Penicillins Nausea And Vomiting    "Vomiting, upset stomach, Headache" Has patient had a PCN reaction causing immediate rash, facial/tongue/throat swelling, SOB or lightheadedness with hypotension: No Has patient had a PCN reaction causing severe rash involving mucus membranes or skin necrosis: No Has patient had a PCN reaction that required hospitalization: No Has patient had a PCN reaction occurring within the last 10 years: No If all of the above answers are "NO", then may proceed with Cephalosporin use.       Social History   Socioeconomic History  . Marital status: Single    Spouse name: Not on file  . Number of children: Not on file  . Years of education: Not on file  . Highest education level: Not on  file  Occupational History  . Not on file  Social Needs  . Financial resource strain: Not on file  . Food insecurity    Worry: Not on file    Inability: Not on file  . Transportation needs    Medical: Not on file    Non-medical: Not on file  Tobacco Use  . Smoking status: Never Smoker  . Smokeless tobacco: Never Used  Substance and Sexual Activity  . Alcohol use: No  . Drug use: No  . Sexual activity: Not on file  Lifestyle  . Physical activity    Days per week: Not on file    Minutes per session: Not on file  . Stress: Not on file  Relationships  . Social Herbalist on phone: Not on file    Gets together: Not on file    Attends religious service: Not on file    Active member of club or organization: Not on file  Attends meetings of clubs or organizations: Not on file    Relationship status: Not on file  . Intimate partner violence    Fear of current or ex partner: Not on file    Emotionally abused: Not on file    Physically abused: Not on file    Forced sexual activity: Not on file  Other Topics Concern  . Not on file  Social History Narrative  . Not on file    Physical Exam Cardiovascular:     Rate and Rhythm: Normal rate and regular rhythm.     Pulses: Normal pulses.  Pulmonary:     Effort: Pulmonary effort is normal.     Breath sounds: Normal breath sounds.  Abdominal:     General: There is no distension.  Musculoskeletal: Normal range of motion.     Right lower leg: No edema.     Left lower leg: No edema.  Skin:    General: Skin is warm and dry.     Capillary Refill: Capillary refill takes less than 2 seconds.  Neurological:     Mental Status: She is alert and oriented to person, place, and time.  Psychiatric:        Mood and Affect: Mood normal.         No future appointments.  BP 124/72 (BP Location: Left Arm, Patient Position: Sitting, Cuff Size: Normal)   Pulse 90   Resp 16   Wt 188 lb 3.2 oz (85.4 kg)   SpO2 97%   BMI  33.34 kg/m   Weight yesterday- Did not weigh Last visit weight- 188 lb  Ms Bossom was seen at home today and reported feeling well. She denied chest pain, SOB, headache, dizziness, orthopnea, fever or cough since our last visit. She stated she has been compliant with her medications over the past week and her weight has been stable. Her medications were verified and her pillboxes were refilled. I will follow up in two weeks.   [name], EMT 06/17/19  ACTION: Home visit completed Next visit planned for 1 week

## 2019-06-23 ENCOUNTER — Emergency Department (HOSPITAL_BASED_OUTPATIENT_CLINIC_OR_DEPARTMENT_OTHER): Payer: Medicare Other

## 2019-06-23 ENCOUNTER — Encounter (HOSPITAL_BASED_OUTPATIENT_CLINIC_OR_DEPARTMENT_OTHER): Payer: Self-pay | Admitting: *Deleted

## 2019-06-23 ENCOUNTER — Other Ambulatory Visit: Payer: Self-pay

## 2019-06-23 ENCOUNTER — Emergency Department (HOSPITAL_BASED_OUTPATIENT_CLINIC_OR_DEPARTMENT_OTHER)
Admission: EM | Admit: 2019-06-23 | Discharge: 2019-06-23 | Disposition: A | Discharge status: Home / Self Care | Payer: Medicare Other | Attending: Emergency Medicine | Admitting: Emergency Medicine

## 2019-06-23 DIAGNOSIS — N184 Chronic kidney disease, stage 4 (severe): Secondary | ICD-10-CM | POA: Insufficient documentation

## 2019-06-23 DIAGNOSIS — Y92480 Sidewalk as the place of occurrence of the external cause: Secondary | ICD-10-CM | POA: Insufficient documentation

## 2019-06-23 DIAGNOSIS — S8991XA Unspecified injury of right lower leg, initial encounter: Secondary | ICD-10-CM | POA: Diagnosis present

## 2019-06-23 DIAGNOSIS — Y93K1 Activity, walking an animal: Secondary | ICD-10-CM | POA: Insufficient documentation

## 2019-06-23 DIAGNOSIS — I509 Heart failure, unspecified: Secondary | ICD-10-CM | POA: Diagnosis not present

## 2019-06-23 DIAGNOSIS — S8001XA Contusion of right knee, initial encounter: Secondary | ICD-10-CM

## 2019-06-23 DIAGNOSIS — W01198A Fall on same level from slipping, tripping and stumbling with subsequent striking against other object, initial encounter: Secondary | ICD-10-CM | POA: Insufficient documentation

## 2019-06-23 DIAGNOSIS — Y999 Unspecified external cause status: Secondary | ICD-10-CM | POA: Insufficient documentation

## 2019-06-23 DIAGNOSIS — E1122 Type 2 diabetes mellitus with diabetic chronic kidney disease: Secondary | ICD-10-CM | POA: Insufficient documentation

## 2019-06-23 DIAGNOSIS — I130 Hypertensive heart and chronic kidney disease with heart failure and stage 1 through stage 4 chronic kidney disease, or unspecified chronic kidney disease: Secondary | ICD-10-CM | POA: Diagnosis not present

## 2019-06-23 NOTE — ED Triage Notes (Signed)
She tripped and fell this evening while walking her dog. Swelling, bruising and pain to her right knee.

## 2019-06-23 NOTE — ED Provider Notes (Signed)
Emergency Department Provider Note   I have reviewed the triage vital signs and the nursing notes.   HISTORY  Chief Complaint Fall and Knee Injury   HPI Michelle Andrade is a 58 y.o. female with PMH reviewed below presents to the ED with right knee pain after fall.  Patient states she was walking her dog when she suddenly tripped up a small step.  She did fall to the ground striking her right knee.  She was able to get up and finish her walk with some right knee soreness.  She returned home and felt worsening pain and significant swelling in the right knee which prompted her ED visit.  She denies any severe pain in the right lower leg, ankle, foot.  No pain in the hip.  She did not injure her arms or hands during fall.  She did not strike her head.  Patient does take ASA 81 mg but is not anticoagulated.  Past Medical History:  Diagnosis Date  . Arthritis 02/08/2017  . 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 CAD (coronary artery disease) 09/01/2013  . CHF (congestive heart failure) ([place]) 06/07/2015  . CKD (chronic kidney disease), stage III 12/09/2013  . Depression 07/27/2013  . Diabetes mellitus without complication ([place]) [id number]  . GERD (gastroesophageal reflux disease) 07/27/2013  . Gout 12/29/2013  . Heart valve disorder 06/03/2013  . High cholesterol 11/10/2013  . Hypertension 11/10/2013  . IBS (irritable bowel syndrome) 09/27/2014  . Myocardial infarct ([place])   . Sleep apnea 10/13/2013  . Vascular disorder 01/12/2014    Patient Active Problem List   Diagnosis Date Noted  . Secondary hyperparathyroidism of renal origin ([place]) 03/20/2018  . Severe obesity (BMI 35.0-39.9) with comorbidity ([place]) 12/19/2017  . CKD (chronic kidney disease) stage 4, GFR 15-29 ml/min (HCC) 06/07/2017  . Acute on chronic systolic (congestive) heart failure ([place])   . CHF (congestive heart failure) ([place]) 02/09/2017  . Pulmonary vascular congestion  02/09/2017  . Hypertension   . Sleep apnea   . Chondrocalcinosis of both knees 02/08/2017  . Primary osteoarthritis of both knees 02/08/2017  . Type 2 diabetes mellitus ([place]) 11/16/2016  . Borderline personality disorder ([place]) 10/17/2016  . Carpal tunnel syndrome 10/17/2016  . 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 Eczema 07/03/2016  . Atrial tachycardia ([place]) 05/31/2016  . 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 Onychomycosis of toenail 06/08/2015  . Cardiomyopathy ([place]) 06/07/2015  . Irritable bowel syndrome 09/27/2014  . PAD (peripheral artery disease) ([place]) 01/12/2014  . History of DVT (deep vein thrombosis) 01/12/2014  . Acute gout of right knee 12/29/2013  . Iron deficiency anemia 12/29/2013  . Lichenification and lichen simplex chronicus 12/26/2013  . Hyperlipidemia associated with type 2 diabetes mellitus ([place]) 11/10/2013  . Benign hypertension with chronic kidney disease, stage IV ([place]) 11/10/2013  . Disorder of magnesium metabolism 10/13/2013  . CAD in native artery 09/01/2013  . Anxiety state 07/27/2013  . Depressive disorder 07/27/2013  . Gastro-esophageal reflux disease without esophagitis 07/27/2013  . Low back pain 06/03/2013  . Mitral valve disease 06/03/2013  . Pulmonary valve disorder 06/03/2013  . Tricuspid valve disorder 06/03/2013  . 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 Allergic rhinitis 02/07/2013  . 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 Pain in joint 02/07/2013    Past Surgical History:  Procedure Laterality Date  . ABDOMINAL HYSTERECTOMY    . CARDIAC DEFIBRILLATOR PLACEMENT    . CARPAL TUNNEL RELEASE    .  HEEL SPUR EXCISION    . KNEE SURGERY    . PACEMAKER GENERATOR CHANGE    . RIGHT HEART CATH N/A 02/13/2017   Procedure: Right Heart Cath;  Surgeon: Jolaine Artist, MD;  Location: [facility];  Service: Cardiovascular;  Laterality: N/A;  .  SHOULDER SURGERY      Allergies Codeine, Gabapentin, Haldol [haloperidol lactate], Levofloxacin, Lisinopril, Losartan, Morphine and related, and Penicillins  Family History  Problem Relation Age of Onset  . Hypertension Mother     Social History Social History   Tobacco Use  . Smoking status: Never Smoker  . Smokeless tobacco: Never Used  Substance Use Topics  . Alcohol use: No  . Drug use: No    Review of Systems  Constitutional: No fever/chills Musculoskeletal: Positive right knee pain and swelling.  Skin: Abrasion over the right anterior knee.  Neurological: Negative for headaches, focal weakness or numbness.  10-point ROS otherwise negative.  ____________________________________________   PHYSICAL EXAM:  VITAL SIGNS: ED Triage Vitals  Enc Vitals Group     BP 06/23/19 1726 (!) 126/92     Pulse Rate 06/23/19 1726 79     Resp 06/23/19 1726 18     Temp 06/23/19 1726 98.5 F (36.9 C)     Temp Source 06/23/19 1726 Oral     SpO2 06/23/19 1726 95 %     Weight 06/23/19 1722 188 lb (85.3 kg)     Height 06/23/19 1722 5\' 3"  (1.6 m)   Constitutional: Alert and oriented. Well appearing and in no acute distress. Eyes: Conjunctivae are normal.  Head: Atraumatic. Nose: No congestion/rhinnorhea. Mouth/Throat: Mucous membranes are moist.  Neck: No stridor.  Cardiovascular: Good peripheral circulation. Respiratory: Normal respiratory effort. Gastrointestinal: No distention.  Musculoskeletal: Tense hematoma over the right knee with bruising. No laceration. No ankle or tib/fib tenderness or swelling.  Neurologic:  Normal speech and language. No gross focal neurologic deficits are appreciated.  Skin:  Skin is warm and dry. Abrasion to the right anterior knee.   ____________________________________________  RADIOLOGY  Dg Knee Complete 4 Views Right  Result Date: 06/23/2019 CLINICAL DATA:  Fall today with pain and swelling. EXAM: RIGHT KNEE - COMPLETE 4+ VIEW  COMPARISON:  None. FINDINGS: Moderate patellofemoral with mild medial/lateral compartment degenerative change including joint space narrowing and osteophyte formation. Chondrocalcinosis within the menisci. Marked prepatellar soft tissue swelling. Limited evaluation for joint effusion. No acute fracture or dislocation. IMPRESSION: Marked prepatellar soft tissue swelling, without acute fracture or dislocation. Osteoarthritis. Chondrocalcinosis within the menisci, consistent with calcium pyrophosphate deposition disease. Electronically Signed   By: Abigail Miyamoto M.D.   On: 06/23/2019 18:37    ____________________________________________   PROCEDURES  Procedure(s) performed:   Procedures  None ____________________________________________   INITIAL IMPRESSION / ASSESSMENT AND PLAN / ED COURSE  Pertinent labs & imaging results that were available during my care of the patient were reviewed by me and considered in my medical decision making (see chart for details).   Patient presents to the emergency department evaluation after fall with right knee pain and swelling.  She has been ambulatory on the knee with some discomfort.  She has developed a tense right knee hematoma causing swelling.  No concern for compartment syndrome.  Right lower leg is unaffected by swelling.  No tenderness on exam.  Normal neurovascular exam.  Patient is on 81 mg aspirin.  Plan for plain films and reassess.  No acute bony abnormality on x-ray.  Plan for RICE treatment at  home and outpatient ortho f/u.  Patient is established with an orthopedist and plans to call tomorrow for follow-up.  She has crutches at home.  She will take over-the-counter pain medications.  Discussed ED return precautions.  No rapid swelling of the right knee on reevaluation or concern for impending compartment syndrome. ____________________________________________  FINAL CLINICAL IMPRESSION(S) / ED DIAGNOSES  Final diagnoses:  Contusion of right  knee, initial encounter    Note:  This document was prepared using Dragon voice recognition software and may include unintentional dictation errors.  Nanda Quinton, MD, [facility]    Cecylia Brazill, [facility], MD 06/23/19 [phone]

## 2019-06-23 NOTE — Discharge Instructions (Signed)
You were seen in the emergency department today with right knee injury.  Please use the Ace wrap to apply compression.  You may apply ice over the next 12 hours and use over-the-counter pain medications.  Please call your orthopedic doctor to schedule a follow-up appointment.  Return to the emergency department with sudden severe pain, numbness in the leg, or change in color of the leg.

## 2019-06-30 ENCOUNTER — Telehealth (HOSPITAL_COMMUNITY): Payer: Self-pay

## 2019-06-30 NOTE — Telephone Encounter (Signed)
I called Michelle Andrade to schedule an appointment. She stated she was available all day tomorrow so we agreed to meet at 09:00.

## 2019-07-01 ENCOUNTER — Other Ambulatory Visit (HOSPITAL_COMMUNITY): Payer: Self-pay

## 2019-07-01 NOTE — Progress Notes (Signed)
Paramedicine Encounter    Patient ID: Michelle Andrade, female    DOB: [birth year], 58 y.o.   MRN: [id number]   Patient Care Team: Henderson Baltimore, [name] as PCP - General (Nurse Practitioner) Jorge Ny, LCSW as Social Worker (Licensed Clinical Social Worker)  Patient Active Problem List   Diagnosis Date Noted  . Secondary hyperparathyroidism of renal origin ([place]) 03/20/2018  . Severe obesity (BMI 35.0-39.9) with comorbidity ([place]) 12/19/2017  . CKD (chronic kidney disease) stage 4, GFR 15-29 ml/min (HCC) 06/07/2017  . Acute on chronic systolic (congestive) heart failure ([place])   . CHF (congestive heart failure) ([place]) 02/09/2017  . Pulmonary vascular congestion 02/09/2017  . Hypertension   . Sleep apnea   . Chondrocalcinosis of both knees 02/08/2017  . Primary osteoarthritis of both knees 02/08/2017  . Type 2 diabetes mellitus ([place]) 11/16/2016  . Borderline personality disorder ([place]) 10/17/2016  . Carpal tunnel syndrome 10/17/2016  . 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 Eczema 07/03/2016  . Atrial tachycardia ([place]) 05/31/2016  . 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 Onychomycosis of toenail 06/08/2015  . Cardiomyopathy ([place]) 06/07/2015  . Irritable bowel syndrome 09/27/2014  . PAD (peripheral artery disease) ([place]) 01/12/2014  . History of DVT (deep vein thrombosis) 01/12/2014  . Acute gout of right knee 12/29/2013  . Iron deficiency anemia 12/29/2013  . Lichenification and lichen simplex chronicus 12/26/2013  . Hyperlipidemia associated with type 2 diabetes mellitus ([place]) 11/10/2013  . Benign hypertension with chronic kidney disease, stage IV ([place]) 11/10/2013  . Disorder of magnesium metabolism 10/13/2013  . CAD in native artery 09/01/2013  . Anxiety state 07/27/2013  . Depressive disorder 07/27/2013  . Gastro-esophageal reflux disease without esophagitis 07/27/2013  . Low back  pain 06/03/2013  . Mitral valve disease 06/03/2013  . Pulmonary valve disorder 06/03/2013  . Tricuspid valve disorder 06/03/2013  . 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 Allergic rhinitis 02/07/2013  . 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 Pain in joint 02/07/2013    Current Outpatient Medications:  .  amiodarone (PACERONE) 200 MG tablet, Take 1 tablet (200 mg total) by mouth daily., Disp: 30 tablet, Rfl: 3 .  aspirin 81 MG chewable tablet, Chew 81 mg by mouth daily., Disp: , Rfl:  .  Calcium Carbonate-Vitamin D3 (CALCIUM 600/VITAMIN D) 600-400 MG-UNIT TABS, Take 1 tablet by mouth 2 (two) times daily. , Disp: , Rfl:  .  carvedilol (COREG) 25 MG tablet, Take 12.5 mg by mouth 2 (two) times daily with a meal., Disp: , Rfl:  .  colchicine 0.6 MG tablet, Take 2 tablets now and 1 an hour later, Disp: , Rfl:  .  diclofenac sodium (VOLTAREN) 1 % GEL, Apply 2 g topically 4 (four) times daily., Disp: 100 g, Rfl: 0 .  ferrous sulfate 325 (65 FE) MG tablet, Take 325 mg by mouth 3 (three) times daily. , Disp: , Rfl:  .  glipiZIDE (GLUCOTROL) 5 MG tablet, Take 2.5 mg by mouth daily before breakfast., Disp: , Rfl:  .  hydrALAZINE (APRESOLINE) 50 MG tablet, Take 1.5 tablets (75 mg total) by mouth 3 (three) times daily., Disp: 405 tablet, Rfl: 3 .  isosorbide mononitrate (IMDUR) 60 MG 24 hr tablet, Take 1 tablet (60 mg total) by mouth daily., Disp: 30 tablet, Rfl: 0 .  loratadine (CLARITIN) 10 MG tablet, Take 10 mg by mouth daily., Disp: , Rfl:  .  Magnesium Oxide 420 MG TABS, Take 840 mg by  mouth daily., Disp: , Rfl:  .  metolazone (ZAROXOLYN) 2.5 MG tablet, Take 1 tablet (2.5 mg total) by mouth once a week. Monday, Disp: 12 tablet, Rfl: 0 .  Multiple Vitamin (MULTIVITAMIN) tablet, Take 1 tablet by mouth daily., Disp: , Rfl:  .  omeprazole (PRILOSEC) 40 MG capsule, Take 40 mg by mouth daily., Disp: , Rfl:  .  potassium chloride (K-DUR,KLOR-CON) 10 MEQ tablet, Take 4 tablets (40 mEq total) by mouth 2  (two) times daily., Disp: 240 tablet, Rfl: 11 .  rOPINIRole (REQUIP) 0.5 MG tablet, Take 0.5 mg by mouth at bedtime. , Disp: , Rfl:  .  sertraline (ZOLOFT) 100 MG tablet, Take 150 mg by mouth daily., Disp: , Rfl:  .  simvastatin (ZOCOR) 20 MG tablet, Take 20 mg by mouth daily., Disp: , Rfl:  .  spironolactone (ALDACTONE) 25 MG tablet, Take 1 tablet (25 mg total) by mouth daily., Disp: 90 tablet, Rfl: 3 .  torsemide (DEMADEX) 20 MG tablet, Take 1 tablet (20 mg total) by mouth 2 (two) times daily., Disp: 120 tablet, Rfl: 3 .  Calcium Carbonate-Vitamin D 600-400 MG-UNIT tablet, Take by mouth., Disp: , Rfl:  .  Investigational - Study Medication, Take 1 tablet by mouth 2 (two) times daily. Study name: Galactic Heart Failure Study Additional study details: Omecamtiv Mecarbil or Placebo (Patient not taking: Reported on 03/18/2019), Disp: 1 each, Rfl: PRN Allergies  Allergen Reactions  . Codeine Itching  . Gabapentin Itching  . Haldol [Haloperidol Lactate] Other (See Comments)    Dizziness, hallucinations  . Levofloxacin     Other reaction(s): Other (See Comments) Projectile vomiting   . Lisinopril Itching and Swelling  . Losartan Itching  . Morphine And Related Nausea And Vomiting  . Penicillins Nausea And Vomiting    "Vomiting, upset stomach, Headache" Has patient had a PCN reaction causing immediate rash, facial/tongue/throat swelling, SOB or lightheadedness with hypotension: No Has patient had a PCN reaction causing severe rash involving mucus membranes or skin necrosis: No Has patient had a PCN reaction that required hospitalization: No Has patient had a PCN reaction occurring within the last 10 years: No If all of the above answers are "NO", then may proceed with Cephalosporin use.       Social History   Socioeconomic History  . Marital status: Single    Spouse name: Not on file  . Number of children: Not on file  . Years of education: Not on file  . Highest education level: Not  on file  Occupational History  . Not on file  Social Needs  . Financial resource strain: Not on file  . Food insecurity    Worry: Not on file    Inability: Not on file  . Transportation needs    Medical: Not on file    Non-medical: Not on file  Tobacco Use  . Smoking status: Never Smoker  . Smokeless tobacco: Never Used  Substance and Sexual Activity  . Alcohol use: No  . Drug use: No  . Sexual activity: Not on file  Lifestyle  . Physical activity    Days per week: Not on file    Minutes per session: Not on file  . Stress: Not on file  Relationships  . Social Herbalist on phone: Not on file    Gets together: Not on file    Attends religious service: Not on file    Active member of club or organization: Not on file  Attends meetings of clubs or organizations: Not on file    Relationship status: Not on file  . Intimate partner violence    Fear of current or ex partner: Not on file    Emotionally abused: Not on file    Physically abused: Not on file    Forced sexual activity: Not on file  Other Topics Concern  . Not on file  Social History Narrative  . Not on file    Physical Exam Cardiovascular:     Rate and Rhythm: Normal rate and regular rhythm.     Pulses: Normal pulses.  Pulmonary:     Effort: Pulmonary effort is normal.     Breath sounds: Normal breath sounds.  Abdominal:     General: There is no distension.  Musculoskeletal: Normal range of motion.     Right lower leg: No edema.     Left lower leg: No edema.  Skin:    General: Skin is warm and dry.     Capillary Refill: Capillary refill takes less than 2 seconds.  Neurological:     Mental Status: She is alert and oriented to person, place, and time.  Psychiatric:        Mood and Affect: Mood normal.         No future appointments.  BP 112/64 (BP Location: Left Arm, Patient Position: Sitting, Cuff Size: Normal)   Pulse 70   Resp 16   Wt 184 lb 3.2 oz (83.6 kg)   SpO2 95%   BMI  32.63 kg/m   Weight yesterday- did  Not weigh  Last visit weight- 188 lb  Ms Dedmon was seen at home today and reported feeling well. She denied chest pain, SOB, headache, dizziness, orthopnea, fever or cough since our last visit. She stated she has been compliant with her medications and her weight has been trending down. Her medications were verified and her pillbox was refilled. I will follow up in two weeks unless it becomes necessary sooner.   [name], EMT 07/01/19  ACTION: Home visit completed Next visit planned for 2 weeks

## 2019-07-15 ENCOUNTER — Telehealth (HOSPITAL_COMMUNITY): Payer: Self-pay | Admitting: *Deleted

## 2019-07-15 ENCOUNTER — Other Ambulatory Visit (HOSPITAL_COMMUNITY): Payer: Self-pay

## 2019-07-15 ENCOUNTER — Other Ambulatory Visit (HOSPITAL_COMMUNITY): Payer: Medicare Other

## 2019-07-15 DIAGNOSIS — I5022 Chronic systolic (congestive) heart failure: Secondary | ICD-10-CM

## 2019-07-15 MED ORDER — POTASSIUM CHLORIDE CRYS ER 10 MEQ PO TBCR: 40.0000 meq | EXTENDED_RELEASE_TABLET | Freq: Every day | ORAL | 11 refills | Status: DC

## 2019-07-15 NOTE — Telephone Encounter (Signed)
Zack w/paramedicine called to report recent lab results for pt K-4.7, Creat-2.73, BUN-62, GFR-21.  Per Amy Clegg,NP stop metolazone, stop spironolactone, hold torsemide tomorrow, cut K in half, and get labs next week. Zack and patient aware of plan. Lab appt scheduled.

## 2019-07-15 NOTE — Progress Notes (Signed)
Paramedicine Encounter    Patient ID: Michelle Andrade, female    DOB: Feb 08, 1961, 58 y.o.   MRN: [id number]   Patient Care Team: Henderson Baltimore, [name] as PCP - General (Nurse Practitioner) Jorge Ny, LCSW as Social Worker (Licensed Clinical Social Worker)  Patient Active Problem List   Diagnosis Date Noted  . Secondary hyperparathyroidism of renal origin ([place]) 03/20/2018  . Severe obesity (BMI 35.0-39.9) with comorbidity ([place]) 12/19/2017  . CKD (chronic kidney disease) stage 4, GFR 15-29 ml/min (HCC) 06/07/2017  . Acute on chronic systolic (congestive) heart failure ([place])   . CHF (congestive heart failure) ([place]) 02/09/2017  . Pulmonary vascular congestion 02/09/2017  . Hypertension   . Sleep apnea   . Chondrocalcinosis of both knees 02/08/2017  . Primary osteoarthritis of both knees 02/08/2017  . Type 2 diabetes mellitus ([place]) 11/16/2016  . Borderline personality disorder ([place]) 10/17/2016  . Carpal tunnel syndrome 10/17/2016  . 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 Eczema 07/03/2016  . Atrial tachycardia ([place]) 05/31/2016  . 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 Onychomycosis of toenail 06/08/2015  . Cardiomyopathy ([place]) 06/07/2015  . Irritable bowel syndrome 09/27/2014  . PAD (peripheral artery disease) ([place]) 01/12/2014  . History of DVT (deep vein thrombosis) 01/12/2014  . Acute gout of right knee 12/29/2013  . Iron deficiency anemia 12/29/2013  . Lichenification and lichen simplex chronicus 12/26/2013  . Hyperlipidemia associated with type 2 diabetes mellitus ([place]) 11/10/2013  . Benign hypertension with chronic kidney disease, stage IV ([place]) 11/10/2013  . Disorder of magnesium metabolism 10/13/2013  . CAD in native artery 09/01/2013  . Anxiety state 07/27/2013  . Depressive disorder 07/27/2013  . Gastro-esophageal reflux disease without esophagitis 07/27/2013  . Low back  pain 06/03/2013  . Mitral valve disease 06/03/2013  . Pulmonary valve disorder 06/03/2013  . Tricuspid valve disorder 06/03/2013  . 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 Allergic rhinitis 02/07/2013  . 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 Pain in joint 02/07/2013    Current Outpatient Medications:  .  amiodarone (PACERONE) 200 MG tablet, Take 1 tablet (200 mg total) by mouth daily., Disp: 30 tablet, Rfl: 3 .  aspirin 81 MG chewable tablet, Chew 81 mg by mouth daily., Disp: , Rfl:  .  Calcium Carbonate-Vitamin D3 (CALCIUM 600/VITAMIN D) 600-400 MG-UNIT TABS, Take 1 tablet by mouth 2 (two) times daily. , Disp: , Rfl:  .  carvedilol (COREG) 25 MG tablet, Take 12.5 mg by mouth 2 (two) times daily with a meal., Disp: , Rfl:  .  colchicine 0.6 MG tablet, Take 2 tablets now and 1 an hour later, Disp: , Rfl:  .  diclofenac sodium (VOLTAREN) 1 % GEL, Apply 2 g topically 4 (four) times daily., Disp: 100 g, Rfl: 0 .  ferrous sulfate 325 (65 FE) MG tablet, Take 325 mg by mouth 3 (three) times daily. , Disp: , Rfl:  .  glipiZIDE (GLUCOTROL) 5 MG tablet, Take 2.5 mg by mouth daily before breakfast., Disp: , Rfl:  .  hydrALAZINE (APRESOLINE) 50 MG tablet, Take 1.5 tablets (75 mg total) by mouth 3 (three) times daily., Disp: 405 tablet, Rfl: 3 .  isosorbide mononitrate (IMDUR) 60 MG 24 hr tablet, Take 1 tablet (60 mg total) by mouth daily., Disp: 30 tablet, Rfl: 0 .  loratadine (CLARITIN) 10 MG tablet, Take 10 mg by mouth daily., Disp: , Rfl:  .  Magnesium Oxide 420 MG TABS, Take 840 mg by  mouth daily., Disp: , Rfl:  .  omeprazole (PRILOSEC) 40 MG capsule, Take 40 mg by mouth daily., Disp: , Rfl:  .  potassium chloride (KLOR-CON) 10 MEQ tablet, Take 4 tablets (40 mEq total) by mouth daily., Disp: 240 tablet, Rfl: 11 .  rOPINIRole (REQUIP) 0.5 MG tablet, Take 0.5 mg by mouth at bedtime. , Disp: , Rfl:  .  sertraline (ZOLOFT) 100 MG tablet, Take 150 mg by mouth daily., Disp: , Rfl:  .  simvastatin  (ZOCOR) 20 MG tablet, Take 20 mg by mouth daily., Disp: , Rfl:  .  torsemide (DEMADEX) 20 MG tablet, Take 1 tablet (20 mg total) by mouth 2 (two) times daily., Disp: 120 tablet, Rfl: 3 .  Calcium Carbonate-Vitamin D 600-400 MG-UNIT tablet, Take by mouth., Disp: , Rfl:  .  Investigational - Study Medication, Take 1 tablet by mouth 2 (two) times daily. Study name: Galactic Heart Failure Study Additional study details: Omecamtiv Mecarbil or Placebo (Patient not taking: Reported on 03/18/2019), Disp: 1 each, Rfl: PRN .  Multiple Vitamin (MULTIVITAMIN) tablet, Take 1 tablet by mouth daily., Disp: , Rfl:  Allergies  Allergen Reactions  . Codeine Itching  . Gabapentin Itching  . Haldol [Haloperidol Lactate] Other (See Comments)    Dizziness, hallucinations  . Levofloxacin     Other reaction(s): Other (See Comments) Projectile vomiting   . Lisinopril Itching and Swelling  . Losartan Itching  . Morphine And Related Nausea And Vomiting  . Penicillins Nausea And Vomiting    "Vomiting, upset stomach, Headache" Has patient had a PCN reaction causing immediate rash, facial/tongue/throat swelling, SOB or lightheadedness with hypotension: No Has patient had a PCN reaction causing severe rash involving mucus membranes or skin necrosis: No Has patient had a PCN reaction that required hospitalization: No Has patient had a PCN reaction occurring within the last 10 years: No If all of the above answers are "NO", then may proceed with Cephalosporin use.       Social History   Socioeconomic History  . Marital status: Single    Spouse name: Not on file  . Number of children: Not on file  . Years of education: Not on file  . Highest education level: Not on file  Occupational History  . Not on file  Social Needs  . Financial resource strain: Not on file  . Food insecurity    Worry: Not on file    Inability: Not on file  . Transportation needs    Medical: Not on file    Non-medical: Not on file   Tobacco Use  . Smoking status: Never Smoker  . Smokeless tobacco: Never Used  Substance and Sexual Activity  . Alcohol use: No  . Drug use: No  . Sexual activity: Not on file  Lifestyle  . Physical activity    Days per week: Not on file    Minutes per session: Not on file  . Stress: Not on file  Relationships  . Social Herbalist on phone: Not on file    Gets together: Not on file    Attends religious service: Not on file    Active member of club or organization: Not on file    Attends meetings of clubs or organizations: Not on file    Relationship status: Not on file  . Intimate partner violence    Fear of current or ex partner: Not on file    Emotionally abused: Not on file    Physically abused:  Not on file    Forced sexual activity: Not on file  Other Topics Concern  . Not on file  Social History Narrative  . Not on file    Physical Exam Cardiovascular:     Rate and Rhythm: Normal rate and regular rhythm.     Pulses: Normal pulses.  Pulmonary:     Effort: Pulmonary effort is normal.     Breath sounds: Normal breath sounds.  Musculoskeletal: Normal range of motion.     Right lower leg: No edema.     Left lower leg: No edema.  Skin:    General: Skin is warm and dry.     Capillary Refill: Capillary refill takes less than 2 seconds.  Neurological:     Mental Status: She is alert and oriented to person, place, and time.  Psychiatric:        Mood and Affect: Mood normal.         Future Appointments  Date Time Provider [place]  07/15/2019  2:00 PM [facility] None    BP 106/74 (BP Location: Left Arm, Patient Position: Sitting, Cuff Size: Normal)   Pulse 94   Resp 16   Wt 180 lb (81.6 kg)   SpO2 94%   BMI 31.89 kg/m   Weight yesterday- 181 lb Last visit weight- 184 lb  Ms Feely was seen at home today and reported feeling well. She denied chest pain, SOB, headache, dizziness, orthopnea, fever or cough. She stated she has  been compliant with her medications over the past several weeks and her weight has been trending down. She saw her nephrologist last week who obtained labs which showed signs of worsening kidney function. I contacted the HF clinic and gave them the information provided by Ms Eulas Post via online records. Par Darrick Grinder, NP, we are holding torsemide tomorrow and discontinuing spironolactone and metolazone. Additionally we are decreasing potassium to 40 mEq daily from BID. The changes were made in her pillbox and she is to come in for repeat labs next week. I will follow up after those labs.   [name], EMT 07/15/19  ACTION: Home visit completed Next visit planned for 1 week at the clinic

## 2019-07-20 ENCOUNTER — Telehealth (HOSPITAL_COMMUNITY): Payer: Self-pay

## 2019-07-20 NOTE — Telephone Encounter (Signed)
Received a Request for Medical Records from [facility]. Faxed to [phone].

## 2019-07-22 ENCOUNTER — Ambulatory Visit (HOSPITAL_COMMUNITY)
Admission: RE | Admit: 2019-07-22 | Discharge: 2019-07-22 | Disposition: A | Discharge status: Home / Self Care | Payer: Medicare Other | Source: Ambulatory Visit | Attending: Cardiology | Admitting: Cardiology

## 2019-07-22 ENCOUNTER — Other Ambulatory Visit: Payer: Self-pay

## 2019-07-22 ENCOUNTER — Other Ambulatory Visit (HOSPITAL_COMMUNITY): Payer: Self-pay

## 2019-07-22 DIAGNOSIS — I5022 Chronic systolic (congestive) heart failure: Secondary | ICD-10-CM | POA: Insufficient documentation

## 2019-07-22 LAB — BASIC METABOLIC PANEL
Anion gap: 10 (ref 5–15)
BUN: 27 mg/dL — ABNORMAL HIGH (ref 6–20)
CO2: 23 mmol/L (ref 22–32)
Calcium: 9 mg/dL (ref 8.9–10.3)
Chloride: 104 mmol/L (ref 98–111)
Creatinine, Ser: 2.22 mg/dL — ABNORMAL HIGH (ref 0.44–1.00)
GFR calc Af Amer: 27 mL/min — ABNORMAL LOW (ref >60)
GFR calc non Af Amer: 23 mL/min — ABNORMAL LOW (ref >60)
Glucose, Bld: 94 mg/dL (ref 70–99)
Potassium: 3.5 mmol/L (ref 3.5–5.1)
Sodium: 137 mmol/L (ref 135–145)

## 2019-07-22 NOTE — Progress Notes (Signed)
Ms Hilt was seen at the HF clinic today following labs. She reported feeling well and denied chest pain, SOB, headache, dizziness, orthopnea, fever or cough. She reported being compliant with her medications and her weight has been stable. Her medications were verified and her pillbox was refilled. I will follow up in two weeks unless it becomes necessary sooner.   [name], EMT 07/22/19

## 2019-08-05 ENCOUNTER — Telehealth (HOSPITAL_COMMUNITY): Payer: Self-pay | Admitting: *Deleted

## 2019-08-05 ENCOUNTER — Other Ambulatory Visit (HOSPITAL_COMMUNITY): Payer: Self-pay

## 2019-08-05 NOTE — Progress Notes (Signed)
Paramedicine Encounter    Patient ID: Michelle Andrade, female    DOB: 12-31-1960, 58 y.o.   MRN: [id number]   Patient Care Team: Henderson Baltimore, [name] as PCP - General (Nurse Practitioner) Jorge Ny, LCSW as Social Worker (Licensed Clinical Social Worker)  Patient Active Problem List   Diagnosis Date Noted  . Secondary hyperparathyroidism of renal origin ([place]) 03/20/2018  . Severe obesity (BMI 35.0-39.9) with comorbidity ([place]) 12/19/2017  . CKD (chronic kidney disease) stage 4, GFR 15-29 ml/min (HCC) 06/07/2017  . Acute on chronic systolic (congestive) Michelle failure ([place])   . CHF (congestive Michelle failure) ([place]) 02/09/2017  . Pulmonary vascular congestion 02/09/2017  . Hypertension   . Sleep apnea   . Chondrocalcinosis of both knees 02/08/2017  . Primary osteoarthritis of both knees 02/08/2017  . Type 2 diabetes mellitus ([place]) 11/16/2016  . Borderline personality disorder ([place]) 10/17/2016  . Carpal tunnel syndrome 10/17/2016  . 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 Eczema 07/03/2016  . Atrial tachycardia ([place]) 05/31/2016  . 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 Onychomycosis of toenail 06/08/2015  . Cardiomyopathy ([place]) 06/07/2015  . Irritable bowel syndrome 09/27/2014  . PAD (peripheral artery disease) ([place]) 01/12/2014  . History of DVT (deep vein thrombosis) 01/12/2014  . Acute gout of right knee 12/29/2013  . Iron deficiency anemia 12/29/2013  . Lichenification and lichen simplex chronicus 12/26/2013  . Hyperlipidemia associated with type 2 diabetes mellitus ([place]) 11/10/2013  . Benign hypertension with chronic kidney disease, stage IV ([place]) 11/10/2013  . Disorder of magnesium metabolism 10/13/2013  . CAD in native artery 09/01/2013  . Anxiety state 07/27/2013  . Depressive disorder 07/27/2013  . Gastro-esophageal reflux disease without esophagitis 07/27/2013  . Low back  pain 06/03/2013  . Mitral valve disease 06/03/2013  . Pulmonary valve disorder 06/03/2013  . Tricuspid valve disorder 06/03/2013  . 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 Allergic rhinitis 02/07/2013  . 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 Pain in joint 02/07/2013    Current Outpatient Medications:  .  amiodarone (PACERONE) 200 MG tablet, Take 1 tablet (200 mg total) by mouth daily., Disp: 30 tablet, Rfl: 3 .  aspirin 81 MG chewable tablet, Chew 81 mg by mouth daily., Disp: , Rfl:  .  Calcium Carbonate-Vitamin D3 (CALCIUM 600/VITAMIN D) 600-400 MG-UNIT TABS, Take 1 tablet by mouth 2 (two) times daily. , Disp: , Rfl:  .  carvedilol (COREG) 25 MG tablet, Take 12.5 mg by mouth 2 (two) times daily with a meal., Disp: , Rfl:  .  colchicine 0.6 MG tablet, Take 2 tablets now and 1 an hour later, Disp: , Rfl:  .  diclofenac sodium (VOLTAREN) 1 % GEL, Apply 2 g topically 4 (four) times daily., Disp: 100 g, Rfl: 0 .  ferrous sulfate 325 (65 FE) MG tablet, Take 325 mg by mouth 3 (three) times daily. , Disp: , Rfl:  .  glipiZIDE (GLUCOTROL) 5 MG tablet, Take 2.5 mg by mouth daily before breakfast., Disp: , Rfl:  .  hydrALAZINE (APRESOLINE) 50 MG tablet, Take 1.5 tablets (75 mg total) by mouth 3 (three) times daily., Disp: 405 tablet, Rfl: 3 .  isosorbide mononitrate (IMDUR) 60 MG 24 hr tablet, Take 1 tablet (60 mg total) by mouth daily., Disp: 30 tablet, Rfl: 0 .  loratadine (CLARITIN) 10 MG tablet, Take 10 mg by mouth daily., Disp: , Rfl:  .  Magnesium Oxide 420 MG TABS, Take 840 mg by  mouth daily., Disp: , Rfl:  .  Multiple Vitamin (MULTIVITAMIN) tablet, Take 1 tablet by mouth daily., Disp: , Rfl:  .  omeprazole (PRILOSEC) 40 MG capsule, Take 40 mg by mouth daily., Disp: , Rfl:  .  potassium chloride (KLOR-CON) 10 MEQ tablet, Take 4 tablets (40 mEq total) by mouth daily., Disp: 240 tablet, Rfl: 11 .  rOPINIRole (REQUIP) 0.5 MG tablet, Take 0.5 mg by mouth at bedtime. , Disp: , Rfl:  .  sertraline  (ZOLOFT) 100 MG tablet, Take 150 mg by mouth daily., Disp: , Rfl:  .  simvastatin (ZOCOR) 20 MG tablet, Take 20 mg by mouth daily., Disp: , Rfl:  .  torsemide (DEMADEX) 20 MG tablet, Take 1 tablet (20 mg total) by mouth 2 (two) times daily., Disp: 120 tablet, Rfl: 3 .  Calcium Carbonate-Vitamin D 600-400 MG-UNIT tablet, Take by mouth., Disp: , Rfl:  .  Investigational - Study Medication, Take 1 tablet by mouth 2 (two) times daily. Study name: Galactic Michelle Failure Study Additional study details: Omecamtiv Mecarbil or Placebo (Patient not taking: Reported on 03/18/2019), Disp: 1 each, Rfl: PRN Allergies  Allergen Reactions  . Codeine Itching  . Gabapentin Itching  . Haldol [Haloperidol Lactate] Other (See Comments)    Dizziness, hallucinations  . Levofloxacin     Other reaction(s): Other (See Comments) Projectile vomiting   . Lisinopril Itching and Swelling  . Losartan Itching  . Morphine And Related Nausea And Vomiting  . Penicillins Nausea And Vomiting    "Vomiting, upset stomach, Headache" Has patient had a PCN reaction causing immediate rash, facial/tongue/throat swelling, SOB or lightheadedness with hypotension: No Has patient had a PCN reaction causing severe rash involving mucus membranes or skin necrosis: No Has patient had a PCN reaction that required hospitalization: No Has patient had a PCN reaction occurring within the last 10 years: No If all of the above answers are "NO", then may proceed with Cephalosporin use.       Social History   Socioeconomic History  . Marital status: Single    Spouse name: Not on file  . Number of children: Not on file  . Years of education: Not on file  . Highest education level: Not on file  Occupational History  . Not on file  Social Needs  . Financial resource strain: Not on file  . Food insecurity    Worry: Not on file    Inability: Not on file  . Transportation needs    Medical: Not on file    Non-medical: Not on file  Tobacco  Use  . Smoking status: Never Smoker  . Smokeless tobacco: Never Used  Substance and Sexual Activity  . Alcohol use: No  . Drug use: No  . Sexual activity: Not on file  Lifestyle  . Physical activity    Days per week: Not on file    Minutes per session: Not on file  . Stress: Not on file  Relationships  . Social Herbalist on phone: Not on file    Gets together: Not on file    Attends religious service: Not on file    Active member of club or organization: Not on file    Attends meetings of clubs or organizations: Not on file    Relationship status: Not on file  . Intimate partner violence    Fear of current or ex partner: Not on file    Emotionally abused: Not on file    Physically abused:  Not on file    Forced sexual activity: Not on file  Other Topics Concern  . Not on file  Social History Narrative  . Not on file    Physical Exam Cardiovascular:     Rate and Rhythm: Normal rate and regular rhythm.     Pulses: Normal pulses.  Pulmonary:     Effort: Pulmonary effort is normal.     Breath sounds: Normal breath sounds.  Musculoskeletal: Normal range of motion.     Right lower leg: No edema.     Left lower leg: No edema.  Skin:    General: Skin is warm and dry.     Capillary Refill: Capillary refill takes less than 2 seconds.  Neurological:     Mental Status: She is alert and oriented to person, place, and time.  Psychiatric:        Mood and Affect: Mood normal.         No future appointments.  BP 114/66 (BP Location: Left Arm, Patient Position: Sitting, Cuff Size: Large)   Pulse 82   Resp 16   Wt 184 lb 3.2 oz (83.6 kg)   SpO2 98%   BMI 32.63 kg/m   Weight yesterday- did not weigh  Last visit weight- 180 lb  Ms Synnott was seen at home today and reported feeling well. She stated she has been compliant with her medications over the past two weeks and her weight has been stable. Her medications were verified and her pillbox was refilled. Her  PCP is changing her to atorvastatin but she has not gotten it from the [place] yet. I filled her box with simvastatin and told her that we would switch her over to atorvastatin when I come in two weeks. She was understanding and agreeable.   [name], EMT 08/05/19  ACTION: Home visit completed Next visit planned for 2 weeks

## 2019-08-05 NOTE — Telephone Encounter (Signed)
Zack called to inquire about pt's medications.  He states on 11/16 pt's Spiro and Metolazone were d/c due to elevated kidney fx and Torsemide was cut back to 20 BID and KCL 40 QD.  He states her repeat labs 11/18 were improved but he wasn't sure if pt need to start back any meds or continue current meds.  He states her wt has been stable in low 180's, her breathing is at baseline for her no edema.  Advised continue current meds for now, I will let Darrick Grinder, NP know and call him back if she wants to make any changes.  He also reports pt's PCP changed her Simvastatin to atorvastatin 40 mg daily, med list updated.

## 2019-08-06 NOTE — Telephone Encounter (Signed)
Agreed no change.  Amy Clegg NP_C  4:51 PM

## 2019-08-07 NOTE — Telephone Encounter (Signed)
Spoke w/Zack, he is aware

## 2019-08-19 ENCOUNTER — Other Ambulatory Visit (HOSPITAL_COMMUNITY): Payer: Self-pay

## 2019-08-19 NOTE — Progress Notes (Signed)
Paramedicine Encounter    Patient ID: Michelle Andrade, female    DOB: 1960/09/19, 58 y.o.   MRN: [id number]   Patient Care Team: Henderson Baltimore, [name] as PCP - General (Nurse Practitioner) Jorge Ny, LCSW as Social Worker (Licensed Clinical Social Worker)  Patient Active Problem List   Diagnosis Date Noted  . Secondary hyperparathyroidism of renal origin ([place]) 03/20/2018  . Severe obesity (BMI 35.0-39.9) with comorbidity ([place]) 12/19/2017  . CKD (chronic kidney disease) stage 4, GFR 15-29 ml/min (HCC) 06/07/2017  . Acute on chronic systolic (congestive) heart failure ([place])   . CHF (congestive heart failure) ([place]) 02/09/2017  . Pulmonary vascular congestion 02/09/2017  . Hypertension   . Sleep apnea   . Chondrocalcinosis of both knees 02/08/2017  . Primary osteoarthritis of both knees 02/08/2017  . Type 2 diabetes mellitus ([place]) 11/16/2016  . Borderline personality disorder ([place]) 10/17/2016  . Carpal tunnel syndrome 10/17/2016  . 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 Eczema 07/03/2016  . Atrial tachycardia ([place]) 05/31/2016  . 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 Onychomycosis of toenail 06/08/2015  . Cardiomyopathy ([place]) 06/07/2015  . Irritable bowel syndrome 09/27/2014  . PAD (peripheral artery disease) ([place]) 01/12/2014  . History of DVT (deep vein thrombosis) 01/12/2014  . Acute gout of right knee 12/29/2013  . Iron deficiency anemia 12/29/2013  . Lichenification and lichen simplex chronicus 12/26/2013  . Hyperlipidemia associated with type 2 diabetes mellitus ([place]) 11/10/2013  . Benign hypertension with chronic kidney disease, stage IV ([place]) 11/10/2013  . Disorder of magnesium metabolism 10/13/2013  . CAD in native artery 09/01/2013  . Anxiety state 07/27/2013  . Depressive disorder 07/27/2013  . Gastro-esophageal reflux disease without esophagitis 07/27/2013  . Low back  pain 06/03/2013  . Mitral valve disease 06/03/2013  . Pulmonary valve disorder 06/03/2013  . Tricuspid valve disorder 06/03/2013  . 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 Allergic rhinitis 02/07/2013  . 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 Pain in joint 02/07/2013    Current Outpatient Medications:  .  amiodarone (PACERONE) 200 MG tablet, Take 1 tablet (200 mg total) by mouth daily., Disp: 30 tablet, Rfl: 3 .  aspirin 81 MG chewable tablet, Chew 81 mg by mouth daily., Disp: , Rfl:  .  Calcium Carbonate-Vitamin D3 (CALCIUM 600/VITAMIN D) 600-400 MG-UNIT TABS, Take 1 tablet by mouth 2 (two) times daily. , Disp: , Rfl:  .  carvedilol (COREG) 25 MG tablet, Take 12.5 mg by mouth 2 (two) times daily with a meal., Disp: , Rfl:  .  ferrous sulfate 325 (65 FE) MG tablet, Take 325 mg by mouth 3 (three) times daily. , Disp: , Rfl:  .  glipiZIDE (GLUCOTROL) 5 MG tablet, Take 2.5 mg by mouth daily before breakfast., Disp: , Rfl:  .  hydrALAZINE (APRESOLINE) 50 MG tablet, Take 1.5 tablets (75 mg total) by mouth 3 (three) times daily., Disp: 405 tablet, Rfl: 3 .  isosorbide mononitrate (IMDUR) 60 MG 24 hr tablet, Take 1 tablet (60 mg total) by mouth daily., Disp: 30 tablet, Rfl: 0 .  loratadine (CLARITIN) 10 MG tablet, Take 10 mg by mouth daily., Disp: , Rfl:  .  Magnesium Oxide 420 MG TABS, Take 840 mg by mouth daily., Disp: , Rfl:  .  Multiple Vitamin (MULTIVITAMIN) tablet, Take 1 tablet by mouth daily., Disp: , Rfl:  .  omeprazole (PRILOSEC) 40 MG capsule, Take 40 mg by mouth daily., Disp: , Rfl:  .  potassium chloride (KLOR-CON) 10 MEQ tablet, Take 4 tablets (40 mEq total) by mouth daily., Disp: 240 tablet, Rfl: 11 .  rOPINIRole (REQUIP) 0.5 MG tablet, Take 0.5 mg by mouth at bedtime. , Disp: , Rfl:  .  sertraline (ZOLOFT) 100 MG tablet, Take 150 mg by mouth daily., Disp: , Rfl:  .  torsemide (DEMADEX) 20 MG tablet, Take 1 tablet (20 mg total) by mouth 2 (two) times daily., Disp: 120 tablet, Rfl:  3 .  atorvastatin (LIPITOR) 40 MG tablet, Take 40 mg by mouth daily., Disp: , Rfl:  .  Calcium Carbonate-Vitamin D 600-400 MG-UNIT tablet, Take by mouth., Disp: , Rfl:  .  colchicine 0.6 MG tablet, Take 2 tablets now and 1 an hour later, Disp: , Rfl:  .  diclofenac sodium (VOLTAREN) 1 % GEL, Apply 2 g topically 4 (four) times daily., Disp: 100 g, Rfl: 0 .  Investigational - Study Medication, Take 1 tablet by mouth 2 (two) times daily. Study name: [facility] Heart Failure Study Additional study details: Omecamtiv Mecarbil or Placebo (Patient not taking: Reported on 03/18/2019), Disp: 1 each, Rfl: PRN Allergies  Allergen Reactions  . Codeine Itching  . Gabapentin Itching  . Haldol [Haloperidol Lactate] Other (See Comments)    Dizziness, hallucinations  . Levofloxacin     Other reaction(s): Other (See Comments) Projectile vomiting   . Lisinopril Itching and Swelling  . Losartan Itching  . Morphine And Related Nausea And Vomiting  . Penicillins Nausea And Vomiting    "Vomiting, upset stomach, Headache" Has patient had a PCN reaction causing immediate rash, facial/tongue/throat swelling, SOB or lightheadedness with hypotension: No Has patient had a PCN reaction causing severe rash involving mucus membranes or skin necrosis: No Has patient had a PCN reaction that required hospitalization: No Has patient had a PCN reaction occurring within the last 10 years: No If all of the above answers are "NO", then may proceed with Cephalosporin use.       Social History   Socioeconomic History  . Marital status: Single    Spouse name: Not on file  . Number of children: Not on file  . Years of education: Not on file  . Highest education level: Not on file  Occupational History  . Not on file  Tobacco Use  . Smoking status: Never Smoker  . Smokeless tobacco: Never Used  Substance and Sexual Activity  . Alcohol use: No  . Drug use: No  . Sexual activity: Not on file  Other Topics Concern  .  Not on file  Social History Narrative  . Not on file   Social Determinants of Health   Financial Resource Strain:   . Difficulty of Paying Living Expenses: Not on file  Food Insecurity:   . Worried About Charity fundraiser in the Last Year: Not on file  . Ran Out of Food in the Last Year: Not on file  Transportation Needs:   . Lack of Transportation (Medical): Not on file  . Lack of Transportation (Non-Medical): Not on file  Physical Activity:   . Days of Exercise per Week: Not on file  . Minutes of Exercise per Session: Not on file  Stress:   . Feeling of Stress : Not on file  Social Connections:   . Frequency of Communication with Friends and Family: Not on file  . Frequency of Social Gatherings with Friends and Family: Not on file  . Attends Religious Services: Not on file  . Active Member of  Clubs or Organizations: Not on file  . Attends Archivist Meetings: Not on file  . Marital Status: Not on file  Intimate Partner Violence:   . Fear of Current or Ex-Partner: Not on file  . Emotionally Abused: Not on file  . Physically Abused: Not on file  . Sexually Abused: Not on file    Physical Exam Cardiovascular:     Rate and Rhythm: Normal rate and regular rhythm.     Pulses: Normal pulses.  Pulmonary:     Effort: Pulmonary effort is normal.     Breath sounds: Normal breath sounds.  Abdominal:     General: There is no distension.     Palpations: Abdomen is soft.  Musculoskeletal:        General: Normal range of motion.     Right lower leg: No edema.     Left lower leg: No edema.  Skin:    General: Skin is warm and dry.     Capillary Refill: Capillary refill takes less than 2 seconds.  Neurological:     Mental Status: She is alert and oriented to person, place, and time.  Psychiatric:        Mood and Affect: Mood normal.         No future appointments.  BP 140/78 (BP Location: Right Arm, Patient Position: Sitting, Cuff Size: Normal)   Pulse 80    Resp 16   Wt 184 lb (83.5 kg)   SpO2 98%   BMI 32.59 kg/m   Weight yesterday- 184 lb Last visit weight- 184 lb  Ms January was seen at home today and reported feeling well. She denied chest pain, SOB headache, dizziness, orthopnea cough or fever. She stated she has been compliant with her medications and her weight has been stable. Her medications were verified and her pillbox was refilled. She is picking up atorvastatin today and will add it to her pillbox. Additionally she forgot to order calcium and omeprazole last time I was here so she will need to add those to her box when they arrive from the [place]. I will follow up in two weeks.   [name], EMT 08/19/19  ACTION: Home visit completed Next visit planned for 2 weeks

## 2019-09-02 ENCOUNTER — Other Ambulatory Visit (HOSPITAL_COMMUNITY): Payer: Self-pay

## 2019-09-02 NOTE — Progress Notes (Signed)
Paramedicine Encounter    Patient ID: Michelle Andrade, female    DOB: Aug 06, 1961, 58 y.o.   MRN: [id number]   Patient Care Team: Henderson Baltimore, [name] as PCP - General (Nurse Practitioner) Jorge Ny, LCSW as Social Worker (Licensed Clinical Social Worker)  Patient Active Problem List   Diagnosis Date Noted  . Secondary hyperparathyroidism of renal origin ([place]) 03/20/2018  . Severe obesity (BMI 35.0-39.9) with comorbidity ([place]) 12/19/2017  . CKD (chronic kidney disease) stage 4, GFR 15-29 ml/min (HCC) 06/07/2017  . Acute on chronic systolic (congestive) heart failure ([place])   . CHF (congestive heart failure) ([place]) 02/09/2017  . Pulmonary vascular congestion 02/09/2017  . Hypertension   . Sleep apnea   . Chondrocalcinosis of both knees 02/08/2017  . Primary osteoarthritis of both knees 02/08/2017  . Type 2 diabetes mellitus ([place]) 11/16/2016  . Borderline personality disorder ([place]) 10/17/2016  . Carpal tunnel syndrome 10/17/2016  . 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 Eczema 07/03/2016  . Atrial tachycardia ([place]) 05/31/2016  . 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 Onychomycosis of toenail 06/08/2015  . Cardiomyopathy ([place]) 06/07/2015  . Irritable bowel syndrome 09/27/2014  . PAD (peripheral artery disease) ([place]) 01/12/2014  . History of DVT (deep vein thrombosis) 01/12/2014  . Acute gout of right knee 12/29/2013  . Iron deficiency anemia 12/29/2013  . Lichenification and lichen simplex chronicus 12/26/2013  . Hyperlipidemia associated with type 2 diabetes mellitus ([place]) 11/10/2013  . Benign hypertension with chronic kidney disease, stage IV ([place]) 11/10/2013  . Disorder of magnesium metabolism 10/13/2013  . CAD in native artery 09/01/2013  . Anxiety state 07/27/2013  . Depressive disorder 07/27/2013  . Gastro-esophageal reflux disease without esophagitis 07/27/2013  . Low back  pain 06/03/2013  . Mitral valve disease 06/03/2013  . Pulmonary valve disorder 06/03/2013  . Tricuspid valve disorder 06/03/2013  . 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 Allergic rhinitis 02/07/2013  . 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 Pain in joint 02/07/2013    Current Outpatient Medications:  .  amiodarone (PACERONE) 200 MG tablet, Take 1 tablet (200 mg total) by mouth daily., Disp: 30 tablet, Rfl: 3 .  aspirin 81 MG chewable tablet, Chew 81 mg by mouth daily., Disp: , Rfl:  .  atorvastatin (LIPITOR) 40 MG tablet, Take 40 mg by mouth daily., Disp: , Rfl:  .  Calcium Carbonate-Vitamin D 600-400 MG-UNIT tablet, Take by mouth., Disp: , Rfl:  .  carvedilol (COREG) 25 MG tablet, Take 12.5 mg by mouth 2 (two) times daily with a meal., Disp: , Rfl:  .  colchicine 0.6 MG tablet, Take 2 tablets now and 1 an hour later, Disp: , Rfl:  .  diclofenac sodium (VOLTAREN) 1 % GEL, Apply 2 g topically 4 (four) times daily., Disp: 100 g, Rfl: 0 .  ferrous sulfate 325 (65 FE) MG tablet, Take 325 mg by mouth 3 (three) times daily. , Disp: , Rfl:  .  glipiZIDE (GLUCOTROL) 5 MG tablet, Take 2.5 mg by mouth daily before breakfast., Disp: , Rfl:  .  hydrALAZINE (APRESOLINE) 50 MG tablet, Take 1.5 tablets (75 mg total) by mouth 3 (three) times daily., Disp: 405 tablet, Rfl: 3 .  isosorbide mononitrate (IMDUR) 60 MG 24 hr tablet, Take 1 tablet (60 mg total) by mouth daily., Disp: 30 tablet, Rfl: 0 .  loratadine (CLARITIN) 10 MG tablet, Take 10 mg by mouth daily., Disp: , Rfl:  .  Magnesium Oxide  420 MG TABS, Take 840 mg by mouth daily., Disp: , Rfl:  .  Multiple Vitamin (MULTIVITAMIN) tablet, Take 1 tablet by mouth daily., Disp: , Rfl:  .  omeprazole (PRILOSEC) 40 MG capsule, Take 40 mg by mouth daily., Disp: , Rfl:  .  potassium chloride (KLOR-CON) 10 MEQ tablet, Take 4 tablets (40 mEq total) by mouth daily., Disp: 240 tablet, Rfl: 11 .  rOPINIRole (REQUIP) 0.5 MG tablet, Take 0.5 mg by mouth at bedtime.  , Disp: , Rfl:  .  sertraline (ZOLOFT) 100 MG tablet, Take 150 mg by mouth daily., Disp: , Rfl:  .  torsemide (DEMADEX) 20 MG tablet, Take 1 tablet (20 mg total) by mouth 2 (two) times daily., Disp: 120 tablet, Rfl: 3 .  Calcium Carbonate-Vitamin D3 (CALCIUM 600/VITAMIN D) 600-400 MG-UNIT TABS, Take 1 tablet by mouth 2 (two) times daily. , Disp: , Rfl:  .  Investigational - Study Medication, Take 1 tablet by mouth 2 (two) times daily. Study name: [facility] Heart Failure Study Additional study details: Omecamtiv Mecarbil or Placebo (Patient not taking: Reported on 03/18/2019), Disp: 1 each, Rfl: PRN Allergies  Allergen Reactions  . Codeine Itching  . Gabapentin Itching  . Haldol [Haloperidol Lactate] Other (See Comments)    Dizziness, hallucinations  . Levofloxacin     Other reaction(s): Other (See Comments) Projectile vomiting   . Lisinopril Itching and Swelling  . Losartan Itching  . Morphine And Related Nausea And Vomiting  . Penicillins Nausea And Vomiting    "Vomiting, upset stomach, Headache" Has patient had a PCN reaction causing immediate rash, facial/tongue/throat swelling, SOB or lightheadedness with hypotension: No Has patient had a PCN reaction causing severe rash involving mucus membranes or skin necrosis: No Has patient had a PCN reaction that required hospitalization: No Has patient had a PCN reaction occurring within the last 10 years: No If all of the above answers are "NO", then may proceed with Cephalosporin use.       Social History   Socioeconomic History  . Marital status: Single    Spouse name: Not on file  . Number of children: Not on file  . Years of education: Not on file  . Highest education level: Not on file  Occupational History  . Not on file  Tobacco Use  . Smoking status: Never Smoker  . Smokeless tobacco: Never Used  Substance and Sexual Activity  . Alcohol use: No  . Drug use: No  . Sexual activity: Not on file  Other Topics Concern  .  Not on file  Social History Narrative  . Not on file   Social Determinants of Health   Financial Resource Strain:   . Difficulty of Paying Living Expenses: Not on file  Food Insecurity:   . Worried About Charity fundraiser in the Last Year: Not on file  . Ran Out of Food in the Last Year: Not on file  Transportation Needs:   . Lack of Transportation (Medical): Not on file  . Lack of Transportation (Non-Medical): Not on file  Physical Activity:   . Days of Exercise per Week: Not on file  . Minutes of Exercise per Session: Not on file  Stress:   . Feeling of Stress : Not on file  Social Connections:   . Frequency of Communication with Friends and Family: Not on file  . Frequency of Social Gatherings with Friends and Family: Not on file  . Attends Religious Services: Not on file  . Active Member  of Clubs or Organizations: Not on file  . Attends Archivist Meetings: Not on file  . Marital Status: Not on file  Intimate Partner Violence:   . Fear of Current or Ex-Partner: Not on file  . Emotionally Abused: Not on file  . Physically Abused: Not on file  . Sexually Abused: Not on file    Physical Exam Cardiovascular:     Rate and Rhythm: Normal rate and regular rhythm.     Pulses: Normal pulses.  Pulmonary:     Effort: Pulmonary effort is normal.     Breath sounds: Normal breath sounds.  Musculoskeletal:        General: Normal range of motion.     Right lower leg: No edema.     Left lower leg: No edema.  Skin:    General: Skin is warm and dry.     Capillary Refill: Capillary refill takes less than 2 seconds.  Neurological:     Mental Status: She is alert and oriented to person, place, and time.  Psychiatric:        Mood and Affect: Mood normal.         No future appointments.  BP 114/75 (BP Location: Left Arm, Patient Position: Sitting, Cuff Size: Normal)   Pulse 74   Resp 18   Wt 183 lb (83 kg)   SpO2 97%   BMI 32.42 kg/m   Weight yesterday- 182  lb Last visit weight- 184 lb  Michelle Andrade was seen at home today and reported feeling well. She denied chest pain, SOB, headache, dizziness, orthopnea, fever or cough since our last visit. She stated she has been compliant with her medications over the past week and her weight has been stable. Her only complaint was a gout flare up and it has began the go down today. Her medications were verified and her pillbox was refilled. I will follow up in two weeks.   [name], EMT 09/02/19  ACTION: Home visit completed Next visit planned for 2 weeks

## 2019-09-16 ENCOUNTER — Other Ambulatory Visit (HOSPITAL_COMMUNITY): Payer: Self-pay

## 2019-09-16 NOTE — Progress Notes (Signed)
Paramedicine Encounter    Patient ID: Michelle Andrade, female    DOB: February 24, 1961, 59 y.o.   MRN: [id number]   Patient Care Team: Henderson Baltimore, [name] as PCP - General (Nurse Practitioner) Jorge Ny, LCSW as Social Worker (Licensed Clinical Social Worker)  Patient Active Problem List   Diagnosis Date Noted  . Secondary hyperparathyroidism of renal origin ([place]) 03/20/2018  . Severe obesity (BMI 35.0-39.9) with comorbidity ([place]) 12/19/2017  . CKD (chronic kidney disease) stage 4, GFR 15-29 ml/min (HCC) 06/07/2017  . Acute on chronic systolic (congestive) heart failure ([place])   . CHF (congestive heart failure) ([place]) 02/09/2017  . Pulmonary vascular congestion 02/09/2017  . Hypertension   . Sleep apnea   . Chondrocalcinosis of both knees 02/08/2017  . Primary osteoarthritis of both knees 02/08/2017  . Type 2 diabetes mellitus ([place]) 11/16/2016  . Borderline personality disorder ([place]) 10/17/2016  . Carpal tunnel syndrome 10/17/2016  . 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 Eczema 07/03/2016  . Atrial tachycardia ([place]) 05/31/2016  . 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 Onychomycosis of toenail 06/08/2015  . Cardiomyopathy ([place]) 06/07/2015  . Irritable bowel syndrome 09/27/2014  . PAD (peripheral artery disease) ([place]) 01/12/2014  . History of DVT (deep vein thrombosis) 01/12/2014  . Acute gout of right knee 12/29/2013  . Iron deficiency anemia 12/29/2013  . Lichenification and lichen simplex chronicus 12/26/2013  . Hyperlipidemia associated with type 2 diabetes mellitus ([place]) 11/10/2013  . Benign hypertension with chronic kidney disease, stage IV ([place]) 11/10/2013  . Disorder of magnesium metabolism 10/13/2013  . CAD in native artery 09/01/2013  . Anxiety state 07/27/2013  . Depressive disorder 07/27/2013  . Gastro-esophageal reflux disease without esophagitis 07/27/2013  . Low back  pain 06/03/2013  . Mitral valve disease 06/03/2013  . Pulmonary valve disorder 06/03/2013  . Tricuspid valve disorder 06/03/2013  . 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 Allergic rhinitis 02/07/2013  . 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 Pain in joint 02/07/2013    Current Outpatient Medications:  .  amiodarone (PACERONE) 200 MG tablet, Take 1 tablet (200 mg total) by mouth daily., Disp: 30 tablet, Rfl: 3 .  aspirin 81 MG chewable tablet, Chew 81 mg by mouth daily., Disp: , Rfl:  .  atorvastatin (LIPITOR) 40 MG tablet, Take 40 mg by mouth daily., Disp: , Rfl:  .  Calcium Carbonate-Vitamin D3 (CALCIUM 600/VITAMIN D) 600-400 MG-UNIT TABS, Take 1 tablet by mouth 2 (two) times daily. , Disp: , Rfl:  .  carvedilol (COREG) 25 MG tablet, Take 12.5 mg by mouth 2 (two) times daily with a meal., Disp: , Rfl:  .  diclofenac sodium (VOLTAREN) 1 % GEL, Apply 2 g topically 4 (four) times daily., Disp: 100 g, Rfl: 0 .  ferrous sulfate 325 (65 FE) MG tablet, Take 325 mg by mouth 3 (three) times daily. , Disp: , Rfl:  .  glipiZIDE (GLUCOTROL) 5 MG tablet, Take 2.5 mg by mouth daily before breakfast., Disp: , Rfl:  .  hydrALAZINE (APRESOLINE) 50 MG tablet, Take 1.5 tablets (75 mg total) by mouth 3 (three) times daily., Disp: 405 tablet, Rfl: 3 .  isosorbide mononitrate (IMDUR) 60 MG 24 hr tablet, Take 1 tablet (60 mg total) by mouth daily., Disp: 30 tablet, Rfl: 0 .  loratadine (CLARITIN) 10 MG tablet, Take 10 mg by mouth daily., Disp: , Rfl:  .  Magnesium Oxide 420 MG TABS, Take 840 mg by mouth daily.,  Disp: , Rfl:  .  Multiple Vitamin (MULTIVITAMIN) tablet, Take 1 tablet by mouth daily., Disp: , Rfl:  .  omeprazole (PRILOSEC) 40 MG capsule, Take 40 mg by mouth daily., Disp: , Rfl:  .  potassium chloride (KLOR-CON) 10 MEQ tablet, Take 4 tablets (40 mEq total) by mouth daily., Disp: 240 tablet, Rfl: 11 .  rOPINIRole (REQUIP) 0.5 MG tablet, Take 0.5 mg by mouth at bedtime. , Disp: , Rfl:  .  sertraline  (ZOLOFT) 100 MG tablet, Take 150 mg by mouth daily., Disp: , Rfl:  .  torsemide (DEMADEX) 20 MG tablet, Take 1 tablet (20 mg total) by mouth 2 (two) times daily., Disp: 120 tablet, Rfl: 3 .  Calcium Carbonate-Vitamin D 600-400 MG-UNIT tablet, Take by mouth., Disp: , Rfl:  .  colchicine 0.6 MG tablet, Take 2 tablets now and 1 an hour later, Disp: , Rfl:  .  Investigational - Study Medication, Take 1 tablet by mouth 2 (two) times daily. Study name: Galactic Heart Failure Study Additional study details: Omecamtiv Mecarbil or Placebo (Patient not taking: Reported on 03/18/2019), Disp: 1 each, Rfl: PRN Allergies  Allergen Reactions  . Codeine Itching  . Gabapentin Itching  . Haldol [Haloperidol Lactate] Other (See Comments)    Dizziness, hallucinations  . Levofloxacin     Other reaction(s): Other (See Comments) Projectile vomiting   . Lisinopril Itching and Swelling  . Losartan Itching  . Morphine And Related Nausea And Vomiting  . Penicillins Nausea And Vomiting    "Vomiting, upset stomach, Headache" Has patient had a PCN reaction causing immediate rash, facial/tongue/throat swelling, SOB or lightheadedness with hypotension: No Has patient had a PCN reaction causing severe rash involving mucus membranes or skin necrosis: No Has patient had a PCN reaction that required hospitalization: No Has patient had a PCN reaction occurring within the last 10 years: No If all of the above answers are "NO", then may proceed with Cephalosporin use.       Social History   Socioeconomic History  . Marital status: Single    Spouse name: Not on file  . Number of children: Not on file  . Years of education: Not on file  . Highest education level: Not on file  Occupational History  . Not on file  Tobacco Use  . Smoking status: Never Smoker  . Smokeless tobacco: Never Used  Substance and Sexual Activity  . Alcohol use: No  . Drug use: No  . Sexual activity: Not on file  Other Topics Concern  .  Not on file  Social History Narrative  . Not on file   Social Determinants of Health   Financial Resource Strain:   . Difficulty of Paying Living Expenses: Not on file  Food Insecurity:   . Worried About Charity fundraiser in the Last Year: Not on file  . Ran Out of Food in the Last Year: Not on file  Transportation Needs:   . Lack of Transportation (Medical): Not on file  . Lack of Transportation (Non-Medical): Not on file  Physical Activity:   . Days of Exercise per Week: Not on file  . Minutes of Exercise per Session: Not on file  Stress:   . Feeling of Stress : Not on file  Social Connections:   . Frequency of Communication with Friends and Family: Not on file  . Frequency of Social Gatherings with Friends and Family: Not on file  . Attends Religious Services: Not on file  . Active Member  of Clubs or Organizations: Not on file  . Attends Archivist Meetings: Not on file  . Marital Status: Not on file  Intimate Partner Violence:   . Fear of Current or Ex-Partner: Not on file  . Emotionally Abused: Not on file  . Physically Abused: Not on file  . Sexually Abused: Not on file    Physical Exam Cardiovascular:     Rate and Rhythm: Normal rate and regular rhythm.     Pulses: Normal pulses.  Pulmonary:     Effort: Pulmonary effort is normal.     Breath sounds: Normal breath sounds.  Abdominal:     General: There is no distension.  Musculoskeletal:        General: Normal range of motion.     Right lower leg: No edema.     Left lower leg: No edema.  Skin:    General: Skin is warm and dry.     Capillary Refill: Capillary refill takes less than 2 seconds.  Neurological:     Mental Status: She is alert and oriented to person, place, and time.  Psychiatric:        Mood and Affect: Mood normal.         No future appointments.  BP 111/67 (BP Location: Left Arm, Patient Position: Sitting, Cuff Size: Normal)   Pulse 82   Resp 16   Wt 179 lb 9.6 oz (81.5  kg)   SpO2 98%   BMI 31.81 kg/m   Weight yesterday- did not weigh Last visit weight-183 lb  Ms Palmiter was seen at home today and reported feeling well. She denied chest pain, SOB, headache, dizziness, orthopnea, fever or cough since our last visit. She stated she has been compliant with her medications over the past two weeks and her weight has been stable. Her medications were verified and her pillbox was refilled. I will follow up in two weeks.   [name], EMT 09/16/19  ACTION: Home visit completed Next visit planned for 2 weeks

## 2019-09-30 ENCOUNTER — Other Ambulatory Visit (HOSPITAL_COMMUNITY): Payer: Self-pay

## 2019-09-30 NOTE — Progress Notes (Signed)
Paramedicine Encounter    Patient ID: Michelle Andrade, female    DOB: [birth year], 59 y.o.   MRN: [id number]   Patient Care Team: Henderson Baltimore, [name] as PCP - General (Nurse Practitioner) Jorge Ny, LCSW as Social Worker (Licensed Clinical Social Worker)  Patient Active Problem List   Diagnosis Date Noted  . Secondary hyperparathyroidism of renal origin ([place]) 03/20/2018  . Severe obesity (BMI 35.0-39.9) with comorbidity ([place]) 12/19/2017  . CKD (chronic kidney disease) stage 4, GFR 15-29 ml/min (HCC) 06/07/2017  . Acute on chronic systolic (congestive) heart failure ([place])   . CHF (congestive heart failure) ([place]) 02/09/2017  . Pulmonary vascular congestion 02/09/2017  . Hypertension   . Sleep apnea   . Chondrocalcinosis of both knees 02/08/2017  . Primary osteoarthritis of both knees 02/08/2017  . Type 2 diabetes mellitus ([place]) 11/16/2016  . Borderline personality disorder ([place]) 10/17/2016  . Carpal tunnel syndrome 10/17/2016  . 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 Eczema 07/03/2016  . Atrial tachycardia ([place]) 05/31/2016  . 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 Onychomycosis of toenail 06/08/2015  . Cardiomyopathy ([place]) 06/07/2015  . Irritable bowel syndrome 09/27/2014  . PAD (peripheral artery disease) ([place]) 01/12/2014  . History of DVT (deep vein thrombosis) 01/12/2014  . Acute gout of right knee 12/29/2013  . Iron deficiency anemia 12/29/2013  . Lichenification and lichen simplex chronicus 12/26/2013  . Hyperlipidemia associated with type 2 diabetes mellitus ([place]) 11/10/2013  . Benign hypertension with chronic kidney disease, stage IV ([place]) 11/10/2013  . Disorder of magnesium metabolism 10/13/2013  . CAD in native artery 09/01/2013  . Anxiety state 07/27/2013  . Depressive disorder 07/27/2013  . Gastro-esophageal reflux disease without esophagitis 07/27/2013  . Low back  pain 06/03/2013  . Mitral valve disease 06/03/2013  . Pulmonary valve disorder 06/03/2013  . Tricuspid valve disorder 06/03/2013  . 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 Allergic rhinitis 02/07/2013  . 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 Pain in joint 02/07/2013    Current Outpatient Medications:  .  allopurinol (ZYLOPRIM) 100 MG tablet, Take 100 mg by mouth daily., Disp: , Rfl:  .  amiodarone (PACERONE) 200 MG tablet, Take 1 tablet (200 mg total) by mouth daily., Disp: 30 tablet, Rfl: 3 .  aspirin 81 MG chewable tablet, Chew 81 mg by mouth daily., Disp: , Rfl:  .  atorvastatin (LIPITOR) 40 MG tablet, Take 40 mg by mouth daily. , Disp: , Rfl:  .  Calcium Carbonate-Vitamin D 600-400 MG-UNIT tablet, Take by mouth., Disp: , Rfl:  .  Calcium Carbonate-Vitamin D3 (CALCIUM 600/VITAMIN D) 600-400 MG-UNIT TABS, Take 1 tablet by mouth 2 (two) times daily. , Disp: , Rfl:  .  carvedilol (COREG) 25 MG tablet, Take 12.5 mg by mouth 2 (two) times daily with a meal., Disp: , Rfl:  .  colchicine 0.6 MG tablet, Take 2 tablets now and 1 an hour later, Disp: , Rfl:  .  diclofenac sodium (VOLTAREN) 1 % GEL, Apply 2 g topically 4 (four) times daily., Disp: 100 g, Rfl: 0 .  ferrous sulfate 325 (65 FE) MG tablet, Take 325 mg by mouth 3 (three) times daily. , Disp: , Rfl:  .  glipiZIDE (GLUCOTROL) 5 MG tablet, Take 2.5 mg by mouth daily before breakfast., Disp: , Rfl:  .  hydrALAZINE (APRESOLINE) 50 MG tablet, Take 1.5 tablets (75 mg total) by mouth 3 (three) times daily., Disp: 405 tablet, Rfl: 3 .  isosorbide mononitrate (IMDUR) 60 MG 24 hr tablet, Take 1 tablet (60 mg total) by mouth daily., Disp: 30 tablet, Rfl: 0 .  loratadine (CLARITIN) 10 MG tablet, Take 10 mg by mouth daily., Disp: , Rfl:  .  Magnesium Oxide 420 MG TABS, Take 840 mg by mouth daily., Disp: , Rfl:  .  Multiple Vitamin (MULTIVITAMIN) tablet, Take 1 tablet by mouth daily., Disp: , Rfl:  .  omeprazole (PRILOSEC) 40 MG capsule, Take 40 mg  by mouth daily., Disp: , Rfl:  .  potassium chloride (KLOR-CON) 10 MEQ tablet, Take 4 tablets (40 mEq total) by mouth daily., Disp: 240 tablet, Rfl: 11 .  rOPINIRole (REQUIP) 0.5 MG tablet, Take 0.5 mg by mouth at bedtime. , Disp: , Rfl:  .  sertraline (ZOLOFT) 100 MG tablet, Take 150 mg by mouth daily., Disp: , Rfl:  .  torsemide (DEMADEX) 20 MG tablet, Take 1 tablet (20 mg total) by mouth 2 (two) times daily., Disp: 120 tablet, Rfl: 3 .  Investigational - Study Medication, Take 1 tablet by mouth 2 (two) times daily. Study name: [facility] Heart Failure Study Additional study details: Omecamtiv Mecarbil or Placebo (Patient not taking: Reported on 03/18/2019), Disp: 1 each, Rfl: PRN Allergies  Allergen Reactions  . Codeine Itching  . Gabapentin Itching  . Haldol [Haloperidol Lactate] Other (See Comments)    Dizziness, hallucinations  . Levofloxacin     Other reaction(s): Other (See Comments) Projectile vomiting   . Lisinopril Itching and Swelling  . Losartan Itching  . Morphine And Related Nausea And Vomiting  . Penicillins Nausea And Vomiting    "Vomiting, upset stomach, Headache" Has patient had a PCN reaction causing immediate rash, facial/tongue/throat swelling, SOB or lightheadedness with hypotension: No Has patient had a PCN reaction causing severe rash involving mucus membranes or skin necrosis: No Has patient had a PCN reaction that required hospitalization: No Has patient had a PCN reaction occurring within the last 10 years: No If all of the above answers are "NO", then may proceed with Cephalosporin use.       Social History   Socioeconomic History  . Marital status: Single    Spouse name: Not on file  . Number of children: Not on file  . Years of education: Not on file  . Highest education level: Not on file  Occupational History  . Not on file  Tobacco Use  . Smoking status: Never Smoker  . Smokeless tobacco: Never Used  Substance and Sexual Activity  . Alcohol  use: No  . Drug use: No  . Sexual activity: Not on file  Other Topics Concern  . Not on file  Social History Narrative  . Not on file   Social Determinants of Health   Financial Resource Strain:   . Difficulty of Paying Living Expenses: Not on file  Food Insecurity:   . Worried About Charity fundraiser in the Last Year: Not on file  . Ran Out of Food in the Last Year: Not on file  Transportation Needs:   . Lack of Transportation (Medical): Not on file  . Lack of Transportation (Non-Medical): Not on file  Physical Activity:   . Days of Exercise per Week: Not on file  . Minutes of Exercise per Session: Not on file  Stress:   . Feeling of Stress : Not on file  Social Connections:   . Frequency of Communication with Friends and Family: Not on file  . Frequency of Social Gatherings with  Friends and Family: Not on file  . Attends Religious Services: Not on file  . Active Member of Clubs or Organizations: Not on file  . Attends Archivist Meetings: Not on file  . Marital Status: Not on file  Intimate Partner Violence:   . Fear of Current or Ex-Partner: Not on file  . Emotionally Abused: Not on file  . Physically Abused: Not on file  . Sexually Abused: Not on file    Physical Exam      No future appointments.  BP 120/68   Pulse 82   Temp (!) 97.5 F (36.4 C)   Resp 16   Wt 179 lb (81.2 kg)   SpO2 98%   BMI 31.71 kg/m   Weight yesterday-180 Last visit weight-179  Pt reports feeling good, she denies any sob, no dizziness, no h/a, no c/p. She states she felt like she was getting a cold last night b/c she sneezed once but no other issues since then.  She missed one dose of her evening meds.  meds verified and 2 pill boxes refilled.  --she is out of her amio from [place] but has bottle from [facility].  -she said allopurinol was prescribed by her kidney doc.  -she will call in carvedilol to refill    Marylouise Stacks, [place]  Paramedic  09/30/19

## 2019-10-14 ENCOUNTER — Other Ambulatory Visit (HOSPITAL_COMMUNITY): Payer: Self-pay

## 2019-10-14 NOTE — Progress Notes (Signed)
Paramedicine Encounter    Patient ID: Michelle Andrade, female    DOB: 22-Jan-1961, 59 y.o.   MRN: [id number]   Patient Care Team: Henderson Baltimore, [name] as PCP - General (Nurse Practitioner) Jorge Ny, LCSW as Social Worker (Licensed Clinical Social Worker)  Patient Active Problem List   Diagnosis Date Noted  . Secondary hyperparathyroidism of renal origin ([place]) 03/20/2018  . Severe obesity (BMI 35.0-39.9) with comorbidity ([place]) 12/19/2017  . CKD (chronic kidney disease) stage 4, GFR 15-29 ml/min (HCC) 06/07/2017  . Acute on chronic systolic (congestive) heart failure ([place])   . CHF (congestive heart failure) ([place]) 02/09/2017  . Pulmonary vascular congestion 02/09/2017  . Hypertension   . Sleep apnea   . Chondrocalcinosis of both knees 02/08/2017  . Primary osteoarthritis of both knees 02/08/2017  . Type 2 diabetes mellitus ([place]) 11/16/2016  . Borderline personality disorder ([place]) 10/17/2016  . Carpal tunnel syndrome 10/17/2016  . 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 Eczema 07/03/2016  . Atrial tachycardia ([place]) 05/31/2016  . 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 Onychomycosis of toenail 06/08/2015  . Cardiomyopathy ([place]) 06/07/2015  . Irritable bowel syndrome 09/27/2014  . PAD (peripheral artery disease) ([place]) 01/12/2014  . History of DVT (deep vein thrombosis) 01/12/2014  . Acute gout of right knee 12/29/2013  . Iron deficiency anemia 12/29/2013  . Lichenification and lichen simplex chronicus 12/26/2013  . Hyperlipidemia associated with type 2 diabetes mellitus ([place]) 11/10/2013  . Benign hypertension with chronic kidney disease, stage IV ([place]) 11/10/2013  . Disorder of magnesium metabolism 10/13/2013  . CAD in native artery 09/01/2013  . Anxiety state 07/27/2013  . Depressive disorder 07/27/2013  . Gastro-esophageal reflux disease without esophagitis 07/27/2013  . Low back  pain 06/03/2013  . Mitral valve disease 06/03/2013  . Pulmonary valve disorder 06/03/2013  . Tricuspid valve disorder 06/03/2013  . 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 Allergic rhinitis 02/07/2013  . 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 Pain in joint 02/07/2013    Current Outpatient Medications:  .  amiodarone (PACERONE) 200 MG tablet, Take 1 tablet (200 mg total) by mouth daily., Disp: 30 tablet, Rfl: 3 .  aspirin 81 MG chewable tablet, Chew 81 mg by mouth daily., Disp: , Rfl:  .  atorvastatin (LIPITOR) 40 MG tablet, Take 40 mg by mouth daily. , Disp: , Rfl:  .  Calcium Carbonate-Vitamin D3 (CALCIUM 600/VITAMIN D) 600-400 MG-UNIT TABS, Take 1 tablet by mouth 2 (two) times daily. , Disp: , Rfl:  .  carvedilol (COREG) 25 MG tablet, Take 12.5 mg by mouth 2 (two) times daily with a meal., Disp: , Rfl:  .  diclofenac sodium (VOLTAREN) 1 % GEL, Apply 2 g topically 4 (four) times daily., Disp: 100 g, Rfl: 0 .  ferrous sulfate 325 (65 FE) MG tablet, Take 325 mg by mouth 3 (three) times daily. , Disp: , Rfl:  .  glipiZIDE (GLUCOTROL) 5 MG tablet, Take 2.5 mg by mouth daily before breakfast., Disp: , Rfl:  .  hydrALAZINE (APRESOLINE) 50 MG tablet, Take 1.5 tablets (75 mg total) by mouth 3 (three) times daily., Disp: 405 tablet, Rfl: 3 .  isosorbide mononitrate (IMDUR) 60 MG 24 hr tablet, Take 1 tablet (60 mg total) by mouth daily., Disp: 30 tablet, Rfl: 0 .  loratadine (CLARITIN) 10 MG tablet, Take 10 mg by mouth daily., Disp: , Rfl:  .  Magnesium Oxide 420 MG TABS, Take 840 mg by mouth  daily., Disp: , Rfl:  .  Multiple Vitamin (MULTIVITAMIN) tablet, Take 1 tablet by mouth daily., Disp: , Rfl:  .  omeprazole (PRILOSEC) 40 MG capsule, Take 40 mg by mouth daily., Disp: , Rfl:  .  potassium chloride (KLOR-CON) 10 MEQ tablet, Take 4 tablets (40 mEq total) by mouth daily., Disp: 240 tablet, Rfl: 11 .  rOPINIRole (REQUIP) 0.5 MG tablet, Take 0.5 mg by mouth at bedtime. , Disp: , Rfl:  .  sertraline  (ZOLOFT) 100 MG tablet, Take 150 mg by mouth daily., Disp: , Rfl:  .  torsemide (DEMADEX) 20 MG tablet, Take 1 tablet (20 mg total) by mouth 2 (two) times daily., Disp: 120 tablet, Rfl: 3 .  allopurinol (ZYLOPRIM) 100 MG tablet, Take 100 mg by mouth daily., Disp: , Rfl:  .  Calcium Carbonate-Vitamin D 600-400 MG-UNIT tablet, Take by mouth., Disp: , Rfl:  .  colchicine 0.6 MG tablet, Take 2 tablets now and 1 an hour later, Disp: , Rfl:  .  Investigational - Study Medication, Take 1 tablet by mouth 2 (two) times daily. Study name: Galactic Heart Failure Study Additional study details: Omecamtiv Mecarbil or Placebo (Patient not taking: Reported on 03/18/2019), Disp: 1 each, Rfl: PRN Allergies  Allergen Reactions  . Codeine Itching  . Gabapentin Itching  . Haldol [Haloperidol Lactate] Other (See Comments)    Dizziness, hallucinations  . Levofloxacin     Other reaction(s): Other (See Comments) Projectile vomiting   . Lisinopril Itching and Swelling  . Losartan Itching  . Morphine And Related Nausea And Vomiting  . Penicillins Nausea And Vomiting    "Vomiting, upset stomach, Headache" Has patient had a PCN reaction causing immediate rash, facial/tongue/throat swelling, SOB or lightheadedness with hypotension: No Has patient had a PCN reaction causing severe rash involving mucus membranes or skin necrosis: No Has patient had a PCN reaction that required hospitalization: No Has patient had a PCN reaction occurring within the last 10 years: No If all of the above answers are "NO", then may proceed with Cephalosporin use.       Social History   Socioeconomic History  . Marital status: Single    Spouse name: Not on file  . Number of children: Not on file  . Years of education: Not on file  . Highest education level: Not on file  Occupational History  . Not on file  Tobacco Use  . Smoking status: Never Smoker  . Smokeless tobacco: Never Used  Substance and Sexual Activity  . Alcohol  use: No  . Drug use: No  . Sexual activity: Not on file  Other Topics Concern  . Not on file  Social History Narrative  . Not on file   Social Determinants of Health   Financial Resource Strain:   . Difficulty of Paying Living Expenses: Not on file  Food Insecurity:   . Worried About Charity fundraiser in the Last Year: Not on file  . Ran Out of Food in the Last Year: Not on file  Transportation Needs:   . Lack of Transportation (Medical): Not on file  . Lack of Transportation (Non-Medical): Not on file  Physical Activity:   . Days of Exercise per Week: Not on file  . Minutes of Exercise per Session: Not on file  Stress:   . Feeling of Stress : Not on file  Social Connections:   . Frequency of Communication with Friends and Family: Not on file  . Frequency of Social Gatherings with  Friends and Family: Not on file  . Attends Religious Services: Not on file  . Active Member of Clubs or Organizations: Not on file  . Attends Archivist Meetings: Not on file  . Marital Status: Not on file  Intimate Partner Violence:   . Fear of Current or Ex-Partner: Not on file  . Emotionally Abused: Not on file  . Physically Abused: Not on file  . Sexually Abused: Not on file    Physical Exam Cardiovascular:     Rate and Rhythm: Normal rate and regular rhythm.     Pulses: Normal pulses.  Pulmonary:     Effort: Pulmonary effort is normal.     Breath sounds: Normal breath sounds.  Musculoskeletal:        General: Normal range of motion.     Right lower leg: No edema.     Left lower leg: No edema.  Skin:    General: Skin is warm and dry.     Capillary Refill: Capillary refill takes less than 2 seconds.  Neurological:     Mental Status: She is alert and oriented to person, place, and time.  Psychiatric:        Mood and Affect: Mood normal.         No future appointments.  BP 122/60 (BP Location: Left Arm, Patient Position: Sitting, Cuff Size: Normal)   Pulse 74    Resp 16   SpO2 96%   Weight yesterday- did not weigh Last visit weight- 179 lb  Michelle Andrade was seen at home today and reported feeling well with the exception of a moderate gout flare up. She denied chest pain, SOB, headache. Dizziness, orthopnea, fever or cough since our last visit. She stated she has been compliant with her medications over the past week and her weight had been stable previous to the past three days however she has not weighed herself due to difficulty standing from gout. Her medications were verified and her pillbox was refilled. I will follow up in two weeks.   Michelle Andrade, EMT 10/14/19  ACTION: Home visit completed Next visit planned for 1 week

## 2019-10-27 ENCOUNTER — Telehealth (HOSPITAL_COMMUNITY): Payer: Self-pay

## 2019-10-27 NOTE — Telephone Encounter (Signed)
Ms Michelle Andrade me this morning and advised that she needed her visit tomorrow to be in the morning because she has an appointment for her COVID vaccination. We agreed to meet in the morning around 09:30 or 10:00.   [name], EMT 10/27/19

## 2019-10-28 ENCOUNTER — Telehealth (HOSPITAL_COMMUNITY): Payer: Self-pay

## 2019-10-28 ENCOUNTER — Other Ambulatory Visit (HOSPITAL_COMMUNITY): Payer: Self-pay

## 2019-10-28 DIAGNOSIS — Z95810 Presence of automatic (implantable) cardiac defibrillator: Secondary | ICD-10-CM

## 2019-10-28 NOTE — Progress Notes (Signed)
Paramedicine Encounter    Patient ID: Michelle Andrade, female    DOB: October 19, 1960, 59 y.o.   MRN: [id number]   Patient Care Team: Henderson Baltimore, [name] as PCP - General (Nurse Practitioner) Jorge Ny, LCSW as Social Worker (Licensed Clinical Social Worker)  Patient Active Problem List   Diagnosis Date Noted  . Secondary hyperparathyroidism of renal origin ([place]) 03/20/2018  . Severe obesity (BMI 35.0-39.9) with comorbidity ([place]) 12/19/2017  . CKD (chronic kidney disease) stage 4, GFR 15-29 ml/min (HCC) 06/07/2017  . Acute on chronic systolic (congestive) heart failure ([place])   . CHF (congestive heart failure) ([place]) 02/09/2017  . Pulmonary vascular congestion 02/09/2017  . Hypertension   . Sleep apnea   . Chondrocalcinosis of both knees 02/08/2017  . Primary osteoarthritis of both knees 02/08/2017  . Type 2 diabetes mellitus ([place]) 11/16/2016  . Borderline personality disorder ([place]) 10/17/2016  . Carpal tunnel syndrome 10/17/2016  . 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 Eczema 07/03/2016  . Atrial tachycardia ([place]) 05/31/2016  . 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 Onychomycosis of toenail 06/08/2015  . Cardiomyopathy ([place]) 06/07/2015  . Irritable bowel syndrome 09/27/2014  . PAD (peripheral artery disease) ([place]) 01/12/2014  . History of DVT (deep vein thrombosis) 01/12/2014  . Acute gout of right knee 12/29/2013  . Iron deficiency anemia 12/29/2013  . Lichenification and lichen simplex chronicus 12/26/2013  . Hyperlipidemia associated with type 2 diabetes mellitus ([place]) 11/10/2013  . Benign hypertension with chronic kidney disease, stage IV ([place]) 11/10/2013  . Disorder of magnesium metabolism 10/13/2013  . CAD in native artery 09/01/2013  . Anxiety state 07/27/2013  . Depressive disorder 07/27/2013  . Gastro-esophageal reflux disease without esophagitis 07/27/2013  . Low back  pain 06/03/2013  . Mitral valve disease 06/03/2013  . Pulmonary valve disorder 06/03/2013  . Tricuspid valve disorder 06/03/2013  . 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 Allergic rhinitis 02/07/2013  . 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 Pain in joint 02/07/2013    Current Outpatient Medications:  .  allopurinol (ZYLOPRIM) 100 MG tablet, Take 100 mg by mouth daily., Disp: , Rfl:  .  amiodarone (PACERONE) 200 MG tablet, Take 1 tablet (200 mg total) by mouth daily., Disp: 30 tablet, Rfl: 3 .  aspirin 81 MG chewable tablet, Chew 81 mg by mouth daily., Disp: , Rfl:  .  atorvastatin (LIPITOR) 40 MG tablet, Take 40 mg by mouth daily. , Disp: , Rfl:  .  Calcium Carbonate-Vitamin D3 (CALCIUM 600/VITAMIN D) 600-400 MG-UNIT TABS, Take 1 tablet by mouth 2 (two) times daily. , Disp: , Rfl:  .  carvedilol (COREG) 25 MG tablet, Take 12.5 mg by mouth 2 (two) times daily with a meal., Disp: , Rfl:  .  colchicine 0.6 MG tablet, Take 2 tablets now and 1 an hour later, Disp: , Rfl:  .  diclofenac sodium (VOLTAREN) 1 % GEL, Apply 2 g topically 4 (four) times daily., Disp: 100 g, Rfl: 0 .  ferrous sulfate 325 (65 FE) MG tablet, Take 325 mg by mouth 3 (three) times daily. , Disp: , Rfl:  .  glipiZIDE (GLUCOTROL) 5 MG tablet, Take 2.5 mg by mouth daily before breakfast., Disp: , Rfl:  .  hydrALAZINE (APRESOLINE) 50 MG tablet, Take 1.5 tablets (75 mg total) by mouth 3 (three) times daily., Disp: 405 tablet, Rfl: 3 .  isosorbide mononitrate (IMDUR) 60 MG 24 hr tablet, Take 1 tablet (60 mg total) by  mouth daily., Disp: 30 tablet, Rfl: 0 .  loratadine (CLARITIN) 10 MG tablet, Take 10 mg by mouth daily., Disp: , Rfl:  .  Magnesium Oxide 420 MG TABS, Take 840 mg by mouth daily., Disp: , Rfl:  .  Multiple Vitamin (MULTIVITAMIN) tablet, Take 1 tablet by mouth daily., Disp: , Rfl:  .  omeprazole (PRILOSEC) 40 MG capsule, Take 40 mg by mouth daily., Disp: , Rfl:  .  potassium chloride (KLOR-CON) 10 MEQ tablet, Take 4  tablets (40 mEq total) by mouth daily., Disp: 240 tablet, Rfl: 11 .  rOPINIRole (REQUIP) 0.5 MG tablet, Take 0.5 mg by mouth at bedtime. , Disp: , Rfl:  .  sertraline (ZOLOFT) 100 MG tablet, Take 150 mg by mouth daily., Disp: , Rfl:  .  torsemide (DEMADEX) 20 MG tablet, Take 1 tablet (20 mg total) by mouth 2 (two) times daily., Disp: 120 tablet, Rfl: 3 .  Calcium Carbonate-Vitamin D 600-400 MG-UNIT tablet, Take by mouth., Disp: , Rfl:  .  Investigational - Study Medication, Take 1 tablet by mouth 2 (two) times daily. Study name: Galactic Heart Failure Study Additional study details: Omecamtiv Mecarbil or Placebo (Patient not taking: Reported on 03/18/2019), Disp: 1 each, Rfl: PRN Allergies  Allergen Reactions  . Codeine Itching  . Gabapentin Itching  . Haldol [Haloperidol Lactate] Other (See Comments)    Dizziness, hallucinations  . Levofloxacin     Other reaction(s): Other (See Comments) Projectile vomiting   . Lisinopril Itching and Swelling  . Losartan Itching  . Morphine And Related Nausea And Vomiting  . Penicillins Nausea And Vomiting    "Vomiting, upset stomach, Headache" Has patient had a PCN reaction causing immediate rash, facial/tongue/throat swelling, SOB or lightheadedness with hypotension: No Has patient had a PCN reaction causing severe rash involving mucus membranes or skin necrosis: No Has patient had a PCN reaction that required hospitalization: No Has patient had a PCN reaction occurring within the last 10 years: No If all of the above answers are "NO", then may proceed with Cephalosporin use.       Social History   Socioeconomic History  . Marital status: Single    Spouse name: Not on file  . Number of children: Not on file  . Years of education: Not on file  . Highest education level: Not on file  Occupational History  . Not on file  Tobacco Use  . Smoking status: Never Smoker  . Smokeless tobacco: Never Used  Substance and Sexual Activity  . Alcohol  use: No  . Drug use: No  . Sexual activity: Not on file  Other Topics Concern  . Not on file  Social History Narrative  . Not on file   Social Determinants of Health   Financial Resource Strain:   . Difficulty of Paying Living Expenses: Not on file  Food Insecurity:   . Worried About Charity fundraiser in the Last Year: Not on file  . Ran Out of Food in the Last Year: Not on file  Transportation Needs:   . Lack of Transportation (Medical): Not on file  . Lack of Transportation (Non-Medical): Not on file  Physical Activity:   . Days of Exercise per Week: Not on file  . Minutes of Exercise per Session: Not on file  Stress:   . Feeling of Stress : Not on file  Social Connections:   . Frequency of Communication with Friends and Family: Not on file  . Frequency of Social Gatherings with  Friends and Family: Not on file  . Attends Religious Services: Not on file  . Active Member of Clubs or Organizations: Not on file  . Attends Archivist Meetings: Not on file  . Marital Status: Not on file  Intimate Partner Violence:   . Fear of Current or Ex-Partner: Not on file  . Emotionally Abused: Not on file  . Physically Abused: Not on file  . Sexually Abused: Not on file    Physical Exam Cardiovascular:     Rate and Rhythm: Normal rate and regular rhythm.     Pulses: Normal pulses.  Pulmonary:     Effort: Pulmonary effort is normal.     Breath sounds: Normal breath sounds.  Abdominal:     General: There is no distension.  Musculoskeletal:        General: Normal range of motion.     Right lower leg: No edema.     Left lower leg: No edema.  Skin:    General: Skin is warm and dry.     Capillary Refill: Capillary refill takes less than 2 seconds.  Neurological:     Mental Status: She is alert and oriented to person, place, and time.  Psychiatric:        Mood and Affect: Mood normal.         No future appointments.  BP 130/70 (BP Location: Left Arm, Patient  Position: Sitting, Cuff Size: Normal)   Pulse 69   Resp 16   Wt 179 lb (81.2 kg)   SpO2 96%   BMI 31.71 kg/m   Weight yesterday- did not weigh Last visit weight- 179 lb  Ms Hirth was seen at home today and rpeorted feeling well. She denied chest pain, SOB, headache, dizziness, orthopnea, fever or cough since our last visit. She stated she has been compliant with her medications over the past week and her weight has been stable. Her medications were verified and her pillbox was refilled. She asked to be referred to an EP with [facility] because she has not had a follow up on her ICD in >5 years. She stated the device was implanted at [facility] but she is more comfortable seeing the [facility] moving forward. I contacted the HF clinic and [place], advised she would place the referral. I will follow up in two weeks.   [name], EMT 10/28/19  ACTION: Home visit completed Next visit planned for 2 weeks

## 2019-10-28 NOTE — Telephone Encounter (Signed)
Orders Placed This Encounter  Procedures  . Ambulatory referral to Cardiac Electrophysiology    Referral Priority:   Routine    Referral Type:   Consultation    Referral Reason:   Specialty Services Required    Requested Specialty:   Cardiology    Number of Visits Requested:   1

## 2019-11-05 ENCOUNTER — Encounter: Payer: Self-pay | Admitting: Cardiology

## 2019-11-05 ENCOUNTER — Other Ambulatory Visit: Payer: Self-pay

## 2019-11-05 ENCOUNTER — Ambulatory Visit (INDEPENDENT_AMBULATORY_CARE_PROVIDER_SITE_OTHER): Payer: Medicare Other | Admitting: Cardiology

## 2019-11-05 VITALS — BP 134/62 | HR 65 | Ht 63.0 in | Wt 182.0 lb

## 2019-11-05 DIAGNOSIS — I428 Other cardiomyopathies: Secondary | ICD-10-CM | POA: Diagnosis not present

## 2019-11-05 DIAGNOSIS — Z79899 Other long term (current) drug therapy: Secondary | ICD-10-CM | POA: Diagnosis not present

## 2019-11-05 LAB — HEPATIC FUNCTION PANEL
ALT: 28 IU/L (ref 0–32)
AST: 37 IU/L (ref 0–40)
Albumin: 3.7 g/dL — ABNORMAL LOW (ref 3.8–4.9)
Alkaline Phosphatase: 146 IU/L — ABNORMAL HIGH (ref 39–117)
Bilirubin Total: 0.2 mg/dL (ref 0.0–1.2)
Bilirubin, Direct: 0.1 mg/dL (ref 0.00–0.40)
Total Protein: 6.8 g/dL (ref 6.0–8.5)

## 2019-11-05 LAB — TSH: TSH: 1.83 u[IU]/mL (ref 0.450–4.500)

## 2019-11-05 NOTE — Progress Notes (Signed)
Electrophysiology Office Note   Date:  11/05/2019   ID:  Michelle Andrade, DOB [birth year], MRN [id number]  PCP:  Henderson Baltimore, [name]  Cardiologist:  [facility] Primary Electrophysiologist:  Constance Haw, MD    Chief Complaint: CHF   History of Present Illness: Michelle Andrade is a 59 y.o. female who is being seen today for the evaluation of CHF at the request of Bensimhon, Shaune Pascal, MD. Presenting today for electrophysiology evaluation.  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  She is status post [place] CRT-D.  Today, she denies symptoms of palpitations, chest pain, shortness of breath, orthopnea, PND, lower extremity edema, claudication, dizziness, presyncope, syncope, bleeding, or neurologic sequela. The patient is tolerating medications without difficulties.    Past Medical History:  Diagnosis Date  . Arthritis 02/08/2017  . 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 CAD (coronary artery disease) 09/01/2013  . CHF (congestive heart failure) ([place]) 06/07/2015  . CKD (chronic kidney disease), stage III 12/09/2013  . Depression 07/27/2013  . Diabetes mellitus without complication ([place]) [id number]  . GERD (gastroesophageal reflux disease) 07/27/2013  . Gout 12/29/2013  . Heart valve disorder 06/03/2013  . High cholesterol 11/10/2013  . Hypertension 11/10/2013  . IBS (irritable bowel syndrome) 09/27/2014  . Myocardial infarct ([place])   . Sleep apnea 10/13/2013  . Vascular disorder 01/12/2014   Past Surgical History:  Procedure Laterality Date  . ABDOMINAL HYSTERECTOMY    . CARDIAC DEFIBRILLATOR PLACEMENT    . CARPAL TUNNEL RELEASE    . HEEL SPUR EXCISION    . KNEE SURGERY    . PACEMAKER GENERATOR CHANGE    . RIGHT HEART CATH N/A 02/13/2017   Procedure: Right Heart  Cath;  Surgeon: Jolaine Artist, MD;  Location: [facility];  Service: Cardiovascular;  Laterality: N/A;  . SHOULDER SURGERY       Current Outpatient Medications  Medication Sig Dispense Refill  . allopurinol (ZYLOPRIM) 100 MG tablet Take 100 mg by mouth daily.    [name] amiodarone (PACERONE) 200 MG tablet Take 1 tablet (200 mg total) by mouth daily. 30 tablet 3  . aspirin 81 MG chewable tablet Chew 81 mg by mouth daily.    [name] atorvastatin (LIPITOR) 40 MG tablet Take 40 mg by mouth daily.     . Calcium Carbonate-Vitamin D 600-400 MG-UNIT tablet Take by mouth.    . Calcium Carbonate-Vitamin D3 (CALCIUM 600/VITAMIN D) 600-400 MG-UNIT TABS Take 1 tablet by mouth 2 (two) times daily.     . carvedilol (COREG) 25 MG tablet Take 12.5 mg by mouth 2 (two) times daily with a meal.    . colchicine 0.6 MG tablet Take 2 tablets now and 1 an hour later    . diclofenac sodium (VOLTAREN) 1 % GEL Apply 2 g topically 4 (four) times daily. 100 g 0  . ferrous sulfate 325 (65 FE) MG tablet Take 325 mg by mouth 3 (three) times daily.     [name] glipiZIDE (GLUCOTROL) 5 MG tablet Take 2.5 mg by mouth daily before breakfast.    . hydrALAZINE (APRESOLINE) 50 MG tablet Take 1.5 tablets (75 mg total) by mouth 3 (three) times daily. 405 tablet 3  . Investigational - Study Medication Take 1 tablet by mouth 2 (two) times daily. Study name: Galactic Heart Failure Study Additional study details: Omecamtiv Mecarbil or Placebo 1  each PRN  . isosorbide mononitrate (IMDUR) 60 MG 24 hr tablet Take 1 tablet (60 mg total) by mouth daily. 30 tablet 0  . loratadine (CLARITIN) 10 MG tablet Take 10 mg by mouth daily.    . Magnesium Oxide 420 MG TABS Take 840 mg by mouth daily.    . Multiple Vitamin (MULTIVITAMIN) tablet Take 1 tablet by mouth daily.    [name] omeprazole (PRILOSEC) 40 MG capsule Take 40 mg by mouth daily.    . potassium chloride (KLOR-CON) 10 MEQ tablet Take 4 tablets (40 mEq total) by mouth daily. 240 tablet 11  .  rOPINIRole (REQUIP) 0.5 MG tablet Take 0.5 mg by mouth at bedtime.     . sertraline (ZOLOFT) 100 MG tablet Take 150 mg by mouth daily.    [name] torsemide (DEMADEX) 20 MG tablet Take 1 tablet (20 mg total) by mouth 2 (two) times daily. 120 tablet 3   No current facility-administered medications for this visit.    Allergies:   Codeine, Gabapentin, Haldol [haloperidol lactate], Levofloxacin, Lisinopril, Losartan, Morphine and related, and Penicillins   Social History:  The patient  reports that she has never smoked. She has never used smokeless tobacco. She reports that she does not drink alcohol or use drugs.   Family History:  The patient's family history includes Hypertension in her mother.    ROS:  Please see the history of present illness.   Otherwise, review of systems is positive for none.   All other systems are reviewed and negative.    PHYSICAL EXAM: VS:  BP 134/62   Pulse 65   Ht 5\' 3"  (1.6 m)   Wt 182 lb (82.6 kg)   SpO2 92%   BMI 32.24 kg/m  , BMI Body mass index is 32.24 kg/m. GEN: Well nourished, well developed, in no acute distress  HEENT: normal  Neck: no JVD, carotid bruits, or masses Cardiac: RRR; no murmurs, rubs, or gallops,no edema  Respiratory:  clear to auscultation bilaterally, normal work of breathing GI: soft, nontender, nondistended, + BS MS: no deformity or atrophy  Skin: warm and dry, device pocket is well healed Neuro:  Strength and sensation are intact Psych: euthymic mood, full affect  EKG:  EKG is ordered today. Personal review of the ekg ordered shows sinus rhythm, V paced  Device interrogation is reviewed today in detail.  See PaceArt for details.   Recent Labs: 11/13/2018: ALT 18; TSH 2.081 07/22/2019: BUN 27; Creatinine, Ser 2.22; Potassium 3.5; Sodium 137    Lipid Panel  No results found for: CHOL, TRIG, HDL, CHOLHDL, VLDL, LDLCALC, LDLDIRECT   Wt Readings from Last 3 Encounters:  11/05/19 182 lb (82.6 kg)  10/28/19 179 lb (81.2 kg)   09/30/19 179 lb (81.2 kg)      Other studies Reviewed: Additional studies/ records that were reviewed today include: TTE 11/05/18  Review of the above records today demonstrates:  1. The left ventricle has moderate-severely reduced systolic function,  with an ejection fraction of 30-35%. The cavity size was normal. Left  ventricular diastolic Doppler parameters are consistent with impaired  relaxation Elevated left ventricular  end-diastolic pressure Left ventricular diffuse hypokinesis.  2. The right ventricle has normal systolic function. The cavity was  normal. There is no increase in right ventricular wall thickness. Right  ventricular systolic pressure normal with an estimated pressure of 26.7  mmHg.  3. Left atrial size was severely dilated.  4. The mitral valve is normal in structure. Mild thickening of  the mitral  valve leaflet. Mild calcification of the anterior mitral valve leaflet.  5. The tricuspid valve is normal in structure.  6. The aortic valve is tricuspid Mild sclerosis of the aortic valve.  7. The pulmonic valve was normal in structure.    ASSESSMENT AND PLAN:  1.  Chronic systolic heart failure due to nonischemic cardiomyopathy currently on optimal medical therapy with Coreg, hydralazine, isosorbide, Aldactone.  Also takes both metolazone and torsemide.  Is status post [employer] CRT-D placed at [facility].  We check with Dr. Blane Ohara office to see if we can switch her remote checks to [place] as she is in a current study.  Otherwise we Courney Garrod see her back in 1 year.  2.  Hypertension: Currently well controlled  3.  Atrial tachycardia: Currently on amiodarone.  Has had minimal tachycardia recently.  Teresa Lemmerman check amiodarone labs today.  Current medicines are reviewed at length with the patient today.   The patient does not have concerns regarding her medicines.  The following changes were made today:  none  Labs/ tests ordered today include:  Orders Placed  This Encounter  Procedures  . EKG 12-Lead     Disposition:   FU with Angele Wiemann 1 year  Signed, Bearett Porcaro Meredith Leeds, MD  11/05/2019 11:21 AM     [facility] [address] [place] [address] [phone] (office) [phone] (fax)

## 2019-11-05 NOTE — Patient Instructions (Addendum)
Medication Instructions:  Your physician recommends that you continue on your current medications as directed. Please refer to the Current Medication list given to you today.  *If you need a refill on your cardiac medications before your next appointment, please call your pharmacy*   Lab Work: Today: TSH & LFTs If you have labs (blood work) drawn today and your tests are completely normal, you will receive your results only by: [name] MyChart Message (if you have MyChart) OR . A paper copy in the mail If you have any lab test that is abnormal or we need to change your treatment, we will call you to review the results.   Testing/Procedures: None ordered   Follow-Up: At [facility], you and your health needs are our priority.  As part of our continuing mission to provide you with exceptional heart care, we have created designated Provider Care Teams.  These Care Teams include your primary Cardiologist (physician) and Advanced Practice Providers (APPs -  Physician Assistants and Nurse Practitioners) who all work together to provide you with the care you need, when you need it.  We recommend signing up for the patient portal called "MyChart".  Sign up information is provided on this After Visit Summary.  MyChart is used to connect with patients for Virtual Visits (Telemedicine).  Patients are able to view lab/test results, encounter notes, upcoming appointments, etc.  Non-urgent messages can be sent to your provider as well.   To learn more about what you can do with MyChart, go to [email or web address].    Your next appointment:   1 year(s)  The format for your next appointment:   In Person  Provider:   Allegra Lai, MD   Thank you for choosing [facility]!!   Trinidad Curet, RN [phone]    Other Instructions We will reach out to Dr. Blane Ohara office to discuss if we can transfer your device into our clinic.

## 2019-11-06 ENCOUNTER — Telehealth: Payer: Self-pay

## 2019-11-06 NOTE — Telephone Encounter (Signed)
LMTCB regarding results.  

## 2019-11-06 NOTE — Telephone Encounter (Signed)
-----  Message from Will Meredith Leeds, MD sent at 11/06/2019  7:52 AM EST ----- Needs to discuss elevated alk phos with PCP otherwise continue amiodarone.

## 2019-11-09 ENCOUNTER — Telehealth: Payer: Self-pay | Admitting: Cardiology

## 2019-11-09 NOTE — Telephone Encounter (Signed)
    Patient calling for lab results 

## 2019-11-11 ENCOUNTER — Other Ambulatory Visit (HOSPITAL_COMMUNITY): Payer: Self-pay

## 2019-11-11 NOTE — Telephone Encounter (Signed)
Discussed results. Pt advised to follow up with PCP and kidney MD (Dr. Audie Clear at [facility]). She will print & take result to both doctors (she sees kidney MD 3/17 and PCP 3/30). Will forward to both MDs.

## 2019-11-11 NOTE — Progress Notes (Signed)
Paramedicine Encounter    Patient ID: Michelle Andrade, female    DOB: 03/13/1961, 59 y.o.   MRN: [id number]   Patient Care Team: Henderson Baltimore, [name] as PCP - General (Nurse Practitioner) Jorge Ny, LCSW as Social Worker (Licensed Clinical Social Worker)  Patient Active Problem List   Diagnosis Date Noted  . Secondary hyperparathyroidism of renal origin ([place]) 03/20/2018  . Severe obesity (BMI 35.0-39.9) with comorbidity ([place]) 12/19/2017  . CKD (chronic kidney disease) stage 4, GFR 15-29 ml/min ([facility]) 06/07/2017  . Acute on chronic systolic (congestive) heart failure ([place])   . CHF (congestive heart failure) ([place]) 02/09/2017  . Pulmonary vascular congestion 02/09/2017  . Hypertension   . Sleep apnea   . Chondrocalcinosis of both knees 02/08/2017  . Primary osteoarthritis of both knees 02/08/2017  . Type 2 diabetes mellitus ([place]) 11/16/2016  . Borderline personality disorder ([place]) 10/17/2016  . Carpal tunnel syndrome 10/17/2016  . 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 Eczema 07/03/2016  . Atrial tachycardia ([place]) 05/31/2016  . 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 Onychomycosis of toenail 06/08/2015  . Cardiomyopathy ([place]) 06/07/2015  . Irritable bowel syndrome 09/27/2014  . PAD (peripheral artery disease) ([place]) 01/12/2014  . History of DVT (deep vein thrombosis) 01/12/2014  . Acute gout of right knee 12/29/2013  . Iron deficiency anemia 12/29/2013  . Lichenification and lichen simplex chronicus 12/26/2013  . Hyperlipidemia associated with type 2 diabetes mellitus ([place]) 11/10/2013  . Benign hypertension with chronic kidney disease, stage IV ([place]) 11/10/2013  . Disorder of magnesium metabolism 10/13/2013  . CAD in native artery 09/01/2013  . Anxiety state 07/27/2013  . Depressive disorder 07/27/2013  . Gastro-esophageal reflux disease without esophagitis 07/27/2013  . Low back  pain 06/03/2013  . Mitral valve disease 06/03/2013  . Pulmonary valve disorder 06/03/2013  . Tricuspid valve disorder 06/03/2013  . 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 Allergic rhinitis 02/07/2013  . 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 Pain in joint 02/07/2013    Current Outpatient Medications:  .  allopurinol (ZYLOPRIM) 100 MG tablet, Take 100 mg by mouth daily., Disp: , Rfl:  .  amiodarone (PACERONE) 200 MG tablet, Take 1 tablet (200 mg total) by mouth daily., Disp: 30 tablet, Rfl: 3 .  aspirin 81 MG chewable tablet, Chew 81 mg by mouth daily., Disp: , Rfl:  .  atorvastatin (LIPITOR) 40 MG tablet, Take 40 mg by mouth daily. , Disp: , Rfl:  .  Calcium Carbonate-Vitamin D3 (CALCIUM 600/VITAMIN D) 600-400 MG-UNIT TABS, Take 1 tablet by mouth 2 (two) times daily. , Disp: , Rfl:  .  carvedilol (COREG) 25 MG tablet, Take 12.5 mg by mouth 2 (two) times daily with a meal., Disp: , Rfl:  .  diclofenac sodium (VOLTAREN) 1 % GEL, Apply 2 g topically 4 (four) times daily., Disp: 100 g, Rfl: 0 .  ferrous sulfate 325 (65 FE) MG tablet, Take 325 mg by mouth 3 (three) times daily. , Disp: , Rfl:  .  hydrALAZINE (APRESOLINE) 50 MG tablet, Take 1.5 tablets (75 mg total) by mouth 3 (three) times daily., Disp: 405 tablet, Rfl: 3 .  isosorbide mononitrate (IMDUR) 60 MG 24 hr tablet, Take 1 tablet (60 mg total) by mouth daily., Disp: 30 tablet, Rfl: 0 .  loratadine (CLARITIN) 10 MG tablet, Take 10 mg by mouth daily., Disp: , Rfl:  .  Magnesium Oxide 420 MG TABS, Take 840 mg by mouth daily., Disp: ,  Rfl:  .  Multiple Vitamin (MULTIVITAMIN) tablet, Take 1 tablet by mouth daily., Disp: , Rfl:  .  omeprazole (PRILOSEC) 40 MG capsule, Take 40 mg by mouth daily., Disp: , Rfl:  .  potassium chloride (KLOR-CON) 10 MEQ tablet, Take 4 tablets (40 mEq total) by mouth daily., Disp: 240 tablet, Rfl: 11 .  rOPINIRole (REQUIP) 0.5 MG tablet, Take 0.5 mg by mouth at bedtime. , Disp: , Rfl:  .  sertraline (ZOLOFT) 100  MG tablet, Take 150 mg by mouth daily., Disp: , Rfl:  .  torsemide (DEMADEX) 20 MG tablet, Take 1 tablet (20 mg total) by mouth 2 (two) times daily., Disp: 120 tablet, Rfl: 3 .  Calcium Carbonate-Vitamin D 600-400 MG-UNIT tablet, Take by mouth., Disp: , Rfl:  .  colchicine 0.6 MG tablet, Take 2 tablets now and 1 an hour later, Disp: , Rfl:  .  glipiZIDE (GLUCOTROL) 5 MG tablet, Take 2.5 mg by mouth daily before breakfast., Disp: , Rfl:  .  Investigational - Study Medication, Take 1 tablet by mouth 2 (two) times daily. Study name: Galactic Heart Failure Study Additional study details: Omecamtiv Mecarbil or Placebo (Patient not taking: Reported on 11/11/2019), Disp: 1 each, Rfl: PRN Allergies  Allergen Reactions  . Codeine Itching  . Gabapentin Itching  . Haldol [Haloperidol Lactate] Other (See Comments)    Dizziness, hallucinations  . Levofloxacin     Other reaction(s): Other (See Comments) Projectile vomiting   . Lisinopril Itching and Swelling  . Losartan Itching  . Morphine And Related Nausea And Vomiting  . Penicillins Nausea And Vomiting    "Vomiting, upset stomach, Headache" Has patient had a PCN reaction causing immediate rash, facial/tongue/throat swelling, SOB or lightheadedness with hypotension: No Has patient had a PCN reaction causing severe rash involving mucus membranes or skin necrosis: No Has patient had a PCN reaction that required hospitalization: No Has patient had a PCN reaction occurring within the last 10 years: No If all of the above answers are "NO", then may proceed with Cephalosporin use.       Social History   Socioeconomic History  . Marital status: Single    Spouse name: Not on file  . Number of children: Not on file  . Years of education: Not on file  . Highest education level: Not on file  Occupational History  . Not on file  Tobacco Use  . Smoking status: Never Smoker  . Smokeless tobacco: Never Used  Substance and Sexual Activity  . Alcohol  use: No  . Drug use: No  . Sexual activity: Not on file  Other Topics Concern  . Not on file  Social History Narrative  . Not on file   Social Determinants of Health   Financial Resource Strain:   . Difficulty of Paying Living Expenses: Not on file  Food Insecurity:   . Worried About Charity fundraiser in the Last Year: Not on file  . Ran Out of Food in the Last Year: Not on file  Transportation Needs:   . Lack of Transportation (Medical): Not on file  . Lack of Transportation (Non-Medical): Not on file  Physical Activity:   . Days of Exercise per Week: Not on file  . Minutes of Exercise per Session: Not on file  Stress:   . Feeling of Stress : Not on file  Social Connections:   . Frequency of Communication with Friends and Family: Not on file  . Frequency of Social Gatherings with Friends  and Family: Not on file  . Attends Religious Services: Not on file  . Active Member of Clubs or Organizations: Not on file  . Attends Archivist Meetings: Not on file  . Marital Status: Not on file  Intimate Partner Violence:   . Fear of Current or Ex-Partner: Not on file  . Emotionally Abused: Not on file  . Physically Abused: Not on file  . Sexually Abused: Not on file    Physical Exam Cardiovascular:     Rate and Rhythm: Normal rate and regular rhythm.     Pulses: Normal pulses.  Pulmonary:     Effort: Pulmonary effort is normal.     Breath sounds: Normal breath sounds.  Abdominal:     General: There is no distension.  Musculoskeletal:        General: Normal range of motion.     Right lower leg: No edema.     Left lower leg: No edema.  Skin:    General: Skin is warm and dry.     Capillary Refill: Capillary refill takes less than 2 seconds.  Neurological:     Mental Status: She is alert and oriented to person, place, and time.  Psychiatric:        Mood and Affect: Mood normal.         No future appointments.  BP 138/70 (BP Location: Left Arm, Patient  Position: Sitting, Cuff Size: Normal)   Pulse 78   Resp 16   Wt 180 lb (81.6 kg)   SpO2 96%   BMI 31.89 kg/m   Weight yesterday- did not weigh Last visit weight- 182 lb  Michelle Andrade was seen at home today and reported feeling generally well. She denied chest pain, increased SOB, headache, dizziness, orthopnea, fever or cough since our last visit. She stated she has been compliant with her medications over the past two weeks and her weigh has been stable. She was recently seen and had labs at Dr [employer] office and was noted to have elevated liver enzymes. She was told this could be a result of chronic amiodarone use but was instructed to continue the medication until she sees her PCP. Her Her medications were verified and her pillboxes were refilled she ran out of allopurinol and it is needed in the "New" box Wednesday-Saturday. I relayed this information and she was understanding and agreeable.   [name], EMT 11/11/19  ACTION: Home visit completed Next visit planned for 1 week

## 2019-11-16 ENCOUNTER — Telehealth (HOSPITAL_COMMUNITY): Payer: Self-pay | Admitting: Licensed Clinical Social Worker

## 2019-11-16 NOTE — Telephone Encounter (Signed)
CSW called pt to see if they received or been scheduled to receive the COVID-19 vaccine at this time.  Unable to reach- left message requesting return call to discuss and provide resources to sign up.  Arriana Lohmann H. Xiana Carns, LCSW Clinical Social Worker [facility] Desk#: [phone] Cell#: [phone]  

## 2019-11-16 NOTE — Telephone Encounter (Signed)
Pt returned CSW call to inform that she has already received her first dose of the COVID-19 vaccine and has her 2nd dose scheduled.  CSW will continue to follow and assist as needed  Jorge Ny, [facility] Desk#: [phone] Cell#: [phone]

## 2019-11-24 ENCOUNTER — Telehealth (HOSPITAL_COMMUNITY): Payer: Self-pay

## 2019-11-24 NOTE — Telephone Encounter (Signed)
I received a call from Ms Gensel this morning asking what time I would be coming to see her tomorrow since she has her vaccination appointment at 14:00. I advised I could be available at 09:00 and she agreed to meet me then.   [name], EMT 11/24/19

## 2019-11-25 ENCOUNTER — Other Ambulatory Visit (HOSPITAL_COMMUNITY): Payer: Self-pay

## 2019-11-25 NOTE — Progress Notes (Signed)
Paramedicine Encounter    Patient ID: Michelle Andrade, female    DOB: 06/15/1961, 59 y.o.   MRN: [id number]   Patient Care Team: Henderson Baltimore, [name] as PCP - General (Nurse Practitioner) Jorge Ny, LCSW as Social Worker (Licensed Clinical Social Worker)  Patient Active Problem List   Diagnosis Date Noted  . Secondary hyperparathyroidism of renal origin ([place]) 03/20/2018  . Severe obesity (BMI 35.0-39.9) with comorbidity ([place]) 12/19/2017  . CKD (chronic kidney disease) stage 4, GFR 15-29 ml/min (HCC) 06/07/2017  . Acute on chronic systolic (congestive) heart failure ([place])   . CHF (congestive heart failure) ([place]) 02/09/2017  . Pulmonary vascular congestion 02/09/2017  . Hypertension   . Sleep apnea   . Chondrocalcinosis of both knees 02/08/2017  . Primary osteoarthritis of both knees 02/08/2017  . Type 2 diabetes mellitus ([place]) 11/16/2016  . Borderline personality disorder ([place]) 10/17/2016  . Carpal tunnel syndrome 10/17/2016  . 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 Eczema 07/03/2016  . Atrial tachycardia ([place]) 05/31/2016  . 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 Onychomycosis of toenail 06/08/2015  . Cardiomyopathy ([place]) 06/07/2015  . Irritable bowel syndrome 09/27/2014  . PAD (peripheral artery disease) ([place]) 01/12/2014  . History of DVT (deep vein thrombosis) 01/12/2014  . Acute gout of right knee 12/29/2013  . Iron deficiency anemia 12/29/2013  . Lichenification and lichen simplex chronicus 12/26/2013  . Hyperlipidemia associated with type 2 diabetes mellitus ([place]) 11/10/2013  . Benign hypertension with chronic kidney disease, stage IV ([employer]) 11/10/2013  . Disorder of magnesium metabolism 10/13/2013  . CAD in native artery 09/01/2013  . Anxiety state 07/27/2013  . Depressive disorder 07/27/2013  . Gastro-esophageal reflux disease without esophagitis 07/27/2013  . Low back  pain 06/03/2013  . Mitral valve disease 06/03/2013  . Pulmonary valve disorder 06/03/2013  . Tricuspid valve disorder 06/03/2013  . 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 Allergic rhinitis 02/07/2013  . 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 Pain in joint 02/07/2013    Current Outpatient Medications:  .  allopurinol (ZYLOPRIM) 100 MG tablet, Take 100 mg by mouth daily., Disp: , Rfl:  .  amiodarone (PACERONE) 200 MG tablet, Take 1 tablet (200 mg total) by mouth daily., Disp: 30 tablet, Rfl: 3 .  aspirin 81 MG chewable tablet, Chew 81 mg by mouth daily., Disp: , Rfl:  .  atorvastatin (LIPITOR) 40 MG tablet, Take 40 mg by mouth daily. , Disp: , Rfl:  .  Calcium Carbonate-Vitamin D3 (CALCIUM 600/VITAMIN D) 600-400 MG-UNIT TABS, Take 1 tablet by mouth 2 (two) times daily. , Disp: , Rfl:  .  carvedilol (COREG) 25 MG tablet, Take 12.5 mg by mouth 2 (two) times daily with a meal., Disp: , Rfl:  .  diclofenac sodium (VOLTAREN) 1 % GEL, Apply 2 g topically 4 (four) times daily., Disp: 100 g, Rfl: 0 .  ferrous sulfate 325 (65 FE) MG tablet, Take 325 mg by mouth 3 (three) times daily. , Disp: , Rfl:  .  hydrALAZINE (APRESOLINE) 50 MG tablet, Take 1.5 tablets (75 mg total) by mouth 3 (three) times daily., Disp: 405 tablet, Rfl: 3 .  isosorbide mononitrate (IMDUR) 60 MG 24 hr tablet, Take 1 tablet (60 mg total) by mouth daily., Disp: 30 tablet, Rfl: 0 .  loratadine (CLARITIN) 10 MG tablet, Take 10 mg by mouth daily., Disp: , Rfl:  .  Magnesium Oxide 420 MG TABS, Take 840 mg by mouth daily., Disp: ,  Rfl:  .  Multiple Vitamin (MULTIVITAMIN) tablet, Take 1 tablet by mouth daily., Disp: , Rfl:  .  omeprazole (PRILOSEC) 40 MG capsule, Take 40 mg by mouth daily., Disp: , Rfl:  .  potassium chloride (KLOR-CON) 10 MEQ tablet, Take 4 tablets (40 mEq total) by mouth daily., Disp: 240 tablet, Rfl: 11 .  rOPINIRole (REQUIP) 0.5 MG tablet, Take 0.5 mg by mouth at bedtime. , Disp: , Rfl:  .  sertraline (ZOLOFT) 100  MG tablet, Take 150 mg by mouth daily., Disp: , Rfl:  .  torsemide (DEMADEX) 20 MG tablet, Take 1 tablet (20 mg total) by mouth 2 (two) times daily., Disp: 120 tablet, Rfl: 3 .  Calcium Carbonate-Vitamin D 600-400 MG-UNIT tablet, Take by mouth., Disp: , Rfl:  .  colchicine 0.6 MG tablet, Take 2 tablets now and 1 an hour later, Disp: , Rfl:  .  glipiZIDE (GLUCOTROL) 5 MG tablet, Take 2.5 mg by mouth daily before breakfast., Disp: , Rfl:  .  Investigational - Study Medication, Take 1 tablet by mouth 2 (two) times daily. Study name: [facility] Heart Failure Study Additional study details: Omecamtiv Mecarbil or Placebo (Patient not taking: Reported on 11/11/2019), Disp: 1 each, Rfl: PRN Allergies  Allergen Reactions  . Codeine Itching  . Gabapentin Itching  . Haldol [Haloperidol Lactate] Other (See Comments)    Dizziness, hallucinations  . Levofloxacin     Other reaction(s): Other (See Comments) Projectile vomiting   . Lisinopril Itching and Swelling  . Losartan Itching  . Morphine And Related Nausea And Vomiting  . Penicillins Nausea And Vomiting    "Vomiting, upset stomach, Headache" Has patient had a PCN reaction causing immediate rash, facial/tongue/throat swelling, SOB or lightheadedness with hypotension: No Has patient had a PCN reaction causing severe rash involving mucus membranes or skin necrosis: No Has patient had a PCN reaction that required hospitalization: No Has patient had a PCN reaction occurring within the last 10 years: No If all of the above answers are "NO", then may proceed with Cephalosporin use.       Social History   Socioeconomic History  . Marital status: Single    Spouse name: Not on file  . Number of children: Not on file  . Years of education: Not on file  . Highest education level: Not on file  Occupational History  . Not on file  Tobacco Use  . Smoking status: Never Smoker  . Smokeless tobacco: Never Used  Substance and Sexual Activity  . Alcohol  use: No  . Drug use: No  . Sexual activity: Not on file  Other Topics Concern  . Not on file  Social History Narrative  . Not on file   Social Determinants of Health   Financial Resource Strain:   . Difficulty of Paying Living Expenses:   Food Insecurity:   . Worried About Charity fundraiser in the Last Year:   . Arboriculturist in the Last Year:   Transportation Needs:   . Film/video editor (Medical):   [name] Lack of Transportation (Non-Medical):   Physical Activity:   . Days of Exercise per Week:   . Minutes of Exercise per Session:   Stress:   . Feeling of Stress :   Social Connections:   . Frequency of Communication with Friends and Family:   . Frequency of Social Gatherings with Friends and Family:   . Attends Religious Services:   . Active Member of Clubs or Organizations:   .  Attends Archivist Meetings:   [name] Marital Status:   Intimate Partner Violence:   . Fear of Current or Ex-Partner:   . Emotionally Abused:   [name] Physically Abused:   . Sexually Abused:     Physical Exam Cardiovascular:     Rate and Rhythm: Normal rate and regular rhythm.     Pulses: Normal pulses.  Pulmonary:     Effort: Pulmonary effort is normal.     Breath sounds: Normal breath sounds.  Musculoskeletal:        General: Normal range of motion.     Right lower leg: No edema.     Left lower leg: No edema.  Skin:    General: Skin is warm and dry.     Capillary Refill: Capillary refill takes less than 2 seconds.  Neurological:     Mental Status: Michelle Andrade is alert and oriented to person, place, and time.  Psychiatric:        Mood and Affect: Mood normal.         No future appointments.  BP 126/68 (BP Location: Left Arm, Patient Position: Sitting, Cuff Size: Normal)   Pulse 74   Resp 16   Wt 179 lb 6.4 oz (81.4 kg)   SpO2 96%   BMI 31.78 kg/m   Weight yesterday- did not weigh Last visit weight- 180 lb  Michelle Andrade was seen at home today and reported feeling well. Michelle Andrade  denied chest pain, SOB, headache, dizziness, orthopnea, fever or cough since our last visit. Michelle Andrade stated Michelle Andrade has been compliant with her medications over the past week and her weight has been stable. Her medications were verified and her pillbox was refilled I will follow up in two weeks.   [name], EMT 11/25/19  ACTION: Home visit completed Next visit planned for 2 weeks

## 2019-11-26 ENCOUNTER — Other Ambulatory Visit (HOSPITAL_COMMUNITY): Payer: Self-pay

## 2019-11-26 ENCOUNTER — Other Ambulatory Visit (HOSPITAL_COMMUNITY): Payer: Self-pay | Admitting: *Deleted

## 2019-11-26 MED ORDER — AMIODARONE HCL 200 MG PO TABS: 200.0000 mg | ORAL_TABLET | Freq: Every day | ORAL | 3 refills | Status: DC

## 2019-12-09 ENCOUNTER — Other Ambulatory Visit (HOSPITAL_COMMUNITY): Payer: Self-pay

## 2019-12-09 NOTE — Progress Notes (Signed)
Paramedicine Encounter    Patient ID: Michelle Andrade, female    DOB: 1961-01-10, 59 y.o.   MRN: [id number]   Patient Care Team: Henderson Baltimore, [name] as PCP - General (Nurse Practitioner) Jorge Ny, LCSW as Social Worker (Licensed Clinical Social Worker)  Patient Active Problem List   Diagnosis Date Noted  . Secondary hyperparathyroidism of renal origin ([name]) 03/20/2018  . Severe obesity (BMI 35.0-39.9) with comorbidity ([name]) 12/19/2017  . CKD (chronic kidney disease) stage 4, GFR 15-29 ml/min (HCC) 06/07/2017  . Acute on chronic systolic (congestive) heart failure ([name])   . CHF (congestive heart failure) ([name]) 02/09/2017  . Pulmonary vascular congestion 02/09/2017  . Hypertension   . Sleep apnea   . Chondrocalcinosis of both knees 02/08/2017  . Primary osteoarthritis of both knees 02/08/2017  . Type 2 diabetes mellitus ([name]) 11/16/2016  . Borderline personality disorder ([name]) 10/17/2016  . Carpal tunnel syndrome 10/17/2016  . 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 Eczema 07/03/2016  . Atrial tachycardia ([place]) 05/31/2016  . 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 Onychomycosis of toenail 06/08/2015  . Cardiomyopathy ([name]) 06/07/2015  . Irritable bowel syndrome 09/27/2014  . PAD (peripheral artery disease) ([name]) 01/12/2014  . History of DVT (deep vein thrombosis) 01/12/2014  . Acute gout of right knee 12/29/2013  . Iron deficiency anemia 12/29/2013  . Lichenification and lichen simplex chronicus 12/26/2013  . Hyperlipidemia associated with type 2 diabetes mellitus ([name]) 11/10/2013  . Benign hypertension with chronic kidney disease, stage IV ([name]) 11/10/2013  . Disorder of magnesium metabolism 10/13/2013  . CAD in native artery 09/01/2013  . Anxiety state 07/27/2013  . Depressive disorder 07/27/2013  . Gastro-esophageal reflux disease without esophagitis 07/27/2013  . Low back  pain 06/03/2013  . Mitral valve disease 06/03/2013  . Pulmonary valve disorder 06/03/2013  . Tricuspid valve disorder 06/03/2013  . 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 Allergic rhinitis 02/07/2013  . 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 Pain in joint 02/07/2013    Current Outpatient Medications:  .  allopurinol (ZYLOPRIM) 100 MG tablet, Take 100 mg by mouth daily., Disp: , Rfl:  .  amiodarone (PACERONE) 200 MG tablet, Take 1 tablet (200 mg total) by mouth daily., Disp: 30 tablet, Rfl: 3 .  aspirin 81 MG chewable tablet, Chew 81 mg by mouth daily., Disp: , Rfl:  .  atorvastatin (LIPITOR) 40 MG tablet, Take 40 mg by mouth daily. , Disp: , Rfl:  .  Calcium Carbonate-Vitamin D3 (CALCIUM 600/VITAMIN D) 600-400 MG-UNIT TABS, Take 1 tablet by mouth 2 (two) times daily. , Disp: , Rfl:  .  carvedilol (COREG) 25 MG tablet, Take 12.5 mg by mouth 2 (two) times daily with a meal., Disp: , Rfl:  .  colchicine 0.6 MG tablet, Take 2 tablets now and 1 an hour later, Disp: , Rfl:  .  diclofenac sodium (VOLTAREN) 1 % GEL, Apply 2 g topically 4 (four) times daily., Disp: 100 g, Rfl: 0 .  ferrous sulfate 325 (65 FE) MG tablet, Take 325 mg by mouth 3 (three) times daily. , Disp: , Rfl:  .  hydrALAZINE (APRESOLINE) 50 MG tablet, Take 1.5 tablets (75 mg total) by mouth 3 (three) times daily., Disp: 405 tablet, Rfl: 3 .  isosorbide mononitrate (IMDUR) 60 MG 24 hr tablet, Take 1 tablet (60 mg total) by mouth daily., Disp: 30 tablet, Rfl: 0 .  loratadine (CLARITIN) 10 MG tablet, Take 10 mg by mouth  daily., Disp: , Rfl:  .  Magnesium Oxide 420 MG TABS, Take 840 mg by mouth daily., Disp: , Rfl:  .  Multiple Vitamin (MULTIVITAMIN) tablet, Take 1 tablet by mouth daily., Disp: , Rfl:  .  omeprazole (PRILOSEC) 40 MG capsule, Take 40 mg by mouth daily., Disp: , Rfl:  .  potassium chloride (KLOR-CON) 10 MEQ tablet, Take 4 tablets (40 mEq total) by mouth daily., Disp: 240 tablet, Rfl: 11 .  rOPINIRole (REQUIP) 0.5 MG  tablet, Take 0.5 mg by mouth at bedtime. , Disp: , Rfl:  .  sertraline (ZOLOFT) 100 MG tablet, Take 150 mg by mouth daily., Disp: , Rfl:  .  torsemide (DEMADEX) 20 MG tablet, Take 1 tablet (20 mg total) by mouth 2 (two) times daily., Disp: 120 tablet, Rfl: 3 .  Calcium Carbonate-Vitamin D 600-400 MG-UNIT tablet, Take by mouth., Disp: , Rfl:  .  glipiZIDE (GLUCOTROL) 5 MG tablet, Take 2.5 mg by mouth daily before breakfast., Disp: , Rfl:  .  Investigational - Study Medication, Take 1 tablet by mouth 2 (two) times daily. Study name: [facility] Heart Failure Study Additional study details: Omecamtiv Mecarbil or Placebo (Patient not taking: Reported on 11/11/2019), Disp: 1 each, Rfl: PRN Allergies  Allergen Reactions  . Codeine Itching  . Gabapentin Itching  . Haldol [Haloperidol Lactate] Other (See Comments)    Dizziness, hallucinations  . Levofloxacin     Other reaction(s): Other (See Comments) Projectile vomiting   . Lisinopril Itching and Swelling  . Losartan Itching  . Morphine And Related Nausea And Vomiting  . Penicillins Nausea And Vomiting    "Vomiting, upset stomach, Headache" Has patient had a PCN reaction causing immediate rash, facial/tongue/throat swelling, SOB or lightheadedness with hypotension: No Has patient had a PCN reaction causing severe rash involving mucus membranes or skin necrosis: No Has patient had a PCN reaction that required hospitalization: No Has patient had a PCN reaction occurring within the last 10 years: No If all of the above answers are "NO", then may proceed with Cephalosporin use.       Social History   Socioeconomic History  . Marital status: Single    Spouse name: Not on file  . Number of children: Not on file  . Years of education: Not on file  . Highest education level: Not on file  Occupational History  . Not on file  Tobacco Use  . Smoking status: Never Smoker  . Smokeless tobacco: Never Used  Substance and Sexual Activity  . Alcohol  use: No  . Drug use: No  . Sexual activity: Not on file  Other Topics Concern  . Not on file  Social History Narrative  . Not on file   Social Determinants of Health   Financial Resource Strain:   . Difficulty of Paying Living Expenses:   Food Insecurity:   . Worried About Charity fundraiser in the Last Year:   . Arboriculturist in the Last Year:   Transportation Needs:   . Film/video editor (Medical):   [name] Lack of Transportation (Non-Medical):   Physical Activity:   . Days of Exercise per Week:   . Minutes of Exercise per Session:   Stress:   . Feeling of Stress :   Social Connections:   . Frequency of Communication with Friends and Family:   . Frequency of Social Gatherings with Friends and Family:   . Attends Religious Services:   . Active Member of Clubs or Organizations:   .  Attends Archivist Meetings:   [name] Marital Status:   Intimate Partner Violence:   . Fear of Current or Ex-Partner:   . Emotionally Abused:   [name] Physically Abused:   . Sexually Abused:     Physical Exam Cardiovascular:     Rate and Rhythm: Normal rate and regular rhythm.     Pulses: Normal pulses.  Pulmonary:     Effort: Pulmonary effort is normal.     Breath sounds: Normal breath sounds.  Abdominal:     General: There is no distension.  Musculoskeletal:        General: Normal range of motion.     Right lower leg: No edema.     Left lower leg: No edema.  Skin:    General: Skin is warm and dry.     Capillary Refill: Capillary refill takes less than 2 seconds.  Neurological:     Mental Status: She is alert and oriented to person, place, and time.  Psychiatric:        Mood and Affect: Mood normal.         No future appointments.  BP 110/83 (BP Location: Left Arm, Patient Position: Sitting, Cuff Size: Normal)   Pulse 74   Resp 16   Wt 177 lb (80.3 kg)   SpO2 98%   BMI 31.35 kg/m   Weight yesterday- did not weigh Last visit weight- 179 lb  Ms Cowles was seen  at home today and reported feeling well. She denied chest pain, SOB, headache, dizziness, orthopnea, fever or cough since our last visit. She stated she has been compliant with her medications over the past two weeks and her weight has been stable. Her medications were verified and her pillbox was refilled. I will follow up next week.   [name], EMT 12/09/19  ACTION: Home visit completed Next visit planned for 2 week

## 2019-12-23 ENCOUNTER — Other Ambulatory Visit (HOSPITAL_COMMUNITY): Payer: Self-pay

## 2019-12-23 NOTE — Progress Notes (Signed)
Paramedicine Encounter    Patient ID: Michelle Andrade, female    DOB: 1961/05/20, 59 y.o.   MRN: [id number]   Patient Care Team: Henderson Baltimore, [name] as PCP - General (Nurse Practitioner) Jorge Ny, LCSW as Social Worker (Licensed Clinical Social Worker)  Patient Active Problem List   Diagnosis Date Noted  . Secondary hyperparathyroidism of renal origin ([place]) 03/20/2018  . Severe obesity (BMI 35.0-39.9) with comorbidity ([place]) 12/19/2017  . CKD (chronic kidney disease) stage 4, GFR 15-29 ml/min (HCC) 06/07/2017  . Acute on chronic systolic (congestive) heart failure ([place])   . CHF (congestive heart failure) ([place]) 02/09/2017  . Pulmonary vascular congestion 02/09/2017  . Hypertension   . Sleep apnea   . Chondrocalcinosis of both knees 02/08/2017  . Primary osteoarthritis of both knees 02/08/2017  . Type 2 diabetes mellitus ([place]) 11/16/2016  . Borderline personality disorder ([place]) 10/17/2016  . Carpal tunnel syndrome 10/17/2016  . 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 Eczema 07/03/2016  . Atrial tachycardia ([place]) 05/31/2016  . 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 Onychomycosis of toenail 06/08/2015  . Cardiomyopathy ([place]) 06/07/2015  . Irritable bowel syndrome 09/27/2014  . PAD (peripheral artery disease) ([place]) 01/12/2014  . History of DVT (deep vein thrombosis) 01/12/2014  . Acute gout of right knee 12/29/2013  . Iron deficiency anemia 12/29/2013  . Lichenification and lichen simplex chronicus 12/26/2013  . Hyperlipidemia associated with type 2 diabetes mellitus ([place]) 11/10/2013  . Benign hypertension with chronic kidney disease, stage IV ([place]) 11/10/2013  . Disorder of magnesium metabolism 10/13/2013  . CAD in native artery 09/01/2013  . Anxiety state 07/27/2013  . Depressive disorder 07/27/2013  . Gastro-esophageal reflux disease without esophagitis 07/27/2013  . Low back  pain 06/03/2013  . Mitral valve disease 06/03/2013  . Pulmonary valve disorder 06/03/2013  . Tricuspid valve disorder 06/03/2013  . 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 Allergic rhinitis 02/07/2013  . 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 Pain in joint 02/07/2013    Current Outpatient Medications:  .  allopurinol (ZYLOPRIM) 100 MG tablet, Take 100 mg by mouth daily., Disp: , Rfl:  .  amiodarone (PACERONE) 200 MG tablet, Take 1 tablet (200 mg total) by mouth daily., Disp: 30 tablet, Rfl: 3 .  aspirin 81 MG chewable tablet, Chew 81 mg by mouth daily., Disp: , Rfl:  .  atorvastatin (LIPITOR) 40 MG tablet, Take 40 mg by mouth daily. , Disp: , Rfl:  .  Calcium Carbonate-Vitamin D3 (CALCIUM 600/VITAMIN D) 600-400 MG-UNIT TABS, Take 1 tablet by mouth 2 (two) times daily. , Disp: , Rfl:  .  carvedilol (COREG) 25 MG tablet, Take 12.5 mg by mouth 2 (two) times daily with a meal., Disp: , Rfl:  .  colchicine 0.6 MG tablet, Take 2 tablets now and 1 an hour later, Disp: , Rfl:  .  diclofenac sodium (VOLTAREN) 1 % GEL, Apply 2 g topically 4 (four) times daily., Disp: 100 g, Rfl: 0 .  ferrous sulfate 325 (65 FE) MG tablet, Take 325 mg by mouth 3 (three) times daily. , Disp: , Rfl:  .  hydrALAZINE (APRESOLINE) 50 MG tablet, Take 1.5 tablets (75 mg total) by mouth 3 (three) times daily., Disp: 405 tablet, Rfl: 3 .  isosorbide mononitrate (IMDUR) 60 MG 24 hr tablet, Take 1 tablet (60 mg total) by mouth daily., Disp: 30 tablet, Rfl: 0 .  loratadine (CLARITIN) 10 MG tablet, Take 10 mg by mouth  daily., Disp: , Rfl:  .  Magnesium Oxide 420 MG TABS, Take 840 mg by mouth daily., Disp: , Rfl:  .  Multiple Vitamin (MULTIVITAMIN) tablet, Take 1 tablet by mouth daily., Disp: , Rfl:  .  omeprazole (PRILOSEC) 40 MG capsule, Take 40 mg by mouth daily., Disp: , Rfl:  .  potassium chloride (KLOR-CON) 10 MEQ tablet, Take 4 tablets (40 mEq total) by mouth daily., Disp: 240 tablet, Rfl: 11 .  rOPINIRole (REQUIP) 0.5 MG  tablet, Take 0.5 mg by mouth at bedtime. , Disp: , Rfl:  .  sertraline (ZOLOFT) 100 MG tablet, Take 150 mg by mouth daily., Disp: , Rfl:  .  torsemide (DEMADEX) 20 MG tablet, Take 1 tablet (20 mg total) by mouth 2 (two) times daily., Disp: 120 tablet, Rfl: 3 .  Calcium Carbonate-Vitamin D 600-400 MG-UNIT tablet, Take by mouth., Disp: , Rfl:  .  glipiZIDE (GLUCOTROL) 5 MG tablet, Take 2.5 mg by mouth daily before breakfast., Disp: , Rfl:  .  Investigational - Study Medication, Take 1 tablet by mouth 2 (two) times daily. Study name: Galactic Heart Failure Study Additional study details: Omecamtiv Mecarbil or Placebo (Patient not taking: Reported on 11/11/2019), Disp: 1 each, Rfl: PRN Allergies  Allergen Reactions  . Codeine Itching  . Gabapentin Itching  . Haldol [Haloperidol Lactate] Other (See Comments)    Dizziness, hallucinations  . Levofloxacin     Other reaction(s): Other (See Comments) Projectile vomiting   . Lisinopril Itching and Swelling  . Losartan Itching  . Morphine And Related Nausea And Vomiting  . Penicillins Nausea And Vomiting    "Vomiting, upset stomach, Headache" Has patient had a PCN reaction causing immediate rash, facial/tongue/throat swelling, SOB or lightheadedness with hypotension: No Has patient had a PCN reaction causing severe rash involving mucus membranes or skin necrosis: No Has patient had a PCN reaction that required hospitalization: No Has patient had a PCN reaction occurring within the last 10 years: No If all of the above answers are "NO", then may proceed with Cephalosporin use.       Social History   Socioeconomic History  . Marital status: Single    Spouse name: Not on file  . Number of children: Not on file  . Years of education: Not on file  . Highest education level: Not on file  Occupational History  . Not on file  Tobacco Use  . Smoking status: Never Smoker  . Smokeless tobacco: Never Used  Substance and Sexual Activity  . Alcohol  use: No  . Drug use: No  . Sexual activity: Not on file  Other Topics Concern  . Not on file  Social History Narrative  . Not on file   Social Determinants of Health   Financial Resource Strain:   . Difficulty of Paying Living Expenses:   Food Insecurity:   . Worried About Charity fundraiser in the Last Year:   . Arboriculturist in the Last Year:   Transportation Needs:   . Film/video editor (Medical):   [name] Lack of Transportation (Non-Medical):   Physical Activity:   . Days of Exercise per Week:   . Minutes of Exercise per Session:   Stress:   . Feeling of Stress :   Social Connections:   . Frequency of Communication with Friends and Family:   . Frequency of Social Gatherings with Friends and Family:   . Attends Religious Services:   . Active Member of Clubs or Organizations:   .  Attends Archivist Meetings:   [name] Marital Status:   Intimate Partner Violence:   . Fear of Current or Ex-Partner:   . Emotionally Abused:   [name] Physically Abused:   . Sexually Abused:     Physical Exam Cardiovascular:     Rate and Rhythm: Normal rate and regular rhythm.     Pulses: Normal pulses.  Pulmonary:     Effort: Pulmonary effort is normal.     Breath sounds: Normal breath sounds.  Musculoskeletal:        General: Normal range of motion.     Right lower leg: No edema.     Left lower leg: No edema.  Skin:    General: Skin is warm and dry.     Capillary Refill: Capillary refill takes less than 2 seconds.  Neurological:     Mental Status: She is alert and oriented to person, place, and time.  Psychiatric:        Mood and Affect: Mood normal.         No future appointments.  BP 118/83 (BP Location: Left Arm, Patient Position: Sitting, Cuff Size: Normal)   Pulse 80   Resp 18   Wt 178 lb (80.7 kg)   SpO2 95%   BMI 31.53 kg/m   Weight yesterday- did not weigh Last visit weight- 177 lb  Michelle Andrade was seen at home today and reported feeling well. She denied  chest pain, SOB, headache, dizziness, orthopnea, fever or cough over the past week. She stated she has been compliant with her medications and her weight has been stable. Her medications were verified and her pillbox was refilled. I will follow up next week.   Michelle Andrade, EMT 12/23/19  ACTION: Home visit completed Next visit planned for 2 weeks

## 2020-01-05 ENCOUNTER — Telehealth (HOSPITAL_COMMUNITY): Payer: Self-pay

## 2020-01-05 ENCOUNTER — Other Ambulatory Visit: Payer: Self-pay

## 2020-01-05 ENCOUNTER — Encounter (HOSPITAL_BASED_OUTPATIENT_CLINIC_OR_DEPARTMENT_OTHER): Payer: Self-pay | Admitting: *Deleted

## 2020-01-05 ENCOUNTER — Inpatient Hospital Stay (HOSPITAL_BASED_OUTPATIENT_CLINIC_OR_DEPARTMENT_OTHER)
Admission: EM | Admit: 2020-01-05 | Discharge: 2020-01-09 | DRG: 287 | Disposition: A | Discharge status: Home / Self Care | Payer: Medicare Other | Attending: Internal Medicine | Admitting: Internal Medicine

## 2020-01-05 ENCOUNTER — Emergency Department (HOSPITAL_BASED_OUTPATIENT_CLINIC_OR_DEPARTMENT_OTHER): Payer: Medicare Other

## 2020-01-05 ENCOUNTER — Telehealth: Payer: Self-pay

## 2020-01-05 DIAGNOSIS — I2510 Atherosclerotic heart disease of native coronary artery without angina pectoris: Secondary | ICD-10-CM | POA: Diagnosis present

## 2020-01-05 DIAGNOSIS — Z90710 Acquired absence of both cervix and uterus: Secondary | ICD-10-CM | POA: Diagnosis not present

## 2020-01-05 DIAGNOSIS — Z95810 Presence of automatic (implantable) cardiac defibrillator: Secondary | ICD-10-CM | POA: Diagnosis not present

## 2020-01-05 DIAGNOSIS — E1122 Type 2 diabetes mellitus with diabetic chronic kidney disease: Secondary | ICD-10-CM | POA: Diagnosis present

## 2020-01-05 DIAGNOSIS — R0602 Shortness of breath: Secondary | ICD-10-CM | POA: Diagnosis present

## 2020-01-05 DIAGNOSIS — I5022 Chronic systolic (congestive) heart failure: Secondary | ICD-10-CM | POA: Diagnosis not present

## 2020-01-05 DIAGNOSIS — Z20822 Contact with and (suspected) exposure to covid-19: Secondary | ICD-10-CM | POA: Diagnosis present

## 2020-01-05 DIAGNOSIS — Z881 Allergy status to other antibiotic agents status: Secondary | ICD-10-CM

## 2020-01-05 DIAGNOSIS — G4733 Obstructive sleep apnea (adult) (pediatric): Secondary | ICD-10-CM | POA: Diagnosis not present

## 2020-01-05 DIAGNOSIS — E669 Obesity, unspecified: Secondary | ICD-10-CM | POA: Diagnosis not present

## 2020-01-05 DIAGNOSIS — Z6831 Body mass index (BMI) 31.0-31.9, adult: Secondary | ICD-10-CM | POA: Diagnosis not present

## 2020-01-05 DIAGNOSIS — Z888 Allergy status to other drugs, medicaments and biological substances status: Secondary | ICD-10-CM

## 2020-01-05 DIAGNOSIS — E7800 Pure hypercholesterolemia, unspecified: Secondary | ICD-10-CM | POA: Diagnosis present

## 2020-01-05 DIAGNOSIS — Z880 Allergy status to penicillin: Secondary | ICD-10-CM

## 2020-01-05 DIAGNOSIS — E1151 Type 2 diabetes mellitus with diabetic peripheral angiopathy without gangrene: Secondary | ICD-10-CM | POA: Diagnosis not present

## 2020-01-05 DIAGNOSIS — K589 Irritable bowel syndrome without diarrhea: Secondary | ICD-10-CM | POA: Diagnosis not present

## 2020-01-05 DIAGNOSIS — Z7982 Long term (current) use of aspirin: Secondary | ICD-10-CM

## 2020-01-05 DIAGNOSIS — I428 Other cardiomyopathies: Secondary | ICD-10-CM | POA: Diagnosis present

## 2020-01-05 DIAGNOSIS — I471 Supraventricular tachycardia: Secondary | ICD-10-CM | POA: Diagnosis not present

## 2020-01-05 DIAGNOSIS — R0789 Other chest pain: Principal | ICD-10-CM | POA: Diagnosis present

## 2020-01-05 DIAGNOSIS — N1832 Chronic kidney disease, stage 3b: Secondary | ICD-10-CM | POA: Diagnosis present

## 2020-01-05 DIAGNOSIS — R079 Chest pain, unspecified: Secondary | ICD-10-CM | POA: Diagnosis present

## 2020-01-05 DIAGNOSIS — I252 Old myocardial infarction: Secondary | ICD-10-CM

## 2020-01-05 DIAGNOSIS — Z8249 Family history of ischemic heart disease and other diseases of the circulatory system: Secondary | ICD-10-CM

## 2020-01-05 DIAGNOSIS — F329 Major depressive disorder, single episode, unspecified: Secondary | ICD-10-CM | POA: Diagnosis present

## 2020-01-05 DIAGNOSIS — Z885 Allergy status to narcotic agent status: Secondary | ICD-10-CM

## 2020-01-05 DIAGNOSIS — R9439 Abnormal result of other cardiovascular function study: Secondary | ICD-10-CM | POA: Diagnosis not present

## 2020-01-05 DIAGNOSIS — M109 Gout, unspecified: Secondary | ICD-10-CM | POA: Diagnosis present

## 2020-01-05 DIAGNOSIS — I130 Hypertensive heart and chronic kidney disease with heart failure and stage 1 through stage 4 chronic kidney disease, or unspecified chronic kidney disease: Secondary | ICD-10-CM | POA: Diagnosis not present

## 2020-01-05 DIAGNOSIS — K219 Gastro-esophageal reflux disease without esophagitis: Secondary | ICD-10-CM | POA: Diagnosis not present

## 2020-01-05 DIAGNOSIS — F39 Unspecified mood [affective] disorder: Secondary | ICD-10-CM | POA: Diagnosis present

## 2020-01-05 DIAGNOSIS — Z79899 Other long term (current) drug therapy: Secondary | ICD-10-CM

## 2020-01-05 DIAGNOSIS — E785 Hyperlipidemia, unspecified: Secondary | ICD-10-CM | POA: Diagnosis present

## 2020-01-05 LAB — CBC WITH DIFFERENTIAL/PLATELET
Abs Immature Granulocytes: 0.02 10*3/uL (ref 0.00–0.07)
Basophils Absolute: 0 10*3/uL (ref 0.0–0.1)
Basophils Relative: 1 %
Eosinophils Absolute: 0.1 10*3/uL (ref 0.0–0.5)
Eosinophils Relative: 2 %
HCT: 41 % (ref 36.0–46.0)
Hemoglobin: 13.6 g/dL (ref 12.0–15.0)
Immature Granulocytes: 0 %
Lymphocytes Relative: 29 %
Lymphs Abs: 2 10*3/uL (ref 0.7–4.0)
MCH: 30.6 pg (ref 26.0–34.0)
MCHC: 33.2 g/dL (ref 30.0–36.0)
MCV: 92.1 fL (ref 80.0–100.0)
Monocytes Absolute: 0.6 10*3/uL (ref 0.1–1.0)
Monocytes Relative: 9 %
Neutro Abs: 4.1 10*3/uL (ref 1.7–7.7)
Neutrophils Relative %: 59 %
Platelets: 255 10*3/uL (ref 150–400)
RBC: 4.45 MIL/uL (ref 3.87–5.11)
RDW: 16.8 % — ABNORMAL HIGH (ref 11.5–15.5)
WBC: 6.9 10*3/uL (ref 4.0–10.5)
nRBC: 0 % (ref 0.0–0.2)

## 2020-01-05 LAB — HEPATIC FUNCTION PANEL
ALT: 70 U/L — ABNORMAL HIGH (ref 0–44)
AST: 69 U/L — ABNORMAL HIGH (ref 15–41)
Albumin: 3.5 g/dL (ref 3.5–5.0)
Alkaline Phosphatase: 128 U/L — ABNORMAL HIGH (ref 38–126)
Bilirubin, Direct: 0.1 mg/dL (ref 0.0–0.2)
Indirect Bilirubin: 0.3 mg/dL (ref 0.3–0.9)
Total Bilirubin: 0.4 mg/dL (ref 0.3–1.2)
Total Protein: 7.7 g/dL (ref 6.5–8.1)

## 2020-01-05 LAB — TROPONIN I (HIGH SENSITIVITY)
Troponin I (High Sensitivity): 33 ng/L — ABNORMAL HIGH (ref <18)
Troponin I (High Sensitivity): 34 ng/L — ABNORMAL HIGH (ref <18)

## 2020-01-05 LAB — BASIC METABOLIC PANEL
Anion gap: 12 (ref 5–15)
BUN: 16 mg/dL (ref 6–20)
CO2: 24 mmol/L (ref 22–32)
Calcium: 9 mg/dL (ref 8.9–10.3)
Chloride: 100 mmol/L (ref 98–111)
Creatinine, Ser: 1.52 mg/dL — ABNORMAL HIGH (ref 0.44–1.00)
GFR calc Af Amer: 43 mL/min — ABNORMAL LOW (ref >60)
GFR calc non Af Amer: 37 mL/min — ABNORMAL LOW (ref >60)
Glucose, Bld: 199 mg/dL — ABNORMAL HIGH (ref 70–99)
Potassium: 3.8 mmol/L (ref 3.5–5.1)
Sodium: 136 mmol/L (ref 135–145)

## 2020-01-05 LAB — BRAIN NATRIURETIC PEPTIDE: B Natriuretic Peptide: 113.7 pg/mL — ABNORMAL HIGH (ref 0.0–100.0)

## 2020-01-05 NOTE — ED Provider Notes (Signed)
[facility] EMERGENCY DEPARTMENT Provider Note   CSN: [id number] Arrival date & time: 01/05/20  1643     History Chief Complaint  Patient presents with  . Shortness of Breath    Maghen Group is a 59 y.o. female.  HPI      Very pleasant 59 year old female with a history of coronary artery disease, CHF with an ejection fraction 25 to 30%, ICD in place, diabetes, hypertension, hyperlipidemia, peripheral vascular disease, CKD, presents with concern for chest pain, palpitations, and nausea.  Monday had some nausea, threw up once, then began to have some chest pain Last night nausea, felt short of breath Felt heart palpitations Chest pain, under left breast had twinge of chest pain, like a sharp pain. Has had them before but this time has been a few days of them coming and going, lasts a few seconds, several times an hour Coming more frequently, left arm with some tingling, has a pinched nerve, thought it was the way she was sleeping on it Last night couldn't sleep  Just doesn't feel right, thinks it may be device Don't have much time left on the device Woke up with hiccups last night (has been having them for weeks) EF 25-30%, usually gets abdominal swelling with fluid, not leg swelling, no leg swelling now. Taking torsemide.  No fever, abdominal pain Emesis here, low appetite  Cough off and on, chronic unchanged    Past Medical History:  Diagnosis Date  . Arthritis 02/08/2017  . 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 CAD (coronary artery disease) 09/01/2013  . CHF (congestive heart failure) ([place]) 06/07/2015  . CKD (chronic kidney disease), stage III 12/09/2013  . Depression 07/27/2013  . Diabetes mellitus without complication ([place]) 19/14/7829  . GERD (gastroesophageal reflux disease) 07/27/2013  . Gout 12/29/2013  . Heart valve disorder 06/03/2013  . High cholesterol 11/10/2013  . Hypertension 11/10/2013  . IBS  (irritable bowel syndrome) 09/27/2014  . Myocardial infarct ([place])   . Sleep apnea 10/13/2013  . Vascular disorder 01/12/2014    Patient Active Problem List   Diagnosis Date Noted  . Chest pain 01/05/2020  . Secondary hyperparathyroidism of renal origin ([place]) 03/20/2018  . Severe obesity (BMI 35.0-39.9) with comorbidity ([place]) 12/19/2017  . CKD (chronic kidney disease) stage 4, GFR 15-29 ml/min (HCC) 06/07/2017  . Acute on chronic systolic (congestive) heart failure ([place])   . CHF (congestive heart failure) ([place]) 02/09/2017  . Pulmonary vascular congestion 02/09/2017  . Hypertension   . Sleep apnea   . Chondrocalcinosis of both knees 02/08/2017  . Primary osteoarthritis of both knees 02/08/2017  . Type 2 diabetes mellitus ([place]) 11/16/2016  . Borderline personality disorder ([place]) 10/17/2016  . Carpal tunnel syndrome 10/17/2016  . 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 Eczema 07/03/2016  . Atrial tachycardia ([place]) 05/31/2016  . 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 Onychomycosis of toenail 06/08/2015  . Cardiomyopathy ([place]) 06/07/2015  . Irritable bowel syndrome 09/27/2014  . PAD (peripheral artery disease) ([place]) 01/12/2014  . History of DVT (deep vein thrombosis) 01/12/2014  . Acute gout of right knee 12/29/2013  . Iron deficiency anemia 12/29/2013  . Lichenification and lichen simplex chronicus 12/26/2013  . Hyperlipidemia associated with type 2 diabetes mellitus ([place]) 11/10/2013  . Benign hypertension with chronic kidney disease, stage IV ([place]) 11/10/2013  . Disorder of magnesium metabolism 10/13/2013  . CAD in native artery 09/01/2013  . Anxiety state 07/27/2013  .  Depressive disorder 07/27/2013  . Gastro-esophageal reflux disease without esophagitis 07/27/2013  . Low back pain 06/03/2013  . Mitral valve disease 06/03/2013  . Pulmonary valve disorder 06/03/2013  . Tricuspid valve  disorder 06/03/2013  . 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 Allergic rhinitis 02/07/2013  . 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 Pain in joint 02/07/2013    Past Surgical History:  Procedure Laterality Date  . ABDOMINAL HYSTERECTOMY    . CARDIAC DEFIBRILLATOR PLACEMENT    . CARPAL TUNNEL RELEASE    . HEEL SPUR EXCISION    . KNEE SURGERY    . PACEMAKER GENERATOR CHANGE    . RIGHT HEART CATH N/A 02/13/2017   Procedure: Right Heart Cath;  Surgeon: Jolaine Artist, MD;  Location: [facility];  Service: Cardiovascular;  Laterality: N/A;  . SHOULDER SURGERY       OB History   No obstetric history on file.     Family History  Problem Relation Age of Onset  . Hypertension Mother     Social History   Tobacco Use  . Smoking status: Never Smoker  . Smokeless tobacco: Never Used  Substance Use Topics  . Alcohol use: No  . Drug use: No    Home Medications Prior to Admission medications   Medication Sig Start Date End Date Taking? Authorizing Provider  allopurinol (ZYLOPRIM) 100 MG tablet Take 100 mg by mouth daily.    [provider]  amiodarone (PACERONE) 200 MG tablet Take 1 tablet (200 mg total) by mouth daily. 11/26/19   Bensimhon, Shaune Pascal, MD  aspirin 81 MG chewable tablet Chew 81 mg by mouth daily.    [provider]  atorvastatin (LIPITOR) 40 MG tablet Take 40 mg by mouth daily.     [provider]  Calcium Carbonate-Vitamin D 600-400 MG-UNIT tablet Take by mouth. 07/14/12   [provider]  Calcium Carbonate-Vitamin D3 (CALCIUM 600/VITAMIN D) 600-400 MG-UNIT TABS Take 1 tablet by mouth 2 (two) times daily.     [provider]  carvedilol (COREG) 25 MG tablet Take 12.5 mg by mouth 2 (two) times daily with a meal.    [provider]  colchicine 0.6 MG tablet Take 2 tablets now and 1 an hour later 12/09/18   [provider]  diclofenac sodium (VOLTAREN) 1 % GEL Apply 2 g topically 4 (four) times  daily. 03/27/18   Caccavale, Sophia, PA-C  ferrous sulfate 325 (65 FE) MG tablet Take 325 mg by mouth 3 (three) times daily.     [provider]  glipiZIDE (GLUCOTROL) 5 MG tablet Take 2.5 mg by mouth daily before breakfast.    [provider]  hydrALAZINE (APRESOLINE) 50 MG tablet Take 1.5 tablets (75 mg total) by mouth 3 (three) times daily. 11/05/18   Bensimhon, Shaune Pascal, MD  Investigational - Study Medication Take 1 tablet by mouth 2 (two) times daily. Study name: Galactic Heart Failure Study Additional study details: Omecamtiv Mecarbil or Placebo Patient not taking: Reported on 11/11/2019 02/15/17   Larey Dresser, MD  isosorbide mononitrate (IMDUR) 60 MG 24 hr tablet Take 1 tablet (60 mg total) by mouth daily. 02/16/17   Mariel Aloe, MD  loratadine (CLARITIN) 10 MG tablet Take 10 mg by mouth daily.    [provider]  Magnesium Oxide 420 MG TABS Take 840 mg by mouth daily.    [provider]  Multiple Vitamin (MULTIVITAMIN) tablet Take 1 tablet by mouth daily.    [provider]  omeprazole (PRILOSEC) 40 MG capsule Take 40 mg by mouth daily.    [provider]  potassium chloride (KLOR-CON) 10 MEQ tablet Take 4 tablets (40 mEq total) by mouth daily. 07/15/19   Clegg, Amy D, NP  rOPINIRole (REQUIP) 0.5 MG tablet Take 0.5 mg by mouth at bedtime.     [provider]  sertraline (ZOLOFT) 100 MG tablet Take 150 mg by mouth daily.    [provider]  torsemide (DEMADEX) 20 MG tablet Take 1 tablet (20 mg total) by mouth 2 (two) times daily. 04/11/17   Shirley Friar, PA-C  TRULICITY 3.30 QT/6.2UQ SOPN Inject 0.75 mg into the skin once a week. 12/20/19   [provider]    Allergies    Codeine, Gabapentin, Haldol [haloperidol lactate], Levofloxacin, Lisinopril, Losartan, Morphine and related, and Penicillins  Review of Systems   Review of Systems  Constitutional: Positive for appetite change and fatigue.  Negative for fever. Diaphoresis: at night, hot flashes, not changed.  HENT: Negative for sore throat.   Eyes: Negative for visual disturbance.  Respiratory: Positive for shortness of breath. Negative for cough.   Cardiovascular: Positive for chest pain and palpitations. Negative for leg swelling.  Gastrointestinal: Positive for nausea and vomiting. Negative for abdominal pain and diarrhea.  Genitourinary: Negative for difficulty urinating.  Musculoskeletal: Negative for back pain and neck pain.  Skin: Negative for rash.  Neurological: Positive for light-headedness. Negative for syncope and headaches.    Physical Exam Updated Vital Signs BP 137/74 (BP Location: Right Arm)   Pulse 67   Temp 99.5 F (37.5 C) (Oral)   Resp (!) 32   Ht 5\' 3"  (1.6 m)   Wt 80.7 kg   SpO2 96%   BMI 31.52 kg/m   Physical Exam Vitals and nursing note reviewed.  Constitutional:      General: She is not in acute distress.    Appearance: She is well-developed. She is not diaphoretic.  HENT:     Head: Normocephalic and atraumatic.  Eyes:     Conjunctiva/sclera: Conjunctivae normal.  Cardiovascular:     Rate and Rhythm: Normal rate and regular rhythm.     Heart sounds: Normal heart sounds. No murmur. No friction rub. No gallop.   Pulmonary:     Effort: Pulmonary effort is normal. No respiratory distress.     Breath sounds: Normal breath sounds. No wheezing or rales.  Abdominal:     General: There is no distension.     Palpations: Abdomen is soft.     Tenderness: There is no abdominal tenderness. There is no guarding.  Musculoskeletal:        General: No tenderness.     Cervical back: Normal range of motion.  Skin:    General: Skin is warm and dry.     Findings: No erythema or rash.  Neurological:     Mental Status: She is alert and oriented to person, place, and time.     ED Results / Procedures / Treatments   Labs (all labs ordered are listed, but only abnormal results are displayed) Labs  Reviewed  CBC WITH DIFFERENTIAL/PLATELET - Abnormal; Notable for the following components:      Result Value   RDW 16.8 (*)    All other components within normal limits  BASIC METABOLIC PANEL - Abnormal; Notable for the following components:   Glucose, Bld 199 (*)    Creatinine, Ser 1.52 (*)    GFR calc non Af Amer 37 (*)  GFR calc Af Amer 43 (*)    All other components within normal limits  BRAIN NATRIURETIC PEPTIDE - Abnormal; Notable for the following components:   B Natriuretic Peptide 113.7 (*)    All other components within normal limits  HEPATIC FUNCTION PANEL - Abnormal; Notable for the following components:   AST 69 (*)    ALT 70 (*)    Alkaline Phosphatase 128 (*)    All other components within normal limits  TROPONIN I (HIGH SENSITIVITY) - Abnormal; Notable for the following components:   Troponin I (High Sensitivity) 33 (*)    All other components within normal limits  TROPONIN I (HIGH SENSITIVITY) - Abnormal; Notable for the following components:   Troponin I (High Sensitivity) 34 (*)    All other components within normal limits  RESPIRATORY PANEL BY RT PCR (FLU A&B, COVID)    EKG EKG Interpretation  Date/Time:  Tuesday Jan 05 2020 16:45:24 EDT Ventricular Rate:  83 PR Interval:  154 QRS Duration: 126 QT Interval:  424 QTC Calculation: 498 R Axis:   108 Text Interpretation: Atrial-sensed ventricular-paced rhythm Abnormal ECG No significant change since last tracing Confirmed by Gareth Morgan [phone]) on 01/05/2020 5:08:49 PM   Radiology DG Chest Portable 1 View  Result Date: 01/05/2020 CLINICAL DATA:  Shortness of breath, nausea, palpitations EXAM: PORTABLE CHEST 1 VIEW COMPARISON:  10/06/2018 FINDINGS: Cardiomegaly with left chest multi lead pacer defibrillator. Both lungs are clear. Status post bilateral shoulder arthroplasty. IMPRESSION: Cardiomegaly without acute abnormality of the lungs. Electronically Signed   By: Eddie Candle M.D.   On: 01/05/2020  17:37    Procedures Procedures (including critical care time)  Medications Ordered in ED Medications  aspirin chewable tablet 324 mg (has no administration in time range)    ED Course  I have reviewed the triage vital signs and the nursing notes.  Pertinent labs & imaging results that were available during my care of the patient were reviewed by me and considered in my medical decision making (see chart for details).    MDM Rules/Calculators/A&P                      Very pleasant 59 year old female with a history of coronary artery disease, CHF with an ejection fraction 25 to 30%, ICD in place, diabetes, hypertension, hyperlipidemia, peripheral vascular disease, CKD, presents with concern for chest pain, palpitations, and nausea.  Do not see signs of volume overload on history or exam, BNP is within normal limits.  Have low suspicion for pulmonary embolus given patient without primary shortness of breath, no asymmetric leg swelling, no pleuritic pain.  History and exam are not consistent with aortic dissection.  ICD was interrogated without recent significant events, no discharges, and 71-month battery life.    Troponin 33, second troponin stable.  Discussed given her risk factors, and symptoms I would like to admit her to the hospital for further cardiac evaluation. Discussed with Cardiology fellow. Will admit to hospitalist with cardiology happy to help as needed.    Final Clinical Impression(s) / ED Diagnoses Final diagnoses:  Chest pain, unspecified type    Rx / DC Orders ED Discharge Orders    None       Gareth Morgan, MD 01/06/20 0028

## 2020-01-05 NOTE — Telephone Encounter (Signed)
Attempted to reach [facility] but had to leave message with operator. Direct DC phone number provided. Per Dr. Macky Lower 11/05/19 OV note, need to clarify whether patient is active in a study through [facility] prior to transferring remote monitoring. As of that time, patient's battery estimate was 9.6 months.  LMOVM for patient requesting call back to DC. Direct number and office hours provided. Patient presented to [facility] ED for further assessment per notes in Epic.

## 2020-01-05 NOTE — Telephone Encounter (Signed)
Received a call from [place] reporting that patient complained of chest pain, mild shortness of breath, left arm pain and nausea. He advised her to go to hospital in which she did go.  He advised that she come to [facility] however patient declined.   Pt decided to go to her local hospital as she lives in [place]. Routed to Dr Haroldine Laws as Juluis Rainier

## 2020-01-05 NOTE — Telephone Encounter (Signed)
Pt left a voicemail that she thinks her device battery is low. She wants to know if she should make an appointment with the office about it.  She is not in our [employer] and we do not have her device information in [facility].

## 2020-01-05 NOTE — ED Notes (Signed)
AICD Information: [facility] Number [id number] Serial Number [id number] Implant Date Sept 15th, 2015  Last time device interrogated Feb 2021  States that she has not felt her device deliver any shocks

## 2020-01-05 NOTE — ED Triage Notes (Signed)
Yesterday she had nausea. Afterward she had chest palpitations.  She has a  Secretary/administrator that she feels the battery has stopped working.

## 2020-01-05 NOTE — ED Notes (Signed)
ED Nursing Note: AICD re-interrogated, was able to finally transmit information to [facility], was informed that no shocks have been delivered, also pt has battery life of 8 months per Rep information by phone. [employer] Rep will provide FAX report ASAP for ED evaluation as well

## 2020-01-06 DIAGNOSIS — R079 Chest pain, unspecified: Secondary | ICD-10-CM

## 2020-01-06 DIAGNOSIS — R072 Precordial pain: Secondary | ICD-10-CM | POA: Diagnosis not present

## 2020-01-06 LAB — GLUCOSE, CAPILLARY
Glucose-Capillary: 152 mg/dL — ABNORMAL HIGH (ref 70–99)
Glucose-Capillary: 137 mg/dL — ABNORMAL HIGH (ref 70–99)
Glucose-Capillary: 129 mg/dL — ABNORMAL HIGH (ref 70–99)
Glucose-Capillary: 116 mg/dL — ABNORMAL HIGH (ref 70–99)
Glucose-Capillary: 269 mg/dL — ABNORMAL HIGH (ref 70–99)

## 2020-01-06 LAB — TROPONIN I (HIGH SENSITIVITY): Troponin I (High Sensitivity): 40 ng/L — ABNORMAL HIGH (ref <18)

## 2020-01-06 LAB — TSH: TSH: 2.23 u[IU]/mL (ref 0.350–4.500)

## 2020-01-06 LAB — RESPIRATORY PANEL BY RT PCR (FLU A&B, COVID)
Influenza A by PCR: NEGATIVE
Influenza B by PCR: NEGATIVE
SARS Coronavirus 2 by RT PCR: NEGATIVE

## 2020-01-06 LAB — HEMOGLOBIN A1C
Hgb A1c MFr Bld: 8.6 % — ABNORMAL HIGH (ref 4.8–5.6)
Mean Plasma Glucose: 200.12 mg/dL

## 2020-01-06 MED ORDER — INSULIN ASPART 100 UNIT/ML ~~LOC~~ SOLN
0.0000 [IU] | Freq: Every day | SUBCUTANEOUS | Status: DC
  Administered 2020-01-06: 3 [IU] via SUBCUTANEOUS

## 2020-01-06 MED ORDER — ASPIRIN 81 MG PO CHEW
324.0000 mg | CHEWABLE_TABLET | Freq: Once | ORAL | Status: AC
  Administered 2020-01-06: 324 mg via ORAL
  Filled 2020-01-06: qty 4

## 2020-01-06 MED ORDER — SERTRALINE HCL 50 MG PO TABS
150.0000 mg | ORAL_TABLET | Freq: Every day | ORAL | Status: DC
  Administered 2020-01-06 – 2020-01-08 (×3): 150 mg via ORAL
  Filled 2020-01-06 (×4): qty 1

## 2020-01-06 MED ORDER — ISOSORBIDE MONONITRATE ER 60 MG PO TB24
60.0000 mg | ORAL_TABLET | Freq: Every day | ORAL | Status: DC
  Administered 2020-01-06 – 2020-01-09 (×4): 60 mg via ORAL
  Filled 2020-01-06 (×4): qty 1

## 2020-01-06 MED ORDER — ALLOPURINOL 100 MG PO TABS
200.0000 mg | ORAL_TABLET | Freq: Every day | ORAL | Status: DC
  Administered 2020-01-06 – 2020-01-09 (×4): 200 mg via ORAL
  Filled 2020-01-06 (×4): qty 2

## 2020-01-06 MED ORDER — COLCHICINE 0.6 MG PO TABS: 0.6000 mg | ORAL_TABLET | ORAL | Status: DC

## 2020-01-06 MED ORDER — INSULIN ASPART 100 UNIT/ML ~~LOC~~ SOLN
0.0000 [IU] | Freq: Three times a day (TID) | SUBCUTANEOUS | Status: DC
  Administered 2020-01-07: 3 [IU] via SUBCUTANEOUS
  Administered 2020-01-07: 8 [IU] via SUBCUTANEOUS
  Administered 2020-01-08: 3 [IU] via SUBCUTANEOUS
  Administered 2020-01-08: 11 [IU] via SUBCUTANEOUS
  Administered 2020-01-09: 2 [IU] via SUBCUTANEOUS

## 2020-01-06 MED ORDER — ACETAMINOPHEN 325 MG PO TABS: 650.0000 mg | ORAL_TABLET | ORAL | Status: DC | PRN

## 2020-01-06 MED ORDER — TORSEMIDE 20 MG PO TABS
20.0000 mg | ORAL_TABLET | Freq: Two times a day (BID) | ORAL | Status: DC
  Administered 2020-01-06 – 2020-01-07 (×2): 20 mg via ORAL
  Filled 2020-01-06 (×2): qty 1

## 2020-01-06 MED ORDER — AMIODARONE HCL 200 MG PO TABS
200.0000 mg | ORAL_TABLET | Freq: Every day | ORAL | Status: DC
  Administered 2020-01-06 – 2020-01-09 (×4): 200 mg via ORAL
  Filled 2020-01-06 (×4): qty 1

## 2020-01-06 MED ORDER — ONDANSETRON HCL 4 MG/2ML IJ SOLN: 4.0000 mg | Freq: Four times a day (QID) | INTRAMUSCULAR | Status: DC | PRN

## 2020-01-06 MED ORDER — HYDRALAZINE HCL 50 MG PO TABS
75.0000 mg | ORAL_TABLET | Freq: Three times a day (TID) | ORAL | Status: DC
  Administered 2020-01-06 – 2020-01-09 (×7): 75 mg via ORAL
  Filled 2020-01-06 (×10): qty 1

## 2020-01-06 MED ORDER — ATORVASTATIN CALCIUM 40 MG PO TABS
40.0000 mg | ORAL_TABLET | Freq: Every day | ORAL | Status: DC
  Administered 2020-01-06 – 2020-01-08 (×3): 40 mg via ORAL
  Filled 2020-01-06 (×4): qty 1

## 2020-01-06 MED ORDER — ASPIRIN 81 MG PO CHEW
81.0000 mg | CHEWABLE_TABLET | Freq: Every day | ORAL | Status: DC
  Administered 2020-01-06 – 2020-01-09 (×3): 81 mg via ORAL
  Filled 2020-01-06 (×3): qty 1

## 2020-01-06 MED ORDER — CARVEDILOL 12.5 MG PO TABS: 12.5000 mg | ORAL_TABLET | Freq: Two times a day (BID) | ORAL | Status: DC

## 2020-01-06 MED ORDER — CARVEDILOL 12.5 MG PO TABS
12.5000 mg | ORAL_TABLET | Freq: Two times a day (BID) | ORAL | Status: DC
  Administered 2020-01-06 – 2020-01-09 (×5): 12.5 mg via ORAL
  Filled 2020-01-06 (×5): qty 1

## 2020-01-06 NOTE — Progress Notes (Signed)
Paged [facility] admissions that patient has arrived to unit.

## 2020-01-06 NOTE — H&P (Signed)
History and Physical  Patient Name: Michelle Andrade     DXI:[id number]    DOB: [birth year]    DOA: 01/05/2020 PCP: Henderson Baltimore, [name]   Patient coming from: Home     Chief Complaint: Chest pain  HPI: Michelle Andrade is a 59 y.o. female with a past medical history significant for sCHF EF 35%, CAD, obesity, disability, OSA not on CPAP, and DM who presents with chest pain.  The patient was in her usual health the last 24 hours.  She had had some fleeting, sharp, moderate to severe left-sided chest pain, then on the day of admission, she noticed a left-sided dull discomfort that was present at rest, and did not go away.  It was nonpositional, worsened with food, worsened with deep breathing, or lifting things.  ED course: -Afebrile, heart rate normal, respirations and blood pressure normal, pulse ox normal on room air -Initial ECG showed paced rhythm and high-sensitivity troponin was detectable but low and flat. -Na 136, K 3.8, Cr 1.5 (baseline 2.2), WBC 2.9, Hgb 13.6 -Chest x-ray was unremarkable -TRH was asked to admit for observation, serial troponins and risk stratification.     Review of Systems:  Review of Systems  Constitutional: Negative for fever.  Respiratory: Positive for shortness of breath. Negative for cough, hemoptysis, sputum production and wheezing.   Cardiovascular: Positive for chest pain. Negative for palpitations, orthopnea, claudication, leg swelling and PND.  All other systems reviewed and are negative.    Past Medical History:  Diagnosis Date  . Arthritis 02/08/2017  . 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 CAD (coronary artery disease) 09/01/2013  . CHF (congestive heart failure) ([place]) 06/07/2015  . CKD (chronic kidney disease), stage III 12/09/2013  . Depression 07/27/2013  . Diabetes mellitus without complication ([place]) 76/73/4193  . GERD (gastroesophageal reflux disease) 07/27/2013  . Gout 12/29/2013  .  Heart valve disorder 06/03/2013  . High cholesterol 11/10/2013  . Hypertension 11/10/2013  . IBS (irritable bowel syndrome) 09/27/2014  . Myocardial infarct ([place])   . Sleep apnea 10/13/2013  . Vascular disorder 01/12/2014    Past Surgical History:  Procedure Laterality Date  . ABDOMINAL HYSTERECTOMY    . CARDIAC DEFIBRILLATOR PLACEMENT    . CARPAL TUNNEL RELEASE    . HEEL SPUR EXCISION    . KNEE SURGERY    . PACEMAKER GENERATOR CHANGE    . RIGHT HEART CATH N/A 02/13/2017   Procedure: Right Heart Cath;  Surgeon: Jolaine Artist, MD;  Location: [facility];  Service: Cardiovascular;  Laterality: N/A;  . SHOULDER SURGERY      Social History: Patient lives alone.  Patient walks unassisted.  She is unemployed.  She is on disability.  She does not smoke.   Allergies  Allergen Reactions  . Codeine Itching  . Gabapentin Itching  . Haldol [Haloperidol Lactate] Other (See Comments)    Dizziness, hallucinations  . Levofloxacin     Other reaction(s): Other (See Comments) Projectile vomiting   . Lisinopril Itching and Swelling  . Losartan Itching  . Morphine And Related Nausea And Vomiting  . Penicillins Nausea And Vomiting    "Vomiting, upset stomach, Headache" Has patient had a PCN reaction causing immediate rash, facial/tongue/throat swelling, SOB or lightheadedness with hypotension: No Has patient had a PCN reaction causing severe rash involving mucus membranes or skin necrosis: No Has patient had a PCN reaction that required hospitalization: No Has patient had a PCN reaction occurring within the last 10 years:  No If all of the above answers are "NO", then may proceed with Cephalosporin use.     Family history: family history includes Hypertension in her mother.  Prior to Admission medications   Medication Sig Start Date End Date Taking? Authorizing Provider  allopurinol (ZYLOPRIM) 100 MG tablet Take 200 mg by mouth daily.    Yes [provider]   amiodarone (PACERONE) 200 MG tablet Take 1 tablet (200 mg total) by mouth daily. 11/26/19  Yes Bensimhon, Shaune Pascal, MD  aspirin 81 MG chewable tablet Chew 81 mg by mouth daily.   Yes [provider]  atorvastatin (LIPITOR) 40 MG tablet Take 40 mg by mouth at bedtime.    Yes [provider]  Calcium Carbonate-Vitamin D 600-400 MG-UNIT tablet Take 1 tablet by mouth in the morning.  07/14/12  Yes [provider]  Calcium Carbonate-Vitamin D3 (CALCIUM 600/VITAMIN D) 600-400 MG-UNIT TABS Take 1 tablet by mouth 2 (two) times daily.    Yes [provider]  carvedilol (COREG) 25 MG tablet Take 12.5 mg by mouth 2 (two) times daily with a meal.   Yes [provider]  colchicine 0.6 MG tablet Take 0.6-1.2 mg by mouth See admin instructions.  12/09/18  Yes [provider]  cyclobenzaprine (FLEXERIL) 5 MG tablet Take 5 mg by mouth 3 (three) times daily as needed for muscle spasms.   Yes [provider]  diclofenac sodium (VOLTAREN) 1 % GEL Apply 2 g topically 4 (four) times daily. Patient taking differently: Apply 2 g topically 2 (two) times daily as needed (arthritis pain).  03/27/18  Yes Caccavale, Sophia, PA-C  ferrous sulfate 325 (65 FE) MG tablet Take 325 mg by mouth 3 (three) times daily.    Yes [provider]  hydrALAZINE (APRESOLINE) 50 MG tablet Take 1.5 tablets (75 mg total) by mouth 3 (three) times daily. 11/05/18  Yes Bensimhon, Shaune Pascal, MD  isosorbide mononitrate (IMDUR) 60 MG 24 hr tablet Take 1 tablet (60 mg total) by mouth daily. 02/16/17  Yes Mariel Aloe, MD  loratadine (CLARITIN) 10 MG tablet Take 10 mg by mouth daily.   Yes [provider]  Magnesium Oxide 420 MG TABS Take 840 mg by mouth daily.   Yes [provider]  Multiple Vitamin (MULTIVITAMIN) tablet Take 1 tablet by mouth daily.   Yes [provider]  omeprazole (PRILOSEC) 40 MG capsule Take 40 mg by mouth daily.   Yes [provider]  potassium chloride (KLOR-CON) 10 MEQ tablet Take 4 tablets (40 mEq total) by mouth daily. 07/15/19  Yes Clegg, Amy D, NP  rOPINIRole (REQUIP) 0.5 MG tablet Take 0.5 mg by mouth at bedtime.    Yes [provider]  sertraline (ZOLOFT) 100 MG tablet Take 150 mg by mouth at bedtime.    Yes [provider]  torsemide (DEMADEX) 20 MG tablet Take 1 tablet (20 mg total) by mouth 2 (two) times daily. 04/11/17  Yes Shirley Friar, PA-C  TRULICITY 9.61 YO/3.7CH SOPN Inject 0.75 mg into the skin every Sunday.  12/20/19  Yes [provider]  Investigational - Study Medication Take 1 tablet by mouth 2 (two) times daily. Study name: Galactic Heart Failure Study Additional study details: Omecamtiv Mecarbil or Placebo Patient not taking: Reported on 11/11/2019 02/15/17   Larey Dresser, MD       Physical Exam: BP (!) 142/78 (BP Location: Left Arm)   Pulse 67   Temp 98.6 F (37 C) (Oral)  Resp 20   Ht 5\' 3"  (1.6 m)   Wt 78.2 kg   SpO2 98%   BMI 30.52 kg/m  General appearance: Well-developed, adult female, alert and in no acute distress.   Eyes: Anicteric, conjunctiva pink, lids and lashes normal.     ENT: No nasal deformity, discharge, or epistaxis.  OP moist without lesions.   Skin: Warm and dry.   Cardiac: RRR, nl S1-S2, no murmurs appreciated.  Capillary refill is brisk.  JVP not visible.  No LE edema.  Radial pulses 2+ and symmetric.  No carotid bruits. Respiratory: Normal respiratory rate and rhythm.  CTAB without rales or wheezes. GI: Abdomen soft without rigidity.  No TTP. No ascites, distension.   MSK: No deformities or effusions.   Pain possibly reproduced with palpation of precordium.  No pain with arm movement. Neuro: Sensorium intact and responding to questions, attention normal.  Speech is fluent.  Moves all extremities equally and with normal coordination.    Psych: Behavior appropriate.  Affect normal.  No evidence of aural or visual  hallucinations or delusions.       Labs on Admission:  The metabolic panel shows normal electrolytes, mild CKD. The complete blood count shows normal white count. The initial troponin is minimally elevated.  Radiological Exams on Admission: Personally reviewed chest x-ray shows no focal airspace disease, pneumothorax, or opacity: DG Chest Portable 1 View  Result Date: 01/05/2020 CLINICAL DATA:  Shortness of breath, nausea, palpitations EXAM: PORTABLE CHEST 1 VIEW COMPARISON:  10/06/2018 FINDINGS: Cardiomegaly with left chest multi lead pacer defibrillator. Both lungs are clear. Status post bilateral shoulder arthroplasty. IMPRESSION: Cardiomegaly without acute abnormality of the lungs. Electronically Signed   By: Eddie Candle M.D.   On: 01/05/2020 17:37    EKG: Independently reviewed.  Paced rhythm.    Assessment/Plan  1. Chest pain: The patient's chest symptoms are atypical for angina.  Other potential causes of chest pain such as PE, dissection, pneumonia, or pericarditis are doubted. I suspect this is chest wall pain.  We have been asked to admit the patient for observation and etiology consultation with Cardiology -Consult cardiology, appreciate recommendations    2.  Chronic systolic CHF HTN Hx BiV pacer Coronary disease, secondary prevention  Appears well compensated from CHF standpoint. -Continue amiodarone, baby aspirin, atorvastatin -Continue carvedilol, hydralazine -Continue torsemide  3. Diabetes:  -Hold trulicity -SS insulin corrections  4. Obesity:  BMI 30.5 with DM, CAD, OSA.  5. OSA:  Stable.  6. CKD IIIa:  Stable.   7.  Mood disorder:  Stable. -Continue sertraline  8. Gout: No active flare -Continue allpurinol    DVT prophylaxis: Low risk  Code Status: FULL  Family Communication:   Disposition Plan: Anticipate overnight observation for arrhythmia on telemetry, serial troponins and subsequent  risk stratification by Cardiology.  If  testing negative, home after. Consults called: Cardiology Admission status: Telemetry   Medical decision making:   What exists of the patient's chart was reviewed in depth.  Clinical condition: stable.      [facility]

## 2020-01-06 NOTE — Progress Notes (Addendum)
Cardiology Consultation:   Patient ID: Michelle Andrade MRN: [id number]; DOB: March 04, 1961  Admit date: 01/05/2020 Date of Consult: 01/06/2020  Primary Care Provider: Henderson Baltimore, [name] Primary Cardiologist: Glori Bickers, MD Primary Electrophysiologist:  None    Patient Profile:   Michelle Andrade is a 59 y.o. female with a hx of HTN, chronic systolic CHF s/p BI V ICD (St Jude), atrial tachycardia (2017), NICM (normal cors on 2014), CKD stage III, HLD, DM, OSA on CPAP and anema who is being seen today for the evaluation of chest pain at the request of Dr. [name] Books.  History of Present Illness:   Ms. Bolin is followed by Dr. Haroldine Laws. Prior to that she was followed by Dr. Otho Perl at [facility].  Patient has a history of NICM with normal cors in 2014, thought to be related to HTN vs noncompliance vs tachy-mediated with history of atrial tach.  Patient was admitted 12/2016 to [facility] with volume overload. Echo at that time showed EF 25-30%. She was  Discharged on spiro 78m, Coreg 237mBID, and Hydralazine 12.66m61maily. Discharge weight 177lbs. Patient has history of angioedema on ACE and cough with ARB.   [facility] bloating started on IV lasix. RHC ws done which showed well compensated filling pressures and mildly reduced cardiac output. EF estimated at 20%.   Echo from 08/2017 showed EF 25-30%. Last seen by Dr. BenHaroldine Laws4/20 and stable from a volume standpoint. Echo at that time showed EF 30-35%.   Patient was most recently seen by Dr. CamCurt Bears4/21 for CHF s/p St Jude CRT-D that had [place]. She was on torsemide 40 mg BID, isosorbide 60 mg daily, coreg 12.5 mg BID, hydralazine 37.66mg83mD. Spironolactone and metolazone had been previously discontinued due to worsening kidney function. She is on amiodarone for atrial tachycardia.  The patient presented to the ED 01/05/20 for chest pain. The patient says she has intermittent sharp twinges  of chest pain about once a week. 3 days ago she felt sharp chest pain on the left side underneath her left breast. It was 6/10 and patient felt associated nasuea and sob. The pain lingered on and went up into her neck. Pain was waxing and waning over the last few days. Walking did not seem to make it worse. When the pain persisted the patient decided to go to the ER for evaluation. She denies recent fever, chills, illness, LLE, abdominal bloating, weight gain.   In the ED BP 137/74, pulse 67, Temp 99.5, RR 32, 96% O2. Labs showed creatinine 1.52. BNP 113. AST 69, ALT 70, alk phos 128. Hs trop 33>34. EKG showed A-sensed, v paced rhythm with no significant ST/T wave changes. CXR showed cardiomegaly without acute abnormality of the lungs. ICD was interrogated without recent significant events, discharges.    Past Medical History:  Diagnosis Date  . Arthritis 02/08/2017  . 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 CAD (coronary artery disease) 09/01/2013  . CHF (congestive heart failure) (HCC)[place]/12/2014  . CKD (chronic kidney disease), stage III 12/09/2013  . Depression 07/27/2013  . Diabetes mellitus without complication (HCC)[place]/[id number] (gastroesophageal reflux disease) 07/27/2013  . Gout 12/29/2013  . Heart valve disorder 06/03/2013  . High cholesterol 11/10/2013  . Hypertension 11/10/2013  . IBS (irritable bowel syndrome) 09/27/2014  . Myocardial infarct (HCC)[place]. Sleep apnea 10/13/2013  . Vascular disorder 01/12/2014    Past Surgical History:  Procedure Laterality Date  . ABDOMINAL HYSTERECTOMY    . CARDIAC DEFIBRILLATOR PLACEMENT    . CARPAL TUNNEL RELEASE    . HEEL SPUR EXCISION    . KNEE SURGERY    . PACEMAKER GENERATOR CHANGE    . RIGHT HEART CATH N/A 02/13/2017   Procedure: Right Heart Cath;  Surgeon: Jolaine Artist, MD;  Location: [place] [facility];  Service: Cardiovascular;  Laterality: N/A;  . SHOULDER SURGERY        Home Medications:  Prior to Admission medications   Medication Sig Start Date End Date Taking? Authorizing Provider  allopurinol (ZYLOPRIM) 100 MG tablet Take 100 mg by mouth daily.    [provider]  amiodarone (PACERONE) 200 MG tablet Take 1 tablet (200 mg total) by mouth daily. 11/26/19   Klinton Candelas, Shaune Pascal, MD  aspirin 81 MG chewable tablet Chew 81 mg by mouth daily.    [provider]  atorvastatin (LIPITOR) 40 MG tablet Take 40 mg by mouth daily.     [provider]  Calcium Carbonate-Vitamin D 600-400 MG-UNIT tablet Take by mouth. 07/14/12   [provider]  Calcium Carbonate-Vitamin D3 (CALCIUM 600/VITAMIN D) 600-400 MG-UNIT TABS Take 1 tablet by mouth 2 (two) times daily.     [provider]  carvedilol (COREG) 25 MG tablet Take 12.5 mg by mouth 2 (two) times daily with a meal.    [provider]  colchicine 0.6 MG tablet Take 2 tablets now and 1 an hour later 12/09/18   [provider]  diclofenac sodium (VOLTAREN) 1 % GEL Apply 2 g topically 4 (four) times daily. 03/27/18   Caccavale, Sophia, PA-C  ferrous sulfate 325 (65 FE) MG tablet Take 325 mg by mouth 3 (three) times daily.     [provider]  glipiZIDE (GLUCOTROL) 5 MG tablet Take 2.5 mg by mouth daily before breakfast.    [provider]  hydrALAZINE (APRESOLINE) 50 MG tablet Take 1.5 tablets (75 mg total) by mouth 3 (three) times daily. 11/05/18   Gearline Spilman, Shaune Pascal, MD  Investigational - Study Medication Take 1 tablet by mouth 2 (two) times daily. Study name: Galactic Heart Failure Study Additional study details: Omecamtiv Mecarbil or Placebo Patient not taking: Reported on 11/11/2019 02/15/17   Larey Dresser, MD  isosorbide mononitrate (IMDUR) 60 MG 24 hr tablet Take 1 tablet (60 mg total) by mouth daily. 02/16/17   Mariel Aloe, MD  loratadine (CLARITIN) 10 MG tablet Take 10 mg by mouth daily.    [provider]  Magnesium Oxide  420 MG TABS Take 840 mg by mouth daily.    [provider]  Multiple Vitamin (MULTIVITAMIN) tablet Take 1 tablet by mouth daily.    [provider]  omeprazole (PRILOSEC) 40 MG capsule Take 40 mg by mouth daily.    [provider]  potassium chloride (KLOR-CON) 10 MEQ tablet Take 4 tablets (40 mEq total) by mouth daily. 07/15/19   Clegg, Amy D, NP  rOPINIRole (REQUIP) 0.5 MG tablet Take 0.5 mg by mouth at bedtime.     [provider]  sertraline (ZOLOFT) 100 MG tablet Take 150 mg by mouth daily.    [provider]  torsemide (DEMADEX) 20 MG tablet Take 1 tablet (20 mg total) by mouth 2 (two) times daily. 04/11/17   Shirley Friar, PA-C  TRULICITY 6.72 CN/4.7SJ SOPN Inject 0.75 mg into the skin once a week. 12/20/19   [provider]  Inpatient Medications: Scheduled Meds:  Continuous Infusions:  PRN Meds: acetaminophen, ondansetron (ZOFRAN) IV  Allergies:    Allergies  Allergen Reactions  . Codeine Itching  . Gabapentin Itching  . Haldol [Haloperidol Lactate] Other (See Comments)    Dizziness, hallucinations  . Levofloxacin     Other reaction(s): Other (See Comments) Projectile vomiting   . Lisinopril Itching and Swelling  . Losartan Itching  . Morphine And Related Nausea And Vomiting  . Penicillins Nausea And Vomiting    "Vomiting, upset stomach, Headache" Has patient had a PCN reaction causing immediate rash, facial/tongue/throat swelling, SOB or lightheadedness with hypotension: No Has patient had a PCN reaction causing severe rash involving mucus membranes or skin necrosis: No Has patient had a PCN reaction that required hospitalization: No Has patient had a PCN reaction occurring within the last 10 years: No If all of the above answers are "NO", then may proceed with Cephalosporin use.     Social History:   Social History   Socioeconomic History  . Marital status: Single    Spouse name: Not on file  .  Number of children: Not on file  . Years of education: Not on file  . Highest education level: Not on file  Occupational History  . Not on file  Tobacco Use  . Smoking status: Never Smoker  . Smokeless tobacco: Never Used  Substance and Sexual Activity  . Alcohol use: No  . Drug use: No  . Sexual activity: Not on file  Other Topics Concern  . Not on file  Social History Narrative  . Not on file   Social Determinants of Health   Financial Resource Strain:   . Difficulty of Paying Living Expenses:   Food Insecurity:   . Worried About Charity fundraiser in the Last Year:   . Arboriculturist in the Last Year:   Transportation Needs:   . Film/video editor (Medical):   [name] Lack of Transportation (Non-Medical):   Physical Activity:   . Days of Exercise per Week:   . Minutes of Exercise per Session:   Stress:   . Feeling of Stress :   Social Connections:   . Frequency of Communication with Friends and Family:   . Frequency of Social Gatherings with Friends and Family:   . Attends Religious Services:   . Active Member of Clubs or Organizations:   . Attends Archivist Meetings:   [name] Marital Status:   Intimate Partner Violence:   . Fear of Current or Ex-Partner:   . Emotionally Abused:   [name] Physically Abused:   . Sexually Abused:     Family History:   Family History  Problem Relation Age of Onset  . Hypertension Mother      ROS:  Please see the history of present illness.  All other ROS reviewed and negative.     Physical Exam/Data:   Vitals:   01/06/20 0101 01/06/20 0225 01/06/20 0627 01/06/20 0741  BP: (!) 141/81 (!) 144/81 (!) 154/76 (!) 142/78  Pulse: 68 72  67  Resp: (!) 22 20 20 20   Temp: 99.2 F (37.3 C) 98.2 F (36.8 C) 98 F (36.7 C) 98.6 F (37 C)  TempSrc: Oral Oral Oral Oral  SpO2: 94% 98% 97% 98%  Weight:  78.2 kg 78.2 kg   Height:  5' 3"  (1.6 m)     No intake or output data in the 24 hours ending 01/06/20 1216 Last 3 Weights  01/06/2020  01/06/2020 01/05/2020  Weight (lbs) 172 lb 4.8 oz 172 lb 4.8 oz 177 lb 14.6 oz  Weight (kg) 78.155 kg 78.155 kg 80.7 kg     Body mass index is 30.52 kg/m.  General:  Well nourished, well developed, in no acute distress HEENT: normal Lymph: no adenopathy Neck: no JVD Endocrine:  No thryomegaly Vascular: No carotid bruits; FA pulses 2+ bilaterally without bruits  Cardiac:  normal S1, S2; RRR; no murmur  Lungs:  clear to auscultation bilaterally, no wheezing, rhonchi or rales  Abd: soft, nontender, no hepatomegaly  Ext: no edema Musculoskeletal:  No deformities, BUE and BLE strength normal and equal Skin: warm and dry  Neuro:  CNs 2-12 intact, no focal abnormalities noted Psych:  Normal affect   EKG:  The EKG was personally reviewed and demonstrates:  A-sensed V paced rhythm, Hr in the 60s Telemetry:  Telemetry was personally reviewed and demonstrates:  Paced rhythm with Hr int he 60s  Relevant CV Studies:  Echo 11/2018 1. The left ventricle has moderate-severely reduced systolic function,  with an ejection fraction of 30-35%. The cavity size was normal. Left  ventricular diastolic Doppler parameters are consistent with impaired  relaxation Elevated left ventricular  end-diastolic pressure Left ventricular diffuse hypokinesis.  2. The right ventricle has normal systolic function. The cavity was  normal. There is no increase in right ventricular wall thickness. Right  ventricular systolic pressure normal with an estimated pressure of 26.7  mmHg.  3. Left atrial size was severely dilated.  4. The mitral valve is normal in structure. Mild thickening of the mitral  valve leaflet. Mild calcification of the anterior mitral valve leaflet.  5. The tricuspid valve is normal in structure.  6. The aortic valve is tricuspid Mild sclerosis of the aortic valve.  7. The pulmonic valve was normal in structure.    Right Heart Cardiac Cath 2018 Findings:  RA = 5 RV = 49/4 PA =  55/18 (35) PCW = 18 Fick cardiac output/index = 4.5/2.5 Thermo CO/CI = 4.5/2.5 PVR = 3.8 WU FA sat = 95% PA sat = 54%, 54%  Assessment: 1. Well-compensated filling pressures 2. Mildly reduced cardiac output  Plan/Discussion:  Continue current therapy. Possible home in am.    Laboratory Data:  High Sensitivity Troponin:   Recent Labs  Lab 01/05/20 1910 01/05/20 2133 01/06/20 1053  TROPONINIHS 33* 34* 40*     Chemistry Recent Labs  Lab 01/05/20 1712  NA 136  K 3.8  CL 100  CO2 24  GLUCOSE 199*  BUN 16  CREATININE 1.52*  CALCIUM 9.0  GFRNONAA 37*  GFRAA 43*  ANIONGAP 12    Recent Labs  Lab 01/05/20 1911  PROT 7.7  ALBUMIN 3.5  AST 69*  ALT 70*  ALKPHOS 128*  BILITOT 0.4   Hematology Recent Labs  Lab 01/05/20 1712  WBC 6.9  RBC 4.45  HGB 13.6  HCT 41.0  MCV 92.1  MCH 30.6  MCHC 33.2  RDW 16.8*  PLT 255   BNP Recent Labs  Lab 01/05/20 1712  BNP 113.7*    DDimer No results for input(s): DDIMER in the last 168 hours.   Radiology/Studies:  DG Chest Portable 1 View  Result Date: 01/05/2020 CLINICAL DATA:  Shortness of breath, nausea, palpitations EXAM: PORTABLE CHEST 1 VIEW COMPARISON:  10/06/2018 FINDINGS: Cardiomegaly with left chest multi lead pacer defibrillator. Both lungs are clear. Status post bilateral shoulder arthroplasty. IMPRESSION: Cardiomegaly without acute abnormality of the lungs. Electronically Signed  By: Eddie Candle M.D.   On: 01/05/2020 17:37   { HEAR Score (for undifferentiated chest pain):  HEAR Score: 5    Assessment and Plan:   Atypical chest pain Patient presents with 3 days of left sided chest pain that has been waxing and waning with associated N/sob. Not worse with exertion. Is reproducible with palpation. - HS troponin 33>34. Will check another level - EKG without ischemic changes - H/o of nonobstructive CAD by cath in 2014 (per Dr. Sudie Grumbling note) - Patient is TTP on exam suggestive of MSK etiology -  Patient says she still has dull chest pain. Given CKD would prefer Myoview stress test over Cardiac CT for CAD evaluation. - MD to see  Chronic systolic CHF Suspected NICM due to normal cors in 2014. Possibly tachy-mediated vs HTN - Echo 02/2017 EF 20%, moderate MR, moderate TR, Pa pressure 61mHg - Echo 08/2017 EF 20-25%, personally reviewed by Dr. BHaroldine Lawsand felt to be 25-30% - Echo 11/05/19 showed EF 30-35%, diffuse hypokinesis, normal RV systolic pressure, severely dialted LA, mild thickening of MV, mild AS - On admission BNP normal. CXR unremarkable. volume status stable - continued torsemide 20 mg BID - Imdur 60 mg daily  - coreg 12.56mBID - Hydralazine 7537mID - Not on spironolactone and metolazone 2/2 to kidney function - No ACE due angioedema and ARB 2/2 to renal function  CKD stage 3 - baseline creatinine 1.3-1.6 - on admission 1.52 - follows with nephrology outpatient  HTN  - stable on current regimen  S/P St. Jude CRT-D - ICD was interrogated without recent significant events, discharges, 8 months battery left  History of atrial tachycardia - amiodarone - AST 69, ALT 70, alk phos 128. If continues to worsen might need to discontinue amiodarone  HLD - atorvastatin 40 mg daily - LDL 97 in 2018 (care everywhere) - recheck Lipids  Dm2 - check A1C  For questions or updates, please contact [facility] consult [email or web address] for contact info under    Signed, Cadence H FNinfa MeekerA-C  01/06/2020 12:16 PM   Patient seen and examined with the above-signed Advanced Practice Provider and/or Housestaff. I personally reviewed laboratory data, imaging studies and relevant notes. I independently examined the patient and formulated the important aspects of the plan. I have edited the note to reflect any of my changes or salient points. I have personally discussed the plan with the patient and/or family.  [age]o woman with h/o HTN, HL, DM, CKD 3b, systolic HF EF  25-02-58%e to NICM (cath 2014 normal cors). Admitted with 2-day h/o sharp chest pain under her left breast. Worse with movement and lying down. No change with walking or other activities.  EGG and hstrop negative despite prolonged pain  General:  Well appearing. No resp difficulty HEENT: normal Neck: supple. no JVD. Carotids 2+ bilat; no bruits. No lymphadenopathy or thryomegaly appreciated. Cor: PMI nondisplaced. Regular rate & rhythm. No rubs, gallops or murmurs. Equisite point tenderness long rib under left breast Lungs: clear Abdomen: soft, nontender, nondistended. No hepatosplenomegaly. No bruits or masses. Good bowel sounds. Extremities: no cyanosis, clubbing, rash, edema Neuro: alert & orientedx3, cranial nerves grossly intact. moves all 4 extremities w/o difficulty. Affect pleasant  With atypical symptoms, reproducible pain on chest palpation and normal hstrop despite prolonged pain, I doubt that this is cardiac in nature. However given CRFs we discussed stress test vs medical therapy (not candidate for cath or CTA with CKD 3b). She wants to do  a stress test. Given CKD would only cath if very high risk.   Glori Bickers, MD  3:20 PM

## 2020-01-06 NOTE — Care Management Obs Status (Signed)
[place] NOTIFICATION   Patient Details  Name: Michelle Andrade MRN: [id number] Date of Birth: 1960/09/29   Medicare Observation Status Notification Given:  Yes    Zenon Mayo, RN 01/06/2020, 4:47 PM

## 2020-01-06 NOTE — Progress Notes (Signed)
Patient arrived to unit assisted to bed by nursing staff.No acute distress noted at present time.Patient denies pain or discomfort at this time. Oriented patient to nursing unit,call bell and phone.Patient call bell,phone and personal items within reach.

## 2020-01-07 ENCOUNTER — Observation Stay (HOSPITAL_BASED_OUTPATIENT_CLINIC_OR_DEPARTMENT_OTHER): Payer: Medicare Other

## 2020-01-07 DIAGNOSIS — Z95810 Presence of automatic (implantable) cardiac defibrillator: Secondary | ICD-10-CM | POA: Diagnosis not present

## 2020-01-07 DIAGNOSIS — I252 Old myocardial infarction: Secondary | ICD-10-CM | POA: Diagnosis not present

## 2020-01-07 DIAGNOSIS — I2510 Atherosclerotic heart disease of native coronary artery without angina pectoris: Secondary | ICD-10-CM | POA: Diagnosis present

## 2020-01-07 DIAGNOSIS — I259 Chronic ischemic heart disease, unspecified: Secondary | ICD-10-CM | POA: Diagnosis not present

## 2020-01-07 DIAGNOSIS — Z20822 Contact with and (suspected) exposure to covid-19: Secondary | ICD-10-CM | POA: Diagnosis present

## 2020-01-07 DIAGNOSIS — M109 Gout, unspecified: Secondary | ICD-10-CM | POA: Diagnosis present

## 2020-01-07 DIAGNOSIS — R0602 Shortness of breath: Secondary | ICD-10-CM | POA: Diagnosis present

## 2020-01-07 DIAGNOSIS — F329 Major depressive disorder, single episode, unspecified: Secondary | ICD-10-CM | POA: Diagnosis present

## 2020-01-07 DIAGNOSIS — E669 Obesity, unspecified: Secondary | ICD-10-CM | POA: Diagnosis present

## 2020-01-07 DIAGNOSIS — Z6831 Body mass index (BMI) 31.0-31.9, adult: Secondary | ICD-10-CM | POA: Diagnosis not present

## 2020-01-07 DIAGNOSIS — I428 Other cardiomyopathies: Secondary | ICD-10-CM | POA: Diagnosis present

## 2020-01-07 DIAGNOSIS — R072 Precordial pain: Secondary | ICD-10-CM | POA: Diagnosis not present

## 2020-01-07 DIAGNOSIS — Z90710 Acquired absence of both cervix and uterus: Secondary | ICD-10-CM | POA: Diagnosis not present

## 2020-01-07 DIAGNOSIS — Z885 Allergy status to narcotic agent status: Secondary | ICD-10-CM | POA: Diagnosis not present

## 2020-01-07 DIAGNOSIS — R0789 Other chest pain: Secondary | ICD-10-CM | POA: Diagnosis present

## 2020-01-07 DIAGNOSIS — I130 Hypertensive heart and chronic kidney disease with heart failure and stage 1 through stage 4 chronic kidney disease, or unspecified chronic kidney disease: Secondary | ICD-10-CM | POA: Diagnosis present

## 2020-01-07 DIAGNOSIS — R931 Abnormal findings on diagnostic imaging of heart and coronary circulation: Secondary | ICD-10-CM

## 2020-01-07 DIAGNOSIS — I5022 Chronic systolic (congestive) heart failure: Secondary | ICD-10-CM | POA: Diagnosis present

## 2020-01-07 DIAGNOSIS — I471 Supraventricular tachycardia: Secondary | ICD-10-CM | POA: Diagnosis present

## 2020-01-07 DIAGNOSIS — N1832 Chronic kidney disease, stage 3b: Secondary | ICD-10-CM | POA: Diagnosis present

## 2020-01-07 DIAGNOSIS — K589 Irritable bowel syndrome without diarrhea: Secondary | ICD-10-CM | POA: Diagnosis present

## 2020-01-07 DIAGNOSIS — R079 Chest pain, unspecified: Secondary | ICD-10-CM

## 2020-01-07 DIAGNOSIS — Z881 Allergy status to other antibiotic agents status: Secondary | ICD-10-CM | POA: Diagnosis not present

## 2020-01-07 DIAGNOSIS — E1122 Type 2 diabetes mellitus with diabetic chronic kidney disease: Secondary | ICD-10-CM | POA: Diagnosis present

## 2020-01-07 DIAGNOSIS — E1151 Type 2 diabetes mellitus with diabetic peripheral angiopathy without gangrene: Secondary | ICD-10-CM | POA: Diagnosis present

## 2020-01-07 DIAGNOSIS — R9439 Abnormal result of other cardiovascular function study: Secondary | ICD-10-CM | POA: Diagnosis present

## 2020-01-07 DIAGNOSIS — K219 Gastro-esophageal reflux disease without esophagitis: Secondary | ICD-10-CM | POA: Diagnosis present

## 2020-01-07 DIAGNOSIS — E7800 Pure hypercholesterolemia, unspecified: Secondary | ICD-10-CM | POA: Diagnosis present

## 2020-01-07 DIAGNOSIS — G4733 Obstructive sleep apnea (adult) (pediatric): Secondary | ICD-10-CM | POA: Diagnosis present

## 2020-01-07 LAB — CBC
HCT: 37.4 % (ref 36.0–46.0)
Hemoglobin: 12.4 g/dL (ref 12.0–15.0)
MCH: 30.8 pg (ref 26.0–34.0)
MCHC: 33.2 g/dL (ref 30.0–36.0)
MCV: 92.8 fL (ref 80.0–100.0)
Platelets: 227 10*3/uL (ref 150–400)
RBC: 4.03 MIL/uL (ref 3.87–5.11)
RDW: 16.6 % — ABNORMAL HIGH (ref 11.5–15.5)
WBC: 6.6 10*3/uL (ref 4.0–10.5)
nRBC: 0 % (ref 0.0–0.2)

## 2020-01-07 LAB — LIPID PANEL
Cholesterol: 149 mg/dL (ref 0–200)
HDL: 55 mg/dL (ref >40)
LDL Cholesterol: 77 mg/dL (ref 0–99)
Total CHOL/HDL Ratio: 2.7 RATIO
Triglycerides: 84 mg/dL (ref <150)
VLDL: 17 mg/dL (ref 0–40)

## 2020-01-07 LAB — GLUCOSE, CAPILLARY
Glucose-Capillary: 191 mg/dL — ABNORMAL HIGH (ref 70–99)
Glucose-Capillary: 263 mg/dL — ABNORMAL HIGH (ref 70–99)
Glucose-Capillary: 192 mg/dL — ABNORMAL HIGH (ref 70–99)

## 2020-01-07 LAB — BASIC METABOLIC PANEL
Anion gap: 10 (ref 5–15)
BUN: 25 mg/dL — ABNORMAL HIGH (ref 6–20)
CO2: 25 mmol/L (ref 22–32)
Calcium: 8.8 mg/dL — ABNORMAL LOW (ref 8.9–10.3)
Chloride: 102 mmol/L (ref 98–111)
Creatinine, Ser: 1.74 mg/dL — ABNORMAL HIGH (ref 0.44–1.00)
GFR calc Af Amer: 36 mL/min — ABNORMAL LOW (ref >60)
GFR calc non Af Amer: 31 mL/min — ABNORMAL LOW (ref >60)
Glucose, Bld: 171 mg/dL — ABNORMAL HIGH (ref 70–99)
Potassium: 3.1 mmol/L — ABNORMAL LOW (ref 3.5–5.1)
Sodium: 137 mmol/L (ref 135–145)

## 2020-01-07 LAB — NM MYOCAR MULTI W/SPECT W/WALL MOTION / EF
Estimated workload: 1 METS
Exercise duration (min): 5 min
Exercise duration (sec): 24 s
MPHR: 160 {beats}/min
Peak HR: 96 {beats}/min
Percent HR: 60 %
Rest HR: 63 {beats}/min

## 2020-01-07 LAB — HIV ANTIBODY (ROUTINE TESTING W REFLEX): HIV Screen 4th Generation wRfx: NONREACTIVE

## 2020-01-07 MED ORDER — TECHNETIUM TC 99M TETROFOSMIN IV KIT
0.9000 | PACK | Freq: Once | INTRAVENOUS | Status: AC | PRN
  Administered 2020-01-07: 10.9 via INTRAVENOUS

## 2020-01-07 MED ORDER — ASPIRIN 81 MG PO CHEW
81.0000 mg | CHEWABLE_TABLET | ORAL | Status: AC
  Administered 2020-01-08: 81 mg via ORAL
  Filled 2020-01-07: qty 1

## 2020-01-07 MED ORDER — TORSEMIDE 20 MG PO TABS
20.0000 mg | ORAL_TABLET | Freq: Two times a day (BID) | ORAL | Status: DC
  Administered 2020-01-08 – 2020-01-09 (×2): 20 mg via ORAL
  Filled 2020-01-07 (×2): qty 1

## 2020-01-07 MED ORDER — POTASSIUM CHLORIDE CRYS ER 20 MEQ PO TBCR
40.0000 meq | EXTENDED_RELEASE_TABLET | Freq: Once | ORAL | Status: AC
  Administered 2020-01-07: 40 meq via ORAL
  Filled 2020-01-07: qty 2

## 2020-01-07 MED ORDER — REGADENOSON 0.4 MG/5ML IV SOLN
0.4000 mg | Freq: Once | INTRAVENOUS | Status: AC
  Administered 2020-01-07: 0.4 mg via INTRAVENOUS

## 2020-01-07 MED ORDER — TECHNETIUM TC 99M TETROFOSMIN IV KIT
30.8000 | PACK | Freq: Once | INTRAVENOUS | Status: AC | PRN
  Administered 2020-01-07: 30.8 via INTRAVENOUS

## 2020-01-07 MED ORDER — SODIUM CHLORIDE 0.9% FLUSH: 3.0000 mL | INTRAVENOUS | Status: DC | PRN

## 2020-01-07 MED ORDER — SODIUM CHLORIDE 0.9 % IV SOLN: INTRAVENOUS | Status: DC

## 2020-01-07 MED ORDER — SODIUM CHLORIDE 0.9% FLUSH
3.0000 mL | Freq: Two times a day (BID) | INTRAVENOUS | Status: DC
  Administered 2020-01-07: 3 mL via INTRAVENOUS

## 2020-01-07 MED ORDER — REGADENOSON 0.4 MG/5ML IV SOLN
INTRAVENOUS | Status: AC
  Filled 2020-01-07: qty 5

## 2020-01-07 MED ORDER — SODIUM CHLORIDE 0.9 % IV SOLN: 250.0000 mL | INTRAVENOUS | Status: DC | PRN

## 2020-01-07 NOTE — Progress Notes (Signed)
PROGRESS NOTE    Michelle Andrade  QJF:[id number] DOB: 1960/09/11 DOA: 01/05/2020 PCP: Henderson Baltimore, [name]   Brief Narrative:  59 y.o. female with a past medical history significant for sCHF EF 35%, CAD, obesity, disability, OSA not on CPAP, and DM who presents with chest pain.  The patient was in her usual health the last 24 hours PTA.She had had some fleeting, sharp, moderate to severe left-sided chest pain, then on the day of admission, she noticed a left-sided dull discomfort that was present at rest, and did not go away.  It was nonpositional, worsened with food, worsened with deep breathing, or lifting things.  ED course:Afebrile, heart rate normal, respirations and blood pressure normal, pulse ox normal on room air. Initial ECG showed paced rhythm and high-sensitivity troponin was detectable but low and flat. Labs: Na 136, K 3.8, Cr 1.5 (baseline 2.2), WBC 2.9, Hgb 13.6. Chest x-ray was unremarkable Patient was admitted with serial troponin.  Cardiology was consulted.  Subjective: C/O Chest pain earlier-Had similar chest pain durind stress test Resting currently   Assessment & Plan:  Chest pain:Chest pain cardiac enzymes negative seen by cardiology order [place] MV 01/07/2020.  Aspirin, Lipitor, Coreg. Stress test came back +/high risk see report- discussed w/ Dr Jeffie Pollock and planning for cardiac cath in am. Pt reports she had elevated trop in past but did not have MI or stent  Systolic CHF/hypertension/status post BiV pacemaker/CAD:  LVEF 30-35% 11/2018. Cardio eval apprecaited.Continue asa, coreg, lipitor, torsemide, Imdur, hydralazine. No ACE/ARB/DRNI due to decreased renal function.  Type 2 diabetes mellitus:Poorly controlled hemoglobin A1c 8.6 on 01/06/2020.  On Trulicity currently on hold, on sliding scale insulin while here.  Obesity with BMI 30/OSA: Advised weight loss healthy lifestyle  CKD stage IIIa: Creatinine stable. Mood disorder on sertraline. Gout on  allopurinol.   DVT prophylaxis:SCD Code Status: full  Family Communication:Plan of care discussed with patient at bedside.  Status SL:[id number] as Observation. The patient will require care spanning >2 midnights and should be moved to inpatient because: Ongoing diagnostic testing needed not appropriate for outpatient work up and Inpatient level of care appropriate due to severity of illness Dispo:The patient is from: Home            Anticipated d/c is to: Home            Anticipated d/c date is: 1 day            Patient currently is not medically stable to d/c. Nutrition: Diet Order            Diet NPO time specified  Diet effective midnight        Diet heart healthy/carb modified Room service appropriate? Yes; Fluid consistency: Thin  Diet effective now              Body mass index is 30.77 kg/m.  Consultants:cardiology  Procedures:  The left ventricular ejection fraction is moderately decreased (30-44%). Ventricular paced rhythm.  Nuclear stress EF: 39%. There is septal wall hypokinesis.  Defect 1: There is a large defect of severe severity present in the basal inferior, basal inferolateral, mid inferior and apical inferior location.  Findings consistent with inferior/inferolateral ischemia and prior partial myocardial infarction with peri-infarct ischemia in this region as well. This is a high risk study.  Microbiology:see note  Medications: Scheduled Meds: . allopurinol  200 mg Oral Daily  . amiodarone  200 mg Oral Daily  . aspirin  81 mg Oral Daily  . atorvastatin  40 mg Oral QHS  . carvedilol  12.5 mg Oral BID WC  . hydrALAZINE  75 mg Oral TID  . insulin aspart  0-15 Units Subcutaneous TID WC  . insulin aspart  0-5 Units Subcutaneous QHS  . isosorbide mononitrate  60 mg Oral Daily  . regadenoson      . sertraline  150 mg Oral QHS  . sodium chloride flush  3 mL Intravenous Q12H  . [START ON 01/08/2020] torsemide  20 mg Oral BID   Continuous  Infusions:  Antimicrobials: Anti-infectives (From admission, onward)   None     Objective: Vitals: Today's Vitals   01/07/20 1323 01/07/20 1325 01/07/20 1328 01/07/20 1613  BP: 139/74 134/65 133/64 (!) 114/55  Pulse:    75  Resp:      Temp:    98 F (36.7 C)  TempSrc:    Oral  SpO2:    97%  Weight:      Height:      PainSc:       No intake or output data in the 24 hours ending 01/07/20 1716 Filed Weights   01/06/20 0225 01/06/20 0627 01/07/20 0352  Weight: 78.2 kg 78.2 kg 78.8 kg   Weight change: -1.91 kg   Intake/Output from previous day: 05/05 0701 - 05/06 0700 In: 360 [P.O.:360] Out: 500 [Urine:500] Intake/Output this shift: No intake/output data recorded.  Examination:  General exam: AAOx3 ,NAD, weak appearing. HEENT:Oral mucosa moist, Ear/Nose WNL grossly,dentition normal. Respiratory system: bilaterally clear,no wheezing or crackles,no use of accessory muscle, non tender. Cardiovascular system: S1 & S2 +, regular, No JVD. Gastrointestinal system: Abdomen soft, NT,ND, BS+. Nervous System:Alert, awake, moving extremities and grossly nonfocal Extremities: No edema, distal peripheral pulses palpable.  Skin: No rashes,no icterus. MSK: Normal muscle bulk,tone, power  Data Reviewed: I have personally reviewed following labs and imaging studies CBC: Recent Labs  Lab 01/05/20 1712 01/07/20 0347  WBC 6.9 6.6  NEUTROABS 4.1  --   HGB 13.6 12.4  HCT 41.0 37.4  MCV 92.1 92.8  PLT 255 944   Basic Metabolic Panel: Recent Labs  Lab 01/05/20 1712 01/07/20 0347  NA 136 137  K 3.8 3.1*  CL 100 102  CO2 24 25  GLUCOSE 199* 171*  BUN 16 25*  CREATININE 1.52* 1.74*  CALCIUM 9.0 8.8*   GFR: Estimated Creatinine Clearance: 34.2 mL/min (A) (by C-G formula based on SCr of 1.74 mg/dL (H)). Liver Function Tests: Recent Labs  Lab 01/05/20 1911  AST 69*  ALT 70*  ALKPHOS 128*  BILITOT 0.4  PROT 7.7  ALBUMIN 3.5   No results for input(s): LIPASE, AMYLASE  in the last 168 hours. No results for input(s): AMMONIA in the last 168 hours. Coagulation Profile: No results for input(s): INR, PROTIME in the last 168 hours. Cardiac Enzymes: No results for input(s): CKTOTAL, CKMB, CKMBINDEX, TROPONINI in the last 168 hours. BNP (last 3 results) No results for input(s): PROBNP in the last 8760 hours. HbA1C: Recent Labs    01/06/20 1138  HGBA1C 8.6*   CBG: Recent Labs  Lab 01/06/20 1153 01/06/20 1546 01/06/20 2107 01/07/20 0758 01/07/20 1636  GLUCAP 129* 116* 269* 191* 263*   Lipid Profile: Recent Labs    01/07/20 0347  CHOL 149  HDL 55  LDLCALC 77  TRIG 84  CHOLHDL 2.7   Thyroid Function Tests: Recent Labs    01/06/20 1138  TSH 2.230   Anemia Panel: No results for input(s): VITAMINB12, FOLATE, FERRITIN, TIBC, IRON, RETICCTPCT  in the last 72 hours. Sepsis Labs: No results for input(s): PROCALCITON, LATICACIDVEN in the last 168 hours.  Recent Results (from the past 240 hour(s))  Respiratory Panel by RT PCR (Flu A&B, Covid) - Nasopharyngeal Swab     Status: None   Collection Time: 01/05/20 11:16 PM   Specimen: Nasopharyngeal Swab  Result Value Ref Range Status   SARS Coronavirus 2 by RT PCR NEGATIVE NEGATIVE Final    Comment: (NOTE) SARS-CoV-2 target nucleic acids are NOT DETECTED. The SARS-CoV-2 RNA is generally detectable in upper respiratoy specimens during the acute phase of infection. The lowest concentration of SARS-CoV-2 viral copies this assay can detect is 131 copies/mL. A negative result does not preclude SARS-Cov-2 infection and should not be used as the sole basis for treatment or other patient management decisions. A negative result may occur with  improper specimen collection/handling, submission of specimen other than nasopharyngeal swab, presence of viral mutation(s) within the areas targeted by this assay, and inadequate number of viral copies (<131 copies/mL). A negative result must be combined with  clinical observations, patient history, and epidemiological information. The expected result is Negative. Fact Sheet for Patients:  PinkCheek.be Fact Sheet for Healthcare Providers:  GravelBags.it This test is not yet ap proved or cleared by the [place] FDA and  has been authorized for detection and/or diagnosis of SARS-CoV-2 by FDA under an Emergency Use Authorization (EUA). This EUA will remain  in effect (meaning this test can be used) for the duration of the COVID-19 declaration under Section 564(b)(1) of the Act, 21 U.S.C. section 360bbb-3(b)(1), unless the authorization is terminated or revoked sooner.    Influenza A by PCR NEGATIVE NEGATIVE Final   Influenza B by PCR NEGATIVE NEGATIVE Final    Comment: (NOTE) The Xpert Xpress SARS-CoV-2/FLU/RSV assay is intended as an aid in  the diagnosis of influenza from Nasopharyngeal swab specimens and  should not be used as a sole basis for treatment. Nasal washings and  aspirates are unacceptable for Xpert Xpress SARS-CoV-2/FLU/RSV  testing. Fact Sheet for Patients: PinkCheek.be Fact Sheet for Healthcare Providers: GravelBags.it This test is not yet approved or cleared by the [place] FDA and  has been authorized for detection and/or diagnosis of SARS-CoV-2 by  FDA under an Emergency Use Authorization (EUA). This EUA will remain  in effect (meaning this test can be used) for the duration of the  Covid-19 declaration under Section 564(b)(1) of the Act, 21  U.S.C. section 360bbb-3(b)(1), unless the authorization is  terminated or revoked. Performed at [facility], [address] [place], [place] [address]       Radiology Studies: NM Myocar Multi W/Spect Tamela Oddi Motion / EF  Result Date: 01/07/2020  The left ventricular ejection fraction is moderately decreased (30-44%). Ventricular paced  rhythm.  Nuclear stress EF: 39%. There is septal wall hypokinesis.  Defect 1: There is a large defect of severe severity present in the basal inferior, basal inferolateral, mid inferior and apical inferior location.  Findings consistent with inferior/inferolateral ischemia and prior partial myocardial infarction with peri-infarct ischemia in this region as well.  This is a high risk study.  Candee Furbish, MD  DG Chest Portable 1 View  Result Date: 01/05/2020 CLINICAL DATA:  Shortness of breath, nausea, palpitations EXAM: PORTABLE CHEST 1 VIEW COMPARISON:  10/06/2018 FINDINGS: Cardiomegaly with left chest multi lead pacer defibrillator. Both lungs are clear. Status post bilateral shoulder arthroplasty. IMPRESSION: Cardiomegaly without acute abnormality of the lungs. Electronically Signed  By: Eddie Candle M.D.   On: 01/05/2020 17:37     LOS: 0 days   Time spent: More than 50% of that time was spent in counseling and/or coordination of care.  Antonieta Pert, MD [facility]  01/07/2020, 5:16 PM

## 2020-01-07 NOTE — Progress Notes (Signed)
   Michelle Andrade presented for a nuclear stress test today.  No immediate complications.  Stress imaging is pending at this time.  Preliminary EKG findings may be listed in the chart, but the stress test result will not be finalized until perfusion imaging is complete.  1 day study, [facility] to read.  Rosaria Ferries, PA-C 01/07/2020, 2:21 PM

## 2020-01-07 NOTE — Telephone Encounter (Signed)
No response received from [facility]. Requested patient's transfer in [place].

## 2020-01-07 NOTE — Plan of Care (Signed)

## 2020-01-07 NOTE — Progress Notes (Addendum)
Progress Note  Patient Name: Michelle Andrade Date of Encounter: 01/07/2020  Primary Cardiologist: Glori Bickers, MD   Subjective   No SOB, still w/ chest wall tenderness  Inpatient Medications    Scheduled Meds: . allopurinol  200 mg Oral Daily  . amiodarone  200 mg Oral Daily  . aspirin  81 mg Oral Daily  . atorvastatin  40 mg Oral QHS  . carvedilol  12.5 mg Oral BID WC  . hydrALAZINE  75 mg Oral TID  . insulin aspart  0-15 Units Subcutaneous TID WC  . insulin aspart  0-5 Units Subcutaneous QHS  . isosorbide mononitrate  60 mg Oral Daily  . regadenoson      . sertraline  150 mg Oral QHS  . torsemide  20 mg Oral BID   Continuous Infusions:  PRN Meds: acetaminophen, ondansetron (ZOFRAN) IV   Vital Signs    Vitals:   01/07/20 1319 01/07/20 1323 01/07/20 1325 01/07/20 1328  BP: 136/72 139/74 134/65 133/64  Pulse:      Resp:      Temp:      TempSrc:      SpO2:      Weight:      Height:        Intake/Output Summary (Last 24 hours) at 01/07/2020 1331 Last data filed at 01/06/2020 1500 Gross per 24 hour  Intake 240 ml  Output --  Net 240 ml   Last 3 Weights 01/07/2020 01/06/2020 01/06/2020  Weight (lbs) 173 lb 11.2 oz 172 lb 4.8 oz 172 lb 4.8 oz  Weight (kg) 78.79 kg 78.155 kg 78.155 kg      Telemetry    SR, seen in nuc med - Personally Reviewed  ECG    SR, no acute changes - Personally Reviewed  Physical Exam   General:  Well appearing. No resp difficulty HEENT: normal Neck: supple. no JVD. Carotids 2+ bilat; no bruits. No lymphadenopathy or thryomegaly appreciated. Cor: PMI nondisplaced. Regular rate & rhythm. No rubs, gallops or murmurs.tender to palpation under left breast Lungs: clear Abdomen: soft, nontender, nondistended. No hepatosplenomegaly. No bruits or masses. Good bowel sounds. Extremities: no cyanosis, clubbing, rash, edema Neuro: alert & orientedx3, cranial nerves grossly intact. moves all 4 extremities w/o difficulty. Affect  pleasant   Labs    High Sensitivity Troponin:   Recent Labs  Lab 01/05/20 1910 01/05/20 2133 01/06/20 1053  TROPONINIHS 33* 34* 40*      Chemistry Recent Labs  Lab 01/05/20 1712 01/05/20 1911 01/07/20 0347  NA 136  --  137  K 3.8  --  3.1*  CL 100  --  102  CO2 24  --  25  GLUCOSE 199*  --  171*  BUN 16  --  25*  CREATININE 1.52*  --  1.74*  CALCIUM 9.0  --  8.8*  PROT  --  7.7  --   ALBUMIN  --  3.5  --   AST  --  69*  --   ALT  --  70*  --   ALKPHOS  --  128*  --   BILITOT  --  0.4  --   GFRNONAA 37*  --  31*  GFRAA 43*  --  36*  ANIONGAP 12  --  10     Hematology Recent Labs  Lab 01/05/20 1712 01/07/20 0347  WBC 6.9 6.6  RBC 4.45 4.03  HGB 13.6 12.4  HCT 41.0 37.4  MCV 92.1 92.8  MCH 30.6 30.8  MCHC 33.2 33.2  RDW 16.8* 16.6*  PLT 255 227    BNP Recent Labs  Lab 01/05/20 1712  BNP 113.7*     DDimer No results for input(s): DDIMER in the last 168 hours.   Radiology    DG Chest Portable 1 View  Result Date: 01/05/2020 CLINICAL DATA:  Shortness of breath, nausea, palpitations EXAM: PORTABLE CHEST 1 VIEW COMPARISON:  10/06/2018 FINDINGS: Cardiomegaly with left chest multi lead pacer defibrillator. Both lungs are clear. Status post bilateral shoulder arthroplasty. IMPRESSION: Cardiomegaly without acute abnormality of the lungs. Electronically Signed   By: Eddie Candle M.D.   On: 01/05/2020 17:37    Cardiac Studies   Echo 11/2018 1. The left ventricle has moderate-severely reduced systolic function,  with an ejection fraction of 30-35%. The cavity size was normal. Left  ventricular diastolic Doppler parameters are consistent with impaired  relaxation Elevated left ventricular  end-diastolic pressure Left ventricular diffuse hypokinesis.  2. The right ventricle has normal systolic function. The cavity was  normal. There is no increase in right ventricular wall thickness. Right  ventricular systolic pressure normal with an estimated pressure  of 26.7  mmHg.  3. Left atrial size was severely dilated.  4. The mitral valve is normal in structure. Mild thickening of the mitral  valve leaflet. Mild calcification of the anterior mitral valve leaflet.  5. The tricuspid valve is normal in structure.  6. The aortic valve is tricuspid Mild sclerosis of the aortic valve.  7. The pulmonic valve was normal in structure.    Right Heart Cardiac Cath 2018 Findings:  RA = 5 RV = 49/4 PA = 55/18 (35) PCW = 18 Fick cardiac output/index = 4.5/2.5 Thermo CO/CI = 4.5/2.5 PVR = 3.8 WU FA sat = 95% PA sat = 54%, 54%  Assessment: 1. Well-compensated filling pressures 2. Mildly reduced cardiac output  Plan/Discussion:  Continue current therapy. Possible home in am.    Patient Profile     59 y.o. female with a history of NICM with normal cors in 2014, thought to be related to HTN vs noncompliance vs tachy-mediated with history of atrial tach, CKD 3b, HFrEF w/ EF 20-25%, DM, HTN, HLD, STJ CRT-D.  The patient presented to the ED 01/05/20 for chest pain. The patient says she has intermittent sharp twinges of chest pain about once a week. 3 days ago she felt sharp chest pain on the left side underneath her left breast. It was 6/10 and patient felt associated nasuea and sob. The pain lingered on and went up into her neck. Pain was waxing and waning over the last few days. Walking did not seem to make it worse. When the pain persisted the patient decided to go to the ER for evaluation. She denies recent fever, chills, illness, LLE, abdominal bloating, weight gain.   Assessment & Plan    1. Chest pain - atypical sx - ez neg MI - for Lexiscan MV today  2. Chronic HFrEF - volume status good by exam - EF improved to 30-35% by echo 11/2018 - continue torsemide, Imdur, BB, hydralazine - no ACE/ARB/DRNI due to decreased renal function  3. S/p STJ CRT-D - 8 mo left on battery - no sig events  Otherwise, per IM  For questions  or updates, please contact [place] Please consult [email or web address] for contact info under       Signed, Rosaria Ferries, PA-C  01/07/2020, 1:31 PM     Patient seen and examined with the above-signed Advanced  Practice Provider and/or Housestaff. I personally reviewed laboratory data, imaging studies and relevant notes. I independently examined the patient and formulated the important aspects of the plan. I have edited the note to reflect any of my changes or salient points. I have personally discussed the plan with the patient and/or family.  Still sore in her chest. Myoview results done today reviewed personally and discussed with Dr. Marlou Porch. Shows several areas of ischemia/infarct concerning for multivessel CAD. EF 39%. I discussed the results with Michelle Andrade and although these could be artifact, I do think the areas at-possible risk are large enough to warrant definitive evaluation. Will hydrate overnight and plan coronary angiography in am. D/W w Dr. Maren Beach as well.   Glori Bickers, MD  10:58 PM

## 2020-01-08 ENCOUNTER — Encounter (HOSPITAL_COMMUNITY)
Admission: EM | Disposition: A | Discharge status: Home / Self Care | Payer: Self-pay | Source: Home / Self Care | Attending: Internal Medicine

## 2020-01-08 DIAGNOSIS — R9439 Abnormal result of other cardiovascular function study: Secondary | ICD-10-CM

## 2020-01-08 DIAGNOSIS — I2510 Atherosclerotic heart disease of native coronary artery without angina pectoris: Secondary | ICD-10-CM

## 2020-01-08 HISTORY — PX: INTRAVASCULAR PRESSURE WIRE/FFR STUDY: CATH118243

## 2020-01-08 HISTORY — PX: LEFT HEART CATH AND CORONARY ANGIOGRAPHY: CATH118249

## 2020-01-08 LAB — POCT ACTIVATED CLOTTING TIME: Activated Clotting Time: 263 seconds

## 2020-01-08 LAB — GLUCOSE, CAPILLARY
Glucose-Capillary: 180 mg/dL — ABNORMAL HIGH (ref 70–99)
Glucose-Capillary: 165 mg/dL — ABNORMAL HIGH (ref 70–99)
Glucose-Capillary: 304 mg/dL — ABNORMAL HIGH (ref 70–99)
Glucose-Capillary: 166 mg/dL — ABNORMAL HIGH (ref 70–99)

## 2020-01-08 LAB — BASIC METABOLIC PANEL
Anion gap: 8 (ref 5–15)
BUN: 26 mg/dL — ABNORMAL HIGH (ref 6–20)
CO2: 25 mmol/L (ref 22–32)
Calcium: 8.4 mg/dL — ABNORMAL LOW (ref 8.9–10.3)
Chloride: 106 mmol/L (ref 98–111)
Creatinine, Ser: 1.69 mg/dL — ABNORMAL HIGH (ref 0.44–1.00)
GFR calc Af Amer: 38 mL/min — ABNORMAL LOW (ref >60)
GFR calc non Af Amer: 32 mL/min — ABNORMAL LOW (ref >60)
Glucose, Bld: 212 mg/dL — ABNORMAL HIGH (ref 70–99)
Potassium: 3.7 mmol/L (ref 3.5–5.1)
Sodium: 139 mmol/L (ref 135–145)

## 2020-01-08 SURGERY — LEFT HEART CATH AND CORONARY ANGIOGRAPHY
Anesthesia: LOCAL

## 2020-01-08 MED ORDER — IOHEXOL 350 MG/ML SOLN
INTRAVENOUS | Status: DC | PRN
  Administered 2020-01-08: 30 mL via INTRA_ARTERIAL

## 2020-01-08 MED ORDER — HEPARIN SODIUM (PORCINE) 1000 UNIT/ML IJ SOLN
INTRAMUSCULAR | Status: DC | PRN
  Administered 2020-01-08: 3000 [IU] via INTRAVENOUS
  Administered 2020-01-08: 4000 [IU] via INTRAVENOUS

## 2020-01-08 MED ORDER — LABETALOL HCL 5 MG/ML IV SOLN: 10.0000 mg | INTRAVENOUS | Status: AC | PRN

## 2020-01-08 MED ORDER — HEPARIN (PORCINE) IN NACL 1000-0.9 UT/500ML-% IV SOLN
INTRAVENOUS | Status: DC | PRN
  Administered 2020-01-08 (×2): 500 mL

## 2020-01-08 MED ORDER — MIDAZOLAM HCL 2 MG/2ML IJ SOLN
INTRAMUSCULAR | Status: DC | PRN
  Administered 2020-01-08: 2 mg via INTRAVENOUS

## 2020-01-08 MED ORDER — LIDOCAINE HCL (PF) 1 % IJ SOLN
INTRAMUSCULAR | Status: AC
  Filled 2020-01-08: qty 30

## 2020-01-08 MED ORDER — MIDAZOLAM HCL 2 MG/2ML IJ SOLN
INTRAMUSCULAR | Status: AC
  Filled 2020-01-08: qty 2

## 2020-01-08 MED ORDER — SODIUM CHLORIDE 0.9 % IV SOLN: INTRAVENOUS | Status: AC

## 2020-01-08 MED ORDER — HYDRALAZINE HCL 20 MG/ML IJ SOLN: 10.0000 mg | INTRAMUSCULAR | Status: AC | PRN

## 2020-01-08 MED ORDER — LIDOCAINE HCL (PF) 1 % IJ SOLN
INTRAMUSCULAR | Status: DC | PRN
  Administered 2020-01-08 (×2): 2 mL via INTRADERMAL

## 2020-01-08 MED ORDER — MIDAZOLAM HCL 2 MG/2ML IJ SOLN
INTRAMUSCULAR | Status: DC | PRN
  Administered 2020-01-08: 1 mg via INTRAVENOUS

## 2020-01-08 MED ORDER — SODIUM CHLORIDE 0.9% FLUSH: 3.0000 mL | INTRAVENOUS | Status: DC | PRN

## 2020-01-08 MED ORDER — SODIUM CHLORIDE 0.9 % IV SOLN: 250.0000 mL | INTRAVENOUS | Status: DC | PRN

## 2020-01-08 MED ORDER — HEPARIN (PORCINE) IN NACL 1000-0.9 UT/500ML-% IV SOLN
INTRAVENOUS | Status: AC
  Filled 2020-01-08: qty 1000

## 2020-01-08 MED ORDER — HEPARIN SODIUM (PORCINE) 1000 UNIT/ML IJ SOLN
INTRAMUSCULAR | Status: AC
  Filled 2020-01-08: qty 1

## 2020-01-08 MED ORDER — ONDANSETRON HCL 4 MG/2ML IJ SOLN: 4.0000 mg | Freq: Four times a day (QID) | INTRAMUSCULAR | Status: DC | PRN

## 2020-01-08 MED ORDER — VERAPAMIL HCL 2.5 MG/ML IV SOLN
INTRAVENOUS | Status: AC
  Filled 2020-01-08: qty 2

## 2020-01-08 MED ORDER — ACETAMINOPHEN 325 MG PO TABS: 650.0000 mg | ORAL_TABLET | ORAL | Status: DC | PRN

## 2020-01-08 MED ORDER — VERAPAMIL HCL 2.5 MG/ML IV SOLN
INTRAVENOUS | Status: DC | PRN
  Administered 2020-01-08: 13:00:00 10 mL via INTRA_ARTERIAL

## 2020-01-08 MED ORDER — SODIUM CHLORIDE 0.9% FLUSH
3.0000 mL | Freq: Two times a day (BID) | INTRAVENOUS | Status: DC
  Administered 2020-01-08 – 2020-01-09 (×2): 3 mL via INTRAVENOUS

## 2020-01-08 SURGICAL SUPPLY — 13 items
CATH 5FR JL3.5 JR4 ANG PIG MP (CATHETERS) ×1 IMPLANT
CATH VISTA GUIDE 6FR AL1 (CATHETERS) ×1 IMPLANT
DEVICE RAD COMP TR BAND LRG (VASCULAR PRODUCTS) ×1 IMPLANT
GLIDESHEATH SLEND SS 6F .021 (SHEATH) ×1 IMPLANT
GUIDEWIRE INQWIRE 1.5J.035X260 (WIRE) IMPLANT
GUIDEWIRE PRESSURE X 175 (WIRE) ×1 IMPLANT
INQWIRE 1.5J .035X260CM (WIRE) ×4
KIT HEART LEFT (KITS) ×2 IMPLANT
KIT HEMO VALVE WATCHDOG (MISCELLANEOUS) ×1 IMPLANT
PACK CARDIAC CATHETERIZATION (CUSTOM PROCEDURE TRAY) ×2 IMPLANT
SHEATH PROBE COVER 6X72 (BAG) ×1 IMPLANT
TRANSDUCER W/STOPCOCK (MISCELLANEOUS) ×2 IMPLANT
TUBING CIL FLEX 10 FLL-RA (TUBING) ×2 IMPLANT

## 2020-01-08 NOTE — H&P (View-Only) (Signed)
Progress Note  Patient Name: Michelle Andrade Date of Encounter: 01/08/2020  Primary Cardiologist: Michelle Bickers, MD   Subjective   Chest wall still sore. No dyspnea. Creatinine improved with IVF.  Inpatient Medications    Scheduled Meds: . allopurinol  200 mg Oral Daily  . amiodarone  200 mg Oral Daily  . aspirin  81 mg Oral Daily  . atorvastatin  40 mg Oral QHS  . carvedilol  12.5 mg Oral BID WC  . hydrALAZINE  75 mg Oral TID  . insulin aspart  0-15 Units Subcutaneous TID WC  . insulin aspart  0-5 Units Subcutaneous QHS  . isosorbide mononitrate  60 mg Oral Daily  . sertraline  150 mg Oral QHS  . sodium chloride flush  3 mL Intravenous Q12H  . torsemide  20 mg Oral BID   Continuous Infusions: . sodium chloride    . sodium chloride 50 mL/hr at 01/08/20 0400   PRN Meds: sodium chloride, acetaminophen, ondansetron (ZOFRAN) IV, sodium chloride flush   Vital Signs    Vitals:   01/07/20 1613 01/07/20 2024 01/07/20 2216 01/08/20 0416  BP: (!) 114/55 96/73 (!) 98/49 (!) 119/56  Pulse: 75 78  64  Resp:  17  15  Temp: 98 F (36.7 C) 98.7 F (37.1 C)  98.5 F (36.9 C)  TempSrc: Oral Oral  Oral  SpO2: 97% 97%  97%  Weight:    79.3 kg  Height:        Intake/Output Summary (Last 24 hours) at 01/08/2020 0729 Last data filed at 01/08/2020 0400 Gross per 24 hour  Intake 379.97 ml  Output 350 ml  Net 29.97 ml   Last 3 Weights 01/08/2020 01/07/2020 01/06/2020  Weight (lbs) 174 lb 13.2 oz 173 lb 11.2 oz 172 lb 4.8 oz  Weight (kg) 79.3 kg 78.79 kg 78.155 kg      Telemetry    SR 60-70s Personally reviewed  Physical Exam   General:  Well appearing. No resp difficulty HEENT: normal Neck: supple. no JVD. Carotids 2+ bilat; no bruits. No lymphadenopathy or thryomegaly appreciated. Cor: PMI nondisplaced. Regular rate & rhythm. No rubs, gallops or murmurs. Mild chest wall tenderness under left breast Lungs: clear Abdomen: soft, nontender, nondistended. No hepatosplenomegaly.  No bruits or masses. Good bowel sounds. Extremities: no cyanosis, clubbing, rash, edema Neuro: alert & orientedx3, cranial nerves grossly intact. moves all 4 extremities w/o difficulty. Affect pleasant   Labs    High Sensitivity Troponin:   Recent Labs  Lab 01/05/20 1910 01/05/20 2133 01/06/20 1053  TROPONINIHS 33* 34* 40*      Chemistry Recent Labs  Lab 01/05/20 1712 01/05/20 1911 01/07/20 0347 01/08/20 0355  NA 136  --  137 139  K 3.8  --  3.1* 3.7  CL 100  --  102 106  CO2 24  --  25 25  GLUCOSE 199*  --  171* 212*  BUN 16  --  25* 26*  CREATININE 1.52*  --  1.74* 1.69*  CALCIUM 9.0  --  8.8* 8.4*  PROT  --  7.7  --   --   ALBUMIN  --  3.5  --   --   AST  --  69*  --   --   ALT  --  70*  --   --   ALKPHOS  --  128*  --   --   BILITOT  --  0.4  --   --   GFRNONAA 37*  --  31* 32*  GFRAA 43*  --  36* 38*  ANIONGAP 12  --  10 8     Hematology Recent Labs  Lab 01/05/20 1712 01/07/20 0347  WBC 6.9 6.6  RBC 4.45 4.03  HGB 13.6 12.4  HCT 41.0 37.4  MCV 92.1 92.8  MCH 30.6 30.8  MCHC 33.2 33.2  RDW 16.8* 16.6*  PLT 255 227    BNP Recent Labs  Lab 01/05/20 1712  BNP 113.7*     DDimer No results for input(s): DDIMER in the last 168 hours.   Radiology    NM Myocar Multi W/Spect W/Wall Motion / EF  Result Date: 01/07/2020  The left ventricular ejection fraction is moderately decreased (30-44%). Ventricular paced rhythm.  Nuclear stress EF: 39%. There is septal wall hypokinesis.  Defect 1: There is a large defect of severe severity present in the basal inferior, basal inferolateral, mid inferior and apical inferior location.  Findings consistent with inferior/inferolateral ischemia and prior partial myocardial infarction with peri-infarct ischemia in this region as well.  This is a high risk study.  Michelle Furbish, MD   Cardiac Studies   Echo 11/2018 1. The left ventricle has moderate-severely reduced systolic function,  with an ejection fraction of  30-35%. The cavity size was normal. Left  ventricular diastolic Doppler parameters are consistent with impaired  relaxation Elevated left ventricular  end-diastolic pressure Left ventricular diffuse hypokinesis.  2. The right ventricle has normal systolic function. The cavity was  normal. There is no increase in right ventricular wall thickness. Right  ventricular systolic pressure normal with an estimated pressure of 26.7  mmHg.  3. Left atrial size was severely dilated.  4. The mitral valve is normal in structure. Mild thickening of the mitral  valve leaflet. Mild calcification of the anterior mitral valve leaflet.  5. The tricuspid valve is normal in structure.  6. The aortic valve is tricuspid Mild sclerosis of the aortic valve.  7. The pulmonic valve was normal in structure.    Right Heart Cardiac Cath 2018 Findings:  RA = 5 RV = 49/4 PA = 55/18 (35) PCW = 18 Fick cardiac output/index = 4.5/2.5 Thermo CO/CI = 4.5/2.5 PVR = 3.8 WU FA sat = 95% PA sat = 54%, 54%  Assessment: 1. Well-compensated filling pressures 2. Mildly reduced cardiac output  Plan/Discussion:  Continue current therapy. Possible home in am.    Patient Profile     59 y.o. female with a history of NICM with normal cors in 2014, thought to be related to HTN vs noncompliance vs tachy-mediated with history of atrial tach, CKD 3b, HFrEF w/ EF 20-25%, DM, HTN, HLD, STJ CRT-D.  The patient presented to the ED 01/05/20 for chest pain. The patient says she has intermittent sharp twinges of chest pain about once a week. 3 days ago she felt sharp chest pain on the left side underneath her left breast. It was 6/10 and patient felt associated nasuea and sob. The pain lingered on and went up into her neck. Pain was waxing and waning over the last few days. Walking did not seem to make it worse. When the pain persisted the patient decided to go to the ER for evaluation. She denies recent fever, chills,  illness, LLE, abdominal bloating, weight gain.   Assessment & Plan    1. Chest pain - mixed atypical/typical CP -  Myoview results done today reviewed personally and discussed with Dr. Marlou Andrade. Shows several areas of ischemia/infarct concerning for multivessel CAD.  EF 39%. I discussed the results with Michelle Andrade yesterday and although these could be artifact, I do think the areas at-possible risk are large enough to warrant definitive evaluation.We have hydrated her overnight and creatinine some better. Will plan angiography over lunch  2. Chronic HFrEF - volume status stable on exam - EF improved to 30-35% by echo 11/2018 - EF 39% by Myoview - continue torsemide, Imdur, BB, hydralazine - no ACE/ARB/DRNI due to decreased renal function  3. S/p STJ CRT-D - 8 mo left on battery - no sig events - stable  For questions or updates, please contact [place] Please consult [email or web address] for contact info under       Signed, Michelle Bickers, MD  01/08/2020, 7:29 AM

## 2020-01-08 NOTE — Progress Notes (Signed)
PROGRESS NOTE    Michelle Andrade  MVH:[id number] DOB: 27-Oct-1960 DOA: 01/05/2020 PCP: Henderson Baltimore, [name]   Brief Narrative:  59 y.o. female with a past medical history significant for sCHF EF 35%, CAD, obesity, disability, OSA not on CPAP, and DM who presents with chest pain. The patient was in her usual health the last 24 hours PTA.She had had some fleeting, sharp, moderate to severe left-sided chest pain, then on the day of admission, she noticed a left-sided dull discomfort that was present at rest, and did not go away.  It was nonpositional, worsened with food, worsened with deep breathing, or lifting things.  ED course:Afebrile, heart rate normal, respirations and blood pressure normal, pulse ox normal on room air. Initial ECG showed paced rhythm and high-sensitivity troponin was detectable but low and flat. Labs: Na 136, K 3.8, Cr 1.5 (baseline 2.2), WBC 2.9, Hgb 13.6. Chest x-ray was unremarkable Patient was admitted with serial troponin.  Cardiology was consulted. Seen by cardiology patient underwent further evaluation with nuclear stress test that came back abnormal and cardiology plan for cardiac cath 5/7.  Subjective: Seen this am "little tweag" in her left chest.  Awaiting for cardiac cath   Assessment & Plan:  Chest pain:Chest pain cardiac enzymes negative seen by cardiology order [facility] 01/07/2020.  Cont on Aspirin, Lipitor, Coreg. Stress test came back +/high risk see report- discussed w/ Dr Jeffie Pollock and for cardiac cath  01/08/20. She reports she had elevated trop in past but did not have MI or stents.  Chronic Systolic CHF/hypertension/status post BiV pacemaker/CAD:  LVEF 30-35% 11/2018. Cardio eval apprecaited.Continue asa, coreg, lipitor, torsemide, Imdur, hydralazine. No ACE/ARB/DRNI due to decreased renal function.  Type 2 diabetes mellitus:Poorly controlled hemoglobin A1c 8.6 on 01/06/2020.  On Trulicity currently on hold, cont sliding scale insulin while here.  Recent Labs  Lab 01/07/20 0758 01/07/20 1636 01/07/20 2148 01/08/20 0813 01/08/20 1130  GLUCAP 191* 263* 192* 180* 165*   Obesity with BMI 30/OSA: Advised weight loss healthy lifestyle  CKD stage IIIa: Creatinine stable. Monitor. Hydration for cath. Bmp in am post cath. Recent Labs  Lab 01/05/20 1712 01/07/20 0347 01/08/20 0355  BUN 16 25* 26*  CREATININE 1.52* 1.74* 1.69*   Mood disorder on sertraline. Gout on allopurinol.  DVT prophylaxis:SCD Code Status: full  Family Communication:Plan of care discussed with patient at bedside.  Status is: Inpatient Remains inpatient appropriate because:Ongoing diagnostic testing needed not appropriate for outpatient work up, Inpatient level of care appropriate due to severity of illness and for renal function monitoring   Dispo:The patient is from: Home            Anticipated d/c is to: Home            Anticipated d/c date is: 1 day            Patient currently is not medically stable to d/c. home once okay w/ cardiology. Nutrition: Diet Order            Diet heart healthy/carb modified Room service appropriate? Yes; Fluid consistency: Thin  Diet effective now              Body mass index is 30.97 kg/m.  Consultants:cardiology  Procedures:  The left ventricular ejection fraction is moderately decreased (30-44%). Ventricular paced rhythm.  Nuclear stress EF: 39%. There is septal wall hypokinesis.  Defect 1: There is a large defect of severe severity present in the basal inferior, basal inferolateral, mid inferior and apical inferior  location.  Findings consistent with inferior/inferolateral ischemia and prior partial myocardial infarction with peri-infarct ischemia in this region as well. This is a high risk study.  Microbiology:see note  Medications: Scheduled Meds: . allopurinol  200 mg Oral Daily  . amiodarone  200 mg Oral Daily  . aspirin  81 mg Oral Daily  . atorvastatin  40 mg Oral QHS  . carvedilol  12.5  mg Oral BID WC  . hydrALAZINE  75 mg Oral TID  . insulin aspart  0-15 Units Subcutaneous TID WC  . insulin aspart  0-5 Units Subcutaneous QHS  . isosorbide mononitrate  60 mg Oral Daily  . sertraline  150 mg Oral QHS  . sodium chloride flush  3 mL Intravenous Q12H  . sodium chloride flush  3 mL Intravenous Q12H  . torsemide  20 mg Oral BID   Continuous Infusions: . sodium chloride    . sodium chloride      Antimicrobials: Anti-infectives (From admission, onward)   None     Objective: Vitals: Today's Vitals   01/08/20 1346 01/08/20 1351 01/08/20 1353 01/08/20 1358  BP: (!) 142/77 (!) 144/75    Pulse: 66 67    Resp: 16 15    Temp:      TempSrc:      SpO2: 97% 99%    Weight:      Height:      PainSc: 0-No pain  0-No pain 0-No pain    Intake/Output Summary (Last 24 hours) at 01/08/2020 1446 Last data filed at 01/08/2020 0400 Gross per 24 hour  Intake 379.97 ml  Output 350 ml  Net 29.97 ml   Filed Weights   01/06/20 0627 01/07/20 0352 01/08/20 0416  Weight: 78.2 kg 78.8 kg 79.3 kg   Weight change: 0.51 kg   Intake/Output from previous day: 05/06 0701 - 05/07 0700 In: 380 [I.V.:380] Out: 350 [Urine:350] Intake/Output this shift: No intake/output data recorded.  Examination:  General exam: AAOx3 ,NAD, on RA. HEENT:Oral mucosa moist, Ear/Nose WNL grossly,dentition normal. Respiratory system: bilaterally clear,no wheezing or crackles,no use of accessory muscle, non tender. Mild tenderness on left chest. Cardiovascular system: S1 & S2 +, regular, No JVD. Gastrointestinal system: Abdomen soft, NT,ND, BS+. Nervous System:Alert, awake, moving extremities and grossly nonfocal Extremities: No edema, distal peripheral pulses palpable.  Skin: No rashes,no icterus. MSK: Normal muscle bulk,tone, power  Data Reviewed: I have personally reviewed following labs and imaging studies CBC: Recent Labs  Lab 01/05/20 1712 01/07/20 0347  WBC 6.9 6.6  NEUTROABS 4.1  --   HGB  13.6 12.4  HCT 41.0 37.4  MCV 92.1 92.8  PLT 255 948   Basic Metabolic Panel: Recent Labs  Lab 01/05/20 1712 01/07/20 0347 01/08/20 0355  NA 136 137 139  K 3.8 3.1* 3.7  CL 100 102 106  CO2 24 25 25   GLUCOSE 199* 171* 212*  BUN 16 25* 26*  CREATININE 1.52* 1.74* 1.69*  CALCIUM 9.0 8.8* 8.4*   GFR: Estimated Creatinine Clearance: 35.3 mL/min (A) (by C-G formula based on SCr of 1.69 mg/dL (H)). Liver Function Tests: Recent Labs  Lab 01/05/20 1911  AST 69*  ALT 70*  ALKPHOS 128*  BILITOT 0.4  PROT 7.7  ALBUMIN 3.5   No results for input(s): LIPASE, AMYLASE in the last 168 hours. No results for input(s): AMMONIA in the last 168 hours. Coagulation Profile: No results for input(s): INR, PROTIME in the last 168 hours. Cardiac Enzymes: No results for input(s): CKTOTAL, CKMB, CKMBINDEX,  TROPONINI in the last 168 hours. BNP (last 3 results) No results for input(s): PROBNP in the last 8760 hours. HbA1C: Recent Labs    01/06/20 1138  HGBA1C 8.6*   CBG: Recent Labs  Lab 01/07/20 0758 01/07/20 1636 01/07/20 2148 01/08/20 0813 01/08/20 1130  GLUCAP 191* 263* 192* 180* 165*   Lipid Profile: Recent Labs    01/07/20 0347  CHOL 149  HDL 55  LDLCALC 77  TRIG 84  CHOLHDL 2.7   Thyroid Function Tests: Recent Labs    01/06/20 1138  TSH 2.230   Anemia Panel: No results for input(s): VITAMINB12, FOLATE, FERRITIN, TIBC, IRON, RETICCTPCT in the last 72 hours. Sepsis Labs: No results for input(s): PROCALCITON, LATICACIDVEN in the last 168 hours.  Recent Results (from the past 240 hour(s))  Respiratory Panel by RT PCR (Flu A&B, Covid) - Nasopharyngeal Swab     Status: None   Collection Time: 01/05/20 11:16 PM   Specimen: Nasopharyngeal Swab  Result Value Ref Range Status   SARS Coronavirus 2 by RT PCR NEGATIVE NEGATIVE Final    Comment: (NOTE) SARS-CoV-2 target nucleic acids are NOT DETECTED. The SARS-CoV-2 RNA is generally detectable in upper respiratoy  specimens during the acute phase of infection. The lowest concentration of SARS-CoV-2 viral copies this assay can detect is 131 copies/mL. A negative result does not preclude SARS-Cov-2 infection and should not be used as the sole basis for treatment or other patient management decisions. A negative result may occur with  improper specimen collection/handling, submission of specimen other than nasopharyngeal swab, presence of viral mutation(s) within the areas targeted by this assay, and inadequate number of viral copies (<131 copies/mL). A negative result must be combined with clinical observations, patient history, and epidemiological information. The expected result is Negative. Fact Sheet for Patients:  PinkCheek.be Fact Sheet for Healthcare Providers:  GravelBags.it This test is not yet ap proved or cleared by the [place] FDA and  has been authorized for detection and/or diagnosis of SARS-CoV-2 by FDA under an Emergency Use Authorization (EUA). This EUA will remain  in effect (meaning this test can be used) for the duration of the COVID-19 declaration under Section 564(b)(1) of the Act, 21 U.S.C. section 360bbb-3(b)(1), unless the authorization is terminated or revoked sooner.    Influenza A by PCR NEGATIVE NEGATIVE Final   Influenza B by PCR NEGATIVE NEGATIVE Final    Comment: (NOTE) The Xpert Xpress SARS-CoV-2/FLU/RSV assay is intended as an aid in  the diagnosis of influenza from Nasopharyngeal swab specimens and  should not be used as a sole basis for treatment. Nasal washings and  aspirates are unacceptable for Xpert Xpress SARS-CoV-2/FLU/RSV  testing. Fact Sheet for Patients: PinkCheek.be Fact Sheet for Healthcare Providers: GravelBags.it This test is not yet approved or cleared by the [place] FDA and  has been authorized for detection and/or  diagnosis of SARS-CoV-2 by  FDA under an Emergency Use Authorization (EUA). This EUA will remain  in effect (meaning this test can be used) for the duration of the  Covid-19 declaration under Section 564(b)(1) of the Act, 21  U.S.C. section 360bbb-3(b)(1), unless the authorization is  terminated or revoked. Performed at [facility], [address], [place], [place] [address]       Radiology Studies: CARDIAC CATHETERIZATION  Result Date: 01/08/2020  Mid RCA lesion is 50% stenosed. RFR 1.0. Not significant.  Continue aggressive medical therapy for heart failure team.   CARDIAC CATHETERIZATION  Result Date: 01/08/2020  Ost RCA to Prox RCA lesion is 20% stenosed.  Prox RCA to Mid RCA lesion is 60% stenosed.  Mid Cx lesion is 30% stenosed.  Mid LAD lesion is 20% stenosed.  Findings: 1. Borderline mRCA lesion ~60% 2. Minimal non-obstructive CAD 3. EF 35-40% with LVEDP = 14 Assessment/Plan: FFR with RCA. Glori Bickers, MD 1:40 PM   NM Myocar Multi W/Spect Tamela Oddi Motion / EF  Result Date: 01/07/2020  The left ventricular ejection fraction is moderately decreased (30-44%). Ventricular paced rhythm.  Nuclear stress EF: 39%. There is septal wall hypokinesis.  Defect 1: There is a large defect of severe severity present in the basal inferior, basal inferolateral, mid inferior and apical inferior location.  Findings consistent with inferior/inferolateral ischemia and prior partial myocardial infarction with peri-infarct ischemia in this region as well.  This is a high risk study.  Candee Furbish, MD    LOS: 1 day   Time spent: More than 50% of that time was spent in counseling and/or coordination of care.  Antonieta Pert, MD [facility]  01/08/2020, 2:46 PM

## 2020-01-08 NOTE — Interval H&P Note (Signed)
History and Physical Interval Note:  01/08/2020 7:34 AM  Michelle Andrade  has presented today for surgery, with the diagnosis of Abnormal nuclear stress test.  The various methods of treatment have been discussed with the patient and family. After consideration of risks, benefits and other options for treatment, the patient has consented to  Procedure(s): LEFT HEART CATH AND CORONARY ANGIOGRAPHY (N/A) and possible coronary angiography as a surgical intervention.  The patient's history has been reviewed, patient examined, no change in status, stable for surgery.  I have reviewed the patient's chart and labs.  Questions were answered to the patient's satisfaction.     Gera Inboden

## 2020-01-08 NOTE — Progress Notes (Signed)
Progress Note  Patient Name: Michelle Andrade Date of Encounter: 01/08/2020  Primary Cardiologist: Glori Bickers, MD   Subjective   Chest wall still sore. No dyspnea. Creatinine improved with IVF.  Inpatient Medications    Scheduled Meds: . allopurinol  200 mg Oral Daily  . amiodarone  200 mg Oral Daily  . aspirin  81 mg Oral Daily  . atorvastatin  40 mg Oral QHS  . carvedilol  12.5 mg Oral BID WC  . hydrALAZINE  75 mg Oral TID  . insulin aspart  0-15 Units Subcutaneous TID WC  . insulin aspart  0-5 Units Subcutaneous QHS  . isosorbide mononitrate  60 mg Oral Daily  . sertraline  150 mg Oral QHS  . sodium chloride flush  3 mL Intravenous Q12H  . torsemide  20 mg Oral BID   Continuous Infusions: . sodium chloride    . sodium chloride 50 mL/hr at 01/08/20 0400   PRN Meds: sodium chloride, acetaminophen, ondansetron (ZOFRAN) IV, sodium chloride flush   Vital Signs    Vitals:   01/07/20 1613 01/07/20 2024 01/07/20 2216 01/08/20 0416  BP: (!) 114/55 96/73 (!) 98/49 (!) 119/56  Pulse: 75 78  64  Resp:  17  15  Temp: 98 F (36.7 C) 98.7 F (37.1 C)  98.5 F (36.9 C)  TempSrc: Oral Oral  Oral  SpO2: 97% 97%  97%  Weight:    79.3 kg  Height:        Intake/Output Summary (Last 24 hours) at 01/08/2020 0729 Last data filed at 01/08/2020 0400 Gross per 24 hour  Intake 379.97 ml  Output 350 ml  Net 29.97 ml   Last 3 Weights 01/08/2020 01/07/2020 01/06/2020  Weight (lbs) 174 lb 13.2 oz 173 lb 11.2 oz 172 lb 4.8 oz  Weight (kg) 79.3 kg 78.79 kg 78.155 kg      Telemetry    SR 60-70s Personally reviewed  Physical Exam   General:  Well appearing. No resp difficulty HEENT: normal Neck: supple. no JVD. Carotids 2+ bilat; no bruits. No lymphadenopathy or thryomegaly appreciated. Cor: PMI nondisplaced. Regular rate & rhythm. No rubs, gallops or murmurs. Mild chest wall tenderness under left breast Lungs: clear Abdomen: soft, nontender, nondistended. No hepatosplenomegaly.  No bruits or masses. Good bowel sounds. Extremities: no cyanosis, clubbing, rash, edema Neuro: alert & orientedx3, cranial nerves grossly intact. moves all 4 extremities w/o difficulty. Affect pleasant   Labs    High Sensitivity Troponin:   Recent Labs  Lab 01/05/20 1910 01/05/20 2133 01/06/20 1053  TROPONINIHS 33* 34* 40*      Chemistry Recent Labs  Lab 01/05/20 1712 01/05/20 1911 01/07/20 0347 01/08/20 0355  NA 136  --  137 139  K 3.8  --  3.1* 3.7  CL 100  --  102 106  CO2 24  --  25 25  GLUCOSE 199*  --  171* 212*  BUN 16  --  25* 26*  CREATININE 1.52*  --  1.74* 1.69*  CALCIUM 9.0  --  8.8* 8.4*  PROT  --  7.7  --   --   ALBUMIN  --  3.5  --   --   AST  --  69*  --   --   ALT  --  70*  --   --   ALKPHOS  --  128*  --   --   BILITOT  --  0.4  --   --   GFRNONAA 37*  --  31* 32*  GFRAA 43*  --  36* 38*  ANIONGAP 12  --  10 8     Hematology Recent Labs  Lab 01/05/20 1712 01/07/20 0347  WBC 6.9 6.6  RBC 4.45 4.03  HGB 13.6 12.4  HCT 41.0 37.4  MCV 92.1 92.8  MCH 30.6 30.8  MCHC 33.2 33.2  RDW 16.8* 16.6*  PLT 255 227    BNP Recent Labs  Lab 01/05/20 1712  BNP 113.7*     DDimer No results for input(s): DDIMER in the last 168 hours.   Radiology    NM Myocar Multi W/Spect W/Wall Motion / EF  Result Date: 01/07/2020  The left ventricular ejection fraction is moderately decreased (30-44%). Ventricular paced rhythm.  Nuclear stress EF: 39%. There is septal wall hypokinesis.  Defect 1: There is a large defect of severe severity present in the basal inferior, basal inferolateral, mid inferior and apical inferior location.  Findings consistent with inferior/inferolateral ischemia and prior partial myocardial infarction with peri-infarct ischemia in this region as well.  This is a high risk study.  Candee Furbish, MD   Cardiac Studies   Echo 11/2018 1. The left ventricle has moderate-severely reduced systolic function,  with an ejection fraction of  30-35%. The cavity size was normal. Left  ventricular diastolic Doppler parameters are consistent with impaired  relaxation Elevated left ventricular  end-diastolic pressure Left ventricular diffuse hypokinesis.  2. The right ventricle has normal systolic function. The cavity was  normal. There is no increase in right ventricular wall thickness. Right  ventricular systolic pressure normal with an estimated pressure of 26.7  mmHg.  3. Left atrial size was severely dilated.  4. The mitral valve is normal in structure. Mild thickening of the mitral  valve leaflet. Mild calcification of the anterior mitral valve leaflet.  5. The tricuspid valve is normal in structure.  6. The aortic valve is tricuspid Mild sclerosis of the aortic valve.  7. The pulmonic valve was normal in structure.    Right Heart Cardiac Cath 2018 Findings:  RA = 5 RV = 49/4 PA = 55/18 (35) PCW = 18 Fick cardiac output/index = 4.5/2.5 Thermo CO/CI = 4.5/2.5 PVR = 3.8 WU FA sat = 95% PA sat = 54%, 54%  Assessment: 1. Well-compensated filling pressures 2. Mildly reduced cardiac output  Plan/Discussion:  Continue current therapy. Possible home in am.    Patient Profile     59 y.o. female with a history of NICM with normal cors in 2014, thought to be related to HTN vs noncompliance vs tachy-mediated with history of atrial tach, CKD 3b, HFrEF w/ EF 20-25%, DM, HTN, HLD, STJ CRT-D.  The patient presented to the ED 01/05/20 for chest pain. The patient says she has intermittent sharp twinges of chest pain about once a week. 3 days ago she felt sharp chest pain on the left side underneath her left breast. It was 6/10 and patient felt associated nasuea and sob. The pain lingered on and went up into her neck. Pain was waxing and waning over the last few days. Walking did not seem to make it worse. When the pain persisted the patient decided to go to the ER for evaluation. She denies recent fever, chills,  illness, LLE, abdominal bloating, weight gain.   Assessment & Plan    1. Chest pain - mixed atypical/typical CP -  Myoview results done today reviewed personally and discussed with Dr. Marlou Porch. Shows several areas of ischemia/infarct concerning for multivessel CAD.  EF 39%. I discussed the results with [name] yesterday and although these could be artifact, I do think the areas at-possible risk are large enough to warrant definitive evaluation.We have hydrated her overnight and creatinine some better. Will plan angiography over lunch  2. Chronic HFrEF - volume status stable on exam - EF improved to 30-35% by echo 11/2018 - EF 39% by Myoview - continue torsemide, Imdur, BB, hydralazine - no ACE/ARB/DRNI due to decreased renal function  3. S/p STJ CRT-D - 8 mo left on battery - no sig events - stable  For questions or updates, please contact [place] Please consult [email or web address] for contact info under       Signed, Glori Bickers, MD  01/08/2020, 7:29 AM

## 2020-01-09 DIAGNOSIS — I259 Chronic ischemic heart disease, unspecified: Secondary | ICD-10-CM

## 2020-01-09 DIAGNOSIS — I5022 Chronic systolic (congestive) heart failure: Secondary | ICD-10-CM

## 2020-01-09 DIAGNOSIS — R9439 Abnormal result of other cardiovascular function study: Secondary | ICD-10-CM

## 2020-01-09 LAB — GLUCOSE, CAPILLARY: Glucose-Capillary: 133 mg/dL — ABNORMAL HIGH (ref 70–99)

## 2020-01-09 LAB — BASIC METABOLIC PANEL
Anion gap: 13 (ref 5–15)
BUN: 18 mg/dL (ref 6–20)
CO2: 20 mmol/L — ABNORMAL LOW (ref 22–32)
Calcium: 8.7 mg/dL — ABNORMAL LOW (ref 8.9–10.3)
Chloride: 104 mmol/L (ref 98–111)
Creatinine, Ser: 1.47 mg/dL — ABNORMAL HIGH (ref 0.44–1.00)
GFR calc Af Amer: 45 mL/min — ABNORMAL LOW (ref >60)
GFR calc non Af Amer: 38 mL/min — ABNORMAL LOW (ref >60)
Glucose, Bld: 270 mg/dL — ABNORMAL HIGH (ref 70–99)
Potassium: 3.3 mmol/L — ABNORMAL LOW (ref 3.5–5.1)
Sodium: 137 mmol/L (ref 135–145)

## 2020-01-09 MED ORDER — POTASSIUM CHLORIDE CRYS ER 20 MEQ PO TBCR
40.0000 meq | EXTENDED_RELEASE_TABLET | Freq: Once | ORAL | Status: AC
  Administered 2020-01-09: 40 meq via ORAL
  Filled 2020-01-09: qty 2

## 2020-01-09 NOTE — Progress Notes (Signed)
Progress Note  Patient Name: Michelle Andrade Date of Encounter: 01/09/2020  Primary Cardiologist: Glori Bickers, MD   Subjective   No CP this am.   Cath yesterday with RCA 50-60%. FFR normal.   R wrist sore at cath site. No labs this am.   Inpatient Medications    Scheduled Meds: . allopurinol  200 mg Oral Daily  . amiodarone  200 mg Oral Daily  . aspirin  81 mg Oral Daily  . atorvastatin  40 mg Oral QHS  . carvedilol  12.5 mg Oral BID WC  . hydrALAZINE  75 mg Oral TID  . insulin aspart  0-15 Units Subcutaneous TID WC  . insulin aspart  0-5 Units Subcutaneous QHS  . isosorbide mononitrate  60 mg Oral Daily  . sertraline  150 mg Oral QHS  . sodium chloride flush  3 mL Intravenous Q12H  . sodium chloride flush  3 mL Intravenous Q12H  . torsemide  20 mg Oral BID   Continuous Infusions: . sodium chloride     PRN Meds: sodium chloride, acetaminophen, ondansetron (ZOFRAN) IV, sodium chloride flush   Vital Signs    Vitals:   01/08/20 1735 01/08/20 2023 01/09/20 0441 01/09/20 0812  BP: (!) 104/55 112/65 (!) 141/68 (!) 143/71  Pulse: 66 65 71   Resp:  20 19   Temp:  98 F (36.7 C) 98.4 F (36.9 C)   TempSrc:  Oral Oral   SpO2: 98% 99% 100%   Weight:   80 kg   Height:        Intake/Output Summary (Last 24 hours) at 01/09/2020 0852 Last data filed at 01/09/2020 0436 Gross per 24 hour  Intake 689.1 ml  Output 1650 ml  Net -960.9 ml   Last 3 Weights 01/09/2020 01/08/2020 01/07/2020  Weight (lbs) 176 lb 5.9 oz 174 lb 13.2 oz 173 lb 11.2 oz  Weight (kg) 80 kg 79.3 kg 78.79 kg      Telemetry    SR 60-70s Personally reviewed  Physical Exam   General:  Well appearing. No resp difficulty HEENT: normal Neck: supple. no JVD. Carotids 2+ bilat; no bruits. No lymphadenopathy or thryomegaly appreciated. Cor: PMI nondisplaced. Regular rate & rhythm. No rubs, gallops or murmurs. Lungs: clear Abdomen: soft, nontender, nondistended. No hepatosplenomegaly. No bruits or  masses. Good bowel sounds. Extremities: no cyanosis, clubbing, rash, edema R wrist bruised  No bruit Neuro: alert & orientedx3, cranial nerves grossly intact. moves all 4 extremities w/o difficulty. Affect pleasant   Labs    High Sensitivity Troponin:   Recent Labs  Lab 01/05/20 1910 01/05/20 2133 01/06/20 1053  TROPONINIHS 33* 34* 40*      Chemistry Recent Labs  Lab 01/05/20 1712 01/05/20 1911 01/07/20 0347 01/08/20 0355  NA 136  --  137 139  K 3.8  --  3.1* 3.7  CL 100  --  102 106  CO2 24  --  25 25  GLUCOSE 199*  --  171* 212*  BUN 16  --  25* 26*  CREATININE 1.52*  --  1.74* 1.69*  CALCIUM 9.0  --  8.8* 8.4*  PROT  --  7.7  --   --   ALBUMIN  --  3.5  --   --   AST  --  69*  --   --   ALT  --  70*  --   --   ALKPHOS  --  128*  --   --   BILITOT  --  0.4  --   --   GFRNONAA 37*  --  31* 32*  GFRAA 43*  --  36* 38*  ANIONGAP 12  --  10 8     Hematology Recent Labs  Lab 01/05/20 1712 01/07/20 0347  WBC 6.9 6.6  RBC 4.45 4.03  HGB 13.6 12.4  HCT 41.0 37.4  MCV 92.1 92.8  MCH 30.6 30.8  MCHC 33.2 33.2  RDW 16.8* 16.6*  PLT 255 227    BNP Recent Labs  Lab 01/05/20 1712  BNP 113.7*     DDimer No results for input(s): DDIMER in the last 168 hours.   Radiology    CARDIAC CATHETERIZATION  Result Date: 01/08/2020  Mid RCA lesion is 50% stenosed. RFR 1.0. Not significant.  Continue aggressive medical therapy for heart failure team.   CARDIAC CATHETERIZATION  Result Date: 01/08/2020  Ost RCA to Prox RCA lesion is 20% stenosed.  Prox RCA to Mid RCA lesion is 60% stenosed.  Mid Cx lesion is 30% stenosed.  Mid LAD lesion is 20% stenosed.  Findings: 1. Borderline mRCA lesion ~60% 2. Minimal non-obstructive CAD 3. EF 35-40% with LVEDP = 14 Assessment/Plan: FFR with RCA. Glori Bickers, MD 1:40 PM   NM Myocar Multi W/Spect Tamela Oddi Motion / EF  Result Date: 01/07/2020  The left ventricular ejection fraction is moderately decreased (30-44%).  Ventricular paced rhythm.  Nuclear stress EF: 39%. There is septal wall hypokinesis.  Defect 1: There is a large defect of severe severity present in the basal inferior, basal inferolateral, mid inferior and apical inferior location.  Findings consistent with inferior/inferolateral ischemia and prior partial myocardial infarction with peri-infarct ischemia in this region as well.  This is a high risk study.  Candee Furbish, MD   Cardiac Studies   Echo 11/2018 1. The left ventricle has moderate-severely reduced systolic function,  with an ejection fraction of 30-35%. The cavity size was normal. Left  ventricular diastolic Doppler parameters are consistent with impaired  relaxation Elevated left ventricular  end-diastolic pressure Left ventricular diffuse hypokinesis.  2. The right ventricle has normal systolic function. The cavity was  normal. There is no increase in right ventricular wall thickness. Right  ventricular systolic pressure normal with an estimated pressure of 26.7  mmHg.  3. Left atrial size was severely dilated.  4. The mitral valve is normal in structure. Mild thickening of the mitral  valve leaflet. Mild calcification of the anterior mitral valve leaflet.  5. The tricuspid valve is normal in structure.  6. The aortic valve is tricuspid Mild sclerosis of the aortic valve.  7. The pulmonic valve was normal in structure.    Right Heart Cardiac Cath 2018 Findings:  RA = 5 RV = 49/4 PA = 55/18 (35) PCW = 18 Fick cardiac output/index = 4.5/2.5 Thermo CO/CI = 4.5/2.5 PVR = 3.8 WU FA sat = 95% PA sat = 54%, 54%  Assessment: 1. Well-compensated filling pressures 2. Mildly reduced cardiac output  Plan/Discussion:  Continue current therapy. Possible home in am.    Patient Profile     59 y.o. female with a history of NICM with normal cors in 2014, thought to be related to HTN vs noncompliance vs tachy-mediated with history of atrial tach, CKD 3b, HFrEF  w/ EF 20-25%, DM, HTN, HLD, STJ CRT-D.  The patient presented to the ED 01/05/20 for chest pain. The patient says she has intermittent sharp twinges of chest pain about once a week. 3 days ago she felt sharp  chest pain on the left side underneath her left breast. It was 6/10 and patient felt associated nasuea and sob. The pain lingered on and went up into her neck. Pain was waxing and waning over the last few days. Walking did not seem to make it worse. When the pain persisted the patient decided to go to the ER for evaluation. She denies recent fever, chills, illness, LLE, abdominal bloating, weight gain.   Assessment & Plan    1. Chest pain/non-obstructive CAD - cath 5/7. RCA 50-60% flow wire 1.0 (non-flow limiting) - wrist access site mildly sore but no bruit or swelling - await BMET this am. If ok can go - continue ASA/statin  2. Chronic HFrEF - volume status stable on exam - EF improved to 30-35% by echo 11/2018 - EF 39% by Myoview - continue torsemide, Imdur, BB, hydralazine - no ACE/ARB/DRNI due to decreased renal function  3. S/p STJ CRT-D - 8 mo left on battery - no sig events - follow as outpatient  For questions or updates, please contact [place] Please consult [email or web address] for contact info under       Signed, Glori Bickers, MD  01/09/2020, 8:52 AM

## 2020-01-09 NOTE — Discharge Summary (Signed)
Physician Discharge Summary  Galina Haddox DEY:[id number] DOB: Oct 28, 1960 DOA: 01/05/2020  PCP: Henderson Baltimore, [name]  Admit date: 01/05/2020 Discharge date: 01/09/2020  Admitted From: home Disposition:  home  Recommendations for Outpatient Follow-up:  1. Follow up with PCP in 1-2 weeks 2. Please obtain BMP/CBC in one week 3. Please follow up on the following pending results:  Home Health:no  Equipment/Devices: None  Discharge Condition: Stable Code Status: Full Diet recommendation:  Diet Order            Diet - low sodium heart healthy        Diet heart healthy/carb modified Room service appropriate? Yes; Fluid consistency: Thin  Diet effective now               Brief/Interim Summary:  59 y.o.femalewith a past medical history significant for sCHF EF 35%, CAD, obesity, disability, OSA not on CPAP, and DMwho presents with chest pain. The patient was in her usual healththe last 24 hours PTA.She had had some fleeting, sharp, moderate to severe left-sided chest pain, then on the day of admission, she noticed a left-sided dull discomfort that was present at rest, and did not go away. It was nonpositional, worsened with food, worsened with deep breathing, or lifting things.  ED course:Afebrile, heart rate normal, respirations and blood pressure normal, pulse ox normal on room air. Initial ECG showedpaced rhythmandhigh-sensitivity troponin was detectable but low and flat. Labs: DJ497, K3.8, Cr1.5(baseline 2.2), WBC2.9, Hgb13.6. Chest x-ray was unremarkable Patient was admitted with serial troponin.  Cardiology was consulted. Seen by cardiology patient underwent further evaluation with nuclear stress test that came back abnormal and cardiology plan for cardiac cath 5/7. Underwent cardiac cath found to have mid RCA 50% stenosis, advised medical management CHF management. Seen by cardiology Dr. Suezanne Jacquet morning and okay for discharge home on her home torsemide potassium  supplementation including her aspirin Lipitor, beta-blocker  Discharge Diagnoses:  Active Problems:   Chest pain   Abnormal stress test  Chest pain:Chest pain cardiac enzymes negative seen by cardiology order [facility] 01/07/2020.  Cont on Aspirin, Lipitor, Coreg. Stress test came back +/high risk see report- discussed w/ Dr Jeffie Pollock and for cardiac cath  01/08/20. She reports she had elevated trop in past but did not have MI or stents.  Underwent cardiac cath that showed mid RCA lesion 50% stenosis and advised current medical management.  Chronic Systolic CHF/hypertension/status post BiV pacemaker/CAD:  LVEF 30-35% 11/2018. Cardio eval apprecaited.Continue asa, coreg, lipitor, torsemide, Imdur, hydralazine. No ACE/ARB/DRNI due to decreased renal function.  Type 2 diabetes mellitus:Poorly controlled hemoglobin A1c 8.6 on 01/06/2020. Resume home trulity on d/c  Obesity with BMI 30/OSA: Advised weight loss healthy lifestyle  CKD stage IIIa: Creatinine stable. Monitor. Hydration for cath. Bmp in am post cath stable she will continue her home torsemide. Recent Labs  Lab 01/05/20 1712 01/07/20 0347 01/08/20 0355 01/09/20 0918  BUN 16 25* 26* 18  CREATININE 1.52* 1.74* 1.69* 1.47*   Hypokalemia-Repleted see already has potassium supplementation at home.  Consults:  Cardiology  Subjective: Resting comfortably on room air.  No chest pain. Wanting to go home today. Mild wrist pain at the site of cardiac cath access Discharge Exam: Vitals:   01/09/20 0441 01/09/20 0812  BP: (!) 141/68 (!) 143/71  Pulse: 71   Resp: 19   Temp: 98.4 F (36.9 C)   SpO2: 100%    General: Pt is alert, awake, not in acute distress Cardiovascular: RRR, S1/S2 +, no rubs, no  gallops Respiratory: CTA bilaterally, no wheezing, no rhonchi Abdominal: Soft, NT, ND, bowel sounds + Extremities: no edema, no cyanosis  Discharge Instructions  Discharge Instructions    (HEART FAILURE PATIENTS) Call MD:   Anytime you have any of the following symptoms: 1) 3 pound weight gain in 24 hours or 5 pounds in 1 week 2) shortness of breath, with or without a dry hacking cough 3) swelling in the hands, feet or stomach 4) if you have to sleep on extra pillows at night in order to breathe.   Complete by: As directed    Diet - low sodium heart healthy   Complete by: As directed    Discharge instructions   Complete by: As directed    Follow up with the cardiologist and primary care doctor as instructed in 1 to 2 weeks.  Please call call MD or return to ER for similar or worsening recurring problem that brought you to hospital or if any fever,nausea/vomiting,abdominal pain, uncontrolled pain, chest pain,  shortness of breath or any other alarming symptoms.  Please follow-up your doctor as instructed in a week time and call the office for appointment.  Please avoid alcohol, smoking, or any other illicit substance and maintain healthy habits including taking your regular medications as prescribed.  You were cared for by a hospitalist during your hospital stay. If you have any questions about your discharge medications or the care you received while you were in the hospital after you are discharged, you can call the unit and ask to speak with the hospitalist on call if the hospitalist that took care of you is not available.  Once you are discharged, your primary care physician will handle any further medical issues. Please note that NO REFILLS for any discharge medications will be authorized once you are discharged, as it is imperative that you return to your primary care physician (or establish a relationship with a primary care physician if you do not have one) for your aftercare needs so that they can reassess your need for medications and monitor your lab values   Increase activity slowly   Complete by: As directed      Allergies as of 01/09/2020      Reactions   Codeine Itching   Gabapentin Itching   Haldol  [haloperidol Lactate] Other (See Comments)   Dizziness, hallucinations   Levofloxacin    Other reaction(s): Other (See Comments) Projectile vomiting    Lisinopril Itching, Swelling   Losartan Itching   Morphine And Related Nausea And Vomiting   Penicillins Nausea And Vomiting   "Vomiting, upset stomach, Headache" Has patient had a PCN reaction causing immediate rash, facial/tongue/throat swelling, SOB or lightheadedness with hypotension: No Has patient had a PCN reaction causing severe rash involving mucus membranes or skin necrosis: No Has patient had a PCN reaction that required hospitalization: No Has patient had a PCN reaction occurring within the last 10 years: No If all of the above answers are "NO", then may proceed with Cephalosporin use.      Medication List    TAKE these medications   allopurinol 100 MG tablet Commonly known as: ZYLOPRIM Take 200 mg by mouth daily.   amiodarone 200 MG tablet Commonly known as: PACERONE Take 1 tablet (200 mg total) by mouth daily.   aspirin 81 MG chewable tablet Chew 81 mg by mouth daily.   atorvastatin 40 MG tablet Commonly known as: LIPITOR Take 40 mg by mouth at bedtime.   Calcium 600/Vitamin D 600-400  MG-UNIT Tabs Generic drug: Calcium Carbonate-Vitamin D3 Take 1 tablet by mouth 2 (two) times daily.   Calcium Carbonate-Vitamin D 600-400 MG-UNIT tablet Take 1 tablet by mouth in the morning.   carvedilol 25 MG tablet Commonly known as: COREG Take 12.5 mg by mouth 2 (two) times daily with a meal.   colchicine 0.6 MG tablet Take 0.6-1.2 mg by mouth See admin instructions.   cyclobenzaprine 5 MG tablet Commonly known as: FLEXERIL Take 5 mg by mouth 3 (three) times daily as needed for muscle spasms.   diclofenac sodium 1 % Gel Commonly known as: Voltaren Apply 2 g topically 4 (four) times daily. What changed:   when to take this  reasons to take this   ferrous sulfate 325 (65 FE) MG tablet Take 325 mg by mouth 3  (three) times daily.   hydrALAZINE 50 MG tablet Commonly known as: APRESOLINE Take 1.5 tablets (75 mg total) by mouth 3 (three) times daily.   Investigational - Study Medication Take 1 tablet by mouth 2 (two) times daily. Study name: [facility] Heart Failure Study Additional study details: Omecamtiv Mecarbil or Placebo   isosorbide mononitrate 60 MG 24 hr tablet Commonly known as: IMDUR Take 1 tablet (60 mg total) by mouth daily.   loratadine 10 MG tablet Commonly known as: CLARITIN Take 10 mg by mouth daily.   Magnesium Oxide 420 MG Tabs Take 840 mg by mouth daily.   multivitamin tablet Take 1 tablet by mouth daily.   omeprazole 40 MG capsule Commonly known as: PRILOSEC Take 40 mg by mouth daily.   potassium chloride 10 MEQ tablet Commonly known as: KLOR-CON Take 4 tablets (40 mEq total) by mouth daily.   rOPINIRole 0.5 MG tablet Commonly known as: REQUIP Take 0.5 mg by mouth at bedtime.   sertraline 100 MG tablet Commonly known as: ZOLOFT Take 150 mg by mouth at bedtime.   torsemide 20 MG tablet Commonly known as: DEMADEX Take 1 tablet (20 mg total) by mouth 2 (two) times daily.   Trulicity 7.12 WP/8.0DX Sopn Generic drug: Dulaglutide Inject 0.75 mg into the skin every Sunday.      Follow-up Information    [name], Melissa A, [name] Follow up in 1 week(s).   Specialty: Nurse Practitioner Contact information: [address] [place] [address] [phone]        Bensimhon, Shaune Pascal, MD .   Specialty: Cardiology Contact information: [place] [address] [phone]          Allergies  Allergen Reactions  . Codeine Itching  . Gabapentin Itching  . Haldol [Haloperidol Lactate] Other (See Comments)    Dizziness, hallucinations  . Levofloxacin     Other reaction(s): Other (See Comments) Projectile vomiting   . Lisinopril Itching and Swelling  . Losartan Itching  . Morphine And  Related Nausea And Vomiting  . Penicillins Nausea And Vomiting    "Vomiting, upset stomach, Headache" Has patient had a PCN reaction causing immediate rash, facial/tongue/throat swelling, SOB or lightheadedness with hypotension: No Has patient had a PCN reaction causing severe rash involving mucus membranes or skin necrosis: No Has patient had a PCN reaction that required hospitalization: No Has patient had a PCN reaction occurring within the last 10 years: No If all of the above answers are "NO", then may proceed with Cephalosporin use.     The results of significant diagnostics from this hospitalization (including imaging, microbiology, ancillary and laboratory)  are listed below for reference.    Microbiology: Recent Results (from the past 240 hour(s))  Respiratory Panel by RT PCR (Flu A&B, Covid) - Nasopharyngeal Swab     Status: None   Collection Time: 01/05/20 11:16 PM   Specimen: Nasopharyngeal Swab  Result Value Ref Range Status   SARS Coronavirus 2 by RT PCR NEGATIVE NEGATIVE Final    Comment: (NOTE) SARS-CoV-2 target nucleic acids are NOT DETECTED. The SARS-CoV-2 RNA is generally detectable in upper respiratoy specimens during the acute phase of infection. The lowest concentration of SARS-CoV-2 viral copies this assay can detect is 131 copies/mL. A negative result does not preclude SARS-Cov-2 infection and should not be used as the sole basis for treatment or other patient management decisions. A negative result may occur with  improper specimen collection/handling, submission of specimen other than nasopharyngeal swab, presence of viral mutation(s) within the areas targeted by this assay, and inadequate number of viral copies (<131 copies/mL). A negative result must be combined with clinical observations, patient history, and epidemiological information. The expected result is Negative. Fact Sheet for Patients:  PinkCheek.be Fact Sheet for  Healthcare Providers:  GravelBags.it This test is not yet ap proved or cleared by the [place] FDA and  has been authorized for detection and/or diagnosis of SARS-CoV-2 by FDA under an Emergency Use Authorization (EUA). This EUA will remain  in effect (meaning this test can be used) for the duration of the COVID-19 declaration under Section 564(b)(1) of the Act, 21 U.S.C. section 360bbb-3(b)(1), unless the authorization is terminated or revoked sooner.    Influenza A by PCR NEGATIVE NEGATIVE Final   Influenza B by PCR NEGATIVE NEGATIVE Final    Comment: (NOTE) The Xpert Xpress SARS-CoV-2/FLU/RSV assay is intended as an aid in  the diagnosis of influenza from Nasopharyngeal swab specimens and  should not be used as a sole basis for treatment. Nasal washings and  aspirates are unacceptable for Xpert Xpress SARS-CoV-2/FLU/RSV  testing. Fact Sheet for Patients: PinkCheek.be Fact Sheet for Healthcare Providers: GravelBags.it This test is not yet approved or cleared by the [place] FDA and  has been authorized for detection and/or diagnosis of SARS-CoV-2 by  FDA under an Emergency Use Authorization (EUA). This EUA will remain  in effect (meaning this test can be used) for the duration of the  Covid-19 declaration under Section 564(b)(1) of the Act, 21  U.S.C. section 360bbb-3(b)(1), unless the authorization is  terminated or revoked. Performed at [facility], [address] [place], [place] [address]     Procedures/Studies: CARDIAC CATHETERIZATION  Result Date: 01/08/2020  Mid RCA lesion is 50% stenosed. RFR 1.0. Not significant.  Continue aggressive medical therapy for heart failure team.   CARDIAC CATHETERIZATION  Result Date: 01/08/2020  Ost RCA to Prox RCA lesion is 20% stenosed.  Prox RCA to Mid RCA lesion is 60% stenosed.  Mid Cx lesion is 30% stenosed.  Mid LAD  lesion is 20% stenosed.  Findings: 1. Borderline mRCA lesion ~60% 2. Minimal non-obstructive CAD 3. EF 35-40% with LVEDP = 14 Assessment/Plan: FFR with RCA. Glori Bickers, MD 1:40 PM   NM Myocar Multi W/Spect Tamela Oddi Motion / EF  Result Date: 01/07/2020  The left ventricular ejection fraction is moderately decreased (30-44%). Ventricular paced rhythm.  Nuclear stress EF: 39%. There is septal wall hypokinesis.  Defect 1: There is a large defect of severe severity present in the basal inferior, basal inferolateral, mid inferior and apical inferior location.  Findings  consistent with inferior/inferolateral ischemia and prior partial myocardial infarction with peri-infarct ischemia in this region as well.  This is a high risk study.  Candee Furbish, MD  DG Chest Portable 1 View  Result Date: 01/05/2020 CLINICAL DATA:  Shortness of breath, nausea, palpitations EXAM: PORTABLE CHEST 1 VIEW COMPARISON:  10/06/2018 FINDINGS: Cardiomegaly with left chest multi lead pacer defibrillator. Both lungs are clear. Status post bilateral shoulder arthroplasty. IMPRESSION: Cardiomegaly without acute abnormality of the lungs. Electronically Signed   By: Eddie Candle M.D.   On: 01/05/2020 17:37    Labs: BNP (last 3 results) Recent Labs    01/05/20 1712  BNP 798.9*   Basic Metabolic Panel: Recent Labs  Lab 01/05/20 1712 01/07/20 0347 01/08/20 0355 01/09/20 0918  NA 136 137 139 137  K 3.8 3.1* 3.7 3.3*  CL 100 102 106 104  CO2 24 25 25  20*  GLUCOSE 199* 171* 212* 270*  BUN 16 25* 26* 18  CREATININE 1.52* 1.74* 1.69* 1.47*  CALCIUM 9.0 8.8* 8.4* 8.7*   Liver Function Tests: Recent Labs  Lab 01/05/20 1911  AST 69*  ALT 70*  ALKPHOS 128*  BILITOT 0.4  PROT 7.7  ALBUMIN 3.5   No results for input(s): LIPASE, AMYLASE in the last 168 hours. No results for input(s): AMMONIA in the last 168 hours. CBC: Recent Labs  Lab 01/05/20 1712 01/07/20 0347  WBC 6.9 6.6  NEUTROABS 4.1  --   HGB 13.6  12.4  HCT 41.0 37.4  MCV 92.1 92.8  PLT 255 227   Cardiac Enzymes: No results for input(s): CKTOTAL, CKMB, CKMBINDEX, TROPONINI in the last 168 hours. BNP: Invalid input(s): POCBNP CBG: Recent Labs  Lab 01/08/20 0813 01/08/20 1130 01/08/20 1657 01/08/20 2131 01/09/20 0757  GLUCAP 180* 165* 304* 166* 133*   D-Dimer No results for input(s): DDIMER in the last 72 hours. Hgb A1c Recent Labs    01/06/20 1138  HGBA1C 8.6*   Lipid Profile Recent Labs    01/07/20 0347  CHOL 149  HDL 55  LDLCALC 77  TRIG 84  CHOLHDL 2.7   Thyroid function studies Recent Labs    01/06/20 1138  TSH 2.230   Anemia work up No results for input(s): VITAMINB12, FOLATE, FERRITIN, TIBC, IRON, RETICCTPCT in the last 72 hours. Urinalysis    Component Value Date/Time   COLORURINE YELLOW 03/15/2017 1903   APPEARANCEUR CLEAR 03/15/2017 1903   LABSPEC 1.016 03/15/2017 1903   PHURINE 5.5 03/15/2017 1903   GLUCOSEU NEGATIVE 03/15/2017 1903   HGBUR NEGATIVE 03/15/2017 1903   BILIRUBINUR NEGATIVE 03/15/2017 1903   KETONESUR NEGATIVE 03/15/2017 1903   PROTEINUR NEGATIVE 03/15/2017 1903   NITRITE NEGATIVE 03/15/2017 1903   LEUKOCYTESUR MODERATE (A) 03/15/2017 1903   Sepsis Labs Invalid input(s): PROCALCITONIN,  WBC,  LACTICIDVEN Microbiology Recent Results (from the past 240 hour(s))  Respiratory Panel by RT PCR (Flu A&B, Covid) - Nasopharyngeal Swab     Status: None   Collection Time: 01/05/20 11:16 PM   Specimen: Nasopharyngeal Swab  Result Value Ref Range Status   SARS Coronavirus 2 by RT PCR NEGATIVE NEGATIVE Final    Comment: (NOTE) SARS-CoV-2 target nucleic acids are NOT DETECTED. The SARS-CoV-2 RNA is generally detectable in upper respiratoy specimens during the acute phase of infection. The lowest concentration of SARS-CoV-2 viral copies this assay can detect is 131 copies/mL. A negative result does not preclude SARS-Cov-2 infection and should not be used as the sole basis for  treatment or other patient management  decisions. A negative result may occur with  improper specimen collection/handling, submission of specimen other than nasopharyngeal swab, presence of viral mutation(s) within the areas targeted by this assay, and inadequate number of viral copies (<131 copies/mL). A negative result must be combined with clinical observations, patient history, and epidemiological information. The expected result is Negative. Fact Sheet for Patients:  PinkCheek.be Fact Sheet for Healthcare Providers:  GravelBags.it This test is not yet ap proved or cleared by the [place] FDA and  has been authorized for detection and/or diagnosis of SARS-CoV-2 by FDA under an Emergency Use Authorization (EUA). This EUA will remain  in effect (meaning this test can be used) for the duration of the COVID-19 declaration under Section 564(b)(1) of the Act, 21 U.S.C. section 360bbb-3(b)(1), unless the authorization is terminated or revoked sooner.    Influenza A by PCR NEGATIVE NEGATIVE Final   Influenza B by PCR NEGATIVE NEGATIVE Final    Comment: (NOTE) The Xpert Xpress SARS-CoV-2/FLU/RSV assay is intended as an aid in  the diagnosis of influenza from Nasopharyngeal swab specimens and  should not be used as a sole basis for treatment. Nasal washings and  aspirates are unacceptable for Xpert Xpress SARS-CoV-2/FLU/RSV  testing. Fact Sheet for Patients: PinkCheek.be Fact Sheet for Healthcare Providers: GravelBags.it This test is not yet approved or cleared by the [place] FDA and  has been authorized for detection and/or diagnosis of SARS-CoV-2 by  FDA under an Emergency Use Authorization (EUA). This EUA will remain  in effect (meaning this test can be used) for the duration of the  Covid-19 declaration under Section 564(b)(1) of the Act, 21  U.S.C. section  360bbb-3(b)(1), unless the authorization is  terminated or revoked. Performed at [facility], [address] [place], [place] [address]      Time coordinating discharge: 25  minutes  SIGNED: Antonieta Pert, MD  [facility] 01/09/2020, 10:52 AM  If 7PM-7AM, please contact night-coverage [email or web address]

## 2020-01-11 NOTE — Telephone Encounter (Signed)
[facility] [phone], spoke to Burgess Estelle. Patient was transferred into [facility]. Remotes scheduled monthly d/t 8 months left on battery. Per Mickel Baas, patient has not been seen in their DC since 02/09/2016, also attempted to reach out to the research nurse to see if patient was still active in study, unable to reach nurse. Mickel Baas did state she remembers the patient and does not think she is active in the study anymore. Ordered new bedside monitor for home transmissions.   Called patient advised her care has been transferred into [facility]. I also sent her a new box, patient made aware. Next home remote 02/11/20 patient made aware.

## 2020-01-12 ENCOUNTER — Other Ambulatory Visit (HOSPITAL_COMMUNITY): Payer: Self-pay

## 2020-01-12 ENCOUNTER — Ambulatory Visit (HOSPITAL_COMMUNITY)
Admit: 2020-01-12 | Discharge: 2020-01-12 | Disposition: A | Discharge status: Home / Self Care | Payer: Medicare Other | Source: Ambulatory Visit | Attending: Cardiology | Admitting: Cardiology

## 2020-01-12 ENCOUNTER — Other Ambulatory Visit: Payer: Self-pay

## 2020-01-12 ENCOUNTER — Encounter (HOSPITAL_COMMUNITY): Payer: Self-pay

## 2020-01-12 VITALS — BP 144/96 | HR 74 | Wt 175.2 lb

## 2020-01-12 DIAGNOSIS — Z79899 Other long term (current) drug therapy: Secondary | ICD-10-CM | POA: Diagnosis not present

## 2020-01-12 DIAGNOSIS — Z791 Long term (current) use of non-steroidal anti-inflammatories (NSAID): Secondary | ICD-10-CM | POA: Diagnosis not present

## 2020-01-12 DIAGNOSIS — K219 Gastro-esophageal reflux disease without esophagitis: Secondary | ICD-10-CM | POA: Diagnosis not present

## 2020-01-12 DIAGNOSIS — E785 Hyperlipidemia, unspecified: Secondary | ICD-10-CM | POA: Insufficient documentation

## 2020-01-12 DIAGNOSIS — I5022 Chronic systolic (congestive) heart failure: Secondary | ICD-10-CM | POA: Diagnosis not present

## 2020-01-12 DIAGNOSIS — I130 Hypertensive heart and chronic kidney disease with heart failure and stage 1 through stage 4 chronic kidney disease, or unspecified chronic kidney disease: Secondary | ICD-10-CM | POA: Insufficient documentation

## 2020-01-12 DIAGNOSIS — I252 Old myocardial infarction: Secondary | ICD-10-CM | POA: Diagnosis not present

## 2020-01-12 DIAGNOSIS — F329 Major depressive disorder, single episode, unspecified: Secondary | ICD-10-CM | POA: Insufficient documentation

## 2020-01-12 DIAGNOSIS — G4733 Obstructive sleep apnea (adult) (pediatric): Secondary | ICD-10-CM | POA: Insufficient documentation

## 2020-01-12 DIAGNOSIS — Z7982 Long term (current) use of aspirin: Secondary | ICD-10-CM | POA: Diagnosis not present

## 2020-01-12 DIAGNOSIS — M109 Gout, unspecified: Secondary | ICD-10-CM | POA: Insufficient documentation

## 2020-01-12 DIAGNOSIS — M1990 Unspecified osteoarthritis, unspecified site: Secondary | ICD-10-CM | POA: Diagnosis not present

## 2020-01-12 DIAGNOSIS — I428 Other cardiomyopathies: Secondary | ICD-10-CM | POA: Insufficient documentation

## 2020-01-12 DIAGNOSIS — K589 Irritable bowel syndrome without diarrhea: Secondary | ICD-10-CM | POA: Insufficient documentation

## 2020-01-12 DIAGNOSIS — I471 Supraventricular tachycardia: Secondary | ICD-10-CM | POA: Insufficient documentation

## 2020-01-12 DIAGNOSIS — E7800 Pure hypercholesterolemia, unspecified: Secondary | ICD-10-CM | POA: Insufficient documentation

## 2020-01-12 DIAGNOSIS — E1122 Type 2 diabetes mellitus with diabetic chronic kidney disease: Secondary | ICD-10-CM | POA: Diagnosis not present

## 2020-01-12 DIAGNOSIS — Z8249 Family history of ischemic heart disease and other diseases of the circulatory system: Secondary | ICD-10-CM | POA: Insufficient documentation

## 2020-01-12 DIAGNOSIS — N183 Chronic kidney disease, stage 3 unspecified: Secondary | ICD-10-CM | POA: Insufficient documentation

## 2020-01-12 DIAGNOSIS — Z95810 Presence of automatic (implantable) cardiac defibrillator: Secondary | ICD-10-CM | POA: Insufficient documentation

## 2020-01-12 DIAGNOSIS — I2510 Atherosclerotic heart disease of native coronary artery without angina pectoris: Secondary | ICD-10-CM | POA: Insufficient documentation

## 2020-01-12 LAB — BASIC METABOLIC PANEL
Anion gap: 12 (ref 5–15)
BUN: 16 mg/dL (ref 6–20)
CO2: 25 mmol/L (ref 22–32)
Calcium: 9.2 mg/dL (ref 8.9–10.3)
Chloride: 103 mmol/L (ref 98–111)
Creatinine, Ser: 1.43 mg/dL — ABNORMAL HIGH (ref 0.44–1.00)
GFR calc Af Amer: 46 mL/min — ABNORMAL LOW (ref >60)
GFR calc non Af Amer: 40 mL/min — ABNORMAL LOW (ref >60)
Glucose, Bld: 207 mg/dL — ABNORMAL HIGH (ref 70–99)
Potassium: 4 mmol/L (ref 3.5–5.1)
Sodium: 140 mmol/L (ref 135–145)

## 2020-01-12 MED ORDER — CARVEDILOL 25 MG PO TABS: 25.0000 mg | ORAL_TABLET | Freq: Two times a day (BID) | ORAL | 3 refills | Status: DC

## 2020-01-12 MED ORDER — ATORVASTATIN CALCIUM 80 MG PO TABS: 80.0000 mg | ORAL_TABLET | Freq: Every day | ORAL | 3 refills | Status: AC

## 2020-01-12 NOTE — Progress Notes (Signed)
Advanced Heart Failure Clinic Note   Primary Cardiologist: Michelle. Haroldine Andrade  Nephrology: Michelle Andrade [facility].   Reason for Visit: [facility] F/u for CP/CAD and Chronic Systolic Heart Failure  HPI:  Michelle Andrade is a 59 year old female with a past medical history of HTN, chronic systolic CHF s/p BI V ICD (St. Jude), atrial tachycardia (2017), NICM (normal cors in 2014), CKD stage III, HLD, DM, OSA w/CPAP, and anemia and new CAD noted on cath 01/2020.   She was admitted 12/26/16-12/27/16 with volume overload at [facility]. Echo that admission with EF 25-30%. She was discharged on Spiro 25mg  daily, Coreg 25mg  BID,and hydralazine 12.5mg  daily. Discharge weight was 177 pounds.   Her baseline creatinine appears to be 1.4-1.6 when reviewing her records from [facility]. Her last office visit with her Cardiologist Michelle Andrade was in January 2018 . He noted angioedema with ACE-I and cough with ARB.   She presented to the ED on 02/09/17 with SOB and abdominal bloating. Started on IV Lasix. RHC done and showed Fick output/index 4.5/2.5. PVR 3.8 WU. Discharge weight was 176 pounds.  Followed by Paramedicine.   Echo in 11/2018 showed LVEF 25-30%   Recently admitted earlier this month for atypical CP. Felt to be primarily muskuloskeletal, however ischemic w/u was persude given elevated troponin and other cardiac risk factors.  HS troponin 33>34. Had NST that was interpreted by reading provider to have several areas of ischemia/infarct concerning for multivessel CAD. She was subsequently set up for definitive LHC and gently hydrated prior given CKD. LHC showed moderate, nonobstructive CAD w/ 50-60% RCA, flow wire 1.0 (non-flow limiting). Medical therapy recommended. On ASA, statin and Imdur. Lipid panel showed LDL 77 mg/dL. Renal function ok post cath.   She presents to clinic today for f/u. Doing well. Continues w/ chest wall tenderness. No ischemic chest pain. Chronically NYHA Class  II-III. No resting dyspnea. Denies wt gain. No LEE. Fully compliant w/ medications.    Review of systems complete and found to be negative unless listed in HPI.    Past Medical History:  Diagnosis Date  . Arthritis 02/08/2017  . 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 CAD (coronary artery disease) 09/01/2013  . CHF (congestive heart failure) ([place]) 06/07/2015  . CKD (chronic kidney disease), stage III 12/09/2013  . Depression 07/27/2013  . Diabetes mellitus without complication ([place]) 25/95/6387  . GERD (gastroesophageal reflux disease) 07/27/2013  . Gout 12/29/2013  . Heart valve disorder 06/03/2013  . High cholesterol 11/10/2013  . Hypertension 11/10/2013  . IBS (irritable bowel syndrome) 09/27/2014  . Myocardial infarct ([place])   . Sleep apnea 10/13/2013  . Vascular disorder 01/12/2014    Current Outpatient Medications  Medication Sig Dispense Refill  . allopurinol (ZYLOPRIM) 100 MG tablet Take 200 mg by mouth daily.     [name] amiodarone (PACERONE) 200 MG tablet Take 1 tablet (200 mg total) by mouth daily. 30 tablet 3  . aspirin 81 MG chewable tablet Chew 81 mg by mouth daily.    [name] atorvastatin (LIPITOR) 40 MG tablet Take 40 mg by mouth at bedtime.     . Calcium Carbonate-Vitamin D 600-400 MG-UNIT tablet Take 1 tablet by mouth in the morning.     . Calcium Carbonate-Vitamin D3 (CALCIUM 600/VITAMIN D) 600-400 MG-UNIT TABS Take 1 tablet by mouth 2 (two) times daily.     . carvedilol (COREG) 25 MG tablet Take 12.5 mg by mouth 2 (two) times daily with a  meal.    . colchicine 0.6 MG tablet Take 0.6-1.2 mg by mouth See admin instructions.     . cyclobenzaprine (FLEXERIL) 5 MG tablet Take 5 mg by mouth 3 (three) times daily as needed for muscle spasms.    . diclofenac sodium (VOLTAREN) 1 % GEL Apply 2 g topically 4 (four) times daily. (Patient taking differently: Apply 2 g topically 2 (two) times daily as needed (arthritis pain). ) 100 g 0  . ferrous  sulfate 325 (65 FE) MG tablet Take 325 mg by mouth 3 (three) times daily.     . hydrALAZINE (APRESOLINE) 50 MG tablet Take 1.5 tablets (75 mg total) by mouth 3 (three) times daily. 405 tablet 3  . isosorbide mononitrate (IMDUR) 60 MG 24 hr tablet Take 1 tablet (60 mg total) by mouth daily. 30 tablet 0  . loratadine (CLARITIN) 10 MG tablet Take 10 mg by mouth daily.    . Magnesium Oxide 420 MG TABS Take 840 mg by mouth daily.    . Multiple Vitamin (MULTIVITAMIN) tablet Take 1 tablet by mouth daily.    [name] omeprazole (PRILOSEC) 40 MG capsule Take 40 mg by mouth daily.    . potassium chloride (KLOR-CON) 10 MEQ tablet Take 4 tablets (40 mEq total) by mouth daily. 240 tablet 11  . rOPINIRole (REQUIP) 0.5 MG tablet Take 0.5 mg by mouth at bedtime.     . sertraline (ZOLOFT) 100 MG tablet Take 150 mg by mouth at bedtime.     . torsemide (DEMADEX) 20 MG tablet Take 1 tablet (20 mg total) by mouth 2 (two) times daily. 120 tablet 3  . TRULICITY 4.13 KG/4.0NU SOPN Inject 0.75 mg into the skin every Sunday.     . Investigational - Study Medication Take 1 tablet by mouth 2 (two) times daily. Study name: Galactic Heart Failure Study Additional study details: Omecamtiv Mecarbil or Placebo (Patient not taking: Reported on 11/11/2019) 1 each PRN   No current facility-administered medications for this encounter.   Allergies  Allergen Reactions  . Codeine Itching  . Gabapentin Itching  . Haldol [Haloperidol Lactate] Other (See Comments)    Dizziness, hallucinations  . Levofloxacin     Other reaction(s): Other (See Comments) Projectile vomiting   . Lisinopril Itching and Swelling  . Losartan Itching  . Morphine And Related Nausea And Vomiting  . Penicillins Nausea And Vomiting    "Vomiting, upset stomach, Headache" Has patient had a PCN reaction causing immediate rash, facial/tongue/throat swelling, SOB or lightheadedness with hypotension: No Has patient had a PCN reaction causing severe rash involving mucus  membranes or skin necrosis: No Has patient had a PCN reaction that required hospitalization: No Has patient had a PCN reaction occurring within the last 10 years: No If all of the above answers are "NO", then may proceed with Cephalosporin use.    Social History   Socioeconomic History  . Marital status: Single    Spouse name: Not on file  . Number of children: Not on file  . Years of education: Not on file  . Highest education level: Not on file  Occupational History  . Not on file  Tobacco Use  . Smoking status: Never Smoker  . Smokeless tobacco: Never Used  Substance and Sexual Activity  . Alcohol use: No  . Drug use: No  . Sexual activity: Not on file  Other Topics Concern  . Not on file  Social History Narrative  . Not on file   Social Determinants of  Health   Financial Resource Strain:   . Difficulty of Paying Living Expenses:   Food Insecurity:   . Worried About Charity fundraiser in the Last Year:   . Arboriculturist in the Last Year:   Transportation Needs:   . Film/video editor (Medical):   [name] Lack of Transportation (Non-Medical):   Physical Activity:   . Days of Exercise per Week:   . Minutes of Exercise per Session:   Stress:   . Feeling of Stress :   Social Connections:   . Frequency of Communication with Friends and Family:   . Frequency of Social Gatherings with Friends and Family:   . Attends Religious Services:   . Active Member of Clubs or Organizations:   . Attends Archivist Meetings:   [name] Marital Status:   Intimate Partner Violence:   . Fear of Current or Ex-Partner:   . Emotionally Abused:   [name] Physically Abused:   . Sexually Abused:    Family History  Problem Relation Age of Onset  . Hypertension Mother    Vitals:   01/12/20 1021  BP: (!) 144/96  Pulse: 74  SpO2: 98%  Weight: 79.5 kg   Wt Readings from Last 3 Encounters:  01/12/20 79.5 kg  01/09/20 80 kg  12/23/19 80.7 kg    PHYSICAL EXAM: General:  Well  appearing. No respiratory difficulty HEENT: normal Neck: supple. no JVD. Carotids 2+ bilat; no bruits. No lymphadenopathy or thyromegaly appreciated. Cor: PMI nondisplaced. Regular rate & rhythm. No rubs, gallops or murmurs. Lungs: clear Abdomen: soft, nontender, nondistended. No hepatosplenomegaly. No bruits or masses. Andrade bowel sounds. Extremities: no cyanosis, clubbing, rash, edema Neuro: alert & oriented x 3, cranial nerves grossly intact. moves all 4 extremities w/o difficulty. Affect pleasant.    ASSESSMENT & PLAN: 1. Chronic systolic CHF: NICM, normal cors in 2014, likely related to HTN vs. Noncompliance vs. Tachy - medicated with history of atrial tach. - Echo 02/2017 EF 20%, moderate MR, moderate TR. PA pressure 80 mm Hg.   - ECHO 08/07/2017 EF 20-25%.  - Echo 11/2018 EF 25-30% - Chronic NYHA III-III - Volume status stable. Continue torsemide 40 mg BID.  - no ARB/ ANRI, spiro nor digoxin due to CKD  - Continue isosorbide 60 mg daily - Continue hydralazine 75 mg tid  - Increase Coreg to 25 mg BID.  - discussed daily wts and low sodium diet  - Has St Jude ICD. Recent device interrogation shows 8 month battery life on ICD. Michelle. Curt Bears will do gen change   2. CAD - recent diagnostic LHC 01/2020 showed 50-60% RCA, negative FFR (non-flow limiting) - continue medical therapy w/ ASA + Statin+ Imdur +  blocker  3. HLD - recent lipid panel w/ LDL 77. Goal given known CAD < 70 - increase Lipitor to 80 mg qhs -repeat FLP + HFTs in 8 weeks   4. CKD stage III: Baseline creatinine 1.3-1.6.  - check BMP today   5. HTN - mildly elevated - Increase Coreg to 25 mg daily   6. History of atrial tachycardia -  Continue amiodarone 200 mg daily  -  LFTs and TFTS recently checked and normal (presonally reviewed) -  reminded to get annual eye exams  Continue w/ paramedicine. F/u in 6 month.    Michelle Jester, PA-C 01/12/20

## 2020-01-12 NOTE — Patient Instructions (Signed)
INCREASE Lipitor to 80mg  daily  INCREASE Carvedilol to 25mg  twice daily  Routine lab work today. Will notify you of abnormal results  Recheck lab work in 2 months  Follow up with Dr.Bensimhon in 6 months (please call our office in October for your November appointment)

## 2020-01-12 NOTE — Progress Notes (Addendum)
Paramedicine Encounter   Patient ID: Michelle Andrade , female,   DOB: 03-31-1961,58 y.o.,  MRN: [id number]  Michelle Andrade was seen at the HF clinic today and reported feeling well. Per Michelle Andrade, she is to increase atorvastatin 80 mg daily. They are also increasing carvedilol to 25 mg daily. I will come by tomorrow to adjust her medications to reflect the changes.   [name], EMT 01/12/2020   ACTION: Next visit planned for tomorrow

## 2020-01-13 ENCOUNTER — Other Ambulatory Visit (HOSPITAL_COMMUNITY): Payer: Self-pay

## 2020-01-13 NOTE — Progress Notes (Signed)
Michelle Andrade was seen at home today to amend her pillboxes to comply with the changes made yesterday. Nothing further was needed at this time. I will follow up next week.  [name], EMT 01/13/20

## 2020-01-15 ENCOUNTER — Telehealth: Payer: Self-pay | Admitting: *Deleted

## 2020-01-15 NOTE — Telephone Encounter (Signed)
Spoke with patient to advise that new Merlin Monitor was ordered 01/11/20 per system. Encouraged pt to call the DC when transmission received. She is in agreement with plan and denies additional questions or concerns at this time.

## 2020-01-20 NOTE — Telephone Encounter (Signed)
Spoke with patient to advise that Merlin monitor is up to date with the network as of 01/20/20, but is not communicating with her ICD. Pt verbalizes understanding and agrees to move monitor to her bedside to encourage automaticity. Pt denies additional questions or concerns at this time. Will check Merlin in a few days to ensure monitor is connected.

## 2020-01-21 NOTE — Telephone Encounter (Signed)
Verified communication with Location manager occurred today

## 2020-01-22 ENCOUNTER — Other Ambulatory Visit (HOSPITAL_COMMUNITY): Payer: Self-pay

## 2020-01-22 NOTE — Progress Notes (Signed)
Paramedicine Encounter    Patient ID: Michelle Andrade, female    DOB: 07-Jan-1961, 59 y.o.   MRN: [id number]   Patient Care Team: Henderson Baltimore, [name] as PCP - General (Nurse Practitioner) Haroldine Laws Shaune Pascal, MD as PCP - Cardiology (Cardiology) Jorge Ny, LCSW as Social Worker (Licensed Clinical Social Worker)  Patient Active Problem List   Diagnosis Date Noted  . Abnormal stress test   . Chest pain 01/05/2020  . Secondary hyperparathyroidism of renal origin ([place]) 03/20/2018  . Severe obesity (BMI 35.0-39.9) with comorbidity ([place]) 12/19/2017  . CKD (chronic kidney disease) stage 4, GFR 15-29 ml/min (HCC) 06/07/2017  . Acute on chronic systolic (congestive) heart failure ([place])   . CHF (congestive heart failure) ([place]) 02/09/2017  . Pulmonary vascular congestion 02/09/2017  . Hypertension   . Sleep apnea   . Chondrocalcinosis of both knees 02/08/2017  . Primary osteoarthritis of both knees 02/08/2017  . Type 2 diabetes mellitus ([place]) 11/16/2016  . Borderline personality disorder ([place]) 10/17/2016  . Carpal tunnel syndrome 10/17/2016  . 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 Eczema 07/03/2016  . Atrial tachycardia ([place]) 05/31/2016  . 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 Onychomycosis of toenail 06/08/2015  . Cardiomyopathy ([place]) 06/07/2015  . Irritable bowel syndrome 09/27/2014  . PAD (peripheral artery disease) ([place]) 01/12/2014  . History of DVT (deep vein thrombosis) 01/12/2014  . Acute gout of right knee 12/29/2013  . Iron deficiency anemia 12/29/2013  . Lichenification and lichen simplex chronicus 12/26/2013  . Hyperlipidemia associated with type 2 diabetes mellitus ([place]) 11/10/2013  . Benign hypertension with chronic kidney disease, stage IV ([place]) 11/10/2013  . Disorder of magnesium metabolism 10/13/2013  . CAD in native artery 09/01/2013  . Anxiety state 07/27/2013  .  Depressive disorder 07/27/2013  . Gastro-esophageal reflux disease without esophagitis 07/27/2013  . Low back pain 06/03/2013  . Mitral valve disease 06/03/2013  . Pulmonary valve disorder 06/03/2013  . Tricuspid valve disorder 06/03/2013  . 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 Allergic rhinitis 02/07/2013  . 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 Pain in joint 02/07/2013    Current Outpatient Medications:  .  allopurinol (ZYLOPRIM) 100 MG tablet, Take 200 mg by mouth daily. , Disp: , Rfl:  .  amiodarone (PACERONE) 200 MG tablet, Take 1 tablet (200 mg total) by mouth daily., Disp: 30 tablet, Rfl: 3 .  aspirin 81 MG chewable tablet, Chew 81 mg by mouth daily., Disp: , Rfl:  .  atorvastatin (LIPITOR) 80 MG tablet, Take 1 tablet (80 mg total) by mouth at bedtime., Disp: 90 tablet, Rfl: 3 .  Calcium Carbonate-Vitamin D3 (CALCIUM 600/VITAMIN D) 600-400 MG-UNIT TABS, Take 1 tablet by mouth 2 (two) times daily. , Disp: , Rfl:  .  carvedilol (COREG) 25 MG tablet, Take 1 tablet (25 mg total) by mouth 2 (two) times daily with a meal., Disp: 60 tablet, Rfl: 3 .  diclofenac sodium (VOLTAREN) 1 % GEL, Apply 2 g topically 4 (four) times daily. (Patient taking differently: Apply 2 g topically 2 (two) times daily as needed (arthritis pain). ), Disp: 100 g, Rfl: 0 .  ferrous sulfate 325 (65 FE) MG tablet, Take 325 mg by mouth 3 (three) times daily. , Disp: , Rfl:  .  hydrALAZINE (APRESOLINE) 50 MG tablet, Take 1.5 tablets (75 mg total) by mouth 3 (three) times daily., Disp: 405 tablet, Rfl: 3 .  isosorbide mononitrate (IMDUR) 60 MG 24 hr tablet,  Take 1 tablet (60 mg total) by mouth daily., Disp: 30 tablet, Rfl: 0 .  loratadine (CLARITIN) 10 MG tablet, Take 10 mg by mouth daily., Disp: , Rfl:  .  Magnesium Oxide 420 MG TABS, Take 840 mg by mouth daily., Disp: , Rfl:  .  Multiple Vitamin (MULTIVITAMIN) tablet, Take 1 tablet by mouth daily., Disp: , Rfl:  .  omeprazole (PRILOSEC) 40 MG capsule, Take 40 mg  by mouth daily., Disp: , Rfl:  .  potassium chloride (KLOR-CON) 10 MEQ tablet, Take 4 tablets (40 mEq total) by mouth daily., Disp: 240 tablet, Rfl: 11 .  rOPINIRole (REQUIP) 0.5 MG tablet, Take 0.5 mg by mouth at bedtime. , Disp: , Rfl:  .  sertraline (ZOLOFT) 100 MG tablet, Take 150 mg by mouth at bedtime. , Disp: , Rfl:  .  torsemide (DEMADEX) 20 MG tablet, Take 1 tablet (20 mg total) by mouth 2 (two) times daily., Disp: 120 tablet, Rfl: 3 .  TRULICITY 7.35 HG/9.9ME SOPN, Inject 0.75 mg into the skin every Sunday. , Disp: , Rfl:  .  Calcium Carbonate-Vitamin D 600-400 MG-UNIT tablet, Take 1 tablet by mouth in the morning. , Disp: , Rfl:  .  colchicine 0.6 MG tablet, Take 0.6-1.2 mg by mouth See admin instructions. , Disp: , Rfl:  .  cyclobenzaprine (FLEXERIL) 5 MG tablet, Take 5 mg by mouth 3 (three) times daily as needed for muscle spasms., Disp: , Rfl:  .  Investigational - Study Medication, Take 1 tablet by mouth 2 (two) times daily. Study name: Galactic Heart Failure Study Additional study details: Omecamtiv Mecarbil or Placebo (Patient not taking: Reported on 11/11/2019), Disp: 1 each, Rfl: PRN Allergies  Allergen Reactions  . Codeine Itching  . Gabapentin Itching  . Haldol [Haloperidol Lactate] Other (See Comments)    Dizziness, hallucinations  . Levofloxacin     Other reaction(s): Other (See Comments) Projectile vomiting   . Lisinopril Itching and Swelling  . Losartan Itching  . Morphine And Related Nausea And Vomiting  . Penicillins Nausea And Vomiting    "Vomiting, upset stomach, Headache" Has patient had a PCN reaction causing immediate rash, facial/tongue/throat swelling, SOB or lightheadedness with hypotension: No Has patient had a PCN reaction causing severe rash involving mucus membranes or skin necrosis: No Has patient had a PCN reaction that required hospitalization: No Has patient had a PCN reaction occurring within the last 10 years: No If all of the above answers are  "NO", then may proceed with Cephalosporin use.       Social History   Socioeconomic History  . Marital status: Single    Spouse name: Not on file  . Number of children: Not on file  . Years of education: Not on file  . Highest education level: Not on file  Occupational History  . Not on file  Tobacco Use  . Smoking status: Never Smoker  . Smokeless tobacco: Never Used  Substance and Sexual Activity  . Alcohol use: No  . Drug use: No  . Sexual activity: Not on file  Other Topics Concern  . Not on file  Social History Narrative  . Not on file   Social Determinants of Health   Financial Resource Strain:   . Difficulty of Paying Living Expenses:   Food Insecurity:   . Worried About Charity fundraiser in the Last Year:   . Arboriculturist in the Last Year:   Transportation Needs:   . Lack of Transportation (  Medical):   [name] Lack of Transportation (Non-Medical):   Physical Activity:   . Days of Exercise per Week:   . Minutes of Exercise per Session:   Stress:   . Feeling of Stress :   Social Connections:   . Frequency of Communication with Friends and Family:   . Frequency of Social Gatherings with Friends and Family:   . Attends Religious Services:   . Active Member of Clubs or Organizations:   . Attends Archivist Meetings:   [name] Marital Status:   Intimate Partner Violence:   . Fear of Current or Ex-Partner:   . Emotionally Abused:   [name] Physically Abused:   . Sexually Abused:     Physical Exam Cardiovascular:     Rate and Rhythm: Normal rate and regular rhythm.     Pulses: Normal pulses.  Pulmonary:     Effort: Pulmonary effort is normal.     Breath sounds: Normal breath sounds.  Musculoskeletal:        General: Normal range of motion.     Right lower leg: No edema.     Left lower leg: No edema.  Skin:    General: Skin is warm and dry.     Capillary Refill: Capillary refill takes less than 2 seconds.  Neurological:     Mental Status: She is  alert and oriented to person, place, and time.  Psychiatric:        Mood and Affect: Mood normal.         Future Appointments  Date Time Provider [place]  02/11/2020  9:30 AM [facility] DEVICE REMOTES [facility]  03/14/2020  9:30 AM [facility] DEVICE REMOTES [facility]  03/15/2020 11:00 AM [facility] LAB [facility] None  04/14/2020  9:30 AM [facility] DEVICE REMOTES [facility]  05/16/2020  9:30 AM [facility] DEVICE REMOTES [facility]  06/16/2020  9:30 AM [facility] DEVICE REMOTES [facility]  07/18/2020  9:30 AM [facility] DEVICE REMOTES [facility]  08/18/2020  9:30 AM [facility] DEVICE REMOTES [facility]  09/19/2020  9:30 AM [facility] DEVICE REMOTES [facility]  10/20/2020  9:30 AM [facility] DEVICE REMOTES [facility]  11/21/2020  9:30 AM [facility] DEVICE REMOTES [facility]  12/22/2020  9:30 AM [facility] DEVICE REMOTES [facility]    BP 102/67 (BP Location: Left Arm, Patient Position: Sitting, Cuff Size: Normal)   Pulse 71   Resp 18   Wt 173 lb 1.9 oz (78.5 kg)   SpO2 98%   BMI 30.67 kg/m   Weight yesterday- 173.2 lb Last visit weight-175.2 lb  Ms Chandley was seen at home today and reported feeling well. She denied chest pain, SOB, headache, dizziness, orthopnea, fever or cough since being discharged last week. She had been compliant with her medications and her weight has been stable. Her medications were verified and her pillbox was refilled. I will follow up in two weeks.   [name], EMT 01/22/20  ACTION: Home visit completed Next visit planned for 2 weeks

## 2020-01-28 ENCOUNTER — Other Ambulatory Visit: Payer: Self-pay

## 2020-01-28 NOTE — Telephone Encounter (Addendum)
This is a CHF pt, pt's medication needs to be sent to [facility]. Please address

## 2020-01-29 ENCOUNTER — Other Ambulatory Visit (HOSPITAL_COMMUNITY): Payer: Self-pay

## 2020-01-29 ENCOUNTER — Telehealth (HOSPITAL_COMMUNITY): Payer: Self-pay | Admitting: *Deleted

## 2020-01-29 DIAGNOSIS — R7989 Other specified abnormal findings of blood chemistry: Secondary | ICD-10-CM

## 2020-01-29 DIAGNOSIS — I5022 Chronic systolic (congestive) heart failure: Secondary | ICD-10-CM

## 2020-01-29 MED ORDER — AMIODARONE HCL 200 MG PO TABS: 200.0000 mg | ORAL_TABLET | Freq: Every day | ORAL | 3 refills | Status: DC

## 2020-01-29 NOTE — Telephone Encounter (Signed)
Zack called to report [facility] and [facility] recommend she stop her Amio due to elevated LFT's. She reported to [place] they did not do lab work but were just reviewed her previous labs.  Per our records:   Ref Range & Units 3 wk ago  (01/05/20) 2 mo ago  (11/05/19) 1 yr ago  (11/13/18) 2 yr ago  (04/10/17) 2 yr ago  (04/05/17) 2 yr ago  (02/12/17)  Total Protein 6.5 - 8.1 g/dL 7.7  6.8 R  7.8  7.3  7.8  6.9   Albumin 3.5 - 5.0 g/dL 3.5  3.7Low  R  3.4Low   3.8  4.0  3.6   AST 15 - 41 U/L 69High   37 R  22  51High   62High   24   ALT 0 - 44 U/L 70High   28 R  18  58High  R  71High  R  17 R   Alkaline Phosphatase 38 - 126 U/L 128High   146High  R  100  84  89  63   Total Bilirubin 0.3 - 1.2 mg/dL 0.4  0.2 R  0.4  0.3  0.5  0.3   Bilirubin, Direct 0.0 - 0.2 mg/dL 0.1    <0.1Low  R  <0.1Low  R    Indirect Bilirubin 0.3 - 0.9 mg/dL 0.3         OV note 5/11 states LFTs normal  Advised pt should continue Amio for now, will send to Dr Haroldine Laws for review.

## 2020-02-02 ENCOUNTER — Emergency Department (HOSPITAL_BASED_OUTPATIENT_CLINIC_OR_DEPARTMENT_OTHER)
Admission: EM | Admit: 2020-02-02 | Discharge: 2020-02-02 | Disposition: A | Discharge status: Home / Self Care | Payer: Medicare Other | Attending: Emergency Medicine | Admitting: Emergency Medicine

## 2020-02-02 ENCOUNTER — Other Ambulatory Visit: Payer: Self-pay

## 2020-02-02 ENCOUNTER — Encounter (HOSPITAL_BASED_OUTPATIENT_CLINIC_OR_DEPARTMENT_OTHER): Payer: Self-pay | Admitting: *Deleted

## 2020-02-02 DIAGNOSIS — E1169 Type 2 diabetes mellitus with other specified complication: Secondary | ICD-10-CM | POA: Diagnosis not present

## 2020-02-02 DIAGNOSIS — I130 Hypertensive heart and chronic kidney disease with heart failure and stage 1 through stage 4 chronic kidney disease, or unspecified chronic kidney disease: Secondary | ICD-10-CM | POA: Diagnosis not present

## 2020-02-02 DIAGNOSIS — I252 Old myocardial infarction: Secondary | ICD-10-CM | POA: Insufficient documentation

## 2020-02-02 DIAGNOSIS — E785 Hyperlipidemia, unspecified: Secondary | ICD-10-CM | POA: Insufficient documentation

## 2020-02-02 DIAGNOSIS — Z7982 Long term (current) use of aspirin: Secondary | ICD-10-CM | POA: Insufficient documentation

## 2020-02-02 DIAGNOSIS — E1122 Type 2 diabetes mellitus with diabetic chronic kidney disease: Secondary | ICD-10-CM | POA: Insufficient documentation

## 2020-02-02 DIAGNOSIS — H5711 Ocular pain, right eye: Secondary | ICD-10-CM | POA: Insufficient documentation

## 2020-02-02 DIAGNOSIS — I2510 Atherosclerotic heart disease of native coronary artery without angina pectoris: Secondary | ICD-10-CM | POA: Insufficient documentation

## 2020-02-02 DIAGNOSIS — I509 Heart failure, unspecified: Secondary | ICD-10-CM | POA: Diagnosis not present

## 2020-02-02 DIAGNOSIS — Z79899 Other long term (current) drug therapy: Secondary | ICD-10-CM | POA: Diagnosis not present

## 2020-02-02 DIAGNOSIS — Z7984 Long term (current) use of oral hypoglycemic drugs: Secondary | ICD-10-CM | POA: Diagnosis not present

## 2020-02-02 DIAGNOSIS — N183 Chronic kidney disease, stage 3 unspecified: Secondary | ICD-10-CM | POA: Diagnosis not present

## 2020-02-02 MED ORDER — AMIODARONE HCL 200 MG PO TABS: 100.0000 mg | ORAL_TABLET | Freq: Every day | ORAL | 3 refills | Status: DC

## 2020-02-02 MED ORDER — TETRACAINE HCL 0.5 % OP SOLN
OPHTHALMIC | Status: AC
  Administered 2020-02-02: 2 [drp]
  Filled 2020-02-02: qty 4

## 2020-02-02 MED ORDER — FLUORESCEIN SODIUM 1 MG OP STRP
ORAL_STRIP | OPHTHALMIC | Status: AC
  Administered 2020-02-02: 1
  Filled 2020-02-02: qty 1

## 2020-02-02 NOTE — Telephone Encounter (Signed)
Michelle Andrade is aware, Michelle Andrade states Michelle Andrade sees pt tomorrow and will adjust meds at that time, Michelle Andrade will call us while in the home to schedule labs for 2 weeks

## 2020-02-02 NOTE — Telephone Encounter (Signed)
Drop amio to 100. Repeat lfts in 2 weeks.

## 2020-02-02 NOTE — Discharge Instructions (Signed)
Please go immediately to [facility] ophthalmology office. I discussed your case with Dr.  Ellie Lunch who is currently at the hospital but will be back in the office after.

## 2020-02-02 NOTE — ED Triage Notes (Signed)
Presents with Rt eye pain, onset this past Sunday, had sudden onset of pain, very difficulty to open eye lid, very light sensitive, Rt eye lid appears swollen

## 2020-02-02 NOTE — ED Provider Notes (Signed)
Medical screening examination/treatment/procedure(s) were conducted as a shared visit with non-physician practitioner(s) and myself.  I personally evaluated the patient during the encounter. Briefly, the patient is a 59 y.o. female with history of CKD, diabetes who presents to the ED with right eye pain.  Pain for the last several days.  Started Sunday.  No trauma.  Seen after physician assistant concerned that intraocular pressure was elevated to 52.  No obvious corneal abrasion on exam.  Patient has consensual photophobia.  Sluggish pupil on the right compared to the left.  Does have conjunctival irritation.  Intraocular pressure done by me was 26 and 32.  Extraocular movements are intact.  Pupil sizes appear equal.  However lower suspicion for glaucoma more likely uveitis/iritis.  Visual acuity of the right eye is about 20/100.  Will talk to ophthalmology about arranging follow-up today outpatient.   EKG Interpretation None           Lennice Sites, DO 02/02/20 1126

## 2020-02-02 NOTE — ED Provider Notes (Signed)
[facility] EMERGENCY DEPARTMENT Provider Note   CSN: [id number] Arrival date & time: 02/02/20  1038     History Chief Complaint  Patient presents with  . Rt Eye Pain    Michelle Andrade is a 59 y.o. female.  HPI  Patient is a 59 year old female with past medical history of HTN, HLD, CKD, CHF, CAD, Biv.  Pacemaker, sleep apnea.  Patient states she has sudden onset of right eye pain that occurred suddenly when she was in church.  She denies any trauma to the eye.  She states she has never had any history of glaucoma, denies any headache but states that she has had some blurry vision that is intermittent.  She denies any floaters, peripheral vision loss, central vision loss.  She states that she has had stinging burning pain in her right eye.  She states that she is very light sensitive and asked she has light in either eye will hurt her right eye.  She denies any history of welding, UV light exposure.  For chemical exposure.  She denies any foreign body sensation.  She is not a contact lens wearer but wears glasses with corrective bifocals at all times.    Past Medical History:  Diagnosis Date  . Arthritis 02/08/2017  . 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 CAD (coronary artery disease) 09/01/2013  . CHF (congestive heart failure) ([place]) 06/07/2015  . CKD (chronic kidney disease), stage III 12/09/2013  . Depression 07/27/2013  . Diabetes mellitus without complication ([place]) 53/64/6803  . GERD (gastroesophageal reflux disease) 07/27/2013  . Gout 12/29/2013  . Heart valve disorder 06/03/2013  . High cholesterol 11/10/2013  . Hypertension 11/10/2013  . IBS (irritable bowel syndrome) 09/27/2014  . Myocardial infarct ([place])   . Sleep apnea 10/13/2013  . Vascular disorder 01/12/2014    Patient Active Problem List   Diagnosis Date Noted  . Abnormal stress test   . Chest pain 01/05/2020  . Secondary hyperparathyroidism of renal  origin ([place]) 03/20/2018  . Severe obesity (BMI 35.0-39.9) with comorbidity ([place]) 12/19/2017  . CKD (chronic kidney disease) stage 4, GFR 15-29 ml/min (HCC) 06/07/2017  . Acute on chronic systolic (congestive) heart failure ([place])   . CHF (congestive heart failure) ([place]) 02/09/2017  . Pulmonary vascular congestion 02/09/2017  . Hypertension   . Sleep apnea   . Chondrocalcinosis of both knees 02/08/2017  . Primary osteoarthritis of both knees 02/08/2017  . Type 2 diabetes mellitus ([place]) 11/16/2016  . Borderline personality disorder ([place]) 10/17/2016  . Carpal tunnel syndrome 10/17/2016  . 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 Eczema 07/03/2016  . Atrial tachycardia ([place]) 05/31/2016  . 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 Onychomycosis of toenail 06/08/2015  . Cardiomyopathy ([place]) 06/07/2015  . Irritable bowel syndrome 09/27/2014  . PAD (peripheral artery disease) ([place]) 01/12/2014  . History of DVT (deep vein thrombosis) 01/12/2014  . Acute gout of right knee 12/29/2013  . Iron deficiency anemia 12/29/2013  . Lichenification and lichen simplex chronicus 12/26/2013  . Hyperlipidemia associated with type 2 diabetes mellitus ([place]) 11/10/2013  . Benign hypertension with chronic kidney disease, stage IV ([place]) 11/10/2013  . Disorder of magnesium metabolism 10/13/2013  . CAD in native artery 09/01/2013  . Anxiety state 07/27/2013  . Depressive disorder 07/27/2013  . Gastro-esophageal reflux disease without esophagitis 07/27/2013  . Low back pain 06/03/2013  . Mitral valve disease 06/03/2013  . Pulmonary valve disorder 06/03/2013  .  Tricuspid valve disorder 06/03/2013  . 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 Allergic rhinitis 02/07/2013  . 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 Pain in joint 02/07/2013    Past Surgical History:  Procedure Laterality Date  . ABDOMINAL HYSTERECTOMY    .  CARDIAC DEFIBRILLATOR PLACEMENT    . CARPAL TUNNEL RELEASE    . HEEL SPUR EXCISION    . INTRAVASCULAR PRESSURE WIRE/FFR STUDY N/A 01/08/2020   Procedure: INTRAVASCULAR PRESSURE WIRE/FFR STUDY;  Surgeon: Jettie Booze, MD;  Location: [facility];  Service: Cardiovascular;  Laterality: N/A;  . KNEE SURGERY    . LEFT HEART CATH AND CORONARY ANGIOGRAPHY N/A 01/08/2020   Procedure: LEFT HEART CATH AND CORONARY ANGIOGRAPHY;  Surgeon: Jolaine Artist, MD;  Location: [facility];  Service: Cardiovascular;  Laterality: N/A;  . PACEMAKER GENERATOR CHANGE    . RIGHT HEART CATH N/A 02/13/2017   Procedure: Right Heart Cath;  Surgeon: Jolaine Artist, MD;  Location: [facility];  Service: Cardiovascular;  Laterality: N/A;  . SHOULDER SURGERY       OB History   No obstetric history on file.     Family History  Problem Relation Age of Onset  . Hypertension Mother     Social History   Tobacco Use  . Smoking status: Never Smoker  . Smokeless tobacco: Never Used  Substance Use Topics  . Alcohol use: No  . Drug use: No    Home Medications Prior to Admission medications   Medication Sig Start Date End Date Taking? Authorizing Provider  allopurinol (ZYLOPRIM) 100 MG tablet Take 200 mg by mouth daily.    Yes [provider]  aspirin 81 MG chewable tablet Chew 81 mg by mouth daily.   Yes [provider]  atorvastatin (LIPITOR) 80 MG tablet Take 1 tablet (80 mg total) by mouth at bedtime. 01/12/20  Yes Lyda Jester M, PA-C  Calcium Carbonate-Vitamin D 600-400 MG-UNIT tablet Take 1 tablet by mouth in the morning.  07/14/12  Yes [provider]  Calcium Carbonate-Vitamin D3 (CALCIUM 600/VITAMIN D) 600-400 MG-UNIT TABS Take 1 tablet by mouth 2 (two) times daily.    Yes [provider]  carvedilol (COREG) 25 MG tablet Take 1 tablet (25 mg total) by mouth 2 (two) times daily with a meal. 01/12/20  Yes Lyda Jester M, PA-C   colchicine 0.6 MG tablet Take 0.6-1.2 mg by mouth See admin instructions.  12/09/18  Yes [provider]  cyclobenzaprine (FLEXERIL) 5 MG tablet Take 5 mg by mouth 3 (three) times daily as needed for muscle spasms.   Yes [provider]  diclofenac sodium (VOLTAREN) 1 % GEL Apply 2 g topically 4 (four) times daily. Patient taking differently: Apply 2 g topically 2 (two) times daily as needed (arthritis pain).  03/27/18  Yes Caccavale, Sophia, PA-C  ferrous sulfate 325 (65 FE) MG tablet Take 325 mg by mouth 3 (three) times daily.    Yes [provider]  hydrALAZINE (APRESOLINE) 50 MG tablet Take 1.5 tablets (75 mg total) by mouth 3 (three) times daily. 11/05/18  Yes Bensimhon, Shaune Pascal, MD  isosorbide mononitrate (IMDUR) 60 MG 24 hr tablet Take 1 tablet (60 mg total) by mouth daily. 02/16/17  Yes Mariel Aloe, MD  loratadine (CLARITIN) 10 MG tablet Take 10 mg by mouth daily.   Yes [provider]  Magnesium Oxide 420 MG TABS Take 840 mg by mouth daily.   Yes [provider]  omeprazole (  PRILOSEC) 40 MG capsule Take 40 mg by mouth daily.   Yes [provider]  potassium chloride (KLOR-CON) 10 MEQ tablet Take 4 tablets (40 mEq total) by mouth daily. 07/15/19  Yes Clegg, Amy D, NP  rOPINIRole (REQUIP) 0.5 MG tablet Take 0.5 mg by mouth at bedtime.    Yes [provider]  sertraline (ZOLOFT) 100 MG tablet Take 150 mg by mouth at bedtime.    Yes [provider]  torsemide (DEMADEX) 20 MG tablet Take 1 tablet (20 mg total) by mouth 2 (two) times daily. 04/11/17  Yes Shirley Friar, PA-C  TRULICITY 6.57 QI/6.9GE SOPN Inject 0.75 mg into the skin every Sunday.  12/20/19  Yes [provider]  amiodarone (PACERONE) 200 MG tablet Take 0.5 tablets (100 mg total) by mouth daily. 02/02/20   Bensimhon, Shaune Pascal, MD  Investigational - Study Medication Take 1 tablet by mouth 2 (two) times daily. Study name: Galactic Heart Failure  Study Additional study details: Omecamtiv Mecarbil or Placebo Patient not taking: Reported on 11/11/2019 02/15/17   Larey Dresser, MD  Multiple Vitamin (MULTIVITAMIN) tablet Take 1 tablet by mouth daily.    [provider]    Allergies    Codeine, Gabapentin, Haldol [haloperidol lactate], Levofloxacin, Lisinopril, Losartan, Morphine and related, and Penicillins  Review of Systems   Review of Systems  Constitutional: Negative for chills and fever.  HENT: Negative for congestion, ear pain, postnasal drip, rhinorrhea, sinus pain and sore throat.   Eyes: Positive for photophobia, pain, redness and visual disturbance. Negative for discharge.  Respiratory: Negative for cough.   Cardiovascular: Negative for chest pain.  Gastrointestinal: Negative for abdominal pain.  Musculoskeletal: Negative for neck pain.  Skin: Negative for rash.  Neurological: Negative for dizziness and headaches.  Psychiatric/Behavioral: Negative for sleep disturbance.    Physical Exam Updated Vital Signs BP 127/72 (BP Location: Right Arm)   Pulse 74   Temp 98.4 F (36.9 C) (Oral)   Resp 16   Ht 5\' 3"  (1.6 m)   Wt 78.9 kg   SpO2 98%   BMI 30.82 kg/m   Physical Exam Vitals and nursing note reviewed.  Constitutional:      General: She is not in acute distress. HENT:     Head: Normocephalic and atraumatic.     Nose: Nose normal.  Eyes:     General: No scleral icterus.    Comments: Pupils are reactive bilaterally but sluggish in the right eye.  Patient's right eye does appear to be in mid dilation.  Fluorescein stain with no focal uptake.  No evidence of abrasion or ulcer.  Intraocular pressure was 26, 32, 53 in right eye.  Visual acuity in right eye approximately 20/100, somewhat better in left eye.  EOMI, no proptosis or ophthalmoplegia.  Cardiovascular:     Rate and Rhythm: Normal rate and regular rhythm.     Pulses: Normal pulses.     Heart sounds: Normal heart sounds.  Pulmonary:      Effort: Pulmonary effort is normal. No respiratory distress.     Breath sounds: No wheezing.  Abdominal:     Palpations: Abdomen is soft.     Tenderness: There is no abdominal tenderness.  Musculoskeletal:     Cervical back: Normal range of motion.     Right lower leg: No edema.     Left lower leg: No edema.  Skin:    General: Skin is warm and dry.     Capillary Refill: Capillary  refill takes less than 2 seconds.  Neurological:     Mental Status: She is alert. Mental status is at baseline.  Psychiatric:        Mood and Affect: Mood normal.        Behavior: Behavior normal.      ED Results / Procedures / Treatments   Labs (all labs ordered are listed, but only abnormal results are displayed) Labs Reviewed - No data to display  EKG None  Radiology No results found.  Procedures Procedures (including critical care time)  Medications Ordered in ED Medications  tetracaine (PONTOCAINE) 0.5 % ophthalmic solution (2 drops  Given 02/02/20 1058)  fluorescein 1 MG ophthalmic strip (1 strip  Given 02/02/20 1059)    ED Course  I have reviewed the triage vital signs and the nursing notes.  Pertinent labs & imaging results that were available during my care of the patient were reviewed by me and considered in my medical decision making (see chart for details).    MDM Rules/Calculators/A&P                      Patient is a 58 year old female history of CKD and diabetes who presents to ED for right eye pain.  She denies any trauma or UV light exposure, welding or other chemical exposure.  Physical exam is notable for no corneal abrasions.  She does have consistently elevated intraocular pressures of the right eye however denies any nausea or vomiting.  Mild low suspicion for acute angle-closure glaucoma she does have consensual and eccentric photophobia and sluggish reactivity of pupil in the right eye compared to left.  He also has conjunctival irritation.  Most likely uveitis/iritis  however acute angle-closure glaucoma is considered.  Called ophthalmology for urgent referral to clinic.  He will see here in the clinic and recommended discharge and transport to ophthalmology office in [place].   Final Clinical Impression(s) / ED Diagnoses Final diagnoses:  Acute right eye pain    Rx / DC Orders ED Discharge Orders    None       Tedd Sias, [place] 02/02/20 [place], Reese, DO 02/02/20 1454    Lennice Sites, DO 02/02/20 1455

## 2020-02-02 NOTE — ED Notes (Signed)
ED Provider at bedside. 

## 2020-02-03 ENCOUNTER — Other Ambulatory Visit (HOSPITAL_COMMUNITY): Payer: Self-pay

## 2020-02-03 NOTE — Progress Notes (Signed)
Paramedicine Encounter    Patient ID: Michelle Andrade, female    DOB: 10/01/1960, 59 y.o.   MRN: [id number]   Patient Care Team: Henderson Baltimore, [name] as PCP - General (Nurse Practitioner) Haroldine Laws Shaune Pascal, MD as PCP - Cardiology (Cardiology) Jorge Ny, LCSW as Social Worker (Licensed Clinical Social Worker)  Patient Active Problem List   Diagnosis Date Noted  . Abnormal stress test   . Chest pain 01/05/2020  . Secondary hyperparathyroidism of renal origin ([place]) 03/20/2018  . Severe obesity (BMI 35.0-39.9) with comorbidity ([place]) 12/19/2017  . CKD (chronic kidney disease) stage 4, GFR 15-29 ml/min (HCC) 06/07/2017  . Acute on chronic systolic (congestive) heart failure ([place])   . CHF (congestive heart failure) ([place]) 02/09/2017  . Pulmonary vascular congestion 02/09/2017  . Hypertension   . Sleep apnea   . Chondrocalcinosis of both knees 02/08/2017  . Primary osteoarthritis of both knees 02/08/2017  . Type 2 diabetes mellitus ([place]) 11/16/2016  . Borderline personality disorder ([place]) 10/17/2016  . Carpal tunnel syndrome 10/17/2016  . 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 Eczema 07/03/2016  . Atrial tachycardia ([place]) 05/31/2016  . 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 Onychomycosis of toenail 06/08/2015  . Cardiomyopathy ([place]) 06/07/2015  . Irritable bowel syndrome 09/27/2014  . PAD (peripheral artery disease) ([place]) 01/12/2014  . History of DVT (deep vein thrombosis) 01/12/2014  . Acute gout of right knee 12/29/2013  . Iron deficiency anemia 12/29/2013  . Lichenification and lichen simplex chronicus 12/26/2013  . Hyperlipidemia associated with type 2 diabetes mellitus ([place]) 11/10/2013  . Benign hypertension with chronic kidney disease, stage IV ([place]) 11/10/2013  . Disorder of magnesium metabolism 10/13/2013  . CAD in native artery 09/01/2013  . Anxiety state 07/27/2013  .  Depressive disorder 07/27/2013  . Gastro-esophageal reflux disease without esophagitis 07/27/2013  . Low back pain 06/03/2013  . Mitral valve disease 06/03/2013  . Pulmonary valve disorder 06/03/2013  . Tricuspid valve disorder 06/03/2013  . 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 Allergic rhinitis 02/07/2013  . 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 Pain in joint 02/07/2013    Current Outpatient Medications:  .  allopurinol (ZYLOPRIM) 100 MG tablet, Take 200 mg by mouth daily. , Disp: , Rfl:  .  amiodarone (PACERONE) 200 MG tablet, Take 0.5 tablets (100 mg total) by mouth daily., Disp: 90 tablet, Rfl: 3 .  aspirin 81 MG chewable tablet, Chew 81 mg by mouth daily., Disp: , Rfl:  .  atorvastatin (LIPITOR) 80 MG tablet, Take 1 tablet (80 mg total) by mouth at bedtime., Disp: 90 tablet, Rfl: 3 .  Calcium Carbonate-Vitamin D 600-400 MG-UNIT tablet, Take 1 tablet by mouth in the morning. , Disp: , Rfl:  .  carvedilol (COREG) 25 MG tablet, Take 1 tablet (25 mg total) by mouth 2 (two) times daily with a meal., Disp: 60 tablet, Rfl: 3 .  colchicine 0.6 MG tablet, Take 0.6-1.2 mg by mouth See admin instructions. , Disp: , Rfl:  .  cyclobenzaprine (FLEXERIL) 5 MG tablet, Take 5 mg by mouth 3 (three) times daily as needed for muscle spasms., Disp: , Rfl:  .  diclofenac sodium (VOLTAREN) 1 % GEL, Apply 2 g topically 4 (four) times daily. (Patient taking differently: Apply 2 g topically 2 (two) times daily as needed (arthritis pain). ), Disp: 100 g, Rfl: 0 .  ferrous sulfate 325 (65 FE) MG tablet, Take 325 mg by mouth 3 (three) times  daily. , Disp: , Rfl:  .  hydrALAZINE (APRESOLINE) 50 MG tablet, Take 1.5 tablets (75 mg total) by mouth 3 (three) times daily., Disp: 405 tablet, Rfl: 3 .  isosorbide mononitrate (IMDUR) 60 MG 24 hr tablet, Take 1 tablet (60 mg total) by mouth daily., Disp: 30 tablet, Rfl: 0 .  loratadine (CLARITIN) 10 MG tablet, Take 10 mg by mouth daily., Disp: , Rfl:  .  Magnesium Oxide  420 MG TABS, Take 840 mg by mouth daily., Disp: , Rfl:  .  Multiple Vitamin (MULTIVITAMIN) tablet, Take 1 tablet by mouth daily., Disp: , Rfl:  .  omeprazole (PRILOSEC) 40 MG capsule, Take 40 mg by mouth daily., Disp: , Rfl:  .  potassium chloride (KLOR-CON) 10 MEQ tablet, Take 4 tablets (40 mEq total) by mouth daily., Disp: 240 tablet, Rfl: 11 .  rOPINIRole (REQUIP) 0.5 MG tablet, Take 0.5 mg by mouth at bedtime. , Disp: , Rfl:  .  sertraline (ZOLOFT) 100 MG tablet, Take 150 mg by mouth at bedtime. , Disp: , Rfl:  .  torsemide (DEMADEX) 20 MG tablet, Take 1 tablet (20 mg total) by mouth 2 (two) times daily., Disp: 120 tablet, Rfl: 3 .  TRULICITY 8.14 GY/1.8HU SOPN, Inject 0.75 mg into the skin every Sunday. , Disp: , Rfl:  .  Calcium Carbonate-Vitamin D3 (CALCIUM 600/VITAMIN D) 600-400 MG-UNIT TABS, Take 1 tablet by mouth 2 (two) times daily. , Disp: , Rfl:  .  Investigational - Study Medication, Take 1 tablet by mouth 2 (two) times daily. Study name: Galactic Heart Failure Study Additional study details: Omecamtiv Mecarbil or Placebo (Patient not taking: Reported on 02/03/2020), Disp: 1 each, Rfl: PRN Allergies  Allergen Reactions  . Codeine Itching  . Gabapentin Itching  . Haldol [Haloperidol Lactate] Other (See Comments)    Dizziness, hallucinations  . Levofloxacin     Other reaction(s): Other (See Comments) Projectile vomiting   . Lisinopril Itching and Swelling  . Losartan Itching  . Morphine And Related Nausea And Vomiting  . Penicillins Nausea And Vomiting    "Vomiting, upset stomach, Headache" Has patient had a PCN reaction causing immediate rash, facial/tongue/throat swelling, SOB or lightheadedness with hypotension: No Has patient had a PCN reaction causing severe rash involving mucus membranes or skin necrosis: No Has patient had a PCN reaction that required hospitalization: No Has patient had a PCN reaction occurring within the last 10 years: No If all of the above answers are  "NO", then may proceed with Cephalosporin use.       Social History   Socioeconomic History  . Marital status: Single    Spouse name: Not on file  . Number of children: Not on file  . Years of education: Not on file  . Highest education level: Not on file  Occupational History  . Not on file  Tobacco Use  . Smoking status: Never Smoker  . Smokeless tobacco: Never Used  Substance and Sexual Activity  . Alcohol use: No  . Drug use: No  . Sexual activity: Yes  Other Topics Concern  . Not on file  Social History Narrative  . Not on file   Social Determinants of Health   Financial Resource Strain:   . Difficulty of Paying Living Expenses:   Food Insecurity:   . Worried About Charity fundraiser in the Last Year:   . Arboriculturist in the Last Year:   Transportation Needs:   . Film/video editor (Medical):   [name]  Lack of Transportation (Non-Medical):   Physical Activity:   . Days of Exercise per Week:   . Minutes of Exercise per Session:   Stress:   . Feeling of Stress :   Social Connections:   . Frequency of Communication with Friends and Family:   . Frequency of Social Gatherings with Friends and Family:   . Attends Religious Services:   . Active Member of Clubs or Organizations:   . Attends Archivist Meetings:   [name] Marital Status:   Intimate Partner Violence:   . Fear of Current or Ex-Partner:   . Emotionally Abused:   [name] Physically Abused:   . Sexually Abused:     Physical Exam Cardiovascular:     Rate and Rhythm: Normal rate and regular rhythm.     Pulses: Normal pulses.  Pulmonary:     Effort: Pulmonary effort is normal.     Breath sounds: Normal breath sounds.  Musculoskeletal:        General: Normal range of motion.     Right lower leg: No edema.     Left lower leg: No edema.  Skin:    General: Skin is warm and dry.     Capillary Refill: Capillary refill takes less than 2 seconds.  Neurological:     Mental Status: She is alert and  oriented to person, place, and time.  Psychiatric:        Mood and Affect: Mood normal.         Future Appointments  Date Time Provider [place]  02/11/2020  9:30 AM [facility] DEVICE REMOTES [facility]  03/14/2020  9:30 AM [facility] DEVICE REMOTES [facility]  03/15/2020 11:00 AM [facility] LAB [facility] None  04/14/2020  9:30 AM [facility] DEVICE REMOTES [facility]  05/16/2020  9:30 AM [facility] DEVICE REMOTES [facility]  06/16/2020  9:30 AM [facility] DEVICE REMOTES [facility]  07/18/2020  9:30 AM [facility] DEVICE REMOTES [facility]  08/18/2020  9:30 AM [facility] DEVICE REMOTES [facility]  09/19/2020  9:30 AM [facility] DEVICE REMOTES [facility]  10/20/2020  9:30 AM [facility] DEVICE REMOTES [facility]  11/21/2020  9:30 AM [facility] DEVICE REMOTES [facility]  12/22/2020  9:30 AM [facility] DEVICE REMOTES [facility]    BP 120/60 (BP Location: Left Arm, Patient Position: Sitting, Cuff Size: Normal)   Pulse 75   Resp 18   Wt 173 lb 12.8 oz (78.8 kg)   SpO2 97%   BMI 30.79 kg/m   Weight yesterday- did not weigh Last visit weight- 174 lb  Ms Narvaiz was seen at home today and reported feeling well. She denied chest pain, SOB, headache, dizziness, orthopnea, fever or cough over the past two weeks. Her only complaint at present is pain in her right eye from an infection but it is being treated by opthomology. She reported being compliant with her medications over the past two weeks and her weight has been stable. Her medications were verified and her pillbox was refilled. I will follow up in two weeks.   [name], EMT 02/03/20  ACTION: Home visit completed Next visit planned for 2 weeks

## 2020-02-11 ENCOUNTER — Ambulatory Visit (INDEPENDENT_AMBULATORY_CARE_PROVIDER_SITE_OTHER): Payer: Medicare Other | Admitting: *Deleted

## 2020-02-11 DIAGNOSIS — I429 Cardiomyopathy, unspecified: Secondary | ICD-10-CM

## 2020-02-11 LAB — CUP PACEART REMOTE DEVICE CHECK
Battery Remaining Longevity: 6 mo
Battery Remaining Percentage: 7 %
Battery Voltage: 2.66 V
Brady Statistic AP VP Percent: 12 %
Brady Statistic AP VS Percent: 1 %
Brady Statistic AS VP Percent: 86 %
Brady Statistic AS VS Percent: 1.8 %
Brady Statistic RA Percent Paced: 12 %
Date Time Interrogation Session: 20210610040016
HighPow Impedance: 66 Ohm
HighPow Impedance: 66 Ohm
Implantable Pulse Generator Implant Date: 20150915
Lead Channel Impedance Value: 300 Ohm
Lead Channel Impedance Value: 390 Ohm
Lead Channel Impedance Value: 410 Ohm
Lead Channel Pacing Threshold Amplitude: 0.5 V
Lead Channel Pacing Threshold Amplitude: 1 V
Lead Channel Pacing Threshold Amplitude: 1.5 V
Lead Channel Pacing Threshold Pulse Width: 0.5 ms
Lead Channel Pacing Threshold Pulse Width: 0.5 ms
Lead Channel Pacing Threshold Pulse Width: 1 ms
Lead Channel Sensing Intrinsic Amplitude: 11.7 mV
Lead Channel Sensing Intrinsic Amplitude: 2.4 mV
Lead Channel Setting Pacing Amplitude: 2 V
Lead Channel Setting Pacing Amplitude: 2 V
Lead Channel Setting Pacing Amplitude: 2.5 V
Lead Channel Setting Pacing Pulse Width: 0.5 ms
Lead Channel Setting Pacing Pulse Width: 1 ms
Lead Channel Setting Sensing Sensitivity: 0.5 mV
Pulse Gen Serial Number: 7186553

## 2020-02-12 ENCOUNTER — Other Ambulatory Visit: Payer: Self-pay

## 2020-02-12 NOTE — Progress Notes (Signed)
Remote ICD transmission.   

## 2020-02-12 NOTE — Telephone Encounter (Signed)
This medication was prescribed in the hospital. This is Dr. Clayborne Dana pt. Please address

## 2020-02-17 ENCOUNTER — Other Ambulatory Visit (HOSPITAL_COMMUNITY): Payer: Self-pay

## 2020-02-17 ENCOUNTER — Other Ambulatory Visit (HOSPITAL_COMMUNITY): Payer: Self-pay | Admitting: *Deleted

## 2020-02-17 MED ORDER — HYDRALAZINE HCL 50 MG PO TABS: 75.0000 mg | ORAL_TABLET | Freq: Three times a day (TID) | ORAL | 0 refills | Status: DC

## 2020-02-17 NOTE — Progress Notes (Signed)
Paramedicine Encounter    Patient ID: Michelle Andrade, female    DOB: 1960/09/29, 59 y.o.   MRN: [id number]   Patient Care Team: Henderson Baltimore, [name] as PCP - General (Nurse Practitioner) Haroldine Laws Shaune Pascal, MD as PCP - Cardiology (Cardiology) Jorge Ny, LCSW as Social Worker (Licensed Clinical Social Worker)  Patient Active Problem List   Diagnosis Date Noted  . Abnormal stress test   . Chest pain 01/05/2020  . Secondary hyperparathyroidism of renal origin ([place]) 03/20/2018  . Severe obesity (BMI 35.0-39.9) with comorbidity ([place]) 12/19/2017  . CKD (chronic kidney disease) stage 4, GFR 15-29 ml/min (HCC) 06/07/2017  . Acute on chronic systolic (congestive) heart failure ([place])   . CHF (congestive heart failure) ([place]) 02/09/2017  . Pulmonary vascular congestion 02/09/2017  . Hypertension   . Sleep apnea   . Chondrocalcinosis of both knees 02/08/2017  . Primary osteoarthritis of both knees 02/08/2017  . Type 2 diabetes mellitus ([place]) 11/16/2016  . Borderline personality disorder ([place]) 10/17/2016  . Carpal tunnel syndrome 10/17/2016  . 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 Eczema 07/03/2016  . Atrial tachycardia ([place]) 05/31/2016  . 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 Onychomycosis of toenail 06/08/2015  . Cardiomyopathy ([place]) 06/07/2015  . Irritable bowel syndrome 09/27/2014  . PAD (peripheral artery disease) ([place]) 01/12/2014  . History of DVT (deep vein thrombosis) 01/12/2014  . Acute gout of right knee 12/29/2013  . Iron deficiency anemia 12/29/2013  . Lichenification and lichen simplex chronicus 12/26/2013  . Hyperlipidemia associated with type 2 diabetes mellitus ([place]) 11/10/2013  . Benign hypertension with chronic kidney disease, stage IV ([place]) 11/10/2013  . Disorder of magnesium metabolism 10/13/2013  . CAD in native artery 09/01/2013  . Anxiety state 07/27/2013  .  Depressive disorder 07/27/2013  . Gastro-esophageal reflux disease without esophagitis 07/27/2013  . Low back pain 06/03/2013  . Mitral valve disease 06/03/2013  . Pulmonary valve disorder 06/03/2013  . Tricuspid valve disorder 06/03/2013  . 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 Allergic rhinitis 02/07/2013  . 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 Pain in joint 02/07/2013    Current Outpatient Medications:  .  allopurinol (ZYLOPRIM) 100 MG tablet, Take 200 mg by mouth daily. , Disp: , Rfl:  .  amiodarone (PACERONE) 200 MG tablet, Take 0.5 tablets (100 mg total) by mouth daily., Disp: 90 tablet, Rfl: 3 .  aspirin 81 MG chewable tablet, Chew 81 mg by mouth daily., Disp: , Rfl:  .  atorvastatin (LIPITOR) 80 MG tablet, Take 1 tablet (80 mg total) by mouth at bedtime., Disp: 90 tablet, Rfl: 3 .  Calcium Carbonate-Vitamin D 600-400 MG-UNIT tablet, Take 1 tablet by mouth in the morning. , Disp: , Rfl:  .  Calcium Carbonate-Vitamin D3 (CALCIUM 600/VITAMIN D) 600-400 MG-UNIT TABS, Take 1 tablet by mouth 2 (two) times daily. , Disp: , Rfl:  .  carvedilol (COREG) 25 MG tablet, Take 1 tablet (25 mg total) by mouth 2 (two) times daily with a meal., Disp: 60 tablet, Rfl: 3 .  colchicine 0.6 MG tablet, Take 0.6-1.2 mg by mouth See admin instructions. , Disp: , Rfl:  .  cyclobenzaprine (FLEXERIL) 5 MG tablet, Take 5 mg by mouth 3 (three) times daily as needed for muscle spasms., Disp: , Rfl:  .  diclofenac sodium (VOLTAREN) 1 % GEL, Apply 2 g topically 4 (four) times daily. (Patient taking differently: Apply 2 g topically 2 (two) times daily as needed (  arthritis pain). ), Disp: 100 g, Rfl: 0 .  ferrous sulfate 325 (65 FE) MG tablet, Take 325 mg by mouth 3 (three) times daily. , Disp: , Rfl:  .  hydrALAZINE (APRESOLINE) 50 MG tablet, Take 1.5 tablets (75 mg total) by mouth 3 (three) times daily., Disp: 405 tablet, Rfl: 3 .  Investigational - Study Medication, Take 1 tablet by mouth 2 (two) times daily.  Study name: Galactic Heart Failure Study Additional study details: Omecamtiv Mecarbil or Placebo (Patient not taking: Reported on 02/03/2020), Disp: 1 each, Rfl: PRN .  isosorbide mononitrate (IMDUR) 60 MG 24 hr tablet, Take 1 tablet (60 mg total) by mouth daily., Disp: 30 tablet, Rfl: 0 .  loratadine (CLARITIN) 10 MG tablet, Take 10 mg by mouth daily., Disp: , Rfl:  .  Magnesium Oxide 420 MG TABS, Take 840 mg by mouth daily., Disp: , Rfl:  .  Multiple Vitamin (MULTIVITAMIN) tablet, Take 1 tablet by mouth daily., Disp: , Rfl:  .  omeprazole (PRILOSEC) 40 MG capsule, Take 40 mg by mouth daily., Disp: , Rfl:  .  potassium chloride (KLOR-CON) 10 MEQ tablet, Take 4 tablets (40 mEq total) by mouth daily., Disp: 240 tablet, Rfl: 11 .  rOPINIRole (REQUIP) 0.5 MG tablet, Take 0.5 mg by mouth at bedtime. , Disp: , Rfl:  .  sertraline (ZOLOFT) 100 MG tablet, Take 150 mg by mouth at bedtime. , Disp: , Rfl:  .  torsemide (DEMADEX) 20 MG tablet, Take 1 tablet (20 mg total) by mouth 2 (two) times daily., Disp: 120 tablet, Rfl: 3 .  TRULICITY 2.95 JO/8.4ZY SOPN, Inject 0.75 mg into the skin every Sunday. , Disp: , Rfl:  Allergies  Allergen Reactions  . Codeine Itching  . Gabapentin Itching  . Haldol [Haloperidol Lactate] Other (See Comments)    Dizziness, hallucinations  . Levofloxacin     Other reaction(s): Other (See Comments) Projectile vomiting   . Lisinopril Itching and Swelling  . Losartan Itching  . Morphine And Related Nausea And Vomiting  . Penicillins Nausea And Vomiting    "Vomiting, upset stomach, Headache" Has patient had a PCN reaction causing immediate rash, facial/tongue/throat swelling, SOB or lightheadedness with hypotension: No Has patient had a PCN reaction causing severe rash involving mucus membranes or skin necrosis: No Has patient had a PCN reaction that required hospitalization: No Has patient had a PCN reaction occurring within the last 10 years: No If all of the above answers  are "NO", then may proceed with Cephalosporin use.       Social History   Socioeconomic History  . Marital status: Single    Spouse name: Not on file  . Number of children: Not on file  . Years of education: Not on file  . Highest education level: Not on file  Occupational History  . Not on file  Tobacco Use  . Smoking status: Never Smoker  . Smokeless tobacco: Never Used  Vaping Use  . Vaping Use: Never used  Substance and Sexual Activity  . Alcohol use: No  . Drug use: No  . Sexual activity: Yes  Other Topics Concern  . Not on file  Social History Narrative  . Not on file   Social Determinants of Health   Financial Resource Strain:   . Difficulty of Paying Living Expenses:   Food Insecurity:   . Worried About Charity fundraiser in the Last Year:   . Arboriculturist in the Last Year:   [employer]  Needs:   . Lack of Transportation (Medical):   [name] Lack of Transportation (Non-Medical):   Physical Activity:   . Days of Exercise per Week:   . Minutes of Exercise per Session:   Stress:   . Feeling of Stress :   Social Connections:   . Frequency of Communication with Friends and Family:   . Frequency of Social Gatherings with Friends and Family:   . Attends Religious Services:   . Active Member of Clubs or Organizations:   . Attends Archivist Meetings:   [name] Marital Status:   Intimate Partner Violence:   . Fear of Current or Ex-Partner:   . Emotionally Abused:   [name] Physically Abused:   . Sexually Abused:     Physical Exam Cardiovascular:     Rate and Rhythm: Normal rate and regular rhythm.     Pulses: Normal pulses.  Pulmonary:     Effort: Pulmonary effort is normal.     Breath sounds: Normal breath sounds.  Musculoskeletal:        General: Normal range of motion.     Right lower leg: No edema.     Left lower leg: No edema.  Skin:    General: Skin is warm and dry.     Capillary Refill: Capillary refill takes less than 2 seconds.   Neurological:     Mental Status: She is alert and oriented to person, place, and time.  Psychiatric:        Mood and Affect: Mood normal.         Future Appointments  Date Time Provider [place]  03/14/2020  9:30 AM [facility] DEVICE REMOTES [facility]  03/16/2020 10:00 AM [facility] LAB [facility] None  04/14/2020  9:30 AM [facility] DEVICE REMOTES [facility]  05/16/2020  9:30 AM [facility] DEVICE REMOTES [facility]  06/16/2020  9:30 AM [facility]ICE [facility]OTES [facility]  07/18/2020  9:30 AM [facility] DEVICE REMOTES [facility]  08/18/2020  9:30 AM [facility] DEVICE REMOTES [facility]  09/19/2020  9:30 AM [facility] DEVICE REMOTES [facility]  10/20/2020  9:30 AM [facility] DEVICE REMOTES [facility]  11/21/2020  9:30 AM [facility] DEVICE REMOTES [facility]  12/22/2020  9:30 AM [facility] DEVICE REMOTES [facility]    BP 110/70 (BP Location: Left Arm, Patient Position: Sitting, Cuff Size: Normal)   Pulse 72   Resp 18   Wt 173 lb 6.4 oz (78.7 kg)   SpO2 97%   BMI 30.72 kg/m   Weight yesterday- 174.6 lb Last visit weight- 173.8 lb  Ms Bugh was seen at home today and reported feeling well. She denied chest pain, SOB, headache, dizziness, orthopnea, fever or cough over the past two weeks. She reported being compliant with her medications over the past two weeks and her weight has been stable. Her medications were verified and her pillbox was refilled. I will follow up in two weeks.   [name], EMT 02/17/20  ACTION: Home visit completed Next visit planned for 2 weeks

## 2020-02-18 ENCOUNTER — Other Ambulatory Visit (HOSPITAL_COMMUNITY): Payer: Self-pay | Admitting: *Deleted

## 2020-02-18 MED ORDER — AMIODARONE HCL 200 MG PO TABS: 100.0000 mg | ORAL_TABLET | Freq: Every day | ORAL | 3 refills | Status: DC

## 2020-02-18 NOTE — Telephone Encounter (Signed)
Pt's medication still has not been sent to pt's pharmacy as requested. Pt needs medication sent to a different pharmacy, it's been 2 weeks. Please address

## 2020-02-19 MED ORDER — CARVEDILOL 25 MG PO TABS: 25.0000 mg | ORAL_TABLET | Freq: Two times a day (BID) | ORAL | 3 refills | Status: AC

## 2020-02-19 NOTE — Telephone Encounter (Signed)
This medication is carvedilol 25 mg tablet and the pharmacy is [facility] mail order pharmacy, the first Rx was sent to [facility]. Thanks

## 2020-02-19 NOTE — Telephone Encounter (Signed)
What medication needs to be sent in and what pharmacy does it need to be sent to?

## 2020-02-22 NOTE — Telephone Encounter (Signed)
Pt's medication has already been sent to pt's pharmacy as requested. Confirmation received.  

## 2020-02-22 NOTE — Telephone Encounter (Signed)
Hey what med was it?

## 2020-03-01 ENCOUNTER — Telehealth (HOSPITAL_COMMUNITY): Payer: Self-pay

## 2020-03-01 NOTE — Telephone Encounter (Signed)
I called Ms Wanner to schedule an appointment. She stated she would be home all day sp for me to come anytime. I advised I would see her around lunch time. She was agreeable.   [name], EMT 03/01/20

## 2020-03-02 ENCOUNTER — Other Ambulatory Visit (HOSPITAL_COMMUNITY): Payer: Self-pay

## 2020-03-02 NOTE — Progress Notes (Signed)
Paramedicine Encounter    Patient ID: Michelle Andrade, female    DOB: July 28, 1961, 59 y.o.   MRN: [id number]   Patient Care Team: Henderson Baltimore, [name] as PCP - General (Nurse Practitioner) Haroldine Laws Shaune Pascal, MD as PCP - Cardiology (Cardiology) Jorge Ny, LCSW as Social Worker (Licensed Clinical Social Worker)  Patient Active Problem List   Diagnosis Date Noted  . Abnormal stress test   . Chest pain 01/05/2020  . Secondary hyperparathyroidism of renal origin ([place]) 03/20/2018  . Severe obesity (BMI 35.0-39.9) with comorbidity ([place]) 12/19/2017  . CKD (chronic kidney disease) stage 4, GFR 15-29 ml/min (HCC) 06/07/2017  . Acute on chronic systolic (congestive) heart failure ([place])   . CHF (congestive heart failure) ([place]) 02/09/2017  . Pulmonary vascular congestion 02/09/2017  . Hypertension   . Sleep apnea   . Chondrocalcinosis of both knees 02/08/2017  . Primary osteoarthritis of both knees 02/08/2017  . Type 2 diabetes mellitus ([place]) 11/16/2016  . Borderline personality disorder ([place]) 10/17/2016  . Carpal tunnel syndrome 10/17/2016  . 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 Eczema 07/03/2016  . Atrial tachycardia ([place]) 05/31/2016  . 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 Onychomycosis of toenail 06/08/2015  . Cardiomyopathy ([place]) 06/07/2015  . Irritable bowel syndrome 09/27/2014  . PAD (peripheral artery disease) ([place]) 01/12/2014  . History of DVT (deep vein thrombosis) 01/12/2014  . Acute gout of right knee 12/29/2013  . Iron deficiency anemia 12/29/2013  . Lichenification and lichen simplex chronicus 12/26/2013  . Hyperlipidemia associated with type 2 diabetes mellitus ([place]) 11/10/2013  . Benign hypertension with chronic kidney disease, stage IV ([place]) 11/10/2013  . Disorder of magnesium metabolism 10/13/2013  . CAD in native artery 09/01/2013  . Anxiety state 07/27/2013  .  Depressive disorder 07/27/2013  . Gastro-esophageal reflux disease without esophagitis 07/27/2013  . Low back pain 06/03/2013  . Mitral valve disease 06/03/2013  . Pulmonary valve disorder 06/03/2013  . Tricuspid valve disorder 06/03/2013  . 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 Allergic rhinitis 02/07/2013  . 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 Pain in joint 02/07/2013    Current Outpatient Medications:  .  allopurinol (ZYLOPRIM) 100 MG tablet, Take 200 mg by mouth daily. , Disp: , Rfl:  .  amiodarone (PACERONE) 200 MG tablet, Take 0.5 tablets (100 mg total) by mouth daily., Disp: 90 tablet, Rfl: 3 .  aspirin 81 MG chewable tablet, Chew 81 mg by mouth daily., Disp: , Rfl:  .  atorvastatin (LIPITOR) 80 MG tablet, Take 1 tablet (80 mg total) by mouth at bedtime., Disp: 90 tablet, Rfl: 3 .  Calcium Carbonate-Vitamin D3 (CALCIUM 600/VITAMIN D) 600-400 MG-UNIT TABS, Take 1 tablet by mouth 2 (two) times daily. , Disp: , Rfl:  .  carvedilol (COREG) 25 MG tablet, Take 1 tablet (25 mg total) by mouth 2 (two) times daily with a meal., Disp: 180 tablet, Rfl: 3 .  cyclobenzaprine (FLEXERIL) 5 MG tablet, Take 5 mg by mouth 3 (three) times daily as needed for muscle spasms., Disp: , Rfl:  .  diclofenac sodium (VOLTAREN) 1 % GEL, Apply 2 g topically 4 (four) times daily., Disp: 100 g, Rfl: 0 .  ferrous sulfate 325 (65 FE) MG tablet, Take 325 mg by mouth 3 (three) times daily. , Disp: , Rfl:  .  hydrALAZINE (APRESOLINE) 50 MG tablet, Take 1.5 tablets (75 mg total) by mouth 3 (three) times daily., Disp: 135 tablet, Rfl: 0 .  isosorbide mononitrate (IMDUR) 60 MG 24 hr tablet, Take 1 tablet (60 mg total) by mouth daily., Disp: 30 tablet, Rfl: 0 .  loratadine (CLARITIN) 10 MG tablet, Take 10 mg by mouth daily., Disp: , Rfl:  .  Magnesium Oxide 420 MG TABS, Take 840 mg by mouth daily., Disp: , Rfl:  .  Multiple Vitamin (MULTIVITAMIN) tablet, Take 1 tablet by mouth daily., Disp: , Rfl:  .  omeprazole  (PRILOSEC) 40 MG capsule, Take 40 mg by mouth daily., Disp: , Rfl:  .  potassium chloride (KLOR-CON) 10 MEQ tablet, Take 4 tablets (40 mEq total) by mouth daily., Disp: 240 tablet, Rfl: 11 .  rOPINIRole (REQUIP) 0.5 MG tablet, Take 0.5 mg by mouth at bedtime. , Disp: , Rfl:  .  sertraline (ZOLOFT) 100 MG tablet, Take 150 mg by mouth at bedtime. , Disp: , Rfl:  .  torsemide (DEMADEX) 20 MG tablet, Take 1 tablet (20 mg total) by mouth 2 (two) times daily., Disp: 120 tablet, Rfl: 3 .  TRULICITY 2.37 SE/8.3TD SOPN, Inject 0.75 mg into the skin every Sunday. , Disp: , Rfl:  .  Calcium Carbonate-Vitamin D 600-400 MG-UNIT tablet, Take 1 tablet by mouth in the morning. , Disp: , Rfl:  .  colchicine 0.6 MG tablet, Take 0.6-1.2 mg by mouth See admin instructions.  (Patient not taking: Reported on 03/02/2020), Disp: , Rfl:  .  Investigational - Study Medication, Take 1 tablet by mouth 2 (two) times daily. Study name: Galactic Heart Failure Study Additional study details: Omecamtiv Mecarbil or Placebo (Patient not taking: Reported on 02/03/2020), Disp: 1 each, Rfl: PRN Allergies  Allergen Reactions  . Codeine Itching  . Gabapentin Itching  . Haldol [Haloperidol Lactate] Other (See Comments)    Dizziness, hallucinations  . Levofloxacin     Other reaction(s): Other (See Comments) Projectile vomiting   . Lisinopril Itching and Swelling  . Losartan Itching  . Morphine And Related Nausea And Vomiting  . Penicillins Nausea And Vomiting    "Vomiting, upset stomach, Headache" Has patient had a PCN reaction causing immediate rash, facial/tongue/throat swelling, SOB or lightheadedness with hypotension: No Has patient had a PCN reaction causing severe rash involving mucus membranes or skin necrosis: No Has patient had a PCN reaction that required hospitalization: No Has patient had a PCN reaction occurring within the last 10 years: No If all of the above answers are "NO", then may proceed with Cephalosporin use.        Social History   Socioeconomic History  . Marital status: Single    Spouse name: Not on file  . Number of children: Not on file  . Years of education: Not on file  . Highest education level: Not on file  Occupational History  . Not on file  Tobacco Use  . Smoking status: Never Smoker  . Smokeless tobacco: Never Used  Vaping Use  . Vaping Use: Never used  Substance and Sexual Activity  . Alcohol use: No  . Drug use: No  . Sexual activity: Yes  Other Topics Concern  . Not on file  Social History Narrative  . Not on file   Social Determinants of Health   Financial Resource Strain:   . Difficulty of Paying Living Expenses:   Food Insecurity:   . Worried About Charity fundraiser in the Last Year:   . Arboriculturist in the Last Year:   Transportation Needs:   . Film/video editor (Medical):   [name]  Lack of Transportation (Non-Medical):   Physical Activity:   . Days of Exercise per Week:   . Minutes of Exercise per Session:   Stress:   . Feeling of Stress :   Social Connections:   . Frequency of Communication with Friends and Family:   . Frequency of Social Gatherings with Friends and Family:   . Attends Religious Services:   . Active Member of Clubs or Organizations:   . Attends Archivist Meetings:   [name] Marital Status:   Intimate Partner Violence:   . Fear of Current or Ex-Partner:   . Emotionally Abused:   [name] Physically Abused:   . Sexually Abused:     Physical Exam Cardiovascular:     Rate and Rhythm: Normal rate and regular rhythm.     Pulses: Normal pulses.  Pulmonary:     Effort: Pulmonary effort is normal.     Breath sounds: Normal breath sounds.  Musculoskeletal:        General: Normal range of motion.     Right lower leg: No edema.     Left lower leg: No edema.  Skin:    General: Skin is warm and dry.     Capillary Refill: Capillary refill takes less than 2 seconds.  Neurological:     Mental Status: She is alert and oriented  to person, place, and time.  Psychiatric:        Mood and Affect: Mood normal.         Future Appointments  Date Time Provider [place]  03/14/2020  9:30 AM [facility] DEVICE REMOTES [facility]  03/16/2020 10:00 AM [facility] LAB [facility] None  04/14/2020  9:30 AM [facility] DEVICE REMOTES [facility]  05/16/2020  9:30 AM [facility] DEVICE REMOTES [facility]  06/16/2020  9:30 AM [facility] DEVICE REMOTES [facility]  07/18/2020  9:30 AM [facility] DEVICE REMOTES [facility]  08/18/2020  9:30 AM [facility] DEVICE REMOTES [facility]  09/19/2020  9:30 AM [facility] DEVICE REMOTES [facility]  10/20/2020  9:30 AM [facility] DEVICE REMOTES [facility]  11/21/2020  9:30 AM [facility] DEVICE REMOTES [facility]  12/22/2020  9:30 AM [facility] DEVICE REMOTES [facility]    BP 120/72 (BP Location: Right Arm, Patient Position: Sitting, Cuff Size: Normal)   Pulse 88   Resp 18   Wt 171 lb 9.6 oz (77.8 kg)   SpO2 98%   BMI 30.40 kg/m   Weight yesterday- 171.4 lb Last visit weight- 173.3 lb  Ms Ragsdale was seen at home today and reported feeling well. She denied chest pain, SOB, headache, dizziness, orthopnea, fever or cough over the past week. She sttaed she has been compliant with her medications over the past week and her weight has been stable. Her medications were verified and her pillbox was refilled. I will follow up in two weeks.   [name], EMT 03/02/20  ACTION: Home visit completed Next visit planned for 2 weeks

## 2020-03-14 ENCOUNTER — Ambulatory Visit (INDEPENDENT_AMBULATORY_CARE_PROVIDER_SITE_OTHER): Payer: Medicare Other | Admitting: *Deleted

## 2020-03-14 DIAGNOSIS — I429 Cardiomyopathy, unspecified: Secondary | ICD-10-CM

## 2020-03-14 LAB — CUP PACEART REMOTE DEVICE CHECK
Battery Remaining Longevity: 6 mo
Battery Remaining Percentage: 7 %
Battery Voltage: 2.65 V
Brady Statistic AP VP Percent: 12 %
Brady Statistic AP VS Percent: 1 %
Brady Statistic AS VP Percent: 84 %
Brady Statistic AS VS Percent: 3.2 %
Brady Statistic RA Percent Paced: 12 %
Date Time Interrogation Session: 20210712081020
HighPow Impedance: 69 Ohm
HighPow Impedance: 69 Ohm
Implantable Pulse Generator Implant Date: 20150915
Lead Channel Impedance Value: 310 Ohm
Lead Channel Impedance Value: 390 Ohm
Lead Channel Impedance Value: 430 Ohm
Lead Channel Pacing Threshold Amplitude: 0.5 V
Lead Channel Pacing Threshold Amplitude: 1.5 V
Lead Channel Pacing Threshold Amplitude: 2 V
Lead Channel Pacing Threshold Pulse Width: 0.2 ms
Lead Channel Pacing Threshold Pulse Width: 0.5 ms
Lead Channel Pacing Threshold Pulse Width: 0.5 ms
Lead Channel Sensing Intrinsic Amplitude: 11.7 mV
Lead Channel Sensing Intrinsic Amplitude: 2.2 mV
Lead Channel Setting Pacing Amplitude: 2 V
Lead Channel Setting Pacing Amplitude: 2 V
Lead Channel Setting Pacing Amplitude: 2.5 V
Lead Channel Setting Pacing Pulse Width: 0.5 ms
Lead Channel Setting Pacing Pulse Width: 0.6 ms
Lead Channel Setting Sensing Sensitivity: 0.5 mV
Pulse Gen Serial Number: 7186553

## 2020-03-15 ENCOUNTER — Other Ambulatory Visit (HOSPITAL_COMMUNITY): Payer: Medicare Other

## 2020-03-15 NOTE — Progress Notes (Signed)
Remote ICD transmission.   

## 2020-03-16 ENCOUNTER — Ambulatory Visit (HOSPITAL_COMMUNITY)
Admission: RE | Admit: 2020-03-16 | Discharge: 2020-03-16 | Disposition: A | Discharge status: Home / Self Care | Payer: Medicare Other | Source: Ambulatory Visit | Attending: Cardiology | Admitting: Cardiology

## 2020-03-16 ENCOUNTER — Other Ambulatory Visit (HOSPITAL_COMMUNITY): Payer: Self-pay

## 2020-03-16 ENCOUNTER — Other Ambulatory Visit: Payer: Self-pay

## 2020-03-16 DIAGNOSIS — I5022 Chronic systolic (congestive) heart failure: Secondary | ICD-10-CM | POA: Insufficient documentation

## 2020-03-16 LAB — HEPATIC FUNCTION PANEL
ALT: 53 U/L — ABNORMAL HIGH (ref 0–44)
AST: 59 U/L — ABNORMAL HIGH (ref 15–41)
Albumin: 3.2 g/dL — ABNORMAL LOW (ref 3.5–5.0)
Alkaline Phosphatase: 108 U/L (ref 38–126)
Bilirubin, Direct: 0.1 mg/dL (ref 0.0–0.2)
Indirect Bilirubin: 0.5 mg/dL (ref 0.3–0.9)
Total Bilirubin: 0.6 mg/dL (ref 0.3–1.2)
Total Protein: 7.1 g/dL (ref 6.5–8.1)

## 2020-03-16 LAB — LIPID PANEL
Cholesterol: 145 mg/dL (ref 0–200)
HDL: 59 mg/dL (ref >40)
LDL Cholesterol: 72 mg/dL (ref 0–99)
Total CHOL/HDL Ratio: 2.5 RATIO
Triglycerides: 71 mg/dL (ref <150)
VLDL: 14 mg/dL (ref 0–40)

## 2020-03-16 NOTE — Progress Notes (Signed)
Paramedicine Encounter    Patient ID: Michelle Andrade, female    DOB: 08-13-1961, 59 y.o.   MRN: [id number]   Patient Care Team: Henderson Baltimore, [name] as PCP - General (Nurse Practitioner) Haroldine Laws Shaune Pascal, MD as PCP - Cardiology (Cardiology) Jorge Ny, LCSW as Social Worker (Licensed Clinical Social Worker)  Patient Active Problem List   Diagnosis Date Noted  . Abnormal stress test   . Chest pain 01/05/2020  . Secondary hyperparathyroidism of renal origin ([place]) 03/20/2018  . Severe obesity (BMI 35.0-39.9) with comorbidity ([place]) 12/19/2017  . CKD (chronic kidney disease) stage 4, GFR 15-29 ml/min (HCC) 06/07/2017  . Acute on chronic systolic (congestive) heart failure ([place])   . CHF (congestive heart failure) ([place]) 02/09/2017  . Pulmonary vascular congestion 02/09/2017  . Hypertension   . Sleep apnea   . Chondrocalcinosis of both knees 02/08/2017  . Primary osteoarthritis of both knees 02/08/2017  . Type 2 diabetes mellitus ([place]) 11/16/2016  . Borderline personality disorder ([place]) 10/17/2016  . Carpal tunnel syndrome 10/17/2016  . 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 Eczema 07/03/2016  . Atrial tachycardia ([place]) 05/31/2016  . 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 Onychomycosis of toenail 06/08/2015  . Cardiomyopathy ([place]) 06/07/2015  . Irritable bowel syndrome 09/27/2014  . PAD (peripheral artery disease) ([place]) 01/12/2014  . History of DVT (deep vein thrombosis) 01/12/2014  . Acute gout of right knee 12/29/2013  . Iron deficiency anemia 12/29/2013  . Lichenification and lichen simplex chronicus 12/26/2013  . Hyperlipidemia associated with type 2 diabetes mellitus ([place]) 11/10/2013  . Benign hypertension with chronic kidney disease, stage IV ([place]) 11/10/2013  . Disorder of magnesium metabolism 10/13/2013  . CAD in native artery 09/01/2013  . Anxiety state 07/27/2013  .  Depressive disorder 07/27/2013  . Gastro-esophageal reflux disease without esophagitis 07/27/2013  . Low back pain 06/03/2013  . Mitral valve disease 06/03/2013  . Pulmonary valve disorder 06/03/2013  . Tricuspid valve disorder 06/03/2013  . 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 Allergic rhinitis 02/07/2013  . 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 Pain in joint 02/07/2013    Current Outpatient Medications:  .  allopurinol (ZYLOPRIM) 100 MG tablet, Take 200 mg by mouth daily. , Disp: , Rfl:  .  amiodarone (PACERONE) 200 MG tablet, Take 0.5 tablets (100 mg total) by mouth daily., Disp: 90 tablet, Rfl: 3 .  aspirin 81 MG chewable tablet, Chew 81 mg by mouth daily., Disp: , Rfl:  .  atorvastatin (LIPITOR) 80 MG tablet, Take 1 tablet (80 mg total) by mouth at bedtime., Disp: 90 tablet, Rfl: 3 .  Calcium Carbonate-Vitamin D 600-400 MG-UNIT tablet, Take 1 tablet by mouth in the morning. , Disp: , Rfl:  .  Calcium Carbonate-Vitamin D3 (CALCIUM 600/VITAMIN D) 600-400 MG-UNIT TABS, Take 1 tablet by mouth 2 (two) times daily. , Disp: , Rfl:  .  carvedilol (COREG) 25 MG tablet, Take 1 tablet (25 mg total) by mouth 2 (two) times daily with a meal., Disp: 180 tablet, Rfl: 3 .  colchicine 0.6 MG tablet, Take 0.6-1.2 mg by mouth See admin instructions.  (Patient not taking: Reported on 03/02/2020), Disp: , Rfl:  .  cyclobenzaprine (FLEXERIL) 5 MG tablet, Take 5 mg by mouth 3 (three) times daily as needed for muscle spasms., Disp: , Rfl:  .  diclofenac sodium (VOLTAREN) 1 % GEL, Apply 2 g topically 4 (four) times daily., Disp: 100 g, Rfl: 0 .  ferrous sulfate 325 (65 FE) MG tablet, Take 325 mg by mouth 3 (three) times daily. , Disp: , Rfl:  .  hydrALAZINE (APRESOLINE) 50 MG tablet, Take 1.5 tablets (75 mg total) by mouth 3 (three) times daily., Disp: 135 tablet, Rfl: 0 .  Investigational - Study Medication, Take 1 tablet by mouth 2 (two) times daily. Study name: Galactic Heart Failure Study Additional  study details: Omecamtiv Mecarbil or Placebo (Patient not taking: Reported on 02/03/2020), Disp: 1 each, Rfl: PRN .  isosorbide mononitrate (IMDUR) 60 MG 24 hr tablet, Take 1 tablet (60 mg total) by mouth daily., Disp: 30 tablet, Rfl: 0 .  loratadine (CLARITIN) 10 MG tablet, Take 10 mg by mouth daily., Disp: , Rfl:  .  Magnesium Oxide 420 MG TABS, Take 840 mg by mouth daily., Disp: , Rfl:  .  Multiple Vitamin (MULTIVITAMIN) tablet, Take 1 tablet by mouth daily., Disp: , Rfl:  .  omeprazole (PRILOSEC) 40 MG capsule, Take 40 mg by mouth daily., Disp: , Rfl:  .  potassium chloride (KLOR-CON) 10 MEQ tablet, Take 4 tablets (40 mEq total) by mouth daily., Disp: 240 tablet, Rfl: 11 .  rOPINIRole (REQUIP) 0.5 MG tablet, Take 0.5 mg by mouth at bedtime. , Disp: , Rfl:  .  sertraline (ZOLOFT) 100 MG tablet, Take 150 mg by mouth at bedtime. , Disp: , Rfl:  .  torsemide (DEMADEX) 20 MG tablet, Take 1 tablet (20 mg total) by mouth 2 (two) times daily., Disp: 120 tablet, Rfl: 3 .  TRULICITY 2.29 NL/8.9QJ SOPN, Inject 0.75 mg into the skin every Sunday. , Disp: , Rfl:  Allergies  Allergen Reactions  . Codeine Itching  . Gabapentin Itching  . Haldol [Haloperidol Lactate] Other (See Comments)    Dizziness, hallucinations  . Levofloxacin     Other reaction(s): Other (See Comments) Projectile vomiting   . Lisinopril Itching and Swelling  . Losartan Itching  . Morphine And Related Nausea And Vomiting  . Penicillins Nausea And Vomiting    "Vomiting, upset stomach, Headache" Has patient had a PCN reaction causing immediate rash, facial/tongue/throat swelling, SOB or lightheadedness with hypotension: No Has patient had a PCN reaction causing severe rash involving mucus membranes or skin necrosis: No Has patient had a PCN reaction that required hospitalization: No Has patient had a PCN reaction occurring within the last 10 years: No If all of the above answers are "NO", then may proceed with Cephalosporin use.        Social History   Socioeconomic History  . Marital status: Single    Spouse name: Not on file  . Number of children: Not on file  . Years of education: Not on file  . Highest education level: Not on file  Occupational History  . Not on file  Tobacco Use  . Smoking status: Never Smoker  . Smokeless tobacco: Never Used  Vaping Use  . Vaping Use: Never used  Substance and Sexual Activity  . Alcohol use: No  . Drug use: No  . Sexual activity: Yes  Other Topics Concern  . Not on file  Social History Narrative  . Not on file   Social Determinants of Health   Financial Resource Strain:   . Difficulty of Paying Living Expenses:   Food Insecurity:   . Worried About Charity fundraiser in the Last Year:   . Arboriculturist in the Last Year:   Transportation Needs:   . Film/video editor (Medical):   [name]  Lack of Transportation (Non-Medical):   Physical Activity:   . Days of Exercise per Week:   . Minutes of Exercise per Session:   Stress:   . Feeling of Stress :   Social Connections:   . Frequency of Communication with Friends and Family:   . Frequency of Social Gatherings with Friends and Family:   . Attends Religious Services:   . Active Member of Clubs or Organizations:   . Attends Archivist Meetings:   [name] Marital Status:   Intimate Partner Violence:   . Fear of Current or Ex-Partner:   . Emotionally Abused:   [name] Physically Abused:   . Sexually Abused:     Physical Exam Cardiovascular:     Rate and Rhythm: Normal rate and regular rhythm.     Pulses: Normal pulses.  Pulmonary:     Effort: Pulmonary effort is normal.     Breath sounds: Normal breath sounds.  Musculoskeletal:        General: Normal range of motion.     Right lower leg: No edema.     Left lower leg: No edema.  Skin:    General: Skin is warm and dry.     Capillary Refill: Capillary refill takes less than 2 seconds.  Neurological:     Mental Status: She is oriented to person,  place, and time.  Psychiatric:        Mood and Affect: Mood normal.         Future Appointments  Date Time Provider [place]  04/14/2020  9:30 AM [facility]  05/16/2020  9:30 AM [facility]ICE [facility]OTES [facility]  06/16/2020  9:30 AM [facility]  07/18/2020  9:30 AM [facility]  08/18/2020  9:30 AM [facility]  09/19/2020  9:30 AM [facility]  10/20/2020  9:30 AM [facility]  11/21/2020  9:30 AM [facility]  12/22/2020  9:30 AM [facility]    BP 124/68 (BP Location: Left Arm, Patient Position: Sitting, Cuff Size: Normal)   Resp 16   Wt 172 lb (78 kg)   SpO2 97%   BMI 30.47 kg/m   Weight yesterday- 171 lb Last visit weight- 171.6 lb  Ms Tschida was seen in the heart failure clinic following her lab appointment today. She reported feeling well, denying chest pain, SOB, headache, dizziness, orthopnea, fever or cough since our last visit. She stated she has been compliant with her medications over the past two weeks and her weight has been stable. Her medications were verified and  Her pillboxes were refilled. I will follow up in two weeks.   [name], EMT 03/16/20  ACTION: Home visit completed Next visit planned for 2 weeks

## 2020-03-23 ENCOUNTER — Telehealth (HOSPITAL_COMMUNITY): Payer: Self-pay

## 2020-03-23 NOTE — Telephone Encounter (Signed)
I called Michelle Andrade to relay information from the clinic. She had contacted me earlier in the week with concerns about continuing amiodarone due to her liver tests. A doctor at the [facility] had asked her to see if the clinic wanted her to continue the amiodarone since her liver enzymes were still elevated (though trendning down) after decreasing amiodarone to 100 mg daily. I relayed this concern to the Kevan Rosebush, RN, at the HF clinic and she explained that the medication in question was protecting her from arrhythmias and her liver enzymes were significantly improved so she should continue amiodarone. I relayed this information to Michelle Mcquaig and she was understanding and agreeable. Nothing further was necessary at this time.   [name], EMT 03/23/20

## 2020-03-29 ENCOUNTER — Encounter (HOSPITAL_COMMUNITY): Payer: Self-pay | Admitting: Cardiology

## 2020-03-30 ENCOUNTER — Other Ambulatory Visit (HOSPITAL_COMMUNITY): Payer: Self-pay

## 2020-03-30 NOTE — Progress Notes (Signed)
Paramedicine Encounter    Patient ID: Michelle Andrade, female    DOB: 20-Dec-1960, 59 y.o.   MRN: [id number]   Patient Care Team: Henderson Baltimore, [name] as PCP - General (Nurse Practitioner) Haroldine Laws Shaune Pascal, MD as PCP - Cardiology (Cardiology) Jorge Ny, LCSW as Social Worker (Licensed Clinical Social Worker)  Patient Active Problem List   Diagnosis Date Noted  . Abnormal stress test   . Chest pain 01/05/2020  . Secondary hyperparathyroidism of renal origin ([place]) 03/20/2018  . Severe obesity (BMI 35.0-39.9) with comorbidity ([place]) 12/19/2017  . CKD (chronic kidney disease) stage 4, GFR 15-29 ml/min (HCC) 06/07/2017  . Acute on chronic systolic (congestive) heart failure ([place])   . CHF (congestive heart failure) ([place]) 02/09/2017  . Pulmonary vascular congestion 02/09/2017  . Hypertension   . Sleep apnea   . Chondrocalcinosis of both knees 02/08/2017  . Primary osteoarthritis of both knees 02/08/2017  . Type 2 diabetes mellitus ([place]) 11/16/2016  . Borderline personality disorder ([place]) 10/17/2016  . Carpal tunnel syndrome 10/17/2016  . 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 Eczema 07/03/2016  . Atrial tachycardia ([place]) 05/31/2016  . 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 Onychomycosis of toenail 06/08/2015  . Cardiomyopathy ([place]) 06/07/2015  . Irritable bowel syndrome 09/27/2014  . PAD (peripheral artery disease) ([place]) 01/12/2014  . History of DVT (deep vein thrombosis) 01/12/2014  . Acute gout of right knee 12/29/2013  . Iron deficiency anemia 12/29/2013  . Lichenification and lichen simplex chronicus 12/26/2013  . Hyperlipidemia associated with type 2 diabetes mellitus ([place]) 11/10/2013  . Benign hypertension with chronic kidney disease, stage IV ([place]) 11/10/2013  . Disorder of magnesium metabolism 10/13/2013  . CAD in native artery 09/01/2013  . Anxiety state 07/27/2013  .  Depressive disorder 07/27/2013  . Gastro-esophageal reflux disease without esophagitis 07/27/2013  . Low back pain 06/03/2013  . Mitral valve disease 06/03/2013  . Pulmonary valve disorder 06/03/2013  . Tricuspid valve disorder 06/03/2013  . 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 Allergic rhinitis 02/07/2013  . 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 Pain in joint 02/07/2013    Current Outpatient Medications:  .  allopurinol (ZYLOPRIM) 100 MG tablet, Take 200 mg by mouth daily. , Disp: , Rfl:  .  amiodarone (PACERONE) 200 MG tablet, Take 0.5 tablets (100 mg total) by mouth daily., Disp: 90 tablet, Rfl: 3 .  aspirin 81 MG chewable tablet, Chew 81 mg by mouth daily., Disp: , Rfl:  .  atorvastatin (LIPITOR) 80 MG tablet, Take 1 tablet (80 mg total) by mouth at bedtime., Disp: 90 tablet, Rfl: 3 .  Calcium Carbonate-Vitamin D 600-400 MG-UNIT tablet, Take 1 tablet by mouth in the morning. , Disp: , Rfl:  .  Calcium Carbonate-Vitamin D3 (CALCIUM 600/VITAMIN D) 600-400 MG-UNIT TABS, Take 1 tablet by mouth 2 (two) times daily. , Disp: , Rfl:  .  carvedilol (COREG) 25 MG tablet, Take 1 tablet (25 mg total) by mouth 2 (two) times daily with a meal., Disp: 180 tablet, Rfl: 3 .  colchicine 0.6 MG tablet, Take 0.6-1.2 mg by mouth See admin instructions.  (Patient not taking: Reported on 03/02/2020), Disp: , Rfl:  .  cyclobenzaprine (FLEXERIL) 5 MG tablet, Take 5 mg by mouth 3 (three) times daily as needed for muscle spasms. (Patient not taking: Reported on 03/16/2020), Disp: , Rfl:  .  diclofenac sodium (VOLTAREN) 1 % GEL, Apply 2 g topically 4 (four) times daily., Disp:  100 g, Rfl: 0 .  ferrous sulfate 325 (65 FE) MG tablet, Take 325 mg by mouth 3 (three) times daily. , Disp: , Rfl:  .  hydrALAZINE (APRESOLINE) 50 MG tablet, Take 1.5 tablets (75 mg total) by mouth 3 (three) times daily., Disp: 135 tablet, Rfl: 0 .  Investigational - Study Medication, Take 1 tablet by mouth 2 (two) times daily. Study name:  Galactic Heart Failure Study Additional study details: Omecamtiv Mecarbil or Placebo (Patient not taking: Reported on 03/16/2020), Disp: 1 each, Rfl: PRN .  isosorbide mononitrate (IMDUR) 60 MG 24 hr tablet, Take 1 tablet (60 mg total) by mouth daily., Disp: 30 tablet, Rfl: 0 .  loratadine (CLARITIN) 10 MG tablet, Take 10 mg by mouth daily., Disp: , Rfl:  .  Magnesium Oxide 420 MG TABS, Take 840 mg by mouth daily., Disp: , Rfl:  .  Multiple Vitamin (MULTIVITAMIN) tablet, Take 1 tablet by mouth daily., Disp: , Rfl:  .  omeprazole (PRILOSEC) 40 MG capsule, Take 40 mg by mouth daily., Disp: , Rfl:  .  potassium chloride (KLOR-CON) 10 MEQ tablet, Take 4 tablets (40 mEq total) by mouth daily., Disp: 240 tablet, Rfl: 11 .  rOPINIRole (REQUIP) 0.5 MG tablet, Take 0.5 mg by mouth at bedtime. , Disp: , Rfl:  .  sertraline (ZOLOFT) 100 MG tablet, Take 150 mg by mouth at bedtime. , Disp: , Rfl:  .  torsemide (DEMADEX) 20 MG tablet, Take 1 tablet (20 mg total) by mouth 2 (two) times daily., Disp: 120 tablet, Rfl: 3 .  TRULICITY 6.01 UX/3.2TF SOPN, Inject 0.75 mg into the skin every Sunday. , Disp: , Rfl:  Allergies  Allergen Reactions  . Codeine Itching  . Gabapentin Itching  . Haldol [Haloperidol Lactate] Other (See Comments)    Dizziness, hallucinations  . Levofloxacin     Other reaction(s): Other (See Comments) Projectile vomiting   . Lisinopril Itching and Swelling  . Losartan Itching  . Morphine And Related Nausea And Vomiting  . Penicillins Nausea And Vomiting    "Vomiting, upset stomach, Headache" Has patient had a PCN reaction causing immediate rash, facial/tongue/throat swelling, SOB or lightheadedness with hypotension: No Has patient had a PCN reaction causing severe rash involving mucus membranes or skin necrosis: No Has patient had a PCN reaction that required hospitalization: No Has patient had a PCN reaction occurring within the last 10 years: No If all of the above answers are "NO",  then may proceed with Cephalosporin use.       Social History   Socioeconomic History  . Marital status: Single    Spouse name: Not on file  . Number of children: Not on file  . Years of education: Not on file  . Highest education level: Not on file  Occupational History  . Not on file  Tobacco Use  . Smoking status: Never Smoker  . Smokeless tobacco: Never Used  Vaping Use  . Vaping Use: Never used  Substance and Sexual Activity  . Alcohol use: No  . Drug use: No  . Sexual activity: Yes  Other Topics Concern  . Not on file  Social History Narrative  . Not on file   Social Determinants of Health   Financial Resource Strain:   . Difficulty of Paying Living Expenses:   Food Insecurity:   . Worried About Charity fundraiser in the Last Year:   . Arboriculturist in the Last Year:   Transportation Needs:   .  Lack of Transportation (Medical):   [name] Lack of Transportation (Non-Medical):   Physical Activity:   . Days of Exercise per Week:   . Minutes of Exercise per Session:   Stress:   . Feeling of Stress :   Social Connections:   . Frequency of Communication with Friends and Family:   . Frequency of Social Gatherings with Friends and Family:   . Attends Religious Services:   . Active Member of Clubs or Organizations:   . Attends Archivist Meetings:   [name] Marital Status:   Intimate Partner Violence:   . Fear of Current or Ex-Partner:   . Emotionally Abused:   [name] Physically Abused:   . Sexually Abused:     Physical Exam Cardiovascular:     Rate and Rhythm: Normal rate and regular rhythm.     Pulses: Normal pulses.  Pulmonary:     Effort: Pulmonary effort is normal.     Breath sounds: Normal breath sounds.  Musculoskeletal:        General: Normal range of motion.     Right lower leg: No edema.     Left lower leg: No edema.  Skin:    General: Skin is warm and dry.     Capillary Refill: Capillary refill takes less than 2 seconds.  Neurological:      Mental Status: She is alert and oriented to person, place, and time.  Psychiatric:        Mood and Affect: Mood normal.         Future Appointments  Date Time Provider [place]  04/14/2020  9:30 AM [facility] DEVICE REMOTES [facility]  05/16/2020  9:30 AM [facility]ICE [facility]OT[facility]  06/16/2020  9:30 AM [facility]  07/18/2020  9:30 AM [facility]  08/18/2020  9:30 AM [facility]  09/19/2020  9:30 AM [facility]  10/20/2020  9:30 AM [facility]ICE REMOT[facility]  11/21/2020  9:30 AM [facility]ICE REMOTES [facility]  12/22/2020  9:30 AM [facility]ICE [facility]    BP 110/67 (BP Location: Left Arm, Patient Position: Sitting, Cuff Size: Normal)   Pulse 68   Resp 16   Wt 172 lb 6.4 oz (78.2 kg)   SpO2 98%   BMI 30.54 kg/m   Weight yesterday- 171.8 lb Last visit weight- 172 lb  Michelle Andrade was seen at home today and reported feeling well. She denied chest pain, SOB, headache, dizziness, orthopnea, fever or cough over the past two weeks. She stated she has been compliant with her medications over the past week and her weight has been stable. Her medications were verified and her pillbox was refilled. I will follow up in two weeks.   [name], EMT 03/30/20  ACTION: Home visit completed Next visit planned for 2 weeks

## 2020-04-13 ENCOUNTER — Other Ambulatory Visit (HOSPITAL_COMMUNITY): Payer: Self-pay

## 2020-04-13 NOTE — Progress Notes (Signed)
Paramedicine Encounter    Patient ID: Michelle Andrade, female    DOB: 06/01/1961, 59 y.o.   MRN: [id number]   Patient Care Team: Henderson Baltimore, [name] as PCP - General (Nurse Practitioner) Haroldine Laws Shaune Pascal, MD as PCP - Cardiology (Cardiology) Jorge Ny, LCSW as Social Worker (Licensed Clinical Social Worker)  Patient Active Problem List   Diagnosis Date Noted   Abnormal stress test    Chest pain 01/05/2020   Secondary hyperparathyroidism of renal origin ([place]) 03/20/2018   Severe obesity (BMI 35.0-39.9) with comorbidity ([place]) 12/19/2017   CKD (chronic kidney disease) stage 4, GFR 15-29 ml/min (HCC) 06/07/2017   Acute on chronic systolic (congestive) heart failure (HCC)    CHF (congestive heart failure) ([place]) 02/09/2017   Pulmonary vascular congestion 02/09/2017   Hypertension    Sleep apnea    Chondrocalcinosis of both knees 02/08/2017   Primary osteoarthritis of both knees 02/08/2017   Type 2 diabetes mellitus ([place]) 11/16/2016   Borderline personality disorder ([place]) 10/17/2016   Carpal tunnel syndrome 10/17/2016   Shoulder joint replaced by other means 10/17/2016   Eczema 07/03/2016   Atrial tachycardia ([place]) 05/31/2016   Dissociative disorder 03/07/2016   Biventricular ICD (implantable cardioverter-defibrillator) in place 01/25/2016   Onychomycosis of toenail 06/08/2015   Cardiomyopathy ([place]) 06/07/2015   Irritable bowel syndrome 09/27/2014   PAD (peripheral artery disease) ([place]) 01/12/2014   History of DVT (deep vein thrombosis) 01/12/2014   Acute gout of right knee 12/29/2013   Iron deficiency anemia 40/04/6760   Lichenification and lichen simplex chronicus 12/26/2013   Hyperlipidemia associated with type 2 diabetes mellitus ([place]) 11/10/2013   Benign hypertension with chronic kidney disease, stage IV ([place]) 11/10/2013   Disorder of magnesium metabolism 10/13/2013   CAD in native artery 09/01/2013   Anxiety state 07/27/2013    Depressive disorder 07/27/2013   Gastro-esophageal reflux disease without esophagitis 07/27/2013   Low back pain 06/03/2013   Mitral valve disease 06/03/2013   Pulmonary valve disorder 06/03/2013   Tricuspid valve disorder 06/03/2013   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 , Disp: , Rfl:    amiodarone (PACERONE) 200 MG tablet, Take 0.5 tablets (100 mg total) by mouth daily., Disp: 90 tablet, Rfl: 3   aspirin 81 MG chewable tablet, Chew 81 mg by mouth daily., Disp: , Rfl:    atorvastatin (LIPITOR) 80 MG tablet, Take 1 tablet (80 mg total) by mouth at bedtime., Disp: 90 tablet, Rfl: 3   Calcium Carbonate-Vitamin D 600-400 MG-UNIT tablet, Take 1 tablet by mouth in the morning. , Disp: , Rfl:    Calcium Carbonate-Vitamin D3 (CALCIUM 600/VITAMIN D) 600-400 MG-UNIT TABS, Take 1 tablet by mouth 2 (two) times daily. , Disp: , Rfl:    carvedilol (COREG) 25 MG tablet, Take 1 tablet (25 mg total) by mouth 2 (two) times daily with a meal., Disp: 180 tablet, Rfl: 3   colchicine 0.6 MG tablet, Take 0.6-1.2 mg by mouth See admin instructions.  (Patient not taking: Reported on 03/02/2020), Disp: , Rfl:    cyclobenzaprine (FLEXERIL) 5 MG tablet, Take 5 mg by mouth 3 (three) times daily as needed for muscle spasms. (Patient not taking: Reported on 03/16/2020), Disp: , Rfl:    diclofenac sodium (VOLTAREN) 1 % GEL, Apply 2 g topically 4 (four) times daily., Disp:  100 g, Rfl: 0   ferrous sulfate 325 (65 FE) MG tablet, Take 325 mg by mouth 3 (three) times daily. , Disp: , Rfl:    hydrALAZINE (APRESOLINE) 50 MG tablet, Take 1.5 tablets (75 mg total) by mouth 3 (three) times daily., Disp: 135 tablet, Rfl: 0   Investigational - Study Medication, Take 1 tablet by mouth 2 (two) times daily. Study name:  Galactic Heart Failure Study Additional study details: Omecamtiv Mecarbil or Placebo (Patient not taking: Reported on 03/16/2020), Disp: 1 each, Rfl: PRN   isosorbide mononitrate (IMDUR) 60 MG 24 hr tablet, Take 1 tablet (60 mg total) by mouth daily., Disp: 30 tablet, Rfl: 0   loratadine (CLARITIN) 10 MG tablet, Take 10 mg by mouth daily., Disp: , Rfl:    Magnesium Oxide 420 MG TABS, Take 840 mg by mouth daily., Disp: , Rfl:    Multiple Vitamin (MULTIVITAMIN) tablet, Take 1 tablet by mouth daily., Disp: , Rfl:    omeprazole (PRILOSEC) 40 MG capsule, Take 40 mg by mouth daily., Disp: , Rfl:    potassium chloride (KLOR-CON) 10 MEQ tablet, Take 4 tablets (40 mEq total) by mouth daily., Disp: 240 tablet, Rfl: 11   rOPINIRole (REQUIP) 0.5 MG tablet, Take 0.5 mg by mouth at bedtime. , Disp: , Rfl:    sertraline (ZOLOFT) 100 MG tablet, Take 150 mg by mouth at bedtime. , Disp: , Rfl:    torsemide (DEMADEX) 20 MG tablet, Take 1 tablet (20 mg total) by mouth 2 (two) times daily., Disp: 120 tablet, Rfl: 3   TRULICITY 6.60 YT/0.1SW SOPN, Inject 0.75 mg into the skin every Sunday. , Disp: , Rfl:  Allergies  Allergen Reactions   Codeine Itching   Gabapentin Itching   Haldol [Haloperidol Lactate] Other (See Comments)    Dizziness, hallucinations   Levofloxacin     Other reaction(s): Other (See Comments) Projectile vomiting    Lisinopril Itching and Swelling   Losartan Itching   Morphine And Related Nausea And Vomiting   Penicillins Nausea And Vomiting    "Vomiting, upset stomach, Headache" Has patient had a PCN reaction causing immediate rash, facial/tongue/throat swelling, SOB or lightheadedness with hypotension: No Has patient had a PCN reaction causing severe rash involving mucus membranes or skin necrosis: No Has patient had a PCN reaction that required hospitalization: No Has patient had a PCN reaction occurring within the last 10 years: No If all of the above answers are "NO",  then may proceed with Cephalosporin use.       Social History   Socioeconomic History   Marital status: Single    Spouse name: Not on file   Number of children: Not on file   Years of education: Not on file   Highest education level: Not on file  Occupational History   Not on file  Tobacco Use   Smoking status: Never Smoker   Smokeless tobacco: Never Used  Vaping Use   Vaping Use: Never used  Substance and Sexual Activity   Alcohol use: No   Drug use: No   Sexual activity: Yes  Other Topics Concern   Not on file  Social History Narrative   Not on file   Social Determinants of Health   Financial Resource Strain:    Difficulty of Paying Living Expenses:   Food Insecurity:    Worried About [place] in the Last Year:    Ran Out of Food in the Last Year:   Transportation Needs:  Lack of Transportation (Medical):    Lack of Transportation (Non-Medical):   Physical Activity:    Days of Exercise per Week:    Minutes of Exercise per Session:   Stress:    Feeling of Stress :   Social Connections:    Frequency of Communication with Friends and Family:    Frequency of Social Gatherings with Friends and Family:    Attends Religious Services:    Active Member of Clubs or Organizations:    Attends Archivist Meetings:    Marital Status:   Intimate Partner Violence:    Fear of Current or Ex-Partner:    Emotionally Abused:    Physically Abused:    Sexually Abused:     Physical Exam Cardiovascular:     Rate and Rhythm: Normal rate and regular rhythm.     Pulses: Normal pulses.  Pulmonary:     Effort: Pulmonary effort is normal.     Breath sounds: Normal breath sounds.  Musculoskeletal:        General: Normal range of motion.     Right lower leg: No edema.     Left lower leg: No edema.  Skin:    General: Skin is warm and dry.     Capillary Refill: Capillary refill takes less than 2 seconds.  Neurological:      Mental Status: She is alert and oriented to person, place, and time.  Psychiatric:        Mood and Affect: Mood normal.         Future Appointments  Date Time Provider [place]  04/14/2020  9:30 AM [facility]  05/16/2020  9:30 AM [facility]  06/16/2020  9:30 AM [facility]  07/18/2020  9:30 AM [facility]  08/18/2020  9:30 AM [facility]  09/19/2020  9:30 AM [facility]  10/20/2020  9:30 AM [facility]  11/21/2020  9:30 AM [facility]  12/22/2020  9:30 AM [facility]    BP 115/68 (BP Location: Left Arm, Patient Position: Sitting, Cuff Size: Normal)    Pulse 74    Resp 16    Wt 172 lb 12.8 oz (78.4 kg)    SpO2 98%    BMI 30.61 kg/m   Weight yesterday- 173 lb Last visit weight- 172 lb  Michelle Andrade was seen at home today and reported feeling well. She denied chest pain, SOB, headache, dizziness, orthopnea, fever or cough over the past two weeks. She stated she has been compliant with her medications and her weight has remained stable. Her medications were verified and her pillbox was refilled. I will follow up in two weeks.   Michelle Andrade, EMT 04/13/20  ACTION: Home visit completed Next visit planned for 2 weeks

## 2020-04-14 ENCOUNTER — Ambulatory Visit (INDEPENDENT_AMBULATORY_CARE_PROVIDER_SITE_OTHER): Payer: Medicare Other | Admitting: *Deleted

## 2020-04-14 DIAGNOSIS — I429 Cardiomyopathy, unspecified: Secondary | ICD-10-CM

## 2020-04-14 LAB — CUP PACEART REMOTE DEVICE CHECK
Battery Remaining Longevity: 5 mo
Battery Remaining Percentage: 6 %
Battery Voltage: 2.63 V
Brady Statistic AP VP Percent: 12 %
Brady Statistic AP VS Percent: 1 %
Brady Statistic AS VP Percent: 85 %
Brady Statistic AS VS Percent: 3 %
Brady Statistic RA Percent Paced: 11 %
Date Time Interrogation Session: 20210812040015
HighPow Impedance: 66 Ohm
HighPow Impedance: 66 Ohm
Implantable Pulse Generator Implant Date: 20150915
Lead Channel Impedance Value: 300 Ohm
Lead Channel Impedance Value: 400 Ohm
Lead Channel Impedance Value: 410 Ohm
Lead Channel Pacing Threshold Amplitude: 0.5 V
Lead Channel Pacing Threshold Amplitude: 1.5 V
Lead Channel Pacing Threshold Amplitude: 2 V
Lead Channel Pacing Threshold Pulse Width: 0.2 ms
Lead Channel Pacing Threshold Pulse Width: 0.5 ms
Lead Channel Pacing Threshold Pulse Width: 0.5 ms
Lead Channel Sensing Intrinsic Amplitude: 11.7 mV
Lead Channel Sensing Intrinsic Amplitude: 2.4 mV
Lead Channel Setting Pacing Amplitude: 2 V
Lead Channel Setting Pacing Amplitude: 2 V
Lead Channel Setting Pacing Amplitude: 2.5 V
Lead Channel Setting Pacing Pulse Width: 0.5 ms
Lead Channel Setting Pacing Pulse Width: 0.6 ms
Lead Channel Setting Sensing Sensitivity: 0.5 mV
Pulse Gen Serial Number: 7186553

## 2020-04-18 NOTE — Progress Notes (Signed)
Remote ICD transmission.   

## 2020-04-27 ENCOUNTER — Other Ambulatory Visit (HOSPITAL_COMMUNITY): Payer: Self-pay

## 2020-04-27 NOTE — Progress Notes (Signed)
Paramedicine Encounter    Patient ID: Michelle Andrade, female    DOB: [birth year], 59 y.o.   MRN: [id number]   Patient Care Team: Henderson [place], [name] as PCP - General (Nurse Practitioner) Haroldine Laws Shaune Pascal, MD as PCP - Cardiology (Cardiology) Jorge Ny, LCSW as Social Worker (Licensed Clinical Social Worker)  Patient Active Problem List   Diagnosis Date Noted  . Abnormal stress test   . Chest pain 01/05/2020  . Secondary hyperparathyroidism of renal origin ([place]) 03/20/2018  . Severe obesity (BMI 35.0-39.9) with comorbidity ([place]) 12/19/2017  . CKD (chronic kidney disease) stage 4, GFR 15-29 ml/min (HCC) 06/07/2017  . Acute on chronic systolic (congestive) heart failure ([place])   . CHF (congestive heart failure) ([place]) 02/09/2017  . Pulmonary vascular congestion 02/09/2017  . Hypertension   . Sleep apnea   . Chondrocalcinosis of both knees 02/08/2017  . Primary osteoarthritis of both knees 02/08/2017  . Type 2 diabetes mellitus ([place]) 11/16/2016  . Borderline personality disorder ([place]) 10/17/2016  . Carpal tunnel syndrome 10/17/2016  . 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 Eczema 07/03/2016  . Atrial tachycardia ([place]) 05/31/2016  . 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 Onychomycosis of toenail 06/08/2015  . Cardiomyopathy ([place]) 06/07/2015  . Irritable bowel syndrome 09/27/2014  . PAD (peripheral artery disease) ([place]) 01/12/2014  . History of DVT (deep vein thrombosis) 01/12/2014  . Acute gout of right knee 12/29/2013  . Iron deficiency anemia 12/29/2013  . Lichenification and lichen simplex chronicus 12/26/2013  . Hyperlipidemia associated with type 2 diabetes mellitus ([place]) 11/10/2013  . Benign hypertension with chronic kidney disease, stage IV ([place]) 11/10/2013  . Disorder of magnesium metabolism 10/13/2013  . CAD in native artery 09/01/2013  . Anxiety state 07/27/2013  .  Depressive disorder 07/27/2013  . Gastro-esophageal reflux disease without esophagitis 07/27/2013  . Low back pain 06/03/2013  . Mitral valve disease 06/03/2013  . Pulmonary valve disorder 06/03/2013  . Tricuspid valve disorder 06/03/2013  . 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 Allergic rhinitis 02/07/2013  . 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 Pain in joint 02/07/2013    Current Outpatient Medications:  .  allopurinol (ZYLOPRIM) 100 MG tablet, Take 200 mg by mouth daily. , Disp: , Rfl:  .  amiodarone (PACERONE) 200 MG tablet, Take 0.5 tablets (100 mg total) by mouth daily., Disp: 90 tablet, Rfl: 3 .  aspirin 81 MG chewable tablet, Chew 81 mg by mouth daily., Disp: , Rfl:  .  atorvastatin (LIPITOR) 80 MG tablet, Take 1 tablet (80 mg total) by mouth at bedtime., Disp: 90 tablet, Rfl: 3 .  Calcium Carbonate-Vitamin D 600-400 MG-UNIT tablet, Take 1 tablet by mouth in the morning. , Disp: , Rfl:  .  Calcium Carbonate-Vitamin D3 (CALCIUM 600/VITAMIN D) 600-400 MG-UNIT TABS, Take 1 tablet by mouth 2 (two) times daily. , Disp: , Rfl:  .  carvedilol (COREG) 25 MG tablet, Take 1 tablet (25 mg total) by mouth 2 (two) times daily with a meal., Disp: 180 tablet, Rfl: 3 .  colchicine 0.6 MG tablet, Take 0.6-1.2 mg by mouth See admin instructions.  (Patient not taking: Reported on 03/02/2020), Disp: , Rfl:  .  cyclobenzaprine (FLEXERIL) 5 MG tablet, Take 5 mg by mouth 3 (three) times daily as needed for muscle spasms. (Patient not taking: Reported on 03/16/2020), Disp: , Rfl:  .  diclofenac sodium (VOLTAREN) 1 % GEL, Apply 2 g topically 4 (four) times daily., Disp:  100 g, Rfl: 0 .  ferrous sulfate 325 (65 FE) MG tablet, Take 325 mg by mouth 3 (three) times daily. , Disp: , Rfl:  .  hydrALAZINE (APRESOLINE) 50 MG tablet, Take 1.5 tablets (75 mg total) by mouth 3 (three) times daily., Disp: 135 tablet, Rfl: 0 .  Investigational - Study Medication, Take 1 tablet by mouth 2 (two) times daily. Study name:  Galactic Heart Failure Study Additional study details: Omecamtiv Mecarbil or Placebo (Patient not taking: Reported on 03/16/2020), Disp: 1 each, Rfl: PRN .  isosorbide mononitrate (IMDUR) 60 MG 24 hr tablet, Take 1 tablet (60 mg total) by mouth daily., Disp: 30 tablet, Rfl: 0 .  loratadine (CLARITIN) 10 MG tablet, Take 10 mg by mouth daily., Disp: , Rfl:  .  Magnesium Oxide 420 MG TABS, Take 840 mg by mouth daily., Disp: , Rfl:  .  Multiple Vitamin (MULTIVITAMIN) tablet, Take 1 tablet by mouth daily., Disp: , Rfl:  .  omeprazole (PRILOSEC) 40 MG capsule, Take 40 mg by mouth daily., Disp: , Rfl:  .  potassium chloride (KLOR-CON) 10 MEQ tablet, Take 4 tablets (40 mEq total) by mouth daily., Disp: 240 tablet, Rfl: 11 .  rOPINIRole (REQUIP) 0.5 MG tablet, Take 0.5 mg by mouth at bedtime. , Disp: , Rfl:  .  sertraline (ZOLOFT) 100 MG tablet, Take 150 mg by mouth at bedtime. , Disp: , Rfl:  .  torsemide (DEMADEX) 20 MG tablet, Take 1 tablet (20 mg total) by mouth 2 (two) times daily., Disp: 120 tablet, Rfl: 3 .  TRULICITY 1.74 YC/1.4GY SOPN, Inject 0.75 mg into the skin every Sunday. , Disp: , Rfl:  Allergies  Allergen Reactions  . Codeine Itching  . Gabapentin Itching  . Haldol [Haloperidol Lactate] Other (See Comments)    Dizziness, hallucinations  . Levofloxacin     Other reaction(s): Other (See Comments) Projectile vomiting   . Lisinopril Itching and Swelling  . Losartan Itching  . Morphine And Related Nausea And Vomiting  . Penicillins Nausea And Vomiting    "Vomiting, upset stomach, Headache" Has patient had a PCN reaction causing immediate rash, facial/tongue/throat swelling, SOB or lightheadedness with hypotension: No Has patient had a PCN reaction causing severe rash involving mucus membranes or skin necrosis: No Has patient had a PCN reaction that required hospitalization: No Has patient had a PCN reaction occurring within the last 10 years: No If all of the above answers are "NO",  then may proceed with Cephalosporin use.       Social History   Socioeconomic History  . Marital status: Single    Spouse name: Not on file  . Number of children: Not on file  . Years of education: Not on file  . Highest education level: Not on file  Occupational History  . Not on file  Tobacco Use  . Smoking status: Never Smoker  . Smokeless tobacco: Never Used  Vaping Use  . Vaping Use: Never used  Substance and Sexual Activity  . Alcohol use: No  . Drug use: No  . Sexual activity: Yes  Other Topics Concern  . Not on file  Social History Narrative  . Not on file   Social Determinants of Health   Financial Resource Strain:   . Difficulty of Paying Living Expenses: Not on file  Food Insecurity:   . Worried About [place] fund[place] in the Last Year: Not on file  . Ran Out of Food in the Last Year: Not on file  Transportation Needs:   . Film/video editor (Medical): Not on file  . Lack of Transportation (Non-Medical): Not on file  Physical Activity:   . Days of Exercise per Week: Not on file  . Minutes of Exercise per Session: Not on file  Stress:   . Feeling of Stress : Not on file  Social Connections:   . Frequency of Communication with Friends and Family: Not on file  . Frequency of Social Gatherings with Friends and Family: Not on file  . Attends Religious Services: Not on file  . Active Member of Clubs or Organizations: Not on file  . Attends Archivist Meetings: Not on file  . Marital Status: Not on file  Intimate Partner Violence:   . Fear of Current or Ex-Partner: Not on file  . Emotionally Abused: Not on file  . Physically Abused: Not on file  . Sexually Abused: Not on file    Physical Exam Cardiovascular:     Rate and Rhythm: Normal rate.     Pulses: Normal pulses.  Pulmonary:     Effort: Pulmonary effort is normal.     Breath sounds: Normal breath sounds.  Musculoskeletal:        General: Normal range of motion.     Right  lower leg: No edema.     Left lower leg: No edema.  Skin:    General: Skin is warm and dry.     Capillary Refill: Capillary refill takes less than 2 seconds.  Neurological:     Mental Status: She is alert and oriented to person, place, and time.  Psychiatric:        Mood and Affect: Mood normal.         Future Appointments  Date Time Provider [place]  05/16/2020  9:30 AM [facility]  06/16/2020  9:30 AM [facility]  06/27/2020  4:15 PM Camnitz, Ocie Doyne, MD [facility] None  07/18/2020  9:30 AM [facility]  08/18/2020  9:30 AM [facility]  09/19/2020  9:30 AM [facility]  10/20/2020  9:30 AM [facility]  11/21/2020  9:30 AM [facility]  12/22/2020  9:30 AM [facility]    BP 123/71 (BP Location: Left Arm, Patient Position: Sitting, Cuff Size: Normal)   Pulse 80   Resp 18   Wt 171 lb 9.6 oz (77.8 kg)   SpO2 96%   BMI 30.40 kg/m   Weight yesterday- 170 lb Last visit weight- 172.8 lb  Michelle Andrade was seen at home today and reported feeling well. She denied chest pain, SOB, headache, dizziness, orthopnea, fever or cough over the past two weeks. She stated she has been compliant with her medications over the past week and her weight has been stable. Her medications were verified and her pillboxes were refilled. I will follow up in two weeks.   Michelle Andrade, EMT 04/27/20  ACTION: Home visit completed Next visit planned for 2 weeks

## 2020-05-11 ENCOUNTER — Other Ambulatory Visit (HOSPITAL_COMMUNITY): Payer: Self-pay

## 2020-05-11 NOTE — Progress Notes (Signed)
Paramedicine Encounter    Patient ID: Michelle Andrade, female    DOB: [birth year], 59 y.o.   MRN: [id number]   Patient Care Team: Henderson Baltimore, [name] as PCP - General (Nurse Practitioner) Haroldine Laws Shaune Pascal, MD as PCP - Cardiology (Cardiology) Jorge Ny, LCSW as Social Worker (Licensed Clinical Social Worker)  Patient Active Problem List   Diagnosis Date Noted   Abnormal stress test    Chest pain 01/05/2020   Secondary hyperparathyroidism of renal origin ([place]) 03/20/2018   Severe obesity (BMI 35.0-39.9) with comorbidity ([place]) 12/19/2017   CKD (chronic kidney disease) stage 4, GFR 15-29 ml/min (HCC) 06/07/2017   Acute on chronic systolic (congestive) heart failure (HCC)    CHF (congestive heart failure) ([place]) 02/09/2017   Pulmonary vascular congestion 02/09/2017   Hypertension    Sleep apnea    Chondrocalcinosis of both knees 02/08/2017   Primary osteoarthritis of both knees 02/08/2017   Type 2 diabetes mellitus ([place]) 11/16/2016   Borderline personality disorder ([place]) 10/17/2016   Carpal tunnel syndrome 10/17/2016   Shoulder joint replaced by other means 10/17/2016   Eczema 07/03/2016   Atrial tachycardia ([place]) 05/31/2016   Dissociative disorder 03/07/2016   Biventricular ICD (implantable cardioverter-defibrillator) in place 01/25/2016   Onychomycosis of toenail 06/08/2015   Cardiomyopathy ([place]) 06/07/2015   Irritable bowel syndrome 09/27/2014   PAD (peripheral artery disease) ([place]) 01/12/2014   History of DVT (deep vein thrombosis) 01/12/2014   Acute gout of right knee 12/29/2013   Iron deficiency anemia 99/83/3825   Lichenification and lichen simplex chronicus 12/26/2013   Hyperlipidemia associated with type 2 diabetes mellitus ([place]) 11/10/2013   Benign hypertension with chronic kidney disease, stage IV ([place]) 11/10/2013   Disorder of magnesium metabolism 10/13/2013   CAD in native artery 09/01/2013   Anxiety state 07/27/2013    Depressive disorder 07/27/2013   Gastro-esophageal reflux disease without esophagitis 07/27/2013   Low back pain 06/03/2013   Mitral valve disease 06/03/2013   Pulmonary valve disorder 06/03/2013   Tricuspid valve disorder 06/03/2013   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 , Disp: , Rfl:    amiodarone (PACERONE) 200 MG tablet, Take 0.5 tablets (100 mg total) by mouth daily., Disp: 90 tablet, Rfl: 3   aspirin 81 MG chewable tablet, Chew 81 mg by mouth daily., Disp: , Rfl:    atorvastatin (LIPITOR) 80 MG tablet, Take 1 tablet (80 mg total) by mouth at bedtime., Disp: 90 tablet, Rfl: 3   Calcium Carbonate-Vitamin D3 (CALCIUM 600/VITAMIN D) 600-400 MG-UNIT TABS, Take 1 tablet by mouth 2 (two) times daily. , Disp: , Rfl:    carvedilol (COREG) 25 MG tablet, Take 1 tablet (25 mg total) by mouth 2 (two) times daily with a meal., Disp: 180 tablet, Rfl: 3   ferrous sulfate 325 (65 FE) MG tablet, Take 325 mg by mouth 3 (three) times daily. , Disp: , Rfl:    hydrALAZINE (APRESOLINE) 50 MG tablet, Take 1.5 tablets (75 mg total) by mouth 3 (three) times daily., Disp: 135 tablet, Rfl: 0   isosorbide mononitrate (IMDUR) 60 MG 24 hr tablet, Take 1 tablet (60 mg total) by mouth daily., Disp: 30 tablet, Rfl: 0   loratadine (CLARITIN) 10 MG tablet, Take 10 mg by mouth daily., Disp: , Rfl:    Magnesium Oxide 420 MG  TABS, Take 840 mg by mouth daily., Disp: , Rfl:    Multiple Vitamin (MULTIVITAMIN) tablet, Take 1 tablet by mouth daily., Disp: , Rfl:    omeprazole (PRILOSEC) 40 MG capsule, Take 40 mg by mouth daily., Disp: , Rfl:    potassium chloride (KLOR-CON) 10 MEQ tablet, Take 4 tablets (40 mEq total) by mouth daily., Disp: 240 tablet, Rfl: 11   rOPINIRole (REQUIP) 0.5 MG tablet, Take 0.5  mg by mouth at bedtime. , Disp: , Rfl:    sertraline (ZOLOFT) 100 MG tablet, Take 150 mg by mouth at bedtime. , Disp: , Rfl:    torsemide (DEMADEX) 20 MG tablet, Take 1 tablet (20 mg total) by mouth 2 (two) times daily., Disp: 120 tablet, Rfl: 3   TRULICITY 9.15 AV/6.9VX SOPN, Inject 0.75 mg into the skin every Sunday. , Disp: , Rfl:    Calcium Carbonate-Vitamin D 600-400 MG-UNIT tablet, Take 1 tablet by mouth in the morning. , Disp: , Rfl:    colchicine 0.6 MG tablet, Take 0.6-1.2 mg by mouth See admin instructions.  (Patient not taking: Reported on 03/02/2020), Disp: , Rfl:    cyclobenzaprine (FLEXERIL) 5 MG tablet, Take 5 mg by mouth 3 (three) times daily as needed for muscle spasms. (Patient not taking: Reported on 03/16/2020), Disp: , Rfl:    diclofenac sodium (VOLTAREN) 1 % GEL, Apply 2 g topically 4 (four) times daily. (Patient not taking: Reported on 05/11/2020), Disp: 100 g, Rfl: 0   Investigational - Study Medication, Take 1 tablet by mouth 2 (two) times daily. Study name: Galactic Heart Failure Study Additional study details: Omecamtiv Mecarbil or Placebo (Patient not taking: Reported on 03/16/2020), Disp: 1 each, Rfl: PRN Allergies  Allergen Reactions   Codeine Itching   Gabapentin Itching   Haldol [Haloperidol Lactate] Other (See Comments)    Dizziness, hallucinations   Levofloxacin     Other reaction(s): Other (See Comments) Projectile vomiting    Lisinopril Itching and Swelling   Losartan Itching   Morphine And Related Nausea And Vomiting   Penicillins Nausea And Vomiting    "Vomiting, upset stomach, Headache" Has patient had a PCN reaction causing immediate rash, facial/tongue/throat swelling, SOB or lightheadedness with hypotension: No Has patient had a PCN reaction causing severe rash involving mucus membranes or skin necrosis: No Has patient had a PCN reaction that required hospitalization: No Has patient had a PCN reaction occurring within the last 10 years:  No If all of the above answers are "NO", then may proceed with Cephalosporin use.       Social History   Socioeconomic History   Marital status: Single    Spouse name: Not on file   Number of children: Not on file   Years of education: Not on file   Highest education level: Not on file  Occupational History   Not on file  Tobacco Use   Smoking status: Never Smoker   Smokeless tobacco: Never Used  Vaping Use   Vaping Use: Never used  Substance and Sexual Activity   Alcohol use: No   Drug use: No   Sexual activity: Yes  Other Topics Concern   Not on file  Social History Narrative   Not on file   Social Determinants of Health   Financial Resource Strain:    Difficulty of Paying Living Expenses: Not on file  Food Insecurity:    Worried About [place] in the Last Year: Not on file   Ran Out of Food in  the Last Year: Not on file  Transportation Needs:    Lack of Transportation (Medical): Not on file   Lack of Transportation (Non-Medical): Not on file  Physical Activity:    Days of Exercise per Week: Not on file   Minutes of Exercise per Session: Not on file  Stress:    Feeling of Stress : Not on file  Social Connections:    Frequency of Communication with Friends and Family: Not on file   Frequency of Social Gatherings with Friends and Family: Not on file   Attends Religious Services: Not on file   Active Member of [place] or Organizations: Not on file   Attends Archivist Meetings: Not on file   Marital Status: Not on file  Intimate Partner Violence:    Fear of Current or Ex-Partner: Not on file   Emotionally Abused: Not on file   Physically Abused: Not on file   Sexually Abused: Not on file    Physical Exam      Future Appointments  Date Time Provider [place]  05/16/2020  9:30 AM [facility]  06/16/2020  9:30 AM [facility]ICE [facility]OTES [facility]  06/27/2020  4:15 PM Camnitz, Ocie Doyne, MD [facility] None  07/18/2020  9:30 AM [facility]OTES [facility]  08/18/2020  9:30 AM [facility]  09/19/2020  9:30 AM [facility]  10/20/2020  9:30 AM [facility]  11/21/2020  9:30 AM [facility]  12/22/2020  9:30 AM [facility]    BP (!) 122/55 (BP Location: Left Arm, Patient Position: Sitting, Cuff Size: Normal)    Pulse 80    Resp 18    Wt 172 lb 12.8 oz (78.4 kg)    SpO2 96%    BMI 30.61 kg/m   Weight yesterday- did not weigh Last visit weight- 171.6 lb  Mr Janicki was seen at home today and reported feeling well. She denied chest pain, SOB, headache, dizziness, orthopnea, fever or cough over the past week. She stated she has been compliant with her medications over the past two weeks and her weight has been stable. Her medications were verified and her pillbox was refilled. I will follow up next week.   [name], EMT 05/11/20  ACTION: Home visit completed Next visit planned for 2 weeks

## 2020-05-13 ENCOUNTER — Encounter (HOSPITAL_COMMUNITY): Payer: Self-pay | Admitting: *Deleted

## 2020-05-13 NOTE — Progress Notes (Signed)
Pt's records faxed to Dr Mosetta Pigeon at [facility] at [phone] per Dr Aundra Dubin for a 2nd opinion for VAD

## 2020-05-16 ENCOUNTER — Ambulatory Visit (INDEPENDENT_AMBULATORY_CARE_PROVIDER_SITE_OTHER): Payer: Medicare Other | Admitting: *Deleted

## 2020-05-16 DIAGNOSIS — I429 Cardiomyopathy, unspecified: Secondary | ICD-10-CM | POA: Diagnosis not present

## 2020-05-16 DIAGNOSIS — I5022 Chronic systolic (congestive) heart failure: Secondary | ICD-10-CM

## 2020-05-16 LAB — CUP PACEART REMOTE DEVICE CHECK
Battery Remaining Longevity: 4 mo
Battery Remaining Percentage: 4 %
Battery Voltage: 2.62 V
Brady Statistic AP VP Percent: 12 %
Brady Statistic AP VS Percent: 1 %
Brady Statistic AS VP Percent: 86 %
Brady Statistic AS VS Percent: 2.5 %
Brady Statistic RA Percent Paced: 11 %
Date Time Interrogation Session: 20210913030637
HighPow Impedance: 62 Ohm
HighPow Impedance: 62 Ohm
Implantable Pulse Generator Implant Date: 20150915
Lead Channel Impedance Value: 300 Ohm
Lead Channel Impedance Value: 380 Ohm
Lead Channel Impedance Value: 400 Ohm
Lead Channel Pacing Threshold Amplitude: 0.5 V
Lead Channel Pacing Threshold Amplitude: 1.5 V
Lead Channel Pacing Threshold Amplitude: 2 V
Lead Channel Pacing Threshold Pulse Width: 0.2 ms
Lead Channel Pacing Threshold Pulse Width: 0.5 ms
Lead Channel Pacing Threshold Pulse Width: 0.5 ms
Lead Channel Sensing Intrinsic Amplitude: 11.7 mV
Lead Channel Sensing Intrinsic Amplitude: 2.2 mV
Lead Channel Setting Pacing Amplitude: 2 V
Lead Channel Setting Pacing Amplitude: 2 V
Lead Channel Setting Pacing Amplitude: 2.5 V
Lead Channel Setting Pacing Pulse Width: 0.5 ms
Lead Channel Setting Pacing Pulse Width: 0.6 ms
Lead Channel Setting Sensing Sensitivity: 0.5 mV
Pulse Gen Serial Number: 7186553

## 2020-05-18 NOTE — Progress Notes (Signed)
Remote ICD transmission.   

## 2020-05-25 ENCOUNTER — Other Ambulatory Visit (HOSPITAL_COMMUNITY): Payer: Self-pay

## 2020-05-25 NOTE — Progress Notes (Signed)
Paramedicine Encounter    Patient ID: Michelle Andrade, female    DOB: 11-19-1960, 59 y.o.   MRN: [id number]   Patient Care Team: Henderson Baltimore, [name] as PCP - General (Nurse Practitioner) Haroldine Laws Shaune Pascal, MD as PCP - Cardiology (Cardiology) Jorge Ny, LCSW as Social Worker (Licensed Clinical Social Worker)  Patient Active Problem List   Diagnosis Date Noted  . Abnormal stress test   . Chest pain 01/05/2020  . Secondary hyperparathyroidism of renal origin ([place]) 03/20/2018  . Severe obesity (BMI 35.0-39.9) with comorbidity ([place]) 12/19/2017  . CKD (chronic kidney disease) stage 4, GFR 15-29 ml/min (HCC) 06/07/2017  . Acute on chronic systolic (congestive) heart failure ([place])   . CHF (congestive heart failure) ([place]) 02/09/2017  . Pulmonary vascular congestion 02/09/2017  . Hypertension   . Sleep apnea   . Chondrocalcinosis of both knees 02/08/2017  . Primary osteoarthritis of both knees 02/08/2017  . Type 2 diabetes mellitus ([place]) 11/16/2016  . Borderline personality disorder ([place]) 10/17/2016  . Carpal tunnel syndrome 10/17/2016  . 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 Eczema 07/03/2016  . Atrial tachycardia ([place]) 05/31/2016  . 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 Onychomycosis of toenail 06/08/2015  . Cardiomyopathy ([place]) 06/07/2015  . Irritable bowel syndrome 09/27/2014  . PAD (peripheral artery disease) ([place]) 01/12/2014  . History of DVT (deep vein thrombosis) 01/12/2014  . Acute gout of right knee 12/29/2013  . Iron deficiency anemia 12/29/2013  . Lichenification and lichen simplex chronicus 12/26/2013  . Hyperlipidemia associated with type 2 diabetes mellitus ([place]) 11/10/2013  . Benign hypertension with chronic kidney disease, stage IV ([place]) 11/10/2013  . Disorder of magnesium metabolism 10/13/2013  . CAD in native artery 09/01/2013  . Anxiety state 07/27/2013  .  Depressive disorder 07/27/2013  . Gastro-esophageal reflux disease without esophagitis 07/27/2013  . Low back pain 06/03/2013  . Mitral valve disease 06/03/2013  . Pulmonary valve disorder 06/03/2013  . Tricuspid valve disorder 06/03/2013  . 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 Allergic rhinitis 02/07/2013  . 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 Pain in joint 02/07/2013    Current Outpatient Medications:  .  allopurinol (ZYLOPRIM) 100 MG tablet, Take 200 mg by mouth daily. , Disp: , Rfl:  .  amiodarone (PACERONE) 200 MG tablet, Take 0.5 tablets (100 mg total) by mouth daily., Disp: 90 tablet, Rfl: 3 .  aspirin 81 MG chewable tablet, Chew 81 mg by mouth daily., Disp: , Rfl:  .  atorvastatin (LIPITOR) 80 MG tablet, Take 1 tablet (80 mg total) by mouth at bedtime., Disp: 90 tablet, Rfl: 3 .  Calcium Carbonate-Vitamin D3 (CALCIUM 600/VITAMIN D) 600-400 MG-UNIT TABS, Take 1 tablet by mouth 2 (two) times daily. , Disp: , Rfl:  .  carvedilol (COREG) 25 MG tablet, Take 1 tablet (25 mg total) by mouth 2 (two) times daily with a meal., Disp: 180 tablet, Rfl: 3 .  ferrous sulfate 325 (65 FE) MG tablet, Take 325 mg by mouth 3 (three) times daily. , Disp: , Rfl:  .  hydrALAZINE (APRESOLINE) 50 MG tablet, Take 1.5 tablets (75 mg total) by mouth 3 (three) times daily., Disp: 135 tablet, Rfl: 0 .  isosorbide mononitrate (IMDUR) 60 MG 24 hr tablet, Take 1 tablet (60 mg total) by mouth daily., Disp: 30 tablet, Rfl: 0 .  loratadine (CLARITIN) 10 MG tablet, Take 10 mg by mouth daily., Disp: , Rfl:  .  Magnesium Oxide 420 MG  TABS, Take 840 mg by mouth daily., Disp: , Rfl:  .  Multiple Vitamin (MULTIVITAMIN) tablet, Take 1 tablet by mouth daily., Disp: , Rfl:  .  omeprazole (PRILOSEC) 40 MG capsule, Take 40 mg by mouth daily., Disp: , Rfl:  .  potassium chloride (KLOR-CON) 10 MEQ tablet, Take 4 tablets (40 mEq total) by mouth daily., Disp: 240 tablet, Rfl: 11 .  rOPINIRole (REQUIP) 0.5 MG tablet, Take 0.5  mg by mouth at bedtime. , Disp: , Rfl:  .  sertraline (ZOLOFT) 100 MG tablet, Take 150 mg by mouth at bedtime. , Disp: , Rfl:  .  TRULICITY 4.12 IN/8.6VE SOPN, Inject 0.75 mg into the skin every Sunday. , Disp: , Rfl:  .  Calcium Carbonate-Vitamin D 600-400 MG-UNIT tablet, Take 1 tablet by mouth in the morning.  (Patient not taking: Reported on 05/25/2020), Disp: , Rfl:  .  colchicine 0.6 MG tablet, Take 0.6-1.2 mg by mouth See admin instructions.  (Patient not taking: Reported on 03/02/2020), Disp: , Rfl:  .  cyclobenzaprine (FLEXERIL) 5 MG tablet, Take 5 mg by mouth 3 (three) times daily as needed for muscle spasms. (Patient not taking: Reported on 03/16/2020), Disp: , Rfl:  .  diclofenac sodium (VOLTAREN) 1 % GEL, Apply 2 g topically 4 (four) times daily. (Patient not taking: Reported on 05/11/2020), Disp: 100 g, Rfl: 0 .  Investigational - Study Medication, Take 1 tablet by mouth 2 (two) times daily. Study name: Galactic Heart Failure Study Additional study details: Omecamtiv Mecarbil or Placebo (Patient not taking: Reported on 03/16/2020), Disp: 1 each, Rfl: PRN .  torsemide (DEMADEX) 20 MG tablet, Take 1 tablet (20 mg total) by mouth 2 (two) times daily., Disp: 120 tablet, Rfl: 3 Allergies  Allergen Reactions  . Codeine Itching  . Gabapentin Itching  . Haldol [Haloperidol Lactate] Other (See Comments)    Dizziness, hallucinations  . Levofloxacin     Other reaction(s): Other (See Comments) Projectile vomiting   . Lisinopril Itching and Swelling  . Losartan Itching  . Morphine And Related Nausea And Vomiting  . Penicillins Nausea And Vomiting    "Vomiting, upset stomach, Headache" Has patient had a PCN reaction causing immediate rash, facial/tongue/throat swelling, SOB or lightheadedness with hypotension: No Has patient had a PCN reaction causing severe rash involving mucus membranes or skin necrosis: No Has patient had a PCN reaction that required hospitalization: No Has patient had a PCN  reaction occurring within the last 10 years: No If all of the above answers are "NO", then may proceed with Cephalosporin use.       Social History   Socioeconomic History  . Marital status: Single    Spouse name: Not on file  . Number of children: Not on file  . Years of education: Not on file  . Highest education level: Not on file  Occupational History  . Not on file  Tobacco Use  . Smoking status: Never Smoker  . Smokeless tobacco: Never Used  Vaping Use  . Vaping Use: Never used  Substance and Sexual Activity  . Alcohol use: No  . Drug use: No  . Sexual activity: Yes  Other Topics Concern  . Not on file  Social History Narrative  . Not on file   Social Determinants of Health   Financial Resource Strain:   . Difficulty of Paying Living Expenses: Not on file  Food Insecurity:   . Worried About [place] in the Last Year: Not on file  .  Ran Out of Food in the Last Year: Not on file  Transportation Needs:   . Lack of Transportation (Medical): Not on file  . Lack of Transportation (Non-Medical): Not on file  Physical Activity:   . Days of Exercise per Week: Not on file  . Minutes of Exercise per Session: Not on file  Stress:   . Feeling of Stress : Not on file  Social Connections:   . Frequency of Communication with Friends and Family: Not on file  . Frequency of Social Gatherings with Friends and Family: Not on file  . Attends Religious Services: Not on file  . Active Member of Clubs or Organizations: Not on file  . Attends Archivist Meetings: Not on file  . Marital Status: Not on file  Intimate Partner Violence:   . Fear of Current or Ex-Partner: Not on file  . Emotionally Abused: Not on file  . Physically Abused: Not on file  . Sexually Abused: Not on file    Physical Exam Cardiovascular:     Rate and Rhythm: Normal rate and regular rhythm.     Pulses: Normal pulses.  Pulmonary:     Effort: Pulmonary effort is normal.      Breath sounds: Normal breath sounds.  Musculoskeletal:        General: Normal range of motion.     Right lower leg: No edema.     Left lower leg: No edema.  Skin:    General: Skin is warm and dry.     Capillary Refill: Capillary refill takes less than 2 seconds.  Neurological:     Mental Status: She is alert and oriented to person, place, and time.  Psychiatric:        Mood and Affect: Mood normal.         Future Appointments  Date Time Provider [place]  06/16/2020  9:30 AM [facility] DEVICE REMOTES [facility]  06/27/2020  4:15 PM Camnitz, Ocie Doyne, MD [facility] None  07/18/2020  9:30 AM [facility] DEVICE REMOTES [facility]  08/18/2020  9:30 AM [facility]ICE REMOTES [facility]  09/19/2020  9:30 AM [facility]ICE REMOTES [facility]  10/20/2020  9:30 AM [facility] DEVICE REMOTES [facility]  11/21/2020  9:30 AM [facility] DEVICE REMOTES [facility]  12/22/2020  9:30 AM [facility] DEVICE REMOTES [facility]    BP 114/72 (BP Location: Left Arm, Patient Position: Sitting, Cuff Size: Normal)   Pulse 72   Resp 18   Wt 173 lb 12.8 oz (78.8 kg)   SpO2 96%   BMI 30.79 kg/m   Weight yesterday- did not weigh Last visit weight- 172 lb  Ms Lucken was seen at home today and reported feeling well. She denied chest pain, SOB, headache, dizziness, orthopnea, fever or cough. She stated she has been compliant with her medications and her weight remains stable. Her medications were verified and her pillbox was refilled. I will follow up in two weeks.   [name], EMT 05/25/20  ACTION: Home visit completed Next visit planned for 2 weeks

## 2020-06-03 ENCOUNTER — Other Ambulatory Visit (HOSPITAL_COMMUNITY): Payer: Self-pay | Admitting: Internal Medicine

## 2020-06-08 ENCOUNTER — Other Ambulatory Visit (HOSPITAL_COMMUNITY): Payer: Self-pay

## 2020-06-08 NOTE — Progress Notes (Signed)
Paramedicine Encounter    Patient ID: Michelle Andrade, female    DOB: [birth year], 59 y.o.   MRN: [id number]   Patient Care Team: Henderson Baltimore, [name] as PCP - General (Nurse Practitioner) Haroldine Laws Shaune Pascal, MD as PCP - Cardiology (Cardiology) Jorge Ny, LCSW as Social Worker (Licensed Clinical Social Worker)  Patient Active Problem List   Diagnosis Date Noted   Abnormal stress test    Chest pain 01/05/2020   Secondary hyperparathyroidism of renal origin ([place]) 03/20/2018   Severe obesity (BMI 35.0-39.9) with comorbidity ([place]) 12/19/2017   CKD (chronic kidney disease) stage 4, GFR 15-29 ml/min (HCC) 06/07/2017   Acute on chronic systolic (congestive) heart failure (HCC)    CHF (congestive heart failure) ([place]) 02/09/2017   Pulmonary vascular congestion 02/09/2017   Hypertension    Sleep apnea    Chondrocalcinosis of both knees 02/08/2017   Primary osteoarthritis of both knees 02/08/2017   Type 2 diabetes mellitus ([place]) 11/16/2016   Borderline personality disorder ([place]) 10/17/2016   Carpal tunnel syndrome 10/17/2016   Shoulder joint replaced by other means 10/17/2016   Eczema 07/03/2016   Atrial tachycardia ([place]) 05/31/2016   Dissociative disorder 03/07/2016   Biventricular ICD (implantable cardioverter-defibrillator) in place 01/25/2016   Onychomycosis of toenail 06/08/2015   Cardiomyopathy ([place]) 06/07/2015   Irritable bowel syndrome 09/27/2014   PAD (peripheral artery disease) ([place]) 01/12/2014   History of DVT (deep vein thrombosis) 01/12/2014   Acute gout of right knee 12/29/2013   Iron deficiency anemia 79/10/4095   Lichenification and lichen simplex chronicus 12/26/2013   Hyperlipidemia associated with type 2 diabetes mellitus ([place]) 11/10/2013   Benign hypertension with chronic kidney disease, stage IV ([place]) 11/10/2013   Disorder of magnesium metabolism 10/13/2013   CAD in native artery 09/01/2013   Anxiety state 07/27/2013    Depressive disorder 07/27/2013   Gastro-esophageal reflux disease without esophagitis 07/27/2013   Low back pain 06/03/2013   Mitral valve disease 06/03/2013   Pulmonary valve disorder 06/03/2013   Tricuspid valve disorder 06/03/2013   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 , Disp: , Rfl:    amiodarone (PACERONE) 200 MG tablet, Take 0.5 tablets (100 mg total) by mouth daily., Disp: 90 tablet, Rfl: 3   aspirin 81 MG chewable tablet, Chew 81 mg by mouth daily., Disp: , Rfl:    atorvastatin (LIPITOR) 80 MG tablet, Take 1 tablet (80 mg total) by mouth at bedtime., Disp: 90 tablet, Rfl: 3   Calcium Carbonate-Vitamin D 600-400 MG-UNIT tablet, Take 1 tablet by mouth in the morning.  (Patient not taking: Reported on 05/25/2020), Disp: , Rfl:    Calcium Carbonate-Vitamin D3 (CALCIUM 600/VITAMIN D) 600-400 MG-UNIT TABS, Take 1 tablet by mouth 2 (two) times daily. , Disp: , Rfl:    carvedilol (COREG) 25 MG tablet, Take 1 tablet (25 mg total) by mouth 2 (two) times daily with a meal., Disp: 180 tablet, Rfl: 3   colchicine 0.6 MG tablet, Take 0.6-1.2 mg by mouth See admin instructions.  (Patient not taking: Reported on 03/02/2020), Disp: , Rfl:    cyclobenzaprine (FLEXERIL) 5 MG tablet, Take 5 mg by mouth 3 (three) times daily as needed for muscle spasms. (Patient not taking: Reported on 03/16/2020), Disp: , Rfl:    diclofenac sodium (VOLTAREN) 1 % GEL, Apply 2 g  topically 4 (four) times daily. (Patient not taking: Reported on 05/11/2020), Disp: 100 g, Rfl: 0   ferrous sulfate 325 (65 FE) MG tablet, Take 325 mg by mouth 3 (three) times daily. , Disp: , Rfl:    hydrALAZINE (APRESOLINE) 50 MG tablet, Take 1.5 tablets (75 mg total) by mouth 3 (three) times daily., Disp: 135 tablet, Rfl: 0    Investigational - Study Medication, Take 1 tablet by mouth 2 (two) times daily. Study name: Galactic Heart Failure Study Additional study details: Omecamtiv Mecarbil or Placebo (Patient not taking: Reported on 03/16/2020), Disp: 1 each, Rfl: PRN   isosorbide mononitrate (IMDUR) 60 MG 24 hr tablet, Take 1 tablet (60 mg total) by mouth daily., Disp: 30 tablet, Rfl: 0   loratadine (CLARITIN) 10 MG tablet, Take 10 mg by mouth daily., Disp: , Rfl:    Magnesium Oxide 420 MG TABS, Take 840 mg by mouth daily., Disp: , Rfl:    Multiple Vitamin (MULTIVITAMIN) tablet, Take 1 tablet by mouth daily., Disp: , Rfl:    omeprazole (PRILOSEC) 40 MG capsule, Take 40 mg by mouth daily., Disp: , Rfl:    potassium chloride (KLOR-CON) 10 MEQ tablet, Take 4 tablets (40 mEq total) by mouth daily., Disp: 240 tablet, Rfl: 11   rOPINIRole (REQUIP) 0.5 MG tablet, Take 0.5 mg by mouth at bedtime. , Disp: , Rfl:    sertraline (ZOLOFT) 100 MG tablet, Take 150 mg by mouth at bedtime. , Disp: , Rfl:    torsemide (DEMADEX) 20 MG tablet, Take 1 tablet (20 mg total) by mouth 2 (two) times daily., Disp: 120 tablet, Rfl: 3   TRULICITY 3.71 IR/6.7EL SOPN, Inject 0.75 mg into the skin every Sunday. , Disp: , Rfl:  Allergies  Allergen Reactions   Codeine Itching   Gabapentin Itching   Haldol [Haloperidol Lactate] Other (See Comments)    Dizziness, hallucinations   Levofloxacin     Other reaction(s): Other (See Comments) Projectile vomiting    Lisinopril Itching and Swelling   Losartan Itching   Morphine And Related Nausea And Vomiting   Penicillins Nausea And Vomiting    "Vomiting, upset stomach, Headache" Has patient had a PCN reaction causing immediate rash, facial/tongue/throat swelling, SOB or lightheadedness with hypotension: No Has patient had a PCN reaction causing severe rash involving mucus membranes or skin necrosis: No Has patient had a PCN reaction that required hospitalization: No Has patient had a  PCN reaction occurring within the last 10 years: No If all of the above answers are "NO", then may proceed with Cephalosporin use.       Social History   Socioeconomic History   Marital status: Single    Spouse name: Not on file   Number of children: Not on file   Years of education: Not on file   Highest education level: Not on file  Occupational History   Not on file  Tobacco Use   Smoking status: Never Smoker   Smokeless tobacco: Never Used  Vaping Use   Vaping Use: Never used  Substance and Sexual Activity   Alcohol use: No   Drug use: No   Sexual activity: Yes  Other Topics Concern   Not on file  Social History Narrative   Not on file   Social Determinants of Health   Financial Resource Strain:    Difficulty of Paying Living Expenses: Not on file  Food Insecurity:    Worried About [place] in the Last Year: Not on file  Ran Out of Food in the Last Year: Not on file  Transportation Needs:    Lack of Transportation (Medical): Not on file   Lack of Transportation (Non-Medical): Not on file  Physical Activity:    Days of Exercise per Week: Not on file   Minutes of Exercise per Session: Not on file  Stress:    Feeling of Stress : Not on file  Social Connections:    Frequency of Communication with Friends and Family: Not on file   Frequency of Social Gatherings with Friends and Family: Not on file   Attends Religious Services: Not on file   Active Member of Clubs or Organizations: Not on file   Attends Archivist Meetings: Not on file   Marital Status: Not on file  Intimate Partner Violence:    Fear of Current or Ex-Partner: Not on file   Emotionally Abused: Not on file   Physically Abused: Not on file   Sexually Abused: Not on file    Physical Exam Cardiovascular:     Rate and Rhythm: Normal rate and regular rhythm.     Pulses: Normal pulses.  Pulmonary:     Effort: Pulmonary effort is normal.      Breath sounds: Normal breath sounds.  Musculoskeletal:        General: Normal range of motion.     Right lower leg: No edema.     Left lower leg: No edema.  Skin:    General: Skin is warm and dry.     Capillary Refill: Capillary refill takes less than 2 seconds.  Neurological:     Mental Status: She is alert and oriented to person, place, and time.  Psychiatric:        Mood and Affect: Mood normal.         Future Appointments  Date Time Provider [place]  06/16/2020  9:30 AM [facility]  06/27/2020  4:15 PM Camnitz, Ocie Doyne, MD [facility] None  07/18/2020  9:30 AM [facility]ICE [facility]OTES [facility]  08/18/2020  9:30 AM [facility]  09/19/2020  9:30 AM [facility]  10/20/2020  9:30 AM [facility]OT[facility]  11/21/2020  9:30 AM [facility]  12/22/2020  9:30 AM [facility]ICE [facility]    BP 123/70 (BP Location: Left Arm, Patient Position: Sitting, Cuff Size: Normal)    Pulse 76    Resp 18    Wt 170 lb 9.6 oz (77.4 kg)    SpO2 98%    BMI 30.22 kg/m   Weight yesterday- did not weigh  Last visit weight- 173 lb  Michelle Andrade was seen at home today and reported feeling well. She denied chest pain, SOB, headache, dizziness, orthopnea, cough or fever. She stated she has been compliant with her medications over the past two weeks and her weight has been stable. Her medications were verified and her pillbox was refilled. I will follow up in two weeks.   Michelle Andrade, EMT 06/08/20  ACTION: Home visit completed Next visit planned for 2 weeks

## 2020-06-16 ENCOUNTER — Ambulatory Visit (INDEPENDENT_AMBULATORY_CARE_PROVIDER_SITE_OTHER): Payer: Medicare Other

## 2020-06-16 DIAGNOSIS — I429 Cardiomyopathy, unspecified: Secondary | ICD-10-CM

## 2020-06-17 LAB — CUP PACEART REMOTE DEVICE CHECK
Battery Remaining Longevity: 4 mo
Battery Remaining Percentage: 4 %
Battery Voltage: 2.62 V
Brady Statistic AP VP Percent: 11 %
Brady Statistic AP VS Percent: 1 %
Brady Statistic AS VP Percent: 87 %
Brady Statistic AS VS Percent: 2.1 %
Brady Statistic RA Percent Paced: 11 %
Date Time Interrogation Session: 20211014040015
HighPow Impedance: 60 Ohm
HighPow Impedance: 60 Ohm
Implantable Pulse Generator Implant Date: 20150915
Lead Channel Impedance Value: 290 Ohm
Lead Channel Impedance Value: 400 Ohm
Lead Channel Impedance Value: 410 Ohm
Lead Channel Pacing Threshold Amplitude: 0.5 V
Lead Channel Pacing Threshold Amplitude: 1.5 V
Lead Channel Pacing Threshold Amplitude: 2 V
Lead Channel Pacing Threshold Pulse Width: 0.2 ms
Lead Channel Pacing Threshold Pulse Width: 0.5 ms
Lead Channel Pacing Threshold Pulse Width: 0.5 ms
Lead Channel Sensing Intrinsic Amplitude: 11.7 mV
Lead Channel Sensing Intrinsic Amplitude: 2.7 mV
Lead Channel Setting Pacing Amplitude: 2 V
Lead Channel Setting Pacing Amplitude: 2 V
Lead Channel Setting Pacing Amplitude: 2.5 V
Lead Channel Setting Pacing Pulse Width: 0.5 ms
Lead Channel Setting Pacing Pulse Width: 0.6 ms
Lead Channel Setting Sensing Sensitivity: 0.5 mV
Pulse Gen Serial Number: 7186553

## 2020-06-21 NOTE — Addendum Note (Signed)
Addended by: Cheri Kearns A on: 06/21/2020 12:12 PM   Modules accepted: Level of Service

## 2020-06-21 NOTE — Progress Notes (Signed)
Remote ICD transmission.   

## 2020-06-22 ENCOUNTER — Other Ambulatory Visit (HOSPITAL_COMMUNITY): Payer: Self-pay

## 2020-06-22 NOTE — Progress Notes (Signed)
Paramedicine Encounter    Patient ID: Michelle Andrade, female    DOB: 09-15-1960, 59 y.o.   MRN: [id number]   Patient Care Team: Henderson [place], [name] as PCP - General (Nurse Practitioner) Haroldine Laws Shaune Pascal, MD as PCP - Cardiology (Cardiology) Jorge Ny, LCSW as Social Worker (Licensed Clinical Social Worker)  Patient Active Problem List   Diagnosis Date Noted  . Abnormal stress test   . Chest pain 01/05/2020  . Secondary hyperparathyroidism of renal origin ([place]) 03/20/2018  . Severe obesity (BMI 35.0-39.9) with comorbidity ([place]) 12/19/2017  . CKD (chronic kidney disease) stage 4, GFR 15-29 ml/min (HCC) 06/07/2017  . Acute on chronic systolic (congestive) heart failure ([place])   . CHF (congestive heart failure) ([place]) 02/09/2017  . Pulmonary vascular congestion 02/09/2017  . Hypertension   . Sleep apnea   . Chondrocalcinosis of both knees 02/08/2017  . Primary osteoarthritis of both knees 02/08/2017  . Type 2 diabetes mellitus ([place]) 11/16/2016  . Borderline personality disorder ([place]) 10/17/2016  . Carpal tunnel syndrome 10/17/2016  . 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 Eczema 07/03/2016  . Atrial tachycardia ([place]) 05/31/2016  . 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 Onychomycosis of toenail 06/08/2015  . Cardiomyopathy ([place]) 06/07/2015  . Irritable bowel syndrome 09/27/2014  . PAD (peripheral artery disease) ([place]) 01/12/2014  . History of DVT (deep vein thrombosis) 01/12/2014  . Acute gout of right knee 12/29/2013  . Iron deficiency anemia 12/29/2013  . Lichenification and lichen simplex chronicus 12/26/2013  . Hyperlipidemia associated with type 2 diabetes mellitus ([place]) 11/10/2013  . Benign hypertension with chronic kidney disease, stage IV ([place]) 11/10/2013  . Disorder of magnesium metabolism 10/13/2013  . CAD in native artery 09/01/2013  . Anxiety state 07/27/2013  .  Depressive disorder 07/27/2013  . Gastro-esophageal reflux disease without esophagitis 07/27/2013  . Low back pain 06/03/2013  . Mitral valve disease 06/03/2013  . Pulmonary valve disorder 06/03/2013  . Tricuspid valve disorder 06/03/2013  . 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 Allergic rhinitis 02/07/2013  . 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 Pain in joint 02/07/2013    Current Outpatient Medications:  .  allopurinol (ZYLOPRIM) 100 MG tablet, Take 200 mg by mouth daily. , Disp: , Rfl:  .  amiodarone (PACERONE) 200 MG tablet, Take 0.5 tablets (100 mg total) by mouth daily., Disp: 90 tablet, Rfl: 3 .  aspirin 81 MG chewable tablet, Chew 81 mg by mouth daily., Disp: , Rfl:  .  atorvastatin (LIPITOR) 80 MG tablet, Take 1 tablet (80 mg total) by mouth at bedtime., Disp: 90 tablet, Rfl: 3 .  Calcium Carbonate-Vitamin D3 (CALCIUM 600/VITAMIN D) 600-400 MG-UNIT TABS, Take 1 tablet by mouth 2 (two) times daily. , Disp: , Rfl:  .  carvedilol (COREG) 25 MG tablet, Take 1 tablet (25 mg total) by mouth 2 (two) times daily with a meal., Disp: 180 tablet, Rfl: 3 .  ferrous sulfate 325 (65 FE) MG tablet, Take 325 mg by mouth 3 (three) times daily. , Disp: , Rfl:  .  hydrALAZINE (APRESOLINE) 50 MG tablet, Take 1.5 tablets (75 mg total) by mouth 3 (three) times daily., Disp: 135 tablet, Rfl: 0 .  isosorbide mononitrate (IMDUR) 60 MG 24 hr tablet, Take 1 tablet (60 mg total) by mouth daily., Disp: 30 tablet, Rfl: 0 .  loratadine (CLARITIN) 10 MG tablet, Take 10 mg by mouth daily., Disp: , Rfl:  .  Magnesium Oxide 420 MG  TABS, Take 840 mg by mouth daily., Disp: , Rfl:  .  Multiple Vitamin (MULTIVITAMIN) tablet, Take 1 tablet by mouth daily., Disp: , Rfl:  .  omeprazole (PRILOSEC) 40 MG capsule, Take 40 mg by mouth daily., Disp: , Rfl:  .  potassium chloride (KLOR-CON) 10 MEQ tablet, Take 4 tablets (40 mEq total) by mouth daily., Disp: 240 tablet, Rfl: 11 .  rOPINIRole (REQUIP) 0.5 MG tablet, Take 0.5  mg by mouth at bedtime. , Disp: , Rfl:  .  sertraline (ZOLOFT) 100 MG tablet, Take 150 mg by mouth at bedtime. , Disp: , Rfl:  .  torsemide (DEMADEX) 20 MG tablet, Take 1 tablet (20 mg total) by mouth 2 (two) times daily., Disp: 120 tablet, Rfl: 3 .  TRULICITY 6.60 YT/0.1SW SOPN, Inject 0.75 mg into the skin every Sunday. , Disp: , Rfl:  .  Calcium Carbonate-Vitamin D 600-400 MG-UNIT tablet, Take 1 tablet by mouth in the morning.  (Patient not taking: Reported on 05/25/2020), Disp: , Rfl:  .  colchicine 0.6 MG tablet, Take 0.6-1.2 mg by mouth See admin instructions.  (Patient not taking: Reported on 03/02/2020), Disp: , Rfl:  .  cyclobenzaprine (FLEXERIL) 5 MG tablet, Take 5 mg by mouth 3 (three) times daily as needed for muscle spasms. (Patient not taking: Reported on 03/16/2020), Disp: , Rfl:  .  diclofenac sodium (VOLTAREN) 1 % GEL, Apply 2 g topically 4 (four) times daily. (Patient not taking: Reported on 05/11/2020), Disp: 100 g, Rfl: 0 .  Investigational - Study Medication, Take 1 tablet by mouth 2 (two) times daily. Study name: Galactic Heart Failure Study Additional study details: Omecamtiv Mecarbil or Placebo (Patient not taking: Reported on 03/16/2020), Disp: 1 each, Rfl: PRN Allergies  Allergen Reactions  . Codeine Itching  . Gabapentin Itching  . Haldol [Haloperidol Lactate] Other (See Comments)    Dizziness, hallucinations  . Levofloxacin     Other reaction(s): Other (See Comments) Projectile vomiting   . Lisinopril Itching and Swelling  . Losartan Itching  . Morphine And Related Nausea And Vomiting  . Penicillins Nausea And Vomiting    "Vomiting, upset stomach, Headache" Has patient had a PCN reaction causing immediate rash, facial/tongue/throat swelling, SOB or lightheadedness with hypotension: No Has patient had a PCN reaction causing severe rash involving mucus membranes or skin necrosis: No Has patient had a PCN reaction that required hospitalization: No Has patient had a PCN  reaction occurring within the last 10 years: No If all of the above answers are "NO", then may proceed with Cephalosporin use.       Social History   Socioeconomic History  . Marital status: Single    Spouse name: Not on file  . Number of children: Not on file  . Years of education: Not on file  . Highest education level: Not on file  Occupational History  . Not on file  Tobacco Use  . Smoking status: Never Smoker  . Smokeless tobacco: Never Used  Vaping Use  . Vaping Use: Never used  Substance and Sexual Activity  . Alcohol use: No  . Drug use: No  . Sexual activity: Yes  Other Topics Concern  . Not on file  Social History Narrative  . Not on file   Social Determinants of Health   Financial Resource Strain:   . Difficulty of Paying Living Expenses: Not on file  Food Insecurity:   . Worried About Charity fundraiser in the Last Year: Not on file  .  Ran Out of Food in the Last Year: Not on file  Transportation Needs:   . Lack of Transportation (Medical): Not on file  . Lack of Transportation (Non-Medical): Not on file  Physical Activity:   . Days of Exercise per Week: Not on file  . Minutes of Exercise per Session: Not on file  Stress:   . Feeling of Stress : Not on file  Social Connections:   . Frequency of Communication with Friends and Family: Not on file  . Frequency of Social Gatherings with Friends and Family: Not on file  . Attends Religious Services: Not on file  . Active Member of Clubs or Organizations: Not on file  . Attends Archivist Meetings: Not on file  . Marital Status: Not on file  Intimate Partner Violence:   . Fear of Current or Ex-Partner: Not on file  . Emotionally Abused: Not on file  . Physically Abused: Not on file  . Sexually Abused: Not on file    Physical Exam Cardiovascular:     Rate and Rhythm: Normal rate and regular rhythm.     Pulses: Normal pulses.  Pulmonary:     Effort: Pulmonary effort is normal.      Breath sounds: Normal breath sounds.  Abdominal:     General: There is no distension.  Musculoskeletal:        General: Normal range of motion.     Right lower leg: No edema.     Left lower leg: No edema.  Skin:    General: Skin is warm and dry.     Capillary Refill: Capillary refill takes less than 2 seconds.  Neurological:     Mental Status: She is alert and oriented to person, place, and time.  Psychiatric:        Mood and Affect: Mood normal.         Future Appointments  Date Time Provider [place]  06/27/2020  4:15 PM Constance Haw, MD [facility] None  07/18/2020  9:30 AM [facility] DEVICE REMOTES [facility]  08/18/2020  9:30 AM [facility]ICE REMOT[facility]  09/19/2020  9:30 AM [facility]  10/20/2020  9:30 AM [facility]ICE REMOTES [facility]  11/21/2020  9:30 AM [facility]  12/22/2020  9:30 AM [facility] DEVICE REMOTES [facility]    BP 132/74 (BP Location: Left Arm, Patient Position: Sitting, Cuff Size: Normal)   Pulse 78   Resp 18   Wt 175 lb (79.4 kg)   SpO2 96%   BMI 31.00 kg/m   Weight yesterday- 174 lb Last visit weight- 170 lb  Ms Penick was seen at home today and reported feeling well. She denied chest pain, SOB, headache, dizziness, orthopnea, fever or cough over the past two weeks. She reported being compliant with his medications over the past week and her weight has been stable. Her medications were verified and her pillboxes were refilled. I will follow up in two weeks.  [name], EMT 06/22/20  ACTION: Home visit completed Next visit planned for 2 weeks

## 2020-06-27 ENCOUNTER — Ambulatory Visit (INDEPENDENT_AMBULATORY_CARE_PROVIDER_SITE_OTHER): Payer: Medicare Other | Admitting: Cardiology

## 2020-06-27 ENCOUNTER — Encounter: Payer: Self-pay | Admitting: Cardiology

## 2020-06-27 ENCOUNTER — Other Ambulatory Visit: Payer: Self-pay

## 2020-06-27 VITALS — BP 144/72 | HR 71 | Ht 63.0 in | Wt 177.0 lb

## 2020-06-27 DIAGNOSIS — I428 Other cardiomyopathies: Secondary | ICD-10-CM

## 2020-06-27 DIAGNOSIS — Z79899 Other long term (current) drug therapy: Secondary | ICD-10-CM | POA: Diagnosis not present

## 2020-06-27 NOTE — Patient Instructions (Addendum)
Medication Instructions:  Your physician recommends that you continue on your current medications as directed. Please refer to the Current Medication list given to you today.  *If you need a refill on your cardiac medications before your next appointment, please call your pharmacy*   Lab Work: Today: TSH & LFTs If you have labs (blood work) drawn today and your tests are completely normal, you will receive your results only by: [name] MyChart Message (if you have MyChart) OR . A paper copy in the mail If you have any lab test that is abnormal or we need to change your treatment, we will call you to review the results.   Testing/Procedures: None ordered   Follow-Up: At [facility], you and your health needs are our priority.  As part of our continuing mission to provide you with exceptional heart care, we have created designated Provider Care Teams.  These Care Teams include your primary Cardiologist (physician) and Advanced Practice Providers (APPs -  Physician Assistants and Nurse Practitioners) who all work together to provide you with the care you need, when you need it.   Your next appointment:   1 year(s)  The format for your next appointment:   In Person  Provider:   Allegra Lai, MD    Thank you for choosing [facility]!!   Trinidad Curet, RN [phone]   Other Instructions  We will arrange monthly remote checks to check battery life of your device.

## 2020-06-27 NOTE — Progress Notes (Signed)
Electrophysiology Office Note   Date:  06/27/2020   ID:  Michelle Andrade, DOB 1961/06/11, MRN [id number]  PCP:  Zachery Dauer, MD  Cardiologist:  [facility] Primary Electrophysiologist:  Michelle Andrade Meredith Leeds, MD    Chief Complaint: CHF   History of Present Illness: Michelle Andrade is a 59 y.o. female who is being seen today for the evaluation of CHF at the request of Michelle Andrade, Michelle Bruins, [name]. Presenting today for electrophysiology evaluation.  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  She is status post [employer] CRT-D.  Today, denies symptoms of palpitations, chest pain, shortness of breath, orthopnea, PND, lower extremity edema, claudication, dizziness, presyncope, syncope, bleeding, or neurologic sequela. The patient is tolerating medications without difficulties.  She has been doing well.  She has no chest pain or shortness of breath.  She is able do all of her daily activities without restriction.  Her device is functioning appropriately but is nearing ERI.  She is unaware of any problems.  Past Medical History:  Diagnosis Date  . Arthritis 02/08/2017  . 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 CAD (coronary artery disease) 09/01/2013  . CHF (congestive heart failure) ([place]) 06/07/2015  . CKD (chronic kidney disease), stage III (Michelle Andrade) 12/09/2013  . Depression 07/27/2013  . Diabetes mellitus without complication ([place]) 65/46/5035  . GERD (gastroesophageal reflux disease) 07/27/2013  . Gout 12/29/2013  . Heart valve disorder 06/03/2013  . High cholesterol 11/10/2013  . Hypertension 11/10/2013  . IBS (irritable bowel syndrome) 09/27/2014  . Myocardial infarct (Michelle Andrade)   . Sleep apnea 10/13/2013  . Vascular disorder 01/12/2014   Past Surgical History:  Procedure Laterality Date  .  ABDOMINAL HYSTERECTOMY    . CARDIAC DEFIBRILLATOR PLACEMENT    . CARPAL TUNNEL RELEASE    . HEEL SPUR EXCISION    . INTRAVASCULAR PRESSURE WIRE/FFR STUDY N/A 01/08/2020   Procedure: INTRAVASCULAR PRESSURE WIRE/FFR STUDY;  Surgeon: Michelle Booze, MD;  Location: [facility];  Service: Cardiovascular;  Laterality: N/A;  . KNEE SURGERY    . LEFT HEART CATH AND CORONARY ANGIOGRAPHY N/A 01/08/2020   Procedure: LEFT HEART CATH AND CORONARY ANGIOGRAPHY;  Surgeon: Michelle Artist, MD;  Location: [facility];  Service: Cardiovascular;  Laterality: N/A;  . PACEMAKER GENERATOR CHANGE    . RIGHT HEART CATH N/A 02/13/2017   Procedure: Right Heart Cath;  Surgeon: Michelle Artist, MD;  Location: [facility];  Service: Cardiovascular;  Laterality: N/A;  . SHOULDER SURGERY       Current Outpatient Medications  Medication Sig Dispense Refill  . allopurinol (ZYLOPRIM) 100 MG tablet Take 200 mg by mouth daily.     [name] amiodarone (PACERONE) 200 MG tablet Take 0.5 tablets (100 mg total) by mouth daily. 90 tablet 3  . aspirin 81 MG chewable tablet Chew 81 mg by mouth daily.    [name] atorvastatin (LIPITOR) 80 MG tablet Take 1 tablet (80 mg total) by mouth at bedtime. 90 tablet 3  . Calcium Carbonate-Vitamin D 600-400 MG-UNIT tablet Take 1 tablet by mouth in the morning.     . Calcium Carbonate-Vitamin D3 (CALCIUM 600/VITAMIN D) 600-400 MG-UNIT TABS Take 1 tablet by mouth 2 (two) times daily.     . carvedilol (COREG) 25 MG tablet Take 1 tablet (25 mg total) by mouth 2 (two) times daily with a meal.  180 tablet 3  . colchicine 0.6 MG tablet Take 0.6-1.2 mg by mouth See admin instructions.     . cyclobenzaprine (FLEXERIL) 5 MG tablet Take 5 mg by mouth 3 (three) times daily as needed for muscle spasms.     . diclofenac sodium (VOLTAREN) 1 % GEL Apply 2 g topically 4 (four) times daily. 100 g 0  . ferrous sulfate 325 (65 FE) MG tablet Take 325 mg by mouth 3 (three) times daily.     .  hydrALAZINE (APRESOLINE) 50 MG tablet Take 1.5 tablets (75 mg total) by mouth 3 (three) times daily. 135 tablet 0  . isosorbide mononitrate (IMDUR) 60 MG 24 hr tablet Take 1 tablet (60 mg total) by mouth daily. 30 tablet 0  . loratadine (CLARITIN) 10 MG tablet Take 10 mg by mouth daily.    . Magnesium Oxide 420 MG TABS Take 840 mg by mouth daily.    . Multiple Vitamin (MULTIVITAMIN) tablet Take 1 tablet by mouth daily.    [name] omeprazole (PRILOSEC) 40 MG capsule Take 40 mg by mouth daily.    . potassium chloride (KLOR-CON) 10 MEQ tablet Take 4 tablets (40 mEq total) by mouth daily. 240 tablet 11  . rOPINIRole (REQUIP) 0.5 MG tablet Take 0.5 mg by mouth at bedtime.     . sertraline (ZOLOFT) 100 MG tablet Take 150 mg by mouth at bedtime.     . torsemide (DEMADEX) 20 MG tablet Take 1 tablet (20 mg total) by mouth 2 (two) times daily. 120 tablet 3  . TRULICITY 8.09 XI/3.3AS SOPN Inject 0.75 mg into the skin every Sunday.      No current facility-administered medications for this visit.    Allergies:   Codeine, Gabapentin, Haldol [haloperidol lactate], Levofloxacin, Lisinopril, Losartan, Morphine and related, and Penicillins   Social History:  The patient  reports that she has never smoked. She has never used smokeless tobacco. She reports that she does not drink alcohol and does not use drugs.   Family History:  The patient's family history includes Hypertension in her mother.    ROS:  Please see the history of present illness.   Otherwise, review of systems is positive for none.   All other systems are reviewed and negative.   PHYSICAL EXAM: VS:  BP (!) 144/72   Pulse 71   Ht 5\' 3"  (1.6 m)   Wt 177 lb (80.3 kg)   SpO2 97%   BMI 31.35 kg/m  , BMI Body mass index is 31.35 kg/m. GEN: Well nourished, well developed, in no acute distress  HEENT: normal  Neck: no JVD, carotid bruits, or masses Cardiac: RRR; no murmurs, rubs, or gallops,no edema  Respiratory:  clear to auscultation  bilaterally, normal work of breathing GI: soft, nontender, nondistended, + BS MS: no deformity or atrophy  Skin: warm and dry, device site well healed Neuro:  Strength and sensation are intact Psych: euthymic mood, full affect  EKG:  EKG is ordered today. Personal review of the ekg ordered shows sinus rhythm, ventricular paced  Personal review of the device interrogation today. Results in [place]: 01/05/2020: B Natriuretic Peptide 113.7 01/06/2020: TSH 2.230 01/07/2020: Hemoglobin 12.4; Platelets 227 01/12/2020: BUN 16; Creatinine, Ser 1.43; Potassium 4.0; Sodium 140 03/16/2020: ALT 53    Lipid Panel     Component Value Date/Time   CHOL 145 03/16/2020 1008   TRIG 71 03/16/2020 1008   HDL 59 03/16/2020 1008   CHOLHDL 2.5 03/16/2020 1008  VLDL 14 03/16/2020 1008   LDLCALC 72 03/16/2020 1008     Wt Readings from Last 3 Encounters:  06/27/20 177 lb (80.3 kg)  06/22/20 175 lb (79.4 kg)  06/08/20 170 lb 9.6 oz (77.4 kg)      Other studies Reviewed: Additional studies/ records that were reviewed today include: TTE 11/05/18  Review of the above records today demonstrates:  1. The left ventricle has moderate-severely reduced systolic function,  with an ejection fraction of 30-35%. The cavity size was normal. Left  ventricular diastolic Doppler parameters are consistent with impaired  relaxation Elevated left ventricular  end-diastolic pressure Left ventricular diffuse hypokinesis.  2. The right ventricle has normal systolic function. The cavity was  normal. There is no increase in right ventricular wall thickness. Right  ventricular systolic pressure normal with an estimated pressure of 26.7  mmHg.  3. Left atrial size was severely dilated.  4. The mitral valve is normal in structure. Mild thickening of the mitral  valve leaflet. Mild calcification of the anterior mitral valve leaflet.  5. The tricuspid valve is normal in structure.  6. The aortic valve is  tricuspid Mild sclerosis of the aortic valve.  7. The pulmonic valve was normal in structure.    ASSESSMENT AND PLAN:  1.  Chronic systolic heart failure due to nonischemic cardiomyopathy: Currently on optimal medical therapy with Coreg, hydralazine, isosorbide, Aldactone.  Also takes metolazone and torsemide.  Is status post [facility] Jude CRT-D implanted in [place].  She is currently doing well.  No heart failure symptoms.  Her device is nearing ERI we Seiya Silsby check monthly.  2.  Hypertension: Being followed by the ENT program through the heart failure clinic.  No changes.  3.  Atrial tachycardia: Currently on amiodarone (ECG and laboratory monitoring for high risk medication).  Has had minimal tachycardia.  Britnee Mcdevitt check amiodarone labs today.  Current medicines are reviewed at length with the patient today.   The patient does not have concerns regarding her medicines.  The following changes were made today: None  Labs/ tests ordered today include:  Orders Placed This Encounter  Procedures  . TSH  . Hepatic function panel  . EKG 12-Lead     Disposition:   FU with Diania Co 6 months  Signed, Zyriah Mask Meredith Leeds, MD  06/27/2020 4:37 PM     [address] [place] [address] [phone] (office) [phone] (fax)

## 2020-06-28 LAB — HEPATIC FUNCTION PANEL
ALT: 27 IU/L (ref 0–32)
AST: 32 IU/L (ref 0–40)
Albumin: 4 g/dL (ref 3.8–4.8)
Alkaline Phosphatase: 130 IU/L — ABNORMAL HIGH (ref 44–121)
Bilirubin Total: 0.3 mg/dL (ref 0.0–1.2)
Bilirubin, Direct: 0.1 mg/dL (ref 0.00–0.40)
Total Protein: 7 g/dL (ref 6.0–8.5)

## 2020-06-28 LAB — TSH: TSH: 3.09 u[IU]/mL (ref 0.450–4.500)

## 2020-07-06 ENCOUNTER — Other Ambulatory Visit (HOSPITAL_COMMUNITY): Payer: Self-pay

## 2020-07-06 NOTE — Progress Notes (Signed)
Paramedicine Encounter    Patient ID: Michelle Andrade, female    DOB: 1961/08/01, 59 y.o.   MRN: [id number]   Patient Care Team: Zachery Dauer, MD as PCP - General (Internal Medicine) Constance Haw, MD as PCP - Electrophysiology (Cardiology) Bensimhon, Shaune Pascal, MD as PCP - Cardiology (Cardiology) Jorge Ny, LCSW as Social Worker (Licensed Clinical Social Worker)  Patient Active Problem List   Diagnosis Date Noted  . Abnormal stress test   . Chest pain 01/05/2020  . Secondary hyperparathyroidism of renal origin ([place]) 03/20/2018  . Severe obesity (BMI 35.0-39.9) with comorbidity ([place]) 12/19/2017  . CKD (chronic kidney disease) stage 4, GFR 15-29 ml/min (HCC) 06/07/2017  . Acute on chronic systolic (congestive) heart failure ([place])   . CHF (congestive heart failure) ([place]) 02/09/2017  . Pulmonary vascular congestion 02/09/2017  . Hypertension   . Sleep apnea   . Chondrocalcinosis of both knees 02/08/2017  . Primary osteoarthritis of both knees 02/08/2017  . Type 2 diabetes mellitus ([place]) 11/16/2016  . Borderline personality disorder ([place]) 10/17/2016  . Carpal tunnel syndrome 10/17/2016  . 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 Eczema 07/03/2016  . Atrial tachycardia ([place]) 05/31/2016  . 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 Onychomycosis of toenail 06/08/2015  . Cardiomyopathy ([place]) 06/07/2015  . Irritable bowel syndrome 09/27/2014  . PAD (peripheral artery disease) ([place]) 01/12/2014  . History of DVT (deep vein thrombosis) 01/12/2014  . Acute gout of right knee 12/29/2013  . Iron deficiency anemia 12/29/2013  . Lichenification and lichen simplex chronicus 12/26/2013  . Hyperlipidemia associated with type 2 diabetes mellitus ([place]) 11/10/2013  . Benign hypertension with chronic kidney disease, stage IV ([place]) 11/10/2013  . Disorder of magnesium metabolism 10/13/2013  . CAD in  native artery 09/01/2013  . Anxiety state 07/27/2013  . Depressive disorder 07/27/2013  . Gastro-esophageal reflux disease without esophagitis 07/27/2013  . Low back pain 06/03/2013  . Mitral valve disease 06/03/2013  . Pulmonary valve disorder 06/03/2013  . Tricuspid valve disorder 06/03/2013  . 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 Allergic rhinitis 02/07/2013  . 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 Pain in joint 02/07/2013    Current Outpatient Medications:  .  allopurinol (ZYLOPRIM) 100 MG tablet, Take 200 mg by mouth daily. , Disp: , Rfl:  .  amiodarone (PACERONE) 200 MG tablet, Take 0.5 tablets (100 mg total) by mouth daily., Disp: 90 tablet, Rfl: 3 .  aspirin 81 MG chewable tablet, Chew 81 mg by mouth daily., Disp: , Rfl:  .  atorvastatin (LIPITOR) 80 MG tablet, Take 1 tablet (80 mg total) by mouth at bedtime., Disp: 90 tablet, Rfl: 3 .  Calcium Carbonate-Vitamin D 600-400 MG-UNIT tablet, Take 1 tablet by mouth in the morning. , Disp: , Rfl:  .  Calcium Carbonate-Vitamin D3 (CALCIUM 600/VITAMIN D) 600-400 MG-UNIT TABS, Take 1 tablet by mouth 2 (two) times daily. , Disp: , Rfl:  .  carvedilol (COREG) 25 MG tablet, Take 1 tablet (25 mg total) by mouth 2 (two) times daily with a meal., Disp: 180 tablet, Rfl: 3 .  colchicine 0.6 MG tablet, Take 0.6-1.2 mg by mouth See admin instructions. , Disp: , Rfl:  .  cyclobenzaprine (FLEXERIL) 5 MG tablet, Take 5 mg by mouth 3 (three) times daily as needed for muscle spasms. , Disp: , Rfl:  .  diclofenac sodium (VOLTAREN) 1 % GEL, Apply 2 g topically 4 (four) times daily., Disp: 100 g,  Rfl: 0 .  ferrous sulfate 325 (65 FE) MG tablet, Take 325 mg by mouth 3 (three) times daily. , Disp: , Rfl:  .  hydrALAZINE (APRESOLINE) 50 MG tablet, Take 1.5 tablets (75 mg total) by mouth 3 (three) times daily., Disp: 135 tablet, Rfl: 0 .  isosorbide mononitrate (IMDUR) 60 MG 24 hr tablet, Take 1 tablet (60 mg total) by mouth daily., Disp: 30 tablet, Rfl:  0 .  loratadine (CLARITIN) 10 MG tablet, Take 10 mg by mouth daily., Disp: , Rfl:  .  Magnesium Oxide 420 MG TABS, Take 840 mg by mouth daily., Disp: , Rfl:  .  Multiple Vitamin (MULTIVITAMIN) tablet, Take 1 tablet by mouth daily., Disp: , Rfl:  .  omeprazole (PRILOSEC) 40 MG capsule, Take 40 mg by mouth daily., Disp: , Rfl:  .  potassium chloride (KLOR-CON) 10 MEQ tablet, Take 4 tablets (40 mEq total) by mouth daily., Disp: 240 tablet, Rfl: 11 .  rOPINIRole (REQUIP) 0.5 MG tablet, Take 0.5 mg by mouth at bedtime. , Disp: , Rfl:  .  sertraline (ZOLOFT) 100 MG tablet, Take 150 mg by mouth at bedtime. , Disp: , Rfl:  .  torsemide (DEMADEX) 20 MG tablet, Take 1 tablet (20 mg total) by mouth 2 (two) times daily., Disp: 120 tablet, Rfl: 3 .  TRULICITY 3.89 HT/3.4KA SOPN, Inject 0.75 mg into the skin every Sunday. , Disp: , Rfl:  Allergies  Allergen Reactions  . Codeine Itching  . Gabapentin Itching  . Haldol [Haloperidol Lactate] Other (See Comments)    Dizziness, hallucinations  . Levofloxacin     Other reaction(s): Other (See Comments) Projectile vomiting   . Lisinopril Itching and Swelling  . Losartan Itching  . Morphine And Related Nausea And Vomiting  . Penicillins Nausea And Vomiting    "Vomiting, upset stomach, Headache" Has patient had a PCN reaction causing immediate rash, facial/tongue/throat swelling, SOB or lightheadedness with hypotension: No Has patient had a PCN reaction causing severe rash involving mucus membranes or skin necrosis: No Has patient had a PCN reaction that required hospitalization: No Has patient had a PCN reaction occurring within the last 10 years: No If all of the above answers are "NO", then may proceed with Cephalosporin use.       Social History   Socioeconomic History  . Marital status: Single    Spouse name: Not on file  . Number of children: Not on file  . Years of education: Not on file  . Highest education level: Not on file  Occupational  History  . Not on file  Tobacco Use  . Smoking status: Never Smoker  . Smokeless tobacco: Never Used  Vaping Use  . Vaping Use: Never used  Substance and Sexual Activity  . Alcohol use: No  . Drug use: No  . Sexual activity: Yes  Other Topics Concern  . Not on file  Social History Narrative  . Not on file   Social Determinants of Health   Financial Resource Strain:   . Difficulty of Paying Living Expenses: Not on file  Food Insecurity:   . Worried About Charity fundraiser in the Last Year: Not on file  . Ran Out of Food in the Last Year: Not on file  Transportation Needs:   . Lack of Transportation (Medical): Not on file  . Lack of Transportation (Non-Medical): Not on file  Physical Activity:   . Days of Exercise per Week: Not on file  . Minutes of Exercise  per Session: Not on file  Stress:   . Feeling of Stress : Not on file  Social Connections:   . Frequency of Communication with Friends and Family: Not on file  . Frequency of Social Gatherings with Friends and Family: Not on file  . Attends Religious Services: Not on file  . Active Member of Clubs or Organizations: Not on file  . Attends Archivist Meetings: Not on file  . Marital Status: Not on file  Intimate Partner Violence:   . Fear of Current or Ex-Partner: Not on file  . Emotionally Abused: Not on file  . Physically Abused: Not on file  . Sexually Abused: Not on file    Physical Exam Cardiovascular:     Rate and Rhythm: Normal rate and regular rhythm.     Pulses: Normal pulses.  Pulmonary:     Effort: Pulmonary effort is normal.     Breath sounds: Normal breath sounds.  Musculoskeletal:        General: Normal range of motion.     Right lower leg: No edema.     Left lower leg: No edema.  Skin:    General: Skin is warm and dry.     Capillary Refill: Capillary refill takes less than 2 seconds.  Neurological:     Mental Status: She is alert and oriented to person, place, and time.   Psychiatric:        Mood and Affect: Mood normal.         Future Appointments  Date Time Provider [place]  07/18/2020  9:30 AM [facility] DEVICE REMOTES [facility]  08/18/2020  9:30 AM [facility] DEVICE REMOTES [facility]  09/19/2020  9:30 AM [facility] DEVICE REMOTES [facility]  10/20/2020  9:30 AM [facility] DEVICE REMOTES [facility]  11/21/2020  9:30 AM [facility] DEVICE REMOTES [facility]  12/22/2020  9:30 AM [facility] DEVICE REMOTES [facility]    BP (!) 148/79 (BP Location: Right Arm, Patient Position: Sitting, Cuff Size: Normal)   Pulse 78   Resp 18   Wt 176 lb (79.8 kg)   SpO2 98%   BMI 31.18 kg/m   Weight yesterday- did not weigh  Last visit weight- 177 lb  Ms Labus was seen at home today and reported feeling well. She denied chest pain, orthopnea, fever or cough but maintains exertional dyspnea as well as intermittent dizziness. She stated she has been compliant with her medications over the past week and her weight has been stable. Her medications were verified and her pillbox was refilled. I will follow up in two weeks.   [name], EMT 07/06/20  ACTION: Home visit completed Next visit planned for 2 weeks

## 2020-07-18 ENCOUNTER — Ambulatory Visit (INDEPENDENT_AMBULATORY_CARE_PROVIDER_SITE_OTHER): Payer: Medicare Other

## 2020-07-18 ENCOUNTER — Telehealth: Payer: Self-pay

## 2020-07-18 DIAGNOSIS — I429 Cardiomyopathy, unspecified: Secondary | ICD-10-CM

## 2020-07-18 LAB — CUP PACEART REMOTE DEVICE CHECK
Battery Remaining Longevity: 1 mo
Battery Remaining Percentage: 3 %
Battery Voltage: 2.6 V
Brady Statistic AP VP Percent: 8.9 %
Brady Statistic AP VS Percent: 1 %
Brady Statistic AS VP Percent: 90 %
Brady Statistic AS VS Percent: 1.3 %
Brady Statistic RA Percent Paced: 9 %
Date Time Interrogation Session: 20211114020015
HighPow Impedance: 62 Ohm
HighPow Impedance: 62 Ohm
Implantable Pulse Generator Implant Date: 20150915
Lead Channel Impedance Value: 310 Ohm
Lead Channel Impedance Value: 400 Ohm
Lead Channel Impedance Value: 400 Ohm
Lead Channel Pacing Threshold Amplitude: 0.5 V
Lead Channel Pacing Threshold Amplitude: 1.25 V
Lead Channel Pacing Threshold Amplitude: 1.375 V
Lead Channel Pacing Threshold Pulse Width: 0.5 ms
Lead Channel Pacing Threshold Pulse Width: 0.5 ms
Lead Channel Pacing Threshold Pulse Width: 0.8 ms
Lead Channel Sensing Intrinsic Amplitude: 11.7 mV
Lead Channel Sensing Intrinsic Amplitude: 2.6 mV
Lead Channel Setting Pacing Amplitude: 2 V
Lead Channel Setting Pacing Amplitude: 2 V
Lead Channel Setting Pacing Amplitude: 2.375
Lead Channel Setting Pacing Pulse Width: 0.5 ms
Lead Channel Setting Pacing Pulse Width: 0.6 ms
Lead Channel Setting Sensing Sensitivity: 0.5 mV
Pulse Gen Serial Number: 7186553

## 2020-07-18 NOTE — Telephone Encounter (Signed)
Pt received vibratory alert, manual transmission reflects device reached ERI today.  Pt last seen in office on 10/25, Gen changeout discussed.  Advised pt I would send a message to Dr. Curt Bears nurse to schedule changeout procedure.  Pt requests callback on her cellphone.

## 2020-07-18 NOTE — Telephone Encounter (Signed)
The pt called because her ICD vibrated. I had her send a manual transmission. I told her once she send the manual transmission should fix the vibrations. I let her speak with Amy, rn.

## 2020-07-19 ENCOUNTER — Telehealth: Payer: Self-pay | Admitting: Cardiology

## 2020-07-19 NOTE — Progress Notes (Signed)
Remote ICD transmission.   

## 2020-07-20 ENCOUNTER — Other Ambulatory Visit (HOSPITAL_COMMUNITY): Payer: Self-pay | Admitting: *Deleted

## 2020-07-20 ENCOUNTER — Other Ambulatory Visit (HOSPITAL_COMMUNITY): Payer: Self-pay

## 2020-07-20 NOTE — Progress Notes (Signed)
Paramedicine Encounter    Patient ID: Michelle Andrade, female    DOB: June 15, 1961, 59 y.o.   MRN: [id number]   Patient Care Team: Zachery Dauer, MD as PCP - General (Internal Medicine) Constance Haw, MD as PCP - Electrophysiology (Cardiology) Bensimhon, Shaune Pascal, MD as PCP - Cardiology (Cardiology) Jorge Ny, LCSW as Social Worker (Licensed Clinical Social Worker)  Patient Active Problem List   Diagnosis Date Noted  . Abnormal stress test   . Chest pain 01/05/2020  . Secondary hyperparathyroidism of renal origin ([place]) 03/20/2018  . Severe obesity (BMI 35.0-39.9) with comorbidity ([place]) 12/19/2017  . CKD (chronic kidney disease) stage 4, GFR 15-29 ml/min (HCC) 06/07/2017  . Acute on chronic systolic (congestive) heart failure ([place])   . CHF (congestive heart failure) ([place]) 02/09/2017  . Pulmonary vascular congestion 02/09/2017  . Hypertension   . Sleep apnea   . Chondrocalcinosis of both knees 02/08/2017  . Primary osteoarthritis of both knees 02/08/2017  . Type 2 diabetes mellitus ([place]) 11/16/2016  . Borderline personality disorder ([place]) 10/17/2016  . Carpal tunnel syndrome 10/17/2016  . 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 Eczema 07/03/2016  . Atrial tachycardia ([place]) 05/31/2016  . 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 Onychomycosis of toenail 06/08/2015  . Cardiomyopathy ([place]) 06/07/2015  . Irritable bowel syndrome 09/27/2014  . PAD (peripheral artery disease) ([place]) 01/12/2014  . History of DVT (deep vein thrombosis) 01/12/2014  . Acute gout of right knee 12/29/2013  . Iron deficiency anemia 12/29/2013  . Lichenification and lichen simplex chronicus 12/26/2013  . Hyperlipidemia associated with type 2 diabetes mellitus ([place]) 11/10/2013  . Benign hypertension with chronic kidney disease, stage IV ([place]) 11/10/2013  . Disorder of magnesium metabolism 10/13/2013  . CAD in  native artery 09/01/2013  . Anxiety state 07/27/2013  . Depressive disorder 07/27/2013  . Gastro-esophageal reflux disease without esophagitis 07/27/2013  . Low back pain 06/03/2013  . Mitral valve disease 06/03/2013  . Pulmonary valve disorder 06/03/2013  . Tricuspid valve disorder 06/03/2013  . 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 Allergic rhinitis 02/07/2013  . 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 Pain in joint 02/07/2013    Current Outpatient Medications:  .  allopurinol (ZYLOPRIM) 100 MG tablet, Take 200 mg by mouth daily. , Disp: , Rfl:  .  amiodarone (PACERONE) 200 MG tablet, Take 0.5 tablets (100 mg total) by mouth daily., Disp: 90 tablet, Rfl: 3 .  aspirin 81 MG chewable tablet, Chew 81 mg by mouth daily., Disp: , Rfl:  .  atorvastatin (LIPITOR) 80 MG tablet, Take 1 tablet (80 mg total) by mouth at bedtime., Disp: 90 tablet, Rfl: 3 .  Calcium Carbonate-Vitamin D 600-400 MG-UNIT tablet, Take 1 tablet by mouth in the morning. , Disp: , Rfl:  .  Calcium Carbonate-Vitamin D3 (CALCIUM 600/VITAMIN D) 600-400 MG-UNIT TABS, Take 1 tablet by mouth 2 (two) times daily. , Disp: , Rfl:  .  carvedilol (COREG) 25 MG tablet, Take 1 tablet (25 mg total) by mouth 2 (two) times daily with a meal., Disp: 180 tablet, Rfl: 3 .  colchicine 0.6 MG tablet, Take 0.6-1.2 mg by mouth See admin instructions.  (Patient not taking: Reported on 07/06/2020), Disp: , Rfl:  .  cyclobenzaprine (FLEXERIL) 5 MG tablet, Take 5 mg by mouth 3 (three) times daily as needed for muscle spasms.  (Patient not taking: Reported on 07/06/2020), Disp: , Rfl:  .  diclofenac sodium (VOLTAREN) 1 %  GEL, Apply 2 g topically 4 (four) times daily., Disp: 100 g, Rfl: 0 .  ferrous sulfate 325 (65 FE) MG tablet, Take 325 mg by mouth 3 (three) times daily. , Disp: , Rfl:  .  hydrALAZINE (APRESOLINE) 50 MG tablet, Take 1.5 tablets (75 mg total) by mouth 3 (three) times daily., Disp: 135 tablet, Rfl: 0 .  isosorbide mononitrate (IMDUR) 60  MG 24 hr tablet, Take 1 tablet (60 mg total) by mouth daily., Disp: 30 tablet, Rfl: 0 .  loratadine (CLARITIN) 10 MG tablet, Take 10 mg by mouth daily., Disp: , Rfl:  .  Magnesium Oxide 420 MG TABS, Take 840 mg by mouth daily., Disp: , Rfl:  .  Multiple Vitamin (MULTIVITAMIN) tablet, Take 1 tablet by mouth daily., Disp: , Rfl:  .  omeprazole (PRILOSEC) 40 MG capsule, Take 40 mg by mouth daily., Disp: , Rfl:  .  potassium chloride (KLOR-CON) 10 MEQ tablet, Take 4 tablets (40 mEq total) by mouth daily., Disp: 240 tablet, Rfl: 11 .  rOPINIRole (REQUIP) 0.5 MG tablet, Take 0.5 mg by mouth at bedtime. , Disp: , Rfl:  .  sertraline (ZOLOFT) 100 MG tablet, Take 150 mg by mouth at bedtime. , Disp: , Rfl:  .  torsemide (DEMADEX) 20 MG tablet, Take 1 tablet (20 mg total) by mouth 2 (two) times daily., Disp: 120 tablet, Rfl: 3 .  TRULICITY 0.35 KK/9.3GH SOPN, Inject 0.75 mg into the skin every Sunday. , Disp: , Rfl:  Allergies  Allergen Reactions  . Codeine Itching  . Gabapentin Itching  . Haldol [Haloperidol Lactate] Other (See Comments)    Dizziness, hallucinations  . Levofloxacin     Other reaction(s): Other (See Comments) Projectile vomiting   . Lisinopril Itching and Swelling  . Losartan Itching  . Morphine And Related Nausea And Vomiting  . Penicillins Nausea And Vomiting    "Vomiting, upset stomach, Headache" Has patient had a PCN reaction causing immediate rash, facial/tongue/throat swelling, SOB or lightheadedness with hypotension: No Has patient had a PCN reaction causing severe rash involving mucus membranes or skin necrosis: No Has patient had a PCN reaction that required hospitalization: No Has patient had a PCN reaction occurring within the last 10 years: No If all of the above answers are "NO", then may proceed with Cephalosporin use.       Social History   Socioeconomic History  . Marital status: Single    Spouse name: Not on file  . Number of children: Not on file  .  Years of education: Not on file  . Highest education level: Not on file  Occupational History  . Not on file  Tobacco Use  . Smoking status: Never Smoker  . Smokeless tobacco: Never Used  Vaping Use  . Vaping Use: Never used  Substance and Sexual Activity  . Alcohol use: No  . Drug use: No  . Sexual activity: Yes  Other Topics Concern  . Not on file  Social History Narrative  . Not on file   Social Determinants of Health   Financial Resource Strain:   . Difficulty of Paying Living Expenses: Not on file  Food Insecurity:   . Worried About Charity fundraiser in the Last Year: Not on file  . Ran Out of Food in the Last Year: Not on file  Transportation Needs:   . Lack of Transportation (Medical): Not on file  . Lack of Transportation (Non-Medical): Not on file  Physical Activity:   . Days  of Exercise per Week: Not on file  . Minutes of Exercise per Session: Not on file  Stress:   . Feeling of Stress : Not on file  Social Connections:   . Frequency of Communication with Friends and Family: Not on file  . Frequency of Social Gatherings with Friends and Family: Not on file  . Attends Religious Services: Not on file  . Active Member of Clubs or Organizations: Not on file  . Attends Archivist Meetings: Not on file  . Marital Status: Not on file  Intimate Partner Violence:   . Fear of Current or Ex-Partner: Not on file  . Emotionally Abused: Not on file  . Physically Abused: Not on file  . Sexually Abused: Not on file    Physical Exam Cardiovascular:     Rate and Rhythm: Normal rate and regular rhythm.     Pulses: Normal pulses.  Pulmonary:     Effort: Pulmonary effort is normal.     Breath sounds: Normal breath sounds.  Musculoskeletal:        General: Normal range of motion.     Right lower leg: No edema.     Left lower leg: No edema.  Skin:    General: Skin is warm and dry.     Capillary Refill: Capillary refill takes less than 2 seconds.   Neurological:     Mental Status: She is alert and oriented to person, place, and time.  Psychiatric:        Mood and Affect: Mood normal.         Future Appointments  Date Time Provider [place]  08/18/2020  9:30 AM [facility] DEVICE REMOTES [facility]  09/19/2020  9:30 AM [facility] DEVICE REMOTES [facility]  10/20/2020  9:30 AM [facility] DEVICE REMOTES [facility]  11/21/2020  9:30 AM [facility] DEVICE REMOTES [facility]  12/22/2020  9:30 AM [facility] DEVICE REMOTES [facility]    BP (!) 142/78 (BP Location: Left Arm, Patient Position: Sitting, Cuff Size: Normal)   Pulse 72   Resp 16   SpO2 95%   Weight yesterday- 177 lb Last visit weight- 176 lb  Ms Schuchart was seen at home today and reported feeling well. She denied chest pain, headache, increased dizziness, orthopnea, fever or cough over the past week. She stated she has been compliant with her medications over the past week and her weight has been stable. Last week he defibrillator began to vibrate which prompted her to contact the device clinic. They advised they would get her scheduled for a device swap due to the low battery life on her current one. Nothing further was noted at this time. Her medications were verified and her pillbox was refilled. We discussed discharge from the paramedicine program due to prolonged success and she was understanding. I will keep her on services until after her surgery and plan for discharge following the holidays. I will follow up in two weeks.   [name], EMT 07/20/20  ACTION: Home visit completed Next visit planned for 2 weeks

## 2020-07-22 NOTE — Telephone Encounter (Signed)
Pt agreeable to 09/09/20 for gen change. Aware I will follow up at a later time to arrange/discuss.

## 2020-07-24 NOTE — Progress Notes (Signed)
Advanced Heart Failure Clinic Note      --- Patient did not show for appointment. Note left for templating purposes only. --    Primary Cardiologist: Dr. Haroldine Laws  Nephrology: Dr Valaria Good [facility]   Reason for Visit: [facility] F/u for CP/CAD and Chronic Systolic Heart Failure  HPI:  Michelle Andrade is a 59 yo female with a past medical history of HTN, chronic systolic HF due to NICM (normal cors on cath 2014) s/p CRT-D ([employer]), atrial tachycardia (2017), CKD stage III, DM, OSA w/CPAP. 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  Admitted 4/18 with ADHF at [facility]. Echo that admission with EF 25-30%.  Her baseline creatinine appears to be 1.4-1.6 when reviewing her records from [facility]. Her last office visit with her Cardiologist Dr. Otho Perl was in January 2018 . He noted angioedema with ACE-I and cough with ARB.   Echo in 3/20 showed LVEF 25-30%   Admitted 5/21 with atypical CP. HS troponin 33>34. Had NST that was interpreted by reading provider to have several areas of ischemia/infarct concerning for multivessel CAD. She was gently hydrated prior given CKD. LHC showed moderate, nonobstructive CAD w/ 50-60% RCA, flow wire 1.0 (non-flow limiting). Medical therapy recommended.  She presents to clinic today for f/u. Doing well. Continues w/ chest wall tenderness. No ischemic chest pain. Chronically NYHA Class II-III. No resting dyspnea. Denies wt gain. No LEE. Fully compliant w/ medications.    Review of systems complete and found to be negative unless listed in HPI.    Past Medical History:  Diagnosis Date  . Arthritis 02/08/2017  . 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 CAD (coronary artery disease) 09/01/2013  . CHF (congestive heart failure) ([place]) 06/07/2015  . CKD (chronic kidney disease), stage III ([place]) 12/09/2013  . Depression 07/27/2013  . Diabetes mellitus without complication ([place])  70/26/3785  . GERD (gastroesophageal reflux disease) 07/27/2013  . Gout 12/29/2013  . Heart valve disorder 06/03/2013  . High cholesterol 11/10/2013  . Hypertension 11/10/2013  . IBS (irritable bowel syndrome) 09/27/2014  . Myocardial infarct ([place])   . Sleep apnea 10/13/2013  . Vascular disorder 01/12/2014    Current Outpatient Medications  Medication Sig Dispense Refill  . allopurinol (ZYLOPRIM) 100 MG tablet Take 200 mg by mouth daily.     [name] amiodarone (PACERONE) 200 MG tablet Take 0.5 tablets (100 mg total) by mouth daily. 90 tablet 3  . aspirin 81 MG chewable tablet Chew 81 mg by mouth daily.    [name] atorvastatin (LIPITOR) 80 MG tablet Take 1 tablet (80 mg total) by mouth at bedtime. 90 tablet 3  . Calcium Carbonate-Vitamin D 600-400 MG-UNIT tablet Take 1 tablet by mouth in the morning.  (Patient not taking: Reported on 07/20/2020)    . Calcium Carbonate-Vitamin D3 (CALCIUM 600/VITAMIN D) 600-400 MG-UNIT TABS Take 1 tablet by mouth 2 (two) times daily.     . carvedilol (COREG) 25 MG tablet Take 1 tablet (25 mg total) by mouth 2 (two) times daily with a meal. 180 tablet 3  . colchicine 0.6 MG tablet Take 0.6-1.2 mg by mouth See admin instructions.  (Patient not taking: Reported on 07/06/2020)    . cyclobenzaprine (FLEXERIL) 5 MG tablet Take 5 mg by mouth 3 (three) times daily as needed for muscle spasms.  (Patient not taking: Reported on 07/06/2020)    . diclofenac sodium (VOLTAREN) 1 % GEL Apply 2 g topically 4 (four) times daily. 100 g  0  . ferrous sulfate 325 (65 FE) MG tablet Take 325 mg by mouth 3 (three) times daily.     . hydrALAZINE (APRESOLINE) 50 MG tablet Take 1.5 tablets (75 mg total) by mouth 3 (three) times daily. 135 tablet 0  . isosorbide mononitrate (IMDUR) 60 MG 24 hr tablet Take 1 tablet (60 mg total) by mouth daily. 30 tablet 0  . loratadine (CLARITIN) 10 MG tablet Take 10 mg by mouth daily.    . Magnesium Oxide 420 MG TABS Take 840 mg by mouth daily.    . Multiple  Vitamin (MULTIVITAMIN) tablet Take 1 tablet by mouth daily.    [name] omeprazole (PRILOSEC) 40 MG capsule Take 40 mg by mouth daily.    . potassium chloride (KLOR-CON) 10 MEQ tablet Take 4 tablets (40 mEq total) by mouth daily. 240 tablet 11  . rOPINIRole (REQUIP) 0.5 MG tablet Take 0.5 mg by mouth at bedtime.     . sertraline (ZOLOFT) 100 MG tablet Take 150 mg by mouth at bedtime.     . torsemide (DEMADEX) 20 MG tablet Take 1 tablet (20 mg total) by mouth 2 (two) times daily. 120 tablet 3  . TRULICITY 6.62 HU/7.6LY SOPN Inject 0.75 mg into the skin every Sunday.      No current facility-administered medications for this encounter.   Allergies  Allergen Reactions  . Codeine Itching  . Gabapentin Itching  . Haldol [Haloperidol Lactate] Other (See Comments)    Dizziness, hallucinations  . Levofloxacin     Other reaction(s): Other (See Comments) Projectile vomiting   . Lisinopril Itching and Swelling  . Losartan Itching  . Morphine And Related Nausea And Vomiting  . Penicillins Nausea And Vomiting    "Vomiting, upset stomach, Headache" Has patient had a PCN reaction causing immediate rash, facial/tongue/throat swelling, SOB or lightheadedness with hypotension: No Has patient had a PCN reaction causing severe rash involving mucus membranes or skin necrosis: No Has patient had a PCN reaction that required hospitalization: No Has patient had a PCN reaction occurring within the last 10 years: No If all of the above answers are "NO", then may proceed with Cephalosporin use.    Social History   Socioeconomic History  . Marital status: Single    Spouse name: Not on file  . Number of children: Not on file  . Years of education: Not on file  . Highest education level: Not on file  Occupational History  . Not on file  Tobacco Use  . Smoking status: Never Smoker  . Smokeless tobacco: Never Used  Vaping Use  . Vaping Use: Never used  Substance and Sexual Activity  . Alcohol use: No  .  Drug use: No  . Sexual activity: Yes  Other Topics Concern  . Not on file  Social History Narrative  . Not on file   Social Determinants of Health   Financial Resource Strain:   . Difficulty of Paying Living Expenses: Not on file  Food Insecurity:   . Worried About Charity fundraiser in the Last Year: Not on file  . Ran Out of Food in the Last Year: Not on file  Transportation Needs:   . Lack of Transportation (Medical): Not on file  . Lack of Transportation (Non-Medical): Not on file  Physical Activity:   . Days of Exercise per Week: Not on file  . Minutes of Exercise per Session: Not on file  Stress:   . Feeling of Stress : Not on file  Social Connections:   . Frequency of Communication with Friends and Family: Not on file  . Frequency of Social Gatherings with Friends and Family: Not on file  . Attends Religious Services: Not on file  . Active Member of Clubs or Organizations: Not on file  . Attends Archivist Meetings: Not on file  . Marital Status: Not on file  Intimate Partner Violence:   . Fear of Current or Ex-Partner: Not on file  . Emotionally Abused: Not on file  . Physically Abused: Not on file  . Sexually Abused: Not on file   Family History  Problem Relation Age of Onset  . Hypertension Mother    There were no vitals filed for this visit. Wt Readings from Last 3 Encounters:  07/20/20 80.4 kg (177 lb 3.2 oz)  07/06/20 79.8 kg (176 lb)  06/27/20 80.3 kg (177 lb)    PHYSICAL EXAM: General:  Well appearing. No respiratory difficulty HEENT: normal Neck: supple. no JVD. Carotids 2+ bilat; no bruits. No lymphadenopathy or thyromegaly appreciated. Cor: PMI nondisplaced. Regular rate & rhythm. No rubs, gallops or murmurs. Lungs: clear Abdomen: soft, nontender, nondistended. No hepatosplenomegaly. No bruits or masses. Good bowel sounds. Extremities: no cyanosis, clubbing, rash, edema Neuro: alert & oriented x 3, cranial nerves grossly intact. moves  all 4 extremities w/o difficulty. Affect pleasant.    ASSESSMENT & PLAN: 1. Chronic systolic CHF:  - NICM, normal cors in 2014, likely related to HTN vs. Noncompliance vs. Tachy- medicated with history of atrial tach. - s/p STJ CRT-D - Echo 02/2017 EF 20%, moderate MR, moderate TR. PA pressure 80 mm Hg.    - Echo 11/2018 EF 25-30% - Cath 5/21: Nonobstructive CAD RCA 60% lesion - Chronic NYHA III-III - Volume status stable. Continue torsemide 40 mg BID.  - no ARB/ ANRI due to angioedema/CKD. No spiro nor digoxin due to CKD  - Continue isosorbide 60 mg daily - Continue hydralazine 75 mg tid  - Continue carvedilol 25 mg BID.   2. CAD. Non-obstructive - LHC 01/2020 50-60% RCA, negative FFR (non-flow limiting) - continue medical therapy w/ ASA + Statin+ Imdur +  blocker  3. H - continue atorva 80 - goal LDL < 70 - followed by PCP  4. CKD stage IIIa: Baseline creatinine 1.3-1.6.  - check BMP today   5. HTN - mildly elevated - Increase Coreg to 25 mg daily   6. History of atrial tachycardia -  Followed by Dr. Curt Bears - Continue amiodarone 200 mg daily  -  LFTs and TFTS recently checked and normal (presonally reviewed) -  reminded to get annual eye exams   Glori Bickers, MD 07/24/20

## 2020-07-25 ENCOUNTER — Inpatient Hospital Stay (HOSPITAL_BASED_OUTPATIENT_CLINIC_OR_DEPARTMENT_OTHER)
Admission: RE | Admit: 2020-07-25 | Discharge: 2020-07-25 | Disposition: A | Discharge status: Home / Self Care | Payer: Medicare Other | Source: Ambulatory Visit | Attending: Internal Medicine | Admitting: Internal Medicine

## 2020-07-25 DIAGNOSIS — I5022 Chronic systolic (congestive) heart failure: Secondary | ICD-10-CM

## 2020-08-03 ENCOUNTER — Other Ambulatory Visit (HOSPITAL_COMMUNITY): Payer: Self-pay

## 2020-08-03 NOTE — Progress Notes (Signed)
Paramedicine Encounter    Patient ID: Michelle Andrade, female    DOB: 1961/04/30, 59 y.o.   MRN: [id number]   Patient Care Team: Zachery Dauer, MD as PCP - General (Internal Medicine) Constance Haw, MD as PCP - Electrophysiology (Cardiology) Bensimhon, Shaune Pascal, MD as PCP - Cardiology (Cardiology) Jorge Ny, LCSW as Social Worker (Licensed Clinical Social Worker)  Patient Active Problem List   Diagnosis Date Noted  . Abnormal stress test   . Chest pain 01/05/2020  . Secondary hyperparathyroidism of renal origin ([place]) 03/20/2018  . Severe obesity (BMI 35.0-39.9) with comorbidity ([place]) 12/19/2017  . CKD (chronic kidney disease) stage 4, GFR 15-29 ml/min (HCC) 06/07/2017  . Acute on chronic systolic (congestive) heart failure ([place])   . CHF (congestive heart failure) ([place]) 02/09/2017  . Pulmonary vascular congestion 02/09/2017  . Hypertension   . Sleep apnea   . Chondrocalcinosis of both knees 02/08/2017  . Primary osteoarthritis of both knees 02/08/2017  . Type 2 diabetes mellitus ([place]) 11/16/2016  . Borderline personality disorder ([place]) 10/17/2016  . Carpal tunnel syndrome 10/17/2016  . 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 Eczema 07/03/2016  . Atrial tachycardia ([place]) 05/31/2016  . 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 Onychomycosis of toenail 06/08/2015  . Cardiomyopathy ([place]) 06/07/2015  . Irritable bowel syndrome 09/27/2014  . PAD (peripheral artery disease) ([place]) 01/12/2014  . History of DVT (deep vein thrombosis) 01/12/2014  . Acute gout of right knee 12/29/2013  . Iron deficiency anemia 12/29/2013  . Lichenification and lichen simplex chronicus 12/26/2013  . Hyperlipidemia associated with type 2 diabetes mellitus ([place]) 11/10/2013  . Benign hypertension with chronic kidney disease, stage IV ([place]) 11/10/2013  . Disorder of magnesium metabolism 10/13/2013  . CAD in  native artery 09/01/2013  . Anxiety state 07/27/2013  . Depressive disorder 07/27/2013  . Gastro-esophageal reflux disease without esophagitis 07/27/2013  . Low back pain 06/03/2013  . Mitral valve disease 06/03/2013  . Pulmonary valve disorder 06/03/2013  . Tricuspid valve disorder 06/03/2013  . 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 Allergic rhinitis 02/07/2013  . 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 Pain in joint 02/07/2013    Current Outpatient Medications:  .  allopurinol (ZYLOPRIM) 100 MG tablet, Take 200 mg by mouth daily. , Disp: , Rfl:  .  amiodarone (PACERONE) 200 MG tablet, Take 0.5 tablets (100 mg total) by mouth daily., Disp: 90 tablet, Rfl: 3 .  aspirin 81 MG chewable tablet, Chew 81 mg by mouth daily., Disp: , Rfl:  .  atorvastatin (LIPITOR) 80 MG tablet, Take 1 tablet (80 mg total) by mouth at bedtime., Disp: 90 tablet, Rfl: 3 .  Calcium Carbonate-Vitamin D 600-400 MG-UNIT tablet, Take 1 tablet by mouth in the morning.  (Patient not taking: Reported on 07/20/2020), Disp: , Rfl:  .  Calcium Carbonate-Vitamin D3 (CALCIUM 600/VITAMIN D) 600-400 MG-UNIT TABS, Take 1 tablet by mouth 2 (two) times daily. , Disp: , Rfl:  .  carvedilol (COREG) 25 MG tablet, Take 1 tablet (25 mg total) by mouth 2 (two) times daily with a meal., Disp: 180 tablet, Rfl: 3 .  colchicine 0.6 MG tablet, Take 0.6-1.2 mg by mouth See admin instructions.  (Patient not taking: Reported on 07/06/2020), Disp: , Rfl:  .  cyclobenzaprine (FLEXERIL) 5 MG tablet, Take 5 mg by mouth 3 (three) times daily as needed for muscle spasms.  (Patient not taking: Reported on 07/06/2020), Disp: , Rfl:  .  diclofenac sodium (VOLTAREN) 1 % GEL, Apply 2 g topically 4 (four) times daily., Disp: 100 g, Rfl: 0 .  ferrous sulfate 325 (65 FE) MG tablet, Take 325 mg by mouth 3 (three) times daily. , Disp: , Rfl:  .  hydrALAZINE (APRESOLINE) 50 MG tablet, Take 1.5 tablets (75 mg total) by mouth 3 (three) times daily., Disp: 135 tablet,  Rfl: 0 .  isosorbide mononitrate (IMDUR) 60 MG 24 hr tablet, Take 1 tablet (60 mg total) by mouth daily., Disp: 30 tablet, Rfl: 0 .  loratadine (CLARITIN) 10 MG tablet, Take 10 mg by mouth daily., Disp: , Rfl:  .  Magnesium Oxide 420 MG TABS, Take 840 mg by mouth daily., Disp: , Rfl:  .  Multiple Vitamin (MULTIVITAMIN) tablet, Take 1 tablet by mouth daily., Disp: , Rfl:  .  omeprazole (PRILOSEC) 40 MG capsule, Take 40 mg by mouth daily., Disp: , Rfl:  .  potassium chloride (KLOR-CON) 10 MEQ tablet, Take 4 tablets (40 mEq total) by mouth daily., Disp: 240 tablet, Rfl: 11 .  rOPINIRole (REQUIP) 0.5 MG tablet, Take 0.5 mg by mouth at bedtime. , Disp: , Rfl:  .  sertraline (ZOLOFT) 100 MG tablet, Take 150 mg by mouth at bedtime. , Disp: , Rfl:  .  torsemide (DEMADEX) 20 MG tablet, Take 1 tablet (20 mg total) by mouth 2 (two) times daily., Disp: 120 tablet, Rfl: 3 .  TRULICITY 1.61 WR/6.0AV SOPN, Inject 0.75 mg into the skin every Sunday. , Disp: , Rfl:  Allergies  Allergen Reactions  . Codeine Itching  . Gabapentin Itching  . Haldol [Haloperidol Lactate] Other (See Comments)    Dizziness, hallucinations  . Levofloxacin     Other reaction(s): Other (See Comments) Projectile vomiting   . Lisinopril Itching and Swelling  . Losartan Itching  . Morphine And Related Nausea And Vomiting  . Penicillins Nausea And Vomiting    "Vomiting, upset stomach, Headache" Has patient had a PCN reaction causing immediate rash, facial/tongue/throat swelling, SOB or lightheadedness with hypotension: No Has patient had a PCN reaction causing severe rash involving mucus membranes or skin necrosis: No Has patient had a PCN reaction that required hospitalization: No Has patient had a PCN reaction occurring within the last 10 years: No If all of the above answers are "NO", then may proceed with Cephalosporin use.       Social History   Socioeconomic History  . Marital status: Single    Spouse name: Not on  file  . Number of children: Not on file  . Years of education: Not on file  . Highest education level: Not on file  Occupational History  . Not on file  Tobacco Use  . Smoking status: Never Smoker  . Smokeless tobacco: Never Used  Vaping Use  . Vaping Use: Never used  Substance and Sexual Activity  . Alcohol use: No  . Drug use: No  . Sexual activity: Yes  Other Topics Concern  . Not on file  Social History Narrative  . Not on file   Social Determinants of Health   Financial Resource Strain:   . Difficulty of Paying Living Expenses: Not on file  Food Insecurity:   . Worried About Charity fundraiser in the Last Year: Not on file  . Ran Out of Food in the Last Year: Not on file  Transportation Needs:   . Lack of Transportation (Medical): Not on file  . Lack of Transportation (Non-Medical): Not on file  Physical  Activity:   . Days of Exercise per Week: Not on file  . Minutes of Exercise per Session: Not on file  Stress:   . Feeling of Stress : Not on file  Social Connections:   . Frequency of Communication with Friends and Family: Not on file  . Frequency of Social Gatherings with Friends and Family: Not on file  . Attends Religious Services: Not on file  . Active Member of Clubs or Organizations: Not on file  . Attends Archivist Meetings: Not on file  . Marital Status: Not on file  Intimate Partner Violence:   . Fear of Current or Ex-Partner: Not on file  . Emotionally Abused: Not on file  . Physically Abused: Not on file  . Sexually Abused: Not on file    Physical Exam Cardiovascular:     Rate and Rhythm: Normal rate and regular rhythm.     Pulses: Normal pulses.  Pulmonary:     Effort: Pulmonary effort is normal.     Breath sounds: Normal breath sounds.  Musculoskeletal:        General: Normal range of motion.     Right lower leg: No edema.     Left lower leg: No edema.  Skin:    General: Skin is warm and dry.     Capillary Refill: Capillary  refill takes less than 2 seconds.  Neurological:     Mental Status: Michelle Andrade is alert and oriented to person, place, and time.  Psychiatric:        Mood and Affect: Mood normal.         Future Appointments  Date Time Provider [place]  08/11/2020  3:20 PM Bensimhon, Shaune Pascal, MD [facility] None  08/18/2020  9:30 AM [facility] DEVICE REMOTES [facility]  09/19/2020  9:30 AM [facility] DEVICE REMOTES [facility]  10/20/2020  9:30 AM [facility] DEVICE REMOTES [facility]  11/21/2020  9:30 AM [facility] DEVICE REMOTES [facility]  12/22/2020  9:30 AM [facility] DEVICE REMOTES [facility]    BP 131/67 (BP Location: Left Arm, Patient Position: Sitting, Cuff Size: Normal)   Pulse 68   Resp 16   Wt 177 lb 3.2 oz (80.4 kg)   SpO2 98%   BMI 31.39 kg/m   Weight yesterday- 177.2 lb Last visit weight- 177.2 lb  Michelle Andrade was seen at home today and reported feeling well. Michelle Andrade denied chest pain, SOB, headache, dizziness, orthopnea, fever or cough. Michelle Andrade stated Michelle Andrade has been compliant with her medications and her weight has been stable. Her medications were verified and her pillbox was refilled. I will follow up in two weeks unless it becomes necessary sooner.   [name], EMT 08/03/20  ACTION: Home visit completed Next visit planned for 2 week

## 2020-08-11 ENCOUNTER — Ambulatory Visit (HOSPITAL_COMMUNITY)
Admission: RE | Admit: 2020-08-11 | Discharge: 2020-08-11 | Disposition: A | Discharge status: Home / Self Care | Payer: Medicare Other | Source: Ambulatory Visit | Attending: Internal Medicine | Admitting: Internal Medicine

## 2020-08-11 ENCOUNTER — Encounter (HOSPITAL_COMMUNITY): Payer: Self-pay | Admitting: Internal Medicine

## 2020-08-11 ENCOUNTER — Other Ambulatory Visit (HOSPITAL_COMMUNITY): Payer: Self-pay

## 2020-08-11 ENCOUNTER — Other Ambulatory Visit: Payer: Self-pay

## 2020-08-11 VITALS — BP 164/100 | HR 82 | Wt 180.8 lb

## 2020-08-11 DIAGNOSIS — N1831 Chronic kidney disease, stage 3a: Secondary | ICD-10-CM | POA: Diagnosis not present

## 2020-08-11 DIAGNOSIS — Z881 Allergy status to other antibiotic agents status: Secondary | ICD-10-CM | POA: Insufficient documentation

## 2020-08-11 DIAGNOSIS — I130 Hypertensive heart and chronic kidney disease with heart failure and stage 1 through stage 4 chronic kidney disease, or unspecified chronic kidney disease: Secondary | ICD-10-CM | POA: Diagnosis present

## 2020-08-11 DIAGNOSIS — Z7984 Long term (current) use of oral hypoglycemic drugs: Secondary | ICD-10-CM | POA: Insufficient documentation

## 2020-08-11 DIAGNOSIS — Z7982 Long term (current) use of aspirin: Secondary | ICD-10-CM | POA: Insufficient documentation

## 2020-08-11 DIAGNOSIS — Z888 Allergy status to other drugs, medicaments and biological substances status: Secondary | ICD-10-CM | POA: Diagnosis not present

## 2020-08-11 DIAGNOSIS — I428 Other cardiomyopathies: Secondary | ICD-10-CM | POA: Insufficient documentation

## 2020-08-11 DIAGNOSIS — Z79899 Other long term (current) drug therapy: Secondary | ICD-10-CM | POA: Insufficient documentation

## 2020-08-11 DIAGNOSIS — Z95810 Presence of automatic (implantable) cardiac defibrillator: Secondary | ICD-10-CM | POA: Insufficient documentation

## 2020-08-11 DIAGNOSIS — Z8249 Family history of ischemic heart disease and other diseases of the circulatory system: Secondary | ICD-10-CM | POA: Insufficient documentation

## 2020-08-11 DIAGNOSIS — G4733 Obstructive sleep apnea (adult) (pediatric): Secondary | ICD-10-CM | POA: Diagnosis not present

## 2020-08-11 DIAGNOSIS — Z885 Allergy status to narcotic agent status: Secondary | ICD-10-CM | POA: Diagnosis not present

## 2020-08-11 DIAGNOSIS — Z880 Allergy status to penicillin: Secondary | ICD-10-CM | POA: Insufficient documentation

## 2020-08-11 DIAGNOSIS — I10 Essential (primary) hypertension: Secondary | ICD-10-CM

## 2020-08-11 DIAGNOSIS — R42 Dizziness and giddiness: Secondary | ICD-10-CM | POA: Diagnosis not present

## 2020-08-11 DIAGNOSIS — I5022 Chronic systolic (congestive) heart failure: Secondary | ICD-10-CM | POA: Diagnosis not present

## 2020-08-11 DIAGNOSIS — I471 Supraventricular tachycardia: Secondary | ICD-10-CM | POA: Insufficient documentation

## 2020-08-11 DIAGNOSIS — R0789 Other chest pain: Secondary | ICD-10-CM | POA: Diagnosis not present

## 2020-08-11 DIAGNOSIS — I2510 Atherosclerotic heart disease of native coronary artery without angina pectoris: Secondary | ICD-10-CM | POA: Insufficient documentation

## 2020-08-11 DIAGNOSIS — E1122 Type 2 diabetes mellitus with diabetic chronic kidney disease: Secondary | ICD-10-CM | POA: Insufficient documentation

## 2020-08-11 DIAGNOSIS — E785 Hyperlipidemia, unspecified: Secondary | ICD-10-CM | POA: Insufficient documentation

## 2020-08-11 LAB — BASIC METABOLIC PANEL
Anion gap: 11 (ref 5–15)
BUN: 17 mg/dL (ref 8–23)
CO2: 26 mmol/L (ref 22–32)
Calcium: 8.7 mg/dL — ABNORMAL LOW (ref 8.9–10.3)
Chloride: 102 mmol/L (ref 98–111)
Creatinine, Ser: 1.58 mg/dL — ABNORMAL HIGH (ref 0.44–1.00)
GFR, Estimated: 37 mL/min — ABNORMAL LOW (ref >60)
Glucose, Bld: 98 mg/dL (ref 70–99)
Potassium: 3.9 mmol/L (ref 3.5–5.1)
Sodium: 139 mmol/L (ref 135–145)

## 2020-08-11 MED ORDER — EMPAGLIFLOZIN 10 MG PO TABS: 10.0000 mg | ORAL_TABLET | Freq: Every day | ORAL | 0 refills | Status: DC

## 2020-08-11 MED ORDER — DAPAGLIFLOZIN PROPANEDIOL 10 MG PO TABS: 10.0000 mg | ORAL_TABLET | Freq: Every day | ORAL | 11 refills | Status: DC

## 2020-08-11 MED ORDER — DAPAGLIFLOZIN PROPANEDIOL 10 MG PO TABS
10.0000 mg | ORAL_TABLET | Freq: Every day | ORAL | 0 refills | Status: DC
Start: 2020-08-11 — End: 2020-08-11

## 2020-08-11 NOTE — Addendum Note (Signed)
Encounter addended by: Jolaine Artist, MD on: 08/11/2020 7:09 PM  Actions taken: Level of Service modified, Visit diagnoses modified, Charge Capture section accepted

## 2020-08-11 NOTE — Progress Notes (Signed)
Advanced Heart Failure Clinic Note   Primary Cardiologist: Dr. Haroldine Laws  Nephrology: Dr Valaria Good [facility].   Reason for Visit: [facility] F/u for CP/CAD and Chronic Systolic Heart Failure  HPI:  Ms. Heckert is a 59 yo female with a past medical history of HTN, chronic systolic HF due to NICM (normal cors on cath 2014) s/p CRT-D ([employer]), atrial tachycardia (2017), CKD stage III, DM, OSA w/CPAP. 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  Admitted 4/18 with ADHF at [facility]. Echo that admission with EF 25-30%.  Her baseline creatinine appears to be 1.4-1.6 when reviewing her records from [facility]. Her last office visit with her Cardiologist Dr. Otho Perl was in January 2018 . He noted angioedema with ACE-I and cough with ARB.   Echo 3/20 LVEF 25-30%   Admitted 5/21 with atypical CP. HS troponin 33>34. Had NST that was interpreted by reading provider to have several areas of ischemia/infarct concerning for multivessel CAD. She was gently hydrated prior given CKD. LHC showed moderate, nonobstructive CAD w/ 50-60% RCA, flow wire 1.0 (non-flow limiting). Medical therapy recommended.  She presents to clinic today for f/u. Doing well, has some mild fatigue. Following with Paramedicine. Some dizziness with standing, no PND. Continues w/ chest wall tenderness over ICD site, due for ICD generator change. No ischemic chest pain. NYHA Class II. No resting dyspnea. Denies wt gain. No edema. Drinking >2L daily and not watching salt intake. Fully compliant w/ medications. Doesn't wear cpap. Weights at home ~177 lbs.   Review of systems complete and found to be negative unless listed in HPI.    Past Medical History:  Diagnosis Date  . Arthritis 02/08/2017  . 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 CAD (coronary artery disease) 09/01/2013  . CHF (congestive heart failure) ([place]) 06/07/2015  . CKD (chronic kidney disease),  stage III ([place]) 12/09/2013  . Depression 07/27/2013  . Diabetes mellitus without complication ([place]) 78/29/5621  . GERD (gastroesophageal reflux disease) 07/27/2013  . Gout 12/29/2013  . Heart valve disorder 06/03/2013  . High cholesterol 11/10/2013  . Hypertension 11/10/2013  . IBS (irritable bowel syndrome) 09/27/2014  . Myocardial infarct ([place])   . Sleep apnea 10/13/2013  . Vascular disorder 01/12/2014    Current Outpatient Medications  Medication Sig Dispense Refill  . allopurinol (ZYLOPRIM) 100 MG tablet Take 200 mg by mouth daily.     [name] amiodarone (PACERONE) 200 MG tablet Take 0.5 tablets (100 mg total) by mouth daily. 90 tablet 3  . aspirin 81 MG chewable tablet Chew 81 mg by mouth daily.    [name] atorvastatin (LIPITOR) 80 MG tablet Take 1 tablet (80 mg total) by mouth at bedtime. 90 tablet 3  . Calcium Carbonate-Vitamin D 600-400 MG-UNIT tablet Take 1 tablet by mouth in the morning.    . Calcium Carbonate-Vitamin D3 (CALCIUM 600/VITAMIN D) 600-400 MG-UNIT TABS Take 1 tablet by mouth 2 (two) times daily.     . carvedilol (COREG) 25 MG tablet Take 1 tablet (25 mg total) by mouth 2 (two) times daily with a meal. 180 tablet 3  . colchicine 0.6 MG tablet Take 0.6-1.2 mg by mouth See admin instructions.    . cyclobenzaprine (FLEXERIL) 5 MG tablet Take 5 mg by mouth 3 (three) times daily as needed for muscle spasms.    . diclofenac sodium (VOLTAREN) 1 % GEL Apply 2 g topically 4 (four) times daily. 100 g 0  . ferrous sulfate 325 (65 FE)  MG tablet Take 325 mg by mouth 3 (three) times daily.     . hydrALAZINE (APRESOLINE) 50 MG tablet Take 1.5 tablets (75 mg total) by mouth 3 (three) times daily. 135 tablet 0  . isosorbide mononitrate (IMDUR) 60 MG 24 hr tablet Take 1 tablet (60 mg total) by mouth daily. 30 tablet 0  . loratadine (CLARITIN) 10 MG tablet Take 10 mg by mouth daily.    . Magnesium Oxide 420 MG TABS Take 840 mg by mouth daily.    . Multiple Vitamin (MULTIVITAMIN) tablet Take 1  tablet by mouth daily.    [name] omeprazole (PRILOSEC) 40 MG capsule Take 40 mg by mouth daily.    . potassium chloride (KLOR-CON) 10 MEQ tablet Take 4 tablets (40 mEq total) by mouth daily. 240 tablet 11  . rOPINIRole (REQUIP) 0.5 MG tablet Take 0.5 mg by mouth at bedtime.     . sertraline (ZOLOFT) 100 MG tablet Take 150 mg by mouth at bedtime.     . torsemide (DEMADEX) 20 MG tablet Take 1 tablet (20 mg total) by mouth 2 (two) times daily. 120 tablet 3  . TRULICITY 1.19 JY/7.8GN SOPN Inject 0.75 mg into the skin every Sunday.     . dapagliflozin propanediol (FARXIGA) 10 MG TABS tablet Take 1 tablet (10 mg total) by mouth daily before breakfast. 30 tablet 11   No current facility-administered medications for this encounter.   Allergies  Allergen Reactions  . Codeine Itching  . Gabapentin Itching  . Haldol [Haloperidol Lactate] Other (See Comments)    Dizziness, hallucinations  . Levofloxacin     Other reaction(s): Other (See Comments) Projectile vomiting   . Lisinopril Itching and Swelling  . Losartan Itching  . Morphine And Related Nausea And Vomiting  . Penicillins Nausea And Vomiting    "Vomiting, upset stomach, Headache" Has patient had a PCN reaction causing immediate rash, facial/tongue/throat swelling, SOB or lightheadedness with hypotension: No Has patient had a PCN reaction causing severe rash involving mucus membranes or skin necrosis: No Has patient had a PCN reaction that required hospitalization: No Has patient had a PCN reaction occurring within the last 10 years: No If all of the above answers are "NO", then may proceed with Cephalosporin use.    Social History   Socioeconomic History  . Marital status: Single    Spouse name: Not on file  . Number of children: Not on file  . Years of education: Not on file  . Highest education level: Not on file  Occupational History  . Not on file  Tobacco Use  . Smoking status: Never Smoker  . Smokeless tobacco: Never Used   Vaping Use  . Vaping Use: Never used  Substance and Sexual Activity  . Alcohol use: No  . Drug use: No  . Sexual activity: Yes  Other Topics Concern  . Not on file  Social History Narrative  . Not on file   Social Determinants of Health   Financial Resource Strain: Not on file  Food Insecurity: Not on file  Transportation Needs: Not on file  Physical Activity: Not on file  Stress: Not on file  Social Connections: Not on file  Intimate Partner Violence: Not on file   Family History  Problem Relation Age of Onset  . Hypertension Mother    Vitals:   08/11/20 1548  BP: (!) 164/100  Pulse: 82  SpO2: 98%  Weight: 82 kg (180 lb 12.8 oz)   Wt Readings from Last 3  Encounters:  08/11/20 82 kg (180 lb 12.8 oz)  08/03/20 80.4 kg (177 lb 3.2 oz)  07/20/20 80.4 kg (177 lb 3.2 oz)    ICD interrogation: impedence just below threshold, no AT/AF, daily activity ~2-4 hrs (personally reviewed).  PHYSICAL EXAM: General:  Well appearing. No respiratory difficulty HEENT: normal Neck: supple. no JVD. Carotids 2+ bilat; no bruits. No lymphadenopathy or thyromegaly appreciated. Cor: PMI nondisplaced. Regular rate & rhythm. No rubs, gallops or murmurs. Lungs: clear Abdomen: obese, soft, nontender, nondistended. No hepatosplenomegaly. No bruits or masses. Good bowel sounds. Extremities: no cyanosis, clubbing, rash, edema Neuro: alert & oriented x 3, cranial nerves grossly intact. moves all 4 extremities w/o difficulty. Affect pleasant.   ASSESSMENT & PLAN: 1. Chronic systolic CHF:  - NICM, normal cors in 2014, likely related to HTN vs. Noncompliance vs. Tachy- medicated with history of atrial tach. - s/p STJ CRT-D - Echo 02/2017 EF 20%, moderate MR, moderate TR. PA pressure 80 mm Hg.    - Echo 11/2018 EF 25-30% - Cath 5/21: Nonobstructive CAD RCA 60% lesion - NYHA II, volume stable today on exam and CorVue - Continue torsemide 20 mg BID.  - No ARB/ ANRI due to angioedema/CKD.  - No  spiro nor digoxin due to CKD.  - Continue isosorbide 60 mg daily. - Continue hydralazine 75 mg tid.  - Continue carvedilol 25 mg BID.  - Start Farxiga 10 mg daily; may need to decrease torsemide to once daily if dizziness occurs. - BMET today. - repeat Echo.  2. CAD. Non-obstructive - LHC 01/2020 50-60% RCA, negative FFR (non-flow limiting). - no s/s angina - continue medical therapy w/ ASA + Statin+ Imdur +  blocker.  3. HLD - continue atorva 80. - goal LDL < 70. - followed by PCP.  4. CKD stage IIIa  - Baseline creatinine 1.3-1.6.  - follows with Dr. Aquilla Hacker. - check BMP today.  5. HTN - elevated today, stable with biweekly Paramedicine checks - Continue Coreg to 25 mg daily.  - consider addition of amlodipine if remains elevated.  6. History of atrial tachycardia -  Followed by Dr. Curt Bears; ICD generator change scheduled for Jan 2022. -  Continue amiodarone 100 mg daily.  -  LFTs and TFTS normal on 10/21 (presonally reviewed) -  reminded to get annual eye exams; last one in May   Glori Bickers, MD 08/11/20

## 2020-08-11 NOTE — Patient Instructions (Signed)
Start Farxiga 10 mg (1 tab) daily  Labs done today, your results will be available in MyChart, we will contact you for abnormal readings.   Your physician recommends that you schedule a follow-up appointment in: 4 months  Your physician has requested that you have an echocardiogram. Echocardiography is a painless test that uses sound waves to create images of your heart. It provides your doctor with information about the size and shape of your heart and how well your heart's chambers and valves are working. This procedure takes approximately one hour. There are no restrictions for this procedure.   If you have any questions or concerns before your next appointment please send [place] a message through [place] or call our office at [phone].    TO LEAVE A MESSAGE FOR THE NURSE SELECT OPTION 2, PLEASE LEAVE A MESSAGE INCLUDING: . YOUR NAME . DATE OF BIRTH . CALL BACK NUMBER . REASON FOR CALL**this is important as we prioritize the call backs  [place] AS LONG AS YOU CALL BEFORE 4:00 PM   At the [facility], you and your health needs are our priority. As part of our continuing mission to provide you with exceptional heart care, we have created designated Provider Care Teams. These Care Teams include your primary Cardiologist (physician) and Advanced Practice Providers (APPs- Physician Assistants and Nurse Practitioners) who all work together to provide you with the care you need, when you need it.   You may see any of the following providers on your designated Care Team at your next follow up: [name] Dr Glori Bickers . Dr Loralie Champagne . Darrick Grinder, NP . Lyda Jester, PA . Audry Riles, PharmD   Please be sure to bring in all your medications bottles to every appointment.

## 2020-08-11 NOTE — Progress Notes (Signed)
Paramedicine Encounter   Patient ID: Michelle Andrade , female,   DOB: May 04, 1961,59 y.o.,  MRN: [id number]  [name] was seen in the HF clinic today with Dr Haroldine Laws and reported feeling well. Per clinic staff her BP was elevated today but it seems well controled at home. Today Dr Haroldine Laws started he on Farxiga 10 mg daily. She was given a paper prescription to take to the [place] and an electronic prescription was sent to her local pharmacy so she can get started on it. Nothing further was needed at this time. I will follow up next week.   [name], EMT 08/11/2020

## 2020-08-12 ENCOUNTER — Telehealth (HOSPITAL_COMMUNITY): Payer: Self-pay | Admitting: *Deleted

## 2020-08-12 ENCOUNTER — Telehealth (HOSPITAL_COMMUNITY): Payer: Self-pay | Admitting: Pharmacy Technician

## 2020-08-12 MED ORDER — EMPAGLIFLOZIN 10 MG PO TABS
10.0000 mg | ORAL_TABLET | Freq: Every day | ORAL | 6 refills | Status: DC
Start: 2020-08-12 — End: 2020-08-12

## 2020-08-12 MED ORDER — EMPAGLIFLOZIN 10 MG PO TABS: 10.0000 mg | ORAL_TABLET | Freq: Every day | ORAL | 6 refills | Status: DC

## 2020-08-12 NOTE — Telephone Encounter (Addendum)
Patient Advocate Encounter   Received notification from [employer] that prior authorization for Michelle Andrade is required. PA sent and approved.   PA submitted on CoverMyMeds [name]  PA [id number]  Co-pay $24  Called and made patient aware of the change and informed her that the RX will be mailed to her to take to the [place].  [name], CPhT

## 2020-08-12 NOTE — Telephone Encounter (Signed)
rx for [employer] and mailed to pt to take to [facility]

## 2020-08-12 NOTE — Addendum Note (Signed)
Addended by: Scarlette Calico on: 08/12/2020 12:56 PM   Modules accepted: Orders

## 2020-08-12 NOTE — Telephone Encounter (Signed)
Insurance will not cover Farxiga, med switched to Jardiance 10 mg instead

## 2020-08-15 ENCOUNTER — Telehealth: Payer: Self-pay | Admitting: Cardiology

## 2020-08-15 DIAGNOSIS — Z01812 Encounter for preprocedural laboratory examination: Secondary | ICD-10-CM

## 2020-08-15 DIAGNOSIS — I428 Other cardiomyopathies: Secondary | ICD-10-CM

## 2020-08-15 NOTE — Telephone Encounter (Signed)
Patient is returning Sherri's call. Also, she wanted to let Sherri know that the first number she called belongs to her brother. The number provided is her correct personal number.

## 2020-08-15 NOTE — Telephone Encounter (Signed)
Pt will stop by the [facility] office today for CBC blood work needed for upcoming procedure next month. Pt agreeable to plan.

## 2020-08-16 LAB — CBC
Hematocrit: 36.6 % (ref 34.0–46.6)
Hemoglobin: 12.5 g/dL (ref 11.1–15.9)
MCH: 32.3 pg (ref 26.6–33.0)
MCHC: 34.2 g/dL (ref 31.5–35.7)
MCV: 95 fL (ref 79–97)
Platelets: 207 10*3/uL (ref 150–450)
RBC: 3.87 x10E6/uL (ref 3.77–5.28)
RDW: 13.1 % (ref 11.7–15.4)
WBC: 4.7 10*3/uL (ref 3.4–10.8)

## 2020-08-17 ENCOUNTER — Other Ambulatory Visit (HOSPITAL_COMMUNITY): Payer: Self-pay

## 2020-08-17 LAB — CUP PACEART REMOTE DEVICE CHECK
Battery Remaining Longevity: 0 mo
Battery Voltage: 2.59 V
Brady Statistic AP VP Percent: 9.1 %
Brady Statistic AP VS Percent: 1 %
Brady Statistic AS VP Percent: 90 %
Brady Statistic AS VS Percent: 1.2 %
Brady Statistic RA Percent Paced: 9.2 %
Date Time Interrogation Session: 20211215020015
HighPow Impedance: 61 Ohm
HighPow Impedance: 61 Ohm
Implantable Pulse Generator Implant Date: 20150915
Lead Channel Impedance Value: 290 Ohm
Lead Channel Impedance Value: 380 Ohm
Lead Channel Impedance Value: 400 Ohm
Lead Channel Pacing Threshold Amplitude: 0.5 V
Lead Channel Pacing Threshold Amplitude: 1.25 V
Lead Channel Pacing Threshold Amplitude: 1.5 V
Lead Channel Pacing Threshold Pulse Width: 0.5 ms
Lead Channel Pacing Threshold Pulse Width: 0.5 ms
Lead Channel Pacing Threshold Pulse Width: 0.8 ms
Lead Channel Sensing Intrinsic Amplitude: 11.7 mV
Lead Channel Sensing Intrinsic Amplitude: 2.3 mV
Lead Channel Setting Pacing Amplitude: 2 V
Lead Channel Setting Pacing Amplitude: 2 V
Lead Channel Setting Pacing Amplitude: 2.5 V
Lead Channel Setting Pacing Pulse Width: 0.5 ms
Lead Channel Setting Pacing Pulse Width: 0.6 ms
Lead Channel Setting Sensing Sensitivity: 0.5 mV
Pulse Gen Serial Number: 7186553

## 2020-08-17 NOTE — Progress Notes (Signed)
Paramedicine Encounter    Patient ID: Michelle Andrade, female    DOB: 1960/11/18, 59 y.o.   MRN: [id number]   Patient Care Team: Zachery Dauer, MD as PCP - General (Internal Medicine) Constance Haw, MD as PCP - Electrophysiology (Cardiology) Bensimhon, Shaune Pascal, MD as PCP - Cardiology (Cardiology) Jorge Ny, LCSW as Social Worker (Licensed Clinical Social Worker)  Patient Active Problem List   Diagnosis Date Noted  . Abnormal stress test   . Chest pain 01/05/2020  . Secondary hyperparathyroidism of renal origin ([place]) 03/20/2018  . Severe obesity (BMI 35.0-39.9) with comorbidity ([place]) 12/19/2017  . CKD (chronic kidney disease) stage 4, GFR 15-29 ml/min (HCC) 06/07/2017  . Acute on chronic systolic (congestive) heart failure ([place])   . CHF (congestive heart failure) ([place]) 02/09/2017  . Pulmonary vascular congestion 02/09/2017  . Hypertension   . Sleep apnea   . Chondrocalcinosis of both knees 02/08/2017  . Primary osteoarthritis of both knees 02/08/2017  . Type 2 diabetes mellitus ([place]) 11/16/2016  . Borderline personality disorder ([place]) 10/17/2016  . Carpal tunnel syndrome 10/17/2016  . 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 Eczema 07/03/2016  . Atrial tachycardia ([place]) 05/31/2016  . 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 Onychomycosis of toenail 06/08/2015  . Cardiomyopathy ([place]) 06/07/2015  . Irritable bowel syndrome 09/27/2014  . PAD (peripheral artery disease) ([place]) 01/12/2014  . History of DVT (deep vein thrombosis) 01/12/2014  . Acute gout of right knee 12/29/2013  . Iron deficiency anemia 12/29/2013  . Lichenification and lichen simplex chronicus 12/26/2013  . Hyperlipidemia associated with type 2 diabetes mellitus ([place]) 11/10/2013  . Benign hypertension with chronic kidney disease, stage IV ([place]) 11/10/2013  . Disorder of magnesium metabolism 10/13/2013  . CAD in  native artery 09/01/2013  . Anxiety state 07/27/2013  . Depressive disorder 07/27/2013  . Gastro-esophageal reflux disease without esophagitis 07/27/2013  . Low back pain 06/03/2013  . Mitral valve disease 06/03/2013  . Pulmonary valve disorder 06/03/2013  . Tricuspid valve disorder 06/03/2013  . 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 Allergic rhinitis 02/07/2013  . 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 Pain in joint 02/07/2013    Current Outpatient Medications:  .  allopurinol (ZYLOPRIM) 100 MG tablet, Take 200 mg by mouth daily. , Disp: , Rfl:  .  amiodarone (PACERONE) 200 MG tablet, Take 0.5 tablets (100 mg total) by mouth daily., Disp: 90 tablet, Rfl: 3 .  aspirin 81 MG chewable tablet, Chew 81 mg by mouth daily., Disp: , Rfl:  .  atorvastatin (LIPITOR) 80 MG tablet, Take 1 tablet (80 mg total) by mouth at bedtime., Disp: 90 tablet, Rfl: 3 .  Calcium Carbonate-Vitamin D3 (CALCIUM 600/VITAMIN D) 600-400 MG-UNIT TABS, Take 1 tablet by mouth 2 (two) times daily. , Disp: , Rfl:  .  carvedilol (COREG) 25 MG tablet, Take 1 tablet (25 mg total) by mouth 2 (two) times daily with a meal., Disp: 180 tablet, Rfl: 3 .  colchicine 0.6 MG tablet, Take 0.6-1.2 mg by mouth See admin instructions., Disp: , Rfl:  .  diclofenac sodium (VOLTAREN) 1 % GEL, Apply 2 g topically 4 (four) times daily., Disp: 100 g, Rfl: 0 .  ferrous sulfate 325 (65 FE) MG tablet, Take 325 mg by mouth 3 (three) times daily. , Disp: , Rfl:  .  hydrALAZINE (APRESOLINE) 50 MG tablet, Take 1.5 tablets (75 mg total) by mouth 3 (three) times daily., Disp: 135 tablet, Rfl:  0 .  isosorbide mononitrate (IMDUR) 60 MG 24 hr tablet, Take 1 tablet (60 mg total) by mouth daily., Disp: 30 tablet, Rfl: 0 .  loratadine (CLARITIN) 10 MG tablet, Take 10 mg by mouth daily., Disp: , Rfl:  .  Magnesium Oxide 420 MG TABS, Take 840 mg by mouth daily., Disp: , Rfl:  .  Multiple Vitamin (MULTIVITAMIN) tablet, Take 1 tablet by mouth daily., Disp: ,  Rfl:  .  omeprazole (PRILOSEC) 40 MG capsule, Take 40 mg by mouth daily., Disp: , Rfl:  .  rOPINIRole (REQUIP) 0.5 MG tablet, Take 0.5 mg by mouth at bedtime. , Disp: , Rfl:  .  sertraline (ZOLOFT) 100 MG tablet, Take 150 mg by mouth at bedtime. , Disp: , Rfl:  .  torsemide (DEMADEX) 20 MG tablet, Take 1 tablet (20 mg total) by mouth 2 (two) times daily., Disp: 120 tablet, Rfl: 3 .  TRULICITY 0.34 VQ/2.5ZD SOPN, Inject 0.75 mg into the skin every Sunday. , Disp: , Rfl:  .  Calcium Carbonate-Vitamin D 600-400 MG-UNIT tablet, Take 1 tablet by mouth in the morning. (Patient not taking: Reported on 08/17/2020), Disp: , Rfl:  .  cyclobenzaprine (FLEXERIL) 5 MG tablet, Take 5 mg by mouth 3 (three) times daily as needed for muscle spasms. (Patient not taking: Reported on 08/17/2020), Disp: , Rfl:  .  empagliflozin (JARDIANCE) 10 MG TABS tablet, Take 1 tablet (10 mg total) by mouth daily before breakfast. (Patient not taking: Reported on 08/17/2020), Disp: 30 tablet, Rfl: 6 .  potassium chloride (KLOR-CON) 10 MEQ tablet, Take 4 tablets (40 mEq total) by mouth daily., Disp: 240 tablet, Rfl: 11 Allergies  Allergen Reactions  . Codeine Itching  . Gabapentin Itching  . Haldol [Haloperidol Lactate] Other (See Comments)    Dizziness, hallucinations  . Levofloxacin     Other reaction(s): Other (See Comments) Projectile vomiting   . Lisinopril Itching and Swelling  . Losartan Itching  . Morphine And Related Nausea And Vomiting  . Penicillins Nausea And Vomiting    "Vomiting, upset stomach, Headache" Has patient had a PCN reaction causing immediate rash, facial/tongue/throat swelling, SOB or lightheadedness with hypotension: No Has patient had a PCN reaction causing severe rash involving mucus membranes or skin necrosis: No Has patient had a PCN reaction that required hospitalization: No Has patient had a PCN reaction occurring within the last 10 years: No If all of the above answers are "NO", then may  proceed with Cephalosporin use.       Social History   Socioeconomic History  . Marital status: Single    Spouse name: Not on file  . Number of children: Not on file  . Years of education: Not on file  . Highest education level: Not on file  Occupational History  . Not on file  Tobacco Use  . Smoking status: Never Smoker  . Smokeless tobacco: Never Used  Vaping Use  . Vaping Use: Never used  Substance and Sexual Activity  . Alcohol use: No  . Drug use: No  . Sexual activity: Yes  Other Topics Concern  . Not on file  Social History Narrative  . Not on file   Social Determinants of Health   Financial Resource Strain: Not on file  Food Insecurity: Not on file  Transportation Needs: Not on file  Physical Activity: Not on file  Stress: Not on file  Social Connections: Not on file  Intimate Partner Violence: Not on file    Physical Exam Cardiovascular:  Rate and Rhythm: Normal rate and regular rhythm.     Pulses: Normal pulses.  Pulmonary:     Effort: Pulmonary effort is normal.     Breath sounds: Normal breath sounds.  Musculoskeletal:        General: Normal range of motion.     Right lower leg: No edema.     Left lower leg: No edema.  Skin:    General: Skin is warm and dry.     Capillary Refill: Capillary refill takes less than 2 seconds.  Neurological:     Mental Status: She is alert and oriented to person, place, and time.  Psychiatric:        Mood and Affect: Mood normal.         Future Appointments  Date Time Provider [place]  08/18/2020  9:30 AM [facility] DEVICE REMOTES [facility]  08/23/2020  9:00 AM [facility] ECHO OP 1 [facility]  09/19/2020  9:30 AM [facility] DEVICE REMOTES [facility]  10/20/2020  9:30 AM [facility] DEVICE REMOTES [facility]  11/21/2020  9:30 AM [facility] DEVICE REMOTES [facility]  12/22/2020  9:30 AM [facility] DEVICE REMOTES [facility]     BP 137/75 (BP Location: Left Arm, Patient Position: Sitting, Cuff Size: Normal)   Pulse 71   Resp 16   Wt 176 lb 3.2 oz (79.9 kg)   SpO2 98%   BMI 31.21 kg/m   Weight yesterday- did not weigh  Last visit weight- 180 lb  Ms Harrigan was seen at home today and reported feeling well. She denied chest pain, SOB, headache, dizziness, orthopnea, fever or cough over the past week. She stated she has been compliant with her medications including Wilder Glade which she was given a 30-day supply of last week. Her weight remains stable. Her medications were verified and her pillbox was refilled. I will follow up next week.    [name], EMT 08/17/20  ACTION: Home visit completed Next visit planned for 2 weeks

## 2020-08-18 ENCOUNTER — Ambulatory Visit (INDEPENDENT_AMBULATORY_CARE_PROVIDER_SITE_OTHER): Payer: Medicare Other

## 2020-08-18 DIAGNOSIS — I429 Cardiomyopathy, unspecified: Secondary | ICD-10-CM | POA: Diagnosis not present

## 2020-08-18 NOTE — Telephone Encounter (Signed)
Did not need this encounter °

## 2020-08-23 ENCOUNTER — Other Ambulatory Visit: Payer: Self-pay

## 2020-08-23 ENCOUNTER — Ambulatory Visit (HOSPITAL_COMMUNITY)
Admission: RE | Admit: 2020-08-23 | Discharge: 2020-08-23 | Disposition: A | Discharge status: Home / Self Care | Payer: Medicare Other | Source: Ambulatory Visit | Attending: Internal Medicine | Admitting: Internal Medicine

## 2020-08-23 DIAGNOSIS — I252 Old myocardial infarction: Secondary | ICD-10-CM | POA: Diagnosis not present

## 2020-08-23 DIAGNOSIS — I313 Pericardial effusion (noninflammatory): Secondary | ICD-10-CM | POA: Diagnosis not present

## 2020-08-23 DIAGNOSIS — I110 Hypertensive heart disease with heart failure: Secondary | ICD-10-CM | POA: Insufficient documentation

## 2020-08-23 DIAGNOSIS — E119 Type 2 diabetes mellitus without complications: Secondary | ICD-10-CM | POA: Insufficient documentation

## 2020-08-23 DIAGNOSIS — I340 Nonrheumatic mitral (valve) insufficiency: Secondary | ICD-10-CM | POA: Insufficient documentation

## 2020-08-23 DIAGNOSIS — I5022 Chronic systolic (congestive) heart failure: Secondary | ICD-10-CM | POA: Diagnosis not present

## 2020-08-23 LAB — ECHOCARDIOGRAM COMPLETE
Area-P 1/2: 3.34 cm2
Calc EF: 40.5 %
S' Lateral: 4.2 cm
Single Plane A2C EF: 40.4 %
Single Plane A4C EF: 40.5 %

## 2020-08-31 ENCOUNTER — Other Ambulatory Visit (HOSPITAL_COMMUNITY): Payer: Self-pay

## 2020-08-31 NOTE — Progress Notes (Signed)
Paramedicine Encounter    Patient ID: Michelle Andrade, female    DOB: 10-25-1960, 59 y.o.   MRN: [id number]   Patient Care Team: Zachery Dauer, MD as PCP - General (Internal Medicine) Constance Haw, MD as PCP - Electrophysiology (Cardiology) Bensimhon, Shaune Pascal, MD as PCP - Cardiology (Cardiology) Jorge Ny, LCSW as Social Worker (Licensed Clinical Social Worker)  Patient Active Problem List   Diagnosis Date Noted  . Abnormal stress test   . Chest pain 01/05/2020  . Secondary hyperparathyroidism of renal origin ([place]) 03/20/2018  . Severe obesity (BMI 35.0-39.9) with comorbidity ([place]) 12/19/2017  . CKD (chronic kidney disease) stage 4, GFR 15-29 ml/min (HCC) 06/07/2017  . Acute on chronic systolic (congestive) heart failure ([place])   . CHF (congestive heart failure) ([place]) 02/09/2017  . Pulmonary vascular congestion 02/09/2017  . Hypertension   . Sleep apnea   . Chondrocalcinosis of both knees 02/08/2017  . Primary osteoarthritis of both knees 02/08/2017  . Type 2 diabetes mellitus ([place]) 11/16/2016  . Borderline personality disorder ([place]) 10/17/2016  . Carpal tunnel syndrome 10/17/2016  . 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 Eczema 07/03/2016  . Atrial tachycardia ([place]) 05/31/2016  . 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 Onychomycosis of toenail 06/08/2015  . Cardiomyopathy ([place]) 06/07/2015  . Irritable bowel syndrome 09/27/2014  . PAD (peripheral artery disease) ([place]) 01/12/2014  . History of DVT (deep vein thrombosis) 01/12/2014  . Acute gout of right knee 12/29/2013  . Iron deficiency anemia 12/29/2013  . Lichenification and lichen simplex chronicus 12/26/2013  . Hyperlipidemia associated with type 2 diabetes mellitus ([place]) 11/10/2013  . Benign hypertension with chronic kidney disease, stage IV ([place]) 11/10/2013  . Disorder of magnesium metabolism 10/13/2013  . CAD in  native artery 09/01/2013  . Anxiety state 07/27/2013  . Depressive disorder 07/27/2013  . Gastro-esophageal reflux disease without esophagitis 07/27/2013  . Low back pain 06/03/2013  . Mitral valve disease 06/03/2013  . Pulmonary valve disorder 06/03/2013  . Tricuspid valve disorder 06/03/2013  . 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 Allergic rhinitis 02/07/2013  . 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 Pain in joint 02/07/2013    Current Outpatient Medications:  .  allopurinol (ZYLOPRIM) 100 MG tablet, Take 200 mg by mouth daily. , Disp: , Rfl:  .  amiodarone (PACERONE) 200 MG tablet, Take 0.5 tablets (100 mg total) by mouth daily., Disp: 90 tablet, Rfl: 3 .  aspirin 81 MG chewable tablet, Chew 81 mg by mouth daily., Disp: , Rfl:  .  atorvastatin (LIPITOR) 80 MG tablet, Take 1 tablet (80 mg total) by mouth at bedtime., Disp: 90 tablet, Rfl: 3 .  Calcium Carbonate-Vitamin D 600-400 MG-UNIT tablet, Take 1 tablet by mouth in the morning. (Patient not taking: Reported on 08/17/2020), Disp: , Rfl:  .  Calcium Carbonate-Vitamin D3 (CALCIUM 600/VITAMIN D) 600-400 MG-UNIT TABS, Take 1 tablet by mouth 2 (two) times daily. , Disp: , Rfl:  .  carvedilol (COREG) 25 MG tablet, Take 1 tablet (25 mg total) by mouth 2 (two) times daily with a meal., Disp: 180 tablet, Rfl: 3 .  colchicine 0.6 MG tablet, Take 0.6-1.2 mg by mouth See admin instructions., Disp: , Rfl:  .  cyclobenzaprine (FLEXERIL) 5 MG tablet, Take 5 mg by mouth 3 (three) times daily as needed for muscle spasms. (Patient not taking: Reported on 08/17/2020), Disp: , Rfl:  .  diclofenac sodium (VOLTAREN) 1 % GEL, Apply 2  g topically 4 (four) times daily., Disp: 100 g, Rfl: 0 .  empagliflozin (JARDIANCE) 10 MG TABS tablet, Take 1 tablet (10 mg total) by mouth daily before breakfast. (Patient not taking: Reported on 08/17/2020), Disp: 30 tablet, Rfl: 6 .  ferrous sulfate 325 (65 FE) MG tablet, Take 325 mg by mouth 3 (three) times daily. ,  Disp: , Rfl:  .  hydrALAZINE (APRESOLINE) 50 MG tablet, Take 1.5 tablets (75 mg total) by mouth 3 (three) times daily., Disp: 135 tablet, Rfl: 0 .  isosorbide mononitrate (IMDUR) 60 MG 24 hr tablet, Take 1 tablet (60 mg total) by mouth daily., Disp: 30 tablet, Rfl: 0 .  loratadine (CLARITIN) 10 MG tablet, Take 10 mg by mouth daily., Disp: , Rfl:  .  Magnesium Oxide 420 MG TABS, Take 840 mg by mouth daily., Disp: , Rfl:  .  Multiple Vitamin (MULTIVITAMIN) tablet, Take 1 tablet by mouth daily., Disp: , Rfl:  .  omeprazole (PRILOSEC) 40 MG capsule, Take 40 mg by mouth daily., Disp: , Rfl:  .  potassium chloride (KLOR-CON) 10 MEQ tablet, Take 4 tablets (40 mEq total) by mouth daily., Disp: 240 tablet, Rfl: 11 .  rOPINIRole (REQUIP) 0.5 MG tablet, Take 0.5 mg by mouth at bedtime. , Disp: , Rfl:  .  sertraline (ZOLOFT) 100 MG tablet, Take 150 mg by mouth at bedtime. , Disp: , Rfl:  .  torsemide (DEMADEX) 20 MG tablet, Take 1 tablet (20 mg total) by mouth 2 (two) times daily., Disp: 120 tablet, Rfl: 3 .  TRULICITY 3.15 VV/6.1YW SOPN, Inject 0.75 mg into the skin every Sunday. , Disp: , Rfl:  Allergies  Allergen Reactions  . Codeine Itching  . Gabapentin Itching  . Haldol [Haloperidol Lactate] Other (See Comments)    Dizziness, hallucinations  . Levofloxacin     Other reaction(s): Other (See Comments) Projectile vomiting   . Lisinopril Itching and Swelling  . Losartan Itching  . Morphine And Related Nausea And Vomiting  . Penicillins Nausea And Vomiting    "Vomiting, upset stomach, Headache" Has patient had a PCN reaction causing immediate rash, facial/tongue/throat swelling, SOB or lightheadedness with hypotension: No Has patient had a PCN reaction causing severe rash involving mucus membranes or skin necrosis: No Has patient had a PCN reaction that required hospitalization: No Has patient had a PCN reaction occurring within the last 10 years: No If all of the above answers are "NO", then may  proceed with Cephalosporin use.       Social History   Socioeconomic History  . Marital status: Single    Spouse name: Not on file  . Number of children: Not on file  . Years of education: Not on file  . Highest education level: Not on file  Occupational History  . Not on file  Tobacco Use  . Smoking status: Never Smoker  . Smokeless tobacco: Never Used  Vaping Use  . Vaping Use: Never used  Substance and Sexual Activity  . Alcohol use: No  . Drug use: No  . Sexual activity: Yes  Other Topics Concern  . Not on file  Social History Narrative  . Not on file   Social Determinants of Health   Financial Resource Strain: Not on file  Food Insecurity: Not on file  Transportation Needs: Not on file  Physical Activity: Not on file  Stress: Not on file  Social Connections: Not on file  Intimate Partner Violence: Not on file    Physical Exam Cardiovascular:  Rate and Rhythm: Normal rate and regular rhythm.     Pulses: Normal pulses.  Pulmonary:     Effort: Pulmonary effort is normal.     Breath sounds: Normal breath sounds.  Musculoskeletal:        General: Normal range of motion.     Right lower leg: No edema.     Left lower leg: No edema.  Skin:    General: Skin is warm and dry.     Capillary Refill: Capillary refill takes less than 2 seconds.  Neurological:     Mental Status: She is alert and oriented to person, place, and time.  Psychiatric:        Mood and Affect: Mood normal.         Future Appointments  Date Time Provider [place]  09/19/2020  9:30 AM [facility] DEVICE REMOTES [facility]  09/20/2020  4:00 PM [facility] DEVICE 1 [facility]  10/20/2020  9:30 AM [facility] DEVICE REMOTES [facility]  11/21/2020  9:30 AM [facility] DEVICE REMOTES [facility]  12/19/2020  4:00 PM Camnitz, Ocie Doyne, MD [facility] None  12/22/2020  9:30 AM [facility] DEVICE REMOTES [facility]    BP 115/65 (BP Location: Left Arm, Patient Position: Sitting, Cuff Size: Normal)   Pulse 66   Resp 16   Wt 175 lb (79.4 kg)   SpO2 97%   BMI 31.00 kg/m   Weight yesterday- 174 lb Last visit weight- 176 lb   Ms Waugh was seen at home today and reported feeling well. She denied chest pain, SOB, headache, dizziness, orthopnea, fever or cough over the past two weeks. She stated she had been compliant with her medications and her weight has been stable. Her medications were verified and her pillbox was refilled. I will follow up next week.   Michelle Andrade, EMT 08/31/20  ACTION: Home visit completed Next visit planned for 1 week

## 2020-09-01 NOTE — Progress Notes (Signed)
Remote ICD transmission.   

## 2020-09-06 ENCOUNTER — Telehealth: Payer: Self-pay | Admitting: Cardiology

## 2020-09-06 NOTE — Telephone Encounter (Signed)
Called patient and reviewed pre procedure instructions. Letter was written and send to her via mychart as well. We discussed the letter, pre procedure, and COVID screening instructions. She has verbalized understanding of instructions and will call with any additional questions.

## 2020-09-06 NOTE — Telephone Encounter (Signed)
New Message:      Pt says she needs instructions and what time she needs to be at the hospital for her procedure on 09-09-20.

## 2020-09-07 ENCOUNTER — Other Ambulatory Visit (HOSPITAL_COMMUNITY): Payer: Self-pay | Admitting: Internal Medicine

## 2020-09-07 ENCOUNTER — Other Ambulatory Visit (HOSPITAL_COMMUNITY)
Admission: RE | Admit: 2020-09-07 | Discharge: 2020-09-07 | Disposition: A | Discharge status: Home / Self Care | Payer: Medicare Other | Source: Ambulatory Visit | Attending: Cardiology | Admitting: Cardiology

## 2020-09-07 DIAGNOSIS — Z20822 Contact with and (suspected) exposure to covid-19: Secondary | ICD-10-CM | POA: Insufficient documentation

## 2020-09-07 DIAGNOSIS — Z01818 Encounter for other preprocedural examination: Secondary | ICD-10-CM | POA: Diagnosis present

## 2020-09-08 LAB — SARS CORONAVIRUS 2 (TAT 6-24 HRS): SARS Coronavirus 2: NEGATIVE

## 2020-09-09 ENCOUNTER — Encounter (HOSPITAL_COMMUNITY)
Admission: RE | Disposition: A | Discharge status: Home / Self Care | Payer: Self-pay | Source: Home / Self Care | Attending: Cardiology

## 2020-09-09 ENCOUNTER — Ambulatory Visit (HOSPITAL_COMMUNITY)
Admission: RE | Admit: 2020-09-09 | Discharge: 2020-09-09 | Disposition: A | Discharge status: Home / Self Care | Payer: Medicare Other | Attending: Cardiology | Admitting: Cardiology

## 2020-09-09 DIAGNOSIS — Z880 Allergy status to penicillin: Secondary | ICD-10-CM | POA: Diagnosis not present

## 2020-09-09 DIAGNOSIS — Z4502 Encounter for adjustment and management of automatic implantable cardiac defibrillator: Secondary | ICD-10-CM | POA: Insufficient documentation

## 2020-09-09 DIAGNOSIS — I4892 Unspecified atrial flutter: Secondary | ICD-10-CM | POA: Insufficient documentation

## 2020-09-09 DIAGNOSIS — Z888 Allergy status to other drugs, medicaments and biological substances status: Secondary | ICD-10-CM | POA: Diagnosis not present

## 2020-09-09 DIAGNOSIS — I429 Cardiomyopathy, unspecified: Secondary | ICD-10-CM | POA: Diagnosis not present

## 2020-09-09 DIAGNOSIS — I5022 Chronic systolic (congestive) heart failure: Secondary | ICD-10-CM | POA: Insufficient documentation

## 2020-09-09 DIAGNOSIS — Z881 Allergy status to other antibiotic agents status: Secondary | ICD-10-CM | POA: Insufficient documentation

## 2020-09-09 DIAGNOSIS — I428 Other cardiomyopathies: Secondary | ICD-10-CM | POA: Insufficient documentation

## 2020-09-09 DIAGNOSIS — Z885 Allergy status to narcotic agent status: Secondary | ICD-10-CM | POA: Diagnosis not present

## 2020-09-09 DIAGNOSIS — I480 Paroxysmal atrial fibrillation: Secondary | ICD-10-CM | POA: Insufficient documentation

## 2020-09-09 HISTORY — PX: BIV ICD GENERATOR CHANGEOUT: EP1194

## 2020-09-09 LAB — GLUCOSE, CAPILLARY: Glucose-Capillary: 122 mg/dL — ABNORMAL HIGH (ref 70–99)

## 2020-09-09 SURGERY — BIV ICD GENERATOR CHANGEOUT

## 2020-09-09 MED ORDER — ACETAMINOPHEN 325 MG PO TABS: 325.0000 mg | ORAL_TABLET | ORAL | Status: DC | PRN

## 2020-09-09 MED ORDER — MIDAZOLAM HCL 5 MG/5ML IJ SOLN
INTRAMUSCULAR | Status: DC | PRN
  Administered 2020-09-09 (×2): 1 mg via INTRAVENOUS

## 2020-09-09 MED ORDER — MIDAZOLAM HCL 5 MG/5ML IJ SOLN
INTRAMUSCULAR | Status: AC
  Filled 2020-09-09: qty 5

## 2020-09-09 MED ORDER — FENTANYL CITRATE (PF) 100 MCG/2ML IJ SOLN
INTRAMUSCULAR | Status: AC
  Filled 2020-09-09: qty 2

## 2020-09-09 MED ORDER — ONDANSETRON HCL 4 MG/2ML IJ SOLN: 4.0000 mg | Freq: Four times a day (QID) | INTRAMUSCULAR | Status: DC | PRN

## 2020-09-09 MED ORDER — SODIUM CHLORIDE 0.9 % IV SOLN: INTRAVENOUS | Status: DC

## 2020-09-09 MED ORDER — LIDOCAINE HCL (PF) 1 % IJ SOLN
INTRAMUSCULAR | Status: DC | PRN
  Administered 2020-09-09: 60 mL

## 2020-09-09 MED ORDER — VANCOMYCIN HCL IN DEXTROSE 1-5 GM/200ML-% IV SOLN
INTRAVENOUS | Status: AC
  Filled 2020-09-09: qty 200

## 2020-09-09 MED ORDER — SODIUM CHLORIDE 0.9 % IV SOLN
INTRAVENOUS | Status: AC
  Filled 2020-09-09: qty 2

## 2020-09-09 MED ORDER — FENTANYL CITRATE (PF) 100 MCG/2ML IJ SOLN
INTRAMUSCULAR | Status: DC | PRN
  Administered 2020-09-09 (×2): 25 ug via INTRAVENOUS

## 2020-09-09 MED ORDER — VANCOMYCIN HCL IN DEXTROSE 1-5 GM/200ML-% IV SOLN
1000.0000 mg | INTRAVENOUS | Status: AC
  Administered 2020-09-09: 1000 mg via INTRAVENOUS

## 2020-09-09 MED ORDER — LIDOCAINE HCL (PF) 1 % IJ SOLN
INTRAMUSCULAR | Status: AC
  Filled 2020-09-09: qty 60

## 2020-09-09 MED ORDER — CHLORHEXIDINE GLUCONATE 4 % EX LIQD: 4.0000 "application " | Freq: Once | CUTANEOUS | Status: DC

## 2020-09-09 MED ORDER — SODIUM CHLORIDE 0.9 % IV SOLN
80.0000 mg | INTRAVENOUS | Status: AC
  Administered 2020-09-09: 80 mg
  Filled 2020-09-09: qty 2

## 2020-09-09 SURGICAL SUPPLY — 4 items
CABLE SURGICAL S-101-97-12 (CABLE) ×2 IMPLANT
ICD GALLANT HFCRTD CDHFA500Q (ICD Generator) ×2 IMPLANT
PAD PRO RADIOLUCENT 2001M-C (PAD) ×2 IMPLANT
TRAY PACEMAKER INSERTION (PACKS) ×2 IMPLANT

## 2020-09-09 NOTE — Discharge Instructions (Signed)
Pacemaker Battery Change, Care After  This sheet gives you information about how to care for yourself after your procedure. Your health care provider may also give you more specific instructions. If you have problems or questions, contact your health care provider. What can I expect after the procedure? After your procedure, it is common to have:  Pain or soreness at the site where the pacemaker was inserted.  Swelling at the site where the pacemaker was inserted. Follow these instructions at home: Incision care   Keep the incision clean and dry. ? Do not take baths, swim, or use a hot tub until your health care provider approves. ? You may shower the day after your procedure, or as directed by your health care provider. ? Pat the area dry with a clean towel. Do not rub the area. This may cause bleeding.  Follow instructions from your health care provider about how to take care of your incision. Make sure you: ? Wash your hands with soap and water before you change your bandage (dressing). If soap and water are not available, use hand sanitizer. ? Change your dressing as told by your health care provider. ? Leave stitches (sutures), skin glue, or adhesive strips in place. These skin closures may need to stay in place for 2 weeks or longer. If adhesive strip edges start to loosen and curl up, you may trim the loose edges. Do not remove adhesive strips completely unless your health care provider tells you to do that.  Check your incision area every day for signs of infection. Check for: ? More redness, swelling, or pain. ? More fluid or blood. ? Warmth. ? Pus or a bad smell. Activity  Do not lift anything that is heavier than 10 lb (4.5 kg) until your health care provider says it is okay to do so.  For the first 2 weeks, or as long as told by your health care provider: ? Avoid lifting your left arm higher than your shoulder. ? Be gentle when you move your arms over your head. It is  okay to raise your arm to comb your hair. ? Avoid strenuous exercise.  Ask your health care provider when it is okay to: ? Resume your normal activities. ? Return to work or school. ? Resume sexual activity. Eating and drinking  Eat a heart-healthy diet. This should include plenty of fresh fruits and vegetables, whole grains, low-fat dairy products, and lean protein like chicken and fish.  Limit alcohol intake to no more than 1 drink a day for non-pregnant women and 2 drinks a day for men. One drink equals 12 oz of beer, 5 oz of wine, or 1 oz of hard liquor.  Check ingredients and nutrition facts on packaged foods and beverages. Avoid the following types of food: ? Food that is high in salt (sodium). ? Food that is high in saturated fat, like full-fat dairy or red meat. ? Food that is high in trans fat, like fried food. ? Food and drinks that are high in sugar. Lifestyle  Do not use any products that contain nicotine or tobacco, such as cigarettes and e-cigarettes. If you need help quitting, ask your health care provider.  Take steps to manage and control your weight.  Get regular exercise. Aim for 150 minutes of moderate-intensity exercise (such as walking or yoga) or 75 minutes of vigorous exercise (such as running or swimming) each week.  Manage other health problems, such as diabetes or high blood pressure. Ask your  health care provider how you can manage these conditions. General instructions  Do not drive for 24 hours after your procedure if you were given a medicine to help you relax (sedative).  Take over-the-counter and prescription medicines only as told by your health care provider.  Avoid putting pressure on the area where the pacemaker was placed.  If you need an MRI after your pacemaker has been placed, be sure to tell the health care provider who orders the MRI that you have a pacemaker.  Avoid close and prolonged exposure to electrical devices that have strong  magnetic fields. These include: ? Cell phones. Avoid keeping them in a pocket near the pacemaker, and try using the ear opposite the pacemaker. ? MP3 players. ? Household appliances, like microwaves. ? Metal detectors. ? Electric generators. ? High-tension wires.  Keep all follow-up visits as directed by your health care provider. This is important. Contact a health care provider if:  You have pain at the incision site that is not relieved by over-the-counter or prescription medicines.  You have any of these around your incision site or coming from it: ? More redness, swelling, or pain. ? Fluid or blood. ? Warmth to the touch. ? Pus or a bad smell.  You have a fever.  You feel brief, occasional palpitations, light-headedness, or any symptoms that you think might be related to your heart. Get help right away if:  You experience chest pain that is different from the pain at the pacemaker site.  You develop a red streak that extends above or below the incision site.  You experience shortness of breath.  You have palpitations or an irregular heartbeat.  You have light-headedness that does not go away quickly.  You faint or have dizzy spells.  Your pulse suddenly drops or increases rapidly and does not return to normal.  You begin to gain weight and your legs and ankles swell. Summary  After your procedure, it is common to have pain, soreness, and some swelling where the pacemaker was inserted.  Make sure to keep your incision clean and dry. Follow instructions from your health care provider about how to take care of your incision.  Check your incision every day for signs of infection, such as more pain or swelling, pus or a bad smell, warmth, or leaking fluid and blood.  Avoid strenuous exercise and lifting your left arm higher than your shoulder for 2 weeks, or as long as told by your health care provider. This information is not intended to replace advice given to you by  your health care provider. Make sure you discuss any questions you have with your health care provider. Document Revised: 08/02/2017 Document Reviewed: 07/12/2016 Elsevier Patient Education  2020 [employer].

## 2020-09-09 NOTE — H&P (Signed)
Electrophysiology Office Note   Date:  09/09/2020   ID:  Michelle Andrade, DOB 1961/06/17, MRN [id number]  PCP:  Zachery Dauer, MD  Cardiologist:  [place] Primary Electrophysiologist:  Dae Antonucci Meredith Leeds, MD    Chief Complaint: CHF   History of Present Illness: Michelle Andrade is a 60 y.o. female who is being seen today for the evaluation of CHF at the request of No ref. provider found. Presenting today for electrophysiology evaluation.  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  She is status post [employer] CRT-D.  Today, denies symptoms of palpitations, chest pain, shortness of breath, orthopnea, PND, lower extremity edema, claudication, dizziness, presyncope, syncope, bleeding, or neurologic sequela. The patient is tolerating medications without difficulties. Plan for generator chagne today.   Past Medical History:  Diagnosis Date  . Arthritis 02/08/2017  . 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 CAD (coronary artery disease) 09/01/2013  . CHF (congestive heart failure) ([place]) 06/07/2015  . CKD (chronic kidney disease), stage III ([place]) 12/09/2013  . Depression 07/27/2013  . Diabetes mellitus without complication ([place]) 95/63/8756  . GERD (gastroesophageal reflux disease) 07/27/2013  . Gout 12/29/2013  . Heart valve disorder 06/03/2013  . High cholesterol 11/10/2013  . Hypertension 11/10/2013  . IBS (irritable bowel syndrome) 09/27/2014  . Myocardial infarct ([place])   . Sleep apnea 10/13/2013  . Vascular disorder 01/12/2014   Past Surgical History:  Procedure Laterality Date  . ABDOMINAL HYSTERECTOMY    . CARDIAC DEFIBRILLATOR PLACEMENT    . CARPAL TUNNEL RELEASE    . HEEL SPUR EXCISION    . INTRAVASCULAR PRESSURE WIRE/FFR STUDY N/A 01/08/2020   Procedure: INTRAVASCULAR  PRESSURE WIRE/FFR STUDY;  Surgeon: Jettie Booze, MD;  Location: [facility];  Service: Cardiovascular;  Laterality: N/A;  . KNEE SURGERY    . LEFT HEART CATH AND CORONARY ANGIOGRAPHY N/A 01/08/2020   Procedure: LEFT HEART CATH AND CORONARY ANGIOGRAPHY;  Surgeon: Jolaine Artist, MD;  Location: [facility];  Service: Cardiovascular;  Laterality: N/A;  . PACEMAKER GENERATOR CHANGE    . RIGHT HEART CATH N/A 02/13/2017   Procedure: Right Heart Cath;  Surgeon: Jolaine Artist, MD;  Location: [facility];  Service: Cardiovascular;  Laterality: N/A;  . SHOULDER SURGERY       Current Facility-Administered Medications  Medication Dose Route Frequency Provider Last Rate Last Admin  . 0.9 %  sodium chloride infusion   Intravenous Continuous Anvika Gashi Hassell Done, MD      . chlorhexidine (HIBICLENS) 4 % liquid 4 application  4 application Topical Once Wah Sabic Hassell Done, MD      . gentamicin (GARAMYCIN) 80 mg in sodium chloride 0.9 % 500 mL irrigation  80 mg Irrigation On Call Antwain Caliendo Hassell Done, MD      . vancomycin (VANCOCIN) IVPB 1000 mg/200 mL premix  1,000 mg Intravenous On Call Orma Cheetham, Ocie Doyne, MD        Allergies:   Codeine, Gabapentin, Haldol [haloperidol lactate], Levofloxacin, Lisinopril, Losartan, Morphine and related, and Penicillins   Social History:  The patient  reports that she has never smoked. She has never used smokeless tobacco. She reports that she does not drink alcohol and does not use drugs.   Family History:  The patient's family history includes Hypertension in her mother.    ROS:  Please see the history  of present illness.   Otherwise, review of systems is positive for none.   All other systems are reviewed and negative.   PHYSICAL EXAM: VS:  BP (!) 157/81   Pulse 71   Temp 98.7 F (37.1 C) (Oral)   Resp 19   Ht 5\' 3"  (1.6 m)   Wt 79.8 kg   SpO2 100%   BMI 31.18 kg/m  , BMI Body mass index is 31.18 kg/m. GEN: Well  nourished, well developed, in no acute distress  HEENT: normal  Neck: no JVD, carotid bruits, or masses Cardiac: RRR; no murmurs, rubs, or gallops,no edema  Respiratory:  clear to auscultation bilaterally, normal work of breathing GI: soft, nontender, nondistended, + BS MS: no deformity or atrophy  Skin: warm and dry, device site well healed Neuro:  Strength and sensation are intact Psych: euthymic mood, full affect  Personal review of the device interrogation today. Results in [place]: 01/05/2020: B Natriuretic Peptide 113.7 06/27/2020: ALT 27; TSH 3.090 08/11/2020: BUN 17; Creatinine, Ser 1.58; Potassium 3.9; Sodium 139 08/15/2020: Hemoglobin 12.5; Platelets 207    Lipid Panel     Component Value Date/Time   CHOL 145 03/16/2020 1008   TRIG 71 03/16/2020 1008   HDL 59 03/16/2020 1008   CHOLHDL 2.5 03/16/2020 1008   VLDL 14 03/16/2020 1008   LDLCALC 72 03/16/2020 1008     Wt Readings from Last 3 Encounters:  09/09/20 79.8 kg  08/31/20 79.4 kg  08/17/20 79.9 kg      Other studies Reviewed: Additional studies/ records that were reviewed today include: TTE 11/05/18  Review of the above records today demonstrates:  1. The left ventricle has moderate-severely reduced systolic function,  with an ejection fraction of 30-35%. The cavity size was normal. Left  ventricular diastolic Doppler parameters are consistent with impaired  relaxation Elevated left ventricular  end-diastolic pressure Left ventricular diffuse hypokinesis.  2. The right ventricle has normal systolic function. The cavity was  normal. There is no increase in right ventricular wall thickness. Right  ventricular systolic pressure normal with an estimated pressure of 26.7  mmHg.  3. Left atrial size was severely dilated.  4. The mitral valve is normal in structure. Mild thickening of the mitral  valve leaflet. Mild calcification of the anterior mitral valve leaflet.  5. The tricuspid valve  is normal in structure.  6. The aortic valve is tricuspid Mild sclerosis of the aortic valve.  7. The pulmonic valve was normal in structure.    ASSESSMENT AND PLAN:  1.  Chronic systolic heart failure due to nonischemic cardiomyopathy:  ICD Criteria  Current LVEF:25-30%. Within 12 months prior to implant: Yes   Heart failure history: Yes, Class II  Cardiomyopathy history: Yes, Non-Ischemic Cardiomyopathy.  Atrial Fibrillation/Atrial Flutter: Yes, Paroxysmal.  Ventricular tachycardia history: No.  Cardiac arrest history: No.  History of syndromes with risk of sudden death: No.  Previous ICD: Yes, Reason for ICD:  Primary prevention.  Current ICD indication: Primary  PPM indication: No.  Class I or II Bradycardia indication present: No  Beta Blocker therapy for 3 or more months: Yes, prescribed.   Ace Inhibitor/ARB therapy for 3 or more months: Yes, prescribed.    I have seen Michelle Andrade is a 60 y.o. femalepre-procedural and has been referred by [place] for consideration of ICD implant for primary prevention of sudden death.  The patient's chart has been reviewed and they meet criteria for ICD implant.  I have had  a thorough discussion with the patient reviewing options.  The patient and their family (if available) have had opportunities to ask questions and have them answered. The patient and I have decided together through the [place] Support Tool to implant gen change ICD at this time.  Risks, benefits, alternatives to ICD implantation were discussed in detail with the patient today. The patient  understands that the risks include but are not limited to bleeding, infection, pneumothorax, perforation, tamponade, vascular damage, renal failure, MI, stroke, death, inappropriate shocks, and lead dislodgement and  wishes to proceed.

## 2020-09-12 ENCOUNTER — Telehealth: Payer: Self-pay

## 2020-09-12 ENCOUNTER — Encounter (HOSPITAL_COMMUNITY): Payer: Self-pay | Admitting: Cardiology

## 2020-09-12 NOTE — Telephone Encounter (Signed)
Patient called in to let us know her wound site is fine and she just had a little bit of blood day after the procedure and it has stopped now and she said she was told to call and let us know. Patient denies any chest pain and feels fine

## 2020-09-19 ENCOUNTER — Telehealth: Payer: Self-pay

## 2020-09-20 ENCOUNTER — Ambulatory Visit: Payer: Medicare Other

## 2020-09-21 ENCOUNTER — Ambulatory Visit (INDEPENDENT_AMBULATORY_CARE_PROVIDER_SITE_OTHER): Payer: Medicare Other | Admitting: Emergency Medicine

## 2020-09-21 ENCOUNTER — Other Ambulatory Visit: Payer: Self-pay

## 2020-09-21 DIAGNOSIS — Z95810 Presence of automatic (implantable) cardiac defibrillator: Secondary | ICD-10-CM | POA: Diagnosis not present

## 2020-09-21 DIAGNOSIS — I5023 Acute on chronic systolic (congestive) heart failure: Secondary | ICD-10-CM | POA: Diagnosis not present

## 2020-09-21 LAB — CUP PACEART INCLINIC DEVICE CHECK
Battery Remaining Longevity: 85 mo
Brady Statistic RA Percent Paced: 0.48 %
Brady Statistic RV Percent Paced: 99.97 %
Date Time Interrogation Session: 20220119115500
HighPow Impedance: 63 Ohm
Implantable Lead Implant Date: 20150915
Implantable Lead Implant Date: 20150915
Implantable Lead Implant Date: 20150915
Implantable Lead Location: 753858
Implantable Lead Location: 753859
Implantable Lead Location: 753860
Implantable Pulse Generator Implant Date: 20220107
Lead Channel Impedance Value: 337.5 Ohm
Lead Channel Impedance Value: 425 Ohm
Lead Channel Impedance Value: 425 Ohm
Lead Channel Pacing Threshold Amplitude: 0.75 V
Lead Channel Pacing Threshold Amplitude: 1.125 V
Lead Channel Pacing Threshold Amplitude: 1.5 V
Lead Channel Pacing Threshold Pulse Width: 0.5 ms
Lead Channel Pacing Threshold Pulse Width: 0.5 ms
Lead Channel Pacing Threshold Pulse Width: 0.5 ms
Lead Channel Sensing Intrinsic Amplitude: 12 mV
Lead Channel Sensing Intrinsic Amplitude: 3.8 mV
Lead Channel Setting Pacing Amplitude: 1.75 V
Lead Channel Setting Pacing Amplitude: 2 V
Lead Channel Setting Pacing Amplitude: 2.125
Lead Channel Setting Pacing Pulse Width: 0.5 ms
Lead Channel Setting Pacing Pulse Width: 0.5 ms
Lead Channel Setting Sensing Sensitivity: 0.5 mV
Pulse Gen Serial Number: 111036830

## 2020-09-21 NOTE — Progress Notes (Signed)
Wound check appointment. Steri-strips removed. Wound without redness or edema. Incision edges approximated, wound well healed. Normal device function. Thresholds, sensing, and impedances consistent with implant measurements. Device programmed at chronic settings due to mature leads. Histogram distribution appropriate for patient and level of activity. No mode switches or ventricular arrhythmias noted. 2 alerts for RA and RV lead noise that occurred at time of gen change. Patient educated about wound care, arm mobility, lifting restrictions, shock plan. ROV with Dr Curt Bears 12/08/20. Enrolled in remote follow-up and nextremote 12/08/20.

## 2020-09-29 ENCOUNTER — Telehealth (HOSPITAL_COMMUNITY): Payer: Self-pay | Admitting: *Deleted

## 2020-09-29 MED ORDER — TORSEMIDE 20 MG PO TABS: 20.0000 mg | ORAL_TABLET | Freq: Two times a day (BID) | ORAL | 3 refills | Status: DC

## 2020-09-29 NOTE — Telephone Encounter (Signed)
Zack called to report pt's med list states pt is on Furosemide 40 mg BID but he is unsure of where this came from as is pt. Pt has been on Torsemide 20 mg BID for years and there are no notes anywhere in chart discussing change. Advised pt should continue her Torsemide 20 mg BID as she has been doing, med list updated

## 2020-09-30 ENCOUNTER — Other Ambulatory Visit (HOSPITAL_COMMUNITY): Payer: Self-pay

## 2020-09-30 MED ORDER — EMPAGLIFLOZIN 10 MG PO TABS: 10.0000 mg | ORAL_TABLET | Freq: Every day | ORAL | 6 refills | Status: DC

## 2020-10-04 ENCOUNTER — Other Ambulatory Visit (HOSPITAL_COMMUNITY): Payer: Self-pay

## 2020-10-04 MED ORDER — EMPAGLIFLOZIN 10 MG PO TABS: 10.0000 mg | ORAL_TABLET | Freq: Every day | ORAL | 3 refills | Status: DC

## 2020-10-05 ENCOUNTER — Other Ambulatory Visit (HOSPITAL_COMMUNITY): Payer: Self-pay | Admitting: *Deleted

## 2020-10-05 MED ORDER — EMPAGLIFLOZIN 10 MG PO TABS: 10.0000 mg | ORAL_TABLET | Freq: Every day | ORAL | 3 refills | Status: DC

## 2020-10-11 ENCOUNTER — Other Ambulatory Visit (HOSPITAL_COMMUNITY): Payer: Self-pay

## 2020-10-11 MED ORDER — EMPAGLIFLOZIN 10 MG PO TABS
10.0000 mg | ORAL_TABLET | Freq: Every day | ORAL | 3 refills | Status: AC
Start: 2020-10-11 — End: ?

## 2020-10-19 ENCOUNTER — Telehealth (HOSPITAL_COMMUNITY): Payer: Self-pay | Admitting: *Deleted

## 2020-10-19 NOTE — Telephone Encounter (Signed)
Zack with paramedicine called stating pts nephrologist wanted to discuss a possible med change due to uncontrolled diabetes. They asked that her office note be forwarded to [place]. (see below *full note in epic*)   AKI -likely related to initiation of Jardiance by cardiologist in December. Creatinine at the time was stable at 1.6. Now significantly worse at 2.3. Could be prerenal AKI in the setting of torsemide, fluid restriction and diuretic effect of SGLT2. Her weight interestingly stable. Has chronic dizziness. -Recheck BMP today -If creatinine is still elevated then we will advise her to stop Jardiance and let her cardiologist know in case they want to instead change her torsemide while continuing Jardiance  CKD stage 3/4 multifactorial 2/2 DM, HTN, multiple contrast exposure. Not on RAAS blocker due to h/o angioedema. Recent baseline creatinine 1.3-1.6. Continues off metolazone and spironolactone -Blood pressures well controlled -Electrolytes acceptable -Diabetes uncontrolled with A1c of 7.6. Due to see Dr. Meredith Pel again in March. Ideally Jardiance would help her diabetes as well but need to stop as noted above.  Routed to [facility] for advice

## 2020-10-20 NOTE — Telephone Encounter (Signed)
Jardiance bumps creatinine in the short term then protects kidney. Lets repeat BMET and see where we are at.

## 2020-10-20 NOTE — Telephone Encounter (Signed)
Lab appt scheduled.

## 2020-10-21 ENCOUNTER — Other Ambulatory Visit: Payer: Self-pay

## 2020-10-21 ENCOUNTER — Ambulatory Visit (HOSPITAL_COMMUNITY)
Admission: RE | Admit: 2020-10-21 | Discharge: 2020-10-21 | Disposition: A | Discharge status: Home / Self Care | Payer: Medicare Other | Source: Ambulatory Visit | Attending: Cardiology | Admitting: Cardiology

## 2020-10-21 DIAGNOSIS — R7989 Other specified abnormal findings of blood chemistry: Secondary | ICD-10-CM | POA: Diagnosis present

## 2020-10-21 DIAGNOSIS — I5022 Chronic systolic (congestive) heart failure: Secondary | ICD-10-CM | POA: Diagnosis present

## 2020-10-21 LAB — HEPATIC FUNCTION PANEL
ALT: 27 U/L (ref 0–44)
AST: 34 U/L (ref 15–41)
Albumin: 3.2 g/dL — ABNORMAL LOW (ref 3.5–5.0)
Alkaline Phosphatase: 122 U/L (ref 38–126)
Bilirubin, Direct: <0.1 mg/dL (ref 0.0–0.2)
Total Bilirubin: 0.5 mg/dL (ref 0.3–1.2)
Total Protein: 6.6 g/dL (ref 6.5–8.1)

## 2020-11-02 ENCOUNTER — Ambulatory Visit (HOSPITAL_COMMUNITY)
Admission: RE | Admit: 2020-11-02 | Discharge: 2020-11-02 | Disposition: A | Discharge status: Home / Self Care | Payer: Medicare Other | Source: Ambulatory Visit | Attending: Internal Medicine | Admitting: Internal Medicine

## 2020-11-02 ENCOUNTER — Other Ambulatory Visit: Payer: Self-pay

## 2020-11-02 ENCOUNTER — Other Ambulatory Visit (HOSPITAL_COMMUNITY): Payer: Self-pay | Admitting: Internal Medicine

## 2020-11-02 DIAGNOSIS — I5022 Chronic systolic (congestive) heart failure: Secondary | ICD-10-CM | POA: Diagnosis not present

## 2020-11-02 LAB — BASIC METABOLIC PANEL
Anion gap: 10 (ref 5–15)
BUN: 22 mg/dL (ref 8–23)
CO2: 26 mmol/L (ref 22–32)
Calcium: 9.3 mg/dL (ref 8.9–10.3)
Chloride: 101 mmol/L (ref 98–111)
Creatinine, Ser: 1.94 mg/dL — ABNORMAL HIGH (ref 0.44–1.00)
GFR, Estimated: 29 mL/min — ABNORMAL LOW (ref >60)
Glucose, Bld: 138 mg/dL — ABNORMAL HIGH (ref 70–99)
Potassium: 3.8 mmol/L (ref 3.5–5.1)
Sodium: 137 mmol/L (ref 135–145)

## 2020-11-02 NOTE — Progress Notes (Signed)
m °

## 2020-11-03 ENCOUNTER — Other Ambulatory Visit (HOSPITAL_COMMUNITY): Payer: Self-pay | Admitting: *Deleted

## 2020-11-08 ENCOUNTER — Other Ambulatory Visit (HOSPITAL_COMMUNITY): Payer: Self-pay | Admitting: *Deleted

## 2020-11-08 DIAGNOSIS — I5022 Chronic systolic (congestive) heart failure: Secondary | ICD-10-CM

## 2020-11-18 ENCOUNTER — Other Ambulatory Visit: Payer: Self-pay

## 2020-11-18 ENCOUNTER — Ambulatory Visit (HOSPITAL_COMMUNITY)
Admission: RE | Admit: 2020-11-18 | Discharge: 2020-11-18 | Disposition: A | Discharge status: Home / Self Care | Payer: Medicare Other | Source: Ambulatory Visit | Attending: Cardiology | Admitting: Cardiology

## 2020-11-18 DIAGNOSIS — I5022 Chronic systolic (congestive) heart failure: Secondary | ICD-10-CM | POA: Diagnosis present

## 2020-11-18 LAB — BASIC METABOLIC PANEL
Anion gap: 7 (ref 5–15)
BUN: 20 mg/dL (ref 8–23)
CO2: 28 mmol/L (ref 22–32)
Calcium: 8.6 mg/dL — ABNORMAL LOW (ref 8.9–10.3)
Chloride: 101 mmol/L (ref 98–111)
Creatinine, Ser: 1.81 mg/dL — ABNORMAL HIGH (ref 0.44–1.00)
GFR, Estimated: 31 mL/min — ABNORMAL LOW (ref >60)
Glucose, Bld: 167 mg/dL — ABNORMAL HIGH (ref 70–99)
Potassium: 3.8 mmol/L (ref 3.5–5.1)
Sodium: 136 mmol/L (ref 135–145)

## 2020-11-25 ENCOUNTER — Telehealth (HOSPITAL_COMMUNITY): Payer: Self-pay | Admitting: Licensed Clinical Social Worker

## 2020-11-25 NOTE — Telephone Encounter (Signed)
CSW called pt to discuss new womens' "Heart Strong" support group. Pt is agreeable to being added to the mailing/reminder list for support group.  CSW will continue to follow and assist as needed  Jorge Ny, [facility] Desk#: [phone] Cell#: [phone]

## 2020-11-29 ENCOUNTER — Telehealth: Payer: Self-pay

## 2020-11-29 NOTE — Telephone Encounter (Signed)
The pt states she is not paired to her phone. I conference [employer] to get additional help with her monitor.  The patient is now paired to her device.

## 2020-12-09 ENCOUNTER — Ambulatory Visit (INDEPENDENT_AMBULATORY_CARE_PROVIDER_SITE_OTHER): Payer: Medicare Other

## 2020-12-09 DIAGNOSIS — I429 Cardiomyopathy, unspecified: Secondary | ICD-10-CM | POA: Diagnosis not present

## 2020-12-09 LAB — CUP PACEART REMOTE DEVICE CHECK
Battery Remaining Longevity: 88 mo
Battery Remaining Percentage: 95 %
Battery Voltage: 3.02 V
Brady Statistic AP VP Percent: 1 %
Brady Statistic AP VS Percent: 1 %
Brady Statistic AS VP Percent: 99 %
Brady Statistic AS VS Percent: 1 %
Brady Statistic RA Percent Paced: 1 %
Date Time Interrogation Session: 20220408023357
HighPow Impedance: 63 Ohm
Implantable Lead Implant Date: 20150915
Implantable Lead Implant Date: 20150915
Implantable Lead Implant Date: 20150915
Implantable Lead Location: 753858
Implantable Lead Location: 753859
Implantable Lead Location: 753860
Implantable Pulse Generator Implant Date: 20220107
Lead Channel Impedance Value: 330 Ohm
Lead Channel Impedance Value: 400 Ohm
Lead Channel Impedance Value: 410 Ohm
Lead Channel Pacing Threshold Amplitude: 0.625 V
Lead Channel Pacing Threshold Amplitude: 1.25 V
Lead Channel Pacing Threshold Amplitude: 1.5 V
Lead Channel Pacing Threshold Pulse Width: 0.5 ms
Lead Channel Pacing Threshold Pulse Width: 0.5 ms
Lead Channel Pacing Threshold Pulse Width: 0.5 ms
Lead Channel Sensing Intrinsic Amplitude: 12 mV
Lead Channel Sensing Intrinsic Amplitude: 3.3 mV
Lead Channel Setting Pacing Amplitude: 1.625
Lead Channel Setting Pacing Amplitude: 2 V
Lead Channel Setting Pacing Amplitude: 2.25 V
Lead Channel Setting Pacing Pulse Width: 0.5 ms
Lead Channel Setting Pacing Pulse Width: 0.5 ms
Lead Channel Setting Sensing Sensitivity: 0.5 mV
Pulse Gen Serial Number: 111036830

## 2020-12-19 ENCOUNTER — Encounter: Payer: Self-pay | Admitting: Cardiology

## 2020-12-19 ENCOUNTER — Ambulatory Visit (INDEPENDENT_AMBULATORY_CARE_PROVIDER_SITE_OTHER): Payer: Medicare Other | Admitting: Cardiology

## 2020-12-19 ENCOUNTER — Other Ambulatory Visit: Payer: Self-pay

## 2020-12-19 VITALS — BP 104/60 | HR 69 | Ht 63.0 in | Wt 188.0 lb

## 2020-12-19 DIAGNOSIS — I428 Other cardiomyopathies: Secondary | ICD-10-CM | POA: Diagnosis not present

## 2020-12-19 NOTE — Progress Notes (Signed)
Electrophysiology Office Note   Date:  12/19/2020   ID:  Michelle Andrade, DOB [birth year], MRN [id number]  PCP:  Michelle Dauer, MD  Cardiologist:  Michelle Andrade Primary Electrophysiologist:  Michelle Soots Meredith Leeds, MD    Chief Complaint: CHF   History of Present Illness: Michelle Andrade is a 60 y.o. female who is being seen today for the evaluation of CHF at the request of Tapp, Dannielle Burn, MD. Presenting today for electrophysiology evaluation.  She has a history significant for hypertension, chronic systolic heart failure secondary to nonischemic cardiomyopathy, atrial tachycardia, CKD stage III, hyperlipidemia, diabetes, OSA on CPAP, and anemia.  She is status post [place] CRT-D.  Today, denies symptoms of palpitations, chest pain, shortness of breath, orthopnea, PND, lower extremity edema, claudication, dizziness, presyncope, syncope, bleeding, or neurologic sequela. The patient is tolerating medications without difficulties.  Since her generator change she has done well.  She has no chest pain or shortness of breath.  She is able do all her daily activities without restriction.  Past Medical History:  Diagnosis Date  . Arthritis 02/08/2017  . 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 CAD (coronary artery disease) 09/01/2013  . CHF (congestive heart failure) ([place]) 06/07/2015  . CKD (chronic kidney disease), stage III ([place]) 12/09/2013  . Depression 07/27/2013  . Diabetes mellitus without complication ([place]) [id number]  . GERD (gastroesophageal reflux disease) 07/27/2013  . Gout 12/29/2013  . Heart valve disorder 06/03/2013  . High cholesterol 11/10/2013  . Hypertension 11/10/2013  . IBS (irritable bowel syndrome) 09/27/2014  . Myocardial infarct ([place])   . Sleep apnea 10/13/2013  . Vascular disorder 01/12/2014   Past Surgical History:  Procedure Laterality Date  . ABDOMINAL HYSTERECTOMY    . BIV ICD GENERATOR CHANGEOUT N/A 09/09/2020    Procedure: BIV ICD GENERATOR CHANGEOUT;  Surgeon: Michelle Haw, MD;  Location: [facility];  Service: Cardiovascular;  Laterality: N/A;  . CARDIAC DEFIBRILLATOR PLACEMENT    . CARPAL TUNNEL RELEASE    . HEEL SPUR EXCISION    . INTRAVASCULAR PRESSURE WIRE/FFR STUDY N/A 01/08/2020   Procedure: INTRAVASCULAR PRESSURE WIRE/FFR STUDY;  Surgeon: Michelle Booze, MD;  Location: [facility];  Service: Cardiovascular;  Laterality: N/A;  . KNEE SURGERY    . LEFT HEART CATH AND CORONARY ANGIOGRAPHY N/A 01/08/2020   Procedure: LEFT HEART CATH AND CORONARY ANGIOGRAPHY;  Surgeon: Michelle Artist, MD;  Location: [facility];  Service: Cardiovascular;  Laterality: N/A;  . PACEMAKER GENERATOR CHANGE    . RIGHT HEART CATH N/A 02/13/2017   Procedure: Right Heart Cath;  Surgeon: Michelle Artist, MD;  Location: [facility];  Service: Cardiovascular;  Laterality: N/A;  . SHOULDER SURGERY       Current Outpatient Medications  Medication Sig Dispense Refill  . allopurinol (ZYLOPRIM) 100 MG tablet Take 200 mg by mouth daily.     [name] amiodarone (PACERONE) 200 MG tablet Take 0.5 tablets (100 mg total) by mouth daily. 90 tablet 3  . aspirin 81 MG chewable tablet Chew 81 mg by mouth daily.    [name] atorvastatin (LIPITOR) 80 MG tablet Take 1 tablet (80 mg total) by mouth at bedtime. 90 tablet 3  . Calcium Carbonate-Vitamin D 600-400 MG-UNIT tablet Take 1 tablet by mouth 2 (two) times daily.    . carvedilol (COREG) 25 MG tablet Take 1 tablet (25 mg total) by mouth 2 (two) times daily with a meal. 180 tablet 3  .  colchicine 0.6 MG tablet Take 0.6 mg by mouth 2 (two) times daily as needed (Gout).    . cyclobenzaprine (FLEXERIL) 5 MG tablet Take 5 mg by mouth 3 (three) times daily as needed for muscle spasms.    . diclofenac sodium (VOLTAREN) 1 % GEL Apply 2 g topically 4 (four) times daily. 100 g 0  . Dulaglutide 1.5 MG/0.5ML SOPN Inject 1.5 mg into the skin every Sunday.    .  empagliflozin (JARDIANCE) 10 MG TABS tablet Take 1 tablet (10 mg total) by mouth daily before breakfast. 90 tablet 3  . ferrous sulfate 325 (65 FE) MG tablet Take 325 mg by mouth 3 (three) times daily.     . hydrALAZINE (APRESOLINE) 50 MG tablet Take 1.5 tablets (75 mg total) by mouth 3 (three) times daily. (Patient taking differently: Take 75 mg by mouth every 8 (eight) hours.) 135 tablet 0  . isosorbide mononitrate (IMDUR) 60 MG 24 hr tablet Take 1 tablet (60 mg total) by mouth daily. 30 tablet 0  . loratadine (CLARITIN) 10 MG tablet Take 10 mg by mouth daily.    . Magnesium Oxide 420 MG TABS Take 840 mg by mouth daily.    . Multiple Vitamin (MULTIVITAMIN) tablet Take 1 tablet by mouth daily.    [name] omeprazole (PRILOSEC) 40 MG capsule Take 40 mg by mouth daily.    . potassium chloride (KLOR-CON) 10 MEQ tablet Take 4 tablets (40 mEq total) by mouth daily. (Patient taking differently: Take 40 mEq by mouth 2 (two) times daily.) 240 tablet 11  . pregabalin (LYRICA) 25 MG capsule Take 1 capsule (25 mg total) by mouth 2 (two) times daily.    [name] rOPINIRole (REQUIP) 0.5 MG tablet Take 0.5 mg by mouth at bedtime.     . sertraline (ZOLOFT) 100 MG tablet Take 150 mg by mouth at bedtime.     . torsemide (DEMADEX) 20 MG tablet Take 1 tablet (20 mg total) by mouth 2 (two) times daily. 180 tablet 3   No current facility-administered medications for this visit.    Allergies:   Codeine, Gabapentin, Haldol [haloperidol lactate], Levofloxacin, Lisinopril, Losartan, Morphine and related, and Penicillins   Social History:  The patient  reports that she has never smoked. She has never used smokeless tobacco. She reports that she does not drink alcohol and does not use drugs.   Family History:  The patient's family history includes Hypertension in her mother.   ROS:  Please see the history of present illness.   Otherwise, review of systems is positive for none.   All other systems are reviewed and negative.    PHYSICAL EXAM: VS:  BP 104/60   Pulse 69   Ht '5\' 3"'$  (1.6 m)   Wt 188 lb (85.3 kg)   SpO2 96%   BMI 33.30 kg/m  , BMI Body mass index is 33.3 kg/m. GEN: Well nourished, well developed, in no acute distress  HEENT: normal  Neck: no JVD, carotid bruits, or masses Cardiac: RRR; no murmurs, rubs, or gallops,no edema  Respiratory:  clear to auscultation bilaterally, normal work of breathing GI: soft, nontender, nondistended, + BS MS: no deformity or atrophy  Skin: warm and dry, device site well healed Neuro:  Strength and sensation are intact Psych: euthymic mood, full affect  EKG:  EKG is ordered today. Personal review of the ekg ordered shows atrial sensed, ventricular paced  Personal review of the device interrogation today. Results in [place]: 01/05/2020: B  Natriuretic Peptide 113.7 06/27/2020: TSH 3.090 08/15/2020: Hemoglobin 12.5; Platelets 207 10/21/2020: ALT 27 11/18/2020: BUN 20; Creatinine, Ser 1.81; Potassium 3.8; Sodium 136    Lipid Panel     Component Value Date/Time   CHOL 145 03/16/2020 1008   TRIG 71 03/16/2020 1008   HDL 59 03/16/2020 1008   CHOLHDL 2.5 03/16/2020 1008   VLDL 14 03/16/2020 1008   LDLCALC 72 03/16/2020 1008     Wt Readings from Last 3 Encounters:  12/19/20 188 lb (85.3 kg)  09/09/20 176 lb (79.8 kg)  08/31/20 175 lb (79.4 kg)      Other studies Reviewed: Additional studies/ records that were reviewed today include: TTE 11/05/18  Review of the above records today demonstrates:  1. The left ventricle has moderate-severely reduced systolic function,  with an ejection fraction of 30-35%. The cavity size was normal. Left  ventricular diastolic Doppler parameters are consistent with impaired  relaxation Elevated left ventricular  end-diastolic pressure Left ventricular diffuse hypokinesis.  2. The right ventricle has normal systolic function. The cavity was  normal. There is no increase in right ventricular wall  thickness. Right  ventricular systolic pressure normal with an estimated pressure of 26.7  mmHg.  3. Left atrial size was severely dilated.  4. The mitral valve is normal in structure. Mild thickening of the mitral  valve leaflet. Mild calcification of the anterior mitral valve leaflet.  5. The tricuspid valve is normal in structure.  6. The aortic valve is tricuspid Mild sclerosis of the aortic valve.  7. The pulmonic valve was normal in structure.    ASSESSMENT AND PLAN:  1.  Chronic systolic heart failure due to nonischemic cardiomyopathy: Currently on optimal medical therapy with Coreg, hydralazine, isosorbide, Aldactone.  Also takes metolazone and torsemide.  Is status post [facility] CRT-D.  Device functioning appropriately.  No changes at this time.  2.  Hypertension: Currently well controlled  3.  Atrial tachycardia: Currently on amiodarone.  High risk medication monitoring.  Remains in sinus rhythm.  Current medicines are reviewed at length with the patient today.   The patient does not have concerns regarding her medicines.  The following changes were made today:   Labs/ tests ordered today include:  Orders Placed This Encounter  Procedures  . EKG 12-Lead     Disposition:   FU with Irys Nigh 12 months  Signed, Janeece Blok Meredith Leeds, MD  12/19/2020 4:17 PM     [place] [address] [phone] (office) [phone] (fax)

## 2020-12-19 NOTE — Patient Instructions (Signed)
Medication Instructions:  Your physician recommends that you continue on your current medications as directed. Please refer to the Current Medication list given to you today.  *If you need a refill on your cardiac medications before your next appointment, please call your pharmacy*   Lab Work: None ordered   Testing/Procedures: None ordered   Follow-Up: At [facility], you and your health needs are our priority.  As part of our continuing mission to provide you with exceptional heart care, we have created designated Provider Care Teams.  These Care Teams include your primary Cardiologist (physician) and Advanced Practice Providers (APPs -  Physician Assistants and Nurse Practitioners) who all work together to provide you with the care you need, when you need it.  Remote monitoring is used to monitor your Pacemaker or ICD from home. This monitoring reduces the number of office visits required to check your device to one time per year. It allows [place] to keep an eye on the functioning of your device to ensure it is working properly. You are scheduled for a device check from home on 01/11/2021. You may send your transmission at any time that day. If you have a wireless device, the transmission will be sent automatically. After your physician reviews your transmission, you will receive a postcard with your next transmission date.  Your next appointment:   1 year(s)  The format for your next appointment:   In Person  Provider:   Allegra Lai, MD   Thank you for choosing [facility]!!   Trinidad Curet, RN [phone]

## 2020-12-22 NOTE — Progress Notes (Signed)
Remote ICD transmission.   

## 2020-12-25 ENCOUNTER — Encounter (HOSPITAL_COMMUNITY): Payer: Self-pay

## 2021-01-20 ENCOUNTER — Telehealth (HOSPITAL_COMMUNITY): Payer: Self-pay | Admitting: *Deleted

## 2021-01-20 NOTE — Telephone Encounter (Signed)
Pt called stating her weight is 186lbs she hasnt been weighing daily but normally her weight is about 178lbs. Pt said her abdomen is swollen and tight but denies shortness of breath, chest pain, and BLEE. Pt said she is taking all medications as prescribed but admits to eating salty food and drinking more fluids than she should.   Routed to [facility] for advice

## 2021-01-24 NOTE — Telephone Encounter (Signed)
Can try increasing torsemide to 40 mg bid x 1-2 days then back to down to 20 bid. Decrease sodium intake. Call back for further guidance if no improvement

## 2021-01-24 NOTE — Telephone Encounter (Signed)
Pt did not answer I left vm requesting return call.

## 2021-01-25 NOTE — Telephone Encounter (Signed)
Pt is aware and verbalized understanding.

## 2021-02-07 ENCOUNTER — Encounter: Payer: Self-pay | Admitting: *Deleted

## 2021-02-20 ENCOUNTER — Encounter (HOSPITAL_COMMUNITY): Payer: Self-pay | Admitting: Internal Medicine

## 2021-02-20 ENCOUNTER — Ambulatory Visit (HOSPITAL_COMMUNITY)
Admission: RE | Admit: 2021-02-20 | Discharge: 2021-02-20 | Disposition: A | Discharge status: Home / Self Care | Payer: Medicare Other | Source: Ambulatory Visit | Attending: Internal Medicine | Admitting: Internal Medicine

## 2021-02-20 ENCOUNTER — Other Ambulatory Visit: Payer: Self-pay

## 2021-02-20 VITALS — BP 166/78 | HR 71 | Wt 190.0 lb

## 2021-02-20 DIAGNOSIS — Z881 Allergy status to other antibiotic agents status: Secondary | ICD-10-CM | POA: Insufficient documentation

## 2021-02-20 DIAGNOSIS — Z791 Long term (current) use of non-steroidal anti-inflammatories (NSAID): Secondary | ICD-10-CM | POA: Insufficient documentation

## 2021-02-20 DIAGNOSIS — E785 Hyperlipidemia, unspecified: Secondary | ICD-10-CM | POA: Insufficient documentation

## 2021-02-20 DIAGNOSIS — I2510 Atherosclerotic heart disease of native coronary artery without angina pectoris: Secondary | ICD-10-CM | POA: Insufficient documentation

## 2021-02-20 DIAGNOSIS — Z8249 Family history of ischemic heart disease and other diseases of the circulatory system: Secondary | ICD-10-CM | POA: Insufficient documentation

## 2021-02-20 DIAGNOSIS — E1122 Type 2 diabetes mellitus with diabetic chronic kidney disease: Secondary | ICD-10-CM | POA: Diagnosis not present

## 2021-02-20 DIAGNOSIS — E7800 Pure hypercholesterolemia, unspecified: Secondary | ICD-10-CM | POA: Diagnosis not present

## 2021-02-20 DIAGNOSIS — I252 Old myocardial infarction: Secondary | ICD-10-CM | POA: Insufficient documentation

## 2021-02-20 DIAGNOSIS — I5022 Chronic systolic (congestive) heart failure: Secondary | ICD-10-CM | POA: Diagnosis present

## 2021-02-20 DIAGNOSIS — Z95810 Presence of automatic (implantable) cardiac defibrillator: Secondary | ICD-10-CM | POA: Insufficient documentation

## 2021-02-20 DIAGNOSIS — N1831 Chronic kidney disease, stage 3a: Secondary | ICD-10-CM | POA: Insufficient documentation

## 2021-02-20 DIAGNOSIS — I428 Other cardiomyopathies: Secondary | ICD-10-CM | POA: Insufficient documentation

## 2021-02-20 DIAGNOSIS — Z7982 Long term (current) use of aspirin: Secondary | ICD-10-CM | POA: Diagnosis not present

## 2021-02-20 DIAGNOSIS — Z880 Allergy status to penicillin: Secondary | ICD-10-CM | POA: Diagnosis not present

## 2021-02-20 DIAGNOSIS — Z888 Allergy status to other drugs, medicaments and biological substances status: Secondary | ICD-10-CM | POA: Diagnosis not present

## 2021-02-20 DIAGNOSIS — I10 Essential (primary) hypertension: Secondary | ICD-10-CM

## 2021-02-20 DIAGNOSIS — Z885 Allergy status to narcotic agent status: Secondary | ICD-10-CM | POA: Diagnosis not present

## 2021-02-20 DIAGNOSIS — Z79899 Other long term (current) drug therapy: Secondary | ICD-10-CM | POA: Insufficient documentation

## 2021-02-20 DIAGNOSIS — G4733 Obstructive sleep apnea (adult) (pediatric): Secondary | ICD-10-CM | POA: Insufficient documentation

## 2021-02-20 DIAGNOSIS — I130 Hypertensive heart and chronic kidney disease with heart failure and stage 1 through stage 4 chronic kidney disease, or unspecified chronic kidney disease: Secondary | ICD-10-CM | POA: Diagnosis not present

## 2021-02-20 LAB — COMPREHENSIVE METABOLIC PANEL
ALT: 18 U/L (ref 0–44)
AST: 26 U/L (ref 15–41)
Albumin: 3.6 g/dL (ref 3.5–5.0)
Alkaline Phosphatase: 128 U/L — ABNORMAL HIGH (ref 38–126)
Anion gap: 9 (ref 5–15)
BUN: 21 mg/dL (ref 8–23)
CO2: 26 mmol/L (ref 22–32)
Calcium: 8.9 mg/dL (ref 8.9–10.3)
Chloride: 102 mmol/L (ref 98–111)
Creatinine, Ser: 1.51 mg/dL — ABNORMAL HIGH (ref 0.44–1.00)
GFR, Estimated: 39 mL/min — ABNORMAL LOW (ref >60)
Glucose, Bld: 115 mg/dL — ABNORMAL HIGH (ref 70–99)
Potassium: 3.8 mmol/L (ref 3.5–5.1)
Sodium: 137 mmol/L (ref 135–145)
Total Bilirubin: 0.6 mg/dL (ref 0.3–1.2)
Total Protein: 7.5 g/dL (ref 6.5–8.1)

## 2021-02-20 LAB — TSH: TSH: 1.934 u[IU]/mL (ref 0.350–4.500)

## 2021-02-20 LAB — BRAIN NATRIURETIC PEPTIDE: B Natriuretic Peptide: 53 pg/mL (ref 0.0–100.0)

## 2021-02-20 LAB — T4, FREE: Free T4: 0.74 ng/dL (ref 0.61–1.12)

## 2021-02-20 MED ORDER — HYDRALAZINE HCL 100 MG PO TABS: 100.0000 mg | ORAL_TABLET | Freq: Three times a day (TID) | ORAL | 6 refills | Status: DC

## 2021-02-20 NOTE — Patient Instructions (Signed)
Increase Hydralazine to 100 mg Three times a day   Your physician has requested that you regularly monitor and record your blood pressure readings at home. Please use the same machine at the same time of day to check your readings and record them to bring to your follow-up visit. SEND [place] THE READINGS THROUGH MYCHART IN 2 WEEKS  Labs done today, your results will be available in MyChart, we will contact you for abnormal readings.  Please call our office in November to schedule your follow up appointment  If you have any questions or concerns before your next appointment please send [place] a message through [place] or call our office at [phone].    TO LEAVE A MESSAGE FOR THE NURSE SELECT OPTION 2, PLEASE LEAVE A MESSAGE INCLUDING: YOUR NAME DATE OF BIRTH CALL BACK NUMBER REASON FOR CALL**this is important as we prioritize the call backs  YOU WILL RECEIVE A CALL BACK THE SAME DAY AS LONG AS YOU CALL BEFORE 4:00 PM  At the [facility], you and your health needs are our priority. As part of our continuing mission to provide you with exceptional heart care, we have created designated Provider Care Teams. These Care Teams include your primary Cardiologist (physician) and Advanced Practice Providers (APPs- Physician Assistants and Nurse Practitioners) who all work together to provide you with the care you need, when you need it.   You may see any of the following providers on your designated Care Team at your next follow up: Dr Glori Bickers Dr Loralie Champagne Dr Patrice Paradise, NP Lyda Jester, [place] Ginnie Smart Audry Riles, PharmD   Please be sure to bring in all your medications bottles to every appointment.

## 2021-02-20 NOTE — Progress Notes (Signed)
Advanced Heart Failure Clinic Note   Primary Cardiologist: Dr. Haroldine Laws  Nephrology: Dr Valaria Good [facility].   Reason for Visit: [facility] F/u for CP/CAD and Chronic Systolic Heart Failure  HPI:  [name] is a 60 yo female with a past medical history of HTN, chronic systolic HF due to NICM (normal cors on cath 2014) s/p CRT-D ([employer]), atrial tachycardia (2017), CKD stage III, DM, OSA w/CPAP. 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   Admitted 4/18 with ADHF at [facility]. Echo that admission with EF 25-30%.   Her baseline creatinine appears to be 1.4-1.6 when reviewing her records from [facility]. Her last office visit with her Cardiologist Dr. Otho Perl was in January 2018 . He noted angioedema with ACE-I and cough with ARB.    Echo 3/20 LVEF 25-30%   Admitted 5/21 with atypical CP. HS troponin 33>34. Had NST that was interpreted by reading provider to have several areas of ischemia/infarct concerning for multivessel CAD. She was gently hydrated prior given CKD. LHC showed moderate, nonobstructive CAD w/ 50-60% RCA, flow wire 1.0 (non-flow limiting). Medical therapy recommended.  ICD changed in 1/22  She presents to clinic today for f/u. Says she is feeling good. Denies CP or SOB. Weight up about 20 pounds. Denies edema, orthopnea or PND. Compliant with all meds. Not following BPs at home.   ICD interrogated in clinic. No VT/AF. 100% BIV pacing Impedance not working Personally reviewed  Review of systems complete and found to be negative unless listed in HPI.    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   CAD (coronary artery disease) 09/01/2013   CHF (congestive heart failure) ([place]) 06/07/2015   CKD (chronic kidney disease), stage III ([place]) 12/09/2013   Depression 07/27/2013   Diabetes mellitus without complication ([place]) [id number]   GERD  (gastroesophageal reflux disease) 07/27/2013   Gout 12/29/2013   Heart valve disorder 06/03/2013   High cholesterol 11/10/2013   Hypertension 11/10/2013   IBS (irritable bowel syndrome) 09/27/2014   Myocardial infarct [facility])    Sleep apnea 10/13/2013   Vascular disorder 01/12/2014    Current Outpatient Medications  Medication Sig Dispense Refill   allopurinol (ZYLOPRIM) 100 MG tablet Take 200 mg by mouth daily.      amiodarone (PACERONE) 200 MG tablet Take 0.5 tablets (100 mg total) by mouth daily. 90 tablet 3   aspirin 81 MG chewable tablet Chew 81 mg by mouth daily.     atorvastatin (LIPITOR) 80 MG tablet Take 1 tablet (80 mg total) by mouth at bedtime. 90 tablet 3   Calcium Carbonate-Vitamin D 600-400 MG-UNIT tablet Take 1 tablet by mouth 2 (two) times daily.     carvedilol (COREG) 25 MG tablet Take 1 tablet (25 mg total) by mouth 2 (two) times daily with a meal. 180 tablet 3   colchicine 0.6 MG tablet Take 0.6 mg by mouth 2 (two) times daily as needed (Gout).     cyclobenzaprine (FLEXERIL) 5 MG tablet Take 5 mg by mouth 3 (three) times daily as needed for muscle spasms.     diclofenac sodium (VOLTAREN) 1 % GEL Apply 2 g topically 4 (four) times daily. 100 g 0   Dulaglutide 1.5 MG/0.5ML SOPN Inject 1.5 mg into the skin every Sunday.     empagliflozin (JARDIANCE) 10 MG TABS tablet Take 1 tablet (10 mg total) by mouth daily before breakfast. 90 tablet  3   ferrous sulfate 325 (65 FE) MG tablet Take 325 mg by mouth 3 (three) times daily.      hydrALAZINE (APRESOLINE) 25 MG tablet Take 25 mg by mouth 3 (three) times daily.     isosorbide mononitrate (IMDUR) 60 MG 24 hr tablet Take 1 tablet (60 mg total) by mouth daily. 30 tablet 0   loratadine (CLARITIN) 10 MG tablet Take 10 mg by mouth daily.     Magnesium Oxide 420 MG TABS Take 840 mg by mouth daily.     Multiple Vitamin (MULTIVITAMIN) tablet Take 1 tablet by mouth daily.     omeprazole (PRILOSEC) 40 MG capsule Take 40 mg by mouth daily.      potassium chloride SA (KLOR-CON) 20 MEQ tablet Take 20 mEq by mouth 2 (two) times daily.     pregabalin (LYRICA) 25 MG capsule Take 1 capsule (25 mg total) by mouth 2 (two) times daily.     rOPINIRole (REQUIP) 0.5 MG tablet Take 0.5 mg by mouth at bedtime.      sertraline (ZOLOFT) 100 MG tablet Take 150 mg by mouth at bedtime.      torsemide (DEMADEX) 20 MG tablet Take 1 tablet (20 mg total) by mouth 2 (two) times daily. 180 tablet 3   No current facility-administered medications for this encounter.   Allergies  Allergen Reactions   Codeine Itching   Gabapentin Itching   Haldol [Haloperidol Lactate] Other (See Comments)    Dizziness, hallucinations   Levofloxacin     Other reaction(s): Other (See Comments) Projectile vomiting    Lisinopril Itching and Swelling   Losartan Itching   Morphine And Related Nausea And Vomiting   Penicillins Nausea And Vomiting    "Vomiting, upset stomach, Headache" Has patient had a PCN reaction causing immediate rash, facial/tongue/throat swelling, SOB or lightheadedness with hypotension: No Has patient had a PCN reaction causing severe rash involving mucus membranes or skin necrosis: No Has patient had a PCN reaction that required hospitalization: No Has patient had a PCN reaction occurring within the last 10 years: No If all of the above answers are "NO", then may proceed with Cephalosporin use.    Social History   Socioeconomic History   Marital status: Single    Spouse name: Not on file   Number of children: Not on file   Years of education: Not on file   Highest education level: Not on file  Occupational History   Not on file  Tobacco Use   Smoking status: Never   Smokeless tobacco: Never  Vaping Use   Vaping Use: Never used  Substance and Sexual Activity   Alcohol use: No   Drug use: No   Sexual activity: Yes  Other Topics Concern   Not on file  Social History Narrative   Not on file   Social Determinants of Health    Financial Resource Strain: Not on file  Food Insecurity: Not on file  Transportation Needs: Not on file  Physical Activity: Not on file  Stress: Not on file  Social Connections: Not on file  Intimate Partner Violence: Not on file   Family History  Problem Relation Age of Onset   Hypertension Mother    Vitals:   02/20/21 1215  BP: (!) 166/78  Pulse: 71  SpO2: 96%  Weight: 86.2 kg (190 lb)   Wt Readings from Last 3 Encounters:  02/20/21 86.2 kg (190 lb)  12/19/20 85.3 kg (188 lb)  09/09/20 79.8 kg (176 lb)  PHYSICAL EXAM: General:  Well appearing. No resp difficulty HEENT: normal Neck: supple. no JVD. Carotids 2+ bilat; no bruits. No lymphadenopathy or thryomegaly appreciated. Cor: PMI nondisplaced. Regular rate & rhythm. No rubs, gallops or murmurs. Lungs: clear Abdomen: obese soft, nontender, nondistended. No hepatosplenomegaly. No bruits or masses. Good bowel sounds. Extremities: no cyanosis, clubbing, rash, edema Neuro: alert & orientedx3, cranial nerves grossly intact. moves all 4 extremities w/o difficulty. Affect pleasant    ASSESSMENT & PLAN: 1. Chronic systolic CHF:  - NICM, normal cors in 2014, likely related to HTN vs. Noncompliance vs. Tachy- medicated with history of atrial tach. - s/p STJ CRT-D - Echo 02/2017 EF 20%, moderate MR, moderate TR. PA pressure 80 mm Hg.    - Echo 11/2018 EF 25-30% - Cath 5/21: Nonobstructive CAD RCA 60% lesion - Echo 12/21 EF 25-30% RV ok  - Stable NYHA II   - Volume status looks ok despite weight gain  - Continue torsemide 20 mg BID.  - No ARB/ ANRI due to angioedema/CKD.  - No spiro nor digoxin due to CKD.  - Continue isosorbide 60 mg daily. - Increase hydralazine to 100 mg tid.  - Continue carvedilol 25 mg BID.  - Continue Farxiga 10 mg daily - Check labs today  - ICD interrogation in clinic as above  2. CAD. Non-obstructive - LHC 01/2020 50-60% RCA, negative FFR (non-flow limiting). - no s/s angina - continue  medical therapy w/ ASA + Statin+ Imdur + ? blocker.  3. HLD - continue atorva 80. - goal LDL < 70. - followed by PCP.  4. CKD stage IIIa  - Baseline creatinine 1.3-1.6.  - Labs from 11/18/20 reviewed. SCr 1.8 - follows with Dr. Aquilla Hacker.  5. HTN - elevated today (but has [place]at syndrome) - Increase hydralazine to 100 tid - She will keep home BP log daily x 2 weeks and send to me   6. History of atrial tachycardia -  Followed by Dr. Curt Bears;  -  Continue amiodarone 100 mg daily.  - Amio labs today  Glori Bickers, MD 02/20/21

## 2021-02-20 NOTE — Addendum Note (Signed)
Encounter addended by: Scarlette Calico, RN on: 02/20/2021 1:19 PM  Actions taken: Diagnosis association updated, Clinical Note Signed, Charge Capture section accepted, Order list changed, Pharmacy for encounter modified

## 2021-02-21 LAB — T3, FREE: T3, Free: 2.4 pg/mL (ref 2.0–4.4)

## 2021-02-22 ENCOUNTER — Telehealth: Payer: Self-pay

## 2021-02-22 NOTE — Telephone Encounter (Signed)
Pt referred to [facility] clinic by Dr Haroldine Laws.  Spoke with patient and ICM intro given.  She agreed to monthly ICM follow up.  Explained an in office device clinic appointment will be needed to change the device program to turn on fluid level monitoring.  Advised it will take about 4 weeks for the fluid levels to develop baseline and then will start monthly follow up.   She is agreeable to device clinic appointment to turn on CorVue.  Will forward to device clinic to call patient to set up an appointment.  Provided ICM number.

## 2021-02-22 NOTE — Telephone Encounter (Signed)
Message Received: 2 days ago Schub, Marsh Dolly, RN  P [facility] [address] Scheduling; Adessa Primiano Panda, RN Hey, can you guys please turn on her Corvue, thanks   Margarita Grizzle can you please start following it, thanks

## 2021-02-23 NOTE — Telephone Encounter (Signed)
Pt returned phone call.  Scheduled for appt with Device clinic on 6/28 '@1440'$ 

## 2021-02-23 NOTE — Telephone Encounter (Signed)
Attempted to reach pt to scheduled Device clinic appt to activate Corvue.  No answer.  LVM with device clinic phone number to callback to schedule appt.

## 2021-02-26 ENCOUNTER — Encounter (HOSPITAL_COMMUNITY): Payer: Self-pay

## 2021-02-28 ENCOUNTER — Other Ambulatory Visit: Payer: Self-pay

## 2021-02-28 ENCOUNTER — Ambulatory Visit (INDEPENDENT_AMBULATORY_CARE_PROVIDER_SITE_OTHER): Payer: Medicare Other

## 2021-02-28 DIAGNOSIS — I5023 Acute on chronic systolic (congestive) heart failure: Secondary | ICD-10-CM

## 2021-02-28 LAB — CUP PACEART INCLINIC DEVICE CHECK
Date Time Interrogation Session: 20220628155953
Implantable Lead Implant Date: 20150915
Implantable Lead Implant Date: 20150915
Implantable Lead Implant Date: 20150915
Implantable Lead Location: 753858
Implantable Lead Location: 753859
Implantable Lead Location: 753860
Implantable Pulse Generator Implant Date: 20220107
Pulse Gen Serial Number: 111036830

## 2021-02-28 NOTE — Patient Instructions (Signed)
3:30 pm. Your blood pressure was 113/68. Your Heart rate 67 bpm.

## 2021-02-28 NOTE — Progress Notes (Signed)
Patient seen in device clinic. CoreVue programmed on.

## 2021-03-01 NOTE — Telephone Encounter (Signed)
Error

## 2021-03-10 ENCOUNTER — Ambulatory Visit (INDEPENDENT_AMBULATORY_CARE_PROVIDER_SITE_OTHER): Payer: Medicare Other

## 2021-03-10 DIAGNOSIS — I429 Cardiomyopathy, unspecified: Secondary | ICD-10-CM

## 2021-03-12 LAB — CUP PACEART REMOTE DEVICE CHECK
Battery Remaining Longevity: 84 mo
Battery Remaining Percentage: 92 %
Battery Voltage: 2.99 V
Brady Statistic AP VP Percent: 1 %
Brady Statistic AP VS Percent: 1 %
Brady Statistic AS VP Percent: 99 %
Brady Statistic AS VS Percent: 1 %
Brady Statistic RA Percent Paced: 1 %
Date Time Interrogation Session: 20220708021241
HighPow Impedance: 66 Ohm
Implantable Lead Implant Date: 20150915
Implantable Lead Implant Date: 20150915
Implantable Lead Implant Date: 20150915
Implantable Lead Location: 753858
Implantable Lead Location: 753859
Implantable Lead Location: 753860
Implantable Pulse Generator Implant Date: 20220107
Lead Channel Impedance Value: 330 Ohm
Lead Channel Impedance Value: 390 Ohm
Lead Channel Impedance Value: 430 Ohm
Lead Channel Pacing Threshold Amplitude: 0.75 V
Lead Channel Pacing Threshold Amplitude: 1.375 V
Lead Channel Pacing Threshold Amplitude: 1.375 V
Lead Channel Pacing Threshold Pulse Width: 0.5 ms
Lead Channel Pacing Threshold Pulse Width: 0.5 ms
Lead Channel Pacing Threshold Pulse Width: 0.5 ms
Lead Channel Sensing Intrinsic Amplitude: 12 mV
Lead Channel Sensing Intrinsic Amplitude: 3.4 mV
Lead Channel Setting Pacing Amplitude: 1.75 V
Lead Channel Setting Pacing Amplitude: 1.875
Lead Channel Setting Pacing Amplitude: 2.375
Lead Channel Setting Pacing Pulse Width: 0.5 ms
Lead Channel Setting Pacing Pulse Width: 0.5 ms
Lead Channel Setting Sensing Sensitivity: 0.5 mV
Pulse Gen Serial Number: 111036830

## 2021-03-29 ENCOUNTER — Telehealth: Payer: Self-pay

## 2021-03-29 ENCOUNTER — Ambulatory Visit (INDEPENDENT_AMBULATORY_CARE_PROVIDER_SITE_OTHER): Payer: Medicare Other

## 2021-03-29 DIAGNOSIS — Z95810 Presence of automatic (implantable) cardiac defibrillator: Secondary | ICD-10-CM | POA: Diagnosis not present

## 2021-03-29 DIAGNOSIS — I5022 Chronic systolic (congestive) heart failure: Secondary | ICD-10-CM | POA: Diagnosis not present

## 2021-03-29 NOTE — Progress Notes (Signed)
EPIC Encounter for ICM Monitoring  Patient Name: Michelle Andrade is a 60 y.o. female Date: 03/29/2021 Primary Care Physican: Zachery Dauer, MD Primary Cardiologist: [place] Electrophysiologist: Cyndi Bender Pacing: >99%  02/20/2021 Office Weight: 190 lbs        1st ICM Remote Transmission.  Attempted call to patient and unable to reach.  Left detailed message per DPR regarding transmission. Transmission reviewed.    CorVue thoracic impedance normal but was suggesting possible dryness from 03/21/21 - 03/26/21.  CorVue turned on 03/14/2021.  Prescribed:  Torsemide 20 mg take 1 tablet (20 mg total) by mouth twice a day. Potassium 20 mEq take 1 tablet (20 mEq total) by mouth twice a day.  Recommendations: Left voice mail with ICM number and encouraged to call if experiencing any fluid symptoms.  Follow-up plan: ICM clinic phone appointment on 05/01/2021.   91 day device clinic remote transmission 06/09/2021.    EP/Cardiology Office Visits: Recall 08/20/2021 with HF Clinic PA/NP.  Recall 12/14/2021 with Dr Curt Bears.  Copy of ICM check sent to Dr. Curt Bears.   3 month ICM trend: 03/29/2021.    1 Year ICM trend:       [name], RN 03/29/2021 11:37 AM

## 2021-03-29 NOTE — Telephone Encounter (Signed)
Remote ICM transmission received.  Attempted call to patient regarding ICM remote transmission and left detailed message per DPR.  Advised to return call for any fluid symptoms or questions. Next ICM remote transmission scheduled 05/01/2021.

## 2021-03-31 NOTE — Progress Notes (Signed)
Remote ICD transmission.   

## 2021-04-05 ENCOUNTER — Other Ambulatory Visit (HOSPITAL_COMMUNITY): Payer: Self-pay

## 2021-04-05 MED ORDER — POTASSIUM CHLORIDE CRYS ER 20 MEQ PO TBCR: 20.0000 meq | EXTENDED_RELEASE_TABLET | Freq: Two times a day (BID) | ORAL | 3 refills | Status: DC

## 2021-04-05 NOTE — Telephone Encounter (Signed)
Meds ordered this encounter  Medications   potassium chloride SA (KLOR-CON) 20 MEQ tablet    Sig: Take 1 tablet (20 mEq total) by mouth 2 (two) times daily.    Dispense:  180 tablet    Refill:  3

## 2021-05-01 ENCOUNTER — Ambulatory Visit (INDEPENDENT_AMBULATORY_CARE_PROVIDER_SITE_OTHER): Payer: Medicare Other

## 2021-05-01 DIAGNOSIS — I5022 Chronic systolic (congestive) heart failure: Secondary | ICD-10-CM | POA: Diagnosis not present

## 2021-05-01 DIAGNOSIS — Z95810 Presence of automatic (implantable) cardiac defibrillator: Secondary | ICD-10-CM

## 2021-05-02 ENCOUNTER — Telehealth: Payer: Self-pay

## 2021-05-02 NOTE — Telephone Encounter (Signed)
Remote ICM transmission received.  Attempted call to patient regarding ICM remote transmission and left message per DPR to return call.   

## 2021-05-02 NOTE — Progress Notes (Signed)
EPIC Encounter for ICM Monitoring  Patient Name: Lyrica Baros is a 60 y.o. female Date: 05/02/2021 Primary Care Physican: Zachery Dauer, MD Primary Cardiologist: [place] Electrophysiologist: Cyndi Bender Pacing: >99%          02/20/2021 Office Weight: 190 lbs                                                             Attempted call to patient and unable to reach.  Left detailed message to return call. Transmission reviewed.    CorVue thoracic impedance normal but was suggesting possible dryness from 8/20-8/23 and 8/25-8/27.  CorVue turned on 03/14/2021.   Prescribed:  Torsemide 20 mg take 1 tablet (20 mg total) by mouth twice a day. Potassium 20 mEq take 1 tablet (20 mEq total) by mouth twice a day.   Recommendations: Left voice mail with ICM number and encouraged to call back.   Follow-up plan: ICM clinic phone appointment on 06/05/2021.   91 day device clinic remote transmission 06/09/2021.     EP/Cardiology Office Visits: Recall 08/20/2021 with HF Clinic PA/NP.  Recall 12/14/2021 with Dr Curt Bears.   Copy of ICM check sent to Dr. Curt Bears.    3 month ICM trend: 05/01/2021.    1 Year ICM trend:       [name], RN 05/02/2021 3:05 PM

## 2021-05-02 NOTE — Progress Notes (Signed)
Spoke with patient and heart failure questions reviewed.  Pt asymptomatic for fluid accumulation and feeling well.  No changes and encouraged to call if experiencing any fluid symptoms.

## 2021-05-30 ENCOUNTER — Telehealth (HOSPITAL_COMMUNITY): Payer: Self-pay | Admitting: *Deleted

## 2021-05-30 NOTE — Telephone Encounter (Signed)
Pt called c/o left eye floaters that started over the weekend. She would like to know what medication she can take to fix this. I instructed her to make an appt with her eye doctor for an exam to diagnose the issue. She is concerned that medications may interact with her HF medications. Made aware that once she has seen eye doctor, if they prescribe medications, we can check for any interactions with her heart failure medications if needed. She verbalized understanding, and was very appreciative of return call.   Emerson Monte RN [place] Coordinator  Office: [phone]  24/7 Pager: [phone]

## 2021-06-05 ENCOUNTER — Ambulatory Visit (INDEPENDENT_AMBULATORY_CARE_PROVIDER_SITE_OTHER): Payer: Medicare Other

## 2021-06-05 DIAGNOSIS — Z95810 Presence of automatic (implantable) cardiac defibrillator: Secondary | ICD-10-CM | POA: Diagnosis not present

## 2021-06-05 DIAGNOSIS — I5022 Chronic systolic (congestive) heart failure: Secondary | ICD-10-CM | POA: Diagnosis not present

## 2021-06-05 NOTE — Progress Notes (Signed)
EPIC Encounter for ICM Monitoring  Patient Name: Michelle Andrade is a 60 y.o. female Date: 06/05/2021 Primary Care Physican: Zachery Dauer, MD Primary Cardiologist: [place] Electrophysiologist: Cyndi Bender Pacing: >99%          05/25/2021 Office Weight: 186 lbs                                                             Spoke with patient and heart failure questions reviewed.  Pt asymptomatic for fluid accumulation and feeling well.  She reports drinking >64 oz fluid daily.   CorVue thoracic impedance suggesting possible fluid accumulation starting 06/04/2021.   Prescribed:  Torsemide 20 mg take 1 tablet (20 mg total) by mouth twice a day. Potassium 20 mEq take 1 tablet (20 mEq total) by mouth twice a day.   Labs: 05/25/2021 Creatinine 1.86, BUN 21, Potassium 3.9, Sodium 139, GFR 30  02/20/2021 Creatinine 1.51, BUN 21, Potassium 3.8, Sodium 137, GFR 39 A complete set of results can be found in Results Review.  Recommendations:  Encouraged to limit fluid intake to 64 oz daily.  Advised to call if experiences any fluid symptoms.    Follow-up plan: ICM clinic phone appointment on 06/09/2021 to recheck fluid levels.   91 day device clinic remote transmission 06/09/2021.     EP/Cardiology Office Visits: Recall 08/20/2021 with HF Clinic PA/NP.  Recall 12/14/2021 with Dr Curt Bears.   Copy of ICM check sent to Dr. Curt Bears.     3 month ICM trend: 06/05/2021.    1 Year ICM trend:       [name], RN 06/05/2021 2:44 PM

## 2021-06-06 ENCOUNTER — Telehealth: Payer: Self-pay | Admitting: Licensed Clinical Social Worker

## 2021-06-06 ENCOUNTER — Telehealth: Payer: Self-pay

## 2021-06-06 NOTE — Telephone Encounter (Signed)
LCSW spoke with pt this afternoon and invited pt to our [facility] women's group tomorrow. Encouraged her to call [place] or I with any additional questions.    Westley Hummer, MSW, [place]  [phone]

## 2021-06-06 NOTE — Telephone Encounter (Signed)
The patient called to let us know she is having a procedure on 07/14/2021. [facility] are going to give her a magnet to put over her ICD for the upcoming procedure in November.

## 2021-06-09 ENCOUNTER — Ambulatory Visit (INDEPENDENT_AMBULATORY_CARE_PROVIDER_SITE_OTHER): Payer: Medicare Other

## 2021-06-09 DIAGNOSIS — I428 Other cardiomyopathies: Secondary | ICD-10-CM | POA: Diagnosis not present

## 2021-06-09 DIAGNOSIS — Z95810 Presence of automatic (implantable) cardiac defibrillator: Secondary | ICD-10-CM

## 2021-06-09 DIAGNOSIS — I5022 Chronic systolic (congestive) heart failure: Secondary | ICD-10-CM

## 2021-06-09 NOTE — Progress Notes (Signed)
EPIC Encounter for ICM Monitoring  Patient Name: Michelle Andrade is a 60 y.o. female Date: 06/09/2021 Primary Care Physican: Zachery Dauer, MD Primary Cardiologist: [place] Electrophysiologist: Cyndi Bender Pacing: >99%          05/25/2021 Office Weight: 186 lbs                                                             Spoke with patient and heart failure questions reviewed.  Pt asymptomatic for fluid accumulation and feeling well.  She reports drinking >64 oz fluid daily and eating foods in high in salt.   CorVue thoracic impedance suggesting fluid levels returned close to normal.   Prescribed:  Torsemide 20 mg take 1 tablet (20 mg total) by mouth twice a day. Potassium 20 mEq take 1 tablet (20 mEq total) by mouth twice a day.   Labs: 05/25/2021 Creatinine 1.86, BUN 21, Potassium 3.9, Sodium 139, GFR 30  02/20/2021 Creatinine 1.51, BUN 21, Potassium 3.8, Sodium 137, GFR 39 A complete set of results can be found in Results Review.   Recommendations:  Encouraged to limit fluid intake to 64 oz daily.  Advised to call if experiences any fluid symptoms.    Follow-up plan: ICM clinic phone appointment on 07/10/2021.   91 day device clinic remote transmission 09/08/2021.     EP/Cardiology Office Visits: Recall 08/20/2021 with HF Clinic PA/NP.  Recall 12/14/2021 with Dr Curt Bears.   Copy of ICM check sent to Dr. Curt Bears.     3 month ICM trend: 06/09/2021.    1 Year ICM trend:       [name], RN 06/09/2021 4:39 PM

## 2021-06-11 LAB — CUP PACEART REMOTE DEVICE CHECK
Battery Remaining Longevity: 82 mo
Battery Remaining Percentage: 88 %
Battery Voltage: 2.98 V
Brady Statistic AP VP Percent: 1.3 %
Brady Statistic AP VS Percent: 1 %
Brady Statistic AS VP Percent: 98 %
Brady Statistic AS VS Percent: 1 %
Brady Statistic RA Percent Paced: 1.2 %
Date Time Interrogation Session: 20221007020115
HighPow Impedance: 69 Ohm
Implantable Lead Implant Date: 20150915
Implantable Lead Implant Date: 20150915
Implantable Lead Implant Date: 20150915
Implantable Lead Location: 753858
Implantable Lead Location: 753859
Implantable Lead Location: 753860
Implantable Pulse Generator Implant Date: 20220107
Lead Channel Impedance Value: 340 Ohm
Lead Channel Impedance Value: 390 Ohm
Lead Channel Impedance Value: 430 Ohm
Lead Channel Pacing Threshold Amplitude: 0.75 V
Lead Channel Pacing Threshold Amplitude: 1.25 V
Lead Channel Pacing Threshold Amplitude: 1.5 V
Lead Channel Pacing Threshold Pulse Width: 0.4 ms
Lead Channel Pacing Threshold Pulse Width: 0.4 ms
Lead Channel Pacing Threshold Pulse Width: 0.4 ms
Lead Channel Sensing Intrinsic Amplitude: 12 mV
Lead Channel Sensing Intrinsic Amplitude: 3.3 mV
Lead Channel Setting Pacing Amplitude: 1.75 V
Lead Channel Setting Pacing Amplitude: 2.25 V
Lead Channel Setting Pacing Amplitude: 2.5 V
Lead Channel Setting Pacing Pulse Width: 0.4 ms
Lead Channel Setting Pacing Pulse Width: 0.4 ms
Lead Channel Setting Sensing Sensitivity: 0.5 mV
Pulse Gen Serial Number: 111036830

## 2021-06-19 NOTE — Progress Notes (Signed)
Remote ICD transmission.   

## 2021-06-28 IMAGING — DX DG KNEE COMPLETE 4+V*R*
4 series · 4 of 4 positions shown · non-contrast
Comparison: None.

CLINICAL DATA: Fall today with pain and swelling.

EXAM:
RIGHT KNEE - COMPLETE 4+ VIEW

[knee ap]
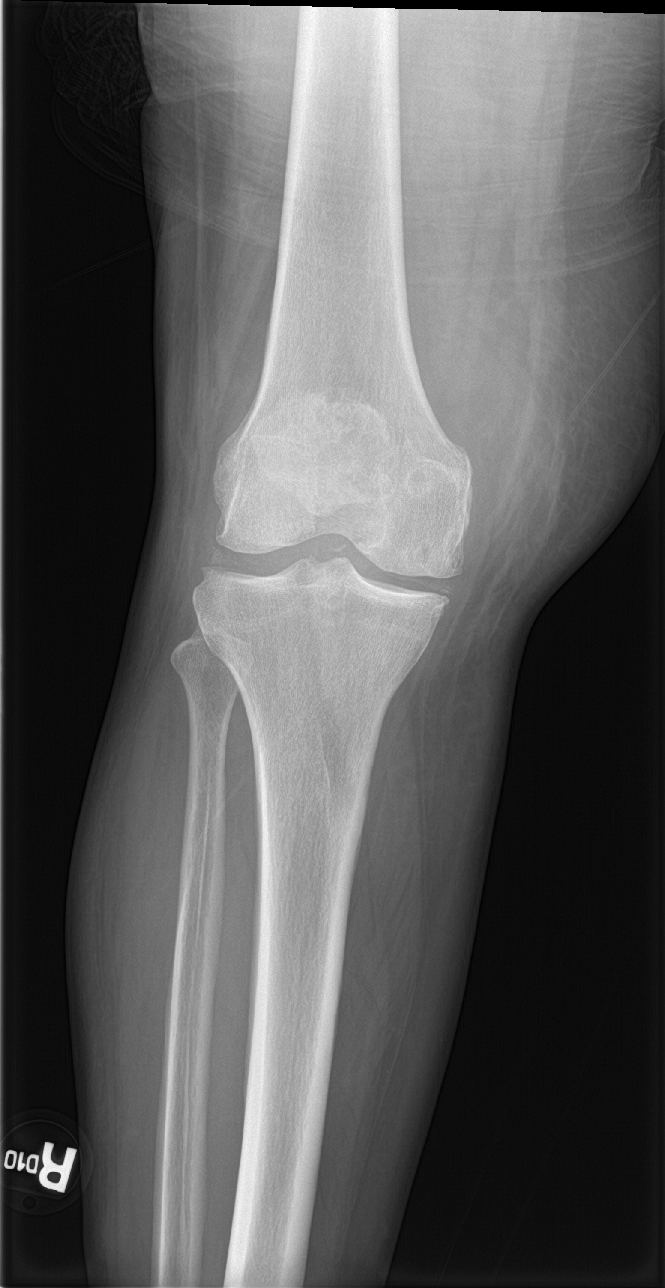

[knee lat]
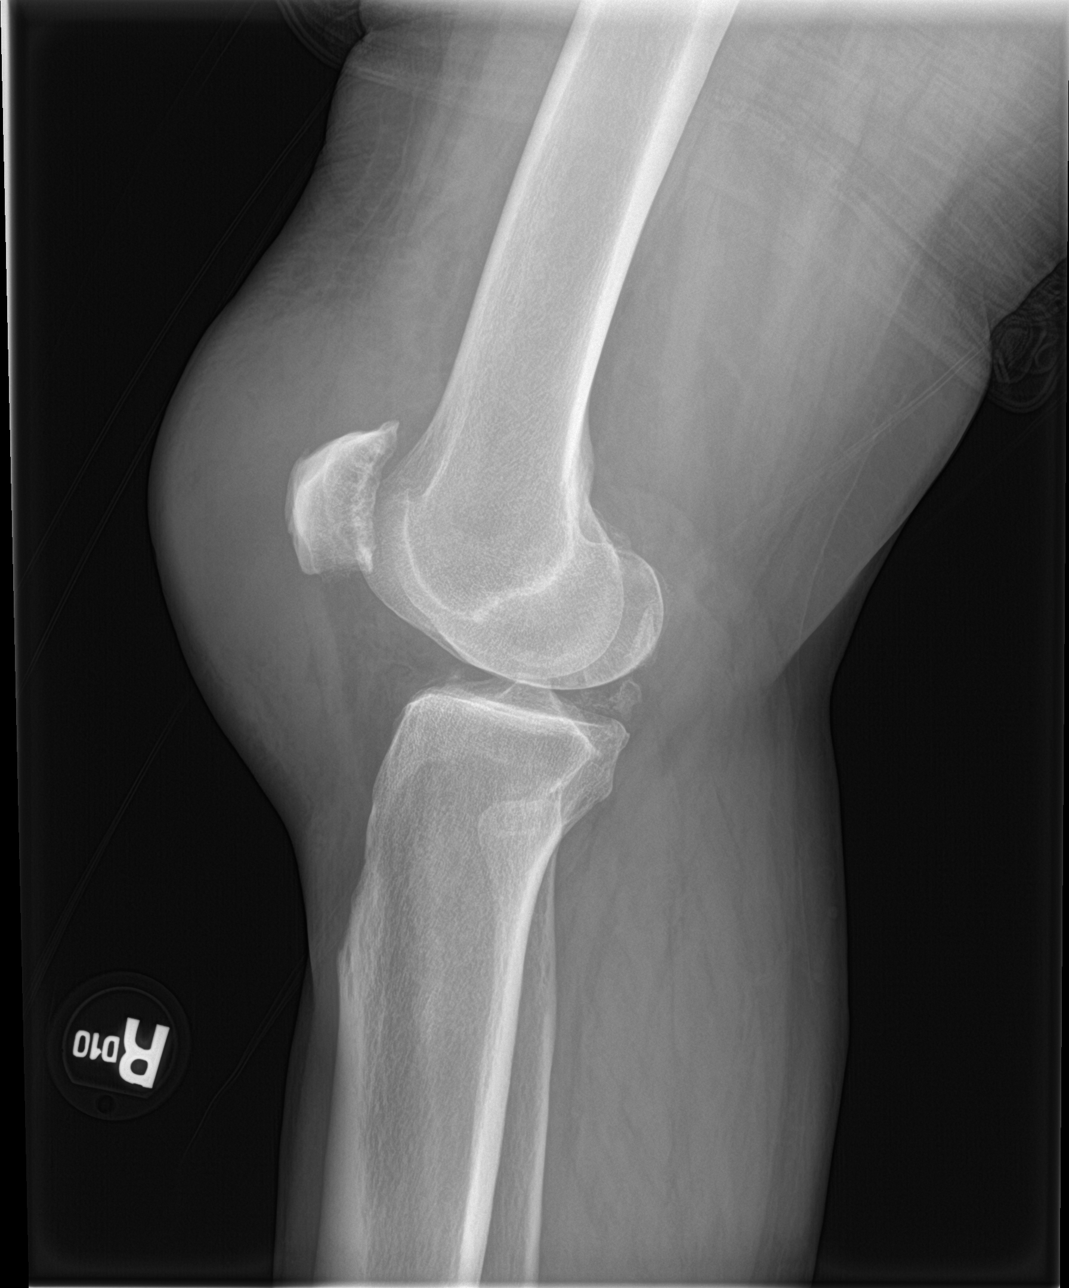

[knee obl (1 of 2)]
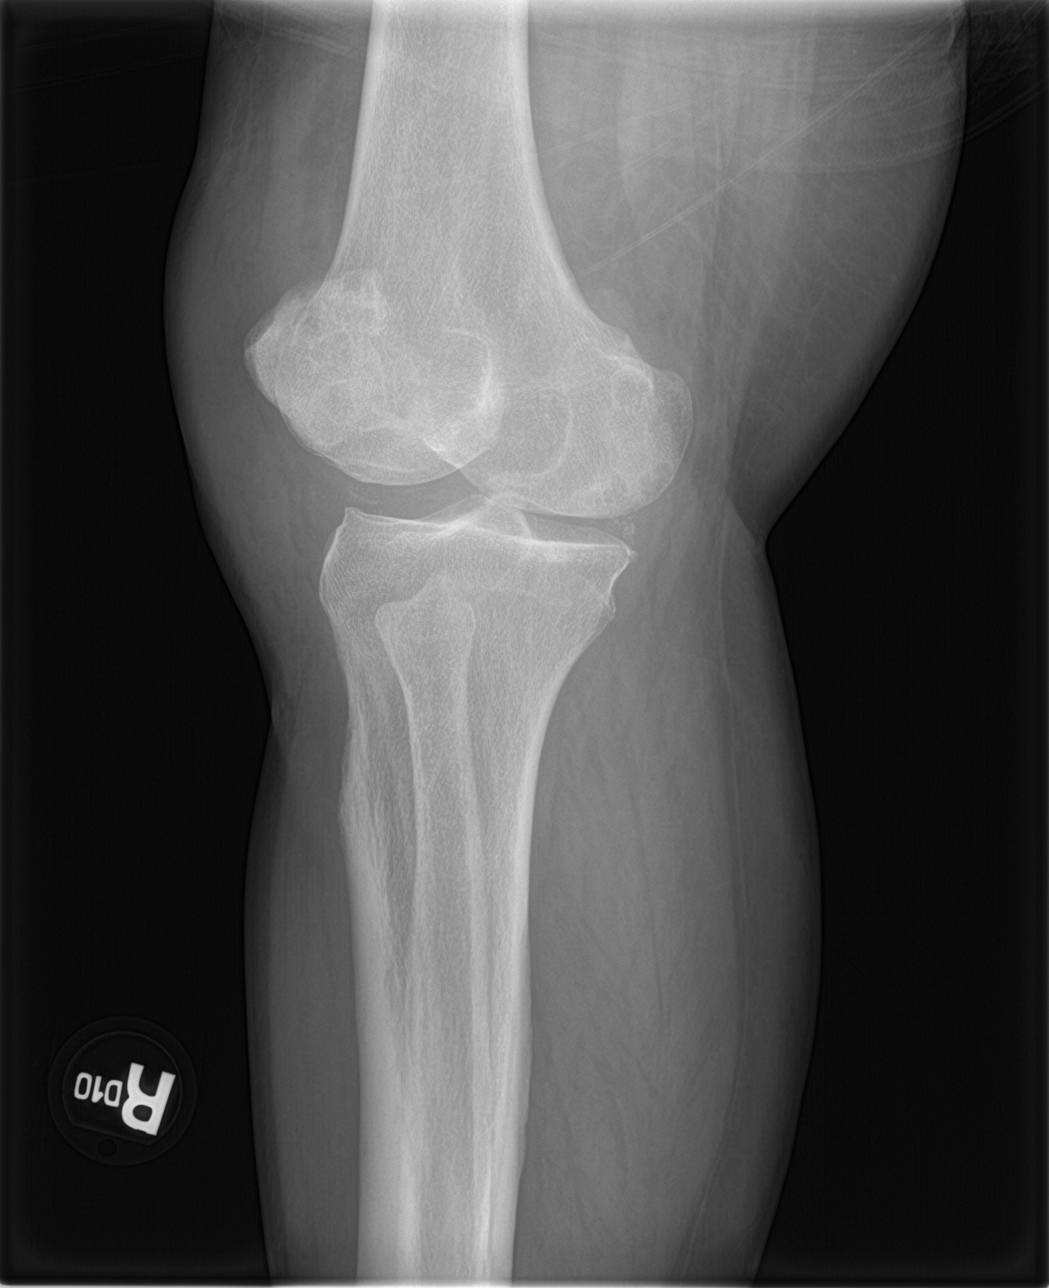

[knee obl (2 of 2)]
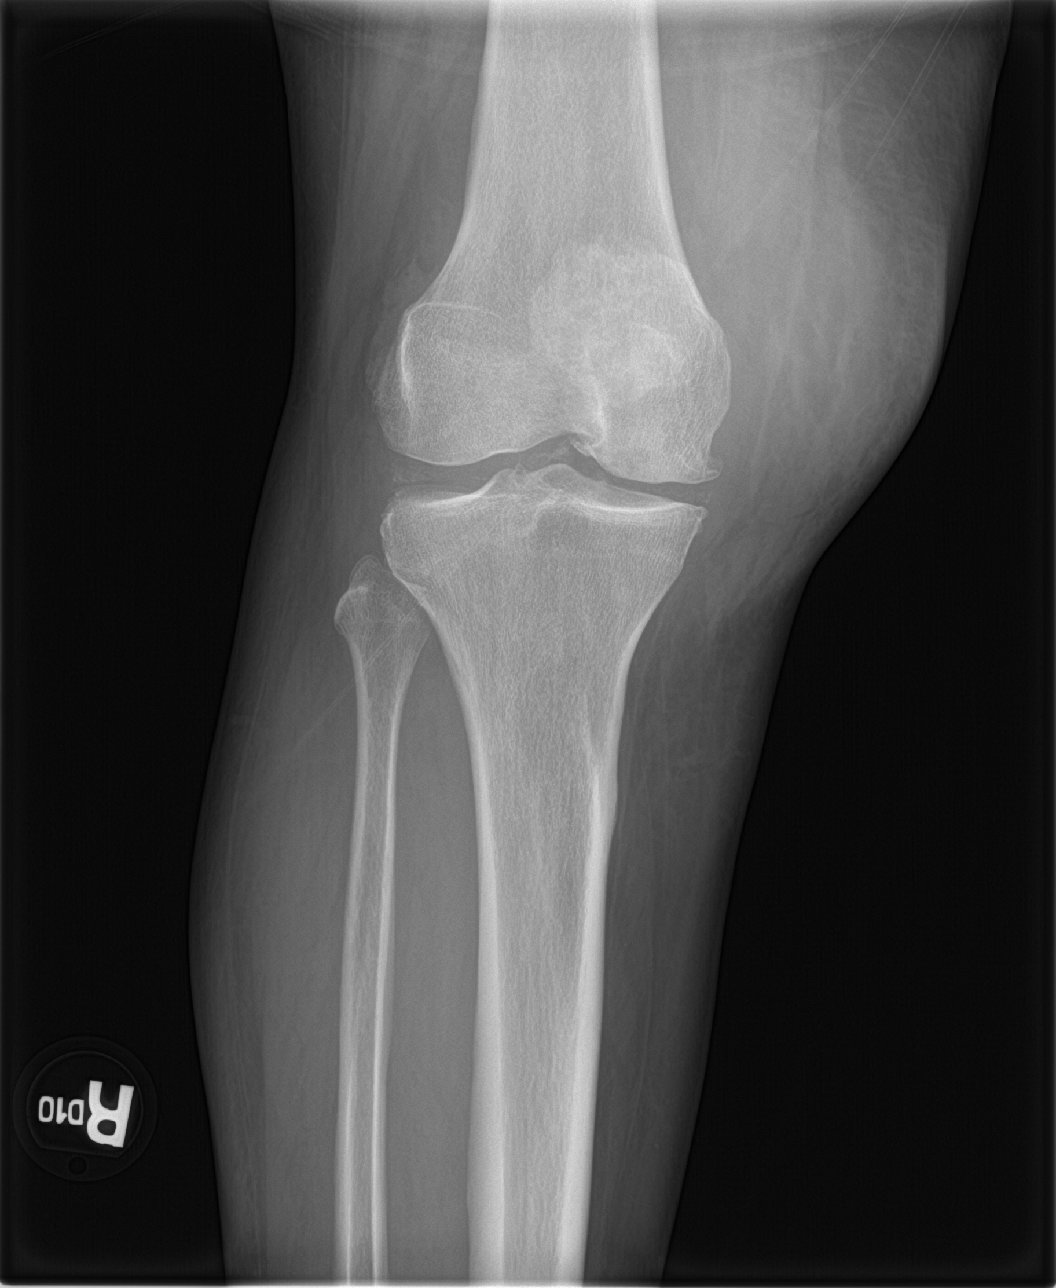

[4 of 4 positions shown; findings below may reference images not displayed]

FINDINGS: Moderate patellofemoral with mild medial/lateral compartment
degenerative change including joint space narrowing and osteophyte
formation. Chondrocalcinosis within the menisci. Marked prepatellar
soft tissue swelling. Limited evaluation for joint effusion. No
acute fracture or dislocation.
IMPRESSION: Marked prepatellar soft tissue swelling, without acute fracture or
dislocation.

Osteoarthritis. Chondrocalcinosis within the menisci, consistent
with calcium pyrophosphate deposition disease.

## 2021-07-10 ENCOUNTER — Ambulatory Visit (INDEPENDENT_AMBULATORY_CARE_PROVIDER_SITE_OTHER): Payer: Medicare Other

## 2021-07-10 DIAGNOSIS — I5022 Chronic systolic (congestive) heart failure: Secondary | ICD-10-CM | POA: Diagnosis not present

## 2021-07-10 DIAGNOSIS — Z95810 Presence of automatic (implantable) cardiac defibrillator: Secondary | ICD-10-CM | POA: Diagnosis not present

## 2021-07-11 NOTE — Progress Notes (Signed)
EPIC Encounter for ICM Monitoring  Patient Name: Michelle Andrade is a 60 y.o. female Date: 07/11/2021 Primary Care Physican: Zachery Dauer, MD Primary Cardiologist: [place] Electrophysiologist: Cyndi Bender Pacing: >99%          05/25/2021 Office Weight: 186 lbs                                                             Spoke with patient and heart failure questions reviewed.  Pt asymptomatic for fluid accumulation and feeling well.  She reports drinking >64 oz fluid daily.   CorVue thoracic impedance suggesting normal fluid levels.   Prescribed:  Torsemide 20 mg take 1 tablet (20 mg total) by mouth twice a day. Potassium 20 mEq take 1 tablet (20 mEq total) by mouth twice a day.   Labs: 05/25/2021 Creatinine 1.86, BUN 21, Potassium 3.9, Sodium 139, GFR 30  02/20/2021 Creatinine 1.51, BUN 21, Potassium 3.8, Sodium 137, GFR 39 A complete set of results can be found in Results Review.   Recommendations:  Encouraged to limit fluid intake to 64 oz daily.  Advised to call if experiences any fluid symptoms.    Follow-up plan: ICM clinic phone appointment on 08/14/2021.   91 day device clinic remote transmission 09/08/2021.     EP/Cardiology Office Visits: Recall 08/20/2021 with HF Clinic PA/NP.  Recall 12/14/2021 with Dr Curt Bears.   Copy of ICM check sent to Dr. Curt Bears.      3 month ICM trend: 07/07/2021.     [name], RN 07/11/2021 4:11 PM

## 2021-08-14 ENCOUNTER — Ambulatory Visit (INDEPENDENT_AMBULATORY_CARE_PROVIDER_SITE_OTHER): Payer: Medicare Other

## 2021-08-14 DIAGNOSIS — I5022 Chronic systolic (congestive) heart failure: Secondary | ICD-10-CM

## 2021-08-14 DIAGNOSIS — Z95810 Presence of automatic (implantable) cardiac defibrillator: Secondary | ICD-10-CM

## 2021-08-16 ENCOUNTER — Telehealth: Payer: Self-pay

## 2021-08-16 NOTE — Telephone Encounter (Signed)
Remote ICM transmission received.  Attempted call to patient regarding ICM remote transmission and left detailed message per DPR.  Advised to return call for any fluid symptoms or questions. Next ICM remote transmission scheduled 09/18/2021.

## 2021-08-16 NOTE — Progress Notes (Signed)
EPIC Encounter for ICM Monitoring  Patient Name: Michelle Andrade is a 60 y.o. female Date: 08/16/2021 Primary Care Physican: Zachery Dauer, MD Primary Cardiologist: [place] Electrophysiologist: Cyndi Bender Pacing: >99%          05/25/2021 Office Weight: 186 lbs                                                             Attempted call to patient and unable to reach.  Left detailed message per DPR regarding transmission. Transmission reviewed.    CorVue thoracic impedance suggesting normal fluid levels.   Prescribed:  Torsemide 20 mg take 1 tablet (20 mg total) by mouth twice a day. Potassium 20 mEq take 1 tablet (20 mEq total) by mouth twice a day.   Labs: 05/25/2021 Creatinine 1.86, BUN 21, Potassium 3.9, Sodium 139, GFR 30  02/20/2021 Creatinine 1.51, BUN 21, Potassium 3.8, Sodium 137, GFR 39 A complete set of results can be found in Results Review.   Recommendations:  Left voice mail with ICM number and encouraged to call if experiencing any fluid symptoms.   Follow-up plan: ICM clinic phone appointment on 09/18/2021.   91 day device clinic remote transmission 09/08/2021.     EP/Cardiology Office Visits: Recall 08/20/2021 with HF Clinic PA/NP.  Recall 12/14/2021 with Dr Curt Bears.   Copy of ICM check sent to Dr. Curt Bears.      3 month ICM trend: 08/14/2021.    12-14 Month ICM trend:       [name], RN 08/16/2021 1:07 PM

## 2021-09-08 ENCOUNTER — Ambulatory Visit (INDEPENDENT_AMBULATORY_CARE_PROVIDER_SITE_OTHER): Payer: Medicare Other

## 2021-09-08 DIAGNOSIS — I429 Cardiomyopathy, unspecified: Secondary | ICD-10-CM | POA: Diagnosis not present

## 2021-09-10 LAB — CUP PACEART REMOTE DEVICE CHECK
Battery Remaining Longevity: 79 mo
Battery Remaining Percentage: 86 %
Battery Voltage: 2.98 V
Brady Statistic AP VP Percent: 1 %
Brady Statistic AP VS Percent: 1 %
Brady Statistic AS VP Percent: 99 %
Brady Statistic AS VS Percent: 1 %
Brady Statistic RA Percent Paced: 1 %
Date Time Interrogation Session: 20230106020535
HighPow Impedance: 68 Ohm
Implantable Lead Implant Date: 20150915
Implantable Lead Implant Date: 20150915
Implantable Lead Implant Date: 20150915
Implantable Lead Location: 753858
Implantable Lead Location: 753859
Implantable Lead Location: 753860
Implantable Pulse Generator Implant Date: 20220107
Lead Channel Impedance Value: 340 Ohm
Lead Channel Impedance Value: 390 Ohm
Lead Channel Impedance Value: 430 Ohm
Lead Channel Pacing Threshold Amplitude: 0.75 V
Lead Channel Pacing Threshold Amplitude: 1.25 V
Lead Channel Pacing Threshold Amplitude: 1.375 V
Lead Channel Pacing Threshold Pulse Width: 0.4 ms
Lead Channel Pacing Threshold Pulse Width: 0.4 ms
Lead Channel Pacing Threshold Pulse Width: 0.4 ms
Lead Channel Sensing Intrinsic Amplitude: 12 mV
Lead Channel Sensing Intrinsic Amplitude: 3.5 mV
Lead Channel Setting Pacing Amplitude: 1.75 V
Lead Channel Setting Pacing Amplitude: 2.25 V
Lead Channel Setting Pacing Amplitude: 2.375
Lead Channel Setting Pacing Pulse Width: 0.4 ms
Lead Channel Setting Pacing Pulse Width: 0.4 ms
Lead Channel Setting Sensing Sensitivity: 0.5 mV
Pulse Gen Serial Number: 111036830

## 2021-09-18 ENCOUNTER — Ambulatory Visit (INDEPENDENT_AMBULATORY_CARE_PROVIDER_SITE_OTHER): Payer: Medicare Other

## 2021-09-18 DIAGNOSIS — Z95810 Presence of automatic (implantable) cardiac defibrillator: Secondary | ICD-10-CM

## 2021-09-18 DIAGNOSIS — I5022 Chronic systolic (congestive) heart failure: Secondary | ICD-10-CM

## 2021-09-18 NOTE — Progress Notes (Signed)
Remote ICD transmission.   

## 2021-09-19 ENCOUNTER — Telehealth: Payer: Self-pay

## 2021-09-19 NOTE — Telephone Encounter (Signed)
Remote ICM transmission received.  Attempted call to patient regarding ICM remote transmission and no answer or voice mail option.  

## 2021-09-19 NOTE — Progress Notes (Signed)
EPIC Encounter for ICM Monitoring  Patient Name: Michelle Andrade is a 61 y.o. female Date: 09/19/2021 Primary Care Physican: Zachery Dauer, MD Primary Cardiologist: Bensimhon Electrophysiologist: Cyndi Bender Pacing: >99%          05/25/2021 Office Weight: 186 lbs                                                             Attempted call to patient and unable to reach.  Transmission reviewed.    CorVue thoracic impedance suggesting normal fluid levels.   Prescribed:  Torsemide 20 mg take 1 tablet (20 mg total) by mouth twice a day. Potassium 20 mEq take 1 tablet (20 mEq total) by mouth twice a day.   Labs: 05/25/2021 Creatinine 1.86, BUN 21, Potassium 3.9, Sodium 139, GFR 30  02/20/2021 Creatinine 1.51, BUN 21, Potassium 3.8, Sodium 137, GFR 39 A complete set of results can be found in Results Review.   Recommendations:  Unable to reach.     Follow-up plan: ICM clinic phone appointment on 10/23/2021.   91 day device clinic remote transmission 12/08/2021.     EP/Cardiology Office Visits: Recall 08/20/2021 with HF Clinic PA/NP.  Recall 12/14/2021 with Dr Curt Bears.   Copy of ICM check sent to Dr. Curt Bears.      3 month ICM trend: 09/18/2021.    12-14 Month ICM trend:     [name], RN 09/19/2021 3:57 PM

## 2021-10-23 ENCOUNTER — Ambulatory Visit (INDEPENDENT_AMBULATORY_CARE_PROVIDER_SITE_OTHER): Payer: Medicare Other

## 2021-10-23 DIAGNOSIS — I5022 Chronic systolic (congestive) heart failure: Secondary | ICD-10-CM

## 2021-10-23 DIAGNOSIS — Z95810 Presence of automatic (implantable) cardiac defibrillator: Secondary | ICD-10-CM | POA: Diagnosis not present

## 2021-10-25 NOTE — Progress Notes (Signed)
EPIC Encounter for ICM Monitoring  Patient Name: Andrianna Manalang is a 61 y.o. female Date: 10/25/2021 Primary Care Physican: Zachery Dauer, MD Primary Cardiologist: [place] Electrophysiologist: Camnitz Bi-V Pacing: >99%          10/25/2021 Weight: 174 lbs                                                             Spoke with patient and heart failure questions reviewed.  Pt reports she is getting over the flu but feeling better.    CorVue thoracic impedance suggesting normal fluid levels.   Prescribed:  Torsemide 20 mg take 1 tablet (20 mg total) by mouth twice a day. Potassium 20 mEq take 1 tablet (20 mEq total) by mouth twice a day.   Labs: 05/25/2021 Creatinine 1.86, BUN 21, Potassium 3.9, Sodium 139, GFR 30  02/20/2021 Creatinine 1.51, BUN 21, Potassium 3.8, Sodium 137, GFR 39 A complete set of results can be found in Results Review.   Recommendations:  No changes and encouraged to call if experiencing any fluid symptoms.   Follow-up plan: ICM clinic phone appointment on 11/27/2021.   91 day device clinic remote transmission 12/08/2021.     EP/Cardiology Office Visits:   Recall 08/20/2021 with HF Clinic PA/NP.  Recall 12/14/2021 with Dr Curt Bears.   Copy of ICM check sent to Dr. Curt Bears.      3 month ICM trend: 10/23/2021.    12-14 Month ICM trend:     [name], RN 10/25/2021 11:47 AM

## 2021-10-30 ENCOUNTER — Encounter (HOSPITAL_COMMUNITY): Payer: Self-pay | Admitting: Internal Medicine

## 2021-11-13 NOTE — Progress Notes (Incomplete)
Advanced Heart Failure Clinic Note   Primary Cardiologist: Dr. Haroldine Laws  Nephrology: Dr Valaria Good [facility].   Reason for Visit: Michelle Andrade F/u for CP/CAD and Chronic Systolic Heart Failure  HPI:  Michelle Andrade is a 61 yo female with a past medical history of HTN, chronic systolic HF due to NICM (normal cors on cath 2014) s/p CRT-D ([employer]), atrial tachycardia (2017), CKD stage III, DM, OSA w/CPAP. 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   Admitted 4/18 with ADHF at [facility]. Echo that admission with EF 25-30%.   Her baseline creatinine appears to be 1.4-1.6 when reviewing her records from [facility]. Her last office visit with her Cardiologist Dr. Otho Perl was in January 2018 . He noted angioedema with ACE-I and cough with ARB.    Echo 3/20 LVEF 25-30%   Admitted 5/21 with atypical CP. HS troponin 33>34. Had NST that was interpreted by reading provider to have several areas of ischemia/infarct concerning for multivessel CAD. She was gently hydrated prior given CKD. LHC showed moderate, nonobstructive CAD w/ 50-60% RCA, flow wire 1.0 (non-flow limiting). Medical therapy recommended.  ICD changed in 1/22  She presents to clinic today for f/u. Says she is feeling good. Denies CP or SOB. Weight up about 20 pounds. Denies edema, orthopnea or PND. Compliant with all meds. Not following BPs at home.   ICD interrogated in clinic. No VT/AF. 100% BIV pacing Impedance not working Personally reviewed  Review of systems complete and found to be negative unless listed in HPI.    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   CAD (coronary artery disease) 09/01/2013   CHF (congestive heart failure) ([place]) 06/07/2015   CKD (chronic kidney disease), stage III ([place]) 12/09/2013   Depression 07/27/2013   Diabetes mellitus without complication ([place]) 84/13/2440   GERD  (gastroesophageal reflux disease) 07/27/2013   Gout 12/29/2013   Heart valve disorder 06/03/2013   High cholesterol 11/10/2013   Hypertension 11/10/2013   IBS (irritable bowel syndrome) 09/27/2014   Myocardial infarct [facility])    Sleep apnea 10/13/2013   Vascular disorder 01/12/2014    Current Outpatient Medications  Medication Sig Dispense Refill   allopurinol (ZYLOPRIM) 100 MG tablet Take 200 mg by mouth daily.      amiodarone (PACERONE) 200 MG tablet Take 0.5 tablets (100 mg total) by mouth daily. 90 tablet 3   aspirin 81 MG chewable tablet Chew 81 mg by mouth daily.     atorvastatin (LIPITOR) 80 MG tablet Take 1 tablet (80 mg total) by mouth at bedtime. 90 tablet 3   Calcium Carbonate-Vitamin D 600-400 MG-UNIT tablet Take 1 tablet by mouth 2 (two) times daily.     carvedilol (COREG) 25 MG tablet Take 1 tablet (25 mg total) by mouth 2 (two) times daily with a meal. 180 tablet 3   colchicine 0.6 MG tablet Take 0.6 mg by mouth 2 (two) times daily as needed (Gout).     cyclobenzaprine (FLEXERIL) 5 MG tablet Take 5 mg by mouth 3 (three) times daily as needed for muscle spasms.     diclofenac sodium (VOLTAREN) 1 % GEL Apply 2 g topically 4 (four) times daily. 100 g 0   Dulaglutide 1.5 MG/0.5ML SOPN Inject 1.5 mg into the skin every Sunday.     empagliflozin (JARDIANCE) 10 MG TABS tablet Take 1 tablet (10 mg total) by mouth daily before breakfast. 90 tablet  3   ferrous sulfate 325 (65 FE) MG tablet Take 325 mg by mouth 3 (three) times daily.      hydrALAZINE (APRESOLINE) 100 MG tablet Take 1 tablet (100 mg total) by mouth 3 (three) times daily. 90 tablet 6   isosorbide mononitrate (IMDUR) 60 MG 24 hr tablet Take 1 tablet (60 mg total) by mouth daily. 30 tablet 0   loratadine (CLARITIN) 10 MG tablet Take 10 mg by mouth daily.     Magnesium Oxide 420 MG TABS Take 840 mg by mouth daily.     Multiple Vitamin (MULTIVITAMIN) tablet Take 1 tablet by mouth daily.     omeprazole (PRILOSEC) 40 MG  capsule Take 40 mg by mouth daily.     potassium chloride SA (KLOR-CON) 20 MEQ tablet Take 1 tablet (20 mEq total) by mouth 2 (two) times daily. 180 tablet 3   pregabalin (LYRICA) 25 MG capsule Take 1 capsule (25 mg total) by mouth 2 (two) times daily.     rOPINIRole (REQUIP) 0.5 MG tablet Take 0.5 mg by mouth at bedtime.      sertraline (ZOLOFT) 100 MG tablet Take 150 mg by mouth at bedtime.      torsemide (DEMADEX) 20 MG tablet Take 1 tablet (20 mg total) by mouth 2 (two) times daily. 180 tablet 3   No current facility-administered medications for this visit.   Allergies  Allergen Reactions   Codeine Itching   Gabapentin Itching   Haldol [Haloperidol Lactate] Other (See Comments)    Dizziness, hallucinations   Levofloxacin     Other reaction(s): Other (See Comments) Projectile vomiting    Lisinopril Itching and Swelling   Losartan Itching   Morphine And Related Nausea And Vomiting   Penicillins Nausea And Vomiting    "Vomiting, upset stomach, Headache" Has patient had a PCN reaction causing immediate rash, facial/tongue/throat swelling, SOB or lightheadedness with hypotension: No Has patient had a PCN reaction causing severe rash involving mucus membranes or skin necrosis: No Has patient had a PCN reaction that required hospitalization: No Has patient had a PCN reaction occurring within the last 10 years: No If all of the above answers are "NO", then may proceed with Cephalosporin use.    Social History   Socioeconomic History   Marital status: Single    Spouse name: Not on file   Number of children: Not on file   Years of education: Not on file   Highest education level: Not on file  Occupational History   Not on file  Tobacco Use   Smoking status: Never   Smokeless tobacco: Never  Vaping Use   Vaping Use: Never used  Substance and Sexual Activity   Alcohol use: No   Drug use: No   Sexual activity: Yes  Other Topics Concern   Not on file  Social History Narrative    Not on file   Social Determinants of Health   Financial Resource Strain: Not on file  Food Insecurity: Not on file  Transportation Needs: Not on file  Physical Activity: Not on file  Stress: Not on file  Social Connections: Not on file  Intimate Partner Violence: Not on file   Family History  Problem Relation Age of Onset   Hypertension Mother    There were no vitals filed for this visit.  Wt Readings from Last 3 Encounters:  02/20/21 86.2 kg (190 lb)  12/19/20 85.3 kg (188 lb)  09/09/20 79.8 kg (176 lb)     PHYSICAL EXAM:  General:  Well appearing. No resp difficulty HEENT: normal Neck: supple. no JVD. Carotids 2+ bilat; no bruits. No lymphadenopathy or thryomegaly appreciated. Cor: PMI nondisplaced. Regular rate & rhythm. No rubs, gallops or murmurs. Lungs: clear Abdomen: obese soft, nontender, nondistended. No hepatosplenomegaly. No bruits or masses. Good bowel sounds. Extremities: no cyanosis, clubbing, rash, edema Neuro: alert & orientedx3, cranial nerves grossly intact. moves all 4 extremities w/o difficulty. Affect pleasant    ASSESSMENT & PLAN: 1. Chronic systolic CHF:  - NICM, normal cors in 2014, likely related to HTN vs. Noncompliance vs. Tachy- medicated with history of atrial tach. - s/p STJ CRT-D - Echo 02/2017 EF 20%, moderate MR, moderate TR. PA pressure 80 mm Hg.    - Echo 11/2018 EF 25-30% - Cath 5/21: Nonobstructive CAD RCA 60% lesion - Echo 12/21 EF 25-30% RV ok  - Stable NYHA II   - Volume status looks ok despite weight gain  - Continue torsemide 20 mg BID.  - No ARB/ ANRI due to angioedema/CKD.  - No spiro nor digoxin due to CKD.  - Continue isosorbide 60 mg daily. - Increase hydralazine to 100 mg tid.  - Continue carvedilol 25 mg BID.  - Continue Farxiga 10 mg daily - Check labs today  - ICD interrogation in clinic as above  2. CAD. Non-obstructive - LHC 01/2020 50-60% RCA, negative FFR (non-flow limiting). - no s/s angina - continue  medical therapy w/ ASA + Statin+ Imdur + ? blocker.  3. HLD - continue atorva 80. - goal LDL < 70. - followed by PCP.  4. CKD stage IIIa  - Baseline creatinine 1.3-1.6.  - Labs from 11/18/20 reviewed. SCr 1.8 - follows with Dr. Aquilla Hacker.  5. HTN - elevated today (but has [place]at syndrome) - Increase hydralazine to 100 tid - She will keep home BP log daily x 2 weeks and send to me   6. History of atrial tachycardia -  Followed by Dr. Curt Bears;  -  Continue amiodarone 100 mg daily.  - Amio labs today  Michelle Andrade, [place] 11/13/21

## 2021-11-14 ENCOUNTER — Ambulatory Visit (HOSPITAL_COMMUNITY)
Admission: RE | Admit: 2021-11-14 | Discharge: 2021-11-14 | Disposition: A | Discharge status: Home / Self Care | Payer: Medicare Other | Source: Ambulatory Visit | Attending: Family Medicine | Admitting: Family Medicine

## 2021-11-14 ENCOUNTER — Encounter (HOSPITAL_COMMUNITY): Payer: Self-pay

## 2021-11-14 ENCOUNTER — Other Ambulatory Visit: Payer: Self-pay

## 2021-11-14 VITALS — BP 116/74 | HR 78 | Wt 174.0 lb

## 2021-11-14 DIAGNOSIS — Z7984 Long term (current) use of oral hypoglycemic drugs: Secondary | ICD-10-CM | POA: Diagnosis not present

## 2021-11-14 DIAGNOSIS — E7800 Pure hypercholesterolemia, unspecified: Secondary | ICD-10-CM | POA: Diagnosis not present

## 2021-11-14 DIAGNOSIS — R0789 Other chest pain: Secondary | ICD-10-CM | POA: Insufficient documentation

## 2021-11-14 DIAGNOSIS — G4733 Obstructive sleep apnea (adult) (pediatric): Secondary | ICD-10-CM | POA: Diagnosis not present

## 2021-11-14 DIAGNOSIS — Z79899 Other long term (current) drug therapy: Secondary | ICD-10-CM | POA: Diagnosis not present

## 2021-11-14 DIAGNOSIS — I471 Supraventricular tachycardia: Secondary | ICD-10-CM

## 2021-11-14 DIAGNOSIS — N1831 Chronic kidney disease, stage 3a: Secondary | ICD-10-CM | POA: Insufficient documentation

## 2021-11-14 DIAGNOSIS — Z9989 Dependence on other enabling machines and devices: Secondary | ICD-10-CM | POA: Diagnosis not present

## 2021-11-14 DIAGNOSIS — E785 Hyperlipidemia, unspecified: Secondary | ICD-10-CM | POA: Diagnosis not present

## 2021-11-14 DIAGNOSIS — Z8249 Family history of ischemic heart disease and other diseases of the circulatory system: Secondary | ICD-10-CM | POA: Diagnosis not present

## 2021-11-14 DIAGNOSIS — Z01810 Encounter for preprocedural cardiovascular examination: Secondary | ICD-10-CM | POA: Diagnosis not present

## 2021-11-14 DIAGNOSIS — E1122 Type 2 diabetes mellitus with diabetic chronic kidney disease: Secondary | ICD-10-CM | POA: Insufficient documentation

## 2021-11-14 DIAGNOSIS — I5022 Chronic systolic (congestive) heart failure: Secondary | ICD-10-CM | POA: Diagnosis not present

## 2021-11-14 DIAGNOSIS — I428 Other cardiomyopathies: Secondary | ICD-10-CM | POA: Diagnosis not present

## 2021-11-14 DIAGNOSIS — I2510 Atherosclerotic heart disease of native coronary artery without angina pectoris: Secondary | ICD-10-CM | POA: Diagnosis not present

## 2021-11-14 DIAGNOSIS — R9431 Abnormal electrocardiogram [ECG] [EKG]: Secondary | ICD-10-CM | POA: Diagnosis not present

## 2021-11-14 DIAGNOSIS — Z7982 Long term (current) use of aspirin: Secondary | ICD-10-CM | POA: Diagnosis not present

## 2021-11-14 DIAGNOSIS — Z4502 Encounter for adjustment and management of automatic implantable cardiac defibrillator: Secondary | ICD-10-CM | POA: Insufficient documentation

## 2021-11-14 DIAGNOSIS — I10 Essential (primary) hypertension: Secondary | ICD-10-CM

## 2021-11-14 DIAGNOSIS — Z95810 Presence of automatic (implantable) cardiac defibrillator: Secondary | ICD-10-CM | POA: Insufficient documentation

## 2021-11-14 DIAGNOSIS — I130 Hypertensive heart and chronic kidney disease with heart failure and stage 1 through stage 4 chronic kidney disease, or unspecified chronic kidney disease: Secondary | ICD-10-CM | POA: Diagnosis present

## 2021-11-14 DIAGNOSIS — N183 Chronic kidney disease, stage 3 unspecified: Secondary | ICD-10-CM

## 2021-11-14 LAB — COMPREHENSIVE METABOLIC PANEL
ALT: 30 U/L (ref 0–44)
AST: 43 U/L — ABNORMAL HIGH (ref 15–41)
Albumin: 3.2 g/dL — ABNORMAL LOW (ref 3.5–5.0)
Alkaline Phosphatase: 140 U/L — ABNORMAL HIGH (ref 38–126)
Anion gap: 7 (ref 5–15)
BUN: 14 mg/dL (ref 8–23)
CO2: 25 mmol/L (ref 22–32)
Calcium: 8.9 mg/dL (ref 8.9–10.3)
Chloride: 105 mmol/L (ref 98–111)
Creatinine, Ser: 1.58 mg/dL — ABNORMAL HIGH (ref 0.44–1.00)
GFR, Estimated: 37 mL/min — ABNORMAL LOW (ref >60)
Glucose, Bld: 119 mg/dL — ABNORMAL HIGH (ref 70–99)
Potassium: 3.8 mmol/L (ref 3.5–5.1)
Sodium: 137 mmol/L (ref 135–145)
Total Bilirubin: 0.2 mg/dL — ABNORMAL LOW (ref 0.3–1.2)
Total Protein: 7.2 g/dL (ref 6.5–8.1)

## 2021-11-14 LAB — BRAIN NATRIURETIC PEPTIDE: B Natriuretic Peptide: 43.5 pg/mL (ref 0.0–100.0)

## 2021-11-14 NOTE — Patient Instructions (Addendum)
Thank you for coming in today ? ?Labs were done today, if any labs are abnormal the clinic will call you ? ?INCREASE Torsemide to 40 mg twice daily for 5 days  the return back to 20 mg twice daily  ? ?Your physician recommends that you schedule a follow-up appointment in:  ?2 weeks in clinic  ? ?At the [facility], you and your health needs are our priority. As part of our continuing mission to provide you with exceptional heart care, we have created designated Provider Care Teams. These Care Teams include your primary Cardiologist (physician) and Advanced Practice Providers (APPs- Physician Assistants and Nurse Practitioners) who all work together to provide you with the care you need, when you need it.  ? ?You may see any of the following providers on your designated Care Team at your next follow up: ?Dr Glori Bickers ?Dr Loralie Champagne ?Darrick Grinder, NP ?Lyda Jester, PA ?Jessica Milford,NP ?Marlyce Huge, PA ?Audry Riles, PharmD ? ? ?Please be sure to bring in all your medications bottles to every appointment.  ? ?If you have any questions or concerns before your next appointment please send [place] a message through [place] or call our office at [phone].   ? ?TO LEAVE A MESSAGE FOR THE NURSE SELECT OPTION 2, PLEASE LEAVE A MESSAGE INCLUDING: ?YOUR NAME ?DATE OF BIRTH ?CALL BACK NUMBER ?REASON FOR CALL**this is important as we prioritize the call backs ? ?YOU WILL RECEIVE A CALL BACK THE SAME DAY AS LONG AS YOU CALL BEFORE 4:00 PM ? ? ?

## 2021-11-15 ENCOUNTER — Telehealth (HOSPITAL_COMMUNITY): Payer: Self-pay | Admitting: *Deleted

## 2021-11-15 MED ORDER — TORSEMIDE 20 MG PO TABS: 40.0000 mg | ORAL_TABLET | Freq: Two times a day (BID) | ORAL | 3 refills | Status: DC

## 2021-11-15 NOTE — Telephone Encounter (Signed)
Pt called stating when she got home yesterday she realized she is already taking torsemide 40mg  bid not 20mg  bid. Per Janett Billow Milford,[name] pt was to increase torsemide to 40mg  bid x 5days. She asked if Janett Billow Milford,[name] wanted her to make any other changes.  ?

## 2021-11-15 NOTE — Telephone Encounter (Signed)
Pt aware and agreeable with plan.  

## 2021-11-18 ENCOUNTER — Emergency Department (HOSPITAL_BASED_OUTPATIENT_CLINIC_OR_DEPARTMENT_OTHER): Payer: Medicare Other

## 2021-11-18 ENCOUNTER — Emergency Department (HOSPITAL_BASED_OUTPATIENT_CLINIC_OR_DEPARTMENT_OTHER)
Admission: EM | Admit: 2021-11-18 | Discharge: 2021-11-18 | Disposition: A | Discharge status: Home / Self Care | Payer: Medicare Other | Attending: Emergency Medicine | Admitting: Emergency Medicine

## 2021-11-18 ENCOUNTER — Other Ambulatory Visit: Payer: Self-pay

## 2021-11-18 ENCOUNTER — Encounter (HOSPITAL_BASED_OUTPATIENT_CLINIC_OR_DEPARTMENT_OTHER): Payer: Self-pay | Admitting: Emergency Medicine

## 2021-11-18 DIAGNOSIS — R079 Chest pain, unspecified: Secondary | ICD-10-CM

## 2021-11-18 DIAGNOSIS — R0602 Shortness of breath: Secondary | ICD-10-CM | POA: Insufficient documentation

## 2021-11-18 DIAGNOSIS — Z7982 Long term (current) use of aspirin: Secondary | ICD-10-CM | POA: Insufficient documentation

## 2021-11-18 DIAGNOSIS — R778 Other specified abnormalities of plasma proteins: Secondary | ICD-10-CM | POA: Insufficient documentation

## 2021-11-18 DIAGNOSIS — I110 Hypertensive heart disease with heart failure: Secondary | ICD-10-CM | POA: Insufficient documentation

## 2021-11-18 DIAGNOSIS — R7989 Other specified abnormal findings of blood chemistry: Secondary | ICD-10-CM | POA: Diagnosis not present

## 2021-11-18 DIAGNOSIS — Z7984 Long term (current) use of oral hypoglycemic drugs: Secondary | ICD-10-CM | POA: Insufficient documentation

## 2021-11-18 DIAGNOSIS — I509 Heart failure, unspecified: Secondary | ICD-10-CM | POA: Insufficient documentation

## 2021-11-18 DIAGNOSIS — Z95810 Presence of automatic (implantable) cardiac defibrillator: Secondary | ICD-10-CM | POA: Insufficient documentation

## 2021-11-18 DIAGNOSIS — Z79899 Other long term (current) drug therapy: Secondary | ICD-10-CM | POA: Diagnosis not present

## 2021-11-18 DIAGNOSIS — E1165 Type 2 diabetes mellitus with hyperglycemia: Secondary | ICD-10-CM | POA: Insufficient documentation

## 2021-11-18 DIAGNOSIS — I2510 Atherosclerotic heart disease of native coronary artery without angina pectoris: Secondary | ICD-10-CM | POA: Insufficient documentation

## 2021-11-18 DIAGNOSIS — R0789 Other chest pain: Secondary | ICD-10-CM | POA: Insufficient documentation

## 2021-11-18 LAB — CBC
HCT: 40.1 % (ref 36.0–46.0)
Hemoglobin: 13.3 g/dL (ref 12.0–15.0)
MCH: 31.3 pg (ref 26.0–34.0)
MCHC: 33.2 g/dL (ref 30.0–36.0)
MCV: 94.4 fL (ref 80.0–100.0)
Platelets: 275 10*3/uL (ref 150–400)
RBC: 4.25 MIL/uL (ref 3.87–5.11)
RDW: 15.5 % (ref 11.5–15.5)
WBC: 5.6 10*3/uL (ref 4.0–10.5)
nRBC: 0 % (ref 0.0–0.2)

## 2021-11-18 LAB — BASIC METABOLIC PANEL
Anion gap: 10 (ref 5–15)
BUN: 15 mg/dL (ref 8–23)
CO2: 25 mmol/L (ref 22–32)
Calcium: 9 mg/dL (ref 8.9–10.3)
Chloride: 102 mmol/L (ref 98–111)
Creatinine, Ser: 1.67 mg/dL — ABNORMAL HIGH (ref 0.44–1.00)
GFR, Estimated: 34 mL/min — ABNORMAL LOW (ref >60)
Glucose, Bld: 117 mg/dL — ABNORMAL HIGH (ref 70–99)
Potassium: 3.7 mmol/L (ref 3.5–5.1)
Sodium: 137 mmol/L (ref 135–145)

## 2021-11-18 LAB — TROPONIN I (HIGH SENSITIVITY)
Troponin I (High Sensitivity): 37 ng/L — ABNORMAL HIGH (ref <18)
Troponin I (High Sensitivity): 35 ng/L — ABNORMAL HIGH (ref <18)

## 2021-11-18 LAB — BRAIN NATRIURETIC PEPTIDE: B Natriuretic Peptide: 36.9 pg/mL (ref 0.0–100.0)

## 2021-11-18 MED ORDER — LIDOCAINE 5 % EX PTCH
1.0000 | MEDICATED_PATCH | CUTANEOUS | Status: DC
  Administered 2021-11-18: 1 via TRANSDERMAL
  Filled 2021-11-18: qty 1

## 2021-11-18 MED ORDER — ASPIRIN 81 MG PO CHEW
324.0000 mg | CHEWABLE_TABLET | Freq: Once | ORAL | Status: AC
  Administered 2021-11-18: 324 mg via ORAL
  Filled 2021-11-18: qty 4

## 2021-11-18 MED ORDER — NITROGLYCERIN 0.4 MG SL SUBL
0.4000 mg | SUBLINGUAL_TABLET | SUBLINGUAL | Status: DC | PRN
  Administered 2021-11-18 (×2): 0.4 mg via SUBLINGUAL
  Filled 2021-11-18: qty 1

## 2021-11-18 MED ORDER — ACETAMINOPHEN 500 MG PO TABS
1000.0000 mg | ORAL_TABLET | Freq: Once | ORAL | Status: AC
  Administered 2021-11-18: 1000 mg via ORAL
  Filled 2021-11-18: qty 2

## 2021-11-18 NOTE — ED Notes (Signed)
Pt ambulatory to waiting room. Pt verbalized understanding of discharge instructions.   

## 2021-11-18 NOTE — Discharge Instructions (Signed)
You were seen in the emergency department for 2 weeks of left-sided chest pain.  You had blood work EKG and a chest x-ray that did not show any obvious explanation for your pain.  This is likely muscular.  Please continue your regular medications.  Follow-up with your cardiology team. ?

## 2021-11-18 NOTE — ED Provider Notes (Signed)
?[facility] EMERGENCY DEPARTMENT ?Provider Note ? ? ?CSN: [id number] ?Arrival date & time: 11/18/21  1333 ? ?  ? ?History ? ?Chief Complaint  ?Patient presents with  ? Chest Pain  ? ? ?Michelle Andrade is a 61 y.o. female.  She has a history of coronary disease, cardiomyopathy, CHF, AICD.  She is here with 2 weeks of intermittent left-sided chest pain getting progressively worse.  Radiates into her left arm and left neck.  Does not seem to be provoked by exertion.  Is worse with movement.  She initially thought it was a pulled muscle but it had not gone away.  She was at her cardiologist office 3 days ago and told him about the pain.  They increased her diuretic dose for few days.  She has been compliant with that.  She also feels a little more short of breath.  No cough fevers.  Chest pain not pleuritic. ? ?The history is provided by the patient.  ?Chest Pain ?Pain location:  L chest ?Pain quality: stabbing   ?Pain radiates to:  L arm and neck ?Pain severity:  Moderate ?Onset quality:  Gradual ?Duration:  2 weeks ?Timing:  Intermittent ?Progression:  Worsening ?Chronicity:  New ?Context: movement and raising an arm   ?Relieved by:  Nothing ?Worsened by:  Certain positions and movement ?Ineffective treatments:  None tried ?Associated symptoms: shortness of breath   ?Associated symptoms: no abdominal pain, no cough, no diaphoresis, no fatigue, no fever, no headache, no nausea and no vomiting   ?Risk factors: coronary artery disease, high cholesterol and hypertension   ?Risk factors: no smoking   ? ?HPI: A 61 year old patient with a history of treated diabetes, hypertension, hypercholesterolemia and obesity presents for evaluation of chest pain. Initial onset of pain was more than 6 hours ago. The patient's chest pain is sharp and is not worse with exertion. The patient's chest pain is middle- or left-sided, is not well-localized, is not described as heaviness/pressure/tightness and does radiate to the  arms/jaw/neck. The patient does not complain of nausea and denies diaphoresis. The patient has a family history of coronary artery disease in a first-degree relative with onset less than age 22. The patient has no history of stroke, has no history of peripheral artery disease and has not smoked in the past 90 days.  ? ?Home Medications ?Prior to Admission medications   ?Medication Sig Start Date End Date Taking? Authorizing Provider  ?allopurinol (ZYLOPRIM) 100 MG tablet Take 200 mg by mouth daily.     [provider]  ?amiodarone (PACERONE) 200 MG tablet Take 0.5 tablets (100 mg total) by mouth daily. 02/18/20   Darrick Grinder D, NP  ?aspirin 81 MG chewable tablet Chew 81 mg by mouth daily.    [provider]  ?atorvastatin (LIPITOR) 80 MG tablet Take 1 tablet (80 mg total) by mouth at bedtime. 01/12/20   Consuelo Pandy, PA-C  ?Calcium Carbonate-Vitamin D 600-400 MG-UNIT tablet Take 1 tablet by mouth 2 (two) times daily. 07/14/12   [provider]  ?carvedilol (COREG) 25 MG tablet Take 1 tablet (25 mg total) by mouth 2 (two) times daily with a meal. 02/19/20   Bensimhon, Shaune Pascal, MD  ?colchicine 0.6 MG tablet Take 0.6 mg by mouth 2 (two) times daily as needed (Gout). 12/09/18   [provider]  ?cyclobenzaprine (FLEXERIL) 5 MG tablet Take 5 mg by mouth 3 (three) times daily as needed for muscle spasms.    [provider]  ?diclofenac  sodium (VOLTAREN) 1 % GEL Apply 2 g topically 4 (four) times daily. 03/27/18   Caccavale, Sophia, PA-C  ?Dulaglutide 1.5 MG/0.5ML SOPN Inject 1.5 mg into the skin every Sunday. 12/20/19   [provider]  ?empagliflozin (JARDIANCE) 10 MG TABS tablet Take 1 tablet (10 mg total) by mouth daily before breakfast. 10/11/20   Bensimhon, Shaune Pascal, MD  ?ferrous sulfate 325 (65 FE) MG tablet Take 325 mg by mouth 3 (three) times daily.     [provider]  ?hydrALAZINE (APRESOLINE) 100 MG tablet Take 1 tablet (100 mg total) by mouth 3  (three) times daily. 02/20/21   Bensimhon, Shaune Pascal, MD  ?isosorbide mononitrate (IMDUR) 60 MG 24 hr tablet Take 1 tablet (60 mg total) by mouth daily. 02/16/17   Mariel Aloe, MD  ?loratadine (CLARITIN) 10 MG tablet Take 10 mg by mouth daily.    [provider]  ?Magnesium Oxide 420 MG TABS Take 840 mg by mouth daily.    [provider]  ?Multiple Vitamin (MULTIVITAMIN) tablet Take 1 tablet by mouth daily.    [provider]  ?omeprazole (PRILOSEC) 40 MG capsule Take 40 mg by mouth daily.    [provider]  ?potassium chloride (KLOR-CON) 20 MEQ packet Patient takes 2 tablets by mouth twice a day.    [provider]  ?rOPINIRole (REQUIP) 0.5 MG tablet Take 0.5 mg by mouth at bedtime.     [provider]  ?sertraline (ZOLOFT) 100 MG tablet Patient takes 2 tablet at bedtime.    [provider]  ?torsemide (DEMADEX) 20 MG tablet Take 2 tablets (40 mg total) by mouth 2 (two) times daily. 11/15/21   Rafael Bihari, [name]  ?   ? ?Allergies    ?Codeine, Gabapentin, Haldol [haloperidol lactate], Levofloxacin, Lisinopril, Losartan, Morphine and related, and Penicillins   ? ?Review of Systems   ?Review of Systems  ?Constitutional:  Negative for diaphoresis, fatigue and fever.  ?HENT:  Negative for sore throat.   ?Respiratory:  Positive for shortness of breath. Negative for cough.   ?Cardiovascular:  Positive for chest pain.  ?Gastrointestinal:  Negative for abdominal pain, nausea and vomiting.  ?Genitourinary:  Negative for dysuria.  ?Musculoskeletal:  Positive for neck pain.  ?Skin:  Negative for rash.  ?Neurological:  Negative for headaches.  ? ?Physical Exam ?Updated Vital Signs ?BP 137/71   Pulse 73   Temp 98.3 ?F (36.8 ?C) (Oral)   Resp 17   Ht 5\' 3"  (1.6 m)   Wt 77.1 kg   SpO2 97%   BMI 30.11 kg/m?  ?Physical Exam ?Vitals and nursing note reviewed.  ?Constitutional:   ?   General: She is not in acute distress. ?   Appearance: She is well-developed.   ?HENT:  ?   Head: Normocephalic and atraumatic.  ?Eyes:  ?   Conjunctiva/sclera: Conjunctivae normal.  ?Cardiovascular:  ?   Rate and Rhythm: Normal rate and regular rhythm.  ?Pulmonary:  ?   Effort: Pulmonary effort is normal. No respiratory distress.  ?   Breath sounds: Normal breath sounds.  ?Abdominal:  ?   Palpations: Abdomen is soft.  ?   Tenderness: There is no abdominal tenderness.  ?Musculoskeletal:     ?   General: No swelling. Normal range of motion.  ?   Cervical back: Neck supple.  ?   Right lower leg: No tenderness. No edema.  ?   Left lower leg: No tenderness. No edema.  ?Skin: ?  General: Skin is warm and dry.  ?   Capillary Refill: Capillary refill takes less than 2 seconds.  ?Neurological:  ?   General: No focal deficit present.  ?   Mental Status: She is alert.  ? ? ?ED Results / Procedures / Treatments   ?Labs ?(all labs ordered are listed, but only abnormal results are displayed) ?Labs Reviewed  ?BASIC METABOLIC PANEL - Abnormal; Notable for the following components:  ?    Result Value  ? Glucose, Bld 117 (*)   ? Creatinine, Ser 1.67 (*)   ? GFR, Estimated 34 (*)   ? All other components within normal limits  ?TROPONIN I (HIGH SENSITIVITY) - Abnormal; Notable for the following components:  ? Troponin I (High Sensitivity) 37 (*)   ? All other components within normal limits  ?TROPONIN I (HIGH SENSITIVITY) - Abnormal; Notable for the following components:  ? Troponin I (High Sensitivity) 35 (*)   ? All other components within normal limits  ?CBC  ?BRAIN NATRIURETIC PEPTIDE  ? ? ?EKG ?EKG Interpretation ? ?Date/Time:  Saturday November 18 2021 13:41:36 EDT ?Ventricular Rate:  68 ?PR Interval:  141 ?QRS Duration: 146 ?QT Interval:  478 ?QTC Calculation: 509 ?R Axis:   -46 ?Text Interpretation: Atrial sensed ventricular paced rhythm No significant change since prior 3/23 Confirmed by Aletta Edouard [phone]) on 11/18/2021 1:46:07 PM ? ?Radiology ?DG Chest Port 1 View ? ?Result Date: 11/18/2021 ?CLINICAL  DATA:  Chest pain. EXAM: PORTABLE CHEST 1 VIEW COMPARISON:  Jan 05, 2020 FINDINGS: Stable AICD device. The cardiomediastinal silhouette is unchanged. No pneumothorax. The cardiomediastinal silhouette is stable. No acute

## 2021-11-18 NOTE — ED Notes (Signed)
ED Provider at bedside. 

## 2021-11-18 NOTE — ED Provider Notes (Signed)
Patient received in handoff.  Chest pain pending delta troponin.  Delta troponin stable.  Patient given Tylenol and a lidocaine patch as her pain is reproducible.  Patient then discharged with cardiology follow-up. ?  ?Teressa Lower, MD ?11/18/21 1738 ? ?

## 2021-11-18 NOTE — ED Notes (Signed)
Attempted IV to LAC, tol well, blood obtained for labs  ?

## 2021-11-18 NOTE — ED Triage Notes (Signed)
Pt arrives pov, steady gait c/o left side CP  under left breast and left arm pain. Also reports shob. Denies n/v ?

## 2021-11-23 ENCOUNTER — Other Ambulatory Visit: Payer: Self-pay | Admitting: Cardiology

## 2021-11-24 ENCOUNTER — Other Ambulatory Visit (HOSPITAL_COMMUNITY): Payer: Self-pay | Admitting: *Deleted

## 2021-11-24 MED ORDER — TORSEMIDE 20 MG PO TABS: 40.0000 mg | ORAL_TABLET | Freq: Two times a day (BID) | ORAL | 0 refills | Status: DC

## 2021-11-27 ENCOUNTER — Encounter (HOSPITAL_COMMUNITY): Payer: Self-pay | Admitting: Internal Medicine

## 2021-11-27 ENCOUNTER — Other Ambulatory Visit (HOSPITAL_COMMUNITY): Payer: Self-pay | Admitting: *Deleted

## 2021-11-27 ENCOUNTER — Ambulatory Visit (INDEPENDENT_AMBULATORY_CARE_PROVIDER_SITE_OTHER): Payer: Medicare Other

## 2021-11-27 DIAGNOSIS — I5022 Chronic systolic (congestive) heart failure: Secondary | ICD-10-CM | POA: Diagnosis not present

## 2021-11-27 DIAGNOSIS — Z95810 Presence of automatic (implantable) cardiac defibrillator: Secondary | ICD-10-CM

## 2021-11-27 MED ORDER — POTASSIUM CHLORIDE CRYS ER 20 MEQ PO TBCR: 40.0000 meq | EXTENDED_RELEASE_TABLET | Freq: Two times a day (BID) | ORAL | 0 refills | Status: DC

## 2021-11-27 NOTE — Progress Notes (Signed)
EPIC Encounter for ICM Monitoring ? ?Patient Name: Michelle Andrade is a 61 y.o. female ?Date: 11/27/2021 ?Primary Care Physican: Zachery Dauer, MD ?Primary Cardiologist: Bensimhon ?Electrophysiologist: Camnitz ?Bi-V Pacing: >99%          ?10/25/2021 Weight: 174 lbs  ?                                                         ?  ?Spoke with patient and heart failure questions reviewed.  Pt has been feeling dizzy for the last few days. She reports drinking a lot of fluid and may be drinking >64 oz daily.  She will discuss dizziness at 3/30 HF clinic.  ?  ?CorVue thoracic impedance suggesting possible fluid accumulation starting 3/24 but trending back close to baseline.  Impedance suggesting possible fluid accumulation for 23 days within the last month. ?  ?Prescribed:  ?Torsemide 20 mg take 2 tablets (40 mg total) by mouth twice a day. ?Potassium 20 mEq take 2 tablet (40 mEq total) by mouth twice a day. ?  ?Labs: ?11/18/2021 Creatinine 1.67, BUN 15, Potassium 3.7, Sodium 137, GFR 34 ?11/14/2021 Creatinine 1.58, BUN 14, Potassium 3.8, Sodium 137, GFR 37  ?A complete set of results can be found in Results Review. ?  ?Recommendations:  Recommendation to limit salt intake to 2000 mg daily and fluid intake to 64 oz daily.  Encouraged to call if experiencing any fluid symptoms.  ?  ?Follow-up plan: ICM clinic phone appointment on 01/01/2022 (fluid level recheck in office 3/30).   91 day device clinic remote transmission 12/08/2021.   ?  ?EP/Cardiology Office Visits:   11/30/2021 with HF Clinic PA/NP.   01/22/2022 with Dr Curt Bears. ?  ?Copy of ICM check sent to Dr. Curt Bears.     ? ?3 month ICM trend: 11/27/2021. ? ? ? ?12-14 Month ICM trend:  ? ? ? ?[name], RN ?11/27/2021 ?7:42 AM ? ?

## 2021-11-30 ENCOUNTER — Encounter (HOSPITAL_COMMUNITY): Payer: Medicare Other

## 2021-12-01 NOTE — Progress Notes (Signed)
?Advanced Heart Failure Clinic Note  ? ?HF Cardiologist: Dr. Haroldine Laws  ?Nephrology: Dr Valaria [facility].  ? ?Reason for Visit: f/u Chronic Systolic Heart Failure ? ?HPI:  ?[name] is a 61 y.o. female with a past medical history of HTN, chronic systolic HF due to NICM (normal cors on cath 2014) s/p CRT-D ([employer]), atrial tachycardia (2017), CKD stage III, DM, OSA w/CPAP. 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 ?  ?Admitted 4/18 with ADHF at [facility]. Echo that admission with EF 25-30%. ?  ?Baseline SCr 1.4-1.6 when reviewing her records from [facility]. Her last office visit with her Cardiologist Dr. Otho Perl was in January 2018 . He noted angioedema with ACE-I and cough with ARB.  ?  ?Echo 3/20 LVEF 25-30%  ? ?Admitted 5/21 with atypical CP. HS troponin 33>34. Had NST that was interpreted by reading provider to have several areas of ischemia/infarct concerning for multivessel CAD. She was gently hydrated prior given CKD. LHC showed moderate, nonobstructive CAD w/ 50-60% RCA, flow wire 1.0 (non-flow limiting). Medical therapy recommended. ? ?ICD changed in 1/22. ? ?Follow up 6/22, weight up 20 lbs but volume ok. Stable NYHA II. Hydralazine increased for better BP control. ? ?She was seen 11/14/21 with atypical CP x 1 month. Fluid up and torsemide increased to 40 bid. Evaluated in ED 11/18/21 with CP. Troponin neg. Pain reproducible and given acetaminophen and lidocaine patch.  ? ?Today she returns for HF follow up. Overall feeling fine. No further chest pain. Main issue is dizziness, seems worse lately. No falls. She gets SOB if she pushes herself or walks fast. Denies abnormal bleeding, palpitations, edema, or PND/Orthopnea. Appetite ok. No fever or chills. Weight at home 174 pounds. Taking all medications. Planning on ulnar release surgery. ? ?Review of systems complete and found to be negative unless listed in HPI.   ? ?Past Medical History:  ?Diagnosis Date  ? Arthritis  02/08/2017  ? Biventricular ICD (implantable cardioverter-defibrillator) in place 01/25/2016  ? CAD (coronary artery disease) 09/01/2013  ? CHF (congestive heart failure) ([place]) 06/07/2015  ? CKD (chronic kidney disease), stage III ([place]) 12/09/2013  ? Depression 07/27/2013  ? Diabetes mellitus without complication ([place]) 57/84/6962  ? GERD (gastroesophageal reflux disease) 07/27/2013  ? Gout 12/29/2013  ? Heart valve disorder 06/03/2013  ? High cholesterol 11/10/2013  ? Hypertension 11/10/2013  ? IBS (irritable bowel syndrome) 09/27/2014  ? Myocardial infarct [facility])   ? Sleep apnea 10/13/2013  ? Vascular disorder 01/12/2014  ? ?Current Outpatient Medications  ?Medication Sig Dispense Refill  ? allopurinol (ZYLOPRIM) 100 MG tablet Take 200 mg by mouth daily.     ? amiodarone (PACERONE) 200 MG tablet Take 0.5 tablets (100 mg total) by mouth daily. 90 tablet 3  ? aspirin 81 MG chewable tablet Chew 81 mg by mouth daily.    ? atorvastatin (LIPITOR) 80 MG tablet Take 1 tablet (80 mg total) by mouth at bedtime. 90 tablet 3  ? Calcium Carbonate-Vitamin D 600-400 MG-UNIT tablet Take 1 tablet by mouth 2 (two) times daily.    ? carvedilol (COREG) 25 MG tablet Take 1 tablet (25 mg total) by mouth 2 (two) times daily with a meal. 180 tablet 3  ? colchicine 0.6 MG tablet Take 0.6 mg by mouth 2 (two) times daily as needed (Gout).    ? cyclobenzaprine (FLEXERIL) 5 MG tablet Take 5 mg by mouth 3 (three) times daily as needed for muscle spasms.    ?  diclofenac sodium (VOLTAREN) 1 % GEL Apply 2 g topically 4 (four) times daily. 100 g 0  ? Dulaglutide 1.5 MG/0.5ML SOPN Inject 1.5 mg into the skin every Sunday.    ? empagliflozin (JARDIANCE) 10 MG TABS tablet Take 1 tablet (10 mg total) by mouth daily before breakfast. 90 tablet 3  ? ferrous sulfate 325 (65 FE) MG tablet Take 325 mg by mouth 3 (three) times daily.     ? hydrALAZINE (APRESOLINE) 100 MG tablet Take 1 tablet (100 mg total) by mouth 3 (three) times daily. 90 tablet 6  ?  isosorbide mononitrate (IMDUR) 60 MG 24 hr tablet Take 1 tablet (60 mg total) by mouth daily. 30 tablet 0  ? loratadine (CLARITIN) 10 MG tablet Take 10 mg by mouth daily.    ? Magnesium Oxide 420 MG TABS Take 840 mg by mouth daily.    ? Multiple Vitamin (MULTIVITAMIN) tablet Take 1 tablet by mouth daily.    ? omeprazole (PRILOSEC) 40 MG capsule Take 40 mg by mouth daily.    ? potassium chloride SA (KLOR-CON M) 20 MEQ tablet Take 2 tablets (40 mEq total) by mouth 2 (two) times daily. 120 tablet 0  ? rOPINIRole (REQUIP) 0.5 MG tablet Take 0.5 mg by mouth at bedtime.     ? sertraline (ZOLOFT) 100 MG tablet Patient takes 2 tablet at bedtime.    ? torsemide (DEMADEX) 20 MG tablet Take 2 tablets (40 mg total) by mouth 2 (two) times daily. 120 tablet 0  ? ?No current facility-administered medications for this encounter.  ? ?Allergies  ?Allergen Reactions  ? Codeine Itching  ? Gabapentin Itching  ? Haldol [Haloperidol Lactate] Other (See Comments)  ?  Dizziness, hallucinations  ? Levofloxacin   ?  Other reaction(s): Other (See Comments) ?Projectile vomiting   ? Lisinopril Itching and Swelling  ? Losartan Itching  ? Morphine And Related Nausea And Vomiting  ? Penicillins Nausea And Vomiting  ?  "Vomiting, upset stomach, Headache" ?Has patient had a PCN reaction causing immediate rash, facial/tongue/throat swelling, SOB or lightheadedness with hypotension: No ?Has patient had a PCN reaction causing severe rash involving mucus membranes or skin necrosis: No ?Has patient had a PCN reaction that required hospitalization: No ?Has patient had a PCN reaction occurring within the last 10 years: No ?If all of the above answers are "NO", then may proceed with Cephalosporin use. ?  ? ?Social History  ? ?Socioeconomic History  ? Marital status: Single  ?  Spouse name: Not on file  ? Number of children: Not on file  ? Years of education: Not on file  ? Highest education level: Not on file  ?Occupational History  ? Not on file  ?Tobacco  Use  ? Smoking status: Never  ? Smokeless tobacco: Never  ?Vaping Use  ? Vaping Use: Never used  ?Substance and Sexual Activity  ? Alcohol use: No  ? Drug use: No  ? Sexual activity: Yes  ?Other Topics Concern  ? Not on file  ?Social History Narrative  ? Not on file  ? ?Social Determinants of Health  ? ?Financial Resource Strain: Not on file  ?Food Insecurity: Not on file  ?Transportation Needs: Not on file  ?Physical Activity: Not on file  ?Stress: Not on file  ?Social Connections: Not on file  ?Intimate Partner Violence: Not on file  ? ?Family History  ?Problem Relation Age of Onset  ? Hypertension Mother   ? ?BP 114/66   Pulse 80  Wt 78.9 kg   SpO2 97%   BMI 30.82 kg/m?  ? ?Wt Readings from Last 3 Encounters:  ?12/04/21 78.9 kg  ?11/18/21 77.1 kg  ?11/14/21 78.9 kg  ?  ?PHYSICAL EXAM: ?General:  NAD. No resp difficulty ?HEENT: Normal ?Neck: Supple. No JVD. Carotids 2+ bilat; no bruits. No lymphadenopathy or thryomegaly appreciated. ?Cor: PMI nondisplaced. Regular rate & rhythm. No rubs, gallops or murmurs. ?Lungs: Clear ?Abdomen: Soft, nontender, nondistended. No hepatosplenomegaly. No bruits or masses. Good bowel sounds. ?Extremities: No cyanosis, clubbing, rash, edema ?Neuro: Alert & oriented x 3, cranial nerves grossly intact. Moves all 4 extremities w/o difficulty. Affect pleasant. ? ?ICD interrogation (personally reviewed): Thoracic impedence stable, > 99% BiV pacing, no AF ? ?ASSESSMENT & PLAN: ?1. Chronic systolic CHF:  ?- NICM, normal cors in 2014, likely related to HTN vs. Noncompliance vs. Tachy- medicated with history of atrial tach. ?- s/p [place] CRT-D ?- Echo 02/2017 EF 20%, moderate MR, moderate TR. PA pressure 80 mm Hg.    ?- Echo 11/2018 EF 25-30% ?- Cath 5/21: Nonobstructive CAD RCA 60% lesion ?- Echo 12/21 EF 25-30% RV ok  ?- Stable NYHA II. Volume looks ok on exam and CoreVue. ?- No ARB/ ANRI due to angioedema/CKD.  ?- No spiro nor digoxin due to CKD.  ?- With dizziness, decrease hydralazine to  75 mg tid. ?- Continue torsemide 40 mg bid. ?- Continue Imdur 60 mg daily. ?- Continue carvedilol 25 mg bid.  ?- Continue Jardiance 10 mg daily. ?- Labs reviewed from Nephrology appt 11/24/21 and look ok; SCr 1.41

## 2021-12-04 ENCOUNTER — Ambulatory Visit (HOSPITAL_COMMUNITY)
Admission: RE | Admit: 2021-12-04 | Discharge: 2021-12-04 | Disposition: A | Discharge status: Home / Self Care | Payer: Medicare Other | Source: Ambulatory Visit | Attending: Family Medicine | Admitting: Family Medicine

## 2021-12-04 ENCOUNTER — Encounter (HOSPITAL_COMMUNITY): Payer: Self-pay

## 2021-12-04 VITALS — BP 114/66 | HR 80 | Wt 174.0 lb

## 2021-12-04 DIAGNOSIS — Z01810 Encounter for preprocedural cardiovascular examination: Secondary | ICD-10-CM | POA: Insufficient documentation

## 2021-12-04 DIAGNOSIS — Z79899 Other long term (current) drug therapy: Secondary | ICD-10-CM | POA: Diagnosis not present

## 2021-12-04 DIAGNOSIS — N1831 Chronic kidney disease, stage 3a: Secondary | ICD-10-CM | POA: Diagnosis not present

## 2021-12-04 DIAGNOSIS — Z7901 Long term (current) use of anticoagulants: Secondary | ICD-10-CM | POA: Insufficient documentation

## 2021-12-04 DIAGNOSIS — I471 Supraventricular tachycardia: Secondary | ICD-10-CM

## 2021-12-04 DIAGNOSIS — Z7984 Long term (current) use of oral hypoglycemic drugs: Secondary | ICD-10-CM | POA: Diagnosis not present

## 2021-12-04 DIAGNOSIS — N183 Chronic kidney disease, stage 3 unspecified: Secondary | ICD-10-CM

## 2021-12-04 DIAGNOSIS — I5022 Chronic systolic (congestive) heart failure: Secondary | ICD-10-CM | POA: Insufficient documentation

## 2021-12-04 DIAGNOSIS — E785 Hyperlipidemia, unspecified: Secondary | ICD-10-CM

## 2021-12-04 DIAGNOSIS — I10 Essential (primary) hypertension: Secondary | ICD-10-CM

## 2021-12-04 DIAGNOSIS — I2510 Atherosclerotic heart disease of native coronary artery without angina pectoris: Secondary | ICD-10-CM | POA: Insufficient documentation

## 2021-12-04 DIAGNOSIS — Z7982 Long term (current) use of aspirin: Secondary | ICD-10-CM | POA: Diagnosis not present

## 2021-12-04 DIAGNOSIS — I428 Other cardiomyopathies: Secondary | ICD-10-CM | POA: Insufficient documentation

## 2021-12-04 DIAGNOSIS — I130 Hypertensive heart and chronic kidney disease with heart failure and stage 1 through stage 4 chronic kidney disease, or unspecified chronic kidney disease: Secondary | ICD-10-CM | POA: Insufficient documentation

## 2021-12-04 DIAGNOSIS — I509 Heart failure, unspecified: Secondary | ICD-10-CM

## 2021-12-04 DIAGNOSIS — R0602 Shortness of breath: Secondary | ICD-10-CM | POA: Insufficient documentation

## 2021-12-04 DIAGNOSIS — E1122 Type 2 diabetes mellitus with diabetic chronic kidney disease: Secondary | ICD-10-CM | POA: Insufficient documentation

## 2021-12-04 DIAGNOSIS — R42 Dizziness and giddiness: Secondary | ICD-10-CM | POA: Insufficient documentation

## 2021-12-04 MED ORDER — HYDRALAZINE HCL 25 MG PO TABS
75.0000 mg | ORAL_TABLET | Freq: Three times a day (TID) | ORAL | 3 refills | Status: DC
Start: 2021-12-04 — End: 2022-09-26

## 2021-12-04 NOTE — Patient Instructions (Signed)
Medication Changes: ? ?Decrease Hydralazine to 75 mg Three times a day ? ? ?Lab Work: ? ?No lab work today ? ?Testing/Procedures: ? ?Your physician has requested that you have an echocardiogram. Echocardiography is a painless test that uses sound waves to create images of your heart. It provides your doctor with information about the size and shape of your heart and how well your heart?s chambers and valves are working. This procedure takes approximately one hour. There are no restrictions for this procedure. ? ? ?Referrals: ? ?none ? ?Special Instructions // Education: ? ?none ? ?Follow-Up in: 4 months with echocardiogram (August 2023) ** please call the office in June to arrange your follow up ** ? ?At the [facility], you and your health needs are our priority. We have a designated team specialized in the treatment of Heart Failure. This Care Team includes your primary Heart Failure Specialized Cardiologist (physician), Advanced Practice Providers (APPs- Physician Assistants and Nurse Practitioners), and Pharmacist who all work together to provide you with the care you need, when you need it.  ? ?You may see any of the following providers on your designated Care Team at your next follow up: ? ?Dr Glori Bickers ?Dr Loralie Champagne ?Darrick Grinder, NP ?Lyda Jester, PA ?Jessica Milford,NP ?Marlyce Huge, PA ?Audry Riles, PharmD ? ? ?Please be sure to bring in all your medications bottles to every appointment.  ? ?Need to Contact us: ? ?If you have any questions or concerns before your next appointment please send [place] a message through [place] or call our office at [phone].   ? ?TO LEAVE A MESSAGE FOR THE NURSE SELECT OPTION 2, PLEASE LEAVE A MESSAGE INCLUDING: ?YOUR NAME ?DATE OF BIRTH ?CALL BACK NUMBER ?REASON FOR CALL**this is important as we prioritize the call backs ? ?YOU WILL RECEIVE A CALL BACK THE SAME DAY AS LONG AS YOU CALL BEFORE 4:00 PM ? ? ?

## 2021-12-08 ENCOUNTER — Ambulatory Visit (INDEPENDENT_AMBULATORY_CARE_PROVIDER_SITE_OTHER): Payer: Medicare Other

## 2021-12-08 DIAGNOSIS — I429 Cardiomyopathy, unspecified: Secondary | ICD-10-CM

## 2021-12-08 LAB — CUP PACEART REMOTE DEVICE CHECK
Battery Remaining Longevity: 68 mo
Battery Remaining Percentage: 83 %
Battery Voltage: 2.98 V
Brady Statistic AP VP Percent: 1 %
Brady Statistic AP VS Percent: 1 %
Brady Statistic AS VP Percent: 99 %
Brady Statistic AS VS Percent: 1 %
Brady Statistic RA Percent Paced: 1 %
Date Time Interrogation Session: 20230407093739
HighPow Impedance: 69 Ohm
Implantable Lead Implant Date: 20150915
Implantable Lead Implant Date: 20150915
Implantable Lead Implant Date: 20150915
Implantable Lead Location: 753858
Implantable Lead Location: 753859
Implantable Lead Location: 753860
Implantable Pulse Generator Implant Date: 20220107
Lead Channel Impedance Value: 330 Ohm
Lead Channel Impedance Value: 390 Ohm
Lead Channel Impedance Value: 410 Ohm
Lead Channel Pacing Threshold Amplitude: 0.75 V
Lead Channel Pacing Threshold Amplitude: 1.25 V
Lead Channel Pacing Threshold Amplitude: 1.5 V
Lead Channel Pacing Threshold Pulse Width: 0.4 ms
Lead Channel Pacing Threshold Pulse Width: 0.4 ms
Lead Channel Pacing Threshold Pulse Width: 0.4 ms
Lead Channel Sensing Intrinsic Amplitude: 12 mV
Lead Channel Sensing Intrinsic Amplitude: 2.9 mV
Lead Channel Setting Pacing Amplitude: 1.75 V
Lead Channel Setting Pacing Amplitude: 2.25 V
Lead Channel Setting Pacing Amplitude: 2.5 V
Lead Channel Setting Pacing Pulse Width: 0.4 ms
Lead Channel Setting Pacing Pulse Width: 0.4 ms
Lead Channel Setting Sensing Sensitivity: 0.5 mV
Pulse Gen Serial Number: 111036830

## 2021-12-19 ENCOUNTER — Other Ambulatory Visit: Payer: Self-pay | Admitting: Internal Medicine

## 2021-12-22 NOTE — Progress Notes (Signed)
Remote ICD transmission.   

## 2021-12-29 ENCOUNTER — Other Ambulatory Visit (HOSPITAL_COMMUNITY): Payer: Self-pay

## 2021-12-29 MED ORDER — TORSEMIDE 20 MG PO TABS: 40.0000 mg | ORAL_TABLET | Freq: Two times a day (BID) | ORAL | 5 refills | Status: DC

## 2022-01-01 ENCOUNTER — Ambulatory Visit (INDEPENDENT_AMBULATORY_CARE_PROVIDER_SITE_OTHER): Payer: Medicare Other

## 2022-01-01 DIAGNOSIS — I5022 Chronic systolic (congestive) heart failure: Secondary | ICD-10-CM | POA: Diagnosis not present

## 2022-01-01 DIAGNOSIS — Z95810 Presence of automatic (implantable) cardiac defibrillator: Secondary | ICD-10-CM | POA: Diagnosis not present

## 2022-01-03 NOTE — Progress Notes (Signed)
EPIC Encounter for ICM Monitoring ? ?Patient Name: Michelle Andrade is a 61 y.o. female ?Date: 01/03/2022 ?Primary Care Physican: Zachery Dauer, MD ?Primary Cardiologist: Bensimhon ?Electrophysiologist: Camnitz ?Bi-V Pacing: >99%          ?10/25/2021 Weight: 174 lbs  ?01/03/2022 Weight: 172-174 lbs ?                                                         ?  ?Spoke with patient and heart failure questions reviewed.  Pt asymptomatic for fluid accumulation.  Reports feeling well at this time and voices no complaints. Dizziness has improved since hydralazine was reduced by HF clinic.  She is having shoulder surgery on 5/31.   ?  ?CorVue thoracic impedance suggesting normal fluid levels. ?  ?Prescribed:  ?Torsemide 20 mg take 2 tablets (40 mg total) by mouth twice a day. ?Potassium 20 mEq take 2 tablet (40 mEq total) by mouth twice a day. ?  ?Labs: ?11/18/2021 Creatinine 1.67, BUN 15, Potassium 3.7, Sodium 137, GFR 34 ?11/14/2021 Creatinine 1.58, BUN 14, Potassium 3.8, Sodium 137, GFR 37  ?A complete set of results can be found in Results Review. ?  ?Recommendations:   Encouraged to call if experiencing any fluid symptoms.  ?  ?Follow-up plan: ICM clinic phone appointment on 02/05/2022.   91 day device clinic remote transmission 03/09/2022.   ?  ?EP/Cardiology Office Visits:   01/22/2022 with Dr Curt Bears. ?  ?Copy of ICM check sent to Dr. Curt Bears.   ? ?3 month ICM trend: 01/01/2022. ? ? ? ?[name], RN ?01/03/2022 ?10:38 AM ? ?

## 2022-01-10 IMAGING — DX DG CHEST 1V PORT
1 series · 1 of 1 positions shown · non-contrast
Comparison: 10/06/2018

CLINICAL DATA: Shortness of breath, nausea, palpitations

EXAM:
PORTABLE CHEST 1 VIEW

[chest ap]
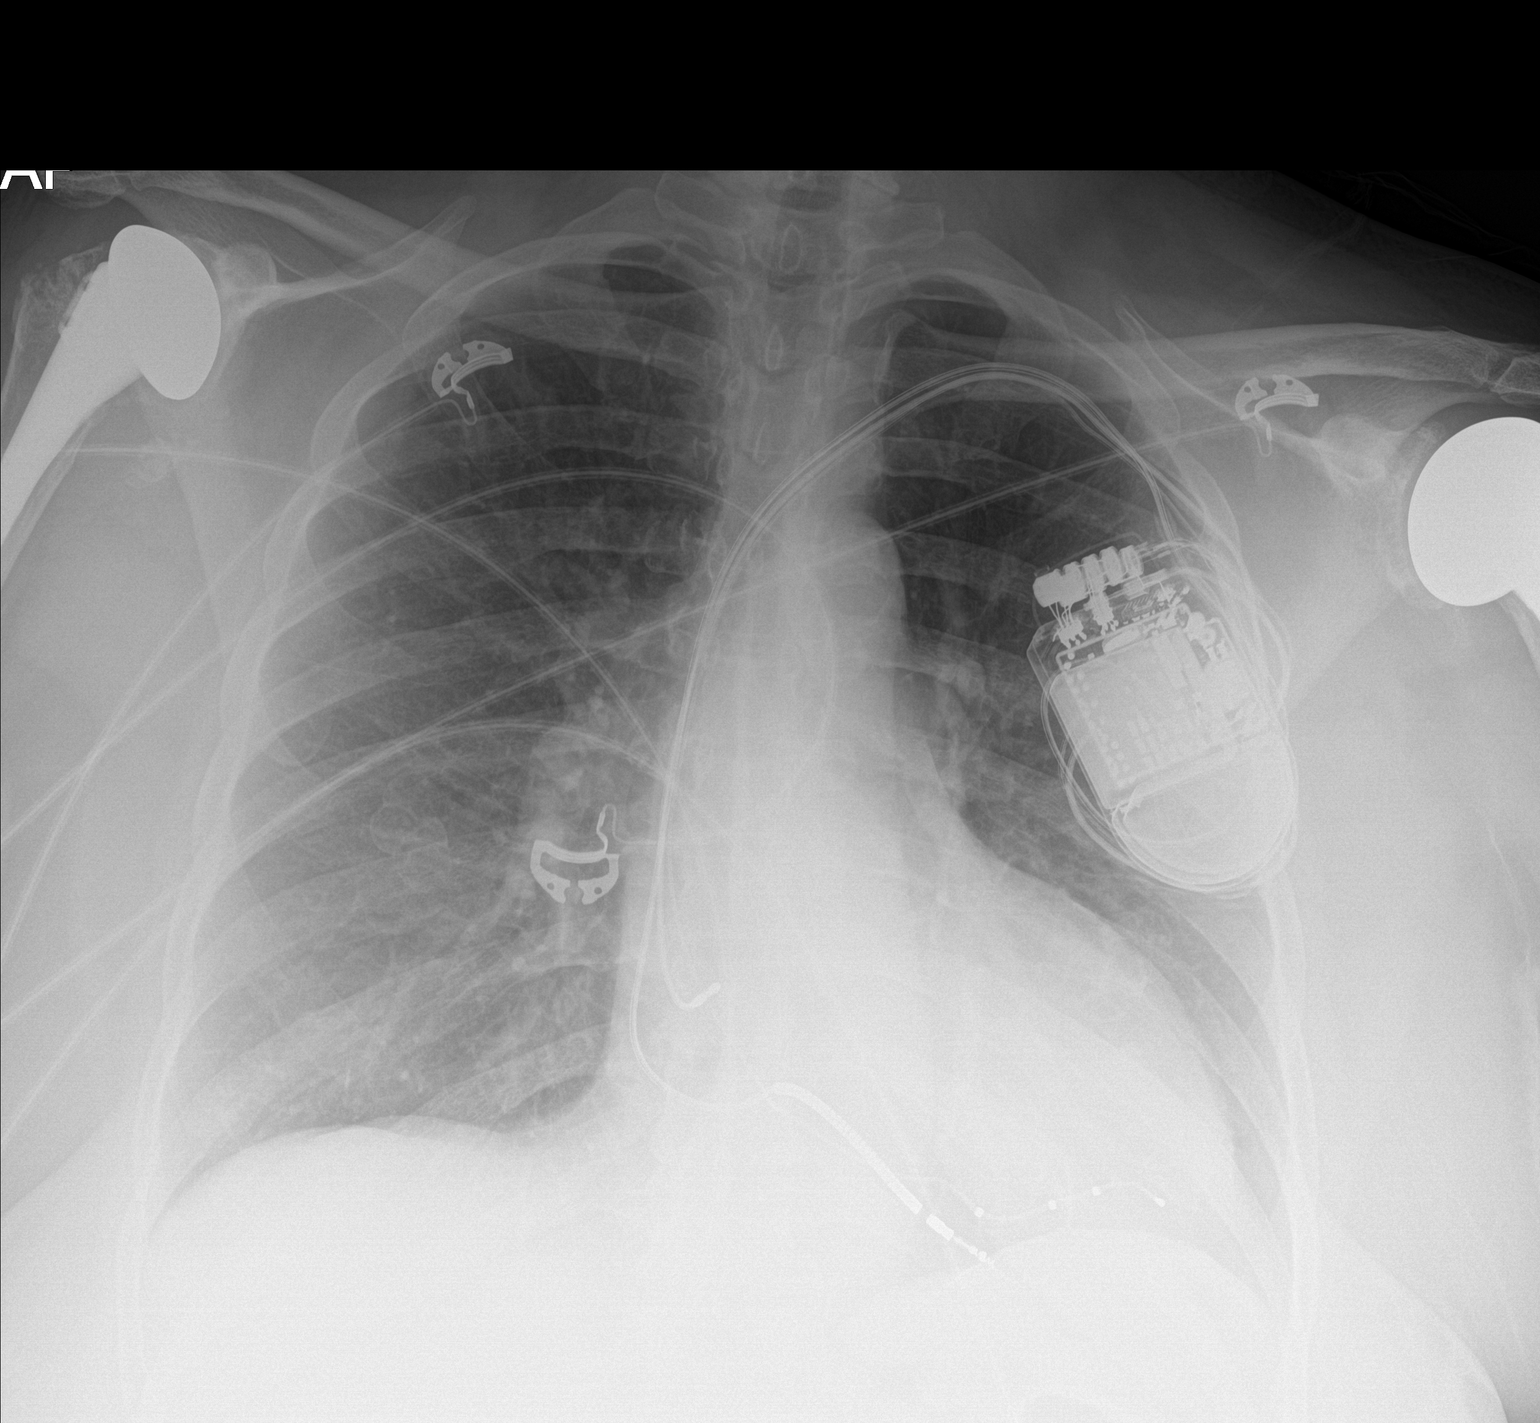

[1 of 1 positions shown; findings below may reference images not displayed]

FINDINGS: Cardiomegaly with left chest multi lead pacer defibrillator. Both
lungs are clear. Status post bilateral shoulder arthroplasty.
IMPRESSION: Cardiomegaly without acute abnormality of the lungs.

## 2022-01-22 ENCOUNTER — Ambulatory Visit (INDEPENDENT_AMBULATORY_CARE_PROVIDER_SITE_OTHER): Payer: Medicare Other | Admitting: Cardiology

## 2022-01-22 ENCOUNTER — Encounter: Payer: Self-pay | Admitting: Cardiology

## 2022-01-22 VITALS — BP 132/70 | HR 74 | Ht 63.0 in | Wt 171.0 lb

## 2022-01-22 DIAGNOSIS — I5022 Chronic systolic (congestive) heart failure: Secondary | ICD-10-CM | POA: Diagnosis not present

## 2022-01-22 DIAGNOSIS — I2510 Atherosclerotic heart disease of native coronary artery without angina pectoris: Secondary | ICD-10-CM

## 2022-01-22 NOTE — Patient Instructions (Signed)
Medication Instructions:  Your physician recommends that you continue on your current medications as directed. Please refer to the Current Medication list given to you today.  *If you need a refill on your cardiac medications before your next appointment, please call your pharmacy*   Lab Work: None ordered   Testing/Procedures: None ordered   Follow-Up: At [facility], you and your health needs are our priority.  As part of our continuing mission to provide you with exceptional heart care, we have created designated Provider Care Teams.  These Care Teams include your primary Cardiologist (physician) and Advanced Practice Providers (APPs -  Physician Assistants and Nurse Practitioners) who all work together to provide you with the care you need, when you need it.  Remote monitoring is used to monitor your Pacemaker or ICD from home. This monitoring reduces the number of office visits required to check your device to one time per year. It allows [place] to keep an eye on the functioning of your device to ensure it is working properly. You are scheduled for a device check from home on 03/09/2022. You may send your transmission at any time that day. If you have a wireless device, the transmission will be sent automatically. After your physician reviews your transmission, you will receive a postcard with your next transmission date.  Your next appointment:   1 year(s)  The format for your next appointment:   In Person  Provider:   Allegra Lai, MD    Thank you for choosing [facility]!!   Trinidad Curet, RN [phone]    Other Instructions   Important Information About [place]

## 2022-01-22 NOTE — Progress Notes (Signed)
Electrophysiology Office Note   Date:  01/22/2022   ID:  Michelle Andrade, DOB [birth year], MRN [id number]  PCP:  Zachery Dauer, MD  Cardiologist:  Lee Primary Electrophysiologist:  Momina Hunton Meredith Leeds, MD    Chief Complaint: CHF   History of Present Illness: Michelle Andrade is a 61 y.o. female who is being seen today for the evaluation of CHF at the request of Tapp, Dannielle Burn, MD. Presenting today for electrophysiology evaluation.  She has a history significant for hypertension, chronic systolic heart failure secondary to nonischemic cardiomyopathy, atrial tachycardia, stage III CKD, hyperlipidemia, diabetes, OSA on CPAP, anemia.  She is status post [place] CRT-D.  Today, denies symptoms of palpitations, chest pain, shortness of breath, orthopnea, PND, lower extremity edema, claudication, dizziness, presyncope, syncope, bleeding, or neurologic sequela. The patient is tolerating medications without difficulties.  Since being seen she has done well.  She has no complaints at this time.  She is able to all of her daily activities without restriction.  She is planning to have surgery on her left elbow for what sounds like nerve impingement.  This is going to be [facility] at the [facility].  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   CAD (coronary artery disease) 09/01/2013   CHF (congestive heart failure) ([place]) 06/07/2015   CKD (chronic kidney disease), stage III ([place]) 12/09/2013   Depression 07/27/2013   Diabetes mellitus without complication ([place]) 78/93/8101   GERD (gastroesophageal reflux disease) 07/27/2013   Gout 12/29/2013   Heart valve disorder 06/03/2013   High cholesterol 11/10/2013   Hypertension 11/10/2013   IBS (irritable bowel syndrome) 09/27/2014   Myocardial infarct [facility])    Sleep apnea 10/13/2013   Vascular disorder 01/12/2014   Past Surgical History:   Procedure Laterality Date   ABDOMINAL HYSTERECTOMY     BIV ICD GENERATOR CHANGEOUT N/A 09/09/2020   Procedure: BIV ICD GENERATOR CHANGEOUT;  Surgeon: Constance Haw, MD;  Location: [facility];  Service: Cardiovascular;  Laterality: N/A;   CARDIAC DEFIBRILLATOR PLACEMENT     CARPAL TUNNEL RELEASE     HEEL SPUR EXCISION     INTRAVASCULAR PRESSURE WIRE/FFR STUDY N/A 01/08/2020   Procedure: INTRAVASCULAR PRESSURE WIRE/FFR STUDY;  Surgeon: Jettie Booze, MD;  Location: [place] [facility];  Service: Cardiovascular;  Laterality: N/A;   KNEE SURGERY     LEFT HEART CATH AND CORONARY ANGIOGRAPHY N/A 01/08/2020   Procedure: LEFT HEART CATH AND CORONARY ANGIOGRAPHY;  Surgeon: Jolaine Artist, MD;  Location: [place];  Service: Cardiovascular;  Laterality: N/A;   PACEMAKER GENERATOR CHANGE     RIGHT HEART CATH N/A 02/13/2017   Procedure: Right Heart Cath;  Surgeon: Jolaine Artist, MD;  Location: [place] [facility];  Service: Cardiovascular;  Laterality: N/A;   SHOULDER SURGERY       Current Outpatient Medications  Medication Sig Dispense Refill   allopurinol (ZYLOPRIM) 100 MG tablet Take 200 mg by mouth daily.      amiodarone (PACERONE) 200 MG tablet Take 0.5 tablets (100 mg total) by mouth daily. 90 tablet 3   aspirin 81 MG chewable tablet Chew 81 mg by mouth daily.     atorvastatin (LIPITOR) 80 MG tablet Take 1 tablet (80 mg total) by mouth at bedtime. 90 tablet 3   Calcium Carbonate-Vitamin D 600-400 MG-UNIT tablet Take 1 tablet by mouth 2 (two) times daily.  carvedilol (COREG) 25 MG tablet Take 1 tablet (25 mg total) by mouth 2 (two) times daily with a meal. 180 tablet 3   colchicine 0.6 MG tablet Take 0.6 mg by mouth 2 (two) times daily as needed (Gout).     cyclobenzaprine (FLEXERIL) 5 MG tablet Take 5 mg by mouth 3 (three) times daily as needed for muscle spasms.     diclofenac sodium (VOLTAREN) 1 % GEL Apply 2 g topically 4 (four) times daily. 100 g 0    Dulaglutide 1.5 MG/0.5ML SOPN Inject 1.5 mg into the skin every Sunday.     empagliflozin (JARDIANCE) 10 MG TABS tablet Take 1 tablet (10 mg total) by mouth daily before breakfast. 90 tablet 3   ferrous sulfate 325 (65 FE) MG tablet Take 325 mg by mouth 3 (three) times daily.      hydrALAZINE (APRESOLINE) 25 MG tablet Take 3 tablets (75 mg total) by mouth 3 (three) times daily. (Patient taking differently: Take 12.5 mg by mouth 3 (three) times daily.) 500 tablet 3   isosorbide mononitrate (IMDUR) 60 MG 24 hr tablet Take 1 tablet (60 mg total) by mouth daily. 30 tablet 0   loratadine (CLARITIN) 10 MG tablet Take 10 mg by mouth daily.     Magnesium Oxide 420 MG TABS Take 840 mg by mouth daily.     Multiple Vitamin (MULTIVITAMIN) tablet Take 1 tablet by mouth daily.     omeprazole (PRILOSEC) 40 MG capsule Take 40 mg by mouth daily.     potassium chloride SA (KLOR-CON M) 20 MEQ tablet Take 2 tablets (40 mEq total) by mouth 2 (two) times daily. 120 tablet 0   rOPINIRole (REQUIP) 0.5 MG tablet Take 0.5 mg by mouth at bedtime.      sertraline (ZOLOFT) 100 MG tablet Patient takes 2 tablet at bedtime.     torsemide (DEMADEX) 20 MG tablet Take 2 tablets (40 mg total) by mouth 2 (two) times daily. 112 tablet 5   No current facility-administered medications for this visit.    Allergies:   Codeine, Gabapentin, Haldol [haloperidol lactate], Levofloxacin, Lisinopril, Losartan, Morphine and related, and Penicillins   Social History:  The patient  reports that she has never smoked. She has never used smokeless tobacco. She reports that she does not drink alcohol and does not use drugs.   Family History:  The patient's family history includes Hypertension in her mother.   ROS:  Please see the history of present illness.   Otherwise, review of systems is positive for none.   All other systems are reviewed and negative.   PHYSICAL EXAM: VS:  BP 132/70   Pulse 74   Ht 5\' 3"  (1.6 m)   Wt 171 lb (77.6 kg)    SpO2 96%   BMI 30.29 kg/m  , BMI Body mass index is 30.29 kg/m. GEN: Well nourished, well developed, in no acute distress  HEENT: normal  Neck: no JVD, carotid bruits, or masses Cardiac: RRR; no murmurs, rubs, or gallops,no edema  Respiratory:  clear to auscultation bilaterally, normal work of breathing GI: soft, nontender, nondistended, + BS MS: no deformity or atrophy  Skin: warm and dry, device site well healed Neuro:  Strength and sensation are intact Psych: euthymic mood, full affect  EKG:  EKG is not ordered today. Personal review of the ekg ordered 11/18/21 shows A sense, V paced  Personal review of the device interrogation today. Results in [place]: 02/20/2021: TSH 1.934 11/14/2021: ALT  30 11/18/2021: B Natriuretic Peptide 36.9; BUN 15; Creatinine, Ser 1.67; Hemoglobin 13.3; Platelets 275; Potassium 3.7; Sodium 137    Lipid Panel     Component Value Date/Time   CHOL 145 03/16/2020 1008   TRIG 71 03/16/2020 1008   HDL 59 03/16/2020 1008   CHOLHDL 2.5 03/16/2020 1008   VLDL 14 03/16/2020 1008   LDLCALC 72 03/16/2020 1008     Wt Readings from Last 3 Encounters:  01/22/22 171 lb (77.6 kg)  12/04/21 174 lb (78.9 kg)  11/18/21 170 lb (77.1 kg)      Other studies Reviewed: Additional studies/ records that were reviewed today include: TTE 11/05/18  Review of the above records today demonstrates:   1. The left ventricle has moderate-severely reduced systolic function,  with an ejection fraction of 30-35%. The cavity size was normal. Left  ventricular diastolic Doppler parameters are consistent with impaired  relaxation Elevated left ventricular  end-diastolic pressure Left ventricular diffuse hypokinesis.   2. The right ventricle has normal systolic function. The cavity was  normal. There is no increase in right ventricular wall thickness. Right  ventricular systolic pressure normal with an estimated pressure of 26.7  mmHg.   3. Left atrial size was  severely dilated.   4. The mitral valve is normal in structure. Mild thickening of the mitral  valve leaflet. Mild calcification of the anterior mitral valve leaflet.   5. The tricuspid valve is normal in structure.   6. The aortic valve is tricuspid Mild sclerosis of the aortic valve.   7. The pulmonic valve was normal in structure.    ASSESSMENT AND PLAN:  1.  Chronic systolic heart failure due to nonischemic cardiomyopathy: Currently on optimal medical therapy with Coreg, hydralazine, isosorbide, Aldactone.  Also takes metolazone and torsemide.  Status post [facility] Jude CRT-D.  Device functioning appropriately.  No changes at this time.  2.  Hypertension: Currently well controlled  3.  Atrial tachycardia: Currently on amiodarone.  High risk medication monitoring.  Remains in sinus rhythm.  4.  Preoperative evaluation: Plan to have surgery on her left elbow for possible nerve she can walk on flat ground without issue and can climb a flight of stairs if she did not have issues with arthritis in her knees.  She would be at intermediate risk for this intermediate risk procedure.  No further cardiac work-up is necessary.  If device interrogation does not have surgery her ICD.  Current medicines are reviewed at length with the patient today.   The patient does not have concerns regarding her medicines.  The following changes were made today: None  Labs/ tests ordered today include:  No orders of the defined types were placed in this encounter.    Disposition:   FU with Tonita Bills 12 months  Signed, Kelsey Edman Meredith Leeds, MD  01/22/2022 3:20 PM     [facility] [address] [place] [address] [phone] (office) [phone] (fax)

## 2022-01-25 ENCOUNTER — Telehealth (HOSPITAL_COMMUNITY): Payer: Self-pay | Admitting: Surgery

## 2022-01-25 ENCOUNTER — Encounter (HOSPITAL_COMMUNITY): Payer: Self-pay | Admitting: Internal Medicine

## 2022-01-25 NOTE — Telephone Encounter (Signed)
Patient came by clinic after pre-op appt and requested surgical clearance as the pre op center informed her that they had not yet received.  I have faxed copy of note providing clearance to the number she provided.

## 2022-01-29 ENCOUNTER — Encounter (HOSPITAL_COMMUNITY): Payer: Self-pay | Admitting: Internal Medicine

## 2022-01-30 ENCOUNTER — Telehealth (HOSPITAL_COMMUNITY): Payer: Self-pay | Admitting: Surgery

## 2022-01-30 NOTE — Telephone Encounter (Signed)
I received a message that patient was concerned with her Surgical clearance for upcoming scheduled surgery.  I faxed the office note including clearance to the number that she provided when she was last here.  I reassured patient that this had been completed.

## 2022-02-05 ENCOUNTER — Ambulatory Visit (INDEPENDENT_AMBULATORY_CARE_PROVIDER_SITE_OTHER): Payer: Medicare PPO

## 2022-02-05 DIAGNOSIS — Z95810 Presence of automatic (implantable) cardiac defibrillator: Secondary | ICD-10-CM | POA: Diagnosis not present

## 2022-02-05 DIAGNOSIS — I5022 Chronic systolic (congestive) heart failure: Secondary | ICD-10-CM

## 2022-02-05 NOTE — Progress Notes (Signed)
EPIC Encounter for ICM Monitoring  Patient Name: Michelle Andrade is a 61 y.o. female Date: 02/05/2022 Primary Care Physican: Zachery Dauer, MD Primary Cardiologist: [place] Electrophysiologist: [place] Bi-V Pacing: >99%          10/25/2021 Weight: 174 lbs  01/03/2022 Weight: 172-174 lbs 02/05/2022 Weight: 172 lbs                                                            Spoke with patient and heart failure questions reviewed.  Pt asymptomatic for fluid accumulation.  Weight stable.  She may be drinking more than 64 oz daily and not following low salt.     CorVue thoracic impedance suggesting possible fluid accumulation starting 5/29 which correlates with shoulder surgery.   Prescribed:  Torsemide 20 mg take 2 tablets (40 mg total) by mouth twice a day. Potassium 20 mEq take 2 tablet (40 mEq total) by mouth twice a day.   Labs: 11/24/2021 Creatinine 1.41, BUN 20, Potassium 4.9, Sodium 137, GFR 42 11/18/2021 Creatinine 1.67, BUN 15, Potassium 3.7, Sodium 137, GFR 34 11/14/2021 Creatinine 1.58, BUN 14, Potassium 3.8, Sodium 137, GFR 37  A complete set of results can be found in Results Review.   Recommendations:   Copy sent to Dr Haroldine Laws for review and recommendations.  Confirmed she is compliant with taking Torsemide and Potassium as prescribed.    Follow-up plan: ICM clinic phone appointment on 02/12/2022.   91 day device clinic remote transmission 03/09/2022.     EP/Cardiology Office Visits:   01/22/2022 with Dr Curt Bears.   Copy of ICM check sent to Dr. Curt Bears.     3 month ICM trend: 02/05/2022.    12-14 Month ICM trend:     [name], RN 02/05/2022 3:24 PM

## 2022-02-12 ENCOUNTER — Ambulatory Visit (INDEPENDENT_AMBULATORY_CARE_PROVIDER_SITE_OTHER): Payer: Medicare Other

## 2022-02-12 DIAGNOSIS — Z95810 Presence of automatic (implantable) cardiac defibrillator: Secondary | ICD-10-CM

## 2022-02-12 DIAGNOSIS — I5022 Chronic systolic (congestive) heart failure: Secondary | ICD-10-CM

## 2022-02-13 NOTE — Progress Notes (Signed)
EPIC Encounter for ICM Monitoring  Patient Name: Michelle Andrade is a 61 y.o. female Date: 02/13/2022 Primary Care Physican: Zachery Dauer, MD Primary Cardiologist: [place] Electrophysiologist: [place] Bi-V Pacing: >99%          10/25/2021 Weight: 174 lbs  01/03/2022 Weight: 172-174 lbs 02/05/2022 Weight: 172 lbs                                                          Spoke with patient and heart failure questions reviewed.  Pt asymptomatic for fluid accumulation.  Weight stable.     CorVue thoracic impedance suggesting fluid levels returned to normal.   Prescribed:  Torsemide 20 mg take 2 tablets (40 mg total) by mouth twice a day. Potassium 20 mEq take 2 tablet (40 mEq total) by mouth twice a day.   Labs: 11/24/2021 Creatinine 1.41, BUN 20, Potassium 4.9, Sodium 137, GFR 42 11/18/2021 Creatinine 1.67, BUN 15, Potassium 3.7, Sodium 137, GFR 34 11/14/2021 Creatinine 1.58, BUN 14, Potassium 3.8, Sodium 137, GFR 37  A complete set of results can be found in Results Review.   Recommendations:   No changes and encouraged to call if experiencing any fluid symptoms.   Follow-up plan: ICM clinic phone appointment on 03/12/2022.   91 day device clinic remote transmission 03/09/2022.     EP/Cardiology Office Visits:  Recall 04/05/2022 with Dr Haroldine Laws.   Recall 01/17/2023 with Dr Curt Bears.   Copy of ICM check sent to Dr. Curt Bears.    3 month ICM trend: 02/12/2022.    12-14 Month ICM trend:     [name], RN 02/13/2022 12:03 PM

## 2022-02-19 ENCOUNTER — Encounter (HOSPITAL_COMMUNITY): Payer: Self-pay | Admitting: Internal Medicine

## 2022-02-26 ENCOUNTER — Telehealth (HOSPITAL_COMMUNITY): Payer: Self-pay

## 2022-02-26 NOTE — Telephone Encounter (Signed)
Received a fax requesting medical records from [facility]. Records were successfully faxed to: [phone] ,which was the number provided.. Medical request form will be scanned into patients chart.

## 2022-03-09 ENCOUNTER — Ambulatory Visit (INDEPENDENT_AMBULATORY_CARE_PROVIDER_SITE_OTHER): Payer: Medicare PPO

## 2022-03-09 DIAGNOSIS — I429 Cardiomyopathy, unspecified: Secondary | ICD-10-CM

## 2022-03-10 LAB — CUP PACEART REMOTE DEVICE CHECK
Battery Remaining Longevity: 65 mo
Battery Remaining Percentage: 79 %
Battery Voltage: 2.96 V
Brady Statistic AP VP Percent: 1.5 %
Brady Statistic AP VS Percent: 1 %
Brady Statistic AS VP Percent: 98 %
Brady Statistic AS VS Percent: 1 %
Brady Statistic RA Percent Paced: 1.4 %
Date Time Interrogation Session: 20230707021029
HighPow Impedance: 68 Ohm
Implantable Lead Implant Date: 20150915
Implantable Lead Implant Date: 20150915
Implantable Lead Implant Date: 20150915
Implantable Lead Location: 753858
Implantable Lead Location: 753859
Implantable Lead Location: 753860
Implantable Pulse Generator Implant Date: 20220107
Lead Channel Impedance Value: 340 Ohm
Lead Channel Impedance Value: 390 Ohm
Lead Channel Impedance Value: 430 Ohm
Lead Channel Pacing Threshold Amplitude: 0.625 V
Lead Channel Pacing Threshold Amplitude: 1.25 V
Lead Channel Pacing Threshold Amplitude: 1.875 V
Lead Channel Pacing Threshold Pulse Width: 0.4 ms
Lead Channel Pacing Threshold Pulse Width: 0.4 ms
Lead Channel Pacing Threshold Pulse Width: 0.4 ms
Lead Channel Sensing Intrinsic Amplitude: 12 mV
Lead Channel Sensing Intrinsic Amplitude: 3.4 mV
Lead Channel Setting Pacing Amplitude: 1.625
Lead Channel Setting Pacing Amplitude: 2.25 V
Lead Channel Setting Pacing Amplitude: 2.875
Lead Channel Setting Pacing Pulse Width: 0.4 ms
Lead Channel Setting Pacing Pulse Width: 0.4 ms
Lead Channel Setting Sensing Sensitivity: 0.5 mV
Pulse Gen Serial Number: 111036830

## 2022-03-12 ENCOUNTER — Ambulatory Visit (INDEPENDENT_AMBULATORY_CARE_PROVIDER_SITE_OTHER): Payer: Medicare PPO

## 2022-03-12 DIAGNOSIS — I5022 Chronic systolic (congestive) heart failure: Secondary | ICD-10-CM | POA: Diagnosis not present

## 2022-03-12 DIAGNOSIS — Z95810 Presence of automatic (implantable) cardiac defibrillator: Secondary | ICD-10-CM | POA: Diagnosis not present

## 2022-03-13 ENCOUNTER — Telehealth: Payer: Self-pay

## 2022-03-13 NOTE — Progress Notes (Signed)
EPIC Encounter for ICM Monitoring  Patient Name: Michelle Andrade is a 61 y.o. female Date: 03/13/2022 Primary Care Physican: Zachery Dauer, MD Primary Cardiologist: [place] Electrophysiologist: Camn[place] Bi-V Pacing: >99%          10/25/2021 Weight: 174 lbs  01/03/2022 Weight: 172-174 lbs 02/05/2022 Weight: 172 lbs                                                          Attempted call to patient and unable to reach.  Transmission reviewed.      CorVue thoracic impedance suggesting normal fluid levels.   Prescribed:  Torsemide 20 mg take 2 tablets (40 mg total) by mouth twice a day. Potassium 20 mEq take 2 tablet (40 mEq total) by mouth twice a day.   Labs: 11/24/2021 Creatinine 1.41, BUN 20, Potassium 4.9, Sodium 137, GFR 42 11/18/2021 Creatinine 1.67, BUN 15, Potassium 3.7, Sodium 137, GFR 34 11/14/2021 Creatinine 1.58, BUN 14, Potassium 3.8, Sodium 137, GFR 37  A complete set of results can be found in Results Review.   Recommendations:  Unable to reach.     Follow-up plan: ICM clinic phone appointment on 04/16/2022.   91 day device clinic remote transmission 06/08/2022.     EP/Cardiology Office Visits:  Recall 04/05/2022 with Dr Haroldine Laws.   Recall 01/17/2023 with Dr Curt Bears.   Copy of ICM check sent to Dr. Curt Bears.    3 month ICM trend: 03/12/2022.    12-14 Month ICM trend:     [name], RN 03/13/2022 2:39 PM

## 2022-03-13 NOTE — Telephone Encounter (Signed)
Remote ICM transmission received.  Attempted call to patient regarding ICM remote transmission and no answer.  

## 2022-03-19 ENCOUNTER — Encounter (HOSPITAL_COMMUNITY): Payer: Self-pay | Admitting: Internal Medicine

## 2022-03-26 NOTE — Progress Notes (Signed)
Remote ICD transmission.   

## 2022-04-16 ENCOUNTER — Ambulatory Visit (INDEPENDENT_AMBULATORY_CARE_PROVIDER_SITE_OTHER): Payer: Medicare PPO

## 2022-04-16 DIAGNOSIS — Z95810 Presence of automatic (implantable) cardiac defibrillator: Secondary | ICD-10-CM | POA: Diagnosis not present

## 2022-04-16 DIAGNOSIS — I5022 Chronic systolic (congestive) heart failure: Secondary | ICD-10-CM | POA: Diagnosis not present

## 2022-04-18 ENCOUNTER — Telehealth: Payer: Self-pay

## 2022-04-18 NOTE — Telephone Encounter (Signed)
Remote ICM transmission received.  Attempted call to patient regarding ICM remote transmission and no answer.  

## 2022-04-18 NOTE — Progress Notes (Signed)
EPIC Encounter for ICM Monitoring  Patient Name: Michelle Andrade is a 61 y.o. female Date: 04/18/2022 Primary Care Physican: Zachery Dauer, MD Primary Cardiologist: [place] Electrophysiologist: Camn[place] Bi-V Pacing: >99%          10/25/2021 Weight: 174 lbs  01/03/2022 Weight: 172-174 lbs 02/05/2022 Weight: 172 lbs                                                          Attempted call to patient and unable to reach.   Transmission reviewed.    CorVue thoracic impedance suggesting normal fluid levels.   Prescribed:  Torsemide 20 mg take 2 tablets (40 mg total) by mouth twice a day. Potassium 20 mEq take 2 tablet (40 mEq total) by mouth twice a day.   Labs: 11/24/2021 Creatinine 1.41, BUN 20, Potassium 4.9, Sodium 137, GFR 42 11/18/2021 Creatinine 1.67, BUN 15, Potassium 3.7, Sodium 137, GFR 34 11/14/2021 Creatinine 1.58, BUN 14, Potassium 3.8, Sodium 137, GFR 37  A complete set of results can be found in Results Review.   Recommendations:  Unable to reach.     Follow-up plan: ICM clinic phone appointment on 05/21/2022.   91 day device clinic remote transmission 06/08/2022.     EP/Cardiology Office Visits:  Recall 04/05/2022 with Dr Haroldine Laws.   Recall 01/17/2023 with Dr Curt Bears.   Copy of ICM check sent to Dr. Curt Bears.     3 month ICM trend: 04/16/2022.    12-14 Month ICM trend:     [name], RN 04/18/2022 8:34 AM

## 2022-05-21 ENCOUNTER — Ambulatory Visit (INDEPENDENT_AMBULATORY_CARE_PROVIDER_SITE_OTHER): Payer: Medicare PPO

## 2022-05-21 DIAGNOSIS — I5022 Chronic systolic (congestive) heart failure: Secondary | ICD-10-CM

## 2022-05-21 DIAGNOSIS — Z95810 Presence of automatic (implantable) cardiac defibrillator: Secondary | ICD-10-CM | POA: Diagnosis not present

## 2022-05-23 NOTE — Progress Notes (Signed)
EPIC Encounter for ICM Monitoring  Patient Name: Michelle Andrade is a 61 y.o. female Date: 05/23/2022 Primary Care Physican: Zachery Dauer, MD Primary Cardiologist: [place] Electrophysiologist: Camn[place] Bi-V Pacing: >99%          10/25/2021 Weight: 174 lbs  01/03/2022 Weight: 172-174 lbs 02/05/2022 Weight: 172 lbs 05/23/2022 Weight: 172 lbs                                                          Spoke with patient and heart failure questions reviewed.  Pt asymptomatic for fluid accumulation.  Reports feeling well at this time and voices no complaints.    CorVue thoracic impedance suggesting normal fluid levels.   Prescribed:  Torsemide 20 mg take 2 tablets (40 mg total) by mouth twice a day. Potassium 20 mEq take 2 tablet (40 mEq total) by mouth twice a day.   Labs: 04/03/2022 Creatinine 1.53, BUN 34, Potassium 3.8, Sodium 137, GFR 38 11/24/2021 Creatinine 1.41, BUN 20, Potassium 4.9, Sodium 137, GFR 42 11/18/2021 Creatinine 1.67, BUN 15, Potassium 3.7, Sodium 137, GFR 34 11/14/2021 Creatinine 1.58, BUN 14, Potassium 3.8, Sodium 137, GFR 37  A complete set of results can be found in Results Review.   Recommendations:  No changes and encouraged to call if experiencing any fluid symptoms.   Follow-up plan: ICM clinic phone appointment on 06/25/2022.   91 day device clinic remote transmission 06/08/2022.     EP/Cardiology Office Visits:  Recall 04/05/2022 with Dr Haroldine Laws.   Recall 01/17/2023 with Dr Curt Bears.   Copy of ICM check sent to Dr. Curt Bears.   3 month ICM trend: 05/21/2022.    [name], RN 05/23/2022 4:37 PM

## 2022-06-08 ENCOUNTER — Ambulatory Visit (INDEPENDENT_AMBULATORY_CARE_PROVIDER_SITE_OTHER): Payer: Medicare PPO

## 2022-06-08 DIAGNOSIS — I429 Cardiomyopathy, unspecified: Secondary | ICD-10-CM

## 2022-06-08 LAB — CUP PACEART REMOTE DEVICE CHECK
Battery Remaining Longevity: 70 mo
Battery Remaining Percentage: 76 %
Battery Voltage: 2.98 V
Brady Statistic AP VP Percent: 2.3 %
Brady Statistic AP VS Percent: 1 %
Brady Statistic AS VP Percent: 98 %
Brady Statistic AS VS Percent: 1 %
Brady Statistic RA Percent Paced: 2.2 %
Date Time Interrogation Session: 20231006020028
HighPow Impedance: 70 Ohm
Implantable Lead Implant Date: 20150915
Implantable Lead Implant Date: 20150915
Implantable Lead Implant Date: 20150915
Implantable Lead Location: 753858
Implantable Lead Location: 753859
Implantable Lead Location: 753860
Implantable Pulse Generator Implant Date: 20220107
Lead Channel Impedance Value: 380 Ohm
Lead Channel Impedance Value: 430 Ohm
Lead Channel Impedance Value: 450 Ohm
Lead Channel Pacing Threshold Amplitude: 0.625 V
Lead Channel Pacing Threshold Amplitude: 1.25 V
Lead Channel Pacing Threshold Amplitude: 1.375 V
Lead Channel Pacing Threshold Pulse Width: 0.4 ms
Lead Channel Pacing Threshold Pulse Width: 0.4 ms
Lead Channel Pacing Threshold Pulse Width: 0.4 ms
Lead Channel Sensing Intrinsic Amplitude: 12 mV
Lead Channel Sensing Intrinsic Amplitude: 4 mV
Lead Channel Setting Pacing Amplitude: 1.625
Lead Channel Setting Pacing Amplitude: 2.25 V
Lead Channel Setting Pacing Amplitude: 2.375
Lead Channel Setting Pacing Pulse Width: 0.4 ms
Lead Channel Setting Pacing Pulse Width: 0.4 ms
Lead Channel Setting Sensing Sensitivity: 0.5 mV
Pulse Gen Serial Number: 111036830

## 2022-06-12 NOTE — Progress Notes (Signed)
Remote ICD transmission.   

## 2022-06-25 ENCOUNTER — Ambulatory Visit (INDEPENDENT_AMBULATORY_CARE_PROVIDER_SITE_OTHER): Payer: Medicare PPO

## 2022-06-25 DIAGNOSIS — Z95810 Presence of automatic (implantable) cardiac defibrillator: Secondary | ICD-10-CM

## 2022-06-25 DIAGNOSIS — I5022 Chronic systolic (congestive) heart failure: Secondary | ICD-10-CM | POA: Diagnosis not present

## 2022-06-25 NOTE — Progress Notes (Signed)
EPIC Encounter for ICM Monitoring  Patient Name: Michelle Andrade is a 61 y.o. female Date: 06/25/2022 Primary Care Physican: Zachery Dauer, MD Primary Cardiologist: [place] Electrophysiologist: Camnitz Bi-V Pacing: >99%          02/05/2022 Weight: 172 lbs 05/23/2022 Weight: 172 lbs                                                          Spoke with patient and heart failure questions reviewed.  Transmission results reviewed.  Pt asymptomatic for fluid accumulation.  Reports feeling well at this time and voices no complaints.     CorVue thoracic impedance normal but was suggesting possible fluid accumulation 10/6-10/15.   Prescribed:  Torsemide 20 mg take 2 tablets (40 mg total) by mouth twice a day. Potassium 20 mEq take 2 tablet (40 mEq total) by mouth twice a day.   Labs: 04/03/2022 Creatinine 1.53, BUN 34, Potassium 3.8, Sodium 137, GFR 38 11/24/2021 Creatinine 1.41, BUN 20, Potassium 4.9, Sodium 137, GFR 42 11/18/2021 Creatinine 1.67, BUN 15, Potassium 3.7, Sodium 137, GFR 34 11/14/2021 Creatinine 1.58, BUN 14, Potassium 3.8, Sodium 137, GFR 37  A complete set of results can be found in Results Review.   Recommendations:  No changes and encouraged to call if experiencing any fluid symptoms.   Follow-up plan: ICM clinic phone appointment on 07/30/2022.   91 day device clinic remote transmission 09/07/2022.     EP/Cardiology Office Visits:  Recall 04/05/2022 with Dr Haroldine Laws.   Recall 01/17/2023 with Dr Curt Bears.   Copy of ICM check sent to Dr. Curt Bears.   3 month ICM trend: 06/25/2022.    12-14 Month ICM trend:     [name], RN 06/25/2022 5:30 PM

## 2022-07-14 ENCOUNTER — Encounter (HOSPITAL_COMMUNITY): Payer: Self-pay | Admitting: Internal Medicine

## 2022-07-30 ENCOUNTER — Ambulatory Visit (INDEPENDENT_AMBULATORY_CARE_PROVIDER_SITE_OTHER): Payer: Medicare PPO

## 2022-07-30 DIAGNOSIS — I5022 Chronic systolic (congestive) heart failure: Secondary | ICD-10-CM

## 2022-07-30 DIAGNOSIS — Z95810 Presence of automatic (implantable) cardiac defibrillator: Secondary | ICD-10-CM

## 2022-08-01 NOTE — Progress Notes (Signed)
EPIC Encounter for ICM Monitoring  Patient Name: Michelle Andrade is a 61 y.o. female Date: 08/01/2022 Primary Care Physican: Zachery Dauer, MD Primary Cardiologist: [place] Electrophysiologist: Camnitz Bi-V Pacing: >99%          02/05/2022 Weight: 172 lbs 05/23/2022 Weight: 172 lbs                                                          Spoke with patient and heart failure questions reviewed.  Transmission results reviewed.  Pt asymptomatic for fluid accumulation.  Reports feeling well at this time and voices no complaints.     CorVue thoracic impedance suggesting normal fluid levels.   Prescribed:  Torsemide 20 mg take 2 tablets (40 mg total) by mouth twice a day. Potassium 20 mEq take 2 tablet (40 mEq total) by mouth twice a day.   Labs: 04/03/2022 Creatinine 1.53, BUN 34, Potassium 3.8, Sodium 137, GFR 38 Care Everywhere 11/24/2021 Creatinine 1.41, BUN 20, Potassium 4.9, Sodium 137, GFR 42 11/18/2021 Creatinine 1.67, BUN 15, Potassium 3.7, Sodium 137, GFR 34 11/14/2021 Creatinine 1.58, BUN 14, Potassium 3.8, Sodium 137, GFR 37  A complete set of results can be found in Results Review.   Recommendations:  No changes and encouraged to call if experiencing any fluid symptoms.   Follow-up plan: ICM clinic phone appointment on 09/10/2022.   91 day device clinic remote transmission 09/07/2022.     EP/Cardiology Office Visits:  Advised to call office to schedule appointment with Dr Haroldine Laws.  Recall 04/05/2022 with Dr Haroldine Laws.   Recall 01/17/2023 with Dr Curt Bears.   Copy of ICM check sent to Dr. Curt Bears.    3 month ICM trend: 07/30/2022.    12-14 Month ICM trend:     [name], RN 08/01/2022 7:38 AM

## 2022-09-07 ENCOUNTER — Ambulatory Visit (INDEPENDENT_AMBULATORY_CARE_PROVIDER_SITE_OTHER): Payer: Medicare PPO

## 2022-09-07 DIAGNOSIS — I429 Cardiomyopathy, unspecified: Secondary | ICD-10-CM | POA: Diagnosis not present

## 2022-09-07 LAB — CUP PACEART REMOTE DEVICE CHECK
Battery Remaining Longevity: 68 mo
Battery Remaining Percentage: 73 %
Battery Voltage: 2.98 V
Brady Statistic AP VP Percent: 2.7 %
Brady Statistic AP VS Percent: 1 %
Brady Statistic AS VP Percent: 97 %
Brady Statistic AS VS Percent: 1 %
Brady Statistic RA Percent Paced: 2.6 %
Date Time Interrogation Session: 20240105081724
HighPow Impedance: 70 Ohm
Implantable Lead Connection Status: 753985
Implantable Lead Connection Status: 753985
Implantable Lead Connection Status: 753985
Implantable Lead Implant Date: 20150915
Implantable Lead Implant Date: 20150915
Implantable Lead Implant Date: 20150915
Implantable Lead Location: 753858
Implantable Lead Location: 753859
Implantable Lead Location: 753860
Implantable Pulse Generator Implant Date: 20220107
Lead Channel Impedance Value: 350 Ohm
Lead Channel Impedance Value: 410 Ohm
Lead Channel Impedance Value: 450 Ohm
Lead Channel Pacing Threshold Amplitude: 0.625 V
Lead Channel Pacing Threshold Amplitude: 1.25 V
Lead Channel Pacing Threshold Amplitude: 1.375 V
Lead Channel Pacing Threshold Pulse Width: 0.4 ms
Lead Channel Pacing Threshold Pulse Width: 0.4 ms
Lead Channel Pacing Threshold Pulse Width: 0.4 ms
Lead Channel Sensing Intrinsic Amplitude: 12 mV
Lead Channel Sensing Intrinsic Amplitude: 3.2 mV
Lead Channel Setting Pacing Amplitude: 1.625
Lead Channel Setting Pacing Amplitude: 2.25 V
Lead Channel Setting Pacing Amplitude: 2.375
Lead Channel Setting Pacing Pulse Width: 0.4 ms
Lead Channel Setting Pacing Pulse Width: 0.4 ms
Lead Channel Setting Sensing Sensitivity: 0.5 mV
Pulse Gen Serial Number: 111036830

## 2022-09-10 ENCOUNTER — Ambulatory Visit (INDEPENDENT_AMBULATORY_CARE_PROVIDER_SITE_OTHER): Payer: Medicare PPO

## 2022-09-10 DIAGNOSIS — I5022 Chronic systolic (congestive) heart failure: Secondary | ICD-10-CM | POA: Diagnosis not present

## 2022-09-10 DIAGNOSIS — Z95810 Presence of automatic (implantable) cardiac defibrillator: Secondary | ICD-10-CM

## 2022-09-11 ENCOUNTER — Other Ambulatory Visit (HOSPITAL_COMMUNITY): Payer: Self-pay

## 2022-09-11 ENCOUNTER — Other Ambulatory Visit (HOSPITAL_BASED_OUTPATIENT_CLINIC_OR_DEPARTMENT_OTHER): Payer: Self-pay

## 2022-09-11 MED ORDER — TRULICITY 1.5 MG/0.5ML ~~LOC~~ SOAJ
SUBCUTANEOUS | 3 refills | Status: DC
  Filled 2022-09-11: qty 2, 28d supply, fill #0
  Filled 2022-10-03 – 2022-10-04 (×2): qty 2, 28d supply, fill #1
  Filled 2022-11-11: qty 2, 28d supply, fill #2
  Filled 2022-12-05: qty 2, 28d supply, fill #3
  Filled 2023-01-04 – 2023-01-11 (×3): qty 2, 28d supply, fill #4
  Filled 2023-02-01: qty 2, 28d supply, fill #5
  Filled 2023-03-02: qty 2, 28d supply, fill #6
  Filled 2023-03-31: qty 2, 28d supply, fill #7

## 2022-09-12 NOTE — Progress Notes (Signed)
EPIC Encounter for ICM Monitoring  Patient Name: Michelle Andrade is a 62 y.o. female Date: 09/12/2022 Primary Care Physican: Michelle Dauer, MD Primary Cardiologist: Michelle Andrade Electrophysiologist: Michelle Andrade Bi-V Pacing: >99%          02/05/2022 Weight: 172 lbs 05/23/2022 Weight: 172 lbs 09/12/2022 Weight: 175 lbs                                                          Spoke with patient and heart failure questions reviewed.  Transmission results reviewed.  Pt asymptomatic for fluid accumulation.  Reports feeling well at this time and voices no complaints.     CorVue thoracic impedance suggesting normal fluid levels.   Prescribed:  Torsemide 20 mg take 2 tablets (40 mg total) by mouth twice a day. Potassium 20 mEq take 2 tablet (40 mEq total) by mouth twice a day.   Labs: 08/03/2022 Creatinine 1.62, BUN 20, Potassium 3.9, Sodium 140, GFR 36 04/03/2022 Creatinine 1.53, BUN 34, Potassium 3.8, Sodium 137, GFR 38 Care Everywhere 11/24/2021 Creatinine 1.41, BUN 20, Potassium 4.9, Sodium 137, GFR 42 11/18/2021 Creatinine 1.67, BUN 15, Potassium 3.7, Sodium 137, GFR 34 11/14/2021 Creatinine 1.58, BUN 14, Potassium 3.8, Sodium 137, GFR 37  A complete set of results can be found in Results Review.   Recommendations:  No changes and encouraged to call if experiencing any fluid symptoms.   Follow-up plan: ICM clinic phone appointment on 10/15/2022.   91 day device clinic remote transmission 12/07/2022.     EP/Cardiology Office Visits:  09/26/2022 with Dr Michelle Andrade.   Recall 01/17/2023 with Dr Michelle Andrade.   Copy of ICM check sent to Dr. Curt Andrade.   3 month ICM trend: 09/10/2022.    12-14 Month ICM trend:     Michelle Billings, RN 09/12/2022 12:18 PM

## 2022-09-25 NOTE — Progress Notes (Signed)
Remote ICD transmission.   

## 2022-09-26 ENCOUNTER — Ambulatory Visit (HOSPITAL_COMMUNITY)
Admission: RE | Admit: 2022-09-26 | Discharge: 2022-09-26 | Disposition: A | Discharge status: Home / Self Care | Payer: Medicare PPO | Source: Ambulatory Visit | Attending: Internal Medicine | Admitting: Internal Medicine

## 2022-09-26 ENCOUNTER — Encounter (HOSPITAL_COMMUNITY): Payer: Self-pay | Admitting: Internal Medicine

## 2022-09-26 VITALS — BP 120/68 | HR 68 | Wt 175.4 lb

## 2022-09-26 DIAGNOSIS — I2510 Atherosclerotic heart disease of native coronary artery without angina pectoris: Secondary | ICD-10-CM | POA: Insufficient documentation

## 2022-09-26 DIAGNOSIS — I959 Hypotension, unspecified: Secondary | ICD-10-CM | POA: Insufficient documentation

## 2022-09-26 DIAGNOSIS — Z7982 Long term (current) use of aspirin: Secondary | ICD-10-CM | POA: Insufficient documentation

## 2022-09-26 DIAGNOSIS — I4719 Other supraventricular tachycardia: Secondary | ICD-10-CM | POA: Diagnosis not present

## 2022-09-26 DIAGNOSIS — N183 Chronic kidney disease, stage 3 unspecified: Secondary | ICD-10-CM | POA: Diagnosis not present

## 2022-09-26 DIAGNOSIS — I428 Other cardiomyopathies: Secondary | ICD-10-CM | POA: Diagnosis not present

## 2022-09-26 DIAGNOSIS — E1122 Type 2 diabetes mellitus with diabetic chronic kidney disease: Secondary | ICD-10-CM | POA: Insufficient documentation

## 2022-09-26 DIAGNOSIS — Z95810 Presence of automatic (implantable) cardiac defibrillator: Secondary | ICD-10-CM

## 2022-09-26 DIAGNOSIS — I130 Hypertensive heart and chronic kidney disease with heart failure and stage 1 through stage 4 chronic kidney disease, or unspecified chronic kidney disease: Secondary | ICD-10-CM | POA: Insufficient documentation

## 2022-09-26 DIAGNOSIS — Z79899 Other long term (current) drug therapy: Secondary | ICD-10-CM | POA: Insufficient documentation

## 2022-09-26 DIAGNOSIS — I5022 Chronic systolic (congestive) heart failure: Secondary | ICD-10-CM | POA: Insufficient documentation

## 2022-09-26 DIAGNOSIS — N1831 Chronic kidney disease, stage 3a: Secondary | ICD-10-CM | POA: Insufficient documentation

## 2022-09-26 DIAGNOSIS — E785 Hyperlipidemia, unspecified: Secondary | ICD-10-CM | POA: Diagnosis not present

## 2022-09-26 DIAGNOSIS — R42 Dizziness and giddiness: Secondary | ICD-10-CM | POA: Insufficient documentation

## 2022-09-26 DIAGNOSIS — Z7984 Long term (current) use of oral hypoglycemic drugs: Secondary | ICD-10-CM | POA: Diagnosis not present

## 2022-09-26 LAB — BASIC METABOLIC PANEL
Anion gap: 9 (ref 5–15)
BUN: 18 mg/dL (ref 8–23)
CO2: 25 mmol/L (ref 22–32)
Calcium: 8.9 mg/dL (ref 8.9–10.3)
Chloride: 104 mmol/L (ref 98–111)
Creatinine, Ser: 1.57 mg/dL — ABNORMAL HIGH (ref 0.44–1.00)
GFR, Estimated: 37 mL/min — ABNORMAL LOW (ref >60)
Glucose, Bld: 97 mg/dL (ref 70–99)
Potassium: 3.9 mmol/L (ref 3.5–5.1)
Sodium: 138 mmol/L (ref 135–145)

## 2022-09-26 LAB — T4, FREE: Free T4: 1.03 ng/dL (ref 0.61–1.12)

## 2022-09-26 LAB — BRAIN NATRIURETIC PEPTIDE: B Natriuretic Peptide: 48.4 pg/mL (ref 0.0–100.0)

## 2022-09-26 LAB — TSH: TSH: 2.923 u[IU]/mL (ref 0.350–4.500)

## 2022-09-26 MED ORDER — HYDRALAZINE HCL 50 MG PO TABS: 50.0000 mg | ORAL_TABLET | Freq: Three times a day (TID) | ORAL | 3 refills | Status: DC

## 2022-09-26 MED ORDER — ISOSORBIDE MONONITRATE ER 30 MG PO TB24: 30.0000 mg | ORAL_TABLET | Freq: Every day | ORAL | 3 refills | Status: DC

## 2022-09-26 MED ORDER — HYDRALAZINE HCL 25 MG PO TABS: 25.0000 mg | ORAL_TABLET | Freq: Three times a day (TID) | ORAL | 3 refills | Status: DC

## 2022-09-26 NOTE — Addendum Note (Signed)
Encounter addended by: Jerl Mina, RN on: 09/26/2022 2:45 PM  Actions taken: Order list changed

## 2022-09-26 NOTE — Progress Notes (Addendum)
Advanced Heart Failure Clinic Note   HF Cardiologist: Dr. Haroldine Laws  Nephrology: Dr Valaria Good [facility].   Reason for Visit: f/u Chronic Systolic Heart Failure  HPI:  Michelle Andrade is a 62 y.o. female with a past medical history of HTN, chronic systolic HF due to NICM (normal cors on cath 2014, nonobs CAD on cath 5/21) s/p CRT-D ([employer]), atrial tachycardia (2017), CKD stage III, DM, OSA w/CPAP.   Admitted 4/18 with ADHF at [facility]. Echo that admission with EF 25-30%.   Has angioedema with ACE-I and cough with ARB.    Echo 3/20 LVEF 25-30%   Admitted 5/21 LHC showed moderate, nonobstructive CAD w/ 50-60% RCA, flow wire 1.0 (non-flow limiting). Medical therapy recommended.  Echo 12/21 EF 25-30%   Echo 6/23 EF 35-40% (outside echo - images unavailable)  ICD changed in 1/22.  Today she returns for HF follow up. Feeling pretty good.. Followed by Sharman Cheek in [facility] clinic. Gets fatigued easily. DOE on mild exertion. No edema, orthopnea or PND.  Compliant with meds. No ICD firings. Followed by Sharman Cheek in [facility] clinic and fluid good. BP drops < 100 at times.   Review of systems complete and found to be negative unless listed in HPI.    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   CAD (coronary artery disease) 09/01/2013   CHF (congestive heart failure) ([place]) 06/07/2015   CKD (chronic kidney disease), stage III ([place]) 12/09/2013   Depression 07/27/2013   Diabetes mellitus without complication ([place]) 44/31/5400   GERD (gastroesophageal reflux disease) 07/27/2013   Gout 12/29/2013   Heart valve disorder 06/03/2013   High cholesterol 11/10/2013   Hypertension 11/10/2013   IBS (irritable bowel syndrome) 09/27/2014   Myocardial infarct [facility])    Sleep apnea 10/13/2013   Vascular disorder 01/12/2014   Current Outpatient Medications  Medication Sig Dispense Refill    allopurinol (ZYLOPRIM) 100 MG tablet Take 200 mg by mouth daily.      amiodarone (PACERONE) 200 MG tablet Take 0.5 tablets (100 mg total) by mouth daily. 90 tablet 3   aspirin 81 MG chewable tablet Chew 81 mg by mouth daily.     atorvastatin (LIPITOR) 80 MG tablet Take 1 tablet (80 mg total) by mouth at bedtime. 90 tablet 3   Calcium Carbonate-Vitamin D 600-400 MG-UNIT tablet Take 1 tablet by mouth 2 (two) times daily.     carvedilol (COREG) 25 MG tablet Take 1 tablet (25 mg total) by mouth 2 (two) times daily with a meal. 180 tablet 3   colchicine 0.6 MG tablet Take 0.6 mg by mouth 2 (two) times daily as needed (Gout).     cyclobenzaprine (FLEXERIL) 5 MG tablet Take 5 mg by mouth 3 (three) times daily as needed for muscle spasms.     diclofenac Sodium (VOLTAREN ARTHRITIS PAIN) 1 % GEL Apply 1 g topically as needed.     Dulaglutide (TRULICITY) 1.5 QQ/7.6PP SOPN Inject 1.5 mg into the skin once weekly 6 mL 3   empagliflozin (JARDIANCE) 10 MG TABS tablet Take 1 tablet (10 mg total) by mouth daily before breakfast. 90 tablet 3   ferrous sulfate 325 (65 FE) MG tablet Take 325 mg by mouth 3 (three) times daily.      hydrALAZINE (APRESOLINE) 25 MG tablet Take 3 tablets (75 mg total) by mouth 3 (three) times daily. 500 tablet 3   isosorbide mononitrate (IMDUR) 60 MG  24 hr tablet Take 1 tablet (60 mg total) by mouth daily. 30 tablet 0   Magnesium Oxide 420 MG TABS Take 840 mg by mouth daily.     Multiple Vitamin (MULTIVITAMIN) tablet Take 1 tablet by mouth daily.     omeprazole (PRILOSEC) 40 MG capsule Take 40 mg by mouth daily.     potassium chloride SA (KLOR-CON M) 20 MEQ tablet Take 2 tablets (40 mEq total) by mouth 2 (two) times daily. 120 tablet 0   rOPINIRole (REQUIP) 0.5 MG tablet Take 0.5 mg by mouth at bedtime.      sertraline (ZOLOFT) 100 MG tablet Patient takes 2 tablet at bedtime.     torsemide (DEMADEX) 20 MG tablet Take 2 tablets (40 mg total) by mouth 2 (two) times daily. 112 tablet 5    No current facility-administered medications for this encounter.   Allergies  Allergen Reactions   Codeine Itching   Gabapentin Itching   Haldol [Haloperidol Lactate] Other (See Comments)    Dizziness, hallucinations   Levofloxacin     Other reaction(s): Other (See Comments) Projectile vomiting    Lisinopril Itching and Swelling   Losartan Itching   Morphine And Related Nausea And Vomiting   Penicillins Nausea And Vomiting    "Vomiting, upset stomach, Headache" Has patient had a PCN reaction causing immediate rash, facial/tongue/throat swelling, SOB or lightheadedness with hypotension: No Has patient had a PCN reaction causing severe rash involving mucus membranes or skin necrosis: No Has patient had a PCN reaction that required hospitalization: No Has patient had a PCN reaction occurring within the last 10 years: No If all of the above answers are "NO", then may proceed with Cephalosporin use.    Social History   Socioeconomic History   Marital status: Single    Spouse name: Not on file   Number of children: Not on file   Years of education: Not on file   Highest education level: Not on file  Occupational History   Not on file  Tobacco Use   Smoking status: Never   Smokeless tobacco: Never  Vaping Use   Vaping Use: Never used  Substance and Sexual Activity   Alcohol use: No   Drug use: No   Sexual activity: Yes  Other Topics Concern   Not on file  Social History Narrative   Not on file   Social Determinants of Health   Financial Resource Strain: Not on file  Food Insecurity: Not on file  Transportation Needs: Not on file  Physical Activity: Not on file  Stress: Not on file  Social Connections: Not on file  Intimate Partner Violence: Not on file   Family History  Problem Relation Age of Onset   Hypertension Mother    BP 120/68   Pulse 68   Wt 79.6 kg (175 lb 6.4 oz)   SpO2 97%   BMI 31.07 kg/m   Wt Readings from Last 3 Encounters:  09/26/22  79.6 kg (175 lb 6.4 oz)  01/22/22 77.6 kg (171 lb)  12/04/21 78.9 kg (174 lb)    PHYSICAL EXAM: General:  Well appearing. No resp difficulty HEENT: normal Neck: supple. no JVD. Carotids 2+ bilat; no bruits. No lymphadenopathy or thryomegaly appreciated. Cor: PMI nondisplaced. Regular rate & rhythm. No rubs, gallops or murmurs. Lungs: clear Abdomen: soft, nontender, nondistended. No hepatosplenomegaly. No bruits or masses. Good bowel sounds. Extremities: no cyanosis, clubbing, rash, edema Neuro: alert & orientedx3, cranial nerves grossly intact. moves all 4 extremities w/o difficulty.  Affect pleasant  ECG:  Sinus 66 bivpacing Personally reviewed  ICD interrogation (personally reviewed): Not done today  ASSESSMENT & PLAN: 1. Chronic systolic CHF:  - NICM, normal cors in 2014, likely related to HTN vs. Noncompliance vs. Tachy- medicated with history of atrial tach. - s/p STJ CRT-D - Echo 02/2017 EF 20%, moderate MR, moderate TR. PA pressure 80 mm Hg.    - Echo 11/2018 EF 25-30% - Cath 5/21: Nonobstructive CAD RCA 60% lesion - Echo 12/21 EF 25-30% RV ok  - Echo 6/23 EF 35-40% (outside echo - images unavailable) - Stable NYHA II-III. Volume ok. - No ARB/ ANRI due to angioedema/CKD.  - No spiro nor digoxin due to CKD.  - Decrease hydralazine 50 mg tid -> 25 tid due to low BP and dizziness  - Continue torsemide 40 mg bid. - Decrease Imdur 30 mg daily. - Continue carvedilol 25 mg bid.  - Continue Jardiance 10 mg daily - labs today   2. CAD, non-obstructive - LHC 01/2020 50-60% RCA, negative FFR (non-flow limiting). - No s/s angina - Continue medical therapy w/ ASA + Statin+ Imdur + ? blocker.  3. HLD - Continue atorva 80. - Goal LDL < 70. - Followed by PCP.  4. CKD stage IIIa  - Baseline creatinine 1.3-1.6.  - Follows with Dr. Aquilla Hacker.  5. HTN - BP running low. Having dizziness. Med changes as above  6. History of atrial tachycardia - Followed by Dr. Curt Bears  - Continue  amiodarone 100 mg daily -> labs    Glori Bickers, MD 09/26/22

## 2022-09-26 NOTE — Addendum Note (Signed)
Encounter addended by: Jerl Mina, RN on: 09/26/2022 2:32 PM  Actions taken: Order list changed

## 2022-09-26 NOTE — Addendum Note (Signed)
Encounter addended by: Jolaine Artist, MD on: 09/26/2022 2:49 PM  Actions taken: Clinical Note Signed

## 2022-09-26 NOTE — Addendum Note (Signed)
Encounter addended by: Jerl Mina, RN on: 09/26/2022 2:27 PM  Actions taken: Order list changed, Diagnosis association updated, Clinical Note Signed, Charge Capture section accepted

## 2022-09-26 NOTE — Patient Instructions (Signed)
DECREASE Imdur to 30 mg daily.  DECREASE Hydralazine to 50 mg Three times a day  Labs done today, your results will be available in MyChart, we will contact you for abnormal readings.  Your physician has requested that you have an echocardiogram. Echocardiography is a painless test that uses sound waves to create images of your heart. It provides your doctor with information about the size and shape of your heart and how well your heart's chambers and valves are working. This procedure takes approximately one hour. There are no restrictions for this procedure. Please do NOT wear cologne, perfume, aftershave, or lotions (deodorant is allowed). Please arrive 15 minutes prior to your appointment time.  Your physician recommends that you schedule a follow-up appointment in: 6 months with an echocardiogram (July 2024) ** please call the office in May to arrange your follow up appointment **  If you have any questions or concerns before your next appointment please send [place] a message through [place] or call our office at [phone].    TO LEAVE A MESSAGE FOR THE NURSE SELECT OPTION 2, PLEASE LEAVE A MESSAGE INCLUDING: YOUR NAME DATE OF BIRTH CALL BACK NUMBER REASON FOR CALL**this is important as we prioritize the call backs  YOU WILL RECEIVE A CALL BACK THE SAME DAY AS LONG AS YOU CALL BEFORE 4:00 PM  At the [facility], you and your health needs are our priority. As part of our continuing mission to provide you with exceptional heart care, we have created designated Provider Care Teams. These Care Teams include your primary Cardiologist (physician) and Advanced Practice Providers (APPs- Physician Assistants and Nurse Practitioners) who all work together to provide you with the care you need, when you need it.   You may see any of the following providers on your designated Care Team at your next follow up: Dr Glori Bickers Dr Loralie Champagne Dr. Roxana Hires,  NP Lyda Jester, [place] [facility] [place], [place] Forestine Na, NP Audry Riles, PharmD   Please be sure to bring in all your medications bottles to every appointment.

## 2022-09-28 LAB — T3, FREE: T3, Free: 2.6 pg/mL (ref 2.0–4.4)

## 2022-10-04 ENCOUNTER — Other Ambulatory Visit (HOSPITAL_COMMUNITY): Payer: Self-pay

## 2022-10-04 ENCOUNTER — Other Ambulatory Visit: Payer: Self-pay

## 2022-10-15 ENCOUNTER — Ambulatory Visit: Payer: Medicare PPO | Attending: Cardiology

## 2022-10-15 DIAGNOSIS — Z95810 Presence of automatic (implantable) cardiac defibrillator: Secondary | ICD-10-CM

## 2022-10-15 DIAGNOSIS — I5022 Chronic systolic (congestive) heart failure: Secondary | ICD-10-CM | POA: Diagnosis not present

## 2022-10-17 ENCOUNTER — Telehealth: Payer: Self-pay

## 2022-10-17 NOTE — Telephone Encounter (Signed)
Remote ICM transmission received.  Attempted call to patient regarding ICM remote transmission and no message.  Attempted home and cell number.

## 2022-10-17 NOTE — Progress Notes (Signed)
EPIC Encounter for ICM Monitoring  Patient Name: Michelle Andrade is a 62 y.o. female Date: 10/17/2022 Primary Care Physican: Zachery Dauer, MD Primary Cardiologist: Bensimhon Electrophysiologist: Camnitz Bi-V Pacing: >99%          02/05/2022 Weight: 172 lbs 05/23/2022 Weight: 172 lbs 09/12/2022 Weight: 175 lbs                                                          Attempted call to patient and unable to reach.   Transmission reviewed.    CorVue thoracic impedance suggesting normal fluid levels.   Prescribed:  Torsemide 20 mg take 2 tablets (40 mg total) by mouth twice a day. Potassium 20 mEq take 2 tablet (40 mEq total) by mouth twice a day.   Labs: 09/26/2022 Creatinine 1.57, BUN 18, Potassium 3.9, Sodium 138, GFR 37 08/03/2022 Creatinine 1.62, BUN 20, Potassium 3.9, Sodium 140, GFR 36 04/03/2022 Creatinine 1.53, BUN 34, Potassium 3.8, Sodium 137, GFR 38 Care Everywhere 11/24/2021 Creatinine 1.41, BUN 20, Potassium 4.9, Sodium 137, GFR 42 11/18/2021 Creatinine 1.67, BUN 15, Potassium 3.7, Sodium 137, GFR 34 11/14/2021 Creatinine 1.58, BUN 14, Potassium 3.8, Sodium 137, GFR 37  A complete set of results can be found in Results Review.   Recommendations:  Unable to reach.     Follow-up plan: ICM clinic phone appointment on 11/19/2022.   91 day device clinic remote transmission 12/07/2022.     EP/Cardiology Office Visits:  Recall  03/25/2023 with Dr Haroldine Laws.   Recall 01/17/2023 with Dr Curt Bears.   Copy of ICM check sent to Dr. Curt Bears.   3 month ICM trend: 10/15/2022.    12-14 Month ICM trend:     [name], RN 10/17/2022 3:25 PM

## 2022-11-12 ENCOUNTER — Other Ambulatory Visit (HOSPITAL_COMMUNITY): Payer: Self-pay

## 2022-11-19 ENCOUNTER — Ambulatory Visit: Payer: Medicare PPO | Attending: Cardiology

## 2022-11-19 DIAGNOSIS — I5022 Chronic systolic (congestive) heart failure: Secondary | ICD-10-CM

## 2022-11-19 DIAGNOSIS — Z95810 Presence of automatic (implantable) cardiac defibrillator: Secondary | ICD-10-CM

## 2022-11-23 ENCOUNTER — Telehealth: Payer: Self-pay

## 2022-11-23 NOTE — Telephone Encounter (Signed)
Remote ICM transmission received.  Attempted call to patient regarding ICM remote transmission and message to return call. 

## 2022-11-23 NOTE — Progress Notes (Signed)
EPIC Encounter for ICM Monitoring  Patient Name: Michelle Andrade is a 62 y.o. female Date: 11/23/2022 Primary Care Physican: Zachery Dauer, MD Primary Cardiologist: Bensimhon Electrophysiologist: Camnitz Bi-V Pacing: >99%          02/05/2022 Weight: 172 lbs 05/23/2022 Weight: 172 lbs 09/12/2022 Weight: 175 lbs                                                          Attempted call to patient and unable to reach.   Left message to return call. Transmission reviewed.    CorVue thoracic impedance suggesting normal fluid levels.   Prescribed:  Torsemide 20 mg take 2 tablets (40 mg total) by mouth twice a day. Potassium 20 mEq take 2 tablet (40 mEq total) by mouth twice a day.   Labs: 09/26/2022 Creatinine 1.57, BUN 18, Potassium 3.9, Sodium 138, GFR 37 08/03/2022 Creatinine 1.62, BUN 20, Potassium 3.9, Sodium 140, GFR 36 04/03/2022 Creatinine 1.53, BUN 34, Potassium 3.8, Sodium 137, GFR 38 Care Everywhere 11/24/2021 Creatinine 1.41, BUN 20, Potassium 4.9, Sodium 137, GFR 42 11/18/2021 Creatinine 1.67, BUN 15, Potassium 3.7, Sodium 137, GFR 34 11/14/2021 Creatinine 1.58, BUN 14, Potassium 3.8, Sodium 137, GFR 37  A complete set of results can be found in Results Review.   Recommendations: Unable to reach.     Follow-up plan: ICM clinic phone appointment on 12/24/2022.   91 day device clinic remote transmission 12/07/2022.     EP/Cardiology Office Visits:  Recall 03/25/2023 with Dr Haroldine Laws.   Recall 01/17/2023 with Dr Curt Bears.   Copy of ICM check sent to Dr. Curt Bears.   3 month ICM trend: 11/19/2022.    12-14 Month ICM trend:     [name], RN 11/23/2022 1:47 PM

## 2022-11-30 ENCOUNTER — Other Ambulatory Visit (HOSPITAL_COMMUNITY): Payer: Self-pay | Admitting: Family Medicine

## 2022-12-07 ENCOUNTER — Ambulatory Visit (INDEPENDENT_AMBULATORY_CARE_PROVIDER_SITE_OTHER): Payer: Medicare PPO

## 2022-12-07 DIAGNOSIS — I429 Cardiomyopathy, unspecified: Secondary | ICD-10-CM | POA: Diagnosis not present

## 2022-12-07 LAB — CUP PACEART REMOTE DEVICE CHECK
Battery Remaining Longevity: 65 mo
Battery Remaining Percentage: 70 %
Battery Voltage: 2.96 V
Brady Statistic AP VP Percent: 3.1 %
Brady Statistic AP VS Percent: 1 %
Brady Statistic AS VP Percent: 97 %
Brady Statistic AS VS Percent: 1 %
Brady Statistic RA Percent Paced: 3 %
Date Time Interrogation Session: 20240405020128
HighPow Impedance: 71 Ohm
Implantable Lead Connection Status: 753985
Implantable Lead Connection Status: 753985
Implantable Lead Connection Status: 753985
Implantable Lead Implant Date: 20150915
Implantable Lead Implant Date: 20150915
Implantable Lead Implant Date: 20150915
Implantable Lead Location: 753858
Implantable Lead Location: 753859
Implantable Lead Location: 753860
Implantable Pulse Generator Implant Date: 20220107
Lead Channel Impedance Value: 380 Ohm
Lead Channel Impedance Value: 410 Ohm
Lead Channel Impedance Value: 430 Ohm
Lead Channel Pacing Threshold Amplitude: 0.625 V
Lead Channel Pacing Threshold Amplitude: 1.25 V
Lead Channel Pacing Threshold Amplitude: 1.375 V
Lead Channel Pacing Threshold Pulse Width: 0.4 ms
Lead Channel Pacing Threshold Pulse Width: 0.4 ms
Lead Channel Pacing Threshold Pulse Width: 0.4 ms
Lead Channel Sensing Intrinsic Amplitude: 12 mV
Lead Channel Sensing Intrinsic Amplitude: 3.3 mV
Lead Channel Setting Pacing Amplitude: 1.625
Lead Channel Setting Pacing Amplitude: 2.25 V
Lead Channel Setting Pacing Amplitude: 2.375
Lead Channel Setting Pacing Pulse Width: 0.4 ms
Lead Channel Setting Pacing Pulse Width: 0.4 ms
Lead Channel Setting Sensing Sensitivity: 0.5 mV
Pulse Gen Serial Number: 111036830

## 2022-12-12 ENCOUNTER — Other Ambulatory Visit (HOSPITAL_COMMUNITY): Payer: Self-pay

## 2022-12-24 ENCOUNTER — Ambulatory Visit: Payer: Medicare PPO | Attending: Cardiology

## 2022-12-24 DIAGNOSIS — I5022 Chronic systolic (congestive) heart failure: Secondary | ICD-10-CM | POA: Diagnosis not present

## 2022-12-24 DIAGNOSIS — Z95810 Presence of automatic (implantable) cardiac defibrillator: Secondary | ICD-10-CM

## 2022-12-26 NOTE — Progress Notes (Signed)
EPIC Encounter for ICM Monitoring  Patient Name: Michelle Andrade is a 62 y.o. female Date: 12/26/2022 Primary Care Physican: Greta Doom, MD Primary Cardiologist: Bensimhon Electrophysiologist: Camnitz Bi-V Pacing: >99%          02/05/2022 Weight: 172 lbs 05/23/2022 Weight: 172 lbs 09/12/2022 Weight: 175 lbs 12/26/2022 Weight: 179 lbs                                                          Spoke with patient and heart failure questions reviewed.  Transmission results reviewed.  Pt asymptomatic for fluid accumulation.  Reports feeling well at this time and voices no complaints.     CorVue thoracic impedance suggesting normal fluid levels with exception of possible fluid accumulation from 4/13-4/17.   Prescribed:  Torsemide 20 mg take 2 tablets (40 mg total) by mouth twice a day. Potassium 20 mEq take 2 tablet (40 mEq total) by mouth twice a day.   Labs: 09/26/2022 Creatinine 1.57, BUN 18, Potassium 3.9, Sodium 138, GFR 37 08/03/2022 Creatinine 1.62, BUN 20, Potassium 3.9, Sodium 140, GFR 36 04/03/2022 Creatinine 1.53, BUN 34, Potassium 3.8, Sodium 137, GFR 38 Care Everywhere 11/24/2021 Creatinine 1.41, BUN 20, Potassium 4.9, Sodium 137, GFR 42 11/18/2021 Creatinine 1.67, BUN 15, Potassium 3.7, Sodium 137, GFR 34 11/14/2021 Creatinine 1.58, BUN 14, Potassium 3.8, Sodium 137, GFR 37  A complete set of results can be found in Results Review.   Recommendations:  No changes and encouraged to call if experiencing any fluid symptoms.   Follow-up plan: ICM clinic phone appointment on 01/29/2023.   91 day device clinic remote transmission 03/08/2023.     EP/Cardiology Office Visits:  Recall 03/25/2023 with Dr Gala Romney.   Recall 01/17/2023 with Dr Elberta Fortis.   Copy of ICM check sent to Dr. Elberta Fortis.    3 month ICM trend: 12/24/2022.    12-14 Month ICM trend:     [name], RN 12/26/2022 10:40 AM

## 2023-01-04 ENCOUNTER — Other Ambulatory Visit (HOSPITAL_COMMUNITY): Payer: Self-pay

## 2023-01-04 ENCOUNTER — Other Ambulatory Visit: Payer: Self-pay

## 2023-01-07 ENCOUNTER — Other Ambulatory Visit (HOSPITAL_COMMUNITY): Payer: Self-pay

## 2023-01-08 NOTE — Progress Notes (Signed)
Remote ICD transmission.   

## 2023-01-11 ENCOUNTER — Other Ambulatory Visit: Payer: Self-pay

## 2023-01-11 ENCOUNTER — Other Ambulatory Visit (HOSPITAL_COMMUNITY): Payer: Self-pay

## 2023-01-17 ENCOUNTER — Encounter (HOSPITAL_COMMUNITY): Payer: Self-pay | Admitting: Internal Medicine

## 2023-01-18 ENCOUNTER — Other Ambulatory Visit (HOSPITAL_COMMUNITY): Payer: Self-pay

## 2023-01-18 MED ORDER — FERROUS SULFATE 325 (65 FE) MG PO TABS: 325.0000 mg | ORAL_TABLET | Freq: Three times a day (TID) | ORAL | 0 refills | Status: AC

## 2023-01-29 ENCOUNTER — Ambulatory Visit: Payer: Medicare PPO | Attending: Cardiology

## 2023-01-29 DIAGNOSIS — I5022 Chronic systolic (congestive) heart failure: Secondary | ICD-10-CM

## 2023-01-29 DIAGNOSIS — Z95810 Presence of automatic (implantable) cardiac defibrillator: Secondary | ICD-10-CM

## 2023-01-29 NOTE — Progress Notes (Signed)
EPIC Encounter for ICM Monitoring  Patient Name: Michelle Andrade is a 62 y.o. female Date: 01/29/2023 Primary Care Physican: Michelle Doom, MD Primary Cardiologist: Michelle Andrade Electrophysiologist: Michelle Andrade Bi-V Pacing: >99%          02/05/2022 Weight: 172 lbs 05/23/2022 Weight: 172 lbs 09/12/2022 Weight: 175 lbs 12/26/2022 Weight: 179 lbs                                                          Spoke with patient and heart failure questions reviewed.  Transmission results reviewed.  Pt reports she has SOB.   She has pulled muscle next to her groin.      Diet:  She does not limit salt or fluid intake.    CorVue thoracic impedance suggesting possible fluid accumulation starting 5/27 and also from 5/17-5/25.   Prescribed:  Torsemide 20 mg take 2 tablets (40 mg total) by mouth twice a day. Potassium 20 mEq take 2 tablet (40 mEq total) by mouth twice a day.   Labs: 09/26/2022 Creatinine 1.57, BUN 18, Potassium 3.9, Sodium 138, GFR 37 08/03/2022 Creatinine 1.62, BUN 20, Potassium 3.9, Sodium 140, GFR 36 04/03/2022 Creatinine 1.53, BUN 34, Potassium 3.8, Sodium 137, GFR 38 Care Everywhere 11/24/2021 Creatinine 1.41, BUN 20, Potassium 4.9, Sodium 137, GFR 42 11/18/2021 Creatinine 1.67, BUN 15, Potassium 3.7, Sodium 137, GFR 34 11/14/2021 Creatinine 1.58, BUN 14, Potassium 3.8, Sodium 137, GFR 37  A complete set of results can be found in Results Review.   Recommendations:  Advised to limit fluid intake but she says she typically does not follow the recommendation.  Sent to Dr Michelle Andrade for review and recommendations if needed.     Follow-up plan: ICM clinic phone appointment on 02/05/2023 to recheck fluid levels.   91 day device clinic remote transmission 03/08/2023.     EP/Cardiology Office Visits:  Advised to call Dr Michelle Andrade and Dr Michelle Andrade office for appointments.  Provided EP scheduler number.   Recall 03/25/2023 with Dr Michelle Andrade (6 mo w/echo).   Recall 01/17/2023 with Dr Michelle Andrade.   Copy of ICM  check sent to Dr. Elberta Andrade.    3 month ICM trend: 01/29/2023.    12-14 Month ICM trend:     Michelle Soda, RN 01/29/2023 7:46 AM

## 2023-02-05 ENCOUNTER — Ambulatory Visit: Payer: Medicare PPO | Attending: Cardiology

## 2023-02-05 DIAGNOSIS — I5022 Chronic systolic (congestive) heart failure: Secondary | ICD-10-CM

## 2023-02-05 DIAGNOSIS — Z95810 Presence of automatic (implantable) cardiac defibrillator: Secondary | ICD-10-CM

## 2023-02-05 NOTE — Progress Notes (Signed)
EPIC Encounter for ICM Monitoring  Patient Name: Michelle Andrade is a 62 y.o. female Date: 02/05/2023 Primary Care Physican: Greta Doom, MD Primary Cardiologist: Bensimhon Electrophysiologist: Camnitz Bi-V Pacing: >99%          02/05/2022 Weight: 172 lbs 05/23/2022 Weight: 172 lbs 09/12/2022 Weight: 175 lbs 12/26/2022 Weight: 179 lbs 02/05/2023 Weight:  does not weigh at home due to history of bulemia                                                          Spoke with patient and heart failure questions reviewed.  Transmission results reviewed.  Pt reports SOB has resolved.   She has pulled muscle next to her groin.      Diet:  She does not limit salt or fluid intake.     CorVue thoracic impedance suggesting fluid levels returned to normal after limiting fluid intake as recommended.   Prescribed:  Torsemide 20 mg take 2 tablets (40 mg total) by mouth twice a day. Potassium 20 mEq take 2 tablet (40 mEq total) by mouth twice a day.   Labs: 09/26/2022 Creatinine 1.57, BUN 18, Potassium 3.9, Sodium 138, GFR 37 08/03/2022 Creatinine 1.62, BUN 20, Potassium 3.9, Sodium 140, GFR 36 04/03/2022 Creatinine 1.53, BUN 34, Potassium 3.8, Sodium 137, GFR 38 Care Everywhere 11/24/2021 Creatinine 1.41, BUN 20, Potassium 4.9, Sodium 137, GFR 42 11/18/2021 Creatinine 1.67, BUN 15, Potassium 3.7, Sodium 137, GFR 34 11/14/2021 Creatinine 1.58, BUN 14, Potassium 3.8, Sodium 137, GFR 37  A complete set of results can be found in Results Review.   Recommendations:  No changes and encouraged to call if experiencing any fluid symptoms.    Follow-up plan: ICM clinic phone appointment on 03/04/2023.   91 day device clinic remote transmission 03/08/2023.     EP/Cardiology Office Visits:    04/26/2023 with Dr Gala Romney (6 mo w/echo).   02/27/2023 with Dr Elberta Fortis.   Copy of ICM check sent to Dr. Elberta Fortis.    3 month ICM trend: 02/04/2023.    12-14 Month ICM trend:     [name], RN 02/05/2023 12:22 PM

## 2023-02-08 ENCOUNTER — Other Ambulatory Visit (HOSPITAL_COMMUNITY): Payer: Self-pay

## 2023-02-27 ENCOUNTER — Ambulatory Visit: Payer: Medicare PPO | Attending: Cardiology | Admitting: Cardiology

## 2023-02-27 ENCOUNTER — Encounter: Payer: Self-pay | Admitting: Cardiology

## 2023-02-27 VITALS — BP 110/70 | HR 66 | Ht 63.0 in | Wt 181.0 lb

## 2023-02-27 DIAGNOSIS — I4719 Other supraventricular tachycardia: Secondary | ICD-10-CM

## 2023-02-27 DIAGNOSIS — Z79899 Other long term (current) drug therapy: Secondary | ICD-10-CM

## 2023-02-27 DIAGNOSIS — Z95810 Presence of automatic (implantable) cardiac defibrillator: Secondary | ICD-10-CM | POA: Diagnosis not present

## 2023-02-27 NOTE — Patient Instructions (Signed)
Medication Instructions:  Your physician recommends that you continue on your current medications as directed. Please refer to the Current Medication list given to you today.  *If you need a refill on your cardiac medications before your next appointment, please call your pharmacy*   Lab Work: Amiodarone surveillance labs today: TSH  & LFTs  If you have labs (blood work) drawn today and your tests are completely normal, you will receive your results only by: MyChart Message (if you have MyChart) OR A paper copy in the mail If you have any lab test that is abnormal or we need to change your treatment, we will call you to review the results.   Testing/Procedures: None ordered   Follow-Up: At [facility], you and your health needs are our priority.  As part of our continuing mission to provide you with exceptional heart care, we have created designated Provider Care Teams.  These Care Teams include your primary Cardiologist (physician) and Advanced Practice Providers (APPs -  Physician Assistants and Nurse Practitioners) who all work together to provide you with the care you need, when you need it.  Remote monitoring is used to monitor your Pacemaker or ICD from home. This monitoring reduces the number of office visits required to check your device to one time per year. It allows [place] to keep an eye on the functioning of your device to ensure it is working properly. You are scheduled for a device check from home on 03/08/23. You may send your transmission at any time that day. If you have a wireless device, the transmission will be sent automatically. After your physician reviews your transmission, you will receive a postcard with your next transmission date.  Your next appointment:   1 year(s)  The format for your next appointment:   In Person  Provider:   Loman Brooklyn, MD    Thank you for choosing [facility]!!   Dory Horn, RN [phone]

## 2023-02-27 NOTE — Progress Notes (Signed)
  Electrophysiology Office Note:   Date:  02/27/2023  ID:  Michelle Andrade, DOB 31-Jan-1961, MRN [id number]  Primary Cardiologist: Arvilla Meres, MD Electrophysiologist: Regan Lemming, MD      History of Present Illness:   Michelle Andrade is a 62 y.o. female with h/o CHF seen today for routine electrophysiology followup.  Since last being seen in our clinic the patient reports doing she has had no chest pain or shortness of breath.  She continues to do all her daily activities without restriction.  she denies chest pain, palpitations, dyspnea, PND, orthopnea, nausea, vomiting, dizziness, syncope, edema, weight gain, or early satiety.     With history of hypertension, chronic systolic heart failure due to nonischemic cardiomyopathy, atrial tachycardia, CKD stage III, diabetes, OSA, CPAP.  She is post [employer] CRT-D.     Review of systems complete and found to be negative unless listed in HPI.      EP information / Studies Reviewed:    EKG is ordered today. Personal review as below.  EKG Interpretation  Date/Time:  Wednesday February 27 2023 12:24:17 EDT Ventricular Rate:  66 PR Interval:    QRS Duration: 178 QT Interval:  508 QTC Calculation: 532 R Axis:   -81 Text Interpretation: Ventricular-paced rhythm When compared with ECG of 26-Sep-2022 13:49, No significant change was found Confirmed by Shadell Brenn ([id number]) on 02/27/2023 3:27:46 PM   ICD Interrogation-  reviewed in detail today,  See PACEART report.  Device History: Abbott BiV ICD implanted for CHF History of appropriate therapy: No History of AAD therapy: No   Risk Assessment/Calculations:              Physical Exam:   VS:  BP 110/70   Pulse 66   Ht 5\' 3"  (1.6 m)   Wt 181 lb (82.1 kg)   SpO2 98%   BMI 32.06 kg/m    Wt Readings from Last 3 Encounters:  02/27/23 181 lb (82.1 kg)  09/26/22 175 lb 6.4 oz (79.6 kg)  01/22/22 171 lb (77.6 kg)     GEN: Well nourished, well developed in no acute  distress NECK: No JVD; No carotid bruits CARDIAC: Regular rate and rhythm, no murmurs, rubs, gallops RESPIRATORY:  Clear to auscultation without rales, wheezing or rhonchi  ABDOMEN: Soft, non-tender, non-distended EXTREMITIES:  No edema; No deformity   ASSESSMENT AND PLAN:    1.  Chronic systolic dysfunction s/p Abbott CRT-D  euvolemic today Stable on an appropriate medical regimen Normal ICD function See Pace Art report No changes today  2.  Coronary artery disease: Nonobstructive.  Plan per primary cardiology.  3.  Hyperlipidemia: Continue atorvastatin per primary cardiology  4.  CKD stage IIIa: Follows with nephrology  5.  Hypertension: Currently well-controlled  6.  Atrial tachycardia: Currently on amiodarone.  Disposition:   Follow up with EP APP in 12 months   Signed, Michelle Sleeper Jorja Loa, MD

## 2023-02-28 LAB — HEPATIC FUNCTION PANEL
ALT: 20 IU/L (ref 0–32)
AST: 18 IU/L (ref 0–40)
Albumin: 4.1 g/dL (ref 3.9–4.9)
Alkaline Phosphatase: 166 IU/L — ABNORMAL HIGH (ref 44–121)
Bilirubin Total: 0.2 mg/dL (ref 0.0–1.2)
Bilirubin, Direct: <0.1 mg/dL (ref 0.00–0.40)
Total Protein: 6.7 g/dL (ref 6.0–8.5)

## 2023-02-28 LAB — TSH: TSH: 2.15 u[IU]/mL (ref 0.450–4.500)

## 2023-03-01 ENCOUNTER — Encounter (HOSPITAL_COMMUNITY): Payer: Self-pay | Admitting: Internal Medicine

## 2023-03-01 ENCOUNTER — Telehealth (HOSPITAL_COMMUNITY): Payer: Self-pay | Admitting: Cardiology

## 2023-03-04 ENCOUNTER — Other Ambulatory Visit (HOSPITAL_COMMUNITY): Payer: Self-pay

## 2023-03-04 ENCOUNTER — Ambulatory Visit: Payer: Medicare PPO | Attending: Cardiology

## 2023-03-04 DIAGNOSIS — I5022 Chronic systolic (congestive) heart failure: Secondary | ICD-10-CM | POA: Diagnosis not present

## 2023-03-04 DIAGNOSIS — Z95810 Presence of automatic (implantable) cardiac defibrillator: Secondary | ICD-10-CM

## 2023-03-04 NOTE — Telephone Encounter (Signed)
Opened in error

## 2023-03-05 ENCOUNTER — Telehealth: Payer: Self-pay

## 2023-03-05 NOTE — Progress Notes (Signed)
EPIC Encounter for ICM Monitoring  Patient Name: Michelle Andrade is a 62 y.o. female Date: 03/05/2023 Primary Care Physican: Greta Doom, MD Primary Cardiologist: Bensimhon Electrophysiologist: Camnitz Bi-V Pacing: >99%          02/05/2022 Weight: 172 lbs 05/23/2022 Weight: 172 lbs 09/12/2022 Weight: 175 lbs 12/26/2022 Weight: 179 lbs 02/27/2023 Office Weight:  181 lbs                                                          Spoke with patient and heart failure questions reviewed.  Transmission results reviewed.  Pt asymptomatic for fluid accumulation.  Reports feeling well at this time and voices no complaints.     Diet:  She does not limit salt or fluid intake.     CorVue thoracic impedance suggesting fluid levels trending close to baseline.   Prescribed:  Torsemide 20 mg take 2 tablets (40 mg total) by mouth twice a day. Potassium 20 mEq take 2 tablet (40 mEq total) by mouth twice a day.   Labs: 09/26/2022 Creatinine 1.57, BUN 18, Potassium 3.9, Sodium 138, GFR 37 08/03/2022 Creatinine 1.62, BUN 20, Potassium 3.9, Sodium 140, GFR 36 04/03/2022 Creatinine 1.53, BUN 34, Potassium 3.8, Sodium 137, GFR 38 Care Everywhere A complete set of results can be found in Results Review.   Recommendations:  No changes and encouraged to call if experiencing any fluid symptoms.   Follow-up plan: ICM clinic phone appointment on 04/08/2023.   91 day device clinic remote transmission 03/08/2023.     EP/Cardiology Office Visits:    04/26/2023 with Dr Gala Romney (6 mo w/echo).   Next EP appt due  02/27/2024 with Dr Elberta Fortis (no recall).   Copy of ICM check sent to Dr. Elberta Fortis.    3 month ICM trend: 03/04/2023.    12-14 Month ICM trend:     [name], RN 03/05/2023 1:54 PM

## 2023-03-05 NOTE — Telephone Encounter (Signed)
Remote ICM transmission received.  Attempted call to patient regarding ICM remote transmission and no answer.  

## 2023-03-08 ENCOUNTER — Ambulatory Visit (INDEPENDENT_AMBULATORY_CARE_PROVIDER_SITE_OTHER): Payer: Medicare PPO

## 2023-03-08 DIAGNOSIS — I429 Cardiomyopathy, unspecified: Secondary | ICD-10-CM | POA: Diagnosis not present

## 2023-03-08 LAB — CUP PACEART REMOTE DEVICE CHECK
Battery Remaining Longevity: 61 mo
Battery Remaining Percentage: 67 %
Battery Voltage: 2.96 V
Brady Statistic AP VP Percent: 1 %
Brady Statistic AP VS Percent: 1 %
Brady Statistic AS VP Percent: 99 %
Brady Statistic AS VS Percent: 1 %
Brady Statistic RA Percent Paced: 1 %
Date Time Interrogation Session: 20240705020800
HighPow Impedance: 69 Ohm
Implantable Lead Connection Status: 753985
Implantable Lead Connection Status: 753985
Implantable Lead Connection Status: 753985
Implantable Lead Implant Date: 20150915
Implantable Lead Implant Date: 20150915
Implantable Lead Implant Date: 20150915
Implantable Lead Location: 753858
Implantable Lead Location: 753859
Implantable Lead Location: 753860
Implantable Pulse Generator Implant Date: 20220107
Lead Channel Impedance Value: 340 Ohm
Lead Channel Impedance Value: 390 Ohm
Lead Channel Impedance Value: 410 Ohm
Lead Channel Pacing Threshold Amplitude: 0.625 V
Lead Channel Pacing Threshold Amplitude: 0.75 V
Lead Channel Pacing Threshold Amplitude: 1.375 V
Lead Channel Pacing Threshold Pulse Width: 0.4 ms
Lead Channel Pacing Threshold Pulse Width: 0.4 ms
Lead Channel Pacing Threshold Pulse Width: 0.4 ms
Lead Channel Sensing Intrinsic Amplitude: 12 mV
Lead Channel Sensing Intrinsic Amplitude: 3.2 mV
Lead Channel Setting Pacing Amplitude: 1.625
Lead Channel Setting Pacing Amplitude: 2.25 V
Lead Channel Setting Pacing Amplitude: 2.375
Lead Channel Setting Pacing Pulse Width: 0.4 ms
Lead Channel Setting Pacing Pulse Width: 0.4 ms
Lead Channel Setting Sensing Sensitivity: 0.5 mV
Pulse Gen Serial Number: 111036830

## 2023-03-25 NOTE — Progress Notes (Signed)
Remote ICD transmission.   

## 2023-04-02 ENCOUNTER — Other Ambulatory Visit (HOSPITAL_BASED_OUTPATIENT_CLINIC_OR_DEPARTMENT_OTHER): Payer: Self-pay

## 2023-04-02 ENCOUNTER — Other Ambulatory Visit: Payer: Self-pay

## 2023-04-02 MED ORDER — TRULICITY 3 MG/0.5ML ~~LOC~~ SOAJ
3.0000 mg | SUBCUTANEOUS | 1 refills | Status: DC
  Filled 2023-04-02: qty 2, 28d supply, fill #0
  Filled 2023-04-04: qty 6, 84d supply, fill #0
  Filled 2023-06-27: qty 2, 28d supply, fill #1

## 2023-04-02 MED ORDER — TRULICITY 3 MG/0.5ML ~~LOC~~ SOAJ
3.0000 mg | SUBCUTANEOUS | 1 refills | Status: DC
  Filled 2023-04-02: qty 6, 84d supply, fill #0

## 2023-04-04 ENCOUNTER — Other Ambulatory Visit (HOSPITAL_COMMUNITY): Payer: Self-pay

## 2023-04-08 ENCOUNTER — Ambulatory Visit: Payer: Medicare PPO

## 2023-04-08 DIAGNOSIS — Z95810 Presence of automatic (implantable) cardiac defibrillator: Secondary | ICD-10-CM

## 2023-04-08 DIAGNOSIS — I5022 Chronic systolic (congestive) heart failure: Secondary | ICD-10-CM

## 2023-04-10 NOTE — Progress Notes (Signed)
EPIC Encounter for ICM Monitoring  Patient Name: Michelle Andrade is a 62 y.o. female Date: 04/10/2023 Primary Care Physican: Greta Doom, MD Primary Cardiologist: Bensimhon Electrophysiologist: Camnitz Bi-V Pacing: >99%          02/05/2022 Weight: 172 lbs 05/23/2022 Weight: 172 lbs 09/12/2022 Weight: 175 lbs 12/26/2022 Weight: 179 lbs 02/27/2023 Office Weight:  181 lbs                                                          Spoke with patient and heart failure questions reviewed.  Transmission results reviewed.  Pt asymptomatic for fluid accumulation.  Reports feeling well at this time and voices no complaints.     Diet:  She does not limit salt or fluid intake.     CorVue thoracic impedance suggesting normal fluid levels.   Prescribed:  Torsemide 20 mg take 2 tablets (40 mg total) by mouth twice a day. Potassium 20 mEq take 2 tablet (40 mEq total) by mouth twice a day.   Labs: 09/26/2022 Creatinine 1.57, BUN 18, Potassium 3.9, Sodium 138, GFR 37 08/03/2022 Creatinine 1.62, BUN 20, Potassium 3.9, Sodium 140, GFR 36 04/03/2022 Creatinine 1.53, BUN 34, Potassium 3.8, Sodium 137, GFR 38 Care Everywhere A complete set of results can be found in Results Review.   Recommendations:  No changes and encouraged to call if experiencing any fluid symptoms.   Follow-up plan: ICM clinic phone appointment on 05/13/2023.   91 day device clinic remote transmission 06/07/2023.     EP/Cardiology Office Visits:    04/26/2023 with Dr Gala Romney (6 mo w/echo).   Recall 02/27/2024 with Dr Elberta Fortis (no recall).   Copy of ICM check sent to Dr. Elberta Fortis.     3 month ICM trend: 04/08/2023.    12-14 Month ICM trend:     [name], RN 04/10/2023 10:56 AM

## 2023-04-18 ENCOUNTER — Other Ambulatory Visit (HOSPITAL_COMMUNITY): Payer: Self-pay

## 2023-04-25 ENCOUNTER — Other Ambulatory Visit (HOSPITAL_COMMUNITY): Payer: Self-pay

## 2023-04-25 ENCOUNTER — Telehealth (HOSPITAL_COMMUNITY): Payer: Self-pay | Admitting: Cardiology

## 2023-04-25 NOTE — Telephone Encounter (Signed)
Paxlovid is contraindicated with amiodarone use. Would recommend using Molnupiravir 800 mg every 12 hours for 5 days.

## 2023-04-25 NOTE — Telephone Encounter (Addendum)
Seen by on site/urgent care provider thru [place] [facility]. + COVID  8/21 Symptoms start 8/19 ha nausea joint pain  Urgent care provider unable to write for paxlovid-interfere with amiodarone   Given for symptoms- famotine, tylenol, and benzonate     Patient questioned if paxlovid has true interaction with meds and if so can another med be given as substitution.

## 2023-04-25 NOTE — Telephone Encounter (Signed)
Pt aware and voiced understanding -will contact PCP to assist with script of follow up urgent care provider

## 2023-04-26 ENCOUNTER — Ambulatory Visit (HOSPITAL_COMMUNITY): Payer: Medicare PPO

## 2023-04-26 ENCOUNTER — Encounter (HOSPITAL_COMMUNITY): Payer: Medicare PPO | Admitting: Internal Medicine

## 2023-05-13 ENCOUNTER — Ambulatory Visit: Payer: Medicare PPO | Attending: Cardiology

## 2023-05-13 DIAGNOSIS — I5022 Chronic systolic (congestive) heart failure: Secondary | ICD-10-CM

## 2023-05-13 DIAGNOSIS — Z95810 Presence of automatic (implantable) cardiac defibrillator: Secondary | ICD-10-CM | POA: Diagnosis not present

## 2023-05-17 NOTE — Progress Notes (Signed)
EPIC Encounter for ICM Monitoring  Patient Name: Michelle Andrade is a 62 y.o. female Date: 05/17/2023 Primary Care Physican: Greta Doom, MD Primary Cardiologist: Bensimhon Electrophysiologist: Camnitz Bi-V Pacing: >99%          02/05/2022 Weight: 172 lbs 05/23/2022 Weight: 172 lbs 09/12/2022 Weight: 175 lbs 12/26/2022 Weight: 179 lbs 02/27/2023 Office Weight:  181 lbs 05/17/2023 Weight: 171 lbs                                                          Spoke with patient and heart failure questions reviewed.  Transmission results reviewed.  Pt asymptomatic for fluid accumulation.  She tested positive for Covid in the last couple of weeks and trying to recover.   She has been drinking Gatorade and salt shaker to help get over covid.     Diet:  She does not limit salt or fluid intake.     CorVue thoracic impedance suggesting normal fluid levels with the exception of possible fluid accumulation 9/3-9/8 and possible dryness from 8/23-8/31.   Prescribed:  Torsemide 20 mg take 2 tablets (40 mg total) by mouth twice a day. Potassium 20 mEq take 2 tablet (40 mEq total) by mouth twice a day.   Labs: 04/12/2023 Creatinine 1.54, BUN 17, Potassium 4.0, Sodium 138, GFR 37 09/26/2022 Creatinine 1.57, BUN 18, Potassium 3.9, Sodium 138, GFR 37 A complete set of results can be found in Results Review.   Recommendations:  Recommendation to limit salt intake to 2000 mg daily and fluid intake to 64 oz daily.  Encouraged to call if experiencing any fluid symptoms.    Follow-up plan: ICM clinic phone appointment on 06/17/2023.   91 day device clinic remote transmission 06/07/2023.     EP/Cardiology Office Visits:    06/27/2023 with Dr Gala Romney (6 mo w/echo).   Recall 02/27/2024 with Dr Elberta Fortis (no recall).   Copy of ICM check sent to Dr. Elberta Fortis.     3 month ICM trend: 05/13/2023.    12-14 Month ICM trend:     [name], RN 05/17/2023 12:11 PM

## 2023-06-07 ENCOUNTER — Encounter: Payer: Self-pay | Admitting: Cardiology

## 2023-06-07 ENCOUNTER — Ambulatory Visit (INDEPENDENT_AMBULATORY_CARE_PROVIDER_SITE_OTHER): Payer: Medicare PPO

## 2023-06-07 DIAGNOSIS — I429 Cardiomyopathy, unspecified: Secondary | ICD-10-CM | POA: Diagnosis not present

## 2023-06-07 LAB — CUP PACEART REMOTE DEVICE CHECK
Battery Remaining Longevity: 51 mo
Battery Remaining Percentage: 64 %
Battery Voltage: 2.95 V
Brady Statistic AP VP Percent: 1.8 %
Brady Statistic AP VS Percent: 1 %
Brady Statistic AS VP Percent: 98 %
Brady Statistic AS VS Percent: 1 %
Brady Statistic RA Percent Paced: 1.7 %
Date Time Interrogation Session: 20241004022313
HighPow Impedance: 70 Ohm
Implantable Lead Connection Status: 753985
Implantable Lead Connection Status: 753985
Implantable Lead Connection Status: 753985
Implantable Lead Implant Date: 20150915
Implantable Lead Implant Date: 20150915
Implantable Lead Implant Date: 20150915
Implantable Lead Location: 753858
Implantable Lead Location: 753859
Implantable Lead Location: 753860
Implantable Pulse Generator Implant Date: 20220107
Lead Channel Impedance Value: 350 Ohm
Lead Channel Impedance Value: 400 Ohm
Lead Channel Impedance Value: 430 Ohm
Lead Channel Pacing Threshold Amplitude: 0.625 V
Lead Channel Pacing Threshold Amplitude: 0.75 V
Lead Channel Pacing Threshold Amplitude: 2.75 V
Lead Channel Pacing Threshold Pulse Width: 0.4 ms
Lead Channel Pacing Threshold Pulse Width: 0.4 ms
Lead Channel Pacing Threshold Pulse Width: 0.4 ms
Lead Channel Sensing Intrinsic Amplitude: 12 mV
Lead Channel Sensing Intrinsic Amplitude: 3.3 mV
Lead Channel Setting Pacing Amplitude: 1.625
Lead Channel Setting Pacing Amplitude: 2.25 V
Lead Channel Setting Pacing Amplitude: 3.75 V
Lead Channel Setting Pacing Pulse Width: 0.4 ms
Lead Channel Setting Pacing Pulse Width: 0.4 ms
Lead Channel Setting Sensing Sensitivity: 0.5 mV
Pulse Gen Serial Number: 111036830

## 2023-06-11 ENCOUNTER — Telehealth: Payer: Self-pay

## 2023-06-11 NOTE — Telephone Encounter (Signed)
Returned call to patient as requested by voice mail message.  She asked when her device will be adjusted.  Advised per 10/4 mychart message [employer] rep will be at the 10/24 HF clinic appointment and will adjust her device at that time.  She verbalized understanding.  Advised will check ICM fluid levels next week.

## 2023-06-14 NOTE — Progress Notes (Signed)
Remote ICD transmission.   

## 2023-06-17 ENCOUNTER — Ambulatory Visit: Payer: Medicare PPO | Attending: Cardiology

## 2023-06-17 DIAGNOSIS — Z95810 Presence of automatic (implantable) cardiac defibrillator: Secondary | ICD-10-CM | POA: Diagnosis not present

## 2023-06-17 DIAGNOSIS — I5022 Chronic systolic (congestive) heart failure: Secondary | ICD-10-CM

## 2023-06-19 ENCOUNTER — Telehealth: Payer: Self-pay

## 2023-06-19 NOTE — Progress Notes (Signed)
Spoke with patient and heart failure questions reviewed.  Transmission results reviewed.  Pt asymptomatic for fluid accumulation.  Reports feeling well at this time and voices no complaints.  She has been drinking a lot which may be cause possible fluid accumulation.  Encouraged to limit salt to 64 oz daily.

## 2023-06-19 NOTE — Telephone Encounter (Signed)
Remote ICM transmission received.  Attempted call to patient regarding ICM remote transmission and no answer.

## 2023-06-19 NOTE — Progress Notes (Signed)
EPIC Encounter for ICM Monitoring  Patient Name: Michelle Andrade is a 62 y.o. female Date: 06/19/2023 Primary Care Physican: Greta Doom, MD Primary Cardiologist: Bensimhon Electrophysiologist: Camnitz Bi-V Pacing: >99%          02/05/2022 Weight: 172 lbs 05/23/2022 Weight: 172 lbs 09/12/2022 Weight: 175 lbs 12/26/2022 Weight: 179 lbs 02/27/2023 Office Weight:  181 lbs 05/17/2023 Weight: 171 lbs                                                          Attempted call to patient and unable to reach.  Transmission reviewed.    Diet:  She does not limit salt or fluid intake.     CorVue thoracic impedance suggesting normal fluid levels with the exception of possible fluid accumulation 9/18-10/9.   Prescribed:  Torsemide 20 mg take 2 tablets (40 mg total) by mouth twice a day. Potassium 20 mEq take 2 tablet (40 mEq total) by mouth twice a day.   Labs: 04/12/2023 Creatinine 1.54, BUN 17, Potassium 4.0, Sodium 138, GFR 37 09/26/2022 Creatinine 1.57, BUN 18, Potassium 3.9, Sodium 138, GFR 37 A complete set of results can be found in Results Review.   Recommendations: Unable to reach.     Follow-up plan: ICM clinic phone appointment on 07/22/2023.   91 day device clinic remote transmission 09/06/2023.     EP/Cardiology Office Visits:    06/27/2023 with Dr Gala Romney (6 mo w/echo).   Recall 02/27/2024 with Dr Elberta Fortis (no recall).   Copy of ICM check sent to Dr. Elberta Fortis.     3 month ICM trend: 06/17/2023.    12-14 Month ICM trend:     [name], RN 06/19/2023 3:01 PM

## 2023-06-27 ENCOUNTER — Encounter (HOSPITAL_COMMUNITY): Payer: Self-pay | Admitting: Internal Medicine

## 2023-06-27 ENCOUNTER — Other Ambulatory Visit (HOSPITAL_COMMUNITY): Payer: Self-pay

## 2023-06-27 ENCOUNTER — Ambulatory Visit (HOSPITAL_COMMUNITY)
Admission: RE | Admit: 2023-06-27 | Discharge: 2023-06-27 | Disposition: A | Discharge status: Home / Self Care | Payer: Medicare PPO | Source: Ambulatory Visit | Attending: Internal Medicine | Admitting: Internal Medicine

## 2023-06-27 ENCOUNTER — Ambulatory Visit (HOSPITAL_BASED_OUTPATIENT_CLINIC_OR_DEPARTMENT_OTHER)
Admission: RE | Admit: 2023-06-27 | Discharge: 2023-06-27 | Disposition: A | Discharge status: Home / Self Care | Payer: Medicare PPO | Source: Ambulatory Visit | Attending: Internal Medicine | Admitting: Internal Medicine

## 2023-06-27 VITALS — BP 136/84 | HR 61 | Wt 163.6 lb

## 2023-06-27 DIAGNOSIS — I5022 Chronic systolic (congestive) heart failure: Secondary | ICD-10-CM

## 2023-06-27 DIAGNOSIS — E119 Type 2 diabetes mellitus without complications: Secondary | ICD-10-CM | POA: Diagnosis not present

## 2023-06-27 DIAGNOSIS — I110 Hypertensive heart disease with heart failure: Secondary | ICD-10-CM | POA: Diagnosis present

## 2023-06-27 DIAGNOSIS — I10 Essential (primary) hypertension: Secondary | ICD-10-CM | POA: Diagnosis not present

## 2023-06-27 DIAGNOSIS — I2510 Atherosclerotic heart disease of native coronary artery without angina pectoris: Secondary | ICD-10-CM | POA: Diagnosis not present

## 2023-06-27 DIAGNOSIS — I252 Old myocardial infarction: Secondary | ICD-10-CM | POA: Insufficient documentation

## 2023-06-27 DIAGNOSIS — Z95810 Presence of automatic (implantable) cardiac defibrillator: Secondary | ICD-10-CM | POA: Diagnosis not present

## 2023-06-27 LAB — ECHOCARDIOGRAM COMPLETE
Area-P 1/2: 3.31 cm2
Calc EF: 45 %
S' Lateral: 3.5 cm
Single Plane A2C EF: 45.2 %
Single Plane A4C EF: 44.5 %

## 2023-06-27 MED ORDER — SPIRONOLACTONE 25 MG PO TABS: 12.5000 mg | ORAL_TABLET | Freq: Every day | ORAL | 3 refills | Status: DC

## 2023-06-27 NOTE — Patient Instructions (Signed)
Medication Changes:  START Spironolactone 12.5 mg (1/2 tab) Daily  Lab Work:  Your physician recommends that you return for lab work in: 1 week and again in 1 month  Testing/Procedures:  none  Referrals:  none  Special Instructions // Education:  Do the following things EVERYDAY: Weigh yourself in the morning before breakfast. Write it down and keep it in a log. Take your medicines as prescribed Eat low salt foods--Limit salt (sodium) to 2000 mg per day.  Stay as active as you can everyday Limit all fluids for the day to less than 2 liters   Follow-Up in: 3 months   At the [facility], you and your health needs are our priority. We have a designated team specialized in the treatment of Heart Failure. This Care Team includes your primary Heart Failure Specialized Cardiologist (physician), Advanced Practice Providers (APPs- Physician Assistants and Nurse Practitioners), and Pharmacist who all work together to provide you with the care you need, when you need it.   You may see any of the following providers on your designated Care Team at your next follow up:  Dr. Arvilla Meres Dr. Marca Ancona Dr. Dorthula Nettles Dr. Theresia Bough Tonye Becket, NP Robbie Lis, [place] [facility] [place], [place] Brynda Peon, NP [place] Lee, NP Karle Plumber, PharmD   Please be sure to bring in all your medications bottles to every appointment.   Need to Contact us:  If you have any questions or concerns before your next appointment please send [place] a message through [place] or call our office at [phone].    TO LEAVE A MESSAGE FOR THE NURSE SELECT OPTION 2, PLEASE LEAVE A MESSAGE INCLUDING: YOUR NAME DATE OF BIRTH CALL BACK NUMBER REASON FOR CALL**this is important as we prioritize the call backs  YOU WILL RECEIVE A CALL BACK THE SAME DAY AS LONG AS YOU CALL BEFORE 4:00 PM

## 2023-06-27 NOTE — Progress Notes (Signed)
[facility] Note   HF Cardiologist: Dr. Gala Romney  Nephrology: Dr Ardeen [facility].   Reason for Visit: f/u Chronic Systolic Heart Failure  HPI:  Michelle Andrade is a 62 y.o. female with a past medical history of HTN, chronic systolic HF due to NICM (normal cors on cath 2014, nonobs CAD on cath 5/21) s/p CRT-D ([employer]), atrial tachycardia (2017), CKD stage III, DM, OSA w/CPAP.   Admitted 4/18 with ADHF at [facility]. Echo that admission with EF 25-30%.   Has angioedema with ACE-I and cough with ARB.    Echo 3/20 LVEF 25-30%   Admitted 5/21 LHC showed moderate, nonobstructive CAD w/ 50-60% RCA, flow wire 1.0 (non-flow limiting). Medical therapy recommended.  Echo 12/21 EF 25-30%   Echo 6/23 EF 35-40% (outside echo - images unavailable)  ICD changed in 1/22.  Today she returns for HF follow up. Feels good but says she gets SOB with inclines and if she tries to walk fast. No edema, orthopnea or PND. Follows with Randon Goldsmith in [facility] clinic. + Bendopnea. Has lost 20 pound with Trulicity  Echo today 06/27/23 EF 35-40% Personally reviewed   Review of systems complete and found to be negative unless listed in HPI.    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   CAD (coronary artery disease) 09/01/2013   CHF (congestive heart failure) (HCC) 06/07/2015   CKD (chronic kidney disease), stage III (HCC) 12/09/2013   Depression 07/27/2013   Diabetes mellitus without complication (HCC) 09/27/2014   GERD (gastroesophageal reflux disease) 07/27/2013   Gout 12/29/2013   Heart valve disorder 06/03/2013   High cholesterol 11/10/2013   Hypertension 11/10/2013   IBS (irritable bowel syndrome) 09/27/2014   Myocardial infarct [facility])    Sleep apnea 10/13/2013   Vascular disorder 01/12/2014   Current Outpatient Medications  Medication Sig Dispense Refill    allopurinol (ZYLOPRIM) 100 MG tablet Take 200 mg by mouth daily.      amiodarone (PACERONE) 200 MG tablet Take 0.5 tablets (100 mg total) by mouth daily. 90 tablet 3   aspirin 81 MG chewable tablet Chew 81 mg by mouth daily.     atorvastatin (LIPITOR) 80 MG tablet Take 1 tablet (80 mg total) by mouth at bedtime. 90 tablet 3   Calcium Carbonate-Vitamin D 600-400 MG-UNIT tablet Take 1 tablet by mouth 2 (two) times daily.     carvedilol (COREG) 25 MG tablet Take 1 tablet (25 mg total) by mouth 2 (two) times daily with a meal. 180 tablet 3   colchicine 0.6 MG tablet Take 0.6 mg by mouth 2 (two) times daily as needed (Gout).     cyclobenzaprine (FLEXERIL) 5 MG tablet Take 5 mg by mouth 3 (three) times daily as needed for muscle spasms.     diclofenac Sodium (VOLTAREN ARTHRITIS PAIN) 1 % GEL Apply 1 g topically as needed.     Dulaglutide (TRULICITY) 3 MG/0.5ML SOPN Inject 3 mg under the skin once a week. 6 mL 1   empagliflozin (JARDIANCE) 10 MG TABS tablet Take 1 tablet (10 mg total) by mouth daily before breakfast. 90 tablet 3   ferrous sulfate 325 (65 FE) MG tablet Take 1 tablet (325 mg total) by mouth 3 (three) times daily. 30 tablet 0   hydrALAZINE (APRESOLINE) 25 MG tablet Take 1 tablet (25 mg total) by mouth 3 (three) times daily. 180 tablet 3   isosorbide mononitrate (  IMDUR) 30 MG 24 hr tablet Take 1 tablet (30 mg total) by mouth daily. 90 tablet 3   Magnesium Oxide 420 MG TABS Take 840 mg by mouth daily.     Multiple Vitamin (MULTIVITAMIN) tablet Take 1 tablet by mouth daily.     omeprazole (PRILOSEC) 40 MG capsule Take 40 mg by mouth daily.     potassium chloride SA (KLOR-CON M) 20 MEQ tablet TAKE 2 TABLETS BY MOUTH TWICE DAILY 120 tablet 0   rOPINIRole (REQUIP) 0.5 MG tablet Take 0.5 mg by mouth at bedtime.      sertraline (ZOLOFT) 100 MG tablet Patient takes 2 tablet at bedtime.     torsemide (DEMADEX) 20 MG tablet Take 2 tablets (40 mg total) by mouth 2 (two) times daily. 112 tablet 5    No current facility-administered medications for this encounter.   Allergies  Allergen Reactions   Codeine Itching   Gabapentin Itching   Haldol [Haloperidol Lactate] Other (See Comments)    Dizziness, hallucinations   Levofloxacin     Other reaction(s): Other (See Comments) Projectile vomiting    Lisinopril Itching and Swelling   Losartan Itching   Morphine And Codeine Nausea And Vomiting   Penicillins Nausea And Vomiting    "Vomiting, upset stomach, Headache" Has patient had a PCN reaction causing immediate rash, facial/tongue/throat swelling, SOB or lightheadedness with hypotension: No Has patient had a PCN reaction causing severe rash involving mucus membranes or skin necrosis: No Has patient had a PCN reaction that required hospitalization: No Has patient had a PCN reaction occurring within the last 10 years: No If all of the above answers are "NO", then may proceed with Cephalosporin use.    Haloperidol Other (See Comments)   Other Other (See Comments)   Oxycodone Other (See Comments)   Social History   Socioeconomic History   Marital status: Single    Spouse name: Not on file   Number of children: Not on file   Years of education: Not on file   Highest education level: Not on file  Occupational History   Not on file  Tobacco Use   Smoking status: Never   Smokeless tobacco: Never  Vaping Use   Vaping status: Never Used  Substance and Sexual Activity   Alcohol use: No   Drug use: No   Sexual activity: Yes  Other Topics Concern   Not on file  Social History Narrative   Not on file   Social Determinants of Health   Financial Resource Strain: Not on file  Food Insecurity: Not on file  Transportation Needs: Not on file  Physical Activity: Not on file  Stress: Not on file  Social Connections: Not on file  Intimate Partner Violence: Not on file   Family History  Problem Relation Age of Onset   Hypertension Mother    BP 136/84   Pulse 61   Wt 74.2  kg (163 lb 9.6 oz)   SpO2 97%   BMI 28.98 kg/m   Wt Readings from Last 3 Encounters:  06/27/23 74.2 kg (163 lb 9.6 oz)  02/27/23 82.1 kg (181 lb)  09/26/22 79.6 kg (175 lb 6.4 oz)    PHYSICAL EXAM: General:  Well appearing. No resp difficulty HEENT: normal Neck: supple. no JVD. Carotids 2+ bilat; no bruits. No lymphadenopathy or thryomegaly appreciated. Cor: PMI nondisplaced. Regular rate & rhythm. No rubs, gallops or murmurs. Lungs: clear Abdomen: soft, nontender, nondistended. No hepatosplenomegaly. No bruits or masses. Good bowel sounds. Extremities: no  cyanosis, clubbing, rash, edema Neuro: alert & orientedx3, cranial nerves grossly intact. moves all 4 extremities w/o difficulty. Affect pleasant   ICD interrogation (personally reviewed): No VT/AF. BiV > 99%. Fluid was up in September but now better. Personally reviewed   ASSESSMENT & PLAN: 1. Chronic systolic CHF:  - NICM, normal cors in 2014, likely related to HTN vs. Noncompliance vs. Tachy- medicated with history of atrial tach. - s/p STJ CRT-D - Echo 02/2017 EF 20%, moderate MR, moderate TR. PA pressure 80 mm Hg.    - Echo 11/2018 EF 25-30% - Cath 5/21: Nonobstructive CAD RCA 60% lesion - Echo 12/21 EF 25-30% RV ok  - Echo 6/23 EF 35-40% (outside echo - images unavailable) - Echo today 06/27/23 EF 35-40% Personally reviewed - Stable NYHA II-III. Volume ok - No ARB/ ANRI due to angioedema/CKD. Had itching with losartan - Will try low-dose spiro. Watch labs  - Continue hydralazine 25 mg tid -> dropped from 50 due to low BP and dizziness  - Continue torsemide 40 mg bid. - Continue Imdur 30 mg daily. - Continue carvedilol 25 mg bid.  - Continue Jardiance 10 mg daily - Labs from 8/24 reviewed K 4.0 Scr 1.5  2. CAD, non-obstructive - LHC 01/2020 50-60% RCA, negative FFR (non-flow limiting). - No s/s angina - Continue medical therapy w/ ASA + Statin+ Imdur + ? blocker.  3. HLD - Continue atorva 80. - Goal LDL < 70. -  Followed by PCP.  4. CKD stage IIIa  - Baseline creatinine 1.3-1.6.  - Follows with Dr. Jeanmarie Plant. - - Labs from 8/24 reviewed K 4.0 Scr 1.5  5. HTN - BP improved  6. History of atrial tachycardia - Followed by Dr. Elberta Fortis  - Continue amiodarone 100 mg daily   Arvilla Meres, MD 06/27/23

## 2023-07-05 ENCOUNTER — Ambulatory Visit (HOSPITAL_COMMUNITY)
Admission: RE | Admit: 2023-07-05 | Discharge: 2023-07-05 | Disposition: A | Discharge status: Home / Self Care | Payer: Medicare PPO | Source: Ambulatory Visit | Attending: Internal Medicine | Admitting: Internal Medicine

## 2023-07-05 DIAGNOSIS — I5022 Chronic systolic (congestive) heart failure: Secondary | ICD-10-CM | POA: Diagnosis present

## 2023-07-05 LAB — BASIC METABOLIC PANEL
Anion gap: 10 (ref 5–15)
BUN: 17 mg/dL (ref 8–23)
CO2: 24 mmol/L (ref 22–32)
Calcium: 9.2 mg/dL (ref 8.9–10.3)
Chloride: 104 mmol/L (ref 98–111)
Creatinine, Ser: 1.87 mg/dL — ABNORMAL HIGH (ref 0.44–1.00)
GFR, Estimated: 30 mL/min — ABNORMAL LOW (ref >60)
Glucose, Bld: 172 mg/dL — ABNORMAL HIGH (ref 70–99)
Potassium: 4.3 mmol/L (ref 3.5–5.1)
Sodium: 138 mmol/L (ref 135–145)

## 2023-07-22 ENCOUNTER — Ambulatory Visit: Payer: Medicare PPO | Attending: Cardiology

## 2023-07-22 DIAGNOSIS — Z95810 Presence of automatic (implantable) cardiac defibrillator: Secondary | ICD-10-CM

## 2023-07-22 DIAGNOSIS — I5022 Chronic systolic (congestive) heart failure: Secondary | ICD-10-CM | POA: Diagnosis not present

## 2023-07-23 ENCOUNTER — Encounter: Payer: Self-pay | Admitting: Internal Medicine

## 2023-07-23 NOTE — Progress Notes (Signed)
EPIC Encounter for ICM Monitoring  Patient Name: Michelle Andrade is a 62 y.o. female Date: 07/23/2023 Primary Care Physican: Greta Doom, MD Primary Cardiologist: Bensimhon Electrophysiologist: Camnitz Bi-V Pacing: >99%          02/05/2022 Weight: 172 lbs 05/23/2022 Weight: 172 lbs 09/12/2022 Weight: 175 lbs 12/26/2022 Weight: 179 lbs 02/27/2023 Office Weight:  181 lbs 05/17/2023 Weight: 171 lbs 07/23/2023 Weight: 156 lbs                                                          Spoke with patient and heart failure questions reviewed.  Transmission results reviewed.  Pt asymptomatic for fluid accumulation.  Reports feeling well at this time and voices no complaints.     Diet:  She does not limit salt or fluid intake.     CorVue thoracic impedance suggesting normal fluid levels with the exception of possible fluid accumulation 11/15-11/18.   Prescribed:  Torsemide 20 mg take 2 tablets (40 mg total) by mouth twice a day. Potassium 20 mEq take 2 tablet (40 mEq total) by mouth twice a day. Spironolactone 25 mg take 0.5 tablet (12.5 mg total) by mouth daily   Labs: 08/05/2023 BMET scheduled at HF clinic 07/05/2023 Creatinine 1.87, BUN 17, Potassium 4.3, Sodium 138, GFR 30 04/12/2023 Creatinine 1.54, BUN 17, Potassium 4.0, Sodium 138, GFR 37 09/26/2022 Creatinine 1.57, BUN 18, Potassium 3.9, Sodium 138, GFR 37 A complete set of results can be found in Results Review.   Recommendations:  No changes and encouraged to call if experiencing any fluid symptoms.   Follow-up plan: ICM clinic phone appointment on 08/26/2023.   91 day device clinic remote transmission 09/06/2023.     EP/Cardiology Office Visits:  09/27/2023 with HF clinic.   Recall 02/27/2024 with Dr Elberta Fortis (no recall).   Copy of ICM check sent to Dr. Elberta Fortis.     3 month ICM trend: 07/22/2023.    12-14 Month ICM trend:     [name], RN 07/23/2023 7:30 AM

## 2023-07-25 ENCOUNTER — Other Ambulatory Visit (HOSPITAL_COMMUNITY): Payer: Self-pay

## 2023-07-25 MED ORDER — TRULICITY 1.5 MG/0.5ML ~~LOC~~ SOAJ
1.5000 mg | SUBCUTANEOUS | 5 refills | Status: DC
  Filled 2023-07-25: qty 2, 28d supply, fill #0
  Filled 2023-08-20: qty 2, 28d supply, fill #1
  Filled 2023-10-05: qty 2, 28d supply, fill #2
  Filled 2023-10-28: qty 2, 28d supply, fill #3
  Filled 2023-11-25 (×2): qty 2, 28d supply, fill #4
  Filled 2023-12-23: qty 2, 28d supply, fill #5

## 2023-07-26 ENCOUNTER — Other Ambulatory Visit (HOSPITAL_COMMUNITY): Payer: Self-pay

## 2023-08-05 ENCOUNTER — Encounter (HOSPITAL_COMMUNITY): Payer: Self-pay | Admitting: Internal Medicine

## 2023-08-05 ENCOUNTER — Ambulatory Visit (HOSPITAL_COMMUNITY)
Admission: RE | Admit: 2023-08-05 | Discharge: 2023-08-05 | Disposition: A | Discharge status: Home / Self Care | Payer: Medicare PPO | Source: Ambulatory Visit | Attending: Cardiology | Admitting: Cardiology

## 2023-08-05 DIAGNOSIS — I5022 Chronic systolic (congestive) heart failure: Secondary | ICD-10-CM | POA: Diagnosis present

## 2023-08-05 LAB — BASIC METABOLIC PANEL
Anion gap: 9 (ref 5–15)
BUN: 18 mg/dL (ref 8–23)
CO2: 23 mmol/L (ref 22–32)
Calcium: 9.2 mg/dL (ref 8.9–10.3)
Chloride: 109 mmol/L (ref 98–111)
Creatinine, Ser: 1.88 mg/dL — ABNORMAL HIGH (ref 0.44–1.00)
GFR, Estimated: 29 mL/min — ABNORMAL LOW (ref >60)
Glucose, Bld: 158 mg/dL — ABNORMAL HIGH (ref 70–99)
Potassium: 4.4 mmol/L (ref 3.5–5.1)
Sodium: 141 mmol/L (ref 135–145)

## 2023-08-20 ENCOUNTER — Encounter (HOSPITAL_COMMUNITY): Payer: Self-pay | Admitting: Internal Medicine

## 2023-08-23 ENCOUNTER — Other Ambulatory Visit (HOSPITAL_COMMUNITY): Payer: Self-pay

## 2023-08-26 ENCOUNTER — Ambulatory Visit: Payer: Medicare PPO | Attending: Cardiology

## 2023-08-26 DIAGNOSIS — I5022 Chronic systolic (congestive) heart failure: Secondary | ICD-10-CM | POA: Diagnosis not present

## 2023-08-26 DIAGNOSIS — Z95810 Presence of automatic (implantable) cardiac defibrillator: Secondary | ICD-10-CM | POA: Diagnosis not present

## 2023-08-29 NOTE — Progress Notes (Signed)
EPIC Encounter for ICM Monitoring  Patient Name: Michelle Andrade is a 62 y.o. female Date: 08/29/2023 Primary Care Physican: Greta Doom, MD Primary Cardiologist: Bensimhon Electrophysiologist: Camnitz Bi-V Pacing: >99%          02/05/2022 Weight: 172 lbs 05/23/2022 Weight: 172 lbs 09/12/2022 Weight: 175 lbs 12/26/2022 Weight: 179 lbs 02/27/2023 Office Weight:  181 lbs 05/17/2023 Weight: 171 lbs 07/23/2023 Weight: 156 lbs                                                          Spoke with patient and heart failure questions reviewed.  Transmission results reviewed.  Pt asymptomatic for fluid accumulation.  Reports feeling well at this time and voices no complaints.     Diet:  She does not limit salt or fluid intake.     CorVue thoracic impedance suggesting normal fluid levels with the exception of possible fluid accumulation 11/18-12/7.   Prescribed:  Torsemide 20 mg take 2 tablets (40 mg total) by mouth twice a day. Potassium 20 mEq take 2 tablet (40 mEq total) by mouth twice a day.   Labs: 08/26/2023 Creatinine 1.98, BUN 25, Potassium 4.4, Sodium 139, GFR 28 08/19/2023 Creatinine 2.11, BUN 29, Potassium 5.8, Sodium 136, GFR 26 08/05/2023 Creatinine 1.88, BUN 18, Potassium 4.4, Sodium 141, GFR 29 07/05/2023 Creatinine 1.87, BUN 17, Potassium 4.3, Sodium 138, GFR 30 04/12/2023 Creatinine 1.54, BUN 17, Potassium 4.0, Sodium 138, GFR 37 09/26/2022 Creatinine 1.57, BUN 18, Potassium 3.9, Sodium 138, GFR 37 A complete set of results can be found in Results Review.   Recommendations:  No changes and encouraged to call if experiencing any fluid symptoms.   Follow-up plan: ICM clinic phone appointment on 09/30/2023.   91 day device clinic remote transmission 09/06/2023.     EP/Cardiology Office Visits:  09/27/2023 with HF clinic.   Recall 02/27/2024 with Dr Elberta Fortis (no recall).  Nephrologist monitoring kidney function.   Copy of ICM check sent to Dr. Elberta Fortis.     3 month ICM trend:  08/26/2023.    12-14 Month ICM trend:     [name], RN 08/29/2023 3:20 PM

## 2023-09-06 ENCOUNTER — Ambulatory Visit (INDEPENDENT_AMBULATORY_CARE_PROVIDER_SITE_OTHER): Payer: Medicare Other

## 2023-09-06 DIAGNOSIS — I429 Cardiomyopathy, unspecified: Secondary | ICD-10-CM | POA: Diagnosis not present

## 2023-09-06 LAB — CUP PACEART REMOTE DEVICE CHECK
Battery Remaining Longevity: 49 mo
Battery Remaining Percentage: 60 %
Battery Voltage: 2.96 V
Brady Statistic AP VP Percent: 3.1 %
Brady Statistic AP VS Percent: 1 %
Brady Statistic AS VP Percent: 97 %
Brady Statistic AS VS Percent: 1 %
Brady Statistic RA Percent Paced: 2.9 %
Date Time Interrogation Session: 20250103021635
HighPow Impedance: 69 Ohm
Implantable Lead Connection Status: 753985
Implantable Lead Connection Status: 753985
Implantable Lead Connection Status: 753985
Implantable Lead Implant Date: 20150915
Implantable Lead Implant Date: 20150915
Implantable Lead Implant Date: 20150915
Implantable Lead Location: 753858
Implantable Lead Location: 753859
Implantable Lead Location: 753860
Implantable Pulse Generator Implant Date: 20220107
Lead Channel Impedance Value: 390 Ohm
Lead Channel Impedance Value: 430 Ohm
Lead Channel Impedance Value: 450 Ohm
Lead Channel Pacing Threshold Amplitude: 0.625 V
Lead Channel Pacing Threshold Amplitude: 0.75 V
Lead Channel Pacing Threshold Amplitude: 1.625 V
Lead Channel Pacing Threshold Pulse Width: 0.4 ms
Lead Channel Pacing Threshold Pulse Width: 0.4 ms
Lead Channel Pacing Threshold Pulse Width: 0.4 ms
Lead Channel Sensing Intrinsic Amplitude: 10.8 mV
Lead Channel Sensing Intrinsic Amplitude: 5 mV
Lead Channel Setting Pacing Amplitude: 1.625
Lead Channel Setting Pacing Amplitude: 2.25 V
Lead Channel Setting Pacing Amplitude: 2.625
Lead Channel Setting Pacing Pulse Width: 0.4 ms
Lead Channel Setting Pacing Pulse Width: 0.4 ms
Lead Channel Setting Sensing Sensitivity: 0.5 mV
Pulse Gen Serial Number: 111036830

## 2023-09-24 NOTE — Progress Notes (Signed)
[facility] Note  PCP: Greta Doom, MD HF Cardiologist: Dr. Gala Romney  Nephrology: Dr Elenore Rota ([facility])   CC: HF follow up  HPI:  Michelle Andrade is a 63 y.o. female with a past medical history of HTN, chronic systolic HF due to NICM (normal cors on cath 2014, nonobs CAD on cath 5/21) s/p CRT-D ([employer]), atrial tachycardia (2017), CKD stage III, DM, OSA w/CPAP.   Admitted 4/18 with ADHF at [facility]. Echo that admission with EF 25-30%.   Has angioedema with ACE-I and cough with ARB.    Echo 3/20 LVEF 25-30%   Admitted 5/21 LHC showed moderate, nonobstructive CAD w/ 50-60% RCA, flow wire 1.0 (non-flow limiting). Medical therapy recommended.  Echo 12/21 EF 25-30%   Echo 6/23 EF 35-40% (outside echo - images unavailable)  ICD changed in 1/22.  Echo 06/27/23 EF 35-40%   Low dose spiro started 10/24 but SCr 1.5>>2.1 and K 5.8. Cleda Daub stopped.   Today she returns for HF follow up. Overall feeling fine. She has SOB lifting heavy objects, + bendopnea. She has occasional positional dizziness. Denies palpitations, CP, edema, or PND/Orthopnea. Appetite ok. No fever or chills. Weight at home 159 pounds. Taking all medications. Losing weight on Trulicity.   Review of systems complete and found to be negative unless listed in HPI.    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   CAD (coronary artery disease) 09/01/2013   CHF (congestive heart failure) (HCC) 06/07/2015   CKD (chronic kidney disease), stage III (HCC) 12/09/2013   Depression 07/27/2013   Diabetes mellitus without complication (HCC) 09/27/2014   GERD (gastroesophageal reflux disease) 07/27/2013   Gout 12/29/2013   Heart valve disorder 06/03/2013   High cholesterol 11/10/2013   Hypertension 11/10/2013   IBS (irritable bowel syndrome) 09/27/2014   Myocardial infarct [facility])    Sleep apnea  10/13/2013   Vascular disorder 01/12/2014   Current Outpatient Medications  Medication Sig Dispense Refill   allopurinol (ZYLOPRIM) 100 MG tablet Take 200 mg by mouth daily.      amiodarone (PACERONE) 200 MG tablet Take 0.5 tablets (100 mg total) by mouth daily. 90 tablet 3   aspirin 81 MG chewable tablet Chew 81 mg by mouth daily.     atorvastatin (LIPITOR) 80 MG tablet Take 1 tablet (80 mg total) by mouth at bedtime. 90 tablet 3   Calcium Carbonate-Vitamin D 600-400 MG-UNIT tablet Take 1 tablet by mouth 2 (two) times daily.     carvedilol (COREG) 25 MG tablet Take 1 tablet (25 mg total) by mouth 2 (two) times daily with a meal. 180 tablet 3   colchicine 0.6 MG tablet Take 0.6 mg by mouth 2 (two) times daily as needed (Gout).     cyclobenzaprine (FLEXERIL) 5 MG tablet Take 5 mg by mouth 3 (three) times daily as needed for muscle spasms.     diclofenac Sodium (VOLTAREN ARTHRITIS PAIN) 1 % GEL Apply 1 g topically as needed.     Dulaglutide (TRULICITY) 1.5 MG/0.5ML SOAJ Inject 1.5 mg into the skin every 7 (seven) days. 2 mL 5   Dulaglutide (TRULICITY) 3 MG/0.5ML SOAJ Inject 3 mg under the skin once a week. (Patient taking differently: Take 1.5 mg by mouth once a week. Sundays) 6 mL 1   empagliflozin (JARDIANCE) 10 MG TABS tablet Take 1 tablet (10 mg total) by mouth daily before breakfast. 90 tablet 3  ferrous sulfate 325 (65 FE) MG tablet Take 1 tablet (325 mg total) by mouth 3 (three) times daily. 30 tablet 0   hydrALAZINE (APRESOLINE) 25 MG tablet Take 1 tablet (25 mg total) by mouth 3 (three) times daily. 180 tablet 3   isosorbide mononitrate (IMDUR) 30 MG 24 hr tablet Take 1 tablet (30 mg total) by mouth daily. 90 tablet 3   Magnesium Oxide 420 MG TABS Take 840 mg by mouth daily.     Multiple Vitamin (MULTIVITAMIN) tablet Take 1 tablet by mouth daily.     omeprazole (PRILOSEC) 40 MG capsule Take 40 mg by mouth daily.     potassium chloride SA (KLOR-CON M) 20 MEQ tablet TAKE 2 TABLETS BY  MOUTH TWICE DAILY 120 tablet 0   rOPINIRole (REQUIP) 0.5 MG tablet Take 0.5 mg by mouth at bedtime.      sertraline (ZOLOFT) 100 MG tablet Patient takes 2 tablet at bedtime.     torsemide (DEMADEX) 20 MG tablet Take 2 tablets (40 mg total) by mouth 2 (two) times daily. 112 tablet 5   No current facility-administered medications for this encounter.   Allergies  Allergen Reactions   Codeine Itching   Gabapentin Itching   Haldol [Haloperidol Lactate] Other (See Comments)    Dizziness, hallucinations   Levofloxacin     Other reaction(s): Other (See Comments) Projectile vomiting    Lisinopril Itching and Swelling   Losartan Itching   Morphine And Codeine Nausea And Vomiting   Penicillins Nausea And Vomiting    "Vomiting, upset stomach, Headache" Has patient had a PCN reaction causing immediate rash, facial/tongue/throat swelling, SOB or lightheadedness with hypotension: No Has patient had a PCN reaction causing severe rash involving mucus membranes or skin necrosis: No Has patient had a PCN reaction that required hospitalization: No Has patient had a PCN reaction occurring within the last 10 years: No If all of the above answers are "NO", then may proceed with Cephalosporin use.    Haloperidol Other (See Comments)   Other Other (See Comments)   Oxycodone Other (See Comments)   Social History   Socioeconomic History   Marital status: Single    Spouse name: Not on file   Number of children: Not on file   Years of education: Not on file   Highest education level: Not on file  Occupational History   Not on file  Tobacco Use   Smoking status: Never   Smokeless tobacco: Never  Vaping Use   Vaping status: Never Used  Substance and Sexual Activity   Alcohol use: No   Drug use: No   Sexual activity: Yes  Other Topics Concern   Not on file  Social History Narrative   Not on file   Social Drivers of Health   Financial Resource Strain: Not on file  Food Insecurity: Not on  file  Transportation Needs: Not on file  Physical Activity: Not on file  Stress: Not on file  Social Connections: Not on file  Intimate Partner Violence: Not on file   Family History  Problem Relation Age of Onset   Hypertension Mother    BP 114/62   Pulse 67   Wt 72.2 kg (159 lb 3.2 oz)   SpO2 100%   BMI 28.20 kg/m   Wt Readings from Last 3 Encounters:  09/27/23 72.2 kg (159 lb 3.2 oz)  06/27/23 74.2 kg (163 lb 9.6 oz)  02/27/23 82.1 kg (181 lb)    PHYSICAL EXAM: General:  NAD. No  resp difficulty, walked into clinic HEENT: Normal Neck: Supple. No JVD. Carotids 2+ bilat; no bruits. No lymphadenopathy or thryomegaly appreciated. Cor: PMI nondisplaced. Regular rate & rhythm. No rubs, gallops or murmurs. Lungs: Clear Abdomen: Soft, nontender, nondistended. No hepatosplenomegaly. No bruits or masses. Good bowel sounds. Extremities: No cyanosis, clubbing, rash, edema Neuro: Alert & oriented x 3, cranial nerves grossly intact. Moves all 4 extremities w/o difficulty. Affect pleasant.  ICD interrogation (personally reviewed): Fluid recently up but now corrected, >99% BiV pacing, no VT or AF  ECG (personally reviewed): BiV paced  ASSESSMENT & PLAN: 1. Chronic systolic CHF:  - NICM, normal cors in 2014, likely related to HTN vs. Noncompliance vs. Tachy- medicated with history of atrial tach. - s/p STJ CRT-D - Echo 6/18 EF 20%, moderate MR, moderate TR. PA pressure 80 mm Hg.    - Echo 3/20 EF 25-30% - Cath 5/21: Nonobstructive CAD RCA 60% lesion - Echo 12/21 EF 25-30% RV ok  - Echo 6/23 EF 35-40% (outside echo - images unavailable) - Echo 10/24 EF 35-40%  - Stable NYHA II-early III. Volume ok on exam and CorVue - No ARB/ ANRi due to angioedema/CKD. Had itching with losartan - Off spiro with hyperkalemia and AKI - Continue hydralazine 25 mg tid (dizzy and low BP on 50 tid) - Continue torsemide 40 mg bid. - Continue Imdur 30 mg daily. - Continue carvedilol 25 mg bid.  -  Continue Jardiance 10 mg daily - Labs from 08/26/23 reviewed, K 4.4, SCr 1.98  2. CAD, non-obstructive - LHC 01/2020 50-60% RCA, negative FFR (non-flow limiting). - No s/s angina - Continue  ASA + statin + Imdur + ? blocker.  3. HLD - Continue atorva 80. - Goal LDL < 70. - Followed by PCP.  4. CKD stage IIIa  - Baseline creatinine 1.5-1.8 - Follows with Dr. Elenore Rota - Labs from 12/24 reviewed K 4.4 Scr 1.98  5. HTN - BP improved - No change  6. History of atrial tachycardia - Followed by Dr. Elberta Fortis  - Continue amiodarone 100 mg daily  Follow up in 3-4 months with APP.  Anderson Malta Vancleave, [name] 09/27/23

## 2023-09-27 ENCOUNTER — Encounter (HOSPITAL_COMMUNITY): Payer: Self-pay

## 2023-09-27 ENCOUNTER — Ambulatory Visit (HOSPITAL_COMMUNITY)
Admission: RE | Admit: 2023-09-27 | Discharge: 2023-09-27 | Disposition: A | Discharge status: Home / Self Care | Payer: Medicare PPO | Source: Ambulatory Visit | Attending: Family Medicine | Admitting: Family Medicine

## 2023-09-27 VITALS — BP 114/62 | HR 67 | Wt 159.2 lb

## 2023-09-27 DIAGNOSIS — E1122 Type 2 diabetes mellitus with diabetic chronic kidney disease: Secondary | ICD-10-CM | POA: Diagnosis not present

## 2023-09-27 DIAGNOSIS — I5022 Chronic systolic (congestive) heart failure: Secondary | ICD-10-CM | POA: Insufficient documentation

## 2023-09-27 DIAGNOSIS — I130 Hypertensive heart and chronic kidney disease with heart failure and stage 1 through stage 4 chronic kidney disease, or unspecified chronic kidney disease: Secondary | ICD-10-CM | POA: Diagnosis not present

## 2023-09-27 DIAGNOSIS — I4719 Other supraventricular tachycardia: Secondary | ICD-10-CM | POA: Diagnosis not present

## 2023-09-27 DIAGNOSIS — Z7985 Long-term (current) use of injectable non-insulin antidiabetic drugs: Secondary | ICD-10-CM | POA: Diagnosis not present

## 2023-09-27 DIAGNOSIS — N183 Chronic kidney disease, stage 3 unspecified: Secondary | ICD-10-CM

## 2023-09-27 DIAGNOSIS — Z79899 Other long term (current) drug therapy: Secondary | ICD-10-CM | POA: Diagnosis not present

## 2023-09-27 DIAGNOSIS — E785 Hyperlipidemia, unspecified: Secondary | ICD-10-CM | POA: Diagnosis not present

## 2023-09-27 DIAGNOSIS — I428 Other cardiomyopathies: Secondary | ICD-10-CM | POA: Insufficient documentation

## 2023-09-27 DIAGNOSIS — N1831 Chronic kidney disease, stage 3a: Secondary | ICD-10-CM | POA: Insufficient documentation

## 2023-09-27 DIAGNOSIS — Z7984 Long term (current) use of oral hypoglycemic drugs: Secondary | ICD-10-CM | POA: Insufficient documentation

## 2023-09-27 DIAGNOSIS — I2510 Atherosclerotic heart disease of native coronary artery without angina pectoris: Secondary | ICD-10-CM | POA: Diagnosis not present

## 2023-09-27 DIAGNOSIS — R0602 Shortness of breath: Secondary | ICD-10-CM | POA: Insufficient documentation

## 2023-09-27 DIAGNOSIS — I10 Essential (primary) hypertension: Secondary | ICD-10-CM

## 2023-09-27 NOTE — Patient Instructions (Signed)
There has been no changes to your medications.  Your physician recommends that you schedule a follow-up appointment in: 3 months.  If you have any questions or concerns before your next appointment please send [place] a message through [place] or call our office at [phone].    TO LEAVE A MESSAGE FOR THE NURSE SELECT OPTION 2, PLEASE LEAVE A MESSAGE INCLUDING: YOUR NAME DATE OF BIRTH CALL BACK NUMBER REASON FOR CALL**this is important as we prioritize the call backs  YOU WILL RECEIVE A CALL BACK THE SAME DAY AS LONG AS YOU CALL BEFORE 4:00 PM At the [facility], you and your health needs are our priority. As part of our continuing mission to provide you with exceptional heart care, we have created designated Provider Care Teams. These Care Teams include your primary Cardiologist (physician) and Advanced Practice Providers (APPs- Physician Assistants and Nurse Practitioners) who all work together to provide you with the care you need, when you need it.   You may see any of the following providers on your designated Care Team at your next follow up: Dr Arvilla Meres Dr Marca Ancona Dr. Dorthula Nettles Dr. Clearnce Hasten Amy Filbert Schilder, NP Robbie Lis, [place] [facility] [place], [place] Brynda Peon, NP [place] Lee, NP Karle Plumber, PharmD   Please be sure to bring in all your medications bottles to every appointment.    Thank you for choosing  [facility]

## 2023-09-30 ENCOUNTER — Ambulatory Visit: Payer: Medicare PPO | Attending: Cardiology

## 2023-09-30 DIAGNOSIS — Z95810 Presence of automatic (implantable) cardiac defibrillator: Secondary | ICD-10-CM | POA: Diagnosis not present

## 2023-09-30 DIAGNOSIS — I5022 Chronic systolic (congestive) heart failure: Secondary | ICD-10-CM

## 2023-10-02 NOTE — Progress Notes (Signed)
EPIC Encounter for ICM Monitoring  Patient Name: Michelle Andrade is a 63 y.o. female Date: 10/02/2023 Primary Care Physican: Greta Doom, MD Primary Cardiologist: Bensimhon Electrophysiologist: Camnitz Bi-V Pacing: >99%          02/05/2022 Weight: 172 lbs 05/23/2022 Weight: 172 lbs 09/12/2022 Weight: 175 lbs 12/26/2022 Weight: 179 lbs 02/27/2023 Office Weight:  181 lbs 05/17/2023 Weight: 171 lbs 07/23/2023 Weight: 156 lbs 10/02/2023 Weight: 159 lbs                                                          Spoke with patient and heart failure questions reviewed.  Transmission results reviewed.  Pt asymptomatic for fluid accumulation.  Reports feeling well at this time and voices no complaints.       Diet:  She does not limit salt or fluid intake.     CorVue thoracic impedance suggesting normal fluid levels with the exception of possible fluid accumulation 1/5-1/17.   Prescribed:  Torsemide 20 mg take 2 tablets (40 mg total) by mouth twice a day. Potassium 20 mEq take 2 tablet (40 mEq total) by mouth twice a day.   Labs: 08/26/2023 Creatinine 1.98, BUN 25, Potassium 4.4, Sodium 139, GFR 28 08/19/2023 Creatinine 2.11, BUN 29, Potassium 5.8, Sodium 136, GFR 26 08/05/2023 Creatinine 1.88, BUN 18, Potassium 4.4, Sodium 141, GFR 29 07/05/2023 Creatinine 1.87, BUN 17, Potassium 4.3, Sodium 138, GFR 30 04/12/2023 Creatinine 1.54, BUN 17, Potassium 4.0, Sodium 138, GFR 37 09/26/2022 Creatinine 1.57, BUN 18, Potassium 3.9, Sodium 138, GFR 37 A complete set of results can be found in Results Review.   Recommendations:  No changes and encouraged to call if experiencing any fluid symptoms.   Follow-up plan: ICM clinic phone appointment on 11/04/2023.   91 day device clinic remote transmission 12/06/2023.     EP/Cardiology Office Visits:  3-4 month follow up with HF clinic approximately 12/26/2023 (no recall).   Recall 02/27/2024 with Dr Elberta Fortis.  Nephrologist monitoring kidney function.   Copy of ICM  check sent to Dr. Elberta Fortis.      3 month ICM trend: 10/01/2023.    12-14 Month ICM trend:     [name], RN 10/02/2023 11:03 AM

## 2023-10-05 ENCOUNTER — Other Ambulatory Visit (HOSPITAL_COMMUNITY): Payer: Self-pay

## 2023-10-11 NOTE — Progress Notes (Signed)
 Remote ICD transmission.

## 2023-10-29 ENCOUNTER — Other Ambulatory Visit (HOSPITAL_COMMUNITY): Payer: Self-pay

## 2023-10-29 ENCOUNTER — Other Ambulatory Visit: Payer: Self-pay

## 2023-10-29 ENCOUNTER — Encounter (HOSPITAL_COMMUNITY): Payer: Self-pay

## 2023-11-04 ENCOUNTER — Ambulatory Visit: Payer: Medicare PPO | Attending: Cardiology

## 2023-11-04 DIAGNOSIS — I5022 Chronic systolic (congestive) heart failure: Secondary | ICD-10-CM | POA: Diagnosis not present

## 2023-11-04 DIAGNOSIS — Z95810 Presence of automatic (implantable) cardiac defibrillator: Secondary | ICD-10-CM | POA: Diagnosis not present

## 2023-11-06 NOTE — Progress Notes (Signed)
 EPIC Encounter for ICM Monitoring  Patient Name: Michelle Andrade is a 63 y.o. female Date: 11/06/2023 Primary Care Physican: Greta Doom, MD Primary Cardiologist: Bensimhon Electrophysiologist: Camnitz Bi-V Pacing: >99%          02/05/2022 Weight: 172 lbs 05/23/2022 Weight: 172 lbs 09/12/2022 Weight: 175 lbs 12/26/2022 Weight: 179 lbs 02/27/2023 Office Weight:  181 lbs 05/17/2023 Weight: 171 lbs 07/23/2023 Weight: 156 lbs 10/02/2023 Weight: 159 lbs                                                          Spoke with patient and heart failure questions reviewed.  Transmission results reviewed.  Pt asymptomatic for fluid accumulation.  Reports feeling well at this time and voices no complaints.       Diet:  She does not limit salt or fluid intake.     CorVue thoracic impedance suggesting normal fluid levels with the exception of possible fluid accumulation 2/13-2/18.   Prescribed:  Torsemide 20 mg take 2 tablets (40 mg total) by mouth twice a day. Potassium 20 mEq take 2 tablet (40 mEq total) by mouth twice a day.   Labs: 08/26/2023 Creatinine 1.98, BUN 25, Potassium 4.4, Sodium 139, GFR 28 08/19/2023 Creatinine 2.11, BUN 29, Potassium 5.8, Sodium 136, GFR 26 08/05/2023 Creatinine 1.88, BUN 18, Potassium 4.4, Sodium 141, GFR 29 07/05/2023 Creatinine 1.87, BUN 17, Potassium 4.3, Sodium 138, GFR 30 04/12/2023 Creatinine 1.54, BUN 17, Potassium 4.0, Sodium 138, GFR 37 09/26/2022 Creatinine 1.57, BUN 18, Potassium 3.9, Sodium 138, GFR 37 A complete set of results can be found in Results Review.   Recommendations:  No changes and encouraged to call if experiencing any fluid symptoms.   Follow-up plan: ICM clinic phone appointment on 12/10/2023.   91 day device clinic remote transmission 12/09/2023.     EP/Cardiology Office Visits:  12/16/2023 with HF clinic.   Recall 02/27/2024 with Dr Elberta Fortis.  Nephrologist monitoring kidney function.   Copy of ICM check sent to Dr. Elberta Fortis.     3 month ICM  trend: 11/04/2023.    12-14 Month ICM trend:     [name], RN 11/06/2023 3:50 PM

## 2023-11-25 ENCOUNTER — Other Ambulatory Visit (HOSPITAL_COMMUNITY): Payer: Self-pay

## 2023-12-06 ENCOUNTER — Telehealth (HOSPITAL_COMMUNITY): Payer: Self-pay | Admitting: Cardiology

## 2023-12-06 LAB — CUP PACEART REMOTE DEVICE CHECK
Battery Remaining Longevity: 52 mo
Battery Remaining Percentage: 57 %
Battery Voltage: 2.95 V
Brady Statistic AP VP Percent: 5.6 %
Brady Statistic AP VS Percent: 1 %
Brady Statistic AS VP Percent: 94 %
Brady Statistic AS VS Percent: 1 %
Brady Statistic RA Percent Paced: 5.4 %
Date Time Interrogation Session: 20250404022413
HighPow Impedance: 68 Ohm
Implantable Lead Connection Status: 753985
Implantable Lead Connection Status: 753985
Implantable Lead Connection Status: 753985
Implantable Lead Implant Date: 20150915
Implantable Lead Implant Date: 20150915
Implantable Lead Implant Date: 20150915
Implantable Lead Location: 753858
Implantable Lead Location: 753859
Implantable Lead Location: 753860
Implantable Pulse Generator Implant Date: 20220107
Lead Channel Impedance Value: 380 Ohm
Lead Channel Impedance Value: 430 Ohm
Lead Channel Impedance Value: 430 Ohm
Lead Channel Pacing Threshold Amplitude: 0.625 V
Lead Channel Pacing Threshold Amplitude: 0.75 V
Lead Channel Pacing Threshold Amplitude: 1.625 V
Lead Channel Pacing Threshold Pulse Width: 0.4 ms
Lead Channel Pacing Threshold Pulse Width: 0.4 ms
Lead Channel Pacing Threshold Pulse Width: 0.4 ms
Lead Channel Sensing Intrinsic Amplitude: 12 mV
Lead Channel Sensing Intrinsic Amplitude: 3.4 mV
Lead Channel Setting Pacing Amplitude: 1.625
Lead Channel Setting Pacing Amplitude: 2.25 V
Lead Channel Setting Pacing Amplitude: 2.625
Lead Channel Setting Pacing Pulse Width: 0.4 ms
Lead Channel Setting Pacing Pulse Width: 0.4 ms
Lead Channel Setting Sensing Sensitivity: 0.5 mV
Pulse Gen Serial Number: 111036830

## 2023-12-06 NOTE — Telephone Encounter (Signed)
 Patient seen by [facility] And prescribed tramadol for shoulder pain.  Wanted to confirm its ok to start with cardiac history and current meds

## 2023-12-06 NOTE — Telephone Encounter (Signed)
Pt aware.

## 2023-12-06 NOTE — Telephone Encounter (Signed)
 Yes, ok to take.

## 2023-12-09 ENCOUNTER — Encounter

## 2023-12-09 ENCOUNTER — Ambulatory Visit (INDEPENDENT_AMBULATORY_CARE_PROVIDER_SITE_OTHER): Payer: Medicare Other

## 2023-12-09 DIAGNOSIS — I429 Cardiomyopathy, unspecified: Secondary | ICD-10-CM

## 2023-12-09 DIAGNOSIS — I5022 Chronic systolic (congestive) heart failure: Secondary | ICD-10-CM | POA: Diagnosis not present

## 2023-12-10 ENCOUNTER — Ambulatory Visit: Attending: Cardiology

## 2023-12-10 DIAGNOSIS — Z95810 Presence of automatic (implantable) cardiac defibrillator: Secondary | ICD-10-CM | POA: Diagnosis not present

## 2023-12-10 DIAGNOSIS — I5022 Chronic systolic (congestive) heart failure: Secondary | ICD-10-CM | POA: Diagnosis not present

## 2023-12-11 NOTE — Progress Notes (Signed)
 Advanced Heart Failure Clinic Note  PCP: Muriel Arm, MD HF Cardiologist: Dr. Julane Ny  Nephrology: Dr Rogene Claude ([facility])   CC: F/u for Chronic Systolic Heart Failure   HPI:  [name] is a 63 y.o. female with a past medical history of HTN, chronic systolic HF due to NICM (normal cors on cath 2014, nonobs CAD on cath 5/21) s/p CRT-D ([employer]), atrial tachycardia (2017), CKD stage III, DM, OSA w/ CPAP.   Admitted 4/18 with ADHF at [facility] Echo that admission with EF 25-30%.   Has angioedema with ACE-I and cough with ARB.    Echo 3/20 LVEF 25-30%   Admitted 5/21 LHC showed moderate, nonobstructive CAD w/ 50-60% RCA, flow wire 1.0 (non-flow limiting). Medical therapy recommended.  Echo 12/21 EF 25-30%   Echo 6/23 EF 35-40% (outside echo - images unavailable)  ICD changed in 1/22.  Echo 06/27/23 EF 35-40%   Low dose spiro started 10/24 but SCr 1.5>>2.1 and K 5.8. Spiro stopped.   She presents today for routine f/u for systolic heart failure. Doing well from HF standpoint. Stable NYHA Class I-II. Still losing weight on Trulicity. Device interrogation show stable thoracic impedence but she has had several occasions in the last 3 months where she has had some fluid accumulation. No VT/VF. 99% BiV paced. BP soft 96/62. Reports positional dizziness. Says she is not eating as much w/ Trulicity. She denies CP. She is scheduled to undergo rt total shoulder replacement but this is not until mid July, 3 months away.     Review of systems complete and found to be negative unless listed in HPI.    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   CAD (coronary artery disease) 09/01/2013   CHF (congestive heart failure) (HCC) 06/07/2015   CKD (chronic kidney disease), stage III (HCC) 12/09/2013   Depression 07/27/2013   Diabetes mellitus without complication (HCC)  09/27/2014   GERD (gastroesophageal reflux disease) 07/27/2013   Gout 12/29/2013   Heart valve disorder 06/03/2013   High cholesterol 11/10/2013   Hypertension 11/10/2013   IBS (irritable bowel syndrome) 09/27/2014   Myocardial infarct [facility])    Sleep apnea 10/13/2013   Vascular disorder 01/12/2014   Current Outpatient Medications  Medication Sig Dispense Refill   allopurinol (ZYLOPRIM) 100 MG tablet Take 200 mg by mouth daily.      amiodarone (PACERONE) 200 MG tablet Take 0.5 tablets (100 mg total) by mouth daily. 90 tablet 3   aspirin 81 MG chewable tablet Chew 81 mg by mouth daily.     atorvastatin (LIPITOR) 80 MG tablet Take 1 tablet (80 mg total) by mouth at bedtime. 90 tablet 3   Calcium Carbonate-Vitamin D 600-400 MG-UNIT tablet Take 1 tablet by mouth 2 (two) times daily.     carvedilol (COREG) 25 MG tablet Take 1 tablet (25 mg total) by mouth 2 (two) times daily with a meal. 180 tablet 3   colchicine 0.6 MG tablet Take 0.6 mg by mouth 2 (two) times daily as needed (Gout).     cyclobenzaprine (FLEXERIL) 5 MG tablet Take 5 mg by mouth 3 (three) times daily as needed for muscle spasms.     diclofenac Sodium (VOLTAREN ARTHRITIS PAIN) 1 % GEL Apply 1 g topically as needed.     Dulaglutide (TRULICITY) 1.5 MG/0.5ML SOAJ Inject 1.5 mg into the skin every 7 (seven) days. 2 mL 5   Dulaglutide (TRULICITY) 3  MG/0.5ML SOAJ Inject 3 mg under the skin once a week. (Patient taking differently: Take 1.5 mg by mouth once a week. Sundays) 6 mL 1   empagliflozin (JARDIANCE) 10 MG TABS tablet Take 1 tablet (10 mg total) by mouth daily before breakfast. 90 tablet 3   ferrous sulfate 325 (65 FE) MG tablet Take 1 tablet (325 mg total) by mouth 3 (three) times daily. 30 tablet 0   isosorbide mononitrate (IMDUR) 30 MG 24 hr tablet Take 1 tablet (30 mg total) by mouth daily. 90 tablet 3   Magnesium Oxide 420 MG TABS Take 840 mg by mouth daily.     Multiple Vitamin (MULTIVITAMIN) tablet Take 1 tablet by mouth  daily.     omeprazole (PRILOSEC) 40 MG capsule Take 40 mg by mouth daily.     potassium chloride SA (KLOR-CON M) 20 MEQ tablet TAKE 2 TABLETS BY MOUTH TWICE DAILY 120 tablet 0   rOPINIRole (REQUIP) 0.5 MG tablet Take 0.5 mg by mouth at bedtime.      sertraline (ZOLOFT) 100 MG tablet Patient takes 2 tablet at bedtime.     torsemide (DEMADEX) 20 MG tablet Take 2 tablets (40 mg total) by mouth 2 (two) times daily. 112 tablet 5   traMADol (ULTRAM) 50 MG tablet Take 50 mg by mouth.     hydrALAZINE (APRESOLINE) 25 MG tablet Take 0.5 tablets (12.5 mg total) by mouth 3 (three) times daily. 135 tablet 3   No current facility-administered medications for this encounter.   Allergies  Allergen Reactions   Codeine Itching   Gabapentin Itching   Haldol [Haloperidol Lactate] Other (See Comments)    Dizziness, hallucinations   Levofloxacin     Other reaction(s): Other (See Comments) Projectile vomiting    Lisinopril Itching and Swelling   Losartan Itching   Morphine And Codeine Nausea And Vomiting   Penicillins Nausea And Vomiting    "Vomiting, upset stomach, Headache" Has patient had a PCN reaction causing immediate rash, facial/tongue/throat swelling, SOB or lightheadedness with hypotension: No Has patient had a PCN reaction causing severe rash involving mucus membranes or skin necrosis: No Has patient had a PCN reaction that required hospitalization: No Has patient had a PCN reaction occurring within the last 10 years: No If all of the above answers are "NO", then may proceed with Cephalosporin use.    Haloperidol Other (See Comments)   Other Other (See Comments)   Oxycodone Other (See Comments)   Social History   Socioeconomic History   Marital status: Single    Spouse name: Not on file   Number of children: Not on file   Years of education: Not on file   Highest education level: Not on file  Occupational History   Not on file  Tobacco Use   Smoking status: Never   Smokeless  tobacco: Never  Vaping Use   Vaping status: Never Used  Substance and Sexual Activity   Alcohol use: No   Drug use: No   Sexual activity: Yes  Other Topics Concern   Not on file  Social History Narrative   Not on file   Social Drivers of Health   Financial Resource Strain: Not on file  Food Insecurity: Not on file  Transportation Needs: Not on file  Physical Activity: Not on file  Stress: Not on file  Social Connections: Not on file  Intimate Partner Violence: Not on file   Family History  Problem Relation Age of Onset   Hypertension Mother  BP 96/62   Pulse 69   Ht 5\' 3"  (1.6 m)   Wt 72.1 kg (159 lb)   SpO2 96%   BMI 28.17 kg/m   Wt Readings from Last 3 Encounters:  12/16/23 72.1 kg (159 lb)  09/27/23 72.2 kg (159 lb 3.2 oz)  06/27/23 74.2 kg (163 lb 9.6 oz)    PHYSICAL EXAM: General:  Well appearing. No respiratory difficulty HEENT: normal Neck: supple. no JVD. Carotids 2+ bilat; no bruits. No lymphadenopathy or thyromegaly appreciated. Cor: PMI nondisplaced. Regular rate & rhythm. No rubs, gallops or murmurs. Lungs: clear Abdomen: soft, nontender, nondistended. No hepatosplenomegaly. No bruits or masses. Good bowel sounds. Extremities: no cyanosis, clubbing, rash, edema Neuro: alert & oriented x 3, cranial nerves grossly intact. moves all 4 extremities w/o difficulty. Affect pleasant.   ICD interrogation (personally reviewed): stable thoracic impedence currently but several occurances of fluid accumulation over the last 3 months, now resolved. No VT/VF. 99% BiV paced   ECG (personally reviewed): not performed    ASSESSMENT & PLAN: 1. Chronic Systolic Heart Faliure:  - NICM, normal cors in 2014, likely related to HTN vs. Noncompliance vs. Tachy- medicated with history of atrial tach. - s/p STJ CRT-D - Echo 6/18 EF 20%, moderate MR, moderate TR. PA pressure 80 mm Hg.    - Echo 3/20 EF 25-30% - Cath 5/21: Nonobstructive CAD RCA 60% lesion - Echo 12/21 EF  25-30% RV ok  - Echo 6/23 EF 35-40% (outside echo - images unavailable) - Echo 10/24 EF 35-40%  - NYHA Class I-II, stable. Euvolemic on exam and by device interrogation.  - No ARB/ ANRi due to angioedema/CKD. Had itching with losartan - Off spiro with hyperkalemia and AKI - Reduce hydralazine to 12.5 mg tid given low BP, positional dizziness and CKD - Continue torsemide 40 mg bid (I think she needs to continue BID dosing given recent impedence trends) - Continue Imdur 30 mg daily (antianginal benefit). - Continue carvedilol 25 mg bid.  - Continue Jardiance 10 mg daily. Denies GU symptoms.  - Reviewed BMP from last wk. SCr stable. K 4.1   2. CAD, non-obstructive - LHC 01/2020 50-60% RCA, negative FFR (non-flow limiting). - stable w/o CP  - Continue  ASA + statin + Imdur + ? blocker.  3. HLD - Continue atorva 80. - Goal LDL < 70 - Followed by PCP.  4. CKD stage IIIa  - Baseline creatinine 1.5-1.8 - Follows with Dr. Rogene Claude - reviewed recent BMP, SCr stable at 1.7   5. HTN - BP soft, 90s systolic. Reports positional dizziness - reduce hydralazine to 12.5 mg tid  6. History of atrial tachycardia - Followed by Dr. Lawana Pray  - Continue amiodarone 100 mg daily. Check TSH/HF next visit  - Device interrogation today shows no AT/AF  7. Rt Shoulder OA - needs total shoulder replacement, this is tentatively scheduled for 7/18 (3 months away). Would recommend pt be re-examined closer to surgical date (1 month prior) to reassess cardiac readiness for clearance and ensure no interval change in symptoms/functional status that may warrant further pre-op evaluation/optimization   F/u w/ APP in 2 months    Arhaan Chesnut Saralee Cummins, PA-C 12/16/23

## 2023-12-11 NOTE — Progress Notes (Signed)
 EPIC Encounter for ICM Monitoring  Patient Name: Michelle Andrade is a 63 y.o. female Date: 12/11/2023 Primary Care Physican: Greta Doom, MD Primary Cardiologist: Bensimhon Electrophysiologist: Camnitz Bi-V Pacing: >99%          05/17/2023 Weight: 171 lbs 07/23/2023 Weight: 156 lbs 10/02/2023 Weight: 159 lbs 12/11/2023 Weight: 157 lbs                                                          Spoke with patient and heart failure questions reviewed.  Transmission results reviewed.  Pt asymptomatic for fluid accumulation.   She will have her shoulder checked (hx of shoulder replacement).   Diet:  She does not limit salt or fluid intake.     CorVue thoracic impedance suggesting normal fluid levels with the exception of possible fluid accumulation 3/17-3/25.   Prescribed:  Torsemide 20 mg take 2 tablets (40 mg total) by mouth twice a day. Potassium 20 mEq take 2 tablet (40 mEq total) by mouth twice a day.   Labs: 12/05/2023 Creatinine 1.69, BUN 15, Potassium 4.1, Sodium 140, GFR 34 12/02/2023 Creatinine 1.67, BUN 18, Potassium 4.0, Sodium 139, GFR 34 08/26/2023 Creatinine 1.98, BUN 25, Potassium 4.4, Sodium 139, GFR 28 08/19/2023 Creatinine 2.11, BUN 29, Potassium 5.8, Sodium 136, GFR 26 08/05/2023 Creatinine 1.88, BUN 18, Potassium 4.4, Sodium 141, GFR 29 07/05/2023 Creatinine 1.87, BUN 17, Potassium 4.3, Sodium 138, GFR 30 04/12/2023 Creatinine 1.54, BUN 17, Potassium 4.0, Sodium 138, GFR 37 09/26/2022 Creatinine 1.57, BUN 18, Potassium 3.9, Sodium 138, GFR 37 A complete set of results can be found in Results Review.   Recommendations:  No changes and encouraged to call if experiencing any fluid symptoms.   Follow-up plan: ICM clinic phone appointment on 01/13/2024.   91 day device clinic remote transmission 03/09/2024.     EP/Cardiology Office Visits:  12/16/2023 with HF clinic.   Recall 02/27/2024 with Dr Elberta Fortis.  Nephrologist monitoring kidney function.   Copy of ICM check sent to Dr.  Elberta Fortis.     3 month ICM trend: 12/10/2023.    12-14 Month ICM trend:     [name], RN 12/11/2023 4:09 PM

## 2023-12-13 ENCOUNTER — Telehealth (HOSPITAL_COMMUNITY): Payer: Self-pay

## 2023-12-13 NOTE — Telephone Encounter (Signed)
 Called to confirm/remind patient of their appointment at the Advanced [facility] Clinic on 12/16/23.   Appointment:   [] Confirmed  [] Left mess   [x] No answer/No voice mail  [] Phone not in service

## 2023-12-16 ENCOUNTER — Ambulatory Visit (HOSPITAL_COMMUNITY)
Admission: RE | Admit: 2023-12-16 | Discharge: 2023-12-16 | Disposition: A | Discharge status: Home / Self Care | Payer: Medicare PPO | Source: Ambulatory Visit | Attending: Cardiology | Admitting: Cardiology

## 2023-12-16 ENCOUNTER — Encounter (HOSPITAL_COMMUNITY): Payer: Self-pay

## 2023-12-16 VITALS — BP 96/62 | HR 69 | Ht 63.0 in | Wt 159.0 lb

## 2023-12-16 DIAGNOSIS — I5022 Chronic systolic (congestive) heart failure: Secondary | ICD-10-CM | POA: Insufficient documentation

## 2023-12-16 DIAGNOSIS — Z7984 Long term (current) use of oral hypoglycemic drugs: Secondary | ICD-10-CM | POA: Diagnosis not present

## 2023-12-16 DIAGNOSIS — E785 Hyperlipidemia, unspecified: Secondary | ICD-10-CM | POA: Diagnosis not present

## 2023-12-16 DIAGNOSIS — N1831 Chronic kidney disease, stage 3a: Secondary | ICD-10-CM | POA: Diagnosis not present

## 2023-12-16 DIAGNOSIS — E1122 Type 2 diabetes mellitus with diabetic chronic kidney disease: Secondary | ICD-10-CM | POA: Diagnosis not present

## 2023-12-16 DIAGNOSIS — I428 Other cardiomyopathies: Secondary | ICD-10-CM | POA: Diagnosis not present

## 2023-12-16 DIAGNOSIS — I130 Hypertensive heart and chronic kidney disease with heart failure and stage 1 through stage 4 chronic kidney disease, or unspecified chronic kidney disease: Secondary | ICD-10-CM | POA: Diagnosis not present

## 2023-12-16 DIAGNOSIS — G4733 Obstructive sleep apnea (adult) (pediatric): Secondary | ICD-10-CM | POA: Insufficient documentation

## 2023-12-16 DIAGNOSIS — M19011 Primary osteoarthritis, right shoulder: Secondary | ICD-10-CM | POA: Insufficient documentation

## 2023-12-16 DIAGNOSIS — I2510 Atherosclerotic heart disease of native coronary artery without angina pectoris: Secondary | ICD-10-CM | POA: Insufficient documentation

## 2023-12-16 DIAGNOSIS — Z95810 Presence of automatic (implantable) cardiac defibrillator: Secondary | ICD-10-CM | POA: Diagnosis not present

## 2023-12-16 MED ORDER — HYDRALAZINE HCL 25 MG PO TABS: 12.5000 mg | ORAL_TABLET | Freq: Three times a day (TID) | ORAL | 3 refills | Status: AC

## 2023-12-16 NOTE — Patient Instructions (Addendum)
 Thank you for coming in today  If you had labs drawn today, any labs that are abnormal the clinic will call you No news is good news  Medications: Decrease Hydralazine 12.5 mg 1/2 tablet 3 times daily  Follow up appointments:  Your physician recommends that you schedule a follow-up appointment in:  2 months in clinic for Cardiac Clearance    Do the following things EVERYDAY: Weigh yourself in the morning before breakfast. Write it down and keep it in a log. Take your medicines as prescribed Eat low salt foods--Limit salt (sodium) to 2000 mg per day.  Stay as active as you can everyday Limit all fluids for the day to less than 2 liters   At the [facility], you and your health needs are our priority. As part of our continuing mission to provide you with exceptional heart care, we have created designated Provider Care Teams. These Care Teams include your primary Cardiologist (physician) and Advanced Practice Providers (APPs- Physician Assistants and Nurse Practitioners) who all work together to provide you with the care you need, when you need it.   You may see any of the following providers on your designated Care Team at your next follow up: Dr Jules Oar Dr Peder Bourdon Dr. Mimi Alt, NP [facility]ral, [place] [facility], [place] Dennise Fitz, NP Luster Salters, PharmD   Please be sure to bring in all your medications bottles to every appointment.    Thank you for choosing [facility]  If you have any questions or concerns before your next appointment please send us  a message through [place] or call our office at [phone].    TO LEAVE A MESSAGE FOR THE NURSE SELECT OPTION 2, PLEASE LEAVE A MESSAGE INCLUDING: YOUR NAME DATE OF BIRTH CALL BACK NUMBER REASON FOR CALL**this is important as we prioritize the call backs  YOU WILL RECEIVE A CALL BACK THE SAME DAY AS LONG AS YOU  CALL BEFORE 4:00 PM

## 2024-01-01 ENCOUNTER — Encounter: Payer: Self-pay | Admitting: Internal Medicine

## 2024-01-13 ENCOUNTER — Ambulatory Visit: Attending: Cardiology

## 2024-01-13 DIAGNOSIS — Z95810 Presence of automatic (implantable) cardiac defibrillator: Secondary | ICD-10-CM | POA: Diagnosis not present

## 2024-01-13 DIAGNOSIS — I5022 Chronic systolic (congestive) heart failure: Secondary | ICD-10-CM | POA: Diagnosis not present

## 2024-01-15 ENCOUNTER — Telehealth: Payer: Self-pay

## 2024-01-15 NOTE — Telephone Encounter (Signed)
 Remote ICM transmission received.  Attempted call to patient regarding ICM remote transmission and left detailed message per DPR.  Left ICM phone number and advised to return call for any fluid symptoms or questions. Next ICM remote transmission scheduled 02/17/2024.

## 2024-01-15 NOTE — Progress Notes (Signed)
 EPIC Encounter for ICM Monitoring  Patient Name: Michelle Andrade is a 63 y.o. female Date: 01/15/2024 Primary Care Physican: Muriel Arm, MD Primary Cardiologist: Bensimhon Electrophysiologist: Camnitz Bi-V Pacing: >99%          05/17/2023 Weight: 171 lbs 07/23/2023 Weight: 156 lbs 10/02/2023 Weight: 159 lbs 12/11/2023 Weight: 157 lbs                                                          Attempted call to patient and unable to reach.  Left detailed message per DPR regarding transmission.  Transmission results reviewed.   Scheduled for Revision of right total shoulder replacement on 7/18.   Diet:  She does not limit salt or fluid intake.     CorVue thoracic impedance suggesting normal fluid levels with the exception of possible fluid accumulation 4/26-5/4.   Prescribed:  Torsemide  20 mg take 2 tablets (40 mg total) by mouth twice a day. Potassium 20 mEq take 2 tablet (40 mEq total) by mouth twice a day.   Labs: 12/05/2023 Creatinine 1.69, BUN 15, Potassium 4.1, Sodium 140, GFR 34 12/02/2023 Creatinine 1.67, BUN 18, Potassium 4.0, Sodium 139, GFR 34 08/26/2023 Creatinine 1.98, BUN 25, Potassium 4.4, Sodium 139, GFR 28 08/19/2023 Creatinine 2.11, BUN 29, Potassium 5.8, Sodium 136, GFR 26 08/05/2023 Creatinine 1.88, BUN 18, Potassium 4.4, Sodium 141, GFR 29 07/05/2023 Creatinine 1.87, BUN 17, Potassium 4.3, Sodium 138, GFR 30 04/12/2023 Creatinine 1.54, BUN 17, Potassium 4.0, Sodium 138, GFR 37 09/26/2022 Creatinine 1.57, BUN 18, Potassium 3.9, Sodium 138, GFR 37 A complete set of results can be found in Results Review.   Recommendations:  Left voice mail with ICM number and encouraged to call if experiencing any fluid symptoms.   Follow-up plan: ICM clinic phone appointment on 02/17/2024.   91 day device clinic remote transmission 03/09/2024.     EP/Cardiology Office Visits:  02/14/2024 with HF clinic.   Recall 02/27/2024 with Dr Lawana Pray.  Nephrologist monitoring kidney function.    Copy of ICM check sent to Dr. Lawana Pray.      3 month ICM trend: 01/13/2024.    12-14 Month ICM trend:     Almyra Jain, RN 01/15/2024 8:51 AM

## 2024-01-20 NOTE — Progress Notes (Signed)
 Remote ICD transmission.

## 2024-01-22 ENCOUNTER — Other Ambulatory Visit (HOSPITAL_COMMUNITY): Payer: Self-pay

## 2024-01-22 MED ORDER — TRULICITY 1.5 MG/0.5ML ~~LOC~~ SOAJ
1.5000 mg | SUBCUTANEOUS | 5 refills | Status: DC
Start: 2024-01-22 — End: 2024-07-13
  Filled 2024-01-22 (×2): qty 2, 28d supply, fill #0
  Filled 2024-02-10 – 2024-02-11 (×2): qty 2, 28d supply, fill #1
  Filled 2024-03-08: qty 2, 28d supply, fill #2
  Filled 2024-04-07: qty 2, 28d supply, fill #3
  Filled 2024-05-11: qty 2, 28d supply, fill #4
  Filled 2024-06-12: qty 2, 28d supply, fill #5

## 2024-02-10 ENCOUNTER — Other Ambulatory Visit (HOSPITAL_COMMUNITY): Payer: Self-pay

## 2024-02-12 NOTE — Progress Notes (Signed)
 Advanced Heart Failure Clinic Note  PCP: Muriel Arm, MD Nephrology: Dr Rogene Claude ([facility])  HF Cardiologist: Dr. Julane Ny   HPI:  Michelle Andrade is a 63 y.o. female with a past medical history of HTN, chronic systolic HF due to NICM (normal cors on cath 2014, nonobs CAD on cath 5/21) s/p CRT-D ([employer]), atrial tachycardia (2017), CKD stage III, DM, OSA w/ CPAP.   Admitted 4/18 with ADHF at [facility]. Echo that admission with EF 25-30%.   Has angioedema with ACE-I and cough with ARB.    Echo 3/20 LVEF 25-30%   Admitted 5/21 LHC showed moderate, nonobstructive CAD w/ 50-60% RCA, flow wire 1.0 (non-flow limiting). Medical therapy recommended.  Echo 12/21 EF 25-30%   Echo 6/23 EF 35-40% (outside echo - images unavailable)  ICD changed in 1/22.  Echo 06/27/23 EF 35-40%   Low dose spiro started 10/24 but SCr 1.5>>2.1 and K 5.8 => spiro stopped.   Today she returns for HF follow up. Overall feeling fine. Has positional dizziness, no falls or syncope. She is unsure if symptoms related to her diabetes. She has SOB going up steps. Denies palpitations, abnormal bleeding, CP, edema, or PND/Orthopnea. Chronically sleeps reclined.  Appetite ok.Weight at home 154 pounds. Taking all medications. Planning shoulder surgery soon. Back wearing CPAP.  Review of systems complete and found to be negative unless listed in HPI.    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   CAD (coronary artery disease) 09/01/2013   CHF (congestive heart failure) (HCC) 06/07/2015   CKD (chronic kidney disease), stage III (HCC) 12/09/2013   Depression 07/27/2013   Diabetes mellitus without complication (HCC) 09/27/2014   GERD (gastroesophageal reflux disease) 07/27/2013   Gout 12/29/2013   Heart valve disorder 06/03/2013   High cholesterol 11/10/2013   Hypertension 11/10/2013   IBS (irritable  bowel syndrome) 09/27/2014   Myocardial infarct [facility])    Sleep apnea 10/13/2013   Vascular disorder 01/12/2014   Current Outpatient Medications  Medication Sig Dispense Refill   allopurinol  (ZYLOPRIM ) 100 MG tablet Take 200 mg by mouth daily.      amiodarone  (PACERONE ) 200 MG tablet Take 0.5 tablets (100 mg total) by mouth daily. 90 tablet 3   aspirin  81 MG chewable tablet Chew 81 mg by mouth daily.     atorvastatin  (LIPITOR) 80 MG tablet Take 1 tablet (80 mg total) by mouth at bedtime. 90 tablet 3   Calcium  Carbonate-Vitamin D 600-400 MG-UNIT tablet Take 1 tablet by mouth 2 (two) times daily.     carvedilol  (COREG ) 25 MG tablet Take 1 tablet (25 mg total) by mouth 2 (two) times daily with a meal. 180 tablet 3   colchicine  0.6 MG tablet Take 0.6 mg by mouth 2 (two) times daily as needed (Gout).     diclofenac  Sodium (VOLTAREN  ARTHRITIS PAIN) 1 % GEL Apply 1 g topically as needed.     Dulaglutide  (TRULICITY ) 1.5 MG/0.5ML SOAJ Inject 1.5 mg into the skin once a week. 2 mL 5   empagliflozin  (JARDIANCE ) 10 MG TABS tablet Take 1 tablet (10 mg total) by mouth daily before breakfast. 90 tablet 3   ferrous sulfate  325 (65 FE) MG tablet Take 1 tablet (325 mg total) by mouth 3 (three) times daily. 30 tablet 0   hydrALAZINE  (APRESOLINE ) 25 MG tablet Take 0.5 tablets (12.5 mg total) by mouth 3 (three) times daily. 135 tablet 3  isosorbide  mononitrate (IMDUR ) 30 MG 24 hr tablet Take 1 tablet (30 mg total) by mouth daily. 90 tablet 3   Magnesium Oxide 420 MG TABS Take 840 mg by mouth daily.     Multiple Vitamin (MULTIVITAMIN) tablet Take 1 tablet by mouth daily.     omeprazole (PRILOSEC) 40 MG capsule Take 40 mg by mouth daily.     potassium chloride  SA (KLOR-CON  M) 20 MEQ tablet TAKE 2 TABLETS BY MOUTH TWICE DAILY 120 tablet 0   rOPINIRole  (REQUIP ) 0.5 MG tablet Take 0.5 mg by mouth at bedtime.      sertraline  (ZOLOFT ) 100 MG tablet Patient takes 2 tablet at bedtime. (Patient taking differently: Take  200 mg by mouth at bedtime. Patient takes 2 tablet at bedtime.)     torsemide  (DEMADEX ) 20 MG tablet Take 2 tablets (40 mg total) by mouth 2 (two) times daily. 112 tablet 5   traMADol (ULTRAM) 50 MG tablet Take 50 mg by mouth. (Patient taking differently: Take 50 mg by mouth every 6 (six) hours as needed.)     No current facility-administered medications for this encounter.   Allergies  Allergen Reactions   Codeine Itching   Gabapentin Itching   Haldol [Haloperidol Lactate] Other (See Comments)    Dizziness, hallucinations   Levofloxacin     Other reaction(s): Other (See Comments) Projectile vomiting    Lisinopril Itching and Swelling   Losartan Itching   Morphine And Codeine Nausea And Vomiting   Penicillins Nausea And Vomiting    Vomiting, upset stomach, Headache Has patient had a PCN reaction causing immediate rash, facial/tongue/throat swelling, SOB or lightheadedness with hypotension: No Has patient had a PCN reaction causing severe rash involving mucus membranes or skin necrosis: No Has patient had a PCN reaction that required hospitalization: No Has patient had a PCN reaction occurring within the last 10 years: No If all of the above answers are NO, then may proceed with Cephalosporin use.    Haloperidol Other (See Comments)   Other Other (See Comments)   Oxycodone Other (See Comments)   Social History   Socioeconomic History   Marital status: Single    Spouse name: Not on file   Number of children: Not on file   Years of education: Not on file   Highest education level: Not on file  Occupational History   Not on file  Tobacco Use   Smoking status: Never   Smokeless tobacco: Never  Vaping Use   Vaping status: Never Used  Substance and Sexual Activity   Alcohol use: No   Drug use: No   Sexual activity: Yes  Other Topics Concern   Not on file  Social History Narrative   Not on file   Social Drivers of Health   Financial Resource Strain: Not on file   Food Insecurity: Not on file  Transportation Needs: Not on file  Physical Activity: Not on file  Stress: Not on file  Social Connections: Not on file  Intimate Partner Violence: Not on file   Family History  Problem Relation Age of Onset   Hypertension Mother    BP (!) 148/76   Pulse 63   Wt 73.3 kg (161 lb 9.6 oz)   SpO2 98%   BMI 28.63 kg/m   Wt Readings from Last 3 Encounters:  02/14/24 73.3 kg (161 lb 9.6 oz)  12/16/23 72.1 kg (159 lb)  09/27/23 72.2 kg (159 lb 3.2 oz)    Orthostatics today 02/14/24 Lying: 140/88, 61 Sitting:  150/80, 62 Standing: 140/80, 64  PHYSICAL EXAM: General:  NAD. No resp difficulty, walked into clinic HEENT: Normal Neck: Supple. No JVD. Cor: Regular rate & rhythm. No rubs, gallops or murmurs. Lungs: Clear Abdomen: Soft, nontender, nondistended.  Extremities: No cyanosis, clubbing, rash, edema Neuro: Alert & oriented x 3, moves all 4 extremities w/o difficulty. Affect pleasant.  ICD interrogation (personally reviewed): CorVue stable, >99% BV pacing, no VT, no AT/AF  ECG (personally reviewed): A sensed V paced, 60 bpm  ASSESSMENT & PLAN: 1. Chronic Systolic Heart Faliure:  - NICM, normal cors in 2014, likely related to HTN vs. Noncompliance vs. Tachy- medicated with history of atrial tach. - s/p STJ CRT-D - Echo 6/18 EF 20%, moderate MR, moderate TR. PA pressure 80 mm Hg.    - Echo 3/20 EF 25-30% - Cath 5/21: Nonobstructive CAD RCA 60% lesion - Echo 12/21 EF 25-30% RV ok  - Echo 6/23 EF 35-40% (outside echo - images unavailable) - Echo 10/24 EF 35-40%  - NYHA II, stable. Volume looks good today. Has orthostatic symptoms, orthostatic VS look OK today. - Given Rx for compression hose - Continue torsemide  40 mg bid + 40 KCL bid - Continue hydralazine  12.5 mg tid + Imdur  30 mg daily. - Continue carvedilol  25 mg bid.  - Continue Jardiance  10 mg daily. Denies GU symptoms.  - No ARB/ ANRi due to angioedema/CKD. Had itching with  losartan - Off spiro with hyperkalemia and AKI - Labs today. - Update echo at the end of the year.  2. CAD, non-obstructive - LHC 01/2020 50-60% RCA, negative FFR (non-flow limiting). - No chest pain - Continue  ASA + statin + Imdur  + ? blocker.  3. HLD - Continue atorva 80 mg daily. - Goal LDL < 70 - Followed by PCP.  4. CKD stage IIIa  - Baseline SCr 1.5-1.8 - Continue SGLT2i - Follows with Dr. Rogene Claude - BMET today.  5. HTN - BP up a bit in clinic. - Orthostatics not overly impressive today, but she feels undsteady - Continue current meds. Check bp daily and log. Notify clinic if sBP> 140, would then go back up on hydralazine . - Labs today.  6. History of atrial tachycardia - Followed by Dr. Lawana Pray  - Continue amiodarone  100 mg daily. Will need regular eye exam - Device interrogation today shows no AT/AF - LFTs and TSH today.  7. Rt Shoulder OA - needs total shoulder replacement - RCRI score = 3 pts (15% risk of major cardiac event) - She is at an acceptable CV risk, she has a good functional status. Discussed with Dr. Bensimhon.   Follow up in 2 months with APP for post-should replacement follow up and 4 months with Dr. Julane Ny + echo.  Michelle Bene Wilson, [name] 02/14/24

## 2024-02-14 ENCOUNTER — Ambulatory Visit (HOSPITAL_COMMUNITY): Payer: Self-pay | Admitting: Family Medicine

## 2024-02-14 ENCOUNTER — Encounter (HOSPITAL_COMMUNITY): Payer: Self-pay | Admitting: Internal Medicine

## 2024-02-14 ENCOUNTER — Ambulatory Visit (HOSPITAL_COMMUNITY)
Admission: RE | Admit: 2024-02-14 | Discharge: 2024-02-14 | Disposition: A | Discharge status: Home / Self Care | Source: Ambulatory Visit | Attending: Family Medicine

## 2024-02-14 ENCOUNTER — Encounter (HOSPITAL_COMMUNITY): Payer: Self-pay

## 2024-02-14 VITALS — BP 140/80 | HR 64 | Wt 161.6 lb

## 2024-02-14 DIAGNOSIS — E785 Hyperlipidemia, unspecified: Secondary | ICD-10-CM | POA: Insufficient documentation

## 2024-02-14 DIAGNOSIS — I5022 Chronic systolic (congestive) heart failure: Secondary | ICD-10-CM | POA: Insufficient documentation

## 2024-02-14 DIAGNOSIS — Z8249 Family history of ischemic heart disease and other diseases of the circulatory system: Secondary | ICD-10-CM | POA: Insufficient documentation

## 2024-02-14 DIAGNOSIS — I130 Hypertensive heart and chronic kidney disease with heart failure and stage 1 through stage 4 chronic kidney disease, or unspecified chronic kidney disease: Secondary | ICD-10-CM | POA: Insufficient documentation

## 2024-02-14 DIAGNOSIS — E1122 Type 2 diabetes mellitus with diabetic chronic kidney disease: Secondary | ICD-10-CM | POA: Insufficient documentation

## 2024-02-14 DIAGNOSIS — N1831 Chronic kidney disease, stage 3a: Secondary | ICD-10-CM | POA: Diagnosis not present

## 2024-02-14 DIAGNOSIS — Z79899 Other long term (current) drug therapy: Secondary | ICD-10-CM | POA: Insufficient documentation

## 2024-02-14 DIAGNOSIS — I2510 Atherosclerotic heart disease of native coronary artery without angina pectoris: Secondary | ICD-10-CM | POA: Insufficient documentation

## 2024-02-14 DIAGNOSIS — I428 Other cardiomyopathies: Secondary | ICD-10-CM | POA: Insufficient documentation

## 2024-02-14 DIAGNOSIS — Z7984 Long term (current) use of oral hypoglycemic drugs: Secondary | ICD-10-CM | POA: Diagnosis not present

## 2024-02-14 DIAGNOSIS — M19011 Primary osteoarthritis, right shoulder: Secondary | ICD-10-CM | POA: Insufficient documentation

## 2024-02-14 DIAGNOSIS — Z7985 Long-term (current) use of injectable non-insulin antidiabetic drugs: Secondary | ICD-10-CM | POA: Diagnosis not present

## 2024-02-14 DIAGNOSIS — I4719 Other supraventricular tachycardia: Secondary | ICD-10-CM | POA: Insufficient documentation

## 2024-02-14 DIAGNOSIS — Z01810 Encounter for preprocedural cardiovascular examination: Secondary | ICD-10-CM

## 2024-02-14 DIAGNOSIS — N183 Chronic kidney disease, stage 3 unspecified: Secondary | ICD-10-CM | POA: Diagnosis not present

## 2024-02-14 DIAGNOSIS — I10 Essential (primary) hypertension: Secondary | ICD-10-CM

## 2024-02-14 NOTE — Patient Instructions (Addendum)
 Good to see you today!  Labs done today, your results will be available in MyChart, we will contact you for abnormal readings.  Your physician has requested that you have an echocardiogram. Echocardiography is a painless test that uses sound waves to create images of your heart. It provides your doctor with information about the size and shape of your heart and how well your heart's chambers and valves are working. This procedure takes approximately one hour. There are no restrictions for this procedure. Please do NOT wear cologne, perfume, aftershave, or lotions (deodorant is allowed). Please arrive 15 minutes prior to your appointment time.  Please note: We ask at that you not bring children with you during ultrasound (echo/ vascular) testing. Due to room size and safety concerns, children are not allowed in the ultrasound rooms during exams. Our front office staff cannot provide observation of children in our lobby area while testing is being conducted. An adult accompanying a patient to their appointment will only be allowed in the ultrasound room at the discretion of the ultrasound technician under special circumstances. We apologize for any inconvenience.  Your physician recommends that you schedule a follow-up appointment  4 months with echocardiogram(October) call office in August to schedule an appointment  We have ordered compression hose Place them on in the am and off in the  pm  If you have any questions or concerns before your next appointment please send us  a message through [place] or call our office at [phone].    TO LEAVE A MESSAGE FOR THE NURSE SELECT OPTION 2, PLEASE LEAVE A MESSAGE INCLUDING: YOUR NAME DATE OF BIRTH CALL BACK NUMBER REASON FOR CALL**this is important as we prioritize the call backs  YOU WILL RECEIVE A CALL BACK THE SAME DAY AS LONG AS YOU CALL BEFORE 4:00 PM At the [facility], you and your health needs are our priority. As part of  our continuing mission to provide you with exceptional heart care, we have created designated Provider Care Teams. These Care Teams include your primary Cardiologist (physician) and Advanced Practice Providers (APPs- Physician Assistants and Nurse Practitioners) who all work together to provide you with the care you need, when you need it.   You may see any of the following providers on your designated Care Team at your next follow up: Dr Jules Oar Dr Peder Bourdon Dr. Alwin Baars Dr. Arta Lark Amy Marijane Shoulders, NP Ruddy Corral, [place] [facility] Spray, [place] Dennise Fitz, NP [place] Lee, NP Shawnee Dellen, NP Luster Salters, PharmD Bevely Brush, PharmD   Please be sure to bring in all your medications bottles to every appointment.    Thank you for choosing [facility]

## 2024-02-14 NOTE — Addendum Note (Signed)
 Encounter addended by: Kaylee Trivett M, RN on: 02/14/2024 2:37 PM  Actions taken: Charge Capture section accepted

## 2024-02-17 ENCOUNTER — Ambulatory Visit: Attending: Cardiology

## 2024-02-17 DIAGNOSIS — Z95810 Presence of automatic (implantable) cardiac defibrillator: Secondary | ICD-10-CM

## 2024-02-17 DIAGNOSIS — I5022 Chronic systolic (congestive) heart failure: Secondary | ICD-10-CM | POA: Diagnosis not present

## 2024-02-18 MED ORDER — POTASSIUM CHLORIDE CRYS ER 20 MEQ PO TBCR: EXTENDED_RELEASE_TABLET | ORAL | Status: AC

## 2024-02-18 MED ORDER — POTASSIUM CHLORIDE CRYS ER 20 MEQ PO TBCR: EXTENDED_RELEASE_TABLET | ORAL | 5 refills | Status: DC

## 2024-02-18 MED ORDER — TORSEMIDE 20 MG PO TABS: 60.0000 mg | ORAL_TABLET | Freq: Two times a day (BID) | ORAL | 5 refills | Status: DC

## 2024-02-18 MED ORDER — TORSEMIDE 20 MG PO TABS: 60.0000 mg | ORAL_TABLET | Freq: Two times a day (BID) | ORAL | Status: AC

## 2024-02-18 NOTE — Progress Notes (Signed)
  Received: Today Horace Lye, PA-C  Neftali Thurow, Myrtie Atkinson, RN Increase Torsemide  to 60 mg bid and KCl to 60 qam/ 40 qpm. Repeat BMP in 1 wk and send remote device transition in 1 wk

## 2024-02-18 NOTE — Progress Notes (Signed)
 EPIC Encounter for ICM Monitoring  Patient Name: Michelle Andrade is a 63 y.o. female Date: 02/18/2024 Primary Care Physican: Muriel Arm, MD Primary Cardiologist: Bensimhon Electrophysiologist: Camnitz Bi-V Pacing: >99%          05/17/2023 Weight: 171 lbs 07/23/2023 Weight: 156 lbs 10/02/2023 Weight: 159 lbs 12/11/2023 Weight: 157 lbs 02/14/2024 Office Weight: 161.9 lbs  02/18/2024 Weight: 163.6 lbs lbs today but after she has eaten and clothes on.  (baseline 157-158 lbs)                                                          Spoke with patient and heart failure questions reviewed.  Transmission results reviewed.  Pt asymptomatic for fluid accumulation.  She reports she is probably drinking too much fluid.     Scheduled for Revision of right total shoulder replacement on 7/18.   Diet:  She does not limit salt or fluid intake.     CorVue thoracic impedance suggesting possible fluid accumulation starting 6/8.   Prescribed:  Torsemide  20 mg take 2 tablets (40 mg total) by mouth twice a day. Potassium 20 mEq take 2 tablet (40 mEq total) by mouth twice a day.   Labs: 12/05/2023 Creatinine 1.69, BUN 15, Potassium 4.1, Sodium 140, GFR 34 12/02/2023 Creatinine 1.67, BUN 18, Potassium 4.0, Sodium 139, GFR 34 08/26/2023 Creatinine 1.98, BUN 25, Potassium 4.4, Sodium 139, GFR 28 08/19/2023 Creatinine 2.11, BUN 29, Potassium 5.8, Sodium 136, GFR 26 08/05/2023 Creatinine 1.88, BUN 18, Potassium 4.4, Sodium 141, GFR 29 07/05/2023 Creatinine 1.87, BUN 17, Potassium 4.3, Sodium 138, GFR 30 04/12/2023 Creatinine 1.54, BUN 17, Potassium 4.0, Sodium 138, GFR 37 09/26/2022 Creatinine 1.57, BUN 18, Potassium 3.9, Sodium 138, GFR 37 A complete set of results can be found in Results Review.   Recommendations:  Copy sent to Cricket Doll, PA at [facility] clinic for review (last seen by Vernia Good, NP on 6/13).   Follow-up plan: ICM clinic phone appointment on 04/06/2024.   91 day device clinic remote  transmission 03/09/2024.     EP/Cardiology Office Visits:  02/14/2024 with HF clinic.   Recall 02/27/2024 with Dr Lawana Pray.  Nephrologist monitoring kidney function.   Copy of ICM check sent to Dr. Lawana Pray.      3 month ICM trend: 02/17/2024.    12-14 Month ICM trend:     Almyra Jain, RN 02/18/2024 7:19 AM

## 2024-02-18 NOTE — Progress Notes (Signed)
 Spoke patient and provided recommendations from [facility].  Advised to increase Torsemide  20 mg to 3 tablets (60 mg total) twice a day.   Also Increase Potassium 20 mEq to 3 tablets (60 mEq total) every morning and continue 2 tablets (40 mEq total) every afternoon.   She verbalized understanding and does not need a refill at this time.  Scheduled lab 6/25.  Instructed to send manual Remote Transmission on 6/25 for review and she agreed.

## 2024-02-21 ENCOUNTER — Ambulatory Visit: Attending: Cardiology

## 2024-02-21 DIAGNOSIS — Z95810 Presence of automatic (implantable) cardiac defibrillator: Secondary | ICD-10-CM

## 2024-02-21 DIAGNOSIS — I5022 Chronic systolic (congestive) heart failure: Secondary | ICD-10-CM

## 2024-02-21 NOTE — Progress Notes (Signed)
 EPIC Encounter for ICM Monitoring  Patient Name: Michelle Andrade is a 63 y.o. female Date: 02/21/2024 Primary Care Physican: Muriel Arm, MD Primary Cardiologist: Bensimhon Electrophysiologist: Camnitz Bi-V Pacing: >99%          05/17/2023 Weight: 171 lbs 07/23/2023 Weight: 156 lbs 10/02/2023 Weight: 159 lbs 12/11/2023 Weight: 157 lbs 02/14/2024 Office Weight: 161.9 lbs  02/18/2024 Weight: 163.6 lbs lbs today but after she has eaten and clothes on.  (baseline 157-158 lbs) 02/21/2024 Weight: 157 lbs                                                          Spoke with patient and heart failure questions reviewed.  Transmission results reviewed.  She reports stomach bloating have resolved and weight returned to baseline of 157 lbs after taking increased Torsemide  dosage of 60 mg bid.  Scheduled for Revision of total shoulder arthroplasty on 7/18.   Diet:  She does not limit salt or fluid intake.     CorVue thoracic impedance suggesting possible fluid accumulation starting 6/8 and returned to normal 6/20 after increase in Torsemide  to 60 mg bid.   Prescribed:  Torsemide  20 mg take 3 tablets (60 mg total) by mouth twice a day (increased 6/17). Potassium 20 mEq take 3 tablet (60 mEq total) by mouth every morning and 2 tablets (40 mEq total) every evening (morning dosage increase 6/17).   Labs: 02/26/2024 BMET Scheduled at HF clinic 12/05/2023 Creatinine 1.69, BUN 15, Potassium 4.1, Sodium 140, GFR 34 12/02/2023 Creatinine 1.67, BUN 18, Potassium 4.0, Sodium 139, GFR 34 08/26/2023 Creatinine 1.98, BUN 25, Potassium 4.4, Sodium 139, GFR 28 08/19/2023 Creatinine 2.11, BUN 29, Potassium 5.8, Sodium 136, GFR 26 08/05/2023 Creatinine 1.88, BUN 18, Potassium 4.4, Sodium 141, GFR 29 07/05/2023 Creatinine 1.87, BUN 17, Potassium 4.3, Sodium 138, GFR 30 04/12/2023 Creatinine 1.54, BUN 17, Potassium 4.0, Sodium 138, GFR 37 09/26/2022 Creatinine 1.57, BUN 18, Potassium 3.9, Sodium 138, GFR 37 A complete  set of results can be found in Results Review.   Recommendations:   Advised to send manual remote transmission to check stability of fluid levels on 6/25 and send to Ruddy Corral, PA for review.  No changes and encouraged to call if experiencing any fluid symptoms.   Follow-up plan: ICM clinic phone appointment on 03/30/2024.   91 day device clinic remote transmission 03/09/2024.     EP/Cardiology Office Visits:  03/30/2024 with HF clinic.   Recall 02/27/2024 with Dr Lawana Pray.  Nephrologist monitoring kidney function.   Copy of ICM check sent to Dr. Lawana Pray.      3 month ICM trend: 02/21/2024.    12-14 Month ICM trend:     Almyra Jain, RN 02/21/2024 10:56 AM

## 2024-02-21 NOTE — Progress Notes (Signed)
 Asked Michelle Andrade if follow up report needed 6/25 and she stated no follow up needed next week.

## 2024-02-21 NOTE — Progress Notes (Signed)
 Spoke with patient and advised no manual transmission is needed next week but she can call Dr Bensimhon's office next week if she has any problems.  She confirmed she will have labs drawn on 6/25

## 2024-02-26 ENCOUNTER — Ambulatory Visit (HOSPITAL_COMMUNITY): Payer: Self-pay | Admitting: Cardiology

## 2024-02-26 ENCOUNTER — Ambulatory Visit (HOSPITAL_COMMUNITY)
Admission: RE | Admit: 2024-02-26 | Discharge: 2024-02-26 | Disposition: A | Discharge status: Home / Self Care | Source: Ambulatory Visit | Attending: Internal Medicine | Admitting: Internal Medicine

## 2024-02-26 ENCOUNTER — Encounter

## 2024-02-26 DIAGNOSIS — Z95810 Presence of automatic (implantable) cardiac defibrillator: Secondary | ICD-10-CM | POA: Insufficient documentation

## 2024-02-26 DIAGNOSIS — I5022 Chronic systolic (congestive) heart failure: Secondary | ICD-10-CM | POA: Diagnosis present

## 2024-02-26 LAB — BASIC METABOLIC PANEL WITH GFR
Anion gap: 9 (ref 5–15)
BUN: 18 mg/dL (ref 8–23)
CO2: 27 mmol/L (ref 22–32)
Calcium: 9.3 mg/dL (ref 8.9–10.3)
Chloride: 104 mmol/L (ref 98–111)
Creatinine, Ser: 1.69 mg/dL — ABNORMAL HIGH (ref 0.44–1.00)
GFR, Estimated: 33 mL/min — ABNORMAL LOW (ref >60)
Glucose, Bld: 168 mg/dL — ABNORMAL HIGH (ref 70–99)
Potassium: 4.2 mmol/L (ref 3.5–5.1)
Sodium: 140 mmol/L (ref 135–145)

## 2024-03-09 ENCOUNTER — Encounter: Payer: Self-pay | Admitting: Cardiology

## 2024-03-09 ENCOUNTER — Other Ambulatory Visit (HOSPITAL_COMMUNITY): Payer: Self-pay

## 2024-03-09 ENCOUNTER — Ambulatory Visit (INDEPENDENT_AMBULATORY_CARE_PROVIDER_SITE_OTHER): Payer: Medicare Other

## 2024-03-09 DIAGNOSIS — I5022 Chronic systolic (congestive) heart failure: Secondary | ICD-10-CM

## 2024-03-09 LAB — CUP PACEART REMOTE DEVICE CHECK
Battery Remaining Longevity: 50 mo
Battery Remaining Percentage: 54 %
Battery Voltage: 2.95 V
Brady Statistic AP VP Percent: 5.7 %
Brady Statistic AP VS Percent: 1 %
Brady Statistic AS VP Percent: 94 %
Brady Statistic AS VS Percent: 1 %
Brady Statistic RA Percent Paced: 5.5 %
Date Time Interrogation Session: 20250707020114
HighPow Impedance: 71 Ohm
Implantable Lead Connection Status: 753985
Implantable Lead Connection Status: 753985
Implantable Lead Connection Status: 753985
Implantable Lead Implant Date: 20150915
Implantable Lead Implant Date: 20150915
Implantable Lead Implant Date: 20150915
Implantable Lead Location: 753858
Implantable Lead Location: 753859
Implantable Lead Location: 753860
Implantable Pulse Generator Implant Date: 20220107
Lead Channel Impedance Value: 390 Ohm
Lead Channel Impedance Value: 450 Ohm
Lead Channel Impedance Value: 450 Ohm
Lead Channel Pacing Threshold Amplitude: 0.625 V
Lead Channel Pacing Threshold Amplitude: 0.75 V
Lead Channel Pacing Threshold Amplitude: 1.375 V
Lead Channel Pacing Threshold Pulse Width: 0.4 ms
Lead Channel Pacing Threshold Pulse Width: 0.4 ms
Lead Channel Pacing Threshold Pulse Width: 0.4 ms
Lead Channel Sensing Intrinsic Amplitude: 12 mV
Lead Channel Sensing Intrinsic Amplitude: 2.8 mV
Lead Channel Setting Pacing Amplitude: 1.625
Lead Channel Setting Pacing Amplitude: 2.25 V
Lead Channel Setting Pacing Amplitude: 2.375
Lead Channel Setting Pacing Pulse Width: 0.4 ms
Lead Channel Setting Pacing Pulse Width: 0.4 ms
Lead Channel Setting Sensing Sensitivity: 0.5 mV
Pulse Gen Serial Number: 111036830

## 2024-03-27 ENCOUNTER — Telehealth (HOSPITAL_COMMUNITY): Payer: Self-pay

## 2024-03-27 NOTE — Progress Notes (Signed)
 Advanced [facility] Failure Clinic Note  PCP: Virgia Dorothe BRAVO, MD Nephrology: Dr Fleurette ([facility])  HF Cardiologist: Dr. Cherrie   HPI:  Michelle Andrade is a 63 y.o. female with a past medical history of HTN, chronic systolic HF due to NICM (normal cors on cath 2014, nonobs CAD on cath 5/21) s/p CRT-D ([employer]), atrial tachycardia (2017), CKD stage III, DM, OSA w/ CPAP.   Admitted 4/18 with ADHF at [facility]. Echo that admission with EF 25-30%.   Has angioedema with ACE-I and cough with ARB.    Echo 3/20 LVEF 25-30%   Admitted 5/21 LHC showed moderate, nonobstructive CAD w/ 50-60% RCA, flow wire 1.0 (non-flow limiting). Medical therapy recommended.  Echo 12/21 EF 25-30%   Echo 6/23 EF 35-40% (outside echo - images unavailable)  ICD changed in 1/22.  Echo 06/27/23 EF 35-40%   Low dose spiro started 10/24 but SCr 1.5>>2.1 and K 5.8 => spiro stopped.   Today she returns for HF follow up with her sister. Had shoulder surgery last month, had a spacer placed and did well. Planning to go back for permanent prosthetic in September. No SOB with activity. She has dizziness, no falls. Denies palpitations, abnormal bleeding, CP,  edema, or PND/Orthopnea. Appetite ok. She is not weighing at home. Taking all medications. Wearing BiPap.  Review of systems complete and found to be negative unless listed in HPI.    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   CAD (coronary artery disease) 09/01/2013   CHF (congestive heart failure) (HCC) 06/07/2015   CKD (chronic kidney disease), stage III (HCC) 12/09/2013   Depression 07/27/2013   Diabetes mellitus without complication (HCC) 09/27/2014   GERD (gastroesophageal reflux disease) 07/27/2013   Gout 12/29/2013   Heart valve disorder 06/03/2013   High cholesterol 11/10/2013   Hypertension 11/10/2013   IBS (irritable bowel syndrome)  09/27/2014   Myocardial infarct [facility])    Sleep apnea 10/13/2013   Vascular disorder 01/12/2014   Current Outpatient Medications  Medication Sig Dispense Refill   allopurinol  (ZYLOPRIM ) 100 MG tablet Take 200 mg by mouth daily.      amiodarone  (PACERONE ) 200 MG tablet Take 0.5 tablets (100 mg total) by mouth daily. 90 tablet 3   aspirin  81 MG chewable tablet Chew 81 mg by mouth daily.     atorvastatin  (LIPITOR) 80 MG tablet Take 1 tablet (80 mg total) by mouth at bedtime. 90 tablet 3   Calcium  Carbonate-Vitamin D 600-400 MG-UNIT tablet Take 1 tablet by mouth 2 (two) times daily.     carvedilol  (COREG ) 25 MG tablet Take 1 tablet (25 mg total) by mouth 2 (two) times daily with a meal. 180 tablet 3   colchicine  0.6 MG tablet Take 0.6 mg by mouth 2 (two) times daily as needed (Gout).     diclofenac  Sodium (VOLTAREN  ARTHRITIS PAIN) 1 % GEL Apply 1 g topically as needed.     Dulaglutide  (TRULICITY ) 1.5 MG/0.5ML SOAJ Inject 1.5 mg into the skin once a week. 2 mL 5   empagliflozin  (JARDIANCE ) 10 MG TABS tablet Take 1 tablet (10 mg total) by mouth daily before breakfast. 90 tablet 3   ferrous sulfate  325 (65 FE) MG tablet Take 1 tablet (325 mg total) by mouth 3 (three) times daily. 30 tablet 0   hydrALAZINE  (APRESOLINE ) 25 MG tablet Take 0.5 tablets (12.5 mg total) by mouth 3 (three) times daily. 135 tablet 3  isosorbide  mononitrate (IMDUR ) 30 MG 24 hr tablet Take 1 tablet (30 mg total) by mouth daily. 90 tablet 3   Magnesium Oxide 420 MG TABS Take 840 mg by mouth daily.     Multiple Vitamin (MULTIVITAMIN) tablet Take 1 tablet by mouth daily.     omeprazole (PRILOSEC) 40 MG capsule Take 40 mg by mouth daily.     potassium chloride  SA (KLOR-CON  M) 20 MEQ tablet Take 3 tablets (60 mEq total) by mouth every morning AND 2 tablets (40 mEq total) every evening.     rOPINIRole  (REQUIP ) 0.5 MG tablet Take 0.5 mg by mouth at bedtime.      sertraline  (ZOLOFT ) 100 MG tablet Patient takes 2 tablet at bedtime.  (Patient taking differently: Take 200 mg by mouth at bedtime. Patient takes 2 tablet at bedtime.)     torsemide  (DEMADEX ) 20 MG tablet Take 3 tablets (60 mg total) by mouth 2 (two) times daily.     traMADol (ULTRAM) 50 MG tablet Take 50 mg by mouth. (Patient taking differently: Take 50 mg by mouth every 6 (six) hours as needed.)     No current facility-administered medications for this encounter.   Allergies  Allergen Reactions   Codeine Itching   Gabapentin Itching   Haldol [Haloperidol Lactate] Other (See Comments)    Dizziness, hallucinations   Levofloxacin     Other reaction(s): Other (See Comments) Projectile vomiting    Lisinopril Itching and Swelling   Losartan Itching   Morphine And Codeine Nausea And Vomiting   Penicillins Nausea And Vomiting    Vomiting, upset stomach, Headache Has patient had a PCN reaction causing immediate rash, facial/tongue/throat swelling, SOB or lightheadedness with hypotension: No Has patient had a PCN reaction causing severe rash involving mucus membranes or skin necrosis: No Has patient had a PCN reaction that required hospitalization: No Has patient had a PCN reaction occurring within the last 10 years: No If all of the above answers are NO, then may proceed with Cephalosporin use.    Haloperidol Other (See Comments)   Other Other (See Comments)   Oxycodone Other (See Comments)   Social History   Socioeconomic History   Marital status: Single    Spouse name: Not on file   Number of children: Not on file   Years of education: Not on file   Highest education level: Not on file  Occupational History   Not on file  Tobacco Use   Smoking status: Never   Smokeless tobacco: Never  Vaping Use   Vaping status: Never Used  Substance and Sexual Activity   Alcohol use: No   Drug use: No   Sexual activity: Yes  Other Topics Concern   Not on file  Social History Narrative   Not on file   Social Drivers of Health   Financial Resource  Strain: Not on file  Food Insecurity: Not on file  Transportation Needs: Not on file  Physical Activity: Not on file  Stress: Not on file  Social Connections: Not on file  Intimate Partner Violence: Not on file   Family History  Problem Relation Age of Onset   Hypertension Mother    BP 138/70   Pulse 77   Ht 5' 3 (1.6 m)   Wt 74.8 kg (165 lb)   SpO2 98%   BMI 29.23 kg/m   Wt Readings from Last 3 Encounters:  03/30/24 74.8 kg (165 lb)  02/14/24 73.3 kg (161 lb 9.6 oz)  12/16/23 72.1 kg (159  lb)   PHYSICAL EXAM: General:  NAD. No resp difficulty, walked into clinic HEENT: Normal Neck: Supple. No JVD. Cor: Regular rate & rhythm. No rubs, gallops or murmurs. Lungs: Clear Abdomen: Soft, nontender, nondistended.  Extremities: No cyanosis, clubbing, rash, edema; R arm in sling Neuro: Alert & oriented x 3, moves all 4 extremities w/o difficulty. Affect pleasant.  ICD interrogation (personally reviewed): CorVue stable, >99% BV pacing, 5.9% AP,  no VT, <1% AT/AF  ECG (personally reviewed): A sensed V paced, 71 bpm  ASSESSMENT & PLAN: 1. Chronic Systolic Heart Faliure:  - NICM, normal cors in 2014, likely related to HTN vs. Noncompliance vs. Tachy- medicated with history of atrial tach. - s/p STJ CRT-D - Echo 6/18: EF 20%, moderate MR, moderate TR. PA pressure 80 mm Hg.    - Echo 3/20: EF 25-30% - Cath 5/21: Nonobstructive CAD RCA 60% lesion - Echo 12/21: EF 25-30% RV ok  - Echo 6/23: EF 35-40% (outside echo - images unavailable) - Echo 10/24: EF 35-40%  - NYHA II, stable. Volume looks good today.  - Continue compression hose - Continue torsemide  60 mg bid + KCL 60/40 - Continue hydralazine  12.5 mg tid + Imdur  30 mg daily. - Continue carvedilol  25 mg bid.  - Continue Jardiance  10 mg daily. Denies GU symptoms.  - No ARB/ ANRi due to angioedema/CKD. Had itching with losartan - Off spiro with hyperkalemia and AKI - Labs today. - Update echo at next visit.  2. CAD,  non-obstructive - LHC 01/2020 50-60% RCA, negative FFR (non-flow limiting). - No chest pain - Continue  ASA + statin + Imdur  + ? blocker.  3. HLD - Continue atorva 80 mg daily. - Goal LDL < 70 - Followed by PCP.  4. CKD stage IIIa  - Baseline SCr 1.5-1.8 - Continue SGLT2i - Follows with Dr. Fleurette - BMET today.  5. HTN - BP up a bit in clinic. - Continue current meds. Check bp daily and log. Notify clinic if sBP> 140, would then go back up on hydralazine . - Labs today.  6. History of atrial tachycardia - Followed by Dr. Inocencio  - Continue amiodarone  100 mg daily. Will need regular eye exam - Device interrogation today shows <1% AT/AF  Follow up in 3 months with Dr. Cherrie + echo.  Harlene HERO [facility] [place], [name] 03/30/24

## 2024-03-27 NOTE — Telephone Encounter (Signed)
 Called to confirm/remind patient of their appointment at the Advanced [facility] Failure Clinic on 03/30/24.   Appointment:   [] Confirmed  [] Left mess   [] No answer/No voice mail  [] VM Full/unable to leave message  [] Phone not in service  And to bring in all medications and/or complete list.

## 2024-03-30 ENCOUNTER — Encounter (HOSPITAL_COMMUNITY): Payer: Self-pay

## 2024-03-30 ENCOUNTER — Ambulatory Visit (HOSPITAL_COMMUNITY): Payer: Self-pay | Admitting: Family Medicine

## 2024-03-30 ENCOUNTER — Ambulatory Visit: Attending: Cardiology

## 2024-03-30 ENCOUNTER — Ambulatory Visit (HOSPITAL_COMMUNITY)
Admission: RE | Admit: 2024-03-30 | Discharge: 2024-03-30 | Disposition: A | Discharge status: Home / Self Care | Source: Ambulatory Visit | Attending: Family Medicine | Admitting: Family Medicine

## 2024-03-30 VITALS — BP 138/70 | HR 77 | Ht 63.0 in | Wt 165.0 lb

## 2024-03-30 DIAGNOSIS — Z7984 Long term (current) use of oral hypoglycemic drugs: Secondary | ICD-10-CM | POA: Insufficient documentation

## 2024-03-30 DIAGNOSIS — I428 Other cardiomyopathies: Secondary | ICD-10-CM | POA: Diagnosis not present

## 2024-03-30 DIAGNOSIS — N1831 Chronic kidney disease, stage 3a: Secondary | ICD-10-CM | POA: Insufficient documentation

## 2024-03-30 DIAGNOSIS — Z7985 Long-term (current) use of injectable non-insulin antidiabetic drugs: Secondary | ICD-10-CM | POA: Diagnosis not present

## 2024-03-30 DIAGNOSIS — I10 Essential (primary) hypertension: Secondary | ICD-10-CM | POA: Diagnosis not present

## 2024-03-30 DIAGNOSIS — Z79899 Other long term (current) drug therapy: Secondary | ICD-10-CM | POA: Diagnosis not present

## 2024-03-30 DIAGNOSIS — R059 Cough, unspecified: Secondary | ICD-10-CM | POA: Insufficient documentation

## 2024-03-30 DIAGNOSIS — E785 Hyperlipidemia, unspecified: Secondary | ICD-10-CM | POA: Diagnosis not present

## 2024-03-30 DIAGNOSIS — I130 Hypertensive heart and chronic kidney disease with heart failure and stage 1 through stage 4 chronic kidney disease, or unspecified chronic kidney disease: Secondary | ICD-10-CM | POA: Diagnosis not present

## 2024-03-30 DIAGNOSIS — T783XXA Angioneurotic edema, initial encounter: Secondary | ICD-10-CM | POA: Insufficient documentation

## 2024-03-30 DIAGNOSIS — Z95810 Presence of automatic (implantable) cardiac defibrillator: Secondary | ICD-10-CM

## 2024-03-30 DIAGNOSIS — N183 Chronic kidney disease, stage 3 unspecified: Secondary | ICD-10-CM

## 2024-03-30 DIAGNOSIS — I4719 Other supraventricular tachycardia: Secondary | ICD-10-CM | POA: Insufficient documentation

## 2024-03-30 DIAGNOSIS — I5022 Chronic systolic (congestive) heart failure: Secondary | ICD-10-CM | POA: Diagnosis not present

## 2024-03-30 DIAGNOSIS — I2510 Atherosclerotic heart disease of native coronary artery without angina pectoris: Secondary | ICD-10-CM | POA: Insufficient documentation

## 2024-03-30 DIAGNOSIS — E1122 Type 2 diabetes mellitus with diabetic chronic kidney disease: Secondary | ICD-10-CM | POA: Diagnosis not present

## 2024-03-30 DIAGNOSIS — G4733 Obstructive sleep apnea (adult) (pediatric): Secondary | ICD-10-CM | POA: Diagnosis not present

## 2024-03-30 LAB — BASIC METABOLIC PANEL WITH GFR
Anion gap: 12 (ref 5–15)
BUN: 50 mg/dL — ABNORMAL HIGH (ref 8–23)
CO2: 23 mmol/L (ref 22–32)
Calcium: 9.3 mg/dL (ref 8.9–10.3)
Chloride: 105 mmol/L (ref 98–111)
Creatinine, Ser: 1.7 mg/dL — ABNORMAL HIGH (ref 0.44–1.00)
GFR, Estimated: 33 mL/min — ABNORMAL LOW (ref >60)
Glucose, Bld: 184 mg/dL — ABNORMAL HIGH (ref 70–99)
Potassium: 3.9 mmol/L (ref 3.5–5.1)
Sodium: 140 mmol/L (ref 135–145)

## 2024-03-30 NOTE — Patient Instructions (Addendum)
 Good to see you today!  Labs done today, your results will be available in MyChart, we will contact you for abnormal readings.  Your physician has requested that you have an echocardiogram. Echocardiography is a painless test that uses sound waves to create images of your heart. It provides your doctor with information about the size and shape of your heart and how well your heart's chambers and valves are working. This procedure takes approximately one hour. There are no restrictions for this procedure. Please do NOT wear cologne, perfume, aftershave, or lotions (deodorant is allowed). Please arrive 15 minutes prior to your appointment time.  Please note: We ask at that you not bring children with you during ultrasound (echo/ vascular) testing. Due to room size and safety concerns, children are not allowed in the ultrasound rooms during exams. Our front office staff cannot provide observation of children in our lobby area while testing is being conducted. An adult accompanying a patient to their appointment will only be allowed in the ultrasound room at the discretion of the ultrasound technician under special circumstances. We apologize for any inconvenience.  Your physician recommends that you schedule a follow-up appointment 3 months with echocardiogram(October) Call office in August to schedule an appointment  If you have any questions or concerns before your next appointment please send us  a message through [place] or call our office at [phone].    TO LEAVE A MESSAGE FOR THE NURSE SELECT OPTION 2, PLEASE LEAVE A MESSAGE INCLUDING: YOUR NAME DATE OF BIRTH CALL BACK NUMBER REASON FOR CALL**this is important as we prioritize the call backs  YOU WILL RECEIVE A CALL BACK THE SAME DAY AS LONG AS YOU CALL BEFORE 4:00 PM At the [facility], you and your health needs are our priority. As part of our continuing mission to provide you with exceptional heart care, we have  created designated Provider Care Teams. These Care Teams include your primary Cardiologist (physician) and Advanced Practice Providers (APPs- Physician Assistants and Nurse Practitioners) who all work together to provide you with the care you need, when you need it.   You may see any of the following providers on your designated Care Team at your next follow up: Dr Toribio Fuel Dr Ezra Shuck Dr. Ria Commander Dr. Morene Brownie Amy Lenetta, NP Caffie Shed, [place] [facility] [place], [place] Beckey Coe, NP [place] Lee, NP Ellouise Class, NP Tinnie Redman, PharmD Jaun Bash, PharmD   Please be sure to bring in all your medications bottles to every appointment.    Thank you for choosing [facility]

## 2024-04-02 NOTE — Progress Notes (Signed)
 EPIC Encounter for ICM Monitoring  Patient Name: Michelle Andrade is a 63 y.o. female Date: 04/02/2024 Primary Care Physican: Virgia Dorothe BRAVO, MD Primary Cardiologist: Bensimhon Electrophysiologist: Camnitz Bi-V Pacing: >99%          05/17/2023 Weight: 171 lbs 07/23/2023 Weight: 156 lbs 10/02/2023 Weight: 159 lbs 12/11/2023 Weight: 157 lbs 02/14/2024 Office Weight: 161.9 lbs  02/18/2024 Weight: 163.6 lbs lbs today but after she has eaten and clothes on.  (baseline 157-158 lbs) 02/21/2024 Weight: 157 lbs 03/30/2024 Office Weight: 165 lbs                                                          Transmission results reviewed.  Pt seen in HF clinic this week.   Diet:  She does not limit salt or fluid intake.     CorVue thoracic impedance suggesting normal fluid levels with the exception of possible fluid accumulation from 7/13-7/16 and 7/20-723.   Prescribed:  Torsemide  20 mg take 3 tablets (60 mg total) by mouth twice a day (increased 6/17). Potassium 20 mEq take 3 tablet (60 mEq total) by mouth every morning and 2 tablets (40 mEq total) every evening (morning dosage increase 6/17).   Labs: 03/30/2024 Creatinine 1.70, BUN 50, Potassium 3.9, Sodium 140, GFR 33 02/26/2024 Creatinine 1.69, BUN 18, Potassium 4.2, Sodium 140, GFR 33 12/05/2023 Creatinine 1.69, BUN 15, Potassium 4.1, Sodium 140, GFR 34 12/02/2023 Creatinine 1.67, BUN 18, Potassium 4.0, Sodium 139, GFR 34 08/26/2023 Creatinine 1.98, BUN 25, Potassium 4.4, Sodium 139, GFR 28 08/19/2023 Creatinine 2.11, BUN 29, Potassium 5.8, Sodium 136, GFR 26 08/05/2023 Creatinine 1.88, BUN 18, Potassium 4.4, Sodium 141, GFR 29 07/05/2023 Creatinine 1.87, BUN 17, Potassium 4.3, Sodium 138, GFR 30 04/12/2023 Creatinine 1.54, BUN 17, Potassium 4.0, Sodium 138, GFR 37 09/26/2022 Creatinine 1.57, BUN 18, Potassium 3.9, Sodium 138, GFR 37 A complete set of results can be found in Results Review.   Recommendations:   Recommendations given at 03/30/2024 HF  clinic OV   Follow-up plan: ICM clinic phone appointment on 05/05/2024.   91 day device clinic remote transmission 06/08/2024.     EP/Cardiology Office Visits:  Recall 06/28/2024 with Dr Cherrie.   Recall 02/27/2024 with Dr Inocencio.  Nephrologist monitoring kidney function.   Copy of ICM check sent to Dr. Inocencio.      3 month ICM trend: 03/30/2024.    12-14 Month ICM trend:     [name], RN 04/02/2024 3:43 PM

## 2024-04-04 ENCOUNTER — Ambulatory Visit: Payer: Self-pay | Admitting: Cardiology

## 2024-04-06 ENCOUNTER — Encounter

## 2024-04-07 ENCOUNTER — Other Ambulatory Visit (HOSPITAL_COMMUNITY): Payer: Self-pay

## 2024-04-09 ENCOUNTER — Encounter: Payer: Self-pay | Admitting: Internal Medicine

## 2024-04-24 ENCOUNTER — Encounter (HOSPITAL_COMMUNITY): Payer: Self-pay

## 2024-04-27 ENCOUNTER — Telehealth (HOSPITAL_COMMUNITY): Payer: Self-pay

## 2024-04-27 ENCOUNTER — Other Ambulatory Visit (HOSPITAL_COMMUNITY): Payer: Self-pay

## 2024-04-27 ENCOUNTER — Encounter (HOSPITAL_COMMUNITY): Payer: Self-pay | Admitting: Internal Medicine

## 2024-04-27 NOTE — Telephone Encounter (Signed)
 LATE DOCUMENTATION ON 04/24/24  Received a fax requesting medical records from [facility] . Records were successfully faxed to: [phone] ,which was the number provided. Medical request form will be scanned into patients chart.  Phone number: [phone]

## 2024-04-28 ENCOUNTER — Other Ambulatory Visit (HOSPITAL_COMMUNITY): Payer: Self-pay

## 2024-05-05 ENCOUNTER — Ambulatory Visit: Attending: Cardiology

## 2024-05-05 DIAGNOSIS — I5022 Chronic systolic (congestive) heart failure: Secondary | ICD-10-CM | POA: Diagnosis not present

## 2024-05-05 DIAGNOSIS — Z95810 Presence of automatic (implantable) cardiac defibrillator: Secondary | ICD-10-CM | POA: Diagnosis not present

## 2024-05-05 NOTE — Progress Notes (Signed)
 EPIC Encounter for ICM Monitoring  Patient Name: Michelle Andrade is a 63 y.o. female Date: 05/05/2024 Primary Care Physican: Virgia Dorothe BRAVO, MD Primary Cardiologist: Bensimhon Electrophysiologist: Camnitz Bi-V Pacing: >99%          05/17/2023 Weight: 171 lbs 07/23/2023 Weight: 156 lbs 10/02/2023 Weight: 159 lbs 12/11/2023 Weight: 157 lbs 02/14/2024 Office Weight: 161.9 lbs  02/18/2024 Weight: 163.6 lbs lbs today but after she has eaten and clothes on.  (baseline 157-158 lbs) 02/21/2024 Weight: 157 lbs 03/30/2024 Office Weight: 165 lbs 05/05/2024 Weight: 159 lbs                                                          Spoke with patient and heart failure questions reviewed.  Transmission results reviewed.  Pt had should surgery 8/28 and have increased fluid intake due to taking antibiotics and pain pain.  She does not have any fluid symptoms.     Diet:  05/05/2024 Drinking more than recommended amount of 64 oz at this time due to recent surgery.  She is drinking protein drinks and increase fluid intake due to taking antibiotics.    CorVue thoracic impedance suggesting possible fluid accumulation starting 8/29 which correlates with 8/28 shoulder surgery but is trending back toward normal    Prescribed:  Torsemide  20 mg take 3 tablets (60 mg total) by mouth twice a day (increased 6/17). Potassium 20 mEq take 3 tablet (60 mEq total) by mouth every morning and 2 tablets (40 mEq total) every evening (morning dosage increase 6/17).   Labs: 03/30/2024 Creatinine 1.70, BUN 50, Potassium 3.9, Sodium 140, GFR 33 02/26/2024 Creatinine 1.69, BUN 18, Potassium 4.2, Sodium 140, GFR 33 12/05/2023 Creatinine 1.69, BUN 15, Potassium 4.1, Sodium 140, GFR 34 12/02/2023 Creatinine 1.67, BUN 18, Potassium 4.0, Sodium 139, GFR 34 08/26/2023 Creatinine 1.98, BUN 25, Potassium 4.4, Sodium 139, GFR 28 08/19/2023 Creatinine 2.11, BUN 29, Potassium 5.8, Sodium 136, GFR 26 08/05/2023 Creatinine 1.88, BUN 18, Potassium 4.4,  Sodium 141, GFR 29 07/05/2023 Creatinine 1.87, BUN 17, Potassium 4.3, Sodium 138, GFR 30 04/12/2023 Creatinine 1.54, BUN 17, Potassium 4.0, Sodium 138, GFR 37 09/26/2022 Creatinine 1.57, BUN 18, Potassium 3.9, Sodium 138, GFR 37 A complete set of results can be found in Results Review.   Recommendations:  Advised to slowly decrease fluid intake when possible and cal li she experiences any fluid symptoms.    Follow-up plan: ICM clinic phone appointment on 05/12/2024 to recheck fluid levels.   91 day device clinic remote transmission 06/08/2024.     EP/Cardiology Office Visits:  Recall 06/28/2024 with Dr Cherrie.   Recall 02/27/2024 with Dr Inocencio.  Nephrologist monitoring kidney function.   Copy of ICM check sent to Dr. Inocencio.       3 month ICM trend: 05/05/2024.    12-14 Month ICM trend:     [name], RN 05/05/2024 4:09 PM

## 2024-05-11 ENCOUNTER — Other Ambulatory Visit: Payer: Self-pay

## 2024-05-12 ENCOUNTER — Ambulatory Visit: Attending: Cardiology

## 2024-05-12 ENCOUNTER — Telehealth: Payer: Self-pay

## 2024-05-12 DIAGNOSIS — I5022 Chronic systolic (congestive) heart failure: Secondary | ICD-10-CM

## 2024-05-12 DIAGNOSIS — Z95810 Presence of automatic (implantable) cardiac defibrillator: Secondary | ICD-10-CM

## 2024-05-12 NOTE — Progress Notes (Addendum)
 Spoke with patient and she reports will have another surgery for dislocated shoulder on 9/8.   Her [facility] has recommended that she change the Torsemide  to 20 mg 2 tablets (40 mg total) twice a day and Potassium 20 mEq 2 tablets (40 mEq total) twice a day.  If she has a 3 lb or more weight gain then she takes Potassium 3 tablets in the morning and 2 tablets in the after noon with Torsemide  60 mg twice a day.    Weight today was 164 lbs today and she took Torsemide  60 mg twice today and Potassium 3 tablets in AM and 2 tablets in the afternoon.  She will continue to follow the Cardiologist [facility] physicians recommendations of Torsemide  and Potassium.    She continues to drink extra fluid due to taking antibiotics.    She will continue to weigh daily and adjust Torsemide  and Potassium as instructed.

## 2024-05-12 NOTE — Progress Notes (Signed)
 EPIC Encounter for ICM Monitoring  Patient Name: Michelle Andrade is a 63 y.o. female Date: 05/12/2024 Primary Care Physican: Virgia Dorothe BRAVO, MD Primary Cardiologist: Bensimhon Electrophysiologist: Camnitz Bi-V Pacing: >99%          05/17/2023 Weight: 171 lbs 07/23/2023 Weight: 156 lbs 02/18/2024 Weight: 163.6 lbs lbs today but after she has eaten and clothes on.  (baseline 157-158 lbs) 02/21/2024 Weight: 157 lbs 03/30/2024 Office Weight: 165 lbs 05/05/2024 Weight: 159 lbs                                                          Attempted call to patient and unable to reach.   Transmission results reviewed.      Diet:  05/05/2024 Drinking more than recommended amount of 64 oz at this time due to recent surgery.  She is drinking protein drinks and increase fluid intake due to taking antibiotics.    CorVue thoracic impedance suggesting possible fluid accumulation starting 8/29 but improving and trending closer to normal.    Prescribed:  Torsemide  20 mg take 3 tablets (60 mg total) by mouth twice a day (increased 6/17). Potassium 20 mEq take 3 tablet (60 mEq total) by mouth every morning and 2 tablets (40 mEq total) every evening (morning dosage increase 6/17).   Labs: 03/30/2024 Creatinine 1.70, BUN 50, Potassium 3.9, Sodium 140, GFR 33 02/26/2024 Creatinine 1.69, BUN 18, Potassium 4.2, Sodium 140, GFR 33 12/05/2023 Creatinine 1.69, BUN 15, Potassium 4.1, Sodium 140, GFR 34 12/02/2023 Creatinine 1.67, BUN 18, Potassium 4.0, Sodium 139, GFR 34 08/26/2023 Creatinine 1.98, BUN 25, Potassium 4.4, Sodium 139, GFR 28 08/19/2023 Creatinine 2.11, BUN 29, Potassium 5.8, Sodium 136, GFR 26 08/05/2023 Creatinine 1.88, BUN 18, Potassium 4.4, Sodium 141, GFR 29 07/05/2023 Creatinine 1.87, BUN 17, Potassium 4.3, Sodium 138, GFR 30 04/12/2023 Creatinine 1.54, BUN 17, Potassium 4.0, Sodium 138, GFR 37 09/26/2022 Creatinine 1.57, BUN 18, Potassium 3.9, Sodium 138, GFR 37 A complete set of results can be found  in Results Review.   Recommendations:  Unable to reach.     Follow-up plan: ICM clinic phone appointment on 05/19/2024 to recheck fluid levels.   91 day device clinic remote transmission 06/08/2024.     EP/Cardiology Office Visits:  Recall 06/28/2024 with Dr Cherrie.   Recall 02/27/2024 with Dr Inocencio.  Nephrologist monitoring kidney function.   Copy of ICM check sent to Dr. Inocencio.     3 month ICM trend: 05/12/2024.    12-14 Month ICM trend:     [name], RN 05/12/2024 1:40 PM

## 2024-05-12 NOTE — Telephone Encounter (Signed)
 Remote ICM transmission received.  Attempted call to patient regarding ICM remote transmission and no answer.

## 2024-05-19 ENCOUNTER — Ambulatory Visit: Attending: Cardiology

## 2024-05-19 DIAGNOSIS — Z95810 Presence of automatic (implantable) cardiac defibrillator: Secondary | ICD-10-CM

## 2024-05-19 DIAGNOSIS — I5022 Chronic systolic (congestive) heart failure: Secondary | ICD-10-CM

## 2024-05-19 NOTE — Progress Notes (Signed)
 EPIC Encounter for ICM Monitoring  Patient Name: Shamaya Kauer is a 63 y.o. female Date: 05/19/2024 Primary Care Physican: Virgia Dorothe BRAVO, MD Primary Cardiologist: Bensimhon Electrophysiologist: Camnitz Bi-V Pacing: >99%          05/17/2023 Weight: 171 lbs 07/23/2023 Weight: 156 lbs 02/18/2024 Weight: 163.6 lbs lbs today but after she has eaten and clothes on.  (baseline 157-158 lbs) 02/21/2024 Weight: 157 lbs 03/30/2024 Office Weight: 165 lbs 05/05/2024 Weight: 159 lbs  (baseline 157-158 lbs)                                                           Spoke with patient and heart failure questions reviewed.  Transmission results reviewed.  Pt asymptomatic for fluid accumulation.  Reports feeling well at this time and voices no complaints.   She decided to follow Dr Shelli recommendations for Torsemide  and Potassium rather than the [facility] cardiologist to adjust dosage based on weight.    Diet:  N/A   CorVue thoracic impedance suggesting fluid levels returned to normal 9/9.    Prescribed:  Torsemide  20 mg take 3 tablets (60 mg total) by mouth twice a day.   Potassium 20 mEq take 3 tablet (60 mEq total) by mouth every morning and 2 tablets (40 mEq total) every evening.    Labs: 03/30/2024 Creatinine 1.70, BUN 50, Potassium 3.9, Sodium 140, GFR 33 02/26/2024 Creatinine 1.69, BUN 18, Potassium 4.2, Sodium 140, GFR 33 12/05/2023 Creatinine 1.69, BUN 15, Potassium 4.1, Sodium 140, GFR 34 12/02/2023 Creatinine 1.67, BUN 18, Potassium 4.0, Sodium 139, GFR 34 08/26/2023 Creatinine 1.98, BUN 25, Potassium 4.4, Sodium 139, GFR 28 08/19/2023 Creatinine 2.11, BUN 29, Potassium 5.8, Sodium 136, GFR 26 08/05/2023 Creatinine 1.88, BUN 18, Potassium 4.4, Sodium 141, GFR 29 07/05/2023 Creatinine 1.87, BUN 17, Potassium 4.3, Sodium 138, GFR 30 04/12/2023 Creatinine 1.54, BUN 17, Potassium 4.0, Sodium 138, GFR 37 09/26/2022 Creatinine 1.57, BUN 18, Potassium 3.9, Sodium 138, GFR 37 A complete set of results  can be found in Results Review.   Recommendations:  No changes and encouraged to call if experiencing any fluid symptoms.   Follow-up plan: ICM clinic phone appointment on 06/15/2024.   91 day device clinic remote transmission 06/08/2024.     EP/Cardiology Office Visits:  Recall 06/28/2024 with Dr Cherrie.   Recall 02/27/2024 with Dr Inocencio.  Nephrologist monitoring kidney function.   Copy of ICM check sent to Dr. Inocencio.     3 month ICM trend: 05/19/2024.    12-14 Month ICM trend:     [name], RN 05/19/2024 7:10 AM

## 2024-06-08 ENCOUNTER — Ambulatory Visit (INDEPENDENT_AMBULATORY_CARE_PROVIDER_SITE_OTHER): Payer: Medicare Other

## 2024-06-08 DIAGNOSIS — I5022 Chronic systolic (congestive) heart failure: Secondary | ICD-10-CM

## 2024-06-09 LAB — CUP PACEART REMOTE DEVICE CHECK
Battery Remaining Longevity: 47 mo
Battery Remaining Percentage: 51 %
Battery Voltage: 2.95 V
Brady Statistic AP VP Percent: 5.7 %
Brady Statistic AP VS Percent: 1 %
Brady Statistic AS VP Percent: 94 %
Brady Statistic AS VS Percent: 1 %
Brady Statistic RA Percent Paced: 5.5 %
Date Time Interrogation Session: 20251006021356
HighPow Impedance: 70 Ohm
Implantable Lead Connection Status: 753985
Implantable Lead Connection Status: 753985
Implantable Lead Connection Status: 753985
Implantable Lead Implant Date: 20150915
Implantable Lead Implant Date: 20150915
Implantable Lead Implant Date: 20150915
Implantable Lead Location: 753858
Implantable Lead Location: 753859
Implantable Lead Location: 753860
Implantable Pulse Generator Implant Date: 20220107
Lead Channel Impedance Value: 380 Ohm
Lead Channel Impedance Value: 430 Ohm
Lead Channel Impedance Value: 430 Ohm
Lead Channel Pacing Threshold Amplitude: 0.625 V
Lead Channel Pacing Threshold Amplitude: 0.75 V
Lead Channel Pacing Threshold Amplitude: 1.25 V
Lead Channel Pacing Threshold Pulse Width: 0.4 ms
Lead Channel Pacing Threshold Pulse Width: 0.4 ms
Lead Channel Pacing Threshold Pulse Width: 0.4 ms
Lead Channel Sensing Intrinsic Amplitude: 12 mV
Lead Channel Sensing Intrinsic Amplitude: 3 mV
Lead Channel Setting Pacing Amplitude: 1.625
Lead Channel Setting Pacing Amplitude: 2.25 V
Lead Channel Setting Pacing Amplitude: 2.25 V
Lead Channel Setting Pacing Pulse Width: 0.4 ms
Lead Channel Setting Pacing Pulse Width: 0.4 ms
Lead Channel Setting Sensing Sensitivity: 0.5 mV
Pulse Gen Serial Number: 111036830

## 2024-06-09 NOTE — Progress Notes (Signed)
 Remote ICD Transmission

## 2024-06-12 ENCOUNTER — Other Ambulatory Visit (HOSPITAL_COMMUNITY): Payer: Self-pay

## 2024-06-12 ENCOUNTER — Ambulatory Visit: Payer: Self-pay | Admitting: Cardiology

## 2024-06-15 ENCOUNTER — Ambulatory Visit: Attending: Cardiology

## 2024-06-15 DIAGNOSIS — I5022 Chronic systolic (congestive) heart failure: Secondary | ICD-10-CM

## 2024-06-15 DIAGNOSIS — Z95810 Presence of automatic (implantable) cardiac defibrillator: Secondary | ICD-10-CM

## 2024-06-15 NOTE — Progress Notes (Signed)
 Remote ICD Transmission

## 2024-06-17 NOTE — Progress Notes (Signed)
 EPIC Encounter for ICM Monitoring  Patient Name: Michelle Andrade is a 63 y.o. female Date: 06/17/2024 Primary Care Physican: Virgia Dorothe BRAVO, MD Primary Cardiologist: Bensimhon Electrophysiologist: Camnitz Bi-V Pacing: >99%          05/17/2023 Weight: 171 lbs 07/23/2023 Weight: 156 lbs 02/18/2024 Weight: 163.6 lbs lbs today but after she has eaten and clothes on.  (baseline 157-158 lbs) 02/21/2024 Weight: 157 lbs 03/30/2024 Office Weight: 165 lbs 05/05/2024 Weight: 159 lbs  (baseline 157-158 lbs) 06/15/2024 Weight: 159 lbs                                                            Spoke with patient and heart failure questions reviewed.  Transmission results reviewed.  Pt asymptomatic for fluid accumulation.  Reports feeling well at this time and voices no complaints.   2nd shoulder surgery on 05/22/2024 correlates with decreased impedance.   Diet:  N/A   Since 05/19/2024 ICM Remote Transmission:  CorVue thoracic impedance suggesting possible fluid accumulation from 05/23/2024-05/30/2024 (surgery) followed by possible dryness 05/31/2024-06/09/2024 and returned to normal 06/10/2024.    Prescribed:  Torsemide  20 mg take 3 tablets (60 mg total) by mouth twice a day.   Potassium 20 mEq take 3 tablet (60 mEq total) by mouth every morning and 2 tablets (40 mEq total) every evening.    Labs: 03/30/2024 Creatinine 1.70, BUN 50, Potassium 3.9, Sodium 140, GFR 33 02/26/2024 Creatinine 1.69, BUN 18, Potassium 4.2, Sodium 140, GFR 33 12/05/2023 Creatinine 1.69, BUN 15, Potassium 4.1, Sodium 140, GFR 34 12/02/2023 Creatinine 1.67, BUN 18, Potassium 4.0, Sodium 139, GFR 34 A complete set of results can be found in Results Review.   Recommendations:  No changes and encouraged to call if experiencing any fluid symptoms.   Follow-up plan: ICM clinic phone appointment on 07/20/2024.   91 day device clinic remote transmission 09/07/2024.     EP/Cardiology Office Visits:  Recall 06/28/2024 with Dr Cherrie.    Recall 02/27/2024 with Dr Inocencio.  Nephrologist monitoring kidney function.   Copy of ICM check sent to Dr. Inocencio.    Remote monitoring is medically necessary for Heart Failure Management.    Daily Thoracic Impedance ICM trend: 03/17/2024 through 06/15/2024.    12-14 Month Thoracic Impedance ICM trend:     [name], RN 06/17/2024 5:02 PM

## 2024-07-03 ENCOUNTER — Emergency Department (HOSPITAL_COMMUNITY)
Admission: EM | Admit: 2024-07-03 | Discharge: 2024-07-03 | Disposition: A | Discharge status: Home / Self Care | Source: Home / Self Care | Attending: Emergency Medicine | Admitting: Emergency Medicine

## 2024-07-03 ENCOUNTER — Emergency Department (HOSPITAL_BASED_OUTPATIENT_CLINIC_OR_DEPARTMENT_OTHER)

## 2024-07-03 ENCOUNTER — Encounter (HOSPITAL_BASED_OUTPATIENT_CLINIC_OR_DEPARTMENT_OTHER): Payer: Self-pay | Admitting: Emergency Medicine

## 2024-07-03 ENCOUNTER — Other Ambulatory Visit: Payer: Self-pay

## 2024-07-03 ENCOUNTER — Emergency Department (HOSPITAL_COMMUNITY)

## 2024-07-03 ENCOUNTER — Emergency Department (HOSPITAL_BASED_OUTPATIENT_CLINIC_OR_DEPARTMENT_OTHER)
Admission: EM | Admit: 2024-07-03 | Discharge: 2024-07-03 | Disposition: A | Discharge status: Home / Self Care | Attending: Emergency Medicine | Admitting: Emergency Medicine

## 2024-07-03 ENCOUNTER — Encounter (HOSPITAL_COMMUNITY): Payer: Self-pay

## 2024-07-03 DIAGNOSIS — Z79899 Other long term (current) drug therapy: Secondary | ICD-10-CM | POA: Diagnosis not present

## 2024-07-03 DIAGNOSIS — I509 Heart failure, unspecified: Secondary | ICD-10-CM | POA: Insufficient documentation

## 2024-07-03 DIAGNOSIS — E1122 Type 2 diabetes mellitus with diabetic chronic kidney disease: Secondary | ICD-10-CM | POA: Insufficient documentation

## 2024-07-03 DIAGNOSIS — Z7982 Long term (current) use of aspirin: Secondary | ICD-10-CM | POA: Insufficient documentation

## 2024-07-03 DIAGNOSIS — R079 Chest pain, unspecified: Secondary | ICD-10-CM | POA: Insufficient documentation

## 2024-07-03 DIAGNOSIS — R072 Precordial pain: Secondary | ICD-10-CM

## 2024-07-03 DIAGNOSIS — I2510 Atherosclerotic heart disease of native coronary artery without angina pectoris: Secondary | ICD-10-CM | POA: Diagnosis not present

## 2024-07-03 DIAGNOSIS — I110 Hypertensive heart disease with heart failure: Secondary | ICD-10-CM | POA: Insufficient documentation

## 2024-07-03 DIAGNOSIS — Z7984 Long term (current) use of oral hypoglycemic drugs: Secondary | ICD-10-CM | POA: Diagnosis not present

## 2024-07-03 DIAGNOSIS — I130 Hypertensive heart and chronic kidney disease with heart failure and stage 1 through stage 4 chronic kidney disease, or unspecified chronic kidney disease: Secondary | ICD-10-CM | POA: Insufficient documentation

## 2024-07-03 DIAGNOSIS — N183 Chronic kidney disease, stage 3 unspecified: Secondary | ICD-10-CM | POA: Insufficient documentation

## 2024-07-03 DIAGNOSIS — R0789 Other chest pain: Secondary | ICD-10-CM | POA: Diagnosis not present

## 2024-07-03 LAB — CBC
HCT: 42.8 % (ref 36.0–46.0)
Hemoglobin: 13.8 g/dL (ref 12.0–15.0)
MCH: 30.6 pg (ref 26.0–34.0)
MCHC: 32.2 g/dL (ref 30.0–36.0)
MCV: 94.9 fL (ref 80.0–100.0)
Platelets: 210 K/uL (ref 150–400)
RBC: 4.51 MIL/uL (ref 3.87–5.11)
RDW: 14.7 % (ref 11.5–15.5)
WBC: 4.3 K/uL (ref 4.0–10.5)
nRBC: 0 % (ref 0.0–0.2)

## 2024-07-03 LAB — BASIC METABOLIC PANEL WITH GFR
Anion gap: 13 (ref 5–15)
Anion gap: 12 (ref 5–15)
BUN: 23 mg/dL (ref 8–23)
BUN: 20 mg/dL (ref 8–23)
CO2: 24 mmol/L (ref 22–32)
CO2: 22 mmol/L (ref 22–32)
Calcium: 9.1 mg/dL (ref 8.9–10.3)
Calcium: 8.9 mg/dL (ref 8.9–10.3)
Chloride: 104 mmol/L (ref 98–111)
Chloride: 103 mmol/L (ref 98–111)
Creatinine, Ser: 1.46 mg/dL — ABNORMAL HIGH (ref 0.44–1.00)
Creatinine, Ser: 1.63 mg/dL — ABNORMAL HIGH (ref 0.44–1.00)
GFR, Estimated: 40 mL/min — ABNORMAL LOW (ref >60)
GFR, Estimated: 35 mL/min — ABNORMAL LOW (ref >60)
Glucose, Bld: 160 mg/dL — ABNORMAL HIGH (ref 70–99)
Glucose, Bld: 136 mg/dL — ABNORMAL HIGH (ref 70–99)
Potassium: 3.9 mmol/L (ref 3.5–5.1)
Potassium: 4.2 mmol/L (ref 3.5–5.1)
Sodium: 141 mmol/L (ref 135–145)
Sodium: 137 mmol/L (ref 135–145)

## 2024-07-03 LAB — TROPONIN I (HIGH SENSITIVITY)
Troponin I (High Sensitivity): 38 ng/L — ABNORMAL HIGH (ref <18)
Troponin I (High Sensitivity): 36 ng/L — ABNORMAL HIGH (ref <18)

## 2024-07-03 LAB — CBC WITH DIFFERENTIAL/PLATELET
Abs Immature Granulocytes: 0.01 K/uL (ref 0.00–0.07)
Basophils Absolute: 0 K/uL (ref 0.0–0.1)
Basophils Relative: 1 %
Eosinophils Absolute: 0.3 K/uL (ref 0.0–0.5)
Eosinophils Relative: 6 %
HCT: 40.2 % (ref 36.0–46.0)
Hemoglobin: 13.3 g/dL (ref 12.0–15.0)
Immature Granulocytes: 0 %
Lymphocytes Relative: 39 %
Lymphs Abs: 1.7 K/uL (ref 0.7–4.0)
MCH: 31.1 pg (ref 26.0–34.0)
MCHC: 33.1 g/dL (ref 30.0–36.0)
MCV: 94.1 fL (ref 80.0–100.0)
Monocytes Absolute: 0.5 K/uL (ref 0.1–1.0)
Monocytes Relative: 10 %
Neutro Abs: 2 K/uL (ref 1.7–7.7)
Neutrophils Relative %: 44 %
Platelets: 192 K/uL (ref 150–400)
RBC: 4.27 MIL/uL (ref 3.87–5.11)
RDW: 14.7 % (ref 11.5–15.5)
WBC: 4.5 K/uL (ref 4.0–10.5)
nRBC: 0 % (ref 0.0–0.2)

## 2024-07-03 LAB — TROPONIN T, HIGH SENSITIVITY
Troponin T High Sensitivity: 26 ng/L — ABNORMAL HIGH (ref 0–19)
Troponin T High Sensitivity: 24 ng/L — ABNORMAL HIGH (ref 0–19)

## 2024-07-03 LAB — PRO BRAIN NATRIURETIC PEPTIDE: Pro Brain Natriuretic Peptide: 290 pg/mL (ref <300.0)

## 2024-07-03 MED ORDER — IOHEXOL 350 MG/ML SOLN
80.0000 mL | Freq: Once | INTRAVENOUS | Status: AC | PRN
  Administered 2024-07-03: 60 mL via INTRAVENOUS

## 2024-07-03 MED ORDER — ONDANSETRON HCL 4 MG/2ML IJ SOLN
4.0000 mg | Freq: Once | INTRAMUSCULAR | Status: AC
  Administered 2024-07-03: 4 mg via INTRAVENOUS
  Filled 2024-07-03: qty 2

## 2024-07-03 MED ORDER — ACETAMINOPHEN 500 MG PO TABS
1000.0000 mg | ORAL_TABLET | Freq: Once | ORAL | Status: AC
  Administered 2024-07-03: 1000 mg via ORAL
  Filled 2024-07-03: qty 2

## 2024-07-03 MED ORDER — ALUM & MAG HYDROXIDE-SIMETH 200-200-20 MG/5ML PO SUSP
30.0000 mL | Freq: Once | ORAL | Status: AC
  Administered 2024-07-03: 30 mL via ORAL
  Filled 2024-07-03: qty 30

## 2024-07-03 MED ORDER — CYCLOBENZAPRINE HCL 10 MG PO TABS
5.0000 mg | ORAL_TABLET | Freq: Once | ORAL | Status: AC
  Administered 2024-07-03: 5 mg via ORAL
  Filled 2024-07-03: qty 1

## 2024-07-03 MED ORDER — ASPIRIN 81 MG PO CHEW
324.0000 mg | CHEWABLE_TABLET | ORAL | Status: AC
  Administered 2024-07-03: 324 mg via ORAL
  Filled 2024-07-03: qty 4

## 2024-07-03 NOTE — ED Provider Triage Note (Signed)
 Emergency Medicine Provider Triage Evaluation Note  Michelle Andrade , a 63 y.o. female  was evaluated in triage.  Pt complains of Chest pain since this morning  Review of Systems  Positive: Chest pain Negative: Shortness of breath, syncope, dizziness, weakness,  Physical Exam  BP 138/65 (BP Location: Right Arm)   Pulse 66   Temp 97.9 F (36.6 C)   Resp 16   Ht 5' 3 (1.6 m)   Wt 71.2 kg   SpO2 97%   BMI 27.81 kg/m  Gen:   Awake, no distress   Resp:  Normal effort  MSK:   Moves extremities without difficulty  Other:    Medical Decision Making  Medically screening exam initiated at 4:59 PM.  Appropriate orders placed.  Michelle Andrade was informed that the remainder of the evaluation will be completed by another provider, this initial triage assessment does not replace that evaluation, and the importance of remaining in the ED until their evaluation is complete.  Patient was seen at [facility] today for chest pain.  Patient had a negative CTA, EKG, chest x-ray.  Patient reported troponins were mildly elevated at 26 but delta troponin was negative.  Patient was advised to follow-up with cardiology outpatient and reports earliest appointment was November 10.  Patient was told to return to ED if she had worsening pain.  Patient reports when she went home pain increased and she came to ED for evaluation.   Myriam Fonda RAMAN, [place] 07/03/24 1701

## 2024-07-03 NOTE — ED Triage Notes (Signed)
 Pt reports CP since last night. Pt seen at [facility] last night and was told to f/u with cardiology. Pt states that they don't have any appts until next week and she is still having pain.

## 2024-07-03 NOTE — ED Triage Notes (Signed)
 Chest pain, started last night, pt took home meds thinking I would help and went to sleep. Woke up to worse pain.

## 2024-07-03 NOTE — ED Provider Notes (Signed)
 Patient care signed out to follow-up repeat troponin and CT angiogram results.  Fortunately repeat troponin similar to the first.  Patient well-appearing smiling no significant symptoms at this time.  CT angiogram results reviewed no blood clots.  Discussed importance of following up with patient's cardiologist for next steps in management given her risk factors and history.     Tonia Chew, MD 07/03/24 [phone]

## 2024-07-03 NOTE — ED Provider Notes (Signed)
 [place] Provider Note   CSN: [id number] Arrival date & time: 07/03/24  0445     Patient presents with: Chest Pain   Michelle Andrade is a 63 y.o. female.   The history is provided by the patient.  Chest Pain Pain location:  L chest Pain quality comment:  Not sharp but unable to better characterize Pain radiates to:  Does not radiate Pain severity:  Moderate Onset quality:  Sudden Duration: hours. Progression:  Unchanged Chronicity:  New Context: at rest   Relieved by:  Nothing Worsened by:  Nothing Ineffective treatments:  None tried Associated symptoms: shortness of breath   Associated symptoms: no back pain, no fever, no nausea, no palpitations, no PND, no syncope, no vomiting and no weakness   Risk factors: not female and no prior DVT/PE   Patient with ICD and CHF who is s/p shoulder surgery on 9/19 presents with CP left of center.  No DOE.  No nausea, vomiting or diaphoresis.     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   CAD (coronary artery disease) 09/01/2013   CHF (congestive heart failure) (HCC) 06/07/2015   CKD (chronic kidney disease), stage III (HCC) 12/09/2013   Depression 07/27/2013   Diabetes mellitus without complication (HCC) 09/27/2014   GERD (gastroesophageal reflux disease) 07/27/2013   Gout 12/29/2013   Heart valve disorder 06/03/2013   High cholesterol 11/10/2013   Hypertension 11/10/2013   IBS (irritable bowel syndrome) 09/27/2014   Myocardial infarct [facility])    Sleep apnea 10/13/2013   Vascular disorder 01/12/2014  \  Prior to Admission medications   Medication Sig Start Date End Date Taking? Authorizing Provider  allopurinol  (ZYLOPRIM ) 100 MG tablet Take 200 mg by mouth daily.     [provider]  amiodarone  (PACERONE ) 200 MG tablet Take 0.5 tablets (100 mg total) by mouth daily.  02/18/20   Clegg, Amy D, NP  aspirin  81 MG chewable tablet Chew 81 mg by mouth daily.    [provider]  atorvastatin  (LIPITOR) 80 MG tablet Take 1 tablet (80 mg total) by mouth at bedtime. 01/12/20   Marcine Catalan M, PA-C  Calcium  Carbonate-Vitamin D 600-400 MG-UNIT tablet Take 1 tablet by mouth 2 (two) times daily. 07/14/12   [provider]  carvedilol  (COREG ) 25 MG tablet Take 1 tablet (25 mg total) by mouth 2 (two) times daily with a meal. 02/19/20   Bensimhon, Toribio SAUNDERS, MD  colchicine  0.6 MG tablet Take 0.6 mg by mouth 2 (two) times daily as needed (Gout). 12/09/18   [provider]  diclofenac  Sodium (VOLTAREN  ARTHRITIS PAIN) 1 % GEL Apply 1 g topically as needed.    [provider]  Dulaglutide  (TRULICITY ) 1.5 MG/0.5ML SOAJ Inject 1.5 mg into the skin once a week. 01/22/24     empagliflozin  (JARDIANCE ) 10 MG TABS tablet Take 1 tablet (10 mg total) by mouth daily before breakfast. 10/11/20   Bensimhon, Toribio SAUNDERS, MD  ferrous sulfate  325 (65 FE) MG tablet Take 1 tablet (325 mg total) by mouth 3 (three) times daily. 01/18/23   Bensimhon, Toribio SAUNDERS, MD  hydrALAZINE  (APRESOLINE ) 25 MG tablet Take 0.5 tablets (12.5 mg total) by mouth 3 (three) times daily. 12/16/23   Marcine Catalan M, PA-C  isosorbide  mononitrate (IMDUR ) 30 MG 24 hr tablet Take 1 tablet (30 mg total) by mouth daily. 09/26/22   Bensimhon, Toribio SAUNDERS, MD  Magnesium Oxide 420 MG TABS Take 840 mg by mouth daily.    [provider]  Multiple Vitamin (MULTIVITAMIN) tablet Take 1 tablet by mouth daily.    [provider]  omeprazole (PRILOSEC) 40 MG capsule Take 40 mg by mouth daily.    [provider]  potassium chloride  SA (KLOR-CON  M) 20 MEQ tablet Take 3 tablets (60 mEq total) by mouth every morning AND 2 tablets (40 mEq total) every evening. 02/18/24   Marcine Catalan M, PA-C  rOPINIRole  (REQUIP ) 0.5 MG tablet Take 0.5 mg by mouth at bedtime.     [provider]   sertraline  (ZOLOFT ) 100 MG tablet Patient takes 2 tablet at bedtime. Patient taking differently: Take 200 mg by mouth at bedtime. Patient takes 2 tablet at bedtime.    [provider]  torsemide  (DEMADEX ) 20 MG tablet Take 3 tablets (60 mg total) by mouth 2 (two) times daily. 02/18/24   Marcine Catalan M, PA-C  traMADol (ULTRAM) 50 MG tablet Take 50 mg by mouth. Patient taking differently: Take 50 mg by mouth every 6 (six) hours as needed. 12/05/23   [provider]    Allergies: Codeine, Gabapentin, Haldol [haloperidol lactate], Levofloxacin, Lisinopril, Losartan, Morphine and codeine, Penicillins, Haloperidol, Other, and Oxycodone    Review of Systems  Constitutional:  Negative for fever.  Respiratory:  Positive for shortness of breath.   Cardiovascular:  Positive for chest pain. Negative for palpitations, leg swelling, syncope and PND.  Gastrointestinal:  Negative for nausea and vomiting.  Musculoskeletal:  Negative for back pain.  Neurological:  Negative for weakness.  All other systems reviewed and are negative.   Updated Vital Signs BP (!) 135/57   Pulse 62   Temp 98.1 F (36.7 C) (Oral)   Resp 17   Ht 5' 3 (1.6 m)   Wt 71.2 kg   SpO2 98%   BMI 27.81 kg/m   Physical Exam Vitals and nursing note reviewed. Exam conducted with a chaperone present.  Constitutional:      General: She is not in acute distress.    Appearance: Normal appearance. She is well-developed.  HENT:     Head: Normocephalic and atraumatic.     Nose: Nose normal.  Eyes:     Pupils: Pupils are equal, round, and reactive to light.  Cardiovascular:     Rate and Rhythm: Normal rate and regular rhythm.     Pulses: Normal pulses.     Heart sounds: Normal heart sounds.  Pulmonary:     Effort: Pulmonary effort is normal. No respiratory distress.     Breath sounds: Normal breath sounds. No wheezing, rhonchi or rales.  Abdominal:     General: Bowel sounds are normal. There is no  distension.     Palpations: Abdomen is soft.     Tenderness: There is no abdominal tenderness. There is no guarding or rebound.  Musculoskeletal:        General: Normal range of motion.     Cervical back: Neck supple.  Skin:    General: Skin is warm and dry.     Capillary Refill: Capillary refill takes less than 2 seconds.     Findings: No erythema or rash.  Neurological:     General: No focal deficit present.     Mental Status: She is alert and oriented to person, place, and time.     Deep Tendon Reflexes: Reflexes normal.  Psychiatric:        Mood and Affect: Mood normal.     (  all labs ordered are listed, but only abnormal results are displayed) Results for orders placed or performed during the hospital encounter of 07/03/24  CBC with Differential   Collection Time: 07/03/24  5:23 AM  Result Value Ref Range   WBC 4.5 4.0 - 10.5 K/uL   RBC 4.27 3.87 - 5.11 MIL/uL   Hemoglobin 13.3 12.0 - 15.0 g/dL   HCT 59.7 63.9 - 53.9 %   MCV 94.1 80.0 - 100.0 fL   MCH 31.1 26.0 - 34.0 pg   MCHC 33.1 30.0 - 36.0 g/dL   RDW 85.2 88.4 - 84.4 %   Platelets 192 150 - 400 K/uL   nRBC 0.0 0.0 - 0.2 %   Neutrophils Relative % 44 %   Neutro Abs 2.0 1.7 - 7.7 K/uL   Lymphocytes Relative 39 %   Lymphs Abs 1.7 0.7 - 4.0 K/uL   Monocytes Relative 10 %   Monocytes Absolute 0.5 0.1 - 1.0 K/uL   Eosinophils Relative 6 %   Eosinophils Absolute 0.3 0.0 - 0.5 K/uL   Basophils Relative 1 %   Basophils Absolute 0.0 0.0 - 0.1 K/uL   Immature Granulocytes 0 %   Abs Immature Granulocytes 0.01 0.00 - 0.07 K/uL   DG Chest Portable 1 View Result Date: 07/03/2024 EXAM: 1 VIEW(S) XRAY OF THE CHEST 07/03/2024 05:17:11 AM COMPARISON: Portable chest 11/18/2021. CLINICAL HISTORY: painetc painetc FINDINGS: LINES, TUBES AND DEVICES: Left chest dual lead pacing system with aid wiring. LUNGS AND PLEURA: Lobulated lucent-centered calcifications in the anterior chest wall consistent with fat necrosis, superimposing in  the lower lung fields, best visualized on CTA chest 11/15/2016. No focal pulmonary opacity. No pulmonary edema. No pleural effusion. No pneumothorax. HEART AND MEDIASTINUM: Calcification in the transverse aorta with normal mediastinal configuration. BONES AND SOFT TISSUES: Bilateral shoulder replacements with no new osseous findings. IMPRESSION: 1. No acute cardiopulmonary findings: Stable chest. Electronically signed by: Francis Quam MD 07/03/2024 05:33 AM EDT RP Workstation: [id number]   CUP PACEART REMOTE DEVICE CHECK Result Date: 06/09/2024 ICD Scheduled remote reviewed. Normal device function.  Presenting rhythm: AS/BiVP. Next remote transmission per protocol. MC, CVRS    EKG: EKG Interpretation Date/Time:  Friday July 03 2024 04:51:37 EDT Ventricular Rate:  74 PR Interval:  134 QRS Duration:  139 QT Interval:  462 QTC Calculation: 513 R Axis:   267  Text Interpretation: Right and left arm electrode reversal, interpretation assumes no reversal ATRIAL PACED RHYTHM Confirmed by Adren Dollins ([id number]) on 07/03/2024 4:54:53 AM  Radiology: [facility] Chest Portable 1 View Result Date: 07/03/2024 EXAM: 1 VIEW(S) XRAY OF THE CHEST 07/03/2024 05:17:11 AM COMPARISON: Portable chest 11/18/2021. CLINICAL HISTORY: painetc painetc FINDINGS: LINES, TUBES AND DEVICES: Left chest dual lead pacing system with aid wiring. LUNGS AND PLEURA: Lobulated lucent-centered calcifications in the anterior chest wall consistent with fat necrosis, superimposing in the lower lung fields, best visualized on CTA chest 11/15/2016. No focal pulmonary opacity. No pulmonary edema. No pleural effusion. No pneumothorax. HEART AND MEDIASTINUM: Calcification in the transverse aorta with normal mediastinal configuration. BONES AND SOFT TISSUES: Bilateral shoulder replacements with no new osseous findings. IMPRESSION: 1. No acute cardiopulmonary findings: Stable chest. Electronically signed by: Francis Quam MD 07/03/2024 05:33 AM EDT  RP Workstation: [id number]     Procedures   Medications Ordered in the ED  alum & mag hydroxide-simeth (MAALOX/MYLANTA) 200-200-20 MG/5ML suspension 30 mL (30 mLs Oral Given 07/03/24 0530)  aspirin  chewable tablet 324 mg (324 mg Oral Given 07/03/24 0530)  ondansetron  (  ZOFRAN ) injection 4 mg (4 mg Intravenous Given 07/03/24 0531)                                    Medical Decision Making Patient with sudden onset Cp at rest.    Amount and/or Complexity of Data Reviewed External Data Reviewed: ECG and notes.    Details: Previous notes and EKG reviewed  Labs: ordered.    Details: Normal white count 4.5, normal hemoglobin 13.3, normal platelets 192K.  Cardiac labs sent and pending at this time time  Radiology: ordered and independent interpretation performed.    Details: Normal CXR by me, CTA ordered  ECG/medicine tests: ordered and independent interpretation performed.  Risk OTC drugs. Prescription drug management. Risk Details: Labs hemolyzed x 2.  Third set being sent at this time.     Final diagnoses:  None   Signed out to Dr. Tonia pending labs and imaging  ED Discharge Orders     None          Latecia Miler, MD 07/03/24 [phone]

## 2024-07-03 NOTE — Discharge Instructions (Addendum)
 You were seen today for chest pain. While you were here we monitored your vitals, preformed a physical exam, and blood work. These were all reassuring and there is no indication for any further testing or intervention in the emergency department at this time.   Things to do:  - Follow up with your cardiologist within the next 1-2 weeks - Take Tylenol  1000 mg every 6 hours as needed for chest wall pain  Return to the emergency department if you have any new or worsening symptoms including shortness of breath, passing out, pain radiating to your back, or if you have any other concerns.

## 2024-07-03 NOTE — Discharge Instructions (Signed)
 Follow-up closely with your cardiologist call for soonest available appointment.

## 2024-07-03 NOTE — ED Provider Notes (Signed)
 [place] EMERGENCY DEPARTMENT AT [facility] Provider Note   CSN: [id number] Arrival date & time: 07/03/24  1554     Patient presents with: Chest Pain   Michelle Andrade is a 63 y.o. female with past medical history of CHF, hypertension, and CAD presenting to the Emergency Department for chest pain.  Patient reports she was seen this morning at [facility] where she was evaluated for chest pain.  She was told to follow-up with cardiology however she was unable to get an appointment until next week.  Patient reports she was having ongoing chest pain and presented to [facility] for further evaluation.  Patient endorses left-sided chest pain that is worse when she coughs or takes deep breath.  She denies shortness of breath, nausea, vomiting, diaphoresis, or dyspnea exertion.    Chest Pain      Prior to Admission medications   Medication Sig Start Date End Date Taking? Authorizing Provider  allopurinol  (ZYLOPRIM ) 100 MG tablet Take 200 mg by mouth daily.     [provider]  amiodarone  (PACERONE ) 200 MG tablet Take 0.5 tablets (100 mg total) by mouth daily. 02/18/20   Clegg, Amy D, NP  aspirin  81 MG chewable tablet Chew 81 mg by mouth daily.    [provider]  atorvastatin  (LIPITOR) 80 MG tablet Take 1 tablet (80 mg total) by mouth at bedtime. 01/12/20   Marcine Caffie HERO, PA-C  Calcium  Carbonate-Vitamin D 600-400 MG-UNIT tablet Take 1 tablet by mouth 2 (two) times daily. 07/14/12   [provider]  carvedilol  (COREG ) 25 MG tablet Take 1 tablet (25 mg total) by mouth 2 (two) times daily with a meal. 02/19/20   Bensimhon, Toribio SAUNDERS, MD  colchicine  0.6 MG tablet Take 0.6 mg by mouth 2 (two) times daily as needed (Gout). 12/09/18   [provider]  diclofenac  Sodium (VOLTAREN  ARTHRITIS PAIN) 1 % GEL Apply 1 g topically as needed.    [provider]  Dulaglutide  (TRULICITY ) 1.5 MG/0.5ML SOAJ Inject 1.5 mg into the skin once a week. 01/22/24      empagliflozin  (JARDIANCE ) 10 MG TABS tablet Take 1 tablet (10 mg total) by mouth daily before breakfast. 10/11/20   Bensimhon, Toribio SAUNDERS, MD  ferrous sulfate  325 (65 FE) MG tablet Take 1 tablet (325 mg total) by mouth 3 (three) times daily. 01/18/23   Bensimhon, Toribio SAUNDERS, MD  hydrALAZINE  (APRESOLINE ) 25 MG tablet Take 0.5 tablets (12.5 mg total) by mouth 3 (three) times daily. 12/16/23   Marcine Caffie M, PA-C  isosorbide  mononitrate (IMDUR ) 30 MG 24 hr tablet Take 1 tablet (30 mg total) by mouth daily. 09/26/22   Bensimhon, Daniel R, MD  Magnesium Oxide 420 MG TABS Take 840 mg by mouth daily.    [provider]  Multiple Vitamin (MULTIVITAMIN) tablet Take 1 tablet by mouth daily.    [provider]  omeprazole (PRILOSEC) 40 MG capsule Take 40 mg by mouth daily.    [provider]  potassium chloride  SA (KLOR-CON  M) 20 MEQ tablet Take 3 tablets (60 mEq total) by mouth every morning AND 2 tablets (40 mEq total) every evening. 02/18/24   Marcine Caffie M, PA-C  rOPINIRole  (REQUIP ) 0.5 MG tablet Take 0.5 mg by mouth at bedtime.     [provider]  sertraline  (ZOLOFT ) 100 MG tablet Patient takes 2 tablet at bedtime. Patient taking differently: Take 200 mg by mouth at bedtime. Patient takes 2 tablet at bedtime.    [provider]  torsemide  (DEMADEX ) 20 MG tablet Take 3 tablets (60 mg total) by mouth 2 (two) times daily. 02/18/24   Marcine Caffie HERO, PA-C    Allergies: Codeine, Gabapentin, Haldol [haloperidol lactate], Levofloxacin, Lisinopril, Losartan, Morphine and codeine, Penicillins, Haloperidol, Other, and Oxycodone    Review of Systems  Cardiovascular:  Positive for chest pain.    Updated Vital Signs BP 117/73 (BP Location: Left Arm)   Pulse 66   Temp 97.9 F (36.6 C) (Oral)   Resp 18   Ht 5' 3 (1.6 m)   Wt 71.2 kg   SpO2 100%   BMI 27.81 kg/m   Physical Exam Vitals and nursing note reviewed.  Constitutional:      General: She  is not in acute distress.    Appearance: She is well-developed.  HENT:     Head: Normocephalic and atraumatic.  Eyes:     Conjunctiva/sclera: Conjunctivae normal.  Cardiovascular:     Rate and Rhythm: Normal rate and regular rhythm.     Heart sounds: No murmur heard. Pulmonary:     Effort: Pulmonary effort is normal. No respiratory distress.     Breath sounds: Normal breath sounds.  Chest:     Chest wall: Tenderness (Tenderness to palpation over left chest wall) present.  Abdominal:     Palpations: Abdomen is soft.     Tenderness: There is no abdominal tenderness.  Musculoskeletal:        General: No swelling.     Cervical back: Neck supple.  Skin:    General: Skin is warm and dry.  Neurological:     Mental Status: She is alert.     (all labs ordered are listed, but only abnormal results are displayed) Labs Reviewed  BASIC METABOLIC PANEL WITH GFR - Abnormal; Notable for the following components:      Result Value   Glucose, Bld 136 (*)    Creatinine, Ser 1.63 (*)    GFR, Estimated 35 (*)    All other components within normal limits  TROPONIN I (HIGH SENSITIVITY) - Abnormal; Notable for the following components:   Troponin I (High Sensitivity) 38 (*)    All other components within normal limits  TROPONIN I (HIGH SENSITIVITY) - Abnormal; Notable for the following components:   Troponin I (High Sensitivity) 36 (*)    All other components within normal limits  CBC    EKG: EKG Interpretation Date/Time:  Friday July 03 2024 16:02:39 EDT Ventricular Rate:  70 PR Interval:  172 QRS Duration:  96 QT Interval:  396 QTC Calculation: 427 R Axis:   229  Text Interpretation: Atrial-sensed ventricular-paced rhythm Biventricular pacemaker detected Abnormal ECG When compared with ECG of 03-Jul-2024 04:55, PREVIOUS ECG IS PRESENT Confirmed by Pamella Sharper [phone]) on 07/03/2024 7:17:15 PM  Radiology: [facility] Chest 1 View Result Date: 07/03/2024 CLINICAL DATA:  Chest pain  EXAM: CHEST  1 VIEW COMPARISON:  07/03/2024 chest x-ray and CT, 11/18/2021 FINDINGS: Lateral views of the chest demonstrate no focal opacity or pleural effusion. Shoulder replacements. IMPRESSION: No active disease. Electronically Signed   By: Luke Bun M.D.   On: 07/03/2024 17:59   CT Angio Chest PE W and/or Wo Contrast Result Date: 07/03/2024 CLINICAL DATA:  Acute chest pain. Presyncope. Clinical suspicion for pulmonary embolism. EXAM: CT ANGIOGRAPHY CHEST WITH CONTRAST TECHNIQUE: Multidetector CT imaging of the chest was performed using the standard protocol during bolus administration of intravenous contrast. Multiplanar CT image reconstructions and MIPs were obtained to evaluate the  vascular anatomy. RADIATION DOSE REDUCTION: This exam was performed according to the departmental dose-optimization program which includes automated exposure control, adjustment of the mA and/or kV according to patient size and/or use of iterative reconstruction technique. CONTRAST:  60mL OMNIPAQUE  IOHEXOL  350 MG/ML SOLN COMPARISON:  11/15/2016 FINDINGS: Cardiovascular: Satisfactory opacification of pulmonary arteries noted, and no pulmonary emboli identified. No evidence of thoracic aortic dissection or aneurysm. Stable mild cardiomegaly and left ventricular enlargement. Triple lead pacemaker again seen. Small right-sided pericardial cyst again noted. Aortic and coronary atherosclerotic calcification noted. Mediastinum/Nodes: No masses or pathologically enlarged lymph nodes identified. Lungs/Pleura: Patchy ground-glass opacities are seen bilaterally, predominantly in the upper lung zones. This has decreased since previous study, is nonspecific, but likely due to chronic inflammatory disease. No evidence of pulmonary fibrosis, consolidation, or pleural effusion. Upper abdomen: Moderate hiatal hernia again noted. Musculoskeletal: No suspicious bone lesions identified. Streak artifact from bilateral shoulder prostheses through  upper thorax. Review of the MIP images confirms the above findings. IMPRESSION: No evidence of pulmonary embolism. Patchy ground-glass opacities in both lungs, decreased since previous study. This is nonspecific, but likely due to chronic inflammatory disease. Moderate hiatal hernia. Electronically Signed   By: Norleen DELENA Kil M.D.   On: 07/03/2024 08:00   DG Chest Portable 1 View Result Date: 07/03/2024 EXAM: 1 VIEW(S) XRAY OF THE CHEST 07/03/2024 05:17:11 AM COMPARISON: Portable chest 11/18/2021. CLINICAL HISTORY: painetc painetc FINDINGS: LINES, TUBES AND DEVICES: Left chest dual lead pacing system with aid wiring. LUNGS AND PLEURA: Lobulated lucent-centered calcifications in the anterior chest wall consistent with fat necrosis, superimposing in the lower lung fields, best visualized on CTA chest 11/15/2016. No focal pulmonary opacity. No pulmonary edema. No pleural effusion. No pneumothorax. HEART AND MEDIASTINUM: Calcification in the transverse aorta with normal mediastinal configuration. BONES AND SOFT TISSUES: Bilateral shoulder replacements with no new osseous findings. IMPRESSION: 1. No acute cardiopulmonary findings: Stable chest. Electronically signed by: Francis Quam MD 07/03/2024 05:33 AM EDT RP Workstation: [id number]     Procedures   Medications Ordered in the ED  acetaminophen  (TYLENOL ) tablet 1,000 mg (1,000 mg Oral Given 07/03/24 1752)  cyclobenzaprine (FLEXERIL) tablet 5 mg (5 mg Oral Given 07/03/24 1752)                                    Medical Decision Making Patient is a 63 year old female presenting to the emergency department for ongoing chest pain.  She was seen last night earlier today at [facility] for the same symptoms.  There she had a reassuring cardiac workup with normal EKG, no delta troponin, and a negative CTA without signs of PE.  Patient presents again today for the same pain that continues without resolution.  On evaluation, she is hemodynamically stable with  reproducible chest wall tenderness to palpation that is worse with deep inspiration.  Patient received aspirin  this morning at [facility].  Differential diagnosis includes but limited to ACS, PE, musculoskeletal pain, pneumonia  Patient given Tylenol  and Flexeril for treatment of chest wall tenderness  EKG I obtained reveals ventricular paced rhythm, no evidence of STEMI, New-onset Arrhythmia, or ischemic equivalent. Therefore do not suspect ACS at this time. No concerns for Pericardial Tamponade on EKG and in light of patients hemodynamic stability doubt this pathology. No pain related to supine or prone positions and given EKG doubt Pericarditis.   CXR unremarkable for focal airspace disease, patient is afebrile and no leukocytosis,  do not suspect Pneumonia. CXR without evidence of Pneumothorax.   Unlikely Pulmonary Embolism patient had a negative CTA chest this morning. Pain is not described as tearing and does not radiate to back, no pulse deficit, no neurologic complaints. CXR does not show widened mediastinum. Doubt Aortic Dissection.  Initial troponin elevated to 38 which is consistent with patient's baseline, repeat troponin 36, no significant delta increase  On reevaluation, patient endorses full resolution of chest pain at this time.  Given reassuring workup and resolution of pain, patient presentation is consistent with musculoskeletal chest wall pain. Patient remained hemodynamically stable throughout her time in the emergency department.  Patient has previously scheduled outpatient follow-up with cardiology.  Strict return precautions were given and patient was discharged home in stable condition.  Amount and/or Complexity of Data Reviewed External Data Reviewed: labs, radiology, ECG and notes. Labs: ordered. Radiology: ordered and independent interpretation performed. ECG/medicine tests: ordered and independent interpretation performed.  Risk OTC drugs. Prescription drug  management.        Final diagnoses:  Chest wall pain    ED Discharge Orders     None          Sharlet Dowdy, MD 07/04/24 0023    Pamella Ozell LABOR, DO 07/07/24 [id number]

## 2024-07-06 ENCOUNTER — Encounter (HOSPITAL_COMMUNITY): Payer: Self-pay | Admitting: Internal Medicine

## 2024-07-12 ENCOUNTER — Other Ambulatory Visit (HOSPITAL_COMMUNITY): Payer: Self-pay

## 2024-07-13 ENCOUNTER — Other Ambulatory Visit (HOSPITAL_COMMUNITY): Payer: Self-pay

## 2024-07-13 ENCOUNTER — Ambulatory Visit (HOSPITAL_COMMUNITY)
Admission: RE | Admit: 2024-07-13 | Discharge: 2024-07-13 | Disposition: A | Source: Ambulatory Visit | Attending: Cardiology

## 2024-07-13 ENCOUNTER — Other Ambulatory Visit: Payer: Self-pay

## 2024-07-13 VITALS — BP 142/72 | HR 66 | Ht 63.0 in | Wt 165.0 lb

## 2024-07-13 DIAGNOSIS — I5022 Chronic systolic (congestive) heart failure: Secondary | ICD-10-CM | POA: Insufficient documentation

## 2024-07-13 DIAGNOSIS — I4719 Other supraventricular tachycardia: Secondary | ICD-10-CM | POA: Diagnosis not present

## 2024-07-13 DIAGNOSIS — E875 Hyperkalemia: Secondary | ICD-10-CM | POA: Diagnosis not present

## 2024-07-13 DIAGNOSIS — Z7982 Long term (current) use of aspirin: Secondary | ICD-10-CM | POA: Diagnosis not present

## 2024-07-13 DIAGNOSIS — I130 Hypertensive heart and chronic kidney disease with heart failure and stage 1 through stage 4 chronic kidney disease, or unspecified chronic kidney disease: Secondary | ICD-10-CM | POA: Insufficient documentation

## 2024-07-13 DIAGNOSIS — I428 Other cardiomyopathies: Secondary | ICD-10-CM | POA: Insufficient documentation

## 2024-07-13 DIAGNOSIS — R0609 Other forms of dyspnea: Secondary | ICD-10-CM | POA: Insufficient documentation

## 2024-07-13 DIAGNOSIS — N1831 Chronic kidney disease, stage 3a: Secondary | ICD-10-CM | POA: Insufficient documentation

## 2024-07-13 DIAGNOSIS — Z79899 Other long term (current) drug therapy: Secondary | ICD-10-CM | POA: Diagnosis not present

## 2024-07-13 DIAGNOSIS — E1122 Type 2 diabetes mellitus with diabetic chronic kidney disease: Secondary | ICD-10-CM | POA: Insufficient documentation

## 2024-07-13 DIAGNOSIS — Z7984 Long term (current) use of oral hypoglycemic drugs: Secondary | ICD-10-CM | POA: Diagnosis not present

## 2024-07-13 DIAGNOSIS — I2510 Atherosclerotic heart disease of native coronary artery without angina pectoris: Secondary | ICD-10-CM | POA: Insufficient documentation

## 2024-07-13 DIAGNOSIS — Z96612 Presence of left artificial shoulder joint: Secondary | ICD-10-CM | POA: Insufficient documentation

## 2024-07-13 MED ORDER — TRULICITY 1.5 MG/0.5ML ~~LOC~~ SOAJ
1.5000 mg | SUBCUTANEOUS | 5 refills | Status: AC
  Filled 2024-07-13: qty 2, 28d supply, fill #0
  Filled 2024-08-06: qty 2, 28d supply, fill #1
  Filled 2024-09-04: qty 2, 28d supply, fill #2
  Filled 2024-10-01: qty 2, 28d supply, fill #3

## 2024-07-13 MED ORDER — ISOSORBIDE MONONITRATE ER 30 MG PO TB24
45.0000 mg | ORAL_TABLET | Freq: Every day | ORAL | 3 refills | Status: AC
Start: 2024-07-13 — End: ?

## 2024-07-13 NOTE — Patient Instructions (Addendum)
 Good to see you today!   INCREASE Imdur  to 45 mg ( 1 1/2 tablets) Daily   Your physician has requested that you have an echocardiogram. Echocardiography is a painless test that uses sound waves to create images of your heart. It provides your doctor with information about the size and shape of your heart and how well your heart's chambers and valves are working. This procedure takes approximately one hour. There are no restrictions for this procedure. Please do NOT wear cologne, perfume, aftershave, or lotions (deodorant is allowed). Please arrive 15 minutes prior to your appointment time.  Your physician recommends that you schedule a follow-up appointment  2 months with echocardiogram (January) Call office in beginning of December to schedule an appointment  If you have any questions or concerns before your next appointment please send us  a message through [place] or call our office at [phone].    TO LEAVE A MESSAGE FOR THE NURSE SELECT OPTION 2, PLEASE LEAVE A MESSAGE INCLUDING: YOUR NAME DATE OF BIRTH CALL BACK NUMBER REASON FOR CALL**this is important as we prioritize the call backs  YOU WILL RECEIVE A CALL BACK THE SAME DAY AS LONG AS YOU CALL BEFORE 4:00 PM At the [facility], you and your health needs are our priority. As part of our continuing mission to provide you with exceptional heart care, we have created designated Provider Care Teams. These Care Teams include your primary Cardiologist (physician) and Advanced Practice Providers (APPs- Physician Assistants and Nurse Practitioners) who all work together to provide you with the care you need, when you need it.   You may see any of the following providers on your designated Care Team at your next follow up: Dr Toribio Fuel Dr Ezra Shuck Dr. Morene Brownie Greig Mosses, NP Caffie Shed, [place] [facility] [place], [place] Beckey Coe, NP Jordan Lee, NP Ellouise Class, NP Tinnie Redman,  PharmD Jaun Bash, PharmD   Please be sure to bring in all your medications bottles to every appointment.    Thank you for choosing [facility]

## 2024-07-13 NOTE — Progress Notes (Signed)
 [facility] Note  PCP: Virgia Dorothe BRAVO, MD Nephrology: Dr Fleurette ([facility])  HF Cardiologist: Dr. Cherrie   Reason for visit: f/u for systolic heart failure   HPI:  [name] is a 63 y.o. female with a past medical history of HTN, chronic systolic HF due to NICM (normal cors on cath 2014, nonobs CAD on cath 5/21) s/p CRT-D ([employer]), atrial tachycardia (2017), CKD stage III, DM, OSA w/ CPAP.   Admitted 4/18 with ADHF at [facility]. Echo that admission with EF 25-30%.   Has angioedema with ACE-I and cough with ARB.    Echo 3/20 LVEF 25-30%   Admitted 5/21 LHC showed moderate, nonobstructive CAD w/ 50-60% RCA, flow wire 1.0 (non-flow limiting). Medical therapy recommended.  Echo 12/21 EF 25-30%   Echo 6/23 EF 35-40% (outside echo - images unavailable)  ICD changed in 1/22.  Echo 06/27/23 EF 35-40%   Low dose spiro started 10/24 but SCr 1.5>>2.1 and K 5.8 => spiro stopped.   She underwent left total shoulder replacement 9/25.   She was seen in the ED last wk for sharp left sided pleuritic chest pain. EKG, HS trop and CP reassuring. CT angio negative for PE. Pain noted to resolve in the ED w/o intervention. Pain was felt to be musculoskeletal.   Today she returns for HF follow up. Doing better. No current CP but has had some occasional short lived exertional CP in recent months. The chest pain that prompted her to the ED does sound most c/w musculoskeletal etiology. She is still recovering from total shoulder replacement and still undergoing extensive PT and it sounds like she may have pulled a muscle during therapy and she has had point tenderness along lateral left chest wall that's reproducible.  She has mild exertional dyspnea, NYHA Class II, but reports stable symptoms. Device interrogation shows normal impedence. Pro BNP and CXR in the ED last wk also were normal. CXR w/o evidence of congestion/edema.   BP today is mildly elevated at  142/72.    Review of systems complete and found to be negative unless listed in HPI.    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   CAD (coronary artery disease) 09/01/2013   CHF (congestive heart failure) (HCC) 06/07/2015   CKD (chronic kidney disease), stage III (HCC) 12/09/2013   Depression 07/27/2013   Diabetes mellitus without complication (HCC) 09/27/2014   GERD (gastroesophageal reflux disease) 07/27/2013   Gout 12/29/2013   Heart valve disorder 06/03/2013   High cholesterol 11/10/2013   Hypertension 11/10/2013   IBS (irritable bowel syndrome) 09/27/2014   Myocardial infarct [facility])    Sleep apnea 10/13/2013   Vascular disorder 01/12/2014   Current Outpatient Medications  Medication Sig Dispense Refill   allopurinol  (ZYLOPRIM ) 100 MG tablet Take 200 mg by mouth daily.      amiodarone  (PACERONE ) 200 MG tablet Take 0.5 tablets (100 mg total) by mouth daily. 90 tablet 3   aspirin  81 MG chewable tablet Chew 81 mg by mouth daily.     atorvastatin  (LIPITOR) 80 MG tablet Take 1 tablet (80 mg total) by mouth at bedtime. 90 tablet 3   Calcium  Carbonate-Vitamin D 600-400 MG-UNIT tablet Take 1 tablet by mouth 2 (two) times daily.     carvedilol  (COREG ) 25 MG tablet Take 1 tablet (25 mg total) by mouth 2 (two) times daily with a meal. 180 tablet 3  colchicine  0.6 MG tablet Take 0.6 mg by mouth 2 (two) times daily as needed (Gout).     diclofenac  Sodium (VOLTAREN  ARTHRITIS PAIN) 1 % GEL Apply 1 g topically as needed.     Dulaglutide  (TRULICITY ) 1.5 MG/0.5ML SOAJ Inject 1.5 mg into the skin once a week. 2 mL 5   empagliflozin  (JARDIANCE ) 10 MG TABS tablet Take 1 tablet (10 mg total) by mouth daily before breakfast. 90 tablet 3   ferrous sulfate  325 (65 FE) MG tablet Take 1 tablet (325 mg total) by mouth 3 (three) times daily. 30 tablet 0   hydrALAZINE  (APRESOLINE ) 25 MG tablet Take 0.5  tablets (12.5 mg total) by mouth 3 (three) times daily. 135 tablet 3   isosorbide  mononitrate (IMDUR ) 30 MG 24 hr tablet Take 1 tablet (30 mg total) by mouth daily. 90 tablet 3   Magnesium Oxide 420 MG TABS Take 840 mg by mouth daily.     Multiple Vitamin (MULTIVITAMIN) tablet Take 1 tablet by mouth daily.     omeprazole (PRILOSEC) 40 MG capsule Take 40 mg by mouth daily.     potassium chloride  SA (KLOR-CON  M) 20 MEQ tablet Take 3 tablets (60 mEq total) by mouth every morning AND 2 tablets (40 mEq total) every evening.     rOPINIRole  (REQUIP ) 0.5 MG tablet Take 0.5 mg by mouth at bedtime.      sertraline  (ZOLOFT ) 100 MG tablet Patient takes 2 tablet at bedtime. (Patient taking differently: Take 200 mg by mouth at bedtime. Patient takes 2 tablet at bedtime.)     torsemide  (DEMADEX ) 20 MG tablet Take 3 tablets (60 mg total) by mouth 2 (two) times daily.     No current facility-administered medications for this encounter.   Allergies  Allergen Reactions   Codeine Itching   Gabapentin Itching   Haldol [Haloperidol Lactate] Other (See Comments)    Dizziness, hallucinations   Levofloxacin     Other reaction(s): Other (See Comments) Projectile vomiting    Lisinopril Itching and Swelling   Losartan Itching   Morphine And Codeine Nausea And Vomiting   Penicillins Nausea And Vomiting    Vomiting, upset stomach, Headache Has patient had a PCN reaction causing immediate rash, facial/tongue/throat swelling, SOB or lightheadedness with hypotension: No Has patient had a PCN reaction causing severe rash involving mucus membranes or skin necrosis: No Has patient had a PCN reaction that required hospitalization: No Has patient had a PCN reaction occurring within the last 10 years: No If all of the above answers are NO, then may proceed with Cephalosporin use.    Haloperidol Other (See Comments)   Other Other (See Comments)   Oxycodone Other (See Comments)   Social History   Socioeconomic  History   Marital status: Single    Spouse name: Not on file   Number of children: Not on file   Years of education: Not on file   Highest education level: Not on file  Occupational History   Not on file  Tobacco Use   Smoking status: Never   Smokeless tobacco: Never  Vaping Use   Vaping status: Never Used  Substance and Sexual Activity   Alcohol use: No   Drug use: No   Sexual activity: Yes  Other Topics Concern   Not on file  Social History Narrative   Not on file   Social Drivers of Health   Financial Resource Strain: Not on file  Food Insecurity: Not on file  Transportation Needs: Not on file  Physical Activity: Not on file  Stress: Not on file  Social Connections: Not on file  Intimate Partner Violence: Not on file   Family History  Problem Relation Age of Onset   Hypertension Mother    BP (!) 142/72   Pulse 66   Ht 5' 3 (1.6 m)   Wt 74.8 kg (165 lb)   SpO2 96%   BMI 29.23 kg/m   Wt Readings from Last 3 Encounters:  07/13/24 74.8 kg (165 lb)  07/03/24 71.2 kg (157 lb)  07/03/24 71.2 kg (157 lb)   Physical Exam  Vitals:   07/13/24 1218  BP: (!) 142/72  Pulse: 66  SpO2: 96%   GENERAL: NAD Lungs- clear  CARDIAC:  JVP not elevated           Normal rate with regular rhythm. No murmur.  No edema.  ABDOMEN: Soft, non-tender, non-distended.  EXTREMITIES: Warm and well perfused.  NEUROLOGIC: No obvious FND   ECG (personally reviewed): not performed   ASSESSMENT & PLAN: 1. Chronic Systolic Heart Faliure:  - NICM, normal cors in 2014, likely related to HTN vs. Noncompliance vs. Tachy- medicated with history of atrial tach. - s/p STJ CRT-D - Echo 6/18: EF 20%, moderate MR, moderate TR. PA pressure 80 mm Hg.    - Echo 3/20: EF 25-30% - Cath 5/21: Nonobstructive CAD RCA 60% lesion - Echo 12/21: EF 25-30% RV ok  - Echo 6/23: EF 35-40% (outside echo - images unavailable) - Echo 10/24: EF 35-40%  - NYHA II, stable. Euvolemic on exam and device  interrogation. Normal impedence/fluid index < threshold  - Continue torsemide  60 mg bid + KCL 60/60 - Continue hydralazine  12.5 mg tid  - Increase Imdur  to 45 mg daily for further afterload reduction and added antianginal benifit. - Continue carvedilol  25 mg bid.  - Continue Jardiance  10 mg daily. .  - No ARB/ ANRi due to angioedema/CKD. Had itching with losartan - Off spiro with hyperkalemia and AKI - reviewed BMP from ED visit last wk. SCr/K ok  - Update echo at next visit.  2. CAD, non-obstructive - LHC 01/2020 50-60% RCA, negative FFR (non-flow limiting). - no current CP  - Continue  ASA + statin + Imdur  + ? blocker.  3. HLD - Continue atorva 80 mg daily. - Goal LDL < 55 - Followed by PCP.  4. CKD stage IIIa  - Baseline SCr 1.5-1.8 - Continue SGLT2i - Follows with Dr. Fleurette - recent labs showed stable SCr   5. HTN - mod elevated - increase Imdur  to 45 mg daily   - continue hydral and Coreg  at current doses   6. History of atrial tachycardia - Followed by Dr. Inocencio  - Continue amiodarone  100 mg daily. Will need regular eye exam  Follow up in 3 months with Dr. Cherrie + echo.  Caffie Shed, PA-C 07/13/24

## 2024-07-20 ENCOUNTER — Ambulatory Visit: Attending: Cardiology

## 2024-07-20 DIAGNOSIS — Z95810 Presence of automatic (implantable) cardiac defibrillator: Secondary | ICD-10-CM | POA: Diagnosis not present

## 2024-07-20 DIAGNOSIS — I5022 Chronic systolic (congestive) heart failure: Secondary | ICD-10-CM | POA: Diagnosis not present

## 2024-07-20 NOTE — Progress Notes (Signed)
 EPIC Encounter for ICM Monitoring  Patient Name: Michelle Andrade is a 63 y.o. female Date: 07/20/2024 Primary Care Physican: Virgia Dorothe BRAVO, MD Primary Cardiologist: Bensimhon Electrophysiologist: Camnitz Nephrologist: Dr Fleurette Bi-V Pacing: >99%          05/17/2023 Weight: 171 lbs 07/23/2023 Weight: 156 lbs 02/18/2024 Weight: 163.6 lbs lbs today but after she has eaten and clothes on.  (baseline 157-158 lbs) 02/21/2024 Weight: 157 lbs 03/30/2024 Office Weight: 165 lbs 05/05/2024 Weight: 159 lbs  (baseline 157-158 lbs) 06/15/2024 Weight: 159 lbs                                                           Spoke with patient and heart failure questions reviewed.  Transmission results reviewed.  Pt has a cold and taking a z pack and antibiotics.    07/03/2024 ED Visit with chest pain but was a pulled muscle in her chest.    Diet:  N/A   Since 06/15/2024 ICM Remote Transmission:  CorVue thoracic impedance suggesting normal fluid levels.    Prescribed:  Torsemide  20 mg take 3 tablets (60 mg total) by mouth twice a day.   Potassium 20 mEq take 3 tablet (60 mEq total) by mouth every morning and 2 tablets (40 mEq total) every evening.    Labs: 07/03/2024 Creatinine 1.63, BUN 20, Potassium 4.2, Sodium 137, GFR 35 03/30/2024 Creatinine 1.70, BUN 50, Potassium 3.9, Sodium 140, GFR 33 02/26/2024 Creatinine 1.69, BUN 18, Potassium 4.2, Sodium 140, GFR 33 12/05/2023 Creatinine 1.69, BUN 15, Potassium 4.1, Sodium 140, GFR 34 12/02/2023 Creatinine 1.67, BUN 18, Potassium 4.0, Sodium 139, GFR 34 A complete set of results can be found in Results Review.   Recommendations:  No changes and encouraged to call if experiencing any fluid symptoms.   Follow-up plan: ICM clinic phone appointment on 08/24/2024.   91 day device clinic remote transmission 09/07/2024.     EP/Cardiology Office Visits:   08/13/2024 with Charlies Arthur, PA.    Recall 09/14/2024 with Dr Zenaida.    Nephrologist monitoring kidney function.    Copy of ICM check sent to Dr. Inocencio.    Remote monitoring is medically necessary for Heart Failure Management.    Daily Thoracic Impedance ICM trend: 04/22/2024 through 07/19/2024.     [name], RN 07/20/2024 7:48 AM

## 2024-07-23 ENCOUNTER — Encounter: Payer: Self-pay | Admitting: Student

## 2024-08-06 ENCOUNTER — Other Ambulatory Visit (HOSPITAL_COMMUNITY): Payer: Self-pay

## 2024-08-10 NOTE — Progress Notes (Unsigned)
  Cardiology Office Note:  .   Date:  08/10/2024  ID:  Michelle Andrade, DOB [birth year], MRN [id number] PCP: Virgia Dorothe BRAVO, MD  [facility] Providers Cardiologist:  Toribio Fuel, MD Electrophysiologist:  Soyla Gladis Norton, MD {  History of Present Illness: Michelle Andrade   Michelle Andrade is a 63 y.o. female w/PMHx of  HTN, DM, CKD (III) Chronic CHF (systolic) NICM ATach  She saw Dr. Norton 02/27/24, doing wwll, no reported symptoms, normal device function Planned for annual visit  Seeing HF team regularly, last on 07/13/24, some CP felt to be MSK. 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 Left ventricular ejection fraction, by estimation, is 35 to 40%. The  left ventricle has moderately decreased function. The left ventricle  demonstrates regional wall motion abnormalities (see scoring  diagram/findings for description). Left ventricular   diastolic parameters are consistent with Grade I diastolic dysfunction  (impaired relaxation). The average left ventricular global longitudinal  strain is -12.6 %. The global longitudinal strain is abnormal.   2. Right ventricular systolic function is normal. The right ventricular  size is normal. There is normal pulmonary artery systolic pressure.   3. There is no evidence of cardiac tamponade.   4. The mitral valve is  normal in structure. Trivial mitral valve  regurgitation. No evidence of mitral stenosis. There is mild prolapse of  posterior of the mitral valve.   5. The aortic valve is tricuspid. Aortic valve regurgitation is not  visualized. No aortic stenosis is present.   6. The inferior vena cava is normal in size with greater than 50%  respiratory variability, suggesting right atrial pressure of 3 mmHg.    Risk Assessment/Calculations:    Physical Exam:   VS:  There were no vitals taken for this visit.   Wt Readings from Last 3 Encounters:  07/13/24 165 lb (74.8 kg)  07/03/24 157 lb (71.2 kg)  07/03/24 157 lb (71.2 kg)    GEN: Well nourished, well developed in no acute distress NECK: No JVD; No carotid bruits CARDIAC: ***RRR, no murmurs, rubs, gallops RESPIRATORY:  *** CTA b/l without rales, wheezing or rhonchi  ABDOMEN: Soft, non-tender, non-distended EXTREMITIES: *** No edema; No deformity   ICD site: *** is stable, no thinning, fluctuation, tethering  ASSESSMENT AND PLAN: .    ICD *** intact function *** no programming changes made EKG looks good  ATach Chronic amiodarone  *** burden  NICM *** C/w Dr. Boone    {Are you ordering a CV Procedure (e.g. stress test, cath, DCCV, TEE, etc)?   Press F2        :[id number]}     Dispo: ***  Signed, Charlies Macario Arthur, PA-C

## 2024-08-13 ENCOUNTER — Ambulatory Visit: Attending: Physician Assistant | Admitting: Physician Assistant

## 2024-08-13 ENCOUNTER — Ambulatory Visit: Payer: Self-pay | Admitting: Cardiology

## 2024-08-13 VITALS — BP 124/66 | HR 74 | Ht 63.0 in | Wt 169.0 lb

## 2024-08-13 DIAGNOSIS — Z95810 Presence of automatic (implantable) cardiac defibrillator: Secondary | ICD-10-CM | POA: Diagnosis not present

## 2024-08-13 DIAGNOSIS — I4719 Other supraventricular tachycardia: Secondary | ICD-10-CM

## 2024-08-13 DIAGNOSIS — I5022 Chronic systolic (congestive) heart failure: Secondary | ICD-10-CM

## 2024-08-13 DIAGNOSIS — Z79899 Other long term (current) drug therapy: Secondary | ICD-10-CM

## 2024-08-13 DIAGNOSIS — I428 Other cardiomyopathies: Secondary | ICD-10-CM | POA: Diagnosis not present

## 2024-08-13 LAB — CUP PACEART INCLINIC DEVICE CHECK
Battery Remaining Longevity: 45 mo
Brady Statistic RA Percent Paced: 5.2 %
Brady Statistic RV Percent Paced: 99.79 %
Date Time Interrogation Session: 20251211105455
HighPow Impedance: 68.625
Implantable Lead Connection Status: 753985
Implantable Lead Connection Status: 753985
Implantable Lead Connection Status: 753985
Implantable Lead Implant Date: 20150915
Implantable Lead Implant Date: 20150915
Implantable Lead Implant Date: 20150915
Implantable Lead Location: 753858
Implantable Lead Location: 753859
Implantable Lead Location: 753860
Implantable Pulse Generator Implant Date: 20220107
Lead Channel Impedance Value: 375 Ohm
Lead Channel Impedance Value: 425 Ohm
Lead Channel Impedance Value: 450 Ohm
Lead Channel Pacing Threshold Amplitude: 0.625 V
Lead Channel Pacing Threshold Amplitude: 0.75 V
Lead Channel Pacing Threshold Amplitude: 0.75 V
Lead Channel Pacing Threshold Amplitude: 1.125 V
Lead Channel Pacing Threshold Pulse Width: 0.4 ms
Lead Channel Pacing Threshold Pulse Width: 0.4 ms
Lead Channel Pacing Threshold Pulse Width: 0.4 ms
Lead Channel Pacing Threshold Pulse Width: 0.4 ms
Lead Channel Sensing Intrinsic Amplitude: 12 mV
Lead Channel Sensing Intrinsic Amplitude: 3.1 mV
Lead Channel Setting Pacing Amplitude: 1.625
Lead Channel Setting Pacing Amplitude: 2.125
Lead Channel Setting Pacing Amplitude: 2.25 V
Lead Channel Setting Pacing Pulse Width: 0.4 ms
Lead Channel Setting Pacing Pulse Width: 0.4 ms
Lead Channel Setting Sensing Sensitivity: 0.5 mV
Pulse Gen Serial Number: 111036830

## 2024-08-13 LAB — HEPATIC FUNCTION PANEL
ALT: 24 IU/L (ref 0–32)
AST: 35 IU/L (ref 0–40)
Albumin: 4 g/dL (ref 3.9–4.9)
Alkaline Phosphatase: 183 IU/L — ABNORMAL HIGH (ref 49–135)
Bilirubin Total: 0.2 mg/dL (ref 0.0–1.2)
Bilirubin, Direct: 0.08 mg/dL (ref 0.00–0.40)
Total Protein: 7.1 g/dL (ref 6.0–8.5)

## 2024-08-13 LAB — TSH: TSH: 3.16 u[IU]/mL (ref 0.450–4.500)

## 2024-08-13 NOTE — Patient Instructions (Addendum)
 Medication Instructions:    STOP TAKING :  AMIODARONE     *If you need a refill on your cardiac medications before your next appointment, please call your pharmacy*    Lab Work:   PLEASE GO DOWN STAIRS  [facility]  FIRST FLOOR   ( GET OFF ELEVATORS WALK TOWARDS WAITING AREA LAB LOCATED BY PHARMACY):  LFT AND TSH  TODAY        If you have labs (blood work) drawn today and your tests are completely normal, you will receive your results only by: MyChart Message (if you have MyChart) OR A paper copy in the mail If you have any lab test that is abnormal or we need to change your treatment, we will call you to review the results.    Testing/Procedures: NONE ORDERED  TODAY     Follow-Up: At [facility], you and your health needs are our priority.  As part of our continuing mission to provide you with exceptional heart care, our providers are all part of one team.  This team includes your primary Cardiologist (physician) and Advanced Practice Providers or APPs (Physician Assistants and Nurse Practitioners) who all work together to provide you with the care you need, when you need it.    Your next appointment:     1 year(s)   Provider:    You may see Will Gladis Norton, MD or one of the following Advanced Practice Providers on your designated Care Team:   Charlies Arthur, [place]   We recommend signing up for the patient portal called MyChart.  Sign up information is provided on this After Visit Summary.  MyChart is used to connect with patients for Virtual Visits (Telemedicine).  Patients are able to view lab/test results, encounter notes, upcoming appointments, etc.  Non-urgent messages can be sent to your provider as well.   To learn more about what you can do with MyChart, go to [email or web address].   Other Instructions

## 2024-08-13 NOTE — Addendum Note (Signed)
 Addended by: RUBBIE KERRI HERO on: 08/13/2024 09:12 AM   Modules accepted: Orders

## 2024-08-14 ENCOUNTER — Encounter (HOSPITAL_COMMUNITY): Payer: Self-pay | Admitting: Internal Medicine

## 2024-08-14 NOTE — Telephone Encounter (Signed)
 Message routed as Lorain Childes

## 2024-08-24 ENCOUNTER — Ambulatory Visit: Attending: Cardiology

## 2024-08-24 ENCOUNTER — Telehealth: Payer: Self-pay

## 2024-08-24 DIAGNOSIS — I5022 Chronic systolic (congestive) heart failure: Secondary | ICD-10-CM

## 2024-08-24 DIAGNOSIS — Z95810 Presence of automatic (implantable) cardiac defibrillator: Secondary | ICD-10-CM | POA: Diagnosis not present

## 2024-08-24 NOTE — Telephone Encounter (Signed)
 Remote ICM transmission received.  Attempted call to patient regarding ICM remote transmission and no answer.

## 2024-08-24 NOTE — Progress Notes (Signed)
 EPIC Encounter for ICM Monitoring  Patient Name: Michelle Andrade is a 63 y.o. female Date: 08/24/2024 Primary Care Physican: Virgia Dorothe BRAVO, MD Primary Cardiologist: Bensimhon Electrophysiologist: Camnitz Nephrologist: Dr Fleurette Bi-V Pacing: >99%          05/17/2023 Weight: 171 lbs 07/23/2023 Weight: 156 lbs 02/18/2024 Weight: 163.6 lbs lbs today but after she has eaten and clothes on.  (baseline 157-158 lbs) 02/21/2024 Weight: 157 lbs 03/30/2024 Office Weight: 165 lbs 05/05/2024 Weight: 159 lbs  (baseline 157-158 lbs) 06/15/2024 Weight: 159 lbs  08/13/2024 Office Visit: 169 lbs                                                          Attempted call to patient and unable to reach.  Transmission results reviewed.    Diet:  N/A   Since 07/20/2024 ICM Remote Transmission:  CorVue thoracic impedance suggesting normal fluid levels with the exception of possible fluid accumulation from 08/11/2024-08/21/2024.   Prescribed:  Torsemide  20 mg take 3 tablets (60 mg total) by mouth twice a day.   Potassium 20 mEq take 3 tablet (60 mEq total) by mouth every morning and 2 tablets (40 mEq total) every evening.    Labs: 07/03/2024 Creatinine 1.63, BUN 20, Potassium 4.2, Sodium 137, GFR 35 03/30/2024 Creatinine 1.70, BUN 50, Potassium 3.9, Sodium 140, GFR 33 02/26/2024 Creatinine 1.69, BUN 18, Potassium 4.2, Sodium 140, GFR 33 12/05/2023 Creatinine 1.69, BUN 15, Potassium 4.1, Sodium 140, GFR 34 12/02/2023 Creatinine 1.67, BUN 18, Potassium 4.0, Sodium 139, GFR 34 A complete set of results can be found in Results Review.   Recommendations:  Unable to reach.     Follow-up plan: ICM clinic phone appointment on 09/28/2024.   91 day device clinic remote transmission 09/07/2024.     EP/Cardiology Office Visits:  Recall 08/08/2025 with Dr Inocencio or Charlies Arthur, PA.    Recall 09/14/2024 with Dr Zenaida.    Nephrologist monitoring kidney function.   Copy of ICM check sent to Dr. Inocencio.    Remote monitoring is  medically necessary for Heart Failure Management.    Daily Thoracic Impedance ICM trend: 05/26/2024 through 08/24/2024.    12-14 Month Thoracic Impedance ICM trend:     [name], RN 08/24/2024 11:20 AM

## 2024-09-04 ENCOUNTER — Other Ambulatory Visit: Payer: Self-pay

## 2024-09-07 ENCOUNTER — Ambulatory Visit: Payer: Medicare Other

## 2024-09-07 DIAGNOSIS — I5022 Chronic systolic (congestive) heart failure: Secondary | ICD-10-CM

## 2024-09-08 LAB — CUP PACEART REMOTE DEVICE CHECK
Battery Remaining Longevity: 44 mo
Battery Remaining Percentage: 48 %
Battery Voltage: 2.95 V
Brady Statistic AP VP Percent: 3.6 %
Brady Statistic AP VS Percent: 1 %
Brady Statistic AS VP Percent: 96 %
Brady Statistic AS VS Percent: 1 %
Brady Statistic RA Percent Paced: 3.5 %
Date Time Interrogation Session: 20260105020015
HighPow Impedance: 72 Ohm
Implantable Lead Connection Status: 753985
Implantable Lead Connection Status: 753985
Implantable Lead Connection Status: 753985
Implantable Lead Implant Date: 20150915
Implantable Lead Implant Date: 20150915
Implantable Lead Implant Date: 20150915
Implantable Lead Location: 753858
Implantable Lead Location: 753859
Implantable Lead Location: 753860
Implantable Pulse Generator Implant Date: 20220107
Lead Channel Impedance Value: 390 Ohm
Lead Channel Impedance Value: 430 Ohm
Lead Channel Impedance Value: 480 Ohm
Lead Channel Pacing Threshold Amplitude: 0.625 V
Lead Channel Pacing Threshold Amplitude: 0.75 V
Lead Channel Pacing Threshold Amplitude: 1.25 V
Lead Channel Pacing Threshold Pulse Width: 0.4 ms
Lead Channel Pacing Threshold Pulse Width: 0.4 ms
Lead Channel Pacing Threshold Pulse Width: 0.4 ms
Lead Channel Sensing Intrinsic Amplitude: 12 mV
Lead Channel Sensing Intrinsic Amplitude: 3.7 mV
Lead Channel Setting Pacing Amplitude: 1.625
Lead Channel Setting Pacing Amplitude: 2.25 V
Lead Channel Setting Pacing Amplitude: 2.25 V
Lead Channel Setting Pacing Pulse Width: 0.4 ms
Lead Channel Setting Pacing Pulse Width: 0.4 ms
Lead Channel Setting Sensing Sensitivity: 0.5 mV
Pulse Gen Serial Number: 111036830

## 2024-09-09 ENCOUNTER — Ambulatory Visit: Payer: Self-pay | Admitting: Cardiology

## 2024-09-10 NOTE — Progress Notes (Signed)
 Remote ICD Transmission

## 2024-09-28 ENCOUNTER — Ambulatory Visit: Attending: Cardiology

## 2024-09-28 DIAGNOSIS — I5022 Chronic systolic (congestive) heart failure: Secondary | ICD-10-CM

## 2024-09-28 DIAGNOSIS — Z95810 Presence of automatic (implantable) cardiac defibrillator: Secondary | ICD-10-CM | POA: Diagnosis not present

## 2024-09-29 ENCOUNTER — Encounter (HOSPITAL_COMMUNITY): Payer: Self-pay | Admitting: Internal Medicine

## 2024-09-29 NOTE — Progress Notes (Unsigned)
 EPIC Encounter for ICM Monitoring  Patient Name: Mistie Adney is a 64 y.o. female Date: 09/29/2024 Primary Care Physican: Virgia Dorothe BRAVO, MD Primary Cardiologist: Bensimhon Electrophysiologist: Camnitz Nephrologist: Dr Fleurette Bi-V Pacing: >99%          05/17/2023 Weight: 171 lbs 07/23/2023 Weight: 156 lbs 02/18/2024 Weight: 163.6 lbs lbs today but after she has eaten and clothes on.  (baseline 157-158 lbs) 02/21/2024 Weight: 157 lbs 03/30/2024 Office Weight: 165 lbs 05/05/2024 Weight: 159 lbs  (baseline 157-158 lbs) 06/15/2024 Weight: 159 lbs  08/13/2024 Office Visit: 169 lbs                                                          Attempted call to patient and unable to reach.  Transmission results reviewed.     Diet:  N/A   Since 08/24/2024 ICM Remote Transmission:  CorVue thoracic impedance suggesting normal fluid levels.   Prescribed:  Torsemide  20 mg take 3 tablets (60 mg total) by mouth twice a day.   Potassium 20 mEq take 3 tablet (60 mEq total) by mouth every morning and 2 tablets (40 mEq total) every evening.    Labs: 09/11/2024 Creatinine 1.50, BUN 20, Potassium 5.1, Sodium 138, GFR 38 07/03/2024 Creatinine 1.63, BUN 20, Potassium 4.2, Sodium 137, GFR 35 03/30/2024 Creatinine 1.70, BUN 50, Potassium 3.9, Sodium 140, GFR 33 02/26/2024 Creatinine 1.69, BUN 18, Potassium 4.2, Sodium 140, GFR 33 12/05/2023 Creatinine 1.69, BUN 15, Potassium 4.1, Sodium 140, GFR 34 12/02/2023 Creatinine 1.67, BUN 18, Potassium 4.0, Sodium 139, GFR 34 A complete set of results can be found in Results Review.   Recommendations: Unable to reach.     Follow-up plan: ICM clinic 31 day follow up currently on hold but 91 day remote monitoring will continue.     91 day device clinic remote transmission 12/07/2024.     EP/Cardiology Office Visits:  Recall 08/08/2025 with Dr Inocencio or Charlies Arthur, PA.    Recall 09/14/2024 with Dr Zenaida.    Nephrologist monitoring kidney function.   Copy of ICM check  sent to Dr. Inocencio.    Remote monitoring is medically necessary for Heart Failure Management.    Daily Thoracic Impedance ICM trend: 06/30/2024 through 09/28/2024.    12-14 Month Thoracic Impedance ICM trend:     [name], RN 09/29/2024 8:03 AM

## 2024-10-01 ENCOUNTER — Other Ambulatory Visit: Payer: Self-pay

## 2024-10-29 ENCOUNTER — Ambulatory Visit

## 2024-12-18 ENCOUNTER — Ambulatory Visit (HOSPITAL_COMMUNITY): Admitting: Internal Medicine

## 2024-12-18 ENCOUNTER — Other Ambulatory Visit (HOSPITAL_COMMUNITY)
